# Patient Record
Sex: Female | Born: 1965 | Race: Black or African American | Hispanic: No | Marital: Single | Smoking: Never smoker
Health system: Southern US, Community
[De-identification: ages and names within clinical notes are randomized; demographics above are authoritative.]

## PROBLEM LIST (undated history)

## (undated) DIAGNOSIS — I10 Essential (primary) hypertension: Secondary | ICD-10-CM

## (undated) DIAGNOSIS — E119 Type 2 diabetes mellitus without complications: Secondary | ICD-10-CM

## (undated) DIAGNOSIS — I509 Heart failure, unspecified: Secondary | ICD-10-CM

## (undated) DIAGNOSIS — E079 Disorder of thyroid, unspecified: Secondary | ICD-10-CM

## (undated) DIAGNOSIS — R011 Cardiac murmur, unspecified: Secondary | ICD-10-CM

## (undated) DIAGNOSIS — I4891 Unspecified atrial fibrillation: Secondary | ICD-10-CM

## (undated) DIAGNOSIS — M329 Systemic lupus erythematosus, unspecified: Secondary | ICD-10-CM

## (undated) DIAGNOSIS — E0500 Thyrotoxicosis with diffuse goiter without thyrotoxic crisis or storm: Secondary | ICD-10-CM

## (undated) DIAGNOSIS — E039 Hypothyroidism, unspecified: Secondary | ICD-10-CM

## (undated) DIAGNOSIS — E0590 Thyrotoxicosis, unspecified without thyrotoxic crisis or storm: Secondary | ICD-10-CM

## (undated) DIAGNOSIS — D649 Anemia, unspecified: Secondary | ICD-10-CM

## (undated) DIAGNOSIS — IMO0002 Reserved for concepts with insufficient information to code with codable children: Secondary | ICD-10-CM

## (undated) HISTORY — PX: TONSILLECTOMY: SUR1361

## (undated) HISTORY — DX: Thyrotoxicosis with diffuse goiter without thyrotoxic crisis or storm: E05.00

## (undated) HISTORY — PX: ABLATION: SHX5711

---

## 2013-09-04 ENCOUNTER — Other Ambulatory Visit: Payer: Self-pay | Admitting: Oncology

## 2015-09-18 HISTORY — PX: COLONOSCOPY: SHX174

## 2020-04-03 HISTORY — PX: FOOT SURGERY: SHX648

## 2020-08-31 ENCOUNTER — Other Ambulatory Visit: Payer: Self-pay

## 2020-08-31 ENCOUNTER — Inpatient Hospital Stay (HOSPITAL_COMMUNITY)
Admission: EM | Admit: 2020-08-31 | Discharge: 2020-09-18 | DRG: 644 | Disposition: A | Discharge status: Skilled Nursing Facility | Payer: Medicaid Other | Attending: Internal Medicine | Admitting: Internal Medicine

## 2020-08-31 ENCOUNTER — Inpatient Hospital Stay (HOSPITAL_COMMUNITY): Payer: Medicaid Other

## 2020-08-31 ENCOUNTER — Emergency Department (HOSPITAL_COMMUNITY): Payer: Medicaid Other

## 2020-08-31 ENCOUNTER — Encounter (HOSPITAL_COMMUNITY): Payer: Self-pay | Admitting: *Deleted

## 2020-08-31 DIAGNOSIS — L03115 Cellulitis of right lower limb: Secondary | ICD-10-CM | POA: Diagnosis present

## 2020-08-31 DIAGNOSIS — L97919 Non-pressure chronic ulcer of unspecified part of right lower leg with unspecified severity: Secondary | ICD-10-CM | POA: Diagnosis not present

## 2020-08-31 DIAGNOSIS — E11621 Type 2 diabetes mellitus with foot ulcer: Secondary | ICD-10-CM | POA: Diagnosis present

## 2020-08-31 DIAGNOSIS — I350 Nonrheumatic aortic (valve) stenosis: Secondary | ICD-10-CM | POA: Diagnosis present

## 2020-08-31 DIAGNOSIS — I4891 Unspecified atrial fibrillation: Secondary | ICD-10-CM | POA: Diagnosis present

## 2020-08-31 DIAGNOSIS — A419 Sepsis, unspecified organism: Secondary | ICD-10-CM | POA: Diagnosis present

## 2020-08-31 DIAGNOSIS — E0591 Thyrotoxicosis, unspecified with thyrotoxic crisis or storm: Secondary | ICD-10-CM | POA: Diagnosis present

## 2020-08-31 DIAGNOSIS — G9340 Encephalopathy, unspecified: Secondary | ICD-10-CM | POA: Diagnosis present

## 2020-08-31 DIAGNOSIS — E0590 Thyrotoxicosis, unspecified without thyrotoxic crisis or storm: Secondary | ICD-10-CM | POA: Diagnosis present

## 2020-08-31 DIAGNOSIS — M869 Osteomyelitis, unspecified: Secondary | ICD-10-CM | POA: Diagnosis present

## 2020-08-31 DIAGNOSIS — Z79899 Other long term (current) drug therapy: Secondary | ICD-10-CM | POA: Diagnosis not present

## 2020-08-31 DIAGNOSIS — G9349 Other encephalopathy: Secondary | ICD-10-CM | POA: Diagnosis present

## 2020-08-31 DIAGNOSIS — Z7982 Long term (current) use of aspirin: Secondary | ICD-10-CM

## 2020-08-31 DIAGNOSIS — L089 Local infection of the skin and subcutaneous tissue, unspecified: Secondary | ICD-10-CM

## 2020-08-31 DIAGNOSIS — L03116 Cellulitis of left lower limb: Secondary | ICD-10-CM | POA: Diagnosis present

## 2020-08-31 DIAGNOSIS — E039 Hypothyroidism, unspecified: Secondary | ICD-10-CM | POA: Diagnosis present

## 2020-08-31 DIAGNOSIS — Z20822 Contact with and (suspected) exposure to covid-19: Secondary | ICD-10-CM | POA: Diagnosis present

## 2020-08-31 DIAGNOSIS — L97519 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: Secondary | ICD-10-CM | POA: Diagnosis present

## 2020-08-31 DIAGNOSIS — E1169 Type 2 diabetes mellitus with other specified complication: Secondary | ICD-10-CM | POA: Diagnosis present

## 2020-08-31 DIAGNOSIS — E0581 Other thyrotoxicosis with thyrotoxic crisis or storm: Secondary | ICD-10-CM | POA: Diagnosis not present

## 2020-08-31 DIAGNOSIS — E0501 Thyrotoxicosis with diffuse goiter with thyrotoxic crisis or storm: Secondary | ICD-10-CM | POA: Diagnosis not present

## 2020-08-31 DIAGNOSIS — M329 Systemic lupus erythematosus, unspecified: Secondary | ICD-10-CM | POA: Diagnosis present

## 2020-08-31 DIAGNOSIS — I5030 Unspecified diastolic (congestive) heart failure: Secondary | ICD-10-CM | POA: Diagnosis not present

## 2020-08-31 DIAGNOSIS — D696 Thrombocytopenia, unspecified: Secondary | ICD-10-CM | POA: Diagnosis not present

## 2020-08-31 DIAGNOSIS — L97921 Non-pressure chronic ulcer of unspecified part of left lower leg limited to breakdown of skin: Secondary | ICD-10-CM | POA: Diagnosis not present

## 2020-08-31 DIAGNOSIS — E871 Hypo-osmolality and hyponatremia: Secondary | ICD-10-CM | POA: Diagnosis not present

## 2020-08-31 DIAGNOSIS — L97929 Non-pressure chronic ulcer of unspecified part of left lower leg with unspecified severity: Secondary | ICD-10-CM | POA: Diagnosis not present

## 2020-08-31 DIAGNOSIS — I5032 Chronic diastolic (congestive) heart failure: Secondary | ICD-10-CM | POA: Diagnosis present

## 2020-08-31 DIAGNOSIS — I361 Nonrheumatic tricuspid (valve) insufficiency: Secondary | ICD-10-CM | POA: Diagnosis not present

## 2020-08-31 DIAGNOSIS — D61818 Other pancytopenia: Secondary | ICD-10-CM | POA: Diagnosis present

## 2020-08-31 DIAGNOSIS — M86172 Other acute osteomyelitis, left ankle and foot: Secondary | ICD-10-CM | POA: Diagnosis not present

## 2020-08-31 DIAGNOSIS — I878 Other specified disorders of veins: Secondary | ICD-10-CM | POA: Diagnosis present

## 2020-08-31 DIAGNOSIS — L97911 Non-pressure chronic ulcer of unspecified part of right lower leg limited to breakdown of skin: Secondary | ICD-10-CM | POA: Diagnosis not present

## 2020-08-31 DIAGNOSIS — L97211 Non-pressure chronic ulcer of right calf limited to breakdown of skin: Secondary | ICD-10-CM | POA: Diagnosis not present

## 2020-08-31 DIAGNOSIS — I340 Nonrheumatic mitral (valve) insufficiency: Secondary | ICD-10-CM | POA: Diagnosis not present

## 2020-08-31 DIAGNOSIS — I471 Supraventricular tachycardia: Secondary | ICD-10-CM | POA: Diagnosis present

## 2020-08-31 DIAGNOSIS — I110 Hypertensive heart disease with heart failure: Secondary | ICD-10-CM | POA: Diagnosis present

## 2020-08-31 DIAGNOSIS — D72819 Decreased white blood cell count, unspecified: Secondary | ICD-10-CM | POA: Diagnosis not present

## 2020-08-31 DIAGNOSIS — T148XXA Other injury of unspecified body region, initial encounter: Secondary | ICD-10-CM | POA: Diagnosis present

## 2020-08-31 DIAGNOSIS — I872 Venous insufficiency (chronic) (peripheral): Secondary | ICD-10-CM | POA: Diagnosis present

## 2020-08-31 DIAGNOSIS — L97529 Non-pressure chronic ulcer of other part of left foot with unspecified severity: Secondary | ICD-10-CM | POA: Diagnosis present

## 2020-08-31 DIAGNOSIS — D509 Iron deficiency anemia, unspecified: Secondary | ICD-10-CM | POA: Diagnosis not present

## 2020-08-31 DIAGNOSIS — D649 Anemia, unspecified: Secondary | ICD-10-CM | POA: Diagnosis not present

## 2020-08-31 HISTORY — DX: Heart failure, unspecified: I50.9

## 2020-08-31 HISTORY — DX: Type 2 diabetes mellitus without complications: E11.9

## 2020-08-31 HISTORY — DX: Unspecified atrial fibrillation: I48.91

## 2020-08-31 HISTORY — DX: Systemic lupus erythematosus, unspecified: M32.9

## 2020-08-31 HISTORY — DX: Essential (primary) hypertension: I10

## 2020-08-31 HISTORY — DX: Disorder of thyroid, unspecified: E07.9

## 2020-08-31 HISTORY — DX: Anemia, unspecified: D64.9

## 2020-08-31 HISTORY — DX: Reserved for concepts with insufficient information to code with codable children: IMO0002

## 2020-08-31 HISTORY — DX: Cardiac murmur, unspecified: R01.1

## 2020-08-31 LAB — RESP PANEL BY RT-PCR (FLU A&B, COVID) ARPGX2
Influenza A by PCR: NEGATIVE
Influenza B by PCR: NEGATIVE
SARS Coronavirus 2 by RT PCR: NEGATIVE

## 2020-08-31 LAB — CBC WITH DIFFERENTIAL/PLATELET
Abs Immature Granulocytes: 0.02 10*3/uL (ref 0.00–0.07)
Basophils Absolute: 0 10*3/uL (ref 0.0–0.1)
Basophils Relative: 0 %
Eosinophils Absolute: 0.1 10*3/uL (ref 0.0–0.5)
Eosinophils Relative: 2 %
HCT: 27.6 % — ABNORMAL LOW (ref 36.0–46.0)
Hemoglobin: 8 g/dL — ABNORMAL LOW (ref 12.0–15.0)
Immature Granulocytes: 0 %
Lymphocytes Relative: 22 %
Lymphs Abs: 1.3 10*3/uL (ref 0.7–4.0)
MCH: 24.2 pg — ABNORMAL LOW (ref 26.0–34.0)
MCHC: 29 g/dL — ABNORMAL LOW (ref 30.0–36.0)
MCV: 83.4 fL (ref 80.0–100.0)
Monocytes Absolute: 0.6 10*3/uL (ref 0.1–1.0)
Monocytes Relative: 11 %
Neutro Abs: 3.9 10*3/uL (ref 1.7–7.7)
Neutrophils Relative %: 65 %
Platelets: 191 10*3/uL (ref 150–400)
RBC: 3.31 MIL/uL — ABNORMAL LOW (ref 3.87–5.11)
RDW: 16.4 % — ABNORMAL HIGH (ref 11.5–15.5)
WBC: 5.9 10*3/uL (ref 4.0–10.5)
nRBC: 0 % (ref 0.0–0.2)

## 2020-08-31 LAB — COMPREHENSIVE METABOLIC PANEL
ALT: 25 U/L (ref 0–44)
AST: 51 U/L — ABNORMAL HIGH (ref 15–41)
Albumin: 2.8 g/dL — ABNORMAL LOW (ref 3.5–5.0)
Alkaline Phosphatase: 95 U/L (ref 38–126)
Anion gap: 9 (ref 5–15)
BUN: 11 mg/dL (ref 6–20)
CO2: 24 mmol/L (ref 22–32)
Calcium: 10.5 mg/dL — ABNORMAL HIGH (ref 8.9–10.3)
Chloride: 106 mmol/L (ref 98–111)
Creatinine, Ser: 0.6 mg/dL (ref 0.44–1.00)
GFR, Estimated: >60 mL/min (ref >60)
Glucose, Bld: 116 mg/dL — ABNORMAL HIGH (ref 70–99)
Potassium: 3.9 mmol/L (ref 3.5–5.1)
Sodium: 139 mmol/L (ref 135–145)
Total Bilirubin: 1.1 mg/dL (ref 0.3–1.2)
Total Protein: 7.9 g/dL (ref 6.5–8.1)

## 2020-08-31 LAB — BRAIN NATRIURETIC PEPTIDE: B Natriuretic Peptide: 1098.4 pg/mL — ABNORMAL HIGH (ref 0.0–100.0)

## 2020-08-31 LAB — I-STAT BETA HCG BLOOD, ED (MC, WL, AP ONLY): I-stat hCG, quantitative: <5 m[IU]/mL (ref <5)

## 2020-08-31 LAB — MAGNESIUM: Magnesium: 1.5 mg/dL — ABNORMAL LOW (ref 1.7–2.4)

## 2020-08-31 LAB — LACTIC ACID, PLASMA: Lactic Acid, Venous: 1.8 mmol/L (ref 0.5–1.9)

## 2020-08-31 LAB — TSH: TSH: <0.01 u[IU]/mL — ABNORMAL LOW (ref 0.350–4.500)

## 2020-08-31 LAB — T4, FREE: Free T4: 2.53 ng/dL — ABNORMAL HIGH (ref 0.61–1.12)

## 2020-08-31 MED ORDER — SODIUM CHLORIDE 0.9 % IV BOLUS
1000.0000 mL | Freq: Once | INTRAVENOUS | Status: AC
  Administered 2020-08-31: 1000 mL via INTRAVENOUS

## 2020-08-31 MED ORDER — MAGNESIUM SULFATE 2 GM/50ML IV SOLN
2.0000 g | Freq: Once | INTRAVENOUS | Status: AC
  Administered 2020-08-31: 2 g via INTRAVENOUS
  Filled 2020-08-31: qty 50

## 2020-08-31 MED ORDER — ENOXAPARIN SODIUM 40 MG/0.4ML ~~LOC~~ SOLN
40.0000 mg | Freq: Every day | SUBCUTANEOUS | Status: DC
  Administered 2020-08-31 – 2020-09-17 (×18): 40 mg via SUBCUTANEOUS
  Filled 2020-08-31 (×18): qty 0.4

## 2020-08-31 MED ORDER — DILTIAZEM HCL-DEXTROSE 125-5 MG/125ML-% IV SOLN (PREMIX)
5.0000 mg/h | INTRAVENOUS | Status: DC
  Administered 2020-08-31: 12:00:00 5 mg/h via INTRAVENOUS
  Administered 2020-09-01: 15 mg/h via INTRAVENOUS
  Filled 2020-08-31 (×5): qty 125

## 2020-08-31 MED ORDER — VANCOMYCIN HCL 1250 MG/250ML IV SOLN
1250.0000 mg | INTRAVENOUS | Status: DC
  Administered 2020-09-01 – 2020-09-04 (×4): 1250 mg via INTRAVENOUS
  Filled 2020-08-31 (×4): qty 250

## 2020-08-31 MED ORDER — LACTATED RINGERS IV SOLN: INTRAVENOUS | Status: AC

## 2020-08-31 MED ORDER — VANCOMYCIN HCL 1750 MG/350ML IV SOLN
1750.0000 mg | Freq: Once | INTRAVENOUS | Status: AC
  Administered 2020-08-31: 1750 mg via INTRAVENOUS
  Filled 2020-08-31: qty 350

## 2020-08-31 MED ORDER — DILTIAZEM LOAD VIA INFUSION
20.0000 mg | Freq: Once | INTRAVENOUS | Status: AC
  Administered 2020-08-31: 20 mg via INTRAVENOUS
  Filled 2020-08-31: qty 20

## 2020-08-31 MED ORDER — LORAZEPAM 2 MG/ML IJ SOLN
1.0000 mg | Freq: Once | INTRAMUSCULAR | Status: AC
  Administered 2020-08-31: 1 mg via INTRAVENOUS
  Filled 2020-08-31: qty 1

## 2020-08-31 MED ORDER — FENTANYL CITRATE (PF) 100 MCG/2ML IJ SOLN
50.0000 ug | Freq: Once | INTRAMUSCULAR | Status: AC
  Administered 2020-08-31: 50 ug via INTRAVENOUS
  Filled 2020-08-31: qty 2

## 2020-08-31 MED ORDER — MORPHINE SULFATE (PF) 4 MG/ML IV SOLN
4.0000 mg | Freq: Once | INTRAVENOUS | Status: AC
Start: 2020-08-31 — End: 2020-08-31
  Administered 2020-08-31: 4 mg via INTRAVENOUS
  Filled 2020-08-31: qty 1

## 2020-08-31 NOTE — Consult Note (Signed)
NAME:  Candace Robinson, MRN:  [id number], DOB:  [birth year], LOS: 0 ADMISSION DATE:  08/31/2020, CONSULTATION DATE:  08/31/2020 REFERRING MD:  Graciella Freer, CHIEF COMPLAINT:  ?thyroid storm?  History of Present Illness:  Candace Robinson is a 55 y.o. woman with past medical history of discoid lupus, chronic venous stasis. She is apparently chronically ill at baseline, but history is obtained from the sister over the phone.   Was living with mom in [place] who is elderly prior to this, but now living with sister in [place]. In November she was hospitalized at [facility] for 2-2.5 weeks. Apparently while inaptient she had episodes of hallucinations while at [facility]. No prior history of mental illness. Treated for wound care for her feet as well as infection.  Since then She was diagnosed recently with A. Fib as an outpatient in [place] and was also prescribed tramadol for her pain in her legs. Was following with [facility] - Dr. Marlon Pel PCP.   Sister has no idea if she was taking all her medications, or what those medications were. She does note difficulty walking, decreased appetite, delirium mixing up days and nights, seeing people in her room "man woman and child," in the room who weren't actually there, walking around the kitchen in the middle of the night turning burners on the stove.   Patient was admitted by IMTS but called for evaluation of possible thyroid storm when lab work resulted low TSH and elevated free T4. Sister denies family history of thyroid disorder.   Significant Hospital Events:    Consults:    Procedures:    Significant Diagnostic Tests:    Micro Data:  2/28 Blood cultures ngx12 hours covid negative Influenza negative  Antimicrobials:  Vancomycin  Interim History / Subjective:    Objective   Blood pressure (!) 126/97, pulse (!) 154, temperature 99.8 F (37.7 C), temperature source Oral, resp. rate (!) 32, height 5\' 3"  (1.6 m), weight 84.8 kg, SpO2  92 %.        Intake/Output Summary (Last 24 hours) at 08/31/2020 1907 Last data filed at 08/31/2020 1744 Gross per 24 hour  Intake 1401.14 ml  Output --  Net 1401.14 ml   Filed Weights   08/31/20 1042  Weight: 84.8 kg    Examination: General: chronically ill appearing, older than stated age HENT: alopecia with discoid lupus lesions Lungs: shallow inspiration, clear to auscultation anteriorly Cardiovascular: irregularly irregular Abdomen: scaphoid, soft Extremities: bilateral lower extremity venous stasis in bandages Neuro: awakens eyes to name, does not follow commands, lethargic  EKG personally reviewed - sinus tachycardia On telemetry this evening she is AF with RVR in the 140s  Chest xray 2/28 shows cardiomegaly  Labs: Na 139 K 3.9 Cr 0.6, BNP 1098 Lactic acid 1.8 WBC 5.9 Hgb 8.0 TSH< 0.010, Free T4 2.53  Resolved Hospital Problem list     Assessment & Plan:   Candace Robinson is a 55 y.o. woman with chronic medical issues who presents with:  Acute metabolic encephalopathy - hallucinations and psychosis at home, lethargic here - head ct reviewed and negative for acute hemorrhage - UDS pending - recommend obtaining some collateral information in the morning regarding her medications and medical history   Atrial Fibrillation with RVR I ordered an echocardiogram to evaluate for valvulopathy and heart failure Continue diltiazem drip for now Doubt shock, she is warm and wet on exam, no lactic acidosis, no hypotension, no end organ damage  Hyperthyroidism Her Free T4 elevation with TSH  suppression is most likely in the setting of sick euthyroid syndrome.  Thyrotoxicosis is in the differential but she is not having high grade fevers which are very commonly seen with this. However, encephalopathy can be seen with hyperthyroidism.  Chronic Venous Stasis  Wound care consultation MRI ordered to evaluate for osteomyelitis of left foot, based on xray read Reasonable to  continue antibiotics for now  I think she is appropriate for progressive care. Please call back CCM if we can be of further assistance or if there is a clinical change.   Candace Llamas, MD Pulmonary and [place] Pager: see AMION [phone]    Labs   CBC: Recent Labs  Lab 08/31/20 1102  WBC 5.9  NEUTROABS 3.9  HGB 8.0*  HCT 27.6*  MCV 83.4  PLT 174    Basic Metabolic Panel: Recent Labs  Lab 08/31/20 1102 08/31/20 1126  NA 139  --   K 3.9  --   CL 106  --   CO2 24  --   GLUCOSE 116*  --   BUN 11  --   CREATININE 0.60  --   CALCIUM 10.5*  --   MG  --  1.5*   GFR: Estimated Creatinine Clearance: 81.1 mL/min (by C-G formula based on SCr of 0.6 mg/dL). Recent Labs  Lab 08/31/20 1102  WBC 5.9  LATICACIDVEN 1.8    Liver Function Tests: Recent Labs  Lab 08/31/20 1102  AST 51*  ALT 25  ALKPHOS 95  BILITOT 1.1  PROT 7.9  ALBUMIN 2.8*   No results for input(s): LIPASE, AMYLASE in the last 168 hours. No results for input(s): AMMONIA in the last 168 hours.  ABG No results found for: PHART, PCO2ART, PO2ART, HCO3, TCO2, ACIDBASEDEF, O2SAT   Coagulation Profile: No results for input(s): INR, PROTIME in the last 168 hours.  Cardiac Enzymes: No results for input(s): CKTOTAL, CKMB, CKMBINDEX, TROPONINI in the last 168 hours.  HbA1C: No results found for: HGBA1C  CBG: No results for input(s): GLUCAP in the last 168 hours.  Review of Systems:   Unable to obtain due to mental status.   Past Medical History:  She,  has a past medical history of Anemia, Atrial fibrillation ([place]), CHF (congestive heart failure) ([place]), Diabetes mellitus without complication ([place]), Heart murmur, Hypertension, Lupus ([place]), and Thyroid disease.   Surgical History:  History reviewed. No pertinent surgical history.   Social History:   reports that she has never smoked. She has never used smokeless tobacco. She reports previous alcohol  use. She reports previous drug use.   Family History:  Her family history is not on file.   Allergies Allergies  Allergen Reactions  . Motrin [Ibuprofen]     Unknown     Home Medications  Prior to Admission medications   Medication Sig Start Date End Date Taking? Authorizing Provider  ASPIRIN LOW DOSE 81 MG EC tablet Take 81 mg by mouth daily. 05/13/20  Yes [provider]  diltiazem (TIAZAC) 120 MG 24 hr capsule Take 120 mg by mouth daily. 05/13/20  Yes [provider]  ferrous sulfate 325 (65 FE) MG tablet Take 325 mg by mouth in the morning and at bedtime.   Yes [provider]  pantoprazole (PROTONIX) 40 MG tablet Take 40 mg by mouth daily. 05/13/20  Yes [provider]  traMADol (ULTRAM) 50 MG tablet Take 50 mg by mouth every 6 (six) hours as needed for moderate pain.   Yes [provider]  zinc gluconate 50 MG tablet Take 50 mg by mouth daily.   Yes [provider]

## 2020-08-31 NOTE — Hospital Course (Addendum)
Past Medical History:  Diagnosis Date   Anemia    Atrial fibrillation (HCC)    CHF (congestive heart failure) (HCC)    Diabetes mellitus without complication (HCC)    Heart murmur    Hypertension    Lupus (HCC)    Thyroid disease    hyperthyroid   Current Meds  Medication Sig   ASPIRIN LOW DOSE 81 MG EC tablet Take 81 mg by mouth daily.   diltiazem (TIAZAC) 120 MG 24 hr capsule Take 120 mg by mouth daily.   ferrous sulfate 325 (65 FE) MG tablet Take 325 mg by mouth in the morning and at bedtime.   pantoprazole (PROTONIX) 40 MG tablet Take 40 mg by mouth daily.   traMADol (ULTRAM) 50 MG tablet Take 50 mg by mouth every 6 (six) hours as needed for moderate pain.   zinc gluconate 50 MG tablet Take 50 mg by mouth daily.    Diet Orders (From admission, onward)     Start     Ordered   08/31/20 1544  Diet NPO time specified  Diet effective now        08/31/20 1558            enoxaparin (LOVENOX) injection 40 mg Start: 08/31/20 1800             EKG showing sinus tachy in 137   Past Medical History:  Diagnosis Date   Anemia    Atrial fibrillation (HCC)    CHF (congestive heart failure) ([place])    Diabetes mellitus without complication ([place])    Heart murmur    Hypertension    Thyroid disease    hyperthyroid     Current Facility-Administered Medications:    [COMPLETED] diltiazem (CARDIZEM) 1 mg/mL load via infusion 20 mg, 20 mg, Intravenous, Once, 20 mg at 08/31/20 1144 **AND** diltiazem (CARDIZEM) 125 mg in dextrose 5% 125 mL (1 mg/mL) infusion, 5-15 mg/hr, Intravenous, Continuous, Ford, Kelsey N, PA-C, Last Rate: 5 mL/hr at 08/31/20 1142, 5 mg/hr at 08/31/20 1142   [START ON 09/01/2020] vancomycin (VANCOREADY) IVPB 1250 mg/250 mL, 1,250 mg, Intravenous, Q24H, von Dohlen, Haley B, RPH  Current Outpatient Medications:    ASPIRIN LOW DOSE 81 MG EC tablet, Take 81 mg by mouth daily., Disp: , Rfl:    diltiazem (TIAZAC) 120 MG 24 hr capsule, Take  120 mg by mouth daily., Disp: , Rfl:    ferrous sulfate 325 (65 FE) MG tablet, Take 325 mg by mouth in the morning and at bedtime., Disp: , Rfl:    pantoprazole (PROTONIX) 40 MG tablet, Take 40 mg by mouth daily., Disp: , Rfl:    traMADol (ULTRAM) 50 MG tablet, Take 50 mg by mouth every 6 (six) hours as needed for moderate pain., Disp: , Rfl:    zinc gluconate 50 MG tablet, Take 50 mg by mouth daily., Disp: , Rfl:    Current Meds  Medication Sig   ASPIRIN LOW DOSE 81 MG EC tablet Take 81 mg by mouth daily.   diltiazem (TIAZAC) 120 MG 24 hr capsule Take 120 mg by mouth daily.   ferrous sulfate 325 (65 FE) MG tablet Take 325 mg by mouth in the morning and at bedtime.   pantoprazole (PROTONIX) 40 MG tablet Take 40 mg by mouth daily.   traMADol (ULTRAM) 50 MG tablet Take 50 mg by mouth every 6 (six) hours as needed for moderate pain.   zinc  gluconate 50 MG tablet Take 50 mg by mouth daily.     Admitted for Afib with RVR - cardizem  Lower extremity wounds with fibrinous exudate. - vanco and BCx Altered mental status - Head CT

## 2020-08-31 NOTE — Progress Notes (Signed)
Pharmacy Antibiotic Note  Candace Robinson is a 55 y.o. female admitted on 08/31/2020 with purulent wound infection.  Pharmacy has been consulted for Vancomycin dosing. WBC wnl at 5.9, afebrile. SCr 0.6 on admit, baseline renal function unknown.  Plan: Vancomycin 1750 mg IV x 1; then 1250mg  IV q24h. Goal AUC 400-550. Expected AUC: 506 SCr used: 0.8 Monitor renal function, cultures/sensitivities, clinical progression Vancomycin levels as indicated  Height: 5\' 3"  (160 cm) Weight: 84.8 kg (187 lb) IBW/kg (Calculated) : 52.4  Temp (24hrs), Avg:98.8 F (37.1 C), Min:98.8 F (37.1 C), Max:98.8 F (37.1 C)  Recent Labs  Lab 08/31/20 1102  WBC 5.9  LATICACIDVEN 1.8    CrCl cannot be calculated (No successful lab value found.).    Allergies  Allergen Reactions  . Motrin [Ibuprofen]     Antimicrobials this admission: Vancomycin 2/28 >>   Dose adjustments this admission: N/A  Microbiology results: 2/28 BCx: pend   Arturo Morton, PharmD, BCPS Please check AMION for all [place] contact numbers Clinical Pharmacist 08/31/2020 12:01 PM

## 2020-08-31 NOTE — ED Provider Notes (Signed)
[facility] EMERGENCY DEPARTMENT Provider Note   CSN: [id number] Arrival date & time: 08/31/20  1033     History Chief Complaint  Patient presents with  . Altered Mental Status  . Recurrent Skin Infections    Candace Robinson is a 55 y.o. female.  Candace Robinson is a 55 y.o. female with a history of hypertension, diabetes, A. fib, GERD, venous stasis ulcers, heart failure, who presents via EMS for evaluation of worsening wounds to bilateral lower extremities and altered mental status.  History is extremely limited.  Patient lives with her sister, she is not currently present with patient and I have been unable to reach her.  Patient was hospitalized at [facility] in [place] of last year due to infections in her lower extremities.  Patient reports that she had a debridement and was on antibiotics, but reports since being released from the house at all they have continued to get worse.  She has been noting purulent drainage and foul odor from the feet and is unsure how long this drainage has been occurring.  Patient thinks that she last changed the dressings on her wounds about 2 weeks ago.  She reports some continued swelling in her legs.  She reports that her legs and feet have become increasingly painful.  She reports chills, unsure of fever.  Reports that she saw her primary care provider last week and was placed on an antibiotic but does not know what antibiotic, and cannot tell me what medication she has been treated with previously for these infections.  Patient appears very anxious, is unable to provide much additional history.  Denies chest pain, shortness of breath, abdominal pain, vomiting.  Repeatedly denies alcohol use or any substance abuse.  Patient is unsure what medication she is supposed to be taking.  Per EMS sister reported that the patient has been confused, heating ups wounds, and acting strangely.  Unable to speak with sister for more  information.        Past Medical History:  Diagnosis Date  . Anemia   . Atrial fibrillation ([place])   . CHF (congestive heart failure) ([place])   . Diabetes mellitus without complication ([place])   . Heart murmur   . Hypertension   . Thyroid disease    hyperthyroid    Patient Active Problem List   Diagnosis Date Noted  . Acute encephalopathy 08/31/2020    History reviewed. No pertinent surgical history.   OB History   No obstetric history on file.     No family history on file.  Social History   Tobacco Use  . Smoking status: Never Smoker  . Smokeless tobacco: Never Used  Vaping Use  . Vaping Use: Never used  Substance Use Topics  . Alcohol use: Not Currently  . Drug use: Not Currently    Home Medications Prior to Admission medications   Medication Sig Start Date End Date Taking? Authorizing Provider  ASPIRIN LOW DOSE 81 MG EC tablet Take 81 mg by mouth daily. 05/13/20  Yes [provider]  diltiazem (TIAZAC) 120 MG 24 hr capsule Take 120 mg by mouth daily. 05/13/20  Yes [provider]  ferrous sulfate 325 (65 FE) MG tablet Take 325 mg by mouth in the morning and at bedtime.   Yes [provider]  pantoprazole (PROTONIX) 40 MG tablet Take 40 mg by mouth daily. 05/13/20  Yes [provider]  traMADol (ULTRAM) 50 MG tablet Take 50 mg by mouth every 6 (six)  hours as needed for moderate pain.   Yes [provider]  zinc gluconate 50 MG tablet Take 50 mg by mouth daily.   Yes [provider]    Allergies    Motrin [ibuprofen]  Review of Systems   Review of Systems  Constitutional: Positive for chills. Negative for fever.  HENT: Negative.   Respiratory: Negative for cough and shortness of breath.   Cardiovascular: Positive for leg swelling. Negative for chest pain.  Gastrointestinal: Negative for abdominal pain.  Genitourinary: Negative for dysuria and frequency.  Musculoskeletal: Positive for myalgias.  Negative for arthralgias and joint swelling.  Skin: Positive for wound.  Neurological: Negative for dizziness, syncope and light-headedness.  Psychiatric/Behavioral: Positive for confusion.  All other systems reviewed and are negative.   Physical Exam Updated Vital Signs Pulse (!) 134   Temp 98.8 F (37.1 C) (Oral)   Resp 16   Ht 5\' 3"  (1.6 m)   Wt 84.8 kg   SpO2 99%   BMI 33.13 kg/m   Physical Exam Vitals and nursing note reviewed.  Constitutional:      General: She is not in acute distress.    Appearance: She is well-developed and well-nourished. She is not diaphoretic.     Comments: Alert, somewhat confused, ill-appearing, and appears very anxious, intermittently tearful  HENT:     Head: Normocephalic and atraumatic.     Mouth/Throat:     Mouth: Oropharynx is clear and moist.     Pharynx: Oropharynx is clear.     Comments: Mucous membranes slightly dry Eyes:     General:        Right eye: No discharge.        Left eye: No discharge.     Extraocular Movements: EOM normal.     Pupils: Pupils are equal, round, and reactive to light.  Cardiovascular:     Rate and Rhythm: Tachycardia present. Rhythm irregular.     Pulses: Intact distal pulses.          Dorsalis pedis pulses are detected w/ Doppler on the right side and detected w/ Doppler on the left side.     Heart sounds: Normal heart sounds.     Comments: On arrival patient tachycardic into the 180s-200s Pulmonary:     Effort: Pulmonary effort is normal. No respiratory distress.     Breath sounds: Normal breath sounds. No wheezing or rales.     Comments: Respirations equal and unlabored, patient able to speak in full sentences, lungs clear to auscultation bilaterally  Abdominal:     General: Bowel sounds are normal. There is no distension.     Palpations: Abdomen is soft. There is no mass.     Tenderness: There is no abdominal tenderness. There is no guarding.     Comments: Abdomen soft, nondistended, nontender to  palpation in all quadrants without guarding or peritoneal signs   Musculoskeletal:        General: No deformity or edema.     Cervical back: Neck supple.     Comments: Bilateral lower extremities with some edema.  Wounds present to the tops of both feet as well as the medial aspect of the heel and ankle bilaterally.  Dressings were removed to reveal purulent drainage, feet and legs are hot to the touch.  Extremely tender to palpation.  (See photos below) sensation intact.  Pulses confirmed with Doppler.  Skin:    General: Skin is warm and dry.     Capillary Refill: Capillary refill  takes less than 2 seconds.  Neurological:     Mental Status: She is alert.     Coordination: Coordination normal.     Comments: Patient is alert, oriented to self, intermittently oriented to place, confusion seems to wax and wane, speech is clear CN III-XII intact Normal strength in upper and lower extremities bilaterally including dorsiflexion and plantar flexion, strong and equal grip strength Sensation normal to light and sharp touch Moves extremities without ataxia, coordination intact   Psychiatric:        Mood and Affect: Mood is anxious.             ED Results / Procedures / Treatments   Labs (all labs ordered are listed, but only abnormal results are displayed) Labs Reviewed  COMPREHENSIVE METABOLIC PANEL - Abnormal; Notable for the following components:      Result Value   Glucose, Bld 116 (*)    Calcium 10.5 (*)    Albumin 2.8 (*)    AST 51 (*)    All other components within normal limits  CBC WITH DIFFERENTIAL/PLATELET - Abnormal; Notable for the following components:   RBC 3.31 (*)    Hemoglobin 8.0 (*)    HCT 27.6 (*)    MCH 24.2 (*)    MCHC 29.0 (*)    RDW 16.4 (*)    All other components within normal limits  MAGNESIUM - Abnormal; Notable for the following components:   Magnesium 1.5 (*)    All other components within normal limits  CULTURE, BLOOD (ROUTINE X 2)  CULTURE,  BLOOD (ROUTINE X 2)  RESP PANEL BY RT-PCR (FLU A&B, COVID) ARPGX2  URINE CULTURE  LACTIC ACID, PLASMA  LACTIC ACID, PLASMA  RAPID URINE DRUG SCREEN, HOSP PERFORMED  URINALYSIS, ROUTINE W REFLEX MICROSCOPIC  TSH  I-STAT BETA HCG BLOOD, ED (MC, WL, AP ONLY)    EKG EKG Interpretation  Date/Time:  Monday August 31 2020 10:41:27 EST Ventricular Rate:  137 PR Interval:    QRS Duration: 89 QT Interval:  295 QTC Calculation: 446 R Axis:   5 Text Interpretation: Sinus tachycardia Atrial premature complex RSR' in V1 or V2, right VCD or RVH Repol abnrm suggests ischemia, lateral leads agree, no old comparison Confirmed by Charlesetta Shanks [phone]) on 08/31/2020 12:12:26 PM   Radiology DG Chest Port 1 View  Result Date: 08/31/2020 CLINICAL DATA:  Sepsis, history CHF, diabetes mellitus, hypertension, atrial fibrillation EXAM: PORTABLE CHEST 1 VIEW COMPARISON:  Portable exam 1404 hours compared to 04/26/2020 FINDINGS: Enlargement of cardiac silhouette. Mediastinal contours and pulmonary vascularity normal. Lungs clear. No pulmonary infiltrate, pleural effusion, or pneumothorax. Osseous structures unremarkable. Atherosclerotic calcifications aorta noted. IMPRESSION: Enlargement of cardiac silhouette. No acute infiltrate. Aortic Atherosclerosis (ICD10-I70.0). Electronically Signed   By: Lavonia Dana M.D.   On: 08/31/2020 14:24    Procedures .Critical Care Performed by: Jacqlyn Larsen, PA-C Authorized by: Jacqlyn Larsen, PA-C   Critical care provider statement:    Critical care time (minutes):  45   Critical care was necessary to treat or prevent imminent or life-threatening deterioration of the following conditions:  Cardiac failure (Afib RVR)   Critical care was time spent personally by me on the following activities:  Discussions with consultants, evaluation of patient's response to treatment, examination of patient, ordering and performing treatments and interventions, ordering and review of  laboratory studies, ordering and review of radiographic studies, pulse oximetry, re-evaluation of patient's condition, obtaining history from patient or surrogate and review of old charts  Medications Ordered in ED Medications  diltiazem (CARDIZEM) 1 mg/mL load via infusion 20 mg (20 mg Intravenous Bolus from Bag 08/31/20 1144)    And  diltiazem (CARDIZEM) 125 mg in dextrose 5% 125 mL (1 mg/mL) infusion (5 mg/hr Intravenous New Bag/Given 08/31/20 1142)  vancomycin (VANCOREADY) IVPB 1250 mg/250 mL (has no administration in time range)  enoxaparin (LOVENOX) injection 40 mg (has no administration in time range)  lactated ringers infusion (has no administration in time range)  sodium chloride 0.9 % bolus 1,000 mL (1,000 mLs Intravenous New Bag/Given 08/31/20 1141)  fentaNYL (SUBLIMAZE) injection 50 mcg (50 mcg Intravenous Given 08/31/20 1139)  vancomycin (VANCOREADY) IVPB 1750 mg/350 mL (1,750 mg Intravenous New Bag/Given 08/31/20 1216)  morphine 4 MG/ML injection 4 mg (4 mg Intravenous Given 08/31/20 1307)  magnesium sulfate IVPB 2 g 50 mL (0 g Intravenous Stopped 08/31/20 1411)  LORazepam (ATIVAN) injection 1 mg (1 mg Intravenous Given 08/31/20 1419)    ED Course  I have reviewed the triage vital signs and the nursing notes.  Pertinent labs & imaging results that were available during my care of the patient were reviewed by me and considered in my medical decision making (see chart for details).    MDM Rules/Calculators/A&P                          55 year old female arrives via EMS, confused and ill during, tachycardic into the 180s-200s, initial EKG showing sinus tachycardia, but on monitor patient seems to be in A. fib.  She maintains normal blood pressure, is afebrile.  Has significant wounds to both feet that are foul-smelling with purulent drainage.  Suspect this is likely the source of infection that may have thrown patient into A. fib with RVR, history of A. fib with RVR previously when  she was hospitalized with infection, but patient is unsure what medication she takes for this.  Patient is critically ill, suspect altered mental status may likely be in the setting of infection, patient started on IV fluids, given bolus of diltiazem and continuous infusion for A. fib with RVR.  Also started on IV vancomycin for wound infection.  Labs initiated including lactic acid, blood cultures.  Pulses present in bilateral lower extremities with Doppler.  I have independently ordered, reviewed and interpreted all labs and imaging: CBC: Patient without leukocytosis despite concern for severe wound infection, hemoglobin of 8.0, unsure of patient's baseline, no prior labs available for comparison, patient is not currently having bleeding symptoms CMP: Glucose 116, calcium of 10.5 but no other significant electrolyte derangements, normal renal and liver function lactic acid: Not significantly elevated.  Chest x-ray with some cardiomegaly but no acute infiltrate or other active cardiopulmonary disease.  Patient has had some improvement in heart rate with diltiazem, heart rate now down into the 150s, but she remains tachycardic.  Lab work is overall been reassuring, she remains normotensive.  She has been given pain control.  She does appear quite anxious is tremulous and intermittently tearful, repeatedly denies alcohol use, states she has not drank in more than 20 years, so lower suspicion for alcohol withdrawal.  Will give dose of Ativan for anxiety.  We will also check TSH, UDS.  Patient will certainly require admission given continued A. fib with RVR, wound infection and altered mental status.  Consult placed for medicine admission.  Case discussed with internal medicine teaching service who will see and admit the patient, will check head CT given altered  mental status as well, no signs of head trauma on exam, but very limited pronation.  Final Clinical Impression(s) / ED Diagnoses Final  diagnoses:  Encephalopathy  Atrial fibrillation with RVR ([place])  Wound infection    Rx / DC Orders ED Discharge Orders    None       Janet Berlin 09/02/20 1153    Charlesetta Shanks, MD 09/02/20 1511

## 2020-08-31 NOTE — ED Triage Notes (Signed)
Pt here via [facility] from sister's home where she lives.  The sister said that the pt was trying to warm up a spoon - pt denies.  Pt has been tx for bil LE infection since Nov.  Pt shaking d/t pain and crying that she is "tired of pain".    VS 155/87 rr 24 ETCO2 43 CBG 119  RR 17  Given 500 NS e-route

## 2020-08-31 NOTE — ED Provider Notes (Incomplete)
I provided a substantive portion of the care of this patient.  I personally performed the entirety of the {Shared Choices:25152} for this encounter. {Remember to document shared critical care using "edcritical" dot phrase:1} EKG Interpretation  Date/Time:  Monday August 31 2020 10:41:27 EST Ventricular Rate:  137 PR Interval:    QRS Duration: 89 QT Interval:  295 QTC Calculation: 446 R Axis:   5 Text Interpretation: Sinus tachycardia Atrial premature complex RSR' in V1 or V2, right VCD or RVH Repol abnrm suggests ischemia, lateral leads agree, no old comparison Confirmed by Charlesetta Shanks [phone]) on 08/31/2020 12:12:26 PM

## 2020-08-31 NOTE — Progress Notes (Signed)
Pharmacy Antibiotic Note  Candace Robinson is a 55 y.o. female admitted on 08/31/2020 with purulent wound infection.  Pharmacy has been consulted for Vancomycin dosing. WBC wnl at 5.9, afebrile. CMET in process, baseline renal function unknown.  Plan: Vancomycin 1750 mg IV once Follow-up CMET for maintenance doses Monitor renal function, cultures/sensitivities, clinical progression Vancomycin levels as indicated  Height: 5\' 3"  (160 cm) Weight: 84.8 kg (187 lb) IBW/kg (Calculated) : 52.4  Temp (24hrs), Avg:98.8 F (37.1 C), Min:98.8 F (37.1 C), Max:98.8 F (37.1 C)  Recent Labs  Lab 08/31/20 1102  WBC 5.9    CrCl cannot be calculated (No successful lab value found.).    Allergies  Allergen Reactions  . Motrin [Ibuprofen]     Antimicrobials this admission: Vancomycin 2/28 >>   Dose adjustments this admission: N/A  Microbiology results: 2/28 BCx: pend  Richardine Service, PharmD, [place] PGY2 Cardiology Pharmacy Resident Phone: [phone] 08/31/2020  11:39 AM  Please check [email or web address] for unit-specific pharmacy phone numbers.

## 2020-08-31 NOTE — H&P (Cosign Needed Addendum)
Date: 08/31/2020               Patient Name:  Candace Robinson MRN: [id number]  DOB: 15-Feb-1966 Age / Sex: 55 y.o., female   PCP: Patient, No Pcp Per         Medical Service: Internal Medicine Teaching Service         Attending Physician: Dr. Sid Falcon, MD    First Contact: Dr. Johnney Ou Pager: [phone]  Second Contact: Dr. Marianna Payment Pager: [phone]       After Hours (After 5p/  First Contact Pager: [phone]  weekends / holidays): Second Contact Pager: [phone]   Chief Complaint: bilateral leg wounds  History of Present Illness: Candace Robinson is 55yo person with congestive heart failure (no previous Echo on record), atrial fibrillation off anticoagulation, hypertension, type II diabetes mellitus, venous insufficiency presenting to [facility] 2/28 with bilateral lower extremity ulcers. History is extremely limited given patient's mental status and lack of medical records. Most of the history obtained via patient's sister. Patient was admitted to [facility] for infection from these ulcers in November. After discharge, patient started new PCP in [place]. She was then found to have atrial fibrillation in December and subsequently started on medication, unclear which one. Over the last several weeks, patient's lower extremity wounds have worsened. Patient was prescribed Keflex with minimal improvement. Over this last weekend, patient developed worsening altered mental status, including turning the stove on inappropriately, staying up all night, and having hallucinations.   Meds: Current Meds  Medication Sig  . ASPIRIN LOW DOSE 81 MG EC tablet Take 81 mg by mouth daily.  Marland Kitchen diltiazem (TIAZAC) 120 MG 24 hr capsule Take 120 mg by mouth daily.  . ferrous sulfate 325 (65 FE) MG tablet Take 325 mg by mouth in the morning and at bedtime.  . pantoprazole (PROTONIX) 40 MG tablet Take 40 mg by mouth daily.  . traMADol (ULTRAM) 50 MG tablet Take 50 mg by mouth every 6 (six) hours as needed for  moderate pain.  Marland Kitchen zinc gluconate 50 MG tablet Take 50 mg by mouth daily.   Allergies: Allergies as of 08/31/2020 - Review Complete 08/31/2020  Allergen Reaction Noted  . Motrin [ibuprofen]  08/31/2020   Past Medical History:  Diagnosis Date  . Anemia   . Atrial fibrillation ([place])   . CHF (congestive heart failure) ([place])   . Diabetes mellitus without complication ([place])   . Heart murmur   . Hypertension   . Thyroid disease    hyperthyroid   Family History:  No family history available.  Social History:  Patient lives with a family member in [place]. Occupation unclear, possibly multiple part-time jobs. Denies tobacco, alcohol, or recreational drug use.  Review of Systems: A complete ROS was negative except as per HPI.   Physical Exam: Blood pressure (!) 124/91, pulse (!) 55, temperature 98.8 F (37.1 C), temperature source Oral, resp. rate (!) 30, height '5\' 3"'  (1.6 m), weight 84.8 kg, SpO2 (!) 60 %. General: Visibly shaking, laying in bed HENT: Normocephalic, atraumatic. Dry mucous membranes. Eyes: Normal lids. No scleral icterus. Vision grossly in tact CV: Tachycardic, irregular rhythm. No murmurs, gallops, rubs appreciated Pulm: Normal work of breathing. Clear to auscultation bilaterally. No rales, wheezing, rhonchi GI: Soft, non-distended, non-tender. Normoactive bowel sounds. MSK: Pitting edema present 2+ to hips bilaterally. Normal bulk, tone. BLE warm. Skin: Numerous ulcerations on bilateral feet. Venous stasis dermatitis present bilaterally. Neuro: Awake, alert, oriented to person only. No  focal deficits Psych: Tangential speech, difficult to re-direct  CBC Latest Ref Rng & Units 08/31/2020  WBC 4.0 - 10.5 K/uL 5.9  Hemoglobin 12.0 - 15.0 g/dL 8.0(L)  Hematocrit 36.0 - 46.0 % 27.6(L)  Platelets 150 - 400 K/uL 191   CMP Latest Ref Rng & Units 08/31/2020  Glucose 70 - 99 mg/dL 116(H)  BUN 6 - 20 mg/dL 11  Creatinine 0.44 - 1.00 mg/dL 0.60  Sodium 135 - 145  mmol/L 139  Potassium 3.5 - 5.1 mmol/L 3.9  Chloride 98 - 111 mmol/L 106  CO2 22 - 32 mmol/L 24  Calcium 8.9 - 10.3 mg/dL 10.5(H)  Total Protein 6.5 - 8.1 g/dL 7.9  Total Bilirubin 0.3 - 1.2 mg/dL 1.1  Alkaline Phos 38 - 126 U/L 95  AST 15 - 41 U/L 51(H)  ALT 0 - 44 U/L 25   EKG: personally reviewed my interpretation is sinus tachycardia, left axis deviation  CXR: personally reviewed my interpretation is no lobar consolidation  Assessment & Plan by Problem: Candace Robinson is 55yo person congestive heart failure (no previous Echo on record), atrial fibrillation not on anticoagulation, hypertension, type II diabetes mellitus, venous insufficiency admitted 2/28 with sepsis w/ acute encephalopathy, likely due to bilateral lower extremity ulcers.  Active Problems:   Acute encephalopathy   Sepsis ([place])  #Sepsis, likely source bilateral extremity venous ulcers #Acute encephalopathy #Tachycardia with hx atrial fibrillation #Low TSH On arrival to ED, patient afebrile, tachycardic, normotensive sating well on room air. While in ED, patient given 1L IVF, started on diltiazem drip, and empirically started on vancomycin. Initial ECG revealed sinus tachycardia, repeat pending. On admission exam, patient encephalopathic, only oriented to person with tangential speech, which is different than baseline per family. CT head negative, no hx drug or alcohol use. Patient meets 2/4 SIRS criteria, high risk qSOFA.  XR bilateral feet showed no osteomyelitis on R foot, possible osteo on L. Pending MRI. BCx, UCx drawn. CXR without concern for pneumonia. Plan to aggressively hydrate with IVF, although patient does have remote history of CHF. No previous records on last Echo or previous EF. Most likely tachycardic due to current infection. Initial ECG w/ sinus tachycardia, pending repeat ECG. Patient does have history of atrial fibrillation, not on anticoagulation at home. Currently on diltiazem drip, can consider  inotropic agent if concern for cardiogenic shock. Given patient's low TSH and remote history of thyroid disease, cannot rule-out thyroid storm, pending further studies. Likely patient has some degree of peripheral vascular disease, can consider ABI's once more stable. - C/w diltiazem gtt, watch for signs of cardiogenic shock - LR @ 100cc/hr x12hr - C/w vancomycin, low threshold to add antibiotic if no improvement - F/u BCx, UCx - F/u MRI bilateral feet - F/u lactic, free T4, T3, ESR, CRP - F/u repeat ECG - Holding home medications - SLP eval - PT/OT - Delirium precautions     #Hx CHF Patient reportedly has remote history of CHF, unclear on EF or type of heart failure. Currently unable to obtain Echo given tachycardia. On exam, patient with pitting edema to hips. Unclear on patient's home medications, does not appear to be on GDMT. BNP pending. Currently on diltiazem gtt, can consider digoxin if concern for cardiogenic shock arises. Will continue with aggressive fluid hydration given sepsis for now. - F/u BNP - Echo pending rate control  #Normocytic anemia, chronicity unknown #Thrombocytopenia No baseline labs available. H/H 8.0/27.6 w/ MCV 83. No obvious signs of bleeding on exam. Will  start anemia work-up. - F/u iron studies, B12, folate, reticulocyte count - Daily CBC  #Hx hypertension Remote history of hypertension, not on antihypertensives at home. Will continue to monitor and titrate as needed.  Dispo: Admit patient to Inpatient with expected length of stay greater than 2 midnights.  Signed: Sanjuan Dame, MD 08/31/2020, 2:47 PM  Pager: [phone] After 5pm on weekdays and 1pm on weekends: On Call pager: [phone]

## 2020-09-01 ENCOUNTER — Inpatient Hospital Stay (HOSPITAL_COMMUNITY): Payer: Medicaid Other

## 2020-09-01 DIAGNOSIS — L97929 Non-pressure chronic ulcer of unspecified part of left lower leg with unspecified severity: Secondary | ICD-10-CM | POA: Diagnosis present

## 2020-09-01 DIAGNOSIS — I361 Nonrheumatic tricuspid (valve) insufficiency: Secondary | ICD-10-CM

## 2020-09-01 DIAGNOSIS — M86172 Other acute osteomyelitis, left ankle and foot: Secondary | ICD-10-CM

## 2020-09-01 DIAGNOSIS — I471 Supraventricular tachycardia: Secondary | ICD-10-CM

## 2020-09-01 DIAGNOSIS — G9340 Encephalopathy, unspecified: Secondary | ICD-10-CM

## 2020-09-01 DIAGNOSIS — I4891 Unspecified atrial fibrillation: Secondary | ICD-10-CM

## 2020-09-01 DIAGNOSIS — I340 Nonrheumatic mitral (valve) insufficiency: Secondary | ICD-10-CM

## 2020-09-01 DIAGNOSIS — L97919 Non-pressure chronic ulcer of unspecified part of right lower leg with unspecified severity: Secondary | ICD-10-CM | POA: Diagnosis present

## 2020-09-01 DIAGNOSIS — I350 Nonrheumatic aortic (valve) stenosis: Secondary | ICD-10-CM

## 2020-09-01 DIAGNOSIS — L03115 Cellulitis of right lower limb: Secondary | ICD-10-CM

## 2020-09-01 DIAGNOSIS — E0591 Thyrotoxicosis, unspecified with thyrotoxic crisis or storm: Secondary | ICD-10-CM | POA: Diagnosis present

## 2020-09-01 DIAGNOSIS — L97911 Non-pressure chronic ulcer of unspecified part of right lower leg limited to breakdown of skin: Secondary | ICD-10-CM

## 2020-09-01 DIAGNOSIS — A419 Sepsis, unspecified organism: Secondary | ICD-10-CM

## 2020-09-01 DIAGNOSIS — E0590 Thyrotoxicosis, unspecified without thyrotoxic crisis or storm: Secondary | ICD-10-CM | POA: Diagnosis present

## 2020-09-01 DIAGNOSIS — L97921 Non-pressure chronic ulcer of unspecified part of left lower leg limited to breakdown of skin: Secondary | ICD-10-CM

## 2020-09-01 DIAGNOSIS — E11621 Type 2 diabetes mellitus with foot ulcer: Secondary | ICD-10-CM

## 2020-09-01 LAB — CBC
HCT: 23.9 % — ABNORMAL LOW (ref 36.0–46.0)
Hemoglobin: 7.3 g/dL — ABNORMAL LOW (ref 12.0–15.0)
MCH: 24.7 pg — ABNORMAL LOW (ref 26.0–34.0)
MCHC: 30.5 g/dL (ref 30.0–36.0)
MCV: 81 fL (ref 80.0–100.0)
Platelets: 188 10*3/uL (ref 150–400)
RBC: 2.95 MIL/uL — ABNORMAL LOW (ref 3.87–5.11)
RDW: 16.4 % — ABNORMAL HIGH (ref 11.5–15.5)
WBC: 10.2 10*3/uL (ref 4.0–10.5)
nRBC: 0 % (ref 0.0–0.2)

## 2020-09-01 LAB — ECHOCARDIOGRAM COMPLETE
AR max vel: 1.75 cm2
AV Area VTI: 1.72 cm2
AV Area mean vel: 1.92 cm2
AV Mean grad: 21.5 mmHg
AV Peak grad: 41 mmHg
Ao pk vel: 3.2 m/s
Area-P 1/2: 3.99 cm2
Height: 63 in
MV VTI: 2.43 cm2
S' Lateral: 3.6 cm
Weight: 2992 oz

## 2020-09-01 LAB — COMPREHENSIVE METABOLIC PANEL
ALT: 25 U/L (ref 0–44)
ALT: 28 U/L (ref 0–44)
AST: 54 U/L — ABNORMAL HIGH (ref 15–41)
AST: 58 U/L — ABNORMAL HIGH (ref 15–41)
Albumin: 2.3 g/dL — ABNORMAL LOW (ref 3.5–5.0)
Albumin: 2.3 g/dL — ABNORMAL LOW (ref 3.5–5.0)
Alkaline Phosphatase: 79 U/L (ref 38–126)
Alkaline Phosphatase: 75 U/L (ref 38–126)
Anion gap: 9 (ref 5–15)
Anion gap: 8 (ref 5–15)
BUN: 11 mg/dL (ref 6–20)
BUN: 12 mg/dL (ref 6–20)
CO2: 23 mmol/L (ref 22–32)
CO2: 23 mmol/L (ref 22–32)
Calcium: 10.2 mg/dL (ref 8.9–10.3)
Calcium: 10 mg/dL (ref 8.9–10.3)
Chloride: 109 mmol/L (ref 98–111)
Chloride: 107 mmol/L (ref 98–111)
Creatinine, Ser: 0.64 mg/dL (ref 0.44–1.00)
Creatinine, Ser: 0.61 mg/dL (ref 0.44–1.00)
GFR, Estimated: >60 mL/min (ref >60)
GFR, Estimated: >60 mL/min (ref >60)
Glucose, Bld: 117 mg/dL — ABNORMAL HIGH (ref 70–99)
Glucose, Bld: 138 mg/dL — ABNORMAL HIGH (ref 70–99)
Potassium: 4.2 mmol/L (ref 3.5–5.1)
Potassium: 3.6 mmol/L (ref 3.5–5.1)
Sodium: 141 mmol/L (ref 135–145)
Sodium: 138 mmol/L (ref 135–145)
Total Bilirubin: 1.1 mg/dL (ref 0.3–1.2)
Total Bilirubin: 1.2 mg/dL (ref 0.3–1.2)
Total Protein: 7.1 g/dL (ref 6.5–8.1)
Total Protein: 7.1 g/dL (ref 6.5–8.1)

## 2020-09-01 LAB — RAPID URINE DRUG SCREEN, HOSP PERFORMED
Amphetamines: NOT DETECTED
Barbiturates: NOT DETECTED
Benzodiazepines: NOT DETECTED
Cocaine: NOT DETECTED
Opiates: POSITIVE — AB
Tetrahydrocannabinol: NOT DETECTED

## 2020-09-01 LAB — MRSA PCR SCREENING: MRSA by PCR: NEGATIVE

## 2020-09-01 LAB — IRON AND TIBC
Iron: 12 ug/dL — ABNORMAL LOW (ref 28–170)
Saturation Ratios: 8 % — ABNORMAL LOW (ref 10.4–31.8)
TIBC: 150 ug/dL — ABNORMAL LOW (ref 250–450)
UIBC: 138 ug/dL

## 2020-09-01 LAB — HEMOGLOBIN A1C
Hgb A1c MFr Bld: 5.6 % (ref 4.8–5.6)
Mean Plasma Glucose: 114.02 mg/dL

## 2020-09-01 LAB — URINALYSIS, ROUTINE W REFLEX MICROSCOPIC
Bacteria, UA: NONE SEEN
Bilirubin Urine: NEGATIVE
Glucose, UA: NEGATIVE mg/dL
Ketones, ur: 5 mg/dL — AB
Leukocytes,Ua: NEGATIVE
Nitrite: NEGATIVE
Protein, ur: NEGATIVE mg/dL
Specific Gravity, Urine: 1.017 (ref 1.005–1.030)
pH: 5 (ref 5.0–8.0)

## 2020-09-01 LAB — LIPID PANEL
Cholesterol: 77 mg/dL (ref 0–200)
HDL: 20 mg/dL — ABNORMAL LOW (ref >40)
LDL Cholesterol: 47 mg/dL (ref 0–99)
Total CHOL/HDL Ratio: 3.9 RATIO
Triglycerides: 52 mg/dL (ref <150)
VLDL: 10 mg/dL (ref 0–40)

## 2020-09-01 LAB — MAGNESIUM: Magnesium: 1.7 mg/dL (ref 1.7–2.4)

## 2020-09-01 LAB — RETICULOCYTES
Immature Retic Fract: 22.4 % — ABNORMAL HIGH (ref 2.3–15.9)
RBC.: 2.9 MIL/uL — ABNORMAL LOW (ref 3.87–5.11)
Retic Count, Absolute: 42.9 10*3/uL (ref 19.0–186.0)
Retic Ct Pct: 1.5 % (ref 0.4–3.1)

## 2020-09-01 LAB — FERRITIN: Ferritin: 185 ng/mL (ref 11–307)

## 2020-09-01 LAB — T3: T3, Total: 549 ng/dL — ABNORMAL HIGH (ref 71–180)

## 2020-09-01 LAB — SEDIMENTATION RATE: Sed Rate: 69 mm/hr — ABNORMAL HIGH (ref 0–22)

## 2020-09-01 LAB — C-REACTIVE PROTEIN: CRP: 3.5 mg/dL — ABNORMAL HIGH (ref <1.0)

## 2020-09-01 LAB — HIV ANTIBODY (ROUTINE TESTING W REFLEX): HIV Screen 4th Generation wRfx: NONREACTIVE

## 2020-09-01 LAB — FOLATE: Folate: 4.5 ng/mL — ABNORMAL LOW (ref >5.9)

## 2020-09-01 LAB — VITAMIN B12: Vitamin B-12: 449 pg/mL (ref 180–914)

## 2020-09-01 LAB — LACTIC ACID, PLASMA: Lactic Acid, Venous: 1.2 mmol/L (ref 0.5–1.9)

## 2020-09-01 MED ORDER — CALCITONIN (SALMON) 200 UNIT/ML IJ SOLN: 4.0000 [IU]/kg | Freq: Once | INTRAMUSCULAR | Status: DC

## 2020-09-01 MED ORDER — PROPYLTHIOURACIL 50 MG PO TABS
200.0000 mg | ORAL_TABLET | Freq: Three times a day (TID) | ORAL | Status: DC
  Administered 2020-09-01 – 2020-09-03 (×6): 200 mg via ORAL
  Filled 2020-09-01 (×9): qty 4

## 2020-09-01 MED ORDER — CALCITONIN (SALMON) 200 UNIT/ML IJ SOLN
4.0000 [IU]/kg | Freq: Once | INTRAMUSCULAR | Status: AC
  Administered 2020-09-01: 340 [IU] via INTRAMUSCULAR
  Filled 2020-09-01: qty 1.7

## 2020-09-01 MED ORDER — FOLIC ACID 5 MG/ML IJ SOLN
1.0000 mg | Freq: Every day | INTRAMUSCULAR | Status: DC
  Administered 2020-09-01 – 2020-09-02 (×2): 1 mg via INTRAVENOUS
  Filled 2020-09-01 (×3): qty 0.2

## 2020-09-01 MED ORDER — MAGNESIUM SULFATE 2 GM/50ML IV SOLN
2.0000 g | Freq: Once | INTRAVENOUS | Status: AC
  Administered 2020-09-01: 2 g via INTRAVENOUS
  Filled 2020-09-01: qty 50

## 2020-09-01 MED ORDER — PROPRANOLOL HCL 20 MG/5ML PO SOLN
40.0000 mg | Freq: Four times a day (QID) | ORAL | Status: DC
  Administered 2020-09-01 – 2020-09-04 (×11): 40 mg via ORAL
  Filled 2020-09-01 (×15): qty 10

## 2020-09-01 MED ORDER — POTASSIUM IODIDE (EXPECTORANT) 1 GM/ML PO SOLN
5.0000 [drp] | Freq: Four times a day (QID) | ORAL | Status: DC
  Administered 2020-09-01 – 2020-09-03 (×9): 250 mg via ORAL
  Filled 2020-09-01 (×10): qty 0.25

## 2020-09-01 MED ORDER — LACTATED RINGERS IV SOLN: INTRAVENOUS | Status: DC

## 2020-09-01 NOTE — Progress Notes (Signed)
MRI was attempted. Pt unable to hold still for exam. MD did not want to medicate at this time. Patient sent back to unit.

## 2020-09-01 NOTE — Progress Notes (Signed)
  Echocardiogram 2D Echocardiogram has been performed.  Candace Robinson 09/01/2020, 9:12 AM

## 2020-09-01 NOTE — Progress Notes (Signed)
Attempted to see pt for OT.  Pt with active bedrest order.  Will await activity orders and then proceed with OT eval as schedule allows.  Jinger Neighbors, [phone]

## 2020-09-01 NOTE — Progress Notes (Signed)
PT Cancellation Note  Patient Details Name: Candace Robinson MRN: [id number] DOB: 11/14/1965   Cancelled Treatment:    Reason Eval/Treat Not Completed: Active bedrest order Will await increase in activity orders prior to PT evaluation. Will follow.   Marguarite Arbour A Hartshorne 09/01/2020, 7:34 AM Marisa Severin, PT, DPT Acute Rehabilitation Services Pager [phone] Office [phone]

## 2020-09-01 NOTE — Progress Notes (Signed)
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 rate 20, height 5\' 3"  (1.6 m), weight 84.8 kg, SpO2 94 %.        Intake/Output Summary (Last 24 hours) at 09/01/2020 0109 Last data filed at 09/01/2020 0700 Gross per 24 hour  Intake 1401.14 ml  Output 250 ml  Net 1151.14 ml   Filed Weights   08/31/20 1042  Weight: 84.8 kg    Examination:  General:  Resting comfortably in bed, tachypnea but no accessory muscle use, speaking in full sentences HENT: NCAT OP clear PULM: CTA B, normal effort CV: RRR, no mgr GI: BS+, soft, nontender MSK: normal bulk and tone Neuro: awake, alert, no distress, MAEW   Resolved Hospital Problem list     Assessment & Plan:  Hallucinations with tachycardia, tachypnea> broad differential diagnosis including hyperthyroid related symptoms, primary psychiatric issue, other intoxication or drug use, organic brain issue (lupus cerebritis)> improved this morning.  She tells me she has been on thyroid medication in the past > doesn't need ICU at this time > would try to get a medication history to see if she has been taking thyroid replacement > if no evidence of thyroid replacement then would initiate work up and treatment for  hyperthyroidism  Rest as per IMTS service  PCCM available PRN  Best practice (evaluated daily)   Per IMTS  Goals of Care:   Per IMTS  Labs   CBC: Recent Labs  Lab 08/31/20 1102 09/01/20 0342  WBC 5.9 10.2  NEUTROABS 3.9  --   HGB 8.0* 7.3*  HCT 27.6* 23.9*  MCV 83.4 81.0  PLT 191 323    Basic Metabolic Panel: Recent Labs  Lab 08/31/20 1102 08/31/20 1126 09/01/20 0342  NA 139  --  141  K 3.9  --  4.2  CL 106  --  109  CO2 24  --  23  GLUCOSE 116*  --  117*  BUN 11  --  11  CREATININE 0.60  --  0.64  CALCIUM 10.5*  --  10.2  MG  --  1.5* 1.7   GFR: Estimated Creatinine Clearance: 81.1 mL/min (by C-G formula based on SCr of 0.64 mg/dL). Recent Labs  Lab 08/31/20 1102 09/01/20 0342  WBC 5.9 10.2  LATICACIDVEN 1.8 1.2    Liver Function Tests: Recent Labs  Lab 08/31/20 1102 09/01/20 0342  AST 51* 54*  ALT 25 25  ALKPHOS 95 79  BILITOT 1.1 1.1  PROT 7.9 7.1  ALBUMIN 2.8* 2.3*   No results for input(s): LIPASE, AMYLASE in the last 168 hours. No results for input(s): AMMONIA in the last 168 hours.  ABG No results found for: PHART, PCO2ART, PO2ART, HCO3, TCO2, ACIDBASEDEF, O2SAT   Coagulation Profile: No results for  input(s): INR, PROTIME in the last 168 hours.  Cardiac Enzymes: No results for input(s): CKTOTAL, CKMB, CKMBINDEX, TROPONINI in the last 168 hours.  HbA1C: Hgb A1c MFr Bld  Date/Time Value Ref Range Status  09/01/2020 03:42 AM 5.6 4.8 - 5.6 % Final    Comment:    (NOTE) Pre diabetes:          5.7%-6.4%  Diabetes:              >6.4%  Glycemic control for   <7.0% adults with diabetes     CBG: No results for input(s): GLUCAP in the last 168 hours.  Critical care time: n/a     Roselie Awkward, MD [facility] Pager: [phone] Cell: [phone] If no response, please call [phone] until 7pm After 7:00 pm call Elink  [phone]

## 2020-09-01 NOTE — Progress Notes (Signed)
HD#1 Subjective:  Overnight Events: Mg 1.7, IV mag given. Folate 4.5, IV folic acid started.  On evaluation today patient remains disoriented, although improved since admission.  She continues have fluctuations in her mentation with lucid intervals followed by periods of confusion and disorientation.  Patient remains tremulous with warm extremities.  Otherwise patient not expressed any acute problems today.  Objective:  Vital signs in last 24 hours: Vitals:   09/01/20 0100 09/01/20 0200 09/01/20 0300 09/01/20 0400  BP: 114/71 109/64 114/60 111/62  Pulse: (!) 109 (!) 109 (!) 108 (!) 109  Resp: (!) 33 (!) 31 (!) 28 (!) 35  Temp:    98.3 F (36.8 C)  TempSrc:    Oral  SpO2: 95% 94% 97% 93%  Weight:      Height:       Supplemental O2: Nasal Cannula SpO2: 93 % O2 Flow Rate (L/min): 3 L/min   Physical Exam:  Constitutional: Ill-appearing female, fluctuations in orientation no acute distress  HENT: normocephalic atraumatic, mucous membranes moist Eyes: conjunctiva non-erythematous Neck: supple Cardiovascular: Tachycardic with murmur difficult to appreciate due to rate. Pulmonary/Chest: Tachypnea otherwise no respiratory distress nor does she have inappropriate lung sounds Abdominal: soft, non-tender, non-distended MSK: normal bulk and tone Neurological: Fluctuations in alertness and orientation with no focal neurologic deficits Skin: Patient has warm extremities mildly diaphoretic with significant lower extremity wounds.  She also has significant lower extremity edema up to her waist.  2+ pitting edema bilaterally in her lower extremities. Psych: Disoriented, sleepy  Filed Weights   08/31/20 1042  Weight: 84.8 kg     Intake/Output Summary (Last 24 hours) at 09/01/2020 0555 Last data filed at 08/31/2020 1744 Gross per 24 hour  Intake 1401.14 ml  Output --  Net 1401.14 ml   Net IO Since Admission: 1,401.14 mL [09/01/20 0555]  Pertinent Labs: CBC Latest Ref Rng & Units  09/01/2020 08/31/2020  WBC 4.0 - 10.5 K/uL 10.2 5.9  Hemoglobin 12.0 - 15.0 g/dL 7.3(L) 8.0(L)  Hematocrit 36.0 - 46.0 % 23.9(L) 27.6(L)  Platelets 150 - 400 K/uL 188 191    CMP Latest Ref Rng & Units 09/01/2020 08/31/2020  Glucose 70 - 99 mg/dL 117(H) 116(H)  BUN 6 - 20 mg/dL 11 11  Creatinine 0.44 - 1.00 mg/dL 0.64 0.60  Sodium 135 - 145 mmol/L 141 139  Potassium 3.5 - 5.1 mmol/L 4.2 3.9  Chloride 98 - 111 mmol/L 109 106  CO2 22 - 32 mmol/L 23 24  Calcium 8.9 - 10.3 mg/dL 10.2 10.5(H)  Total Protein 6.5 - 8.1 g/dL 7.1 7.9  Total Bilirubin 0.3 - 1.2 mg/dL 1.1 1.1  Alkaline Phos 38 - 126 U/L 79 95  AST 15 - 41 U/L 54(H) 51(H)  ALT 0 - 44 U/L 25 25    Imaging: No recent imaging today   Assessment/Plan:   Active Problems:   Acute encephalopathy   Sepsis ([place])   Patient Summary: Candace Robinson is a 55 y.o. with a pertinent PMH of CHF, A. fib, hypertension, type 2 diabetes, venous insufficiency with lower extremity ulcers, who presented with altered mental status and lower extremity wounds and admitted for severe thyrotoxicosis.   #Thyrotoxicosis, severe #Acute Encephalopathy, multifactorial #Supraventricular tachycardia, sinus tachycardia Patient continues to have signs and symptoms concerning for severe thyrotoxicosis.  She was started on propranolol, PTU, and iodine today.  Her tachycardia has improved and is starting diltiazem but she remains in the low 100s.  She remains altered from baseline with fluctuations  in cognition. -Continue PTU, propranolol, iodine -Titrate off of diltiazem initiate propranolol for rate control.  Goal rate of less than 110. -Speech evaluation pending - Delirium precautions  Hypercalcemia: - We will continue fluid supplementation - Status post 1 dose of calcitonin - Consider bisphosphonate therapy if calcium continues to trend upward -Repeat electrolytes this afternoon.  Hx of CHF: Patient has a history of congestive heart failure with no  previous echoes in her chart.  Could be secondary to previous history of hypothyroidism/thyrotoxicosis.  Echo pending.  BNP was significantly elevated greater than thousand on admission. -We will hold off on Lasix therapy at this time due to IV fluid supplementation in setting of hypercalcemia -Echo pending -We will start guideline directed heart failure medications once echo has been performed and patient is more stable.  Lower extremity ulcers, cellulitis vs osteomyelitis: Presents with chronic lower extremity ulcers with concern for purulent cellulitis.  On evaluation the patient's lower extremity wounds appear to have a fibrinous exudate.  These wounds appear to be chronic.  X-ray shows signs of osteomyelitis and her left foot.  ESR and sed rate elevated.  MRI is pendin for further evaluation of osteomyelitis once patient is more stable. -Continue vancomycin for soft tissue infection. -MRI of lower extremities once more stable. -Osteomyelitis is present patient will need vascular studies to assess blood flow to the lower extremities. -Blood cultures and urine cultures pending  Normocytic Anemia: Appears secondary to iron deficiency anemia.  Will supplement with iron once patient is more stable and infection has been ruled out. - monitor closely.    Diet: NPO pending SLP evaluation IVF: None,None VTE: Enoxaparin Code: Full PT/OT recs: Pending  TOC recs: None Family Update: yesterday   Dispo: Anticipated discharge in 4 days pending further work-up.   Lawerance Cruel, D.O.  Internal Medicine Resident, PGY-2 [facility]  Pager: [phone] 4:05 PM, 09/01/2020    Please contact the on call pager after 5 pm and on weekends at [phone].

## 2020-09-01 NOTE — Evaluation (Signed)
Clinical/Bedside Swallow Evaluation Patient Details  Name: Candace Robinson MRN: [id number] Date of Birth: [birth year]  Today's Date: 09/01/2020 Time: SLP Start Time (ACUTE ONLY): 26 SLP Stop Time (ACUTE ONLY): 1105 SLP Time Calculation (min) (ACUTE ONLY): 15 min  Past Medical History:  Past Medical History:  Diagnosis Date  . Anemia   . Atrial fibrillation ([place])   . CHF (congestive heart failure) ([place])   . Diabetes mellitus without complication ([place])   . Heart murmur   . Hypertension   . Lupus ([facility])   . Thyroid disease    hyperthyroid   Past Surgical History: History reviewed. No pertinent surgical history. HPI:  55 y/o female with advanced discoid lupus admitted on 2/28 in the setting of hallucinations, concern for hyperthyroidism. Per MD, broad differential diagnosis including hyperthyroid related symptoms, primary psychiatric issue, other intoxication or drug use, organic brain issue (lupus cerebritis)> improved this morning of exam per notes.   Assessment / Plan / Recommendation Clinical Impression  Patient presents with a normal oropharyngeal swallow, No further SLP needs indicated at this time. 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 Per MD, broad differential diagnosis including hyperthyroid related symptoms, primary psychiatric issue, other intoxication or drug use, organic brain issue (lupus cerebritis)> improved this morning of exam per  notes. Type of Study: Bedside Swallow Evaluation Previous Swallow Assessment: none Diet Prior to this Study: NPO Temperature Spikes Noted: No Respiratory Status: Nasal cannula History of Recent Intubation: No Behavior/Cognition: Alert;Cooperative;Pleasant mood;Confused Oral Cavity Assessment: Within Functional Limits Oral Care Completed by SLP: No Oral Cavity - Dentition: Adequate natural dentition Vision: Functional for self-feeding Self-Feeding Abilities: Able to feed self Patient Positioning: Upright in bed Baseline Vocal Quality: Normal Volitional Cough: Strong Volitional Swallow: Able to elicit    Oral/Motor/Sensory Function Overall Oral Motor/Sensory Function: Within functional limits   Ice Chips     Thin Liquid Thin Liquid: Within functional limits Presentation: Cup;Self Fed;Straw    Nectar Thick Nectar Thick Liquid: Not tested   Honey Thick Honey Thick Liquid: Not tested   Puree Puree: Within functional limits Presentation: Spoon   Solid     Solid: Within functional limits Presentation: Self Fed     Parker Hannifin MA, [place] 09/01/2020,11:07 AM

## 2020-09-02 ENCOUNTER — Inpatient Hospital Stay (HOSPITAL_COMMUNITY): Payer: Medicaid Other

## 2020-09-02 DIAGNOSIS — I5030 Unspecified diastolic (congestive) heart failure: Secondary | ICD-10-CM

## 2020-09-02 DIAGNOSIS — M329 Systemic lupus erythematosus, unspecified: Secondary | ICD-10-CM

## 2020-09-02 LAB — COMPREHENSIVE METABOLIC PANEL
ALT: 25 U/L (ref 0–44)
AST: 44 U/L — ABNORMAL HIGH (ref 15–41)
Albumin: 2.3 g/dL — ABNORMAL LOW (ref 3.5–5.0)
Alkaline Phosphatase: 75 U/L (ref 38–126)
Anion gap: 7 (ref 5–15)
BUN: 14 mg/dL (ref 6–20)
CO2: 24 mmol/L (ref 22–32)
Calcium: 9.7 mg/dL (ref 8.9–10.3)
Chloride: 108 mmol/L (ref 98–111)
Creatinine, Ser: 0.52 mg/dL (ref 0.44–1.00)
GFR, Estimated: >60 mL/min (ref >60)
Glucose, Bld: 154 mg/dL — ABNORMAL HIGH (ref 70–99)
Potassium: 3.7 mmol/L (ref 3.5–5.1)
Sodium: 139 mmol/L (ref 135–145)
Total Bilirubin: 0.7 mg/dL (ref 0.3–1.2)
Total Protein: 7.1 g/dL (ref 6.5–8.1)

## 2020-09-02 LAB — CBC
HCT: 24.2 % — ABNORMAL LOW (ref 36.0–46.0)
Hemoglobin: 7.4 g/dL — ABNORMAL LOW (ref 12.0–15.0)
MCH: 24.7 pg — ABNORMAL LOW (ref 26.0–34.0)
MCHC: 30.6 g/dL (ref 30.0–36.0)
MCV: 80.9 fL (ref 80.0–100.0)
Platelets: 162 10*3/uL (ref 150–400)
RBC: 2.99 MIL/uL — ABNORMAL LOW (ref 3.87–5.11)
RDW: 16.4 % — ABNORMAL HIGH (ref 11.5–15.5)
WBC: 6.2 10*3/uL (ref 4.0–10.5)
nRBC: 0 % (ref 0.0–0.2)

## 2020-09-02 LAB — URINE CULTURE: Culture: NO GROWTH

## 2020-09-02 LAB — VITAMIN D 25 HYDROXY (VIT D DEFICIENCY, FRACTURES): Vit D, 25-Hydroxy: 17.02 ng/mL — ABNORMAL LOW (ref 30–100)

## 2020-09-02 LAB — MAGNESIUM: Magnesium: 1.7 mg/dL (ref 1.7–2.4)

## 2020-09-02 MED ORDER — HYDROMORPHONE HCL 1 MG/ML IJ SOLN
0.5000 mg | Freq: Once | INTRAMUSCULAR | Status: AC
  Administered 2020-09-02: 0.5 mg via INTRAVENOUS
  Filled 2020-09-02: qty 1

## 2020-09-02 MED ORDER — ZINC OXIDE 12.8 % EX OINT
TOPICAL_OINTMENT | Freq: Two times a day (BID) | CUTANEOUS | Status: DC
  Filled 2020-09-02 (×3): qty 56.7

## 2020-09-02 MED ORDER — FOLIC ACID 1 MG PO TABS
1.0000 mg | ORAL_TABLET | Freq: Every day | ORAL | Status: DC
  Administered 2020-09-03 – 2020-09-17 (×15): 1 mg via ORAL
  Filled 2020-09-02 (×15): qty 1

## 2020-09-02 MED ORDER — GADOBUTROL 1 MMOL/ML IV SOLN
8.0000 mL | Freq: Once | INTRAVENOUS | Status: AC | PRN
  Administered 2020-09-02: 8 mL via INTRAVENOUS

## 2020-09-02 MED ORDER — ACETAMINOPHEN 325 MG PO TABS
650.0000 mg | ORAL_TABLET | Freq: Four times a day (QID) | ORAL | Status: DC
  Administered 2020-09-02 – 2020-09-18 (×55): 650 mg via ORAL
  Filled 2020-09-02 (×57): qty 2

## 2020-09-02 MED ORDER — FENTANYL CITRATE (PF) 100 MCG/2ML IJ SOLN: 25.0000 ug | Freq: Four times a day (QID) | INTRAMUSCULAR | Status: DC | PRN

## 2020-09-02 MED ORDER — SILVER SULFADIAZINE 1 % EX CREA
TOPICAL_CREAM | Freq: Two times a day (BID) | CUTANEOUS | Status: DC
  Administered 2020-09-11 – 2020-09-13 (×2): 1 via TOPICAL
  Filled 2020-09-02 (×5): qty 85

## 2020-09-02 MED ORDER — LORAZEPAM 2 MG/ML IJ SOLN
0.5000 mg | Freq: Once | INTRAMUSCULAR | Status: AC
  Administered 2020-09-02: 0.5 mg via INTRAVENOUS
  Filled 2020-09-02: qty 1

## 2020-09-02 MED ORDER — FENTANYL CITRATE (PF) 100 MCG/2ML IJ SOLN
25.0000 ug | Freq: Once | INTRAMUSCULAR | Status: AC
  Administered 2020-09-02: 25 ug via INTRAVENOUS
  Filled 2020-09-02: qty 2

## 2020-09-02 MED ORDER — OXYCODONE HCL 5 MG PO TABS
5.0000 mg | ORAL_TABLET | ORAL | Status: DC | PRN
  Administered 2020-09-02 – 2020-09-17 (×32): 5 mg via ORAL
  Filled 2020-09-02 (×33): qty 1

## 2020-09-02 NOTE — Progress Notes (Signed)
0901 Patient given scheduled pain medicine during wound nurse assessment. IV Medicine has not shown signs of effectiveness after 30 minutes, yet. Patient is crying out in pain,constant moaning, asking to see if there is more pain medicine to give her.   Feet are both open to air.  Anything more than a bed sheet over the patients feet is too heavy for the patient to withstand.   Shelbie Proctor, RN

## 2020-09-02 NOTE — Consult Note (Signed)
[facility] Nurse Consult Note: Patient receiving care in [place] Reason for Consult: BLE wounds and buttock wounds Wound type: cutaneous lesions associated with Lupus on the feet/legs. There aren't wounds present on the buttocks. There are multiple scattered lesions to to posterior upper thighs, also likely related to cutaneous lupus. Pressure Injury POA: Yes/No/NA Measurement: Too painful for patient to tolerate measuring. Wound bed: All foot/leg wounds are with heavy yellow slough. There are full thickness wounds to the dorsum of the left foot left lower leg gaiter area.  The right foot toes 1-3 have full thickness wounds as well as the right medial malleolus area.  All are yellow with some pink showing through.  They all have a foul odor.  The patient could not tolerate cleansing of the wounds.  The bilateral posterior upper thighs have scattered full thickness areas consistent with cutaneous presentation of lupus.  All are pink except for one on the left upper posterior thigh and it has a yellow coating. Drainage (amount, consistency, odor) tan. Periwound: intact Dressing procedure/placement/frequency:  MEDICATE PATIENT PATIENT PRIOR TO DRESSING CHANGES. MOISTEN OLD DRESSING PRIOR TO REMOVING.Apply to all wounds on Bilateral feet/lower legs. Cover with ABD pads, secure with kerlex.  Twice daily Triple Paste has been ordered for the thigh wounds.  The patient had an incontinent brief on, it was removed. Thank you for the consult.  Discussed plan of care with the patient and bedside nurse.  [facility] nurse will not follow at this time.  Please re-consult the [facility] team if needed.  Val Riles, RN, MSN, CWOCN, CNS-BC, pager [phone]

## 2020-09-02 NOTE — Progress Notes (Addendum)
HD#2 Subjective:  Overnight Events: No acute events overnight  [name] states she is doing okay this morning, overall feeling better. She endorses worsening of pain from her bilateral foot wounds. She denies feeling as though her heart is racing. Does endorse one episode of diarrhea this am.  She notes feeling less "foggy headed" and feels as though she has a better idea as to why she is in the hospital. She remembers being on keflex for her lower extremity wounds and not feeling well after that. She then notes her sister thought she wasn't thinking clearly and sent her to the hospital. She is aware that she is in the hospital for her thyroid. She denies a history of thyroid disease, but states if she was prescribed thyroid medicine in the past, it may have been when she was discharged from [facility] in November. She became tearful when talking about waking up in a hospital and not knowing where she was.   Objective:  Vital signs in last 24 hours: Vitals:   09/02/20 0300 09/02/20 0303 09/02/20 0621 09/02/20 0700  BP: 111/72  118/72 118/83  Pulse: 77  78 77  Resp: (!) 30 (!) 24  20  Temp: 99 F (37.2 C)   98.5 F (36.9 C)  TempSrc: Oral   Oral  SpO2: 98%   100%  Weight:      Height:       Supplemental O2: Room air, saturating well  Physical Exam:  Constitutional: Ill-appearing female, tearful  HENT: normocephalic atraumatic, Eyes: conjunctiva non-erythematous Neck: supple Cardiovascular: 2/6 Systolic murmur present on exam, best appreciated over right 2nd intercostal space. Regular rate and rhythm.  Pulmonary/Chest: Tachypnea otherwise no respiratory distress nor does she have inappropriate lung sounds MSK: normal bulk and tone Neurological: Alert and oriented x 3 Skin: Patient has warm extremities mildly diaphoretic with significant lower extremity wounds.  She also has significant lower extremity edema up to her waist.  2+ pitting edema bilaterally in her lower  extremities. Psych: Tearful when talking about her sister and he wounds.   Filed Weights   08/31/20 1042  Weight: 84.8 kg     Intake/Output Summary (Last 24 hours) at 09/02/2020 1130 Last data filed at 09/01/2020 1845 Gross per 24 hour  Intake 659.95 ml  Output --  Net 659.95 ml   Net IO Since Admission: 1,811.09 mL [09/02/20 1130]  Pertinent Labs: CBC Latest Ref Rng & Units 09/02/2020 09/01/2020 08/31/2020  WBC 4.0 - 10.5 K/uL 6.2 10.2 5.9  Hemoglobin 12.0 - 15.0 g/dL 7.4(L) 7.3(L) 8.0(L)  Hematocrit 36.0 - 46.0 % 24.2(L) 23.9(L) 27.6(L)  Platelets 150 - 400 K/uL 162 188 191    CMP Latest Ref Rng & Units 09/02/2020 09/01/2020 09/01/2020  Glucose 70 - 99 mg/dL 154(H) 138(H) 117(H)  BUN 6 - 20 mg/dL 14 12 11   Creatinine 0.44 - 1.00 mg/dL 0.52 0.61 0.64  Sodium 135 - 145 mmol/L 139 138 141  Potassium 3.5 - 5.1 mmol/L 3.7 3.6 4.2  Chloride 98 - 111 mmol/L 108 107 109  CO2 22 - 32 mmol/L 24 23 23   Calcium 8.9 - 10.3 mg/dL 9.7 10.0 10.2  Total Protein 6.5 - 8.1 g/dL 7.1 7.1 7.1  Total Bilirubin 0.3 - 1.2 mg/dL 0.7 1.2 1.1  Alkaline Phos 38 - 126 U/L 75 75 79  AST 15 - 41 U/L 44(H) 58(H) 54(H)  ALT 0 - 44 U/L 25 28 25     Imaging: No recent imaging today  Assessment/Plan:   Active Problems:   Acute encephalopathy   Sepsis ([place])   Atrial fibrillation with RVR (H[facility])   Ulcers of both lower extremities ([facility])   Thyrotoxicosis with crisis   Patient Summary: [name] is a 55 y.o. with a pertinent PMH of CHF, A. fib, hypertension, type 2 diabetes, venous insufficiency with lower extremity ulcers, who presented with altered mental status and lower extremity wounds and admitted for severe thyrotoxicosis.   #Thyrotoxicosis, severe #Acute Encephalopathy, multifactorial #Supraventricular tachycardia, sinus tachycardia Patient has improved since starting treatment for her severe thyrotoxicosis, she continues to be hemodynamically stable. She is more alert and oriented today, able to  recall the past few days and weeks. She continues to be warm to touch. Her tachycardia has improved with the propranolol. She does endorse an episode of diarrhea that I believe is secondary to her thyrotoxicosis. We will switch from PTU to methimazole once more stable. Plan to continue propranolol and iodine until patient is more stable, at that time we will discontinue both. Uncertain etiology of thyrotoxicosis at this time, working to obtain prior medical records. Thyroid US without nodules. Suspect patient's thyrotoxicosis worsened by lower extremity infection and possibly medication non-adherence to methimazole. -continue PTU, propranolol, iodine -HR goal rate of less than 110. -thyroid US showed stable thyromegaly without nodules -delirium precautions  Hypercalcemia: Corrected calcium of 11.4 mg/dl  Stable when compared to yesterdays. Will stop IV fluids for now in context of patient's HF and increase in volume status -status post 1 dose of calcitonin. Discontinued 125 cc/hr LR. Will plan to restart fluids if calcium levels increase -PTH and vitamin D levels ordered -consider bisphosphonate therapy if calcium continues to trend upward -daily BMP  HFpEF, Echo 50-55% Patient has a history of congestive heart failure with no previous echoes in her chart. Echo during current admission, EF 50-55%, no regional wall abnormalities. Mild/moderate aortic valve stenosis (mean gradient of 21.5 mmHg). Uncertain if echo reflects patient's baseline or if exacerbated by thyrotoxicosis. Will need repeat echo on outpatient basis after stabilization of thyroid disease.  -appears hypervolemic on exam, will d/c fluids for now and restart if calcium levels trend upward. -will restart home diltiazem once she is more stable from thyrotoxicosis and the propranolol is discontinued  Lower extremity ulcers, cellulitis vs osteomyelitis: Presents with chronic lower extremity ulcers with concern for purulent cellulitis.   MRI to be performed today, .5 mg ativan ordered to help with agitation and pain.  -continue vancomycin for soft tissue infection Day 2 -pain med regimen - scheduled tylenol 650 mg q6h with oxycodone 5 mg q4h for breakthrough pain. -wound care consult in place -MRI lower extremities today -suggested osteomyelitis on x-ray, patient will need vascular studies to assess blood flow to the lower extremities. -blood cultures and urine cultures no growth thus far.  Normocytic Anemia: Appears secondary to iron deficiency anemia.  Will supplement with iron once patient is more stable and infection has been ruled out. - monitor closely.   SLE Patient with Hx of SLE, patient with skin lesions on scalp as well as upper thighs consistent with cutaneous manifestation. Will to continue to monitor.  Diet: Regular diet, thin IVF: None,None VTE: Enoxaparin Code: Full PT/OT recs: Pending  TOC recs: None Family Update: yesterday  Dispo: Anticipated discharge in 4 days pending further work-up.   Sanjuana Letters DO  Internal Medicine Resident PGY-1 [place]  Pager: [phone] Please contact the on call pager after 5 pm and on weekends at [phone].

## 2020-09-02 NOTE — Progress Notes (Signed)
OT Cancellation Note  Patient Details Name: Candace Robinson MRN: [id number] DOB: 11-26-1965   Cancelled Treatment:    Reason Eval/Treat Not Completed: Medical issues which prohibited therapy. Pt is in extreme pain.  RN states pt getting ready to work on wounds and pt has MRI scheduled.  RN asks to hold presently. Of note, pt does also still have an active bed rest order. Will re attempt at a later time/date.  Tyrone Schimke, OT Acute Rehabilitation Services Pager: [phone] Office: [phone]  09/02/2020, 1:22 PM

## 2020-09-02 NOTE — Progress Notes (Signed)
PT Cancellation Note  Patient Details Name: Candace Robinson MRN: [id number] DOB: 1965-07-08   Cancelled Treatment:    Reason Eval/Treat Not Completed: Patient not medically ready.  Pt is in extreme pain.  RN states pt getting ready to work on wounds and pt has MRI scheduled.  RN asks to hold presently.  Will try back later as appropriate. 09/02/2020  Ginger Carne., PT Acute Rehabilitation Services [phone]  (pager) [phone]  (office)   Tessie Fass Toma Erichsen 09/02/2020, 1:18 PM

## 2020-09-03 ENCOUNTER — Inpatient Hospital Stay (HOSPITAL_COMMUNITY): Payer: Medicaid Other

## 2020-09-03 LAB — COMPREHENSIVE METABOLIC PANEL
ALT: 21 U/L (ref 0–44)
AST: 37 U/L (ref 15–41)
Albumin: 2.1 g/dL — ABNORMAL LOW (ref 3.5–5.0)
Alkaline Phosphatase: 75 U/L (ref 38–126)
Anion gap: 7 (ref 5–15)
BUN: 13 mg/dL (ref 6–20)
CO2: 23 mmol/L (ref 22–32)
Calcium: 9.6 mg/dL (ref 8.9–10.3)
Chloride: 107 mmol/L (ref 98–111)
Creatinine, Ser: 0.49 mg/dL (ref 0.44–1.00)
GFR, Estimated: >60 mL/min (ref >60)
Glucose, Bld: 119 mg/dL — ABNORMAL HIGH (ref 70–99)
Potassium: 3.3 mmol/L — ABNORMAL LOW (ref 3.5–5.1)
Sodium: 137 mmol/L (ref 135–145)
Total Bilirubin: 1 mg/dL (ref 0.3–1.2)
Total Protein: 6.8 g/dL (ref 6.5–8.1)

## 2020-09-03 LAB — PARATHYROID HORMONE, INTACT (NO CA): PTH: 20 pg/mL (ref 15–65)

## 2020-09-03 LAB — CBC WITH DIFFERENTIAL/PLATELET
Abs Immature Granulocytes: 0.03 10*3/uL (ref 0.00–0.07)
Basophils Absolute: 0 10*3/uL (ref 0.0–0.1)
Basophils Relative: 0 %
Eosinophils Absolute: 0.1 10*3/uL (ref 0.0–0.5)
Eosinophils Relative: 2 %
HCT: 24 % — ABNORMAL LOW (ref 36.0–46.0)
Hemoglobin: 7.1 g/dL — ABNORMAL LOW (ref 12.0–15.0)
Immature Granulocytes: 1 %
Lymphocytes Relative: 32 %
Lymphs Abs: 2 10*3/uL (ref 0.7–4.0)
MCH: 24 pg — ABNORMAL LOW (ref 26.0–34.0)
MCHC: 29.6 g/dL — ABNORMAL LOW (ref 30.0–36.0)
MCV: 81.1 fL (ref 80.0–100.0)
Monocytes Absolute: 0.8 10*3/uL (ref 0.1–1.0)
Monocytes Relative: 13 %
Neutro Abs: 3.3 10*3/uL (ref 1.7–7.7)
Neutrophils Relative %: 52 %
Platelets: 134 10*3/uL — ABNORMAL LOW (ref 150–400)
RBC: 2.96 MIL/uL — ABNORMAL LOW (ref 3.87–5.11)
RDW: 16.4 % — ABNORMAL HIGH (ref 11.5–15.5)
WBC: 6.2 10*3/uL (ref 4.0–10.5)
nRBC: 0 % (ref 0.0–0.2)

## 2020-09-03 MED ORDER — VITAMIN D (ERGOCALCIFEROL) 1.25 MG (50000 UNIT) PO CAPS
50000.0000 [IU] | ORAL_CAPSULE | ORAL | Status: DC
  Administered 2020-09-03 – 2020-09-10 (×2): 50000 [IU] via ORAL
  Filled 2020-09-03 (×3): qty 1

## 2020-09-03 MED ORDER — METHIMAZOLE 10 MG PO TABS
20.0000 mg | ORAL_TABLET | Freq: Two times a day (BID) | ORAL | Status: DC
  Administered 2020-09-03 – 2020-09-08 (×11): 20 mg via ORAL
  Filled 2020-09-03 (×12): qty 2

## 2020-09-03 MED ORDER — ONDANSETRON HCL 4 MG/2ML IJ SOLN
4.0000 mg | Freq: Three times a day (TID) | INTRAMUSCULAR | Status: DC | PRN
  Administered 2020-09-03 – 2020-09-10 (×3): 4 mg via INTRAVENOUS
  Filled 2020-09-03 (×3): qty 2

## 2020-09-03 MED ORDER — POTASSIUM CHLORIDE CRYS ER 20 MEQ PO TBCR
40.0000 meq | EXTENDED_RELEASE_TABLET | Freq: Two times a day (BID) | ORAL | Status: DC
  Administered 2020-09-03 – 2020-09-08 (×11): 40 meq via ORAL
  Filled 2020-09-03 (×11): qty 2

## 2020-09-03 MED ORDER — SODIUM CHLORIDE 0.9 % IV SOLN
60.0000 mg | Freq: Once | INTRAVENOUS | Status: AC
  Administered 2020-09-03: 60 mg via INTRAVENOUS
  Filled 2020-09-03: qty 20

## 2020-09-03 NOTE — Progress Notes (Signed)
Pharmacy Antibiotic Note  Kentrell Guettler is a 55 y.o. female admitted on 08/31/2020 with purulent wound infection.  Renal fx remains stable  Mri neg for osteo  Plan: Vancomycin 1250mg  IV q24h Monitor renal fx cx lvls prn Wonder if patient could be treated with an oral agent  Height: 5\' 3"  (160 cm) Weight: 84.8 kg (187 lb) IBW/kg (Calculated) : 52.4  Temp (24hrs), Avg:98.6 F (37 C), Min:97.4 F (36.3 C), Max:99.3 F (37.4 C)  Recent Labs  Lab 08/31/20 1102 09/01/20 0342 09/01/20 1735 09/02/20 0716 09/03/20 0409  WBC 5.9 10.2  --  6.2 6.2  CREATININE 0.60 0.64 0.61 0.52 0.49  LATICACIDVEN 1.8 1.2  --   --   --     Estimated Creatinine Clearance: 81.1 mL/min (by C-G formula based on SCr of 0.49 mg/dL).    Allergies  Allergen Reactions  . Motrin [Ibuprofen]     Unknown   Barth Kirks, PharmD, [place] Clinical Pharmacist [phone]  Please check AMION for all [place] numbers  09/03/2020 9:49 AM

## 2020-09-03 NOTE — Progress Notes (Signed)
Pt arrived to unit from 4NP accompanied by staff >pt alert/oriented in no apparent distress. Pt situated/orientated to room/equipments. Welcome menu guide/menu provided with instructions. Pt verbalized understanding of instructions. No complaints. Bed in lowest position with 3 side rails up, call bell/room phone within reach and all wheels locked.

## 2020-09-03 NOTE — Evaluation (Addendum)
Occupational Therapy Evaluation Patient Details Name: Candace Robinson MRN: [id number] DOB: [birth year] Today's Date: 09/03/2020    History of Present Illness The pt is a 55 yo female who presenting 2/28 due to concerns of BLE ulcers and AMS. 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 PMH includes: CHF, afib, HTN, DM II, lupus, heart mumur, and chronic venous insufficiency.   Clinical Impression   PTA, pt was living with her sister and was performing BADLs; sister assist with IADLs. Currently, pt requiring requiring Max-Total A for ADLs and bed mobility. Pt very limited by pain at BLEs and increased anxiety in anticipation of pain. Pt with BM and requiring Total A for peri care at bed level. Pt would benefit from further acute OT to facilitate safe dc. Recommend dc to SNF for further OT to optimize safety, independence with ADLs, and return to PLOF.     Follow Up Recommendations  SNF    Equipment Recommendations  None recommended by OT    Recommendations for Other Services PT consult     Precautions / Restrictions Precautions Precautions: Fall;Other (comment) (Significant pain at BLEs)      Mobility Bed Mobility Overal bed mobility: Needs Assistance Bed Mobility: Rolling Rolling: Max assist;+2 for physical assistance         General bed mobility comments: Max A +2 for rolling in bed to clean after BM    Transfers                 General transfer comment: Defer due to pain    Balance                                           ADL either performed or assessed with clinical judgement   ADL Overall ADL's : Needs assistance/impaired Eating/Feeding: Set up;Bed level   Grooming: Set up;Wash/dry face;Bed level   Upper Body Bathing: Maximal assistance;Bed level   Lower Body Bathing: Total assistance;Bed level   Upper Body Dressing : Maximal assistance;Bed level   Lower Body Dressing: Total assistance;Bed level       Toileting-  Clothing Manipulation and Hygiene: +2 for physical assistance;Total assistance;Bed level Toileting - Clothing Manipulation Details (indicate cue type and reason): Total A +2 for peri care at bed level.       General ADL Comments: Focused on peri care as pt with bowel incontinience. Total A for peri care. Pt very limited by pain at BLEs. Pt crying and very anxious.     Vision         Perception     Praxis      Pertinent Vitals/Pain Pain Assessment: 0-10 Pain Score: 10-Worst pain ever Pain Intervention(s): Monitored during session;Limited activity within patient's tolerance;Repositioned     Hand Dominance Right   Extremity/Trunk Assessment Upper Extremity Assessment Upper Extremity Assessment: Generalized weakness   Lower Extremity Assessment Lower Extremity Assessment: Defer to PT evaluation       Communication Communication Communication: Other (comment) (soft spoken and mumbling)   Cognition Arousal/Alertness: Awake/alert;Lethargic (drowsy) Behavior During Therapy: WFL for tasks assessed/performed;Anxious Overall Cognitive Status: Impaired/Different from baseline Area of Impairment: Attention;Memory;Safety/judgement;Awareness;Problem solving;Following commands                   Current Attention Level: Focused;Sustained Memory: Decreased short-term memory;Decreased recall of precautions Following Commands: Follows one step commands inconsistently;Follows one step commands with increased time Safety/Judgement: Decreased  awareness of safety;Decreased awareness of deficits Awareness: Intellectual Problem Solving: Slow processing;Requires verbal cues;Difficulty sequencing General Comments: Highly distracted by anxiety. Requiring Max cues for following simple commands and decrease anxiety.   General Comments  BM in bed    Exercises     Shoulder Instructions      Home Living Family/patient expects to be discharged to:: Private residence Living  Arrangements: Other relatives (sister) Available Help at Discharge: Family Type of Home: House Home Access: Stairs to enter Technical brewer of Steps: 1   Home Layout: Able to live on main level with bedroom/bathroom     Bathroom Shower/Tub: Teacher, early years/pre: Standard     Home Equipment: Environmental consultant - 2 wheels;Grab bars - tub/shower;Hand held shower head;Bedside commode          Prior Functioning/Environment Level of Independence: Needs assistance  Gait / Transfers Assistance Needed: using RW ADL's / Homemaking Assistance Needed: Performing BADLs but states sister could assist if needed. sister completes IADLs            OT Problem List: Decreased strength;Decreased range of motion;Decreased activity tolerance;Impaired balance (sitting and/or standing);Decreased cognition;Decreased knowledge of precautions;Decreased safety awareness;Decreased knowledge of use of DME or AE;Pain      OT Treatment/Interventions: Self-care/ADL training;Therapeutic exercise;Energy conservation;DME and/or AE instruction;Therapeutic activities;Patient/family education    OT Goals(Current goals can be found in the care plan section) Acute Rehab OT Goals Patient Stated Goal: Reduce pain OT Goal Formulation: With patient Time For Goal Achievement: 09/17/20 Potential to Achieve Goals: Good  OT Frequency: Min 2X/week   Barriers to D/C:            Co-evaluation PT/OT/SLP Co-Evaluation/Treatment: Yes Reason for Co-Treatment: For patient/therapist safety;Other (comment);To address functional/ADL transfers (Pain management)   OT goals addressed during session: ADL's and self-care      AM-PAC OT "6 Clicks" Daily Activity     Outcome Measure Help from another person eating meals?: A Little Help from another person taking care of personal grooming?: A Little Help from another person toileting, which includes using toliet, bedpan, or urinal?: Total Help from another person  bathing (including washing, rinsing, drying)?: A Lot Help from another person to put on and taking off regular upper body clothing?: A Lot Help from another person to put on and taking off regular lower body clothing?: Total 6 Click Score: 12   End of Session Nurse Communication: Mobility status  Activity Tolerance: Patient limited by fatigue;Patient limited by pain Patient left: in bed;with call bell/phone within reach;with bed alarm set  OT Visit Diagnosis: Unsteadiness on feet (R26.81);Other abnormalities of gait and mobility (R26.89);Muscle weakness (generalized) (M62.81);Pain                Time: 2641-5830 OT Time Calculation (min): 21 min Charges:  OT General Charges $OT Visit: 1 Visit OT Evaluation $OT Eval Moderate Complexity: [place], OTR/L Acute Rehab Pager: [phone] Office: Short 09/03/2020, 5:34 PM

## 2020-09-03 NOTE — Progress Notes (Signed)
HD#3 Subjective:  Overnight Events:  Candace Robinson is concerned regarding her hair loss. She denies any diarrhea since Wednesday although nursing state she continues to have diarrhea. She denies feeling foggy-headed although states she struggles to "keep it all together" with all the medications she is taking. Denies heart racing or having chest pain. Denies shortness of breath.   Patient's sister at bedside, updated her on current status of patient and plan moving forward. Patient's sister notes that patient's mentation is much improved since prior to her admission. She also informed the team that the patient was supposed to be started on a thyroid medication, but was unable to have it delivered before her confusion worsened.   Objective:  Vital signs in last 24 hours: Vitals:   09/02/20 1513 09/02/20 1919 09/02/20 2300 09/03/20 0300  BP: 131/88 (!) 105/91 (!) 105/57 118/67  Pulse: 81 82 77 74  Resp: (!) 29 20 (!) 26 18  Temp: 97.7 F (36.5 C) 98.4 F (36.9 C) 99.2 F (37.3 C) 99.3 F (37.4 C)  TempSrc: Oral Oral Oral Oral  SpO2: 98% 90% 97% 100%  Weight:      Height:       Supplemental O2: Room air, saturating well  Physical Exam:  Constitutional: Ill-appearing female, tearful  HENT: normocephalic atraumatic, Eyes: conjunctiva non-erythematous Neck: supple Cardiovascular: 2/6 Systolic murmur present on exam, best heard on left 2nd intercostal space. Regular rate and rhythm.  Pulmonary/Chest: Clear to auscultation bilaterally. No wheezing, rales, or rhonchi.  MSK: normal bulk and tone Neurological: Alert and oriented x 3 Skin: Patient has warm extremities. 1+-2+ pitting edema bilaterally in her lower extremities. Lower extremity wounds wrapped. Psych: Normal mood  Filed Weights   08/31/20 1042  Weight: 84.8 kg     Intake/Output Summary (Last 24 hours) at 09/03/2020 0709 Last data filed at 09/02/2020 1800 Gross per 24 hour  Intake 250 ml  Output 505 ml  Net -255 ml    Net IO Since Admission: 1,556.09 mL [09/03/20 0709]  Pertinent Labs: CBC Latest Ref Rng & Units 09/03/2020 09/02/2020 09/01/2020  WBC 4.0 - 10.5 K/uL 6.2 6.2 10.2  Hemoglobin 12.0 - 15.0 g/dL 7.1(L) 7.4(L) 7.3(L)  Hematocrit 36.0 - 46.0 % 24.0(L) 24.2(L) 23.9(L)  Platelets 150 - 400 K/uL 134(L) 162 188    CMP Latest Ref Rng & Units 09/03/2020 09/02/2020 09/01/2020  Glucose 70 - 99 mg/dL 119(H) 154(H) 138(H)  BUN 6 - 20 mg/dL 13 14 12   Creatinine 0.44 - 1.00 mg/dL 0.49 0.52 0.61  Sodium 135 - 145 mmol/L 137 139 138  Potassium 3.5 - 5.1 mmol/L 3.3(L) 3.7 3.6  Chloride 98 - 111 mmol/L 107 108 107  CO2 22 - 32 mmol/L 23 24 23   Calcium 8.9 - 10.3 mg/dL 9.6 9.7 10.0  Total Protein 6.5 - 8.1 g/dL 6.8 7.1 7.1  Total Bilirubin 0.3 - 1.2 mg/dL 1.0 0.7 1.2  Alkaline Phos 38 - 126 U/L 75 75 75  AST 15 - 41 U/L 37 44(H) 58(H)  ALT 0 - 44 U/L 21 25 28     Imaging: No recent imaging today   Assessment/Plan:   Active Problems:   Acute encephalopathy   Sepsis (HCC)   Atrial fibrillation with RVR (HCC)   Ulcers of both lower extremities (HCC)   Thyrotoxicosis with crisis   Patient Summary: Candace Robinson is a 55 y.o. with a pertinent PMH of CHF, A. fib, hypertension, type 2 diabetes, venous insufficiency with lower extremity ulcers, who presented  with altered mental status and lower extremity wounds and admitted for severe thyrotoxicosis.   #Thyrotoxicosis, severe #Acute Encephalopathy, multifactorial #Supraventricular tachycardia, sinus tachycardia Patient continues to be hemodynamically stable. Her tachycardia has improved with the propranolol. Continues to have episodes of diarrhea. Will switch from PTU to methimazole and stop iodine treatment. Continue propranolol. Will repeat TSH, T4, T3 tomorrow. Will adjust methimazole as needed for patient to achieve euthyroid state -transition from PTU to methimazole 20 mg BID -continue propranolol -stop iodine -TSH Free T4,T3 tomorrow  -HR goal rate  of less than 110. -thyroid US showed stable thyromegaly without nodules -delirium precautions  Hypercalcemia: Corrected calcium of 11.1 mg/dl  Stable when compared to recent labs.  -will give single dose of pamidronate 60 mg -PTH 20 (low-normal) and vitamin D low, appropriate response to hypocalcmiea -daily CMP  HFpEF, Echo 50-55% Patient has a history of congestive heart failure with no previous echoes in her chart. Echo during current admission, EF 50-55%, no regional wall abnormalities. Mild/moderate aortic valve stenosis (mean gradient of 21.5 mmHg). Uncertain if echo reflects patient's baseline or if exacerbated by thyrotoxicosis. Will need repeat echo on outpatient basis after stabilization of thyroid disease.  -will restart home diltiazem once she is more stable from thyrotoxicosis and the propranolol is discontinued  Lower extremity ulcers, cellulitis vs osteomyelitis: Presents with chronic lower extremity ulcers with concern for purulent cellulitis.  MRI of right foot performed without evidence of osteomyelitis. Possible phlegmon/abscess of right foot, will assess tomorrow. Upon our exam patient's feet had just been bandaged. Left foot MRI read pending -continue vancomycin for soft tissue infection Day 4 -pain med regimen - scheduled tylenol 650 mg q6h with oxycodone 5 mg q4h for breakthrough pain. -wound care consult in place -pending left lower extremity MRI -blood cultures and urine cultures no growth thus far.  Normocytic Anemia: Appears secondary to iron deficiency anemia.  Will supplement with iron once patient is more stable and infection has been ruled out. - monitor closely.   SLE Patient with Hx of SLE, patient with skin lesions on scalp as well as upper thighs consistent with cutaneous manifestation. Will to continue to monitor.  Diet: Regular diet, thin IVF: None,None VTE: Enoxaparin Code: Full PT/OT recs: Pending  TOC recs: None Family Update:  yesterday  Dispo: Anticipated discharge in 4 days pending further work-up.   Sanjuana Letters DO  Internal Medicine Resident PGY-1 [place]  Pager: [phone] Please contact the on call pager after 5 pm and on weekends at [phone].

## 2020-09-03 NOTE — Plan of Care (Signed)

## 2020-09-03 NOTE — Progress Notes (Signed)
MD paged to inquire about continuing telemetry, to request something for nausea, and to see if patient is appropriate for Med/Surg.  Orders to be placed and MD to let me know about transfer.

## 2020-09-03 NOTE — Evaluation (Signed)
Physical Therapy Evaluation Patient Details Name: Candace Robinson MRN: [id number] DOB: [birth year] Today's Date: 09/03/2020   History of Present Illness  The pt is a 55 yo female who presenting 2/28 due to concerns of BLE ulcers and AMS. 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 PMH includes: CHF, afib, HTN, DM II, lupus, heart mumur, and chronic venous insufficiency.    Clinical Impression  Pt in bed upon arrival of PT, agreeable to evaluation at this time. Prior to admission the pt was living at home with her sister where she mobilized with use of RW, was independent with ADLs, but relied on her sister for IADLs. The pt now presents with limitations in functional mobility, strength, activity tolerance, and ROM due to above dx and resulting pain, and will continue to benefit from skilled PT to address these deficits. The pt was able to complete multiple rolls in bed, but required maxA to complete at this time despite cues and increased tactile and facilitation of cross-body reaching to assist with movement. The pt remained significantly limited by pain in BLE, that limited progression of further mobility as well as required increased time and assist for bed mobility and repositioning. The pt will continue to benefit from skilled PT to progress functional mobility and progress tolerance for OOB mobility.     Follow Up Recommendations SNF;Supervision/Assistance - 24 hour    Equipment Recommendations  Wheelchair (measurements PT);Wheelchair cushion (measurements PT)    Recommendations for Other Services       Precautions / Restrictions Precautions Precautions: Fall;Other (comment) Precaution Comments: significant pain and wounds to BLE Restrictions Weight Bearing Restrictions: No      Mobility  Bed Mobility Overal bed mobility: Needs Assistance Bed Mobility: Rolling Rolling: Max assist;+2 for physical assistance         General bed mobility comments: Max A +2 for rolling  in bed to clean after BM    Transfers                 General transfer comment: Defer due to pain            Pertinent Vitals/Pain Pain Assessment: 0-10 Pain Score: 10-Worst pain ever Pain Location: bilateral feet Pain Descriptors / Indicators: Grimacing;Discomfort;Crying;Moaning Pain Intervention(s): Limited activity within patient's tolerance;Monitored during session;Repositioned    Home Living Family/patient expects to be discharged to:: Private residence Living Arrangements: Other relatives (sister) Available Help at Discharge: Family Type of Home: House Home Access: Stairs to enter   Technical brewer of Steps: 1 Home Layout: Able to live on main level with bedroom/bathroom Home Equipment: Walker - 2 wheels;Grab bars - tub/shower;Hand held shower head;Bedside commode      Prior Function Level of Independence: Needs assistance   Gait / Transfers Assistance Needed: using RW  ADL's / Homemaking Assistance Needed: Performing BADLs but states sister could assist if needed. sister completes IADLs  Comments: pt stating "it wasn't this bad at home" when asked about how she was managing at home     Hand Dominance   Dominant Hand: Right    Extremity/Trunk Assessment   Upper Extremity Assessment Upper Extremity Assessment: Defer to OT evaluation;Generalized weakness    Lower Extremity Assessment Lower Extremity Assessment: Generalized weakness;RLE deficits/detail;LLE deficits/detail RLE Deficits / Details: significant wounds to foot, wrapped in bandage. Pt unable to move at knee or ankle due to pain, crying out with assisted hip abd/adduction to during rolling and pericare. pt able to perform SLR RLE: Unable to fully assess due to pain  LLE Deficits / Details: significant wounds to foot, wrapped in bandage. Pt unable to move at knee or ankle due to pain, crying out with assisted hip abd/adduction to during rolling and pericare. pt able to perform SLR LLE:  Unable to fully assess due to pain    Cervical / Trunk Assessment Cervical / Trunk Assessment: Normal  Communication   Communication: Other (comment) (soft spoken and mumbling)  Cognition Arousal/Alertness: Awake/alert (drowsy) Behavior During Therapy: WFL for tasks assessed/performed;Anxious Overall Cognitive Status: Impaired/Different from baseline Area of Impairment: Attention;Memory;Safety/judgement;Awareness;Problem solving;Following commands                   Current Attention Level: Focused;Sustained Memory: Decreased short-term memory;Decreased recall of precautions Following Commands: Follows one step commands inconsistently;Follows one step commands with increased time Safety/Judgement: Decreased awareness of safety;Decreased awareness of deficits Awareness: Intellectual Problem Solving: Slow processing;Requires verbal cues;Difficulty sequencing General Comments: Highly distracted by anxiety. Requiring Max cues for following simple commands and decrease anxiety.      General Comments General comments (skin integrity, edema, etc.): BM in bed upon arrival, multiple wounds on L buttocks    Exercises     Assessment/Plan    PT Assessment Patient needs continued PT services  PT Problem List Decreased strength;Decreased range of motion;Decreased activity tolerance;Decreased balance;Decreased mobility;Pain       PT Treatment Interventions DME instruction;Gait training;Stair training;Functional mobility training;Therapeutic activities;Therapeutic exercise;Balance training;Patient/family education    PT Goals (Current goals can be found in the Care Plan section)  Acute Rehab PT Goals Patient Stated Goal: Reduce pain PT Goal Formulation: With patient Time For Goal Achievement: 09/17/20 Potential to Achieve Goals: Good    Frequency Min 3X/week   Barriers to discharge        Co-evaluation PT/OT/SLP Co-Evaluation/Treatment: Yes Reason for Co-Treatment: For  patient/therapist safety;To address functional/ADL transfers PT goals addressed during session: Mobility/safety with mobility;Strengthening/ROM OT goals addressed during session: ADL's and self-care       AM-PAC PT "6 Clicks" Mobility  Outcome Measure Help needed turning from your back to your side while in a flat bed without using bedrails?: Total Help needed moving from lying on your back to sitting on the side of a flat bed without using bedrails?: Total Help needed moving to and from a bed to a chair (including a wheelchair)?: Total Help needed standing up from a chair using your arms (e.g., wheelchair or bedside chair)?: Total Help needed to walk in hospital room?: Total Help needed climbing 3-5 steps with a railing? : Total 6 Click Score: 6    End of Session   Activity Tolerance: Treatment limited secondary to agitation Patient left: in bed;with call bell/phone within reach;with bed alarm set Nurse Communication: Mobility status PT Visit Diagnosis: Other abnormalities of gait and mobility (R26.89);Muscle weakness (generalized) (M62.81)    Time: 4166-0630 PT Time Calculation (min) (ACUTE ONLY): 20 min   Charges:   PT Evaluation $PT Eval Low Complexity: 1 Low          Karma Ganja, PT, DPT   Acute Rehabilitation Department Pager #: [phone]  Otho Bellows 09/03/2020, 6:09 PM

## 2020-09-04 LAB — COMPREHENSIVE METABOLIC PANEL
ALT: 24 U/L (ref 0–44)
AST: 62 U/L — ABNORMAL HIGH (ref 15–41)
Albumin: 2.1 g/dL — ABNORMAL LOW (ref 3.5–5.0)
Alkaline Phosphatase: 89 U/L (ref 38–126)
Anion gap: 9 (ref 5–15)
BUN: 9 mg/dL (ref 6–20)
CO2: 22 mmol/L (ref 22–32)
Calcium: 10 mg/dL (ref 8.9–10.3)
Chloride: 108 mmol/L (ref 98–111)
Creatinine, Ser: 0.37 mg/dL — ABNORMAL LOW (ref 0.44–1.00)
GFR, Estimated: >60 mL/min (ref >60)
Glucose, Bld: 109 mg/dL — ABNORMAL HIGH (ref 70–99)
Potassium: 3.6 mmol/L (ref 3.5–5.1)
Sodium: 139 mmol/L (ref 135–145)
Total Bilirubin: 0.7 mg/dL (ref 0.3–1.2)
Total Protein: 6.7 g/dL (ref 6.5–8.1)

## 2020-09-04 LAB — CBC
HCT: 24.2 % — ABNORMAL LOW (ref 36.0–46.0)
Hemoglobin: 7.3 g/dL — ABNORMAL LOW (ref 12.0–15.0)
MCH: 24.8 pg — ABNORMAL LOW (ref 26.0–34.0)
MCHC: 30.2 g/dL (ref 30.0–36.0)
MCV: 82.3 fL (ref 80.0–100.0)
Platelets: 152 10*3/uL (ref 150–400)
RBC: 2.94 MIL/uL — ABNORMAL LOW (ref 3.87–5.11)
RDW: 16.5 % — ABNORMAL HIGH (ref 11.5–15.5)
WBC: 4.2 10*3/uL (ref 4.0–10.5)
nRBC: 0 % (ref 0.0–0.2)

## 2020-09-04 LAB — TSH: TSH: <0.01 u[IU]/mL — ABNORMAL LOW (ref 0.350–4.500)

## 2020-09-04 LAB — T4, FREE: Free T4: 1.72 ng/dL — ABNORMAL HIGH (ref 0.61–1.12)

## 2020-09-04 MED ORDER — DOXYCYCLINE HYCLATE 100 MG PO TABS
100.0000 mg | ORAL_TABLET | Freq: Two times a day (BID) | ORAL | Status: DC
  Administered 2020-09-05 – 2020-09-13 (×18): 100 mg via ORAL
  Filled 2020-09-04 (×18): qty 1

## 2020-09-04 MED ORDER — PROPRANOLOL HCL 40 MG PO TABS
40.0000 mg | ORAL_TABLET | Freq: Four times a day (QID) | ORAL | Status: DC
  Administered 2020-09-04 – 2020-09-09 (×19): 40 mg via ORAL
  Filled 2020-09-04 (×21): qty 1

## 2020-09-04 NOTE — TOC Initial Note (Addendum)
Transition of Care [facility]) - Initial/Assessment Note    Patient Details  Name: Candace Robinson MRN: [id number] Date of Birth: 06/03/1966  [facility] CM/SW Contact:    Emeterio Reeve, [place] Phone Number: 09/04/2020, 11:45 AM  Clinical Narrative:                  CSW spoke to pt and sister Candace Robinson at bedside. Candace Robinson stated that PTA pt was living at home with her and has for the last couple months. Candace Robinson stated pt uses a walker sometimes for mobility. Pt is able to fed and wash and dress herself. Candace Robinson reports pt does not have insurance but they applied for medicaid in November in [facility] but has not spoken with anyone since. Candace Robinson reports pt is covid vaccinated with the booster.   CSW reviewed pt/ot reccs with pt and Candace Robinson. They agreed that pt needs SNF to help build strength before returning home. They have no preference on facility. CSW explained medicare.gov rating list to family. CSW emailed financial counseling to screen pt for medicaid.    TOC to follow.   Expected Discharge Plan: Skilled Nursing Facility Barriers to Discharge: Continued Medical Work up,SNF Pending Medicaid,Inadequate or no insurance   Patient Goals and CMS Choice Patient states their goals for this hospitalization and ongoing recovery are:: to get better CMS Medicare.gov Compare Post Acute Care list provided to:: Patient Choice offered to / list presented to : Patient  Expected Discharge Plan and Services Expected Discharge Plan: [place] arrangements for the past 2 months: Single Family Home                                      Prior Living Arrangements/Services Living arrangements for the past 2 months: Single Family Home Lives with:: Siblings Patient language and need for interpreter reviewed:: Yes Do you feel safe going back to the place where you live?: Yes      Need for Family Participation in Patient Care: Yes (Comment) Care  giver support system in place?: Yes (comment) Current home services: DME Criminal Activity/Legal Involvement Pertinent to Current Situation/Hospitalization: No - Comment as needed  Activities of Daily Living      Permission Sought/Granted Permission sought to share information with : Case Manager Permission granted to share information with : Yes, Verbal Permission Granted     Permission granted to share info w AGENCY: SNF  Permission granted to share info w Relationship: Sister- Candace Robinson     Emotional Assessment Appearance:: Appears stated age Attitude/Demeanor/Rapport: Engaged Affect (typically observed): Appropriate Orientation: : Oriented to Self Alcohol / Substance Use: Not Applicable Psych Involvement: No (comment)  Admission diagnosis:  Encephalopathy [G93.40] Wound infection [T14.8XXA, L08.9] Acute encephalopathy [G93.40] Atrial fibrillation with RVR (HCC) [I48.91] Ulcers of both lower extremities ([place]) [L97.919, L97.929] Sepsis ([place]) [A41.9] Patient Active Problem List   Diagnosis Date Noted  . Thyrotoxicosis 09/01/2020  . Atrial fibrillation with RVR ([place])   . Ulcers of both lower extremities ([place])   . Acute encephalopathy 08/31/2020  . Sepsis ([place]) 08/31/2020   PCP:  Patient, No Pcp Per Pharmacy:   [facility], [place] [address] [place] [address] Phone: [phone] Fax: [phone]  [id number] [place], [place] - [address] [place] [address]  [place] [address] Phone: [phone] Fax: [phone]     Social Determinants of Health (SDOH) Interventions    Readmission Risk Interventions No flowsheet data found.  Emeterio Reeve, Latanya Presser, [place] Social Worker [phone]

## 2020-09-04 NOTE — Progress Notes (Signed)
  Date: 09/04/2020  Patient name: Candace Robinson  Medical record number: [id number]  Date of birth: [birth year]        I have seen and evaluated this patient and I have discussed the plan of care with the house staff. Please see Dr. Dorothyann Peng note for complete details. I concur with her findings and plan.  Patient is slowly improving.  Will likely need rehab/SNF stay.  She is currently uninsured which will be a barrier.    Sid Falcon, MD 09/04/2020, 7:31 PM

## 2020-09-04 NOTE — Progress Notes (Signed)
HD#4 Subjective:  Overnight Events:  Candace Robinson states that she feels well today. Does not recall having diarrhea although was told she had a loose stool overnight. She denies fevers, chills, confusion, fatigue. Denies pain currently in her feet and says the bandages will be changed later today. Notes she did have a wound on her buttock but denies any wound on her thighs.   Objective:  Vital signs in last 24 hours: Vitals:   09/03/20 1615 09/03/20 2054 09/04/20 0514 09/04/20 0954  BP: 120/63 121/69 114/66 (!) 104/58  Pulse: 65 79 78 78  Resp: 18 16 15 18   Temp: 98.2 F (36.8 C) 98.2 F (36.8 C) 98.5 F (36.9 C) 97.8 F (36.6 C)  TempSrc: Oral Oral Oral Oral  SpO2: 97% 95% 100% 96%  Weight:      Height:       Supplemental O2: Room air, saturating well  Physical Exam:  Constitutional: Ill-appearing female HENT: normocephalic atraumatic, Eyes: conjunctiva non-erythematous Neck: supple Cardiovascular: 2/6 Systolic murmur present on exam, best heard on left 2nd intercostal space. Regular rate and rhythm.  Pulmonary/Chest: Clear to auscultation bilaterally. No wheezing, rales, or rhonchi.  MSK: normal bulk and tone Neurological: Alert and oriented x 3 Skin: Patient has warm extremities. 1+-2+ pitting edema bilaterally in her lower extremities. Lower extremity wounds evaluated while dressing change, see picture           Filed Weights   08/31/20 1042  Weight: 84.8 kg     Intake/Output Summary (Last 24 hours) at 09/04/2020 1732 Last data filed at 09/04/2020 1100 Gross per 24 hour  Intake -  Output 1200 ml  Net -1200 ml   Net IO Since Admission: 476.09 mL [09/04/20 1732]  Pertinent Labs: CBC Latest Ref Rng & Units 09/04/2020 09/03/2020 09/02/2020  WBC 4.0 - 10.5 K/uL 4.2 6.2 6.2  Hemoglobin 12.0 - 15.0 g/dL 7.3(L) 7.1(L) 7.4(L)  Hematocrit 36.0 - 46.0 % 24.2(L) 24.0(L) 24.2(L)  Platelets 150 - 400 K/uL 152 134(L) 162    CMP Latest Ref Rng & Units 09/04/2020  09/03/2020 09/02/2020  Glucose 70 - 99 mg/dL 109(H) 119(H) 154(H)  BUN 6 - 20 mg/dL 9 13 14   Creatinine 0.44 - 1.00 mg/dL 0.37(L) 0.49 0.52  Sodium 135 - 145 mmol/L 139 137 139  Potassium 3.5 - 5.1 mmol/L 3.6 3.3(L) 3.7  Chloride 98 - 111 mmol/L 108 107 108  CO2 22 - 32 mmol/L 22 23 24   Calcium 8.9 - 10.3 mg/dL 10.0 9.6 9.7  Total Protein 6.5 - 8.1 g/dL 6.7 6.8 7.1  Total Bilirubin 0.3 - 1.2 mg/dL 0.7 1.0 0.7  Alkaline Phos 38 - 126 U/L 89 75 75  AST 15 - 41 U/L 62(H) 37 44(H)  ALT 0 - 44 U/L 24 21 25     Imaging: No recent imaging today   Assessment/Plan:   Active Problems:   Acute encephalopathy   Sepsis (HCC)   Atrial fibrillation with RVR (HCC)   Ulcers of both lower extremities (HCC)   Thyrotoxicosis   Patient Summary: Candace Robinson is a 55 y.o. with a pertinent PMH of CHF, A. fib, hypertension, type 2 diabetes, venous insufficiency with lower extremity ulcers, who presented with altered mental status and lower extremity wounds and admitted for severe thyrotoxicosis.   #Thyrotoxicosis, severe #Acute Encephalopathy, multifactorial- improved #Supraventricular tachycardia, sinus tachycardia Patient continues to be hemodynamically stable.  Has tolerated transition from PTU to methimazole well, continues on propanolol.  TSH<0.01, free T4 1.72, T3 pending.  Will adjust methimazole as needed for patient to achieve euthyroid state  -continue methimazole 20 mg BID -continue propranolol -Follow-up T3 -HR goal rate of less than 110. -thyroid US showed stable thyromegaly without nodules -delirium precautions  Hypercalcemia: Corrected calcium of 11.1 mg/dl  Stable when compared to recent labs.   -Status post single dose of pamidronate 60 mg -PTH 20 (low-normal) and vitamin D low, appropriate response to hypocalcmiea -daily CMP  HFpEF, Echo 50-55% Patient has a history of congestive heart failure with no previous echoes in her chart. Echo during current admission, EF 50-55%,  no regional wall abnormalities. Mild/moderate aortic valve stenosis (mean gradient of 21.5 mmHg). Uncertain if echo reflects patient's baseline or if exacerbated by thyrotoxicosis. Will need repeat echo on outpatient basis after stabilization of thyroid disease.   -will restart home diltiazem once she is more stable from thyrotoxicosis and the propranolol is discontinued  Lower extremity ulcers, cellulitis vs osteomyelitis: Presents with chronic lower extremity ulcers with concern for purulent cellulitis.  MRI of right foot performed without evidence of osteomyelitis. Possible phlegmon/abscess of right foot.  Upon our exam patient's feet had just been bandaged. Left foot MRI showed no evidence of osteomyelitis, no loculated fluid collections, suggestive of cellulitis.   -Transition from vancomycin (5 days) to doxycycline for soft tissue infection (day 6 of antibiotics) -pain med regimen - scheduled tylenol 650 mg q6h with oxycodone 5 mg q4h for breakthrough pain. -wound care consult in place -blood cultures and urine cultures no growth thus far.  Normocytic Anemia: Appears secondary to iron deficiency anemia.  Will supplement with iron once patient is more stable and infection has been ruled out. - monitor closely.   SLE Patient with Hx of SLE, patient with skin lesions on scalp as well as upper thighs consistent with cutaneous manifestation. Will to continue to monitor.  Diet: Regular diet, thin IVF: None,None VTE: Enoxaparin Code: Full PT/OT recs: Pending  TOC recs: None Family Update: Updated family today  Dispo: Anticipated discharge in 3 days pending further work-up.   Candace Robinson, M.D. [facility] 09/04/2020 5:38 PM

## 2020-09-05 DIAGNOSIS — D72819 Decreased white blood cell count, unspecified: Secondary | ICD-10-CM

## 2020-09-05 DIAGNOSIS — L97221 Non-pressure chronic ulcer of left calf limited to breakdown of skin: Secondary | ICD-10-CM

## 2020-09-05 DIAGNOSIS — L97211 Non-pressure chronic ulcer of right calf limited to breakdown of skin: Secondary | ICD-10-CM

## 2020-09-05 DIAGNOSIS — D61818 Other pancytopenia: Secondary | ICD-10-CM | POA: Diagnosis not present

## 2020-09-05 DIAGNOSIS — L089 Local infection of the skin and subcutaneous tissue, unspecified: Secondary | ICD-10-CM | POA: Diagnosis present

## 2020-09-05 DIAGNOSIS — D696 Thrombocytopenia, unspecified: Secondary | ICD-10-CM

## 2020-09-05 LAB — CBC WITH DIFFERENTIAL/PLATELET
Abs Immature Granulocytes: 0.02 10*3/uL (ref 0.00–0.07)
Basophils Absolute: 0 10*3/uL (ref 0.0–0.1)
Basophils Relative: 0 %
Eosinophils Absolute: 0.1 10*3/uL (ref 0.0–0.5)
Eosinophils Relative: 2 %
HCT: 24 % — ABNORMAL LOW (ref 36.0–46.0)
Hemoglobin: 7.3 g/dL — ABNORMAL LOW (ref 12.0–15.0)
Immature Granulocytes: 1 %
Lymphocytes Relative: 25 %
Lymphs Abs: 0.6 10*3/uL — ABNORMAL LOW (ref 0.7–4.0)
MCH: 24.6 pg — ABNORMAL LOW (ref 26.0–34.0)
MCHC: 30.4 g/dL (ref 30.0–36.0)
MCV: 80.8 fL (ref 80.0–100.0)
Monocytes Absolute: 0.2 10*3/uL (ref 0.1–1.0)
Monocytes Relative: 10 %
Neutro Abs: 1.4 10*3/uL — ABNORMAL LOW (ref 1.7–7.7)
Neutrophils Relative %: 62 %
Platelets: 111 10*3/uL — ABNORMAL LOW (ref 150–400)
RBC: 2.97 MIL/uL — ABNORMAL LOW (ref 3.87–5.11)
RDW: 16.6 % — ABNORMAL HIGH (ref 11.5–15.5)
WBC: 2.3 10*3/uL — ABNORMAL LOW (ref 4.0–10.5)
nRBC: 0 % (ref 0.0–0.2)

## 2020-09-05 LAB — CBC
HCT: 23.9 % — ABNORMAL LOW (ref 36.0–46.0)
HCT: 25.8 % — ABNORMAL LOW (ref 36.0–46.0)
Hemoglobin: 7.5 g/dL — ABNORMAL LOW (ref 12.0–15.0)
Hemoglobin: 7.6 g/dL — ABNORMAL LOW (ref 12.0–15.0)
MCH: 25.3 pg — ABNORMAL LOW (ref 26.0–34.0)
MCH: 24 pg — ABNORMAL LOW (ref 26.0–34.0)
MCHC: 31.4 g/dL (ref 30.0–36.0)
MCHC: 29.5 g/dL — ABNORMAL LOW (ref 30.0–36.0)
MCV: 80.5 fL (ref 80.0–100.0)
MCV: 81.4 fL (ref 80.0–100.0)
Platelets: 126 10*3/uL — ABNORMAL LOW (ref 150–400)
Platelets: 111 10*3/uL — ABNORMAL LOW (ref 150–400)
RBC: 2.97 MIL/uL — ABNORMAL LOW (ref 3.87–5.11)
RBC: 3.17 MIL/uL — ABNORMAL LOW (ref 3.87–5.11)
RDW: 16.6 % — ABNORMAL HIGH (ref 11.5–15.5)
RDW: 16.5 % — ABNORMAL HIGH (ref 11.5–15.5)
WBC: 2.1 10*3/uL — ABNORMAL LOW (ref 4.0–10.5)
WBC: 2.3 10*3/uL — ABNORMAL LOW (ref 4.0–10.5)
nRBC: 0 % (ref 0.0–0.2)
nRBC: 0 % (ref 0.0–0.2)

## 2020-09-05 LAB — COMPREHENSIVE METABOLIC PANEL
ALT: 26 U/L (ref 0–44)
AST: 62 U/L — ABNORMAL HIGH (ref 15–41)
Albumin: 2.1 g/dL — ABNORMAL LOW (ref 3.5–5.0)
Alkaline Phosphatase: 126 U/L (ref 38–126)
Anion gap: 7 (ref 5–15)
BUN: 8 mg/dL (ref 6–20)
CO2: 22 mmol/L (ref 22–32)
Calcium: 9.6 mg/dL (ref 8.9–10.3)
Chloride: 106 mmol/L (ref 98–111)
Creatinine, Ser: 0.43 mg/dL — ABNORMAL LOW (ref 0.44–1.00)
GFR, Estimated: >60 mL/min (ref >60)
Glucose, Bld: 113 mg/dL — ABNORMAL HIGH (ref 70–99)
Potassium: 4 mmol/L (ref 3.5–5.1)
Sodium: 135 mmol/L (ref 135–145)
Total Bilirubin: 0.9 mg/dL (ref 0.3–1.2)
Total Protein: 6.8 g/dL (ref 6.5–8.1)

## 2020-09-05 LAB — CULTURE, BLOOD (ROUTINE X 2)
Culture: NO GROWTH
Culture: NO GROWTH

## 2020-09-05 LAB — DIC (DISSEMINATED INTRAVASCULAR COAGULATION)PANEL
D-Dimer, Quant: 1.4 ug/mL-FEU — ABNORMAL HIGH (ref 0.00–0.50)
Fibrinogen: 390 mg/dL (ref 210–475)
INR: 1.4 — ABNORMAL HIGH (ref 0.8–1.2)
Platelets: 110 10*3/uL — ABNORMAL LOW (ref 150–400)
Prothrombin Time: 16.2 seconds — ABNORMAL HIGH (ref 11.4–15.2)
Smear Review: NONE SEEN
aPTT: 32 seconds (ref 24–36)

## 2020-09-05 LAB — RETICULOCYTES
Immature Retic Fract: 9.8 % (ref 2.3–15.9)
RBC.: 3.09 MIL/uL — ABNORMAL LOW (ref 3.87–5.11)
Retic Count, Absolute: 42.3 10*3/uL (ref 19.0–186.0)
Retic Ct Pct: 1.4 % (ref 0.4–3.1)

## 2020-09-05 LAB — T3: T3, Total: 247 ng/dL — ABNORMAL HIGH (ref 71–180)

## 2020-09-05 LAB — SAVE SMEAR(SSMR), FOR PROVIDER SLIDE REVIEW

## 2020-09-05 MED ORDER — HYDROMORPHONE HCL 1 MG/ML IJ SOLN
0.5000 mg | Freq: Once | INTRAMUSCULAR | Status: AC
  Administered 2020-09-05: 0.5 mg via INTRAVENOUS
  Filled 2020-09-05: qty 0.5

## 2020-09-05 NOTE — Plan of Care (Signed)
  Problem: Activity: Goal: Risk for activity intolerance will decrease Outcome: Progressing   Problem: Nutrition: Goal: Adequate nutrition will be maintained Outcome: Progressing   Problem: Coping: Goal: Level of anxiety will decrease Outcome: Progressing   Problem: Pain Managment: Goal: General experience of comfort will improve Outcome: Progressing   Problem: Safety: Goal: Ability to remain free from injury will improve Outcome: Progressing   

## 2020-09-05 NOTE — Progress Notes (Signed)
Bilateral foot wounds redressed with silvadene, vaseline gauze, abd pad, kerlix

## 2020-09-05 NOTE — Progress Notes (Addendum)
HD#5 Subjective:  Overnight Events: No acute events overnight  Patient states she feels well this morning.  She continues to endorse pain from her lower extremity wounds.  Denies new episodes of fever, chills, shortness of breath or chest pain.  Discussed with her that there was some concern of her platelets, white blood cells, and red blood cells decreasing.  We plan to perform work-up as methimazole may be causing these to drop.  Objective:  Vital signs in last 24 hours: Vitals:   09/04/20 1939 09/04/20 2350 09/05/20 0507 09/05/20 0742  BP: 102/64 120/74 118/76 103/66  Pulse: 81 77 73 71  Resp: 16  16 18   Temp: 98.1 F (36.7 C)  98.8 F (37.1 C) 99.3 F (37.4 C)  TempSrc:   Oral Oral  SpO2: 93%  96% 97%  Weight:      Height:       Supplemental O2: Room air, saturating well  Physical Exam:  Constitutional: Ill-appearing female HENT: normocephalic atraumatic, Eyes: conjunctiva non-erythematous Neck: supple Cardiovascular: 2/6 Systolic murmur present on exam, best heard on left 2nd intercostal space. Regular rate and rhythm.  Pulmonary/Chest: No acute respiratory distress. MSK: normal bulk and tone Neurological: Alert and oriented x 3 Skin: Patient has warm extremities. 1+-2+ pitting edema bilaterally in her lower extremities. Lower extremity wounds evaluated while dressing change, see picture                Filed Weights   08/31/20 1042  Weight: 84.8 kg     Intake/Output Summary (Last 24 hours) at 09/05/2020 1350 Last data filed at 09/05/2020 1300 Gross per 24 hour  Intake 480 ml  Output 900 ml  Net -420 ml   Net IO Since Admission: 56.09 mL [09/05/20 1350]  Pertinent Labs: CBC Latest Ref Rng & Units 09/05/2020 09/05/2020 09/05/2020  WBC 4.0 - 10.5 K/uL - 2.3(L) 2.3(L)  Hemoglobin 12.0 - 15.0 g/dL - 7.6(L) 7.3(L)  Hematocrit 36.0 - 46.0 % - 25.8(L) 24.0(L)  Platelets 150 - 400 K/uL 110(L) 111(L) 111(L)    CMP Latest Ref Rng & Units 09/05/2020 09/04/2020  09/03/2020  Glucose 70 - 99 mg/dL 113(H) 109(H) 119(H)  BUN 6 - 20 mg/dL 8 9 13   Creatinine 0.44 - 1.00 mg/dL 0.43(L) 0.37(L) 0.49  Sodium 135 - 145 mmol/L 135 139 137  Potassium 3.5 - 5.1 mmol/L 4.0 3.6 3.3(L)  Chloride 98 - 111 mmol/L 106 108 107  CO2 22 - 32 mmol/L 22 22 23   Calcium 8.9 - 10.3 mg/dL 9.6 10.0 9.6  Total Protein 6.5 - 8.1 g/dL 6.8 6.7 6.8  Total Bilirubin 0.3 - 1.2 mg/dL 0.9 0.7 1.0  Alkaline Phos 38 - 126 U/L 126 89 75  AST 15 - 41 U/L 62(H) 62(H) 37  ALT 0 - 44 U/L 26 24 21     Imaging: No recent imaging today   Assessment/Plan:   Active Problems:   Acute encephalopathy   Sepsis (HCC)   Atrial fibrillation with RVR (HCC)   Ulcers of both lower extremities (HCC)   Thyrotoxicosis   Patient Summary: Candace Robinson is a 55 y.o. with a pertinent PMH of CHF, A. fib, hypertension, type 2 diabetes, venous insufficiency with lower extremity ulcers, who presented with altered mental status and lower extremity wounds and admitted for severe thyrotoxicosis.   #Thyrotoxicosis, severe #Acute Encephalopathy, multifactorial- improved #Supraventricular tachycardia, sinus tachycardia Patient continues to be hemodynamically stable.  Transition to methimazole 2 days ago.  Plan to repeat TSH and free  T4/T3 tomorrow.  Patient found to have decrease in platelets, white blood cells, red blood cells.  Consistent with pancytopenia.  My concern is that this is the beginning of an bone marrow suppression/agranulocytosis secondary to her methimazole.  If worsening pancytopenia, will stop methimazole and transition back to PTU.  At this time also consult general surgery for possible thyroidectomy. -Repeat CBC in the morning -If continues to be pancytopenic, switch back to PTU.  Consult GEN surge for potential thyroidectomy -continue methimazole 20 mg BID -continue propranolol in context of possibly having to switch back to PTU -Follow-up T3 -HR goal rate of less than 110. -thyroid US  showed stable thyromegaly without nodules -delirium precautions  Pancytopenia Patient has been leukopenic with a low of 2.1.  She has been anemic since admission, thought to be secondary to anemia of chronic disease and iron deficiency anemia.  We have held off on giving iron supplementation in setting of potential lower extremity infection.  Of note she has also had decrease in platelets over the past day.  These changes occurred close to when methimazole was started, leading to my suspicion that she may have some bone marrow suppression from the medication.  In setting of thrombocytopenia, DIC panel was ordered.  This did reveal a normal fibrinogen of 390.  She does have an mild elevated PTT and INR.  If continues to worsen we will stop VTE prophylaxis.  Blood smear without schistocytes.  Normal reticulocyte count.  Pathology smear review pending.  If continues to trend downward, discontinue methimazole per plan above. -Trend CBC, consider repeat DIC panel if worsening thrombocytopenia -DC methimazole if continues to downtrend. -If thrombocytopenia worsens, consider discontinuing VTE  Hypercalcemia: Corrected calcium of 11.1 mg/dl  Stable when compared to recent labs.  -Status post single dose of pamidronate 60 mg -PTH 20 (low-normal) and vitamin D low, appropriate response to hypocalcmiea -daily CMP  HFpEF, Echo 50-55% Patient has a history of congestive heart failure with no previous echoes in her chart. Echo during current admission, EF 50-55%, no regional wall abnormalities. Mild/moderate aortic valve stenosis (mean gradient of 21.5 mmHg). Uncertain if echo reflects patient's baseline or if exacerbated by thyrotoxicosis. Will need repeat echo on outpatient basis after stabilization of thyroid disease.  -will restart home diltiazem once she is more stable from thyrotoxicosis and the propranolol is discontinued  Lower extremity ulcers, cellulitis: Patient continues to have pain with lower  extremity ulcers.  Images as per above, appears to have worsening of bleeding.  Suspect this is in context of new pancytopenia.  Will consider to monitor bleeding.  Continue dressing changes per wound care, and plan for outpatient wound care follow-up.  Patient plan on being discharged to SNF facility. -Doxycycline 100 mg twice daily day 7 -pain med regimen - scheduled tylenol 650 mg q6h with oxycodone 5 mg q4h for breakthrough pain. -wound care consult in place -blood cultures and urine cultures no growth thus far.  Normocytic Anemia: Appears secondary to iron deficiency anemia.  Will supplement with iron once patient is more stable and infection has been ruled out. - monitor closely.   SLE Patient with Hx of SLE, patient with skin lesions on scalp as well as upper thighs consistent with cutaneous manifestation. Will to continue to monitor.  Diet: Regular diet, thin IVF: None,None VTE: Enoxaparin Code: Full PT/OT recs: Pending  TOC recs: None Family Update: Updated family today  Dispo: Anticipated discharge in 3 days pending further work-up.   Sanjuana Letters, M.D. Elissa Lovett 09/05/2020  1:50 PM

## 2020-09-06 DIAGNOSIS — L03115 Cellulitis of right lower limb: Secondary | ICD-10-CM

## 2020-09-06 DIAGNOSIS — D649 Anemia, unspecified: Secondary | ICD-10-CM

## 2020-09-06 DIAGNOSIS — D72819 Decreased white blood cell count, unspecified: Secondary | ICD-10-CM

## 2020-09-06 DIAGNOSIS — L03116 Cellulitis of left lower limb: Secondary | ICD-10-CM

## 2020-09-06 DIAGNOSIS — E0590 Thyrotoxicosis, unspecified without thyrotoxic crisis or storm: Secondary | ICD-10-CM

## 2020-09-06 DIAGNOSIS — L97919 Non-pressure chronic ulcer of unspecified part of right lower leg with unspecified severity: Secondary | ICD-10-CM

## 2020-09-06 DIAGNOSIS — D61818 Other pancytopenia: Secondary | ICD-10-CM

## 2020-09-06 DIAGNOSIS — L97929 Non-pressure chronic ulcer of unspecified part of left lower leg with unspecified severity: Secondary | ICD-10-CM

## 2020-09-06 LAB — CBC WITH DIFFERENTIAL/PLATELET
Abs Immature Granulocytes: 0.02 10*3/uL (ref 0.00–0.07)
Basophils Absolute: 0 10*3/uL (ref 0.0–0.1)
Basophils Relative: 1 %
Eosinophils Absolute: 0.1 10*3/uL (ref 0.0–0.5)
Eosinophils Relative: 3 %
HCT: 26.3 % — ABNORMAL LOW (ref 36.0–46.0)
Hemoglobin: 8.1 g/dL — ABNORMAL LOW (ref 12.0–15.0)
Immature Granulocytes: 1 %
Lymphocytes Relative: 41 %
Lymphs Abs: 1.3 10*3/uL (ref 0.7–4.0)
MCH: 24.9 pg — ABNORMAL LOW (ref 26.0–34.0)
MCHC: 30.8 g/dL (ref 30.0–36.0)
MCV: 80.9 fL (ref 80.0–100.0)
Monocytes Absolute: 0.5 10*3/uL (ref 0.1–1.0)
Monocytes Relative: 15 %
Neutro Abs: 1.3 10*3/uL — ABNORMAL LOW (ref 1.7–7.7)
Neutrophils Relative %: 39 %
Platelets: 98 10*3/uL — ABNORMAL LOW (ref 150–400)
RBC: 3.25 MIL/uL — ABNORMAL LOW (ref 3.87–5.11)
RDW: 16.8 % — ABNORMAL HIGH (ref 11.5–15.5)
WBC: 3.2 10*3/uL — ABNORMAL LOW (ref 4.0–10.5)
nRBC: 0 % (ref 0.0–0.2)

## 2020-09-06 LAB — COMPREHENSIVE METABOLIC PANEL
ALT: 23 U/L (ref 0–44)
AST: 49 U/L — ABNORMAL HIGH (ref 15–41)
Albumin: 2.2 g/dL — ABNORMAL LOW (ref 3.5–5.0)
Alkaline Phosphatase: 135 U/L — ABNORMAL HIGH (ref 38–126)
Anion gap: 7 (ref 5–15)
BUN: 7 mg/dL (ref 6–20)
CO2: 23 mmol/L (ref 22–32)
Calcium: 9.1 mg/dL (ref 8.9–10.3)
Chloride: 104 mmol/L (ref 98–111)
Creatinine, Ser: 0.42 mg/dL — ABNORMAL LOW (ref 0.44–1.00)
GFR, Estimated: >60 mL/min (ref >60)
Glucose, Bld: 101 mg/dL — ABNORMAL HIGH (ref 70–99)
Potassium: 4.4 mmol/L (ref 3.5–5.1)
Sodium: 134 mmol/L — ABNORMAL LOW (ref 135–145)
Total Bilirubin: 1 mg/dL (ref 0.3–1.2)
Total Protein: 6.7 g/dL (ref 6.5–8.1)

## 2020-09-06 LAB — CBC
HCT: 25.1 % — ABNORMAL LOW (ref 36.0–46.0)
Hemoglobin: 7.5 g/dL — ABNORMAL LOW (ref 12.0–15.0)
MCH: 24.1 pg — ABNORMAL LOW (ref 26.0–34.0)
MCHC: 29.9 g/dL — ABNORMAL LOW (ref 30.0–36.0)
MCV: 80.7 fL (ref 80.0–100.0)
Platelets: 107 10*3/uL — ABNORMAL LOW (ref 150–400)
RBC: 3.11 MIL/uL — ABNORMAL LOW (ref 3.87–5.11)
RDW: 16.7 % — ABNORMAL HIGH (ref 11.5–15.5)
WBC: 2.7 10*3/uL — ABNORMAL LOW (ref 4.0–10.5)
nRBC: 0 % (ref 0.0–0.2)

## 2020-09-06 LAB — TSH: TSH: <0.01 u[IU]/mL — ABNORMAL LOW (ref 0.350–4.500)

## 2020-09-06 LAB — T4, FREE: Free T4: 1.4 ng/dL — ABNORMAL HIGH (ref 0.61–1.12)

## 2020-09-06 MED ORDER — HYDROMORPHONE HCL 1 MG/ML IJ SOLN
INTRAMUSCULAR | Status: AC
  Filled 2020-09-06: qty 0.5

## 2020-09-06 MED ORDER — HYDROMORPHONE HCL 1 MG/ML IJ SOLN
0.5000 mg | Freq: Once | INTRAMUSCULAR | Status: AC
Start: 2020-09-06 — End: 2020-09-06
  Administered 2020-09-06: 0.5 mg via INTRAVENOUS

## 2020-09-06 NOTE — Progress Notes (Signed)
HD#6 Subjective:  Overnight Events: no overnight events.   Patient resting in bed eating.  She states that her pain is well controlled but still has difficulty tolerating wound changes.  Otherwise she denies any acute symptoms.  She denies any chest pain, shortness of breath, abdominal pain.  Her appetite is well and she endorses normal bowel movements.  Objective:  Vital signs in last 24 hours: Vitals:   09/06/20 0800 09/06/20 0815 09/06/20 1554 09/06/20 1842  BP:  (!) 100/51 (!) 97/55 104/67  Pulse:  75 68   Resp: 16 18 18    Temp:  98.5 F (36.9 C) 98.4 F (36.9 C)   TempSrc:  Oral Oral   SpO2: 100% 100% 99%   Weight:      Height:       Supplemental O2: Room Air SpO2: 99 % O2 Flow Rate (L/min): 2 L/min   Physical Exam:  Constitutional: Ill-appearing female HENT: normocephalic atraumatic, Eyes: conjunctiva non-erythematous Neck: supple Cardiovascular: 2/6 Systolic murmur present on exam, best heard on left 2nd intercostal space. Regular rate and rhythm.  Pulmonary/Chest: No acute respiratory distress. MSK: normal bulk and tone Neurological: Alert and oriented x 3 Skin: Patient has warm extremities. 1+-2+ pitting edema bilaterally in her lower extremities. Lower extremity wounds evaluated while dressing change, see picture  Filed Weights   08/31/20 1042  Weight: 84.8 kg     Intake/Output Summary (Last 24 hours) at 09/06/2020 1901 Last data filed at 09/06/2020 0900 Gross per 24 hour  Intake 360 ml  Output 300 ml  Net 60 ml   Net IO Since Admission: 356.09 mL [09/06/20 1901]  No results for input(s): GLUCAP in the last 72 hours.   Pertinent Labs: CBC Latest Ref Rng & Units 09/06/2020 09/06/2020 09/05/2020  WBC 4.0 - 10.5 K/uL 3.2(L) 2.7(L) -  Hemoglobin 12.0 - 15.0 g/dL 8.1(L) 7.5(L) -  Hematocrit 36.0 - 46.0 % 26.3(L) 25.1(L) -  Platelets 150 - 400 K/uL 98(L) 107(L) 110(L)    CMP Latest Ref Rng & Units 09/06/2020 09/05/2020 09/04/2020  Glucose 70 - 99 mg/dL 101(H)  113(H) 109(H)  BUN 6 - 20 mg/dL 7 8 9   Creatinine 0.44 - 1.00 mg/dL 0.42(L) 0.43(L) 0.37(L)  Sodium 135 - 145 mmol/L 134(L) 135 139  Potassium 3.5 - 5.1 mmol/L 4.4 4.0 3.6  Chloride 98 - 111 mmol/L 104 106 108  CO2 22 - 32 mmol/L 23 22 22   Calcium 8.9 - 10.3 mg/dL 9.1 9.6 10.0  Total Protein 6.5 - 8.1 g/dL 6.7 6.8 6.7  Total Bilirubin 0.3 - 1.2 mg/dL 1.0 0.9 0.7  Alkaline Phos 38 - 126 U/L 135(H) 126 89  AST 15 - 41 U/L 49(H) 62(H) 62(H)  ALT 0 - 44 U/L 23 26 24     Imaging: No results found.  Assessment/Plan:   Active Problems:   Acute encephalopathy   Sepsis ([place])   Atrial fibrillation with RVR (HCC)   Ulcers of both lower extremities (HCC)   Thyrotoxicosis   Wound infection   Other pancytopenia ([place])   Patient Summary: Candace Robinson is a 55 y.o. with a pertinent PMH of CHF, A. fib, hypertension, type 2 diabetes, venous insufficiency with lower extremity ulcers, who presented with altered mental status and lower extremity wounds and admitted for severe thyrotoxicosis.   #Thyrotoxicosis, severe #Pancytopenia: Patient is asymptomatic from a thyrotoxicosis standpoint.  She is tolerating methimazole well.  Although there is concerned that the patient may be having medication side effects including agranulocytosis.  We will continue to monitor the patient closely for pancytopenia.  No obvious signs of bleeding or bruising.  Will consider discontinuing DVT prophylaxis if platelets continue to drop. -Continue methimazole daily -Continue propranolol will need to switch back to home diltiazem tomorrow. -We will repeat CBC with differential tomorrow -If agranulocytosis is a concern then we will switch back to PTU and consult general surgery.  Hypercalcemia: Stable today -Status post pamidronate 60 mg and calcitonin -Continue daily CMP's  Lower extremity ulcers, cellulitis: Continues to have lower extremity wounds with significant pain.   -Continue doxycycline day 8 and  tolerating well  -Pain management will likely need further evaluation of her pain medications with breakthrough during wound care.   -Continue to trend blood cultures and urine cultures has no growth so far.  Normocytic Anemia: Likely iron deficiency anemia.  -We will supplement iron once patient no longer has infection. - monitor closely.   Diet: Regular diet, thin IVF: None,None VTE: Enoxaparin Code: Full PT/OT recs: Pending  TOC recs: None Family Update: Updated family today  Dispo: Anticipated discharge in 3 days pending further work-up.   Lawerance Cruel, D.O.  Internal Medicine Resident, PGY-2 [facility]  Pager: [phone] 7:01 PM, 09/06/2020   Please contact the on call pager after 5 pm and on weekends at [phone].

## 2020-09-07 DIAGNOSIS — D61818 Other pancytopenia: Secondary | ICD-10-CM

## 2020-09-07 LAB — CBC WITH DIFFERENTIAL/PLATELET
Abs Immature Granulocytes: 0.01 10*3/uL (ref 0.00–0.07)
Basophils Absolute: 0 10*3/uL (ref 0.0–0.1)
Basophils Relative: 0 %
Eosinophils Absolute: 0.2 10*3/uL (ref 0.0–0.5)
Eosinophils Relative: 5 %
HCT: 25 % — ABNORMAL LOW (ref 36.0–46.0)
Hemoglobin: 7.9 g/dL — ABNORMAL LOW (ref 12.0–15.0)
Immature Granulocytes: 0 %
Lymphocytes Relative: 46 %
Lymphs Abs: 1.7 10*3/uL (ref 0.7–4.0)
MCH: 25.3 pg — ABNORMAL LOW (ref 26.0–34.0)
MCHC: 31.6 g/dL (ref 30.0–36.0)
MCV: 80.1 fL (ref 80.0–100.0)
Monocytes Absolute: 0.7 10*3/uL (ref 0.1–1.0)
Monocytes Relative: 17 %
Neutro Abs: 1.3 10*3/uL — ABNORMAL LOW (ref 1.7–7.7)
Neutrophils Relative %: 32 %
Platelets: 118 10*3/uL — ABNORMAL LOW (ref 150–400)
RBC: 3.12 MIL/uL — ABNORMAL LOW (ref 3.87–5.11)
RDW: 16.8 % — ABNORMAL HIGH (ref 11.5–15.5)
WBC: 3.9 10*3/uL — ABNORMAL LOW (ref 4.0–10.5)
nRBC: 0 % (ref 0.0–0.2)

## 2020-09-07 LAB — COMPREHENSIVE METABOLIC PANEL
ALT: 23 U/L (ref 0–44)
AST: 45 U/L — ABNORMAL HIGH (ref 15–41)
Albumin: 2.1 g/dL — ABNORMAL LOW (ref 3.5–5.0)
Alkaline Phosphatase: 134 U/L — ABNORMAL HIGH (ref 38–126)
Anion gap: 8 (ref 5–15)
BUN: 8 mg/dL (ref 6–20)
CO2: 20 mmol/L — ABNORMAL LOW (ref 22–32)
Calcium: 8.5 mg/dL — ABNORMAL LOW (ref 8.9–10.3)
Chloride: 106 mmol/L (ref 98–111)
Creatinine, Ser: 0.48 mg/dL (ref 0.44–1.00)
GFR, Estimated: >60 mL/min (ref >60)
Glucose, Bld: 95 mg/dL (ref 70–99)
Potassium: 4.1 mmol/L (ref 3.5–5.1)
Sodium: 134 mmol/L — ABNORMAL LOW (ref 135–145)
Total Bilirubin: 0.7 mg/dL (ref 0.3–1.2)
Total Protein: 7 g/dL (ref 6.5–8.1)

## 2020-09-07 LAB — T3, FREE: T3, Free: 5 pg/mL — ABNORMAL HIGH (ref 2.0–4.4)

## 2020-09-07 LAB — CREATININE, SERUM
Creatinine, Ser: 0.49 mg/dL (ref 0.44–1.00)
GFR, Estimated: >60 mL/min (ref >60)

## 2020-09-07 LAB — PATHOLOGIST SMEAR REVIEW

## 2020-09-07 MED ORDER — SENNOSIDES-DOCUSATE SODIUM 8.6-50 MG PO TABS: 1.0000 | ORAL_TABLET | Freq: Every evening | ORAL | Status: DC | PRN

## 2020-09-07 NOTE — Progress Notes (Signed)
Physical Therapy Treatment Patient Details Name: Candace Robinson MRN: [id number] DOB: 09-14-1965 Today's Date: 09/07/2020    History of Present Illness The pt is a 55 yo female who presenting 2/28 due to concerns of BLE ulcers and AMS. 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 PMH includes: CHF, afib, HTN, DM II, lupus, heart mumur, and chronic venous insufficiency.    PT Comments    Pt more alert and conversant, not agitated. Pt able to follow commands with increased time. Pt did transfer to EOB with modAx2 and attempt LE exercises in sitting prior to onset of 10/10 burning/throbbing pain in bilat LEs. Pt will need a lift to transfer OOB to chair at this time as pt unable to use UEs to laterally scoot to chair. Acute PT to cont to follow.    Follow Up Recommendations  SNF;Supervision/Assistance - 24 hour     Equipment Recommendations  Wheelchair (measurements PT);Wheelchair cushion (measurements PT)    Recommendations for Other Services       Precautions / Restrictions Precautions Precautions: Fall;Other (comment) Precaution Comments: significant pain and wounds to BLE Restrictions Weight Bearing Restrictions: No    Mobility  Bed Mobility Overal bed mobility: Needs Assistance Bed Mobility: Supine to Sit     Supine to sit: Mod assist;+2 for physical assistance     General bed mobility comments: modA for LE management off bed and then to pivot to EOB, pt pulled self up on bed rail into long sit, pt only able to tolerate 1 minute of sitting with LEs in dependent position prior to intense pain, burning/throbbing having to return to supine, pt crying in pain, did attempte AA LAQ to help with blood flow and ankle pumps however remained in intense pain    Transfers                 General transfer comment: Defer due to pain, suspect pt unable to tolerate WBing through her feet or in dependent position, will need to either slide/lateral scoot or use a  lift  Ambulation/Gait                 Stairs             Wheelchair Mobility    Modified Rankin (Stroke Patients Only)       Balance Overall balance assessment: Needs assistance Sitting-balance support: Feet supported;Bilateral upper extremity supported Sitting balance-Leahy Scale: Poor Sitting balance - Comments: pt with posterior lean                                    Cognition Arousal/Alertness: Awake/alert (drowsy) Behavior During Therapy: WFL for tasks assessed/performed;Anxious Overall Cognitive Status: Impaired/Different from baseline Area of Impairment: Attention;Memory;Safety/judgement;Awareness;Problem solving;Following commands                   Current Attention Level: Sustained Memory: Decreased short-term memory Following Commands: Follows one step commands with increased time;Follows one step commands consistently   Awareness: Emergent Problem Solving: Slow processing;Requires verbal cues;Difficulty sequencing General Comments: pt slow to respond but cooperative      Exercises General Exercises - Lower Extremity Ankle Circles/Pumps: AROM;Both;10 reps;Seated Quad Sets: AROM;Both;10 reps;Supine Gluteal Sets: AROM;Both;10 reps;Supine Long Arc Quad: AAROM;Both;5 reps;Seated    General Comments General comments (skin integrity, edema, etc.): pt's bilat feet in dressings, known bilat foot ulcers, not observed      Pertinent Vitals/Pain Pain Assessment: 0-10 Pain Score:  10-Worst pain ever (with feet in dependent position) Pain Location: bilateral feet Pain Descriptors / Indicators: Grimacing;Discomfort;Crying;Moaning Pain Intervention(s): Limited activity within patient's tolerance    Home Living                      Prior Function            PT Goals (current goals can now be found in the care plan section) Acute Rehab PT Goals Patient Stated Goal: Reduce pain PT Goal Formulation: With patient Time  For Goal Achievement: 09/17/20 Potential to Achieve Goals: Good Progress towards PT goals: Progressing toward goals    Frequency    Min 2X/week      PT Plan Current plan remains appropriate    Co-evaluation              AM-PAC PT "6 Clicks" Mobility   Outcome Measure  Help needed turning from your back to your side while in a flat bed without using bedrails?: Total Help needed moving from lying on your back to sitting on the side of a flat bed without using bedrails?: Total Help needed moving to and from a bed to a chair (including a wheelchair)?: Total Help needed standing up from a chair using your arms (e.g., wheelchair or bedside chair)?: Total Help needed to walk in hospital room?: Total Help needed climbing 3-5 steps with a railing? : Total 6 Click Score: 6    End of Session   Activity Tolerance: Patient limited by pain Patient left: in bed;with call bell/phone within reach;with bed alarm set Nurse Communication: Mobility status PT Visit Diagnosis: Other abnormalities of gait and mobility (R26.89);Muscle weakness (generalized) (M62.81)     Time: 1610-9604 PT Time Calculation (min) (ACUTE ONLY): 17 min  Charges:  $Therapeutic Activity: 8-22 mins                     Kittie Plater, PT, DPT Acute Rehabilitation Services Pager #: [phone] Office #: [phone]    Berline Lopes 09/07/2020, 1:35 PM

## 2020-09-07 NOTE — Progress Notes (Signed)
HD#7 Subjective:  Overnight Events: no overnight events.   Patient resting in bed eating.  She states that her pain is well controlled but still has difficulty tolerating wound changes. She notes she has had bruising on her arms and legs that is new. She notes pain at her IV site on her right hand. Her feet continue to be painful between wound changes.  Denies all other concerns at this time  Objective:  Vital signs in last 24 hours: Vitals:   09/06/20 1554 09/06/20 1842 09/06/20 2142 09/07/20 0614  BP: (!) 97/55 104/67 114/63 115/63  Pulse: 68  68 (!) 59  Resp: 18  18   Temp: 98.4 F (36.9 C)  98.1 F (36.7 C) 97.7 F (36.5 C)  TempSrc: Oral  Oral Oral  SpO2: 99%  100% 100%  Weight:      Height:       Supplemental O2: Room Air SpO2: 100 % O2 Flow Rate (L/min): 2 L/min   Physical Exam:  Constitutional: Ill-appearing female HENT: normocephalic atraumatic, Eyes: conjunctiva non-erythematous Neck: supple Cardiovascular: Regular rate and rhythm.  Pulmonary/Chest: No acute respiratory distress. MSK: normal bulk and tone Neurological: Alert and oriented x 3 Skin: Patient has warm extremities. 1+-2+ pitting edema bilaterally in her lower extremities.  Patient with circular ecchymoses of bilateral upper extremities.  Did not appreciate any petechiae.  Filed Weights   08/31/20 1042  Weight: 84.8 kg     Intake/Output Summary (Last 24 hours) at 09/07/2020 0726 Last data filed at 09/06/2020 1900 Gross per 24 hour  Intake 480 ml  Output 700 ml  Net -220 ml   Net IO Since Admission: 196.09 mL [09/07/20 0726]  No results for input(s): GLUCAP in the last 72 hours.   Pertinent Labs: CBC Latest Ref Rng & Units 09/06/2020 09/06/2020 09/05/2020  WBC 4.0 - 10.5 K/uL 3.2(L) 2.7(L) -  Hemoglobin 12.0 - 15.0 g/dL 8.1(L) 7.5(L) -  Hematocrit 36.0 - 46.0 % 26.3(L) 25.1(L) -  Platelets 150 - 400 K/uL 98(L) 107(L) 110(L)    CMP Latest Ref Rng & Units 09/07/2020 09/06/2020 09/05/2020   Glucose 70 - 99 mg/dL - 101(H) 113(H)  BUN 6 - 20 mg/dL - 7 8  Creatinine 0.44 - 1.00 mg/dL 0.49 0.42(L) 0.43(L)  Sodium 135 - 145 mmol/L - 134(L) 135  Potassium 3.5 - 5.1 mmol/L - 4.4 4.0  Chloride 98 - 111 mmol/L - 104 106  CO2 22 - 32 mmol/L - 23 22  Calcium 8.9 - 10.3 mg/dL - 9.1 9.6  Total Protein 6.5 - 8.1 g/dL - 6.7 6.8  Total Bilirubin 0.3 - 1.2 mg/dL - 1.0 0.9  Alkaline Phos 38 - 126 U/L - 135(H) 126  AST 15 - 41 U/L - 49(H) 62(H)  ALT 0 - 44 U/L - 23 26    Imaging: No results found.  Assessment/Plan:   Active Problems:   Acute encephalopathy   Sepsis ([place])   Atrial fibrillation with RVR (HCC)   Ulcers of both lower extremities (HCC)   Thyrotoxicosis   Wound infection   Other pancytopenia ([place])   Patient Summary: Candace Robinson is a 55 y.o. with a pertinent PMH of CHF, A. fib, hypertension, type 2 diabetes, venous insufficiency with lower extremity ulcers, who presented with altered mental status and lower extremity wounds and admitted for severe thyrotoxicosis.   #Thyrotoxicosis, severe #Pancytopenia: Patient is asymptomatic from a thyrotoxicosis standpoint.  She is tolerating methimazole well.  She denies shortness of breath and chest  pain.  Continued to be concerned about possible agranulocytosis.  We will continue to monitor the patient closely for pancytopenia.  Did have some circular ecchymosis, suspect secondary to thrombocytopenia.  It appears her white count hemoglobin and platelets are not decreasing.  Will consider discontinuing DVT prophylaxis if platelets continue to drop.  Continue propranolol at this time, consider continuing on outpatient basis and discontinuing dilt.  It appears patient was started on dual secondary to her A. fib which I suspect is from her hyperthyroidism. -Continue methimazole daily -Continue propranolol, consider permanent switch -Daily CBC -If agranulocytosis is a concern then we will switch back to PTU and consult general  surgery for possible thyroidectomy.  Hypercalcemia: Stable today -Status post pamidronate 60 mg and calcitonin -Continue daily CMP's  Lower extremity ulcers, cellulitis: Continues to have lower extremity wounds with significant pain.   -Continue doxycycline day 8 and tolerating well  -Pain management will likely need further evaluation of her pain medications with breakthrough during wound care.   -Continue to trend blood cultures and urine cultures has no growth so far.  Normocytic Anemia: Likely iron deficiency anemia.  - We will supplement iron once patient stabilizes and completes course of antibiotics. - monitor closely.   Diet: Regular diet, thin IVF: None,None VTE: Enoxaparin Code: Full PT/OT recs: Pending  TOC recs: None Family Update: Update family tomorrow  Dispo: Anticipated discharge in 3 days pending further work-up and SNF placement.   Sanjuana Letters DO  Internal Medicine Resident PGY-1 [place]  Pager: [phone]    Please contact the on call pager after 5 pm and on weekends at [phone].

## 2020-09-07 NOTE — Plan of Care (Signed)
  Problem: Activity: Goal: Risk for activity intolerance will decrease Outcome: Progressing   Problem: Coping: Goal: Level of anxiety will decrease Outcome: Progressing   Problem: Pain Managment: Goal: General experience of comfort will improve Outcome: Progressing   Problem: Safety: Goal: Ability to remain free from injury will improve Outcome: Progressing   Problem: Skin Integrity: Goal: Risk for impaired skin integrity will decrease Outcome: Progressing   

## 2020-09-08 LAB — COMPREHENSIVE METABOLIC PANEL
ALT: 22 U/L (ref 0–44)
AST: 48 U/L — ABNORMAL HIGH (ref 15–41)
Albumin: 2 g/dL — ABNORMAL LOW (ref 3.5–5.0)
Alkaline Phosphatase: 133 U/L — ABNORMAL HIGH (ref 38–126)
Anion gap: 6 (ref 5–15)
BUN: 7 mg/dL (ref 6–20)
CO2: 19 mmol/L — ABNORMAL LOW (ref 22–32)
Calcium: 8 mg/dL — ABNORMAL LOW (ref 8.9–10.3)
Chloride: 107 mmol/L (ref 98–111)
Creatinine, Ser: 0.44 mg/dL (ref 0.44–1.00)
GFR, Estimated: >60 mL/min (ref >60)
Glucose, Bld: 94 mg/dL (ref 70–99)
Potassium: 4.5 mmol/L (ref 3.5–5.1)
Sodium: 132 mmol/L — ABNORMAL LOW (ref 135–145)
Total Bilirubin: 0.6 mg/dL (ref 0.3–1.2)
Total Protein: 6.4 g/dL — ABNORMAL LOW (ref 6.5–8.1)

## 2020-09-08 LAB — CBC WITH DIFFERENTIAL/PLATELET
Abs Immature Granulocytes: 0.01 10*3/uL (ref 0.00–0.07)
Basophils Absolute: 0 10*3/uL (ref 0.0–0.1)
Basophils Relative: 0 %
Eosinophils Absolute: 0.2 10*3/uL (ref 0.0–0.5)
Eosinophils Relative: 4 %
HCT: 25.5 % — ABNORMAL LOW (ref 36.0–46.0)
Hemoglobin: 7.6 g/dL — ABNORMAL LOW (ref 12.0–15.0)
Immature Granulocytes: 0 %
Lymphocytes Relative: 39 %
Lymphs Abs: 1.5 10*3/uL (ref 0.7–4.0)
MCH: 24.6 pg — ABNORMAL LOW (ref 26.0–34.0)
MCHC: 29.8 g/dL — ABNORMAL LOW (ref 30.0–36.0)
MCV: 82.5 fL (ref 80.0–100.0)
Monocytes Absolute: 0.5 10*3/uL (ref 0.1–1.0)
Monocytes Relative: 14 %
Neutro Abs: 1.6 10*3/uL — ABNORMAL LOW (ref 1.7–7.7)
Neutrophils Relative %: 43 %
Platelets: 101 10*3/uL — ABNORMAL LOW (ref 150–400)
RBC: 3.09 MIL/uL — ABNORMAL LOW (ref 3.87–5.11)
RDW: 17.1 % — ABNORMAL HIGH (ref 11.5–15.5)
WBC: 3.8 10*3/uL — ABNORMAL LOW (ref 4.0–10.5)
nRBC: 0 % (ref 0.0–0.2)

## 2020-09-08 MED ORDER — CALCIUM POLYCARBOPHIL 625 MG PO TABS
625.0000 mg | ORAL_TABLET | Freq: Every day | ORAL | Status: DC
  Administered 2020-09-08 – 2020-09-17 (×10): 625 mg via ORAL
  Filled 2020-09-08 (×12): qty 1

## 2020-09-08 MED ORDER — METHIMAZOLE 10 MG PO TABS
30.0000 mg | ORAL_TABLET | Freq: Every day | ORAL | Status: DC
  Administered 2020-09-09 – 2020-09-17 (×9): 30 mg via ORAL
  Filled 2020-09-08 (×10): qty 3

## 2020-09-08 MED ORDER — LOPERAMIDE HCL 2 MG PO CAPS
2.0000 mg | ORAL_CAPSULE | ORAL | Status: DC | PRN
  Administered 2020-09-08: 2 mg via ORAL
  Filled 2020-09-08: qty 1

## 2020-09-08 NOTE — Progress Notes (Addendum)
[facility] econsulted, appreciate their assistance.  Recommendations as per below  -Reduce methimazole to 30 mg daily -Weekly CBCs, if numbers continue to follow decrease methimazole to 20 mg daily -Stop methimazole if absolute neutrophil count drops below 500 (consider if drops below 1000) -Check thyroid function tests in a month

## 2020-09-08 NOTE — Plan of Care (Signed)
  Problem: Activity: Goal: Risk for activity intolerance will decrease Outcome: Progressing   Problem: Coping: Goal: Level of anxiety will decrease Outcome: Progressing   Problem: Pain Managment: Goal: General experience of comfort will improve Outcome: Progressing   Problem: Safety: Goal: Ability to remain free from injury will improve Outcome: Progressing   Problem: Skin Integrity: Goal: Risk for impaired skin integrity will decrease Outcome: Progressing   

## 2020-09-08 NOTE — Progress Notes (Addendum)
HD#8 Subjective:  Overnight Events: Diarrhea  Patient resting in bed comfortably, states she feels well this morning.  Does endorse some diarrhea overnight.  Her pain is currently mild with tylenol. She says she feels at her baseline in terms of mental status and awareness. She says her bruising has not changed since yesterday. Denies any new rashes.  Denies any sores in her mouth or having a sore throat.  Objective:  Vital signs in last 24 hours: Vitals:   09/07/20 2138 09/07/20 2142 09/08/20 0500 09/08/20 0902  BP: 124/76 118/64 (!) 102/56 115/71  Pulse: 66 66 64 64  Resp: 17 17 16 17   Temp: 97.9 F (36.6 C) 97.8 F (36.6 C) 98.4 F (36.9 C) 99 F (37.2 C)  TempSrc: Oral Oral Oral Oral  SpO2:   98% 100%  Weight:      Height:       Supplemental O2: Room Air SpO2: 100 % O2 Flow Rate (L/min): 2 L/min   Physical Exam:  Constitutional: Well-appearing female HENT: normocephalic atraumatic, throat/mouth clear of any lesions Eyes: conjunctiva non-erythematous Neck: supple Cardiovascular: Regular rate and rhythm.  Pulmonary/Chest: No acute respiratory distress. MSK: normal bulk and tone Neurological: Alert and oriented x 3 Skin: Patient has warm extremities. 1+-2+ pitting edema bilaterally in her lower extremities.  Foot wounds wrapped.  Patient with circular ecchymoses of bilateral upper extremities.  Did not appreciate any petechiae.  Filed Weights   08/31/20 1042  Weight: 84.8 kg     Intake/Output Summary (Last 24 hours) at 09/08/2020 1058 Last data filed at 09/07/2020 1500 Gross per 24 hour  Intake 240 ml  Output --  Net 240 ml   Net IO Since Admission: 76.09 mL [09/08/20 1058]  No results for input(s): GLUCAP in the last 72 hours.   Pertinent Labs: CBC Latest Ref Rng & Units 09/08/2020 09/07/2020 09/06/2020  WBC 4.0 - 10.5 K/uL 3.8(L) 3.9(L) 3.2(L)  Hemoglobin 12.0 - 15.0 g/dL 7.6(L) 7.9(L) 8.1(L)  Hematocrit 36.0 - 46.0 % 25.5(L) 25.0(L) 26.3(L)  Platelets 150  - 400 K/uL 101(L) 118(L) 98(L)    CMP Latest Ref Rng & Units 09/08/2020 09/07/2020 09/07/2020  Glucose 70 - 99 mg/dL 94 95 -  BUN 6 - 20 mg/dL 7 8 -  Creatinine 0.44 - 1.00 mg/dL 0.44 0.48 0.49  Sodium 135 - 145 mmol/L 132(L) 134(L) -  Potassium 3.5 - 5.1 mmol/L 4.5 4.1 -  Chloride 98 - 111 mmol/L 107 106 -  CO2 22 - 32 mmol/L 19(L) 20(L) -  Calcium 8.9 - 10.3 mg/dL 8.0(L) 8.5(L) -  Total Protein 6.5 - 8.1 g/dL 6.4(L) 7.0 -  Total Bilirubin 0.3 - 1.2 mg/dL 0.6 0.7 -  Alkaline Phos 38 - 126 U/L 133(H) 134(H) -  AST 15 - 41 U/L 48(H) 45(H) -  ALT 0 - 44 U/L 22 23 -    Imaging: No results found.  Assessment/Plan:   Active Problems:   Acute encephalopathy   Atrial fibrillation with RVR (HCC)   Ulcers of both lower extremities (HCC)   Thyrotoxicosis   Wound infection   Other pancytopenia ([place])   Patient Summary: Candace Robinson is a 55 y.o. with a pertinent PMH of CHF, A. fib, hypertension, type 2 diabetes, venous insufficiency with lower extremity ulcers, who presented with altered mental status and lower extremity wounds and admitted for severe thyrotoxicosis.   #Thyrotoxicosis, severe #Pancytopenia: Asymptomatic and hemodynamically stable.  Does endorse episodes of diarrhea, suspect 2/2 thyroid disease. WBC, Hgb, and  platelets low but remaining stable.  We will consult endocrinology today for further assistance, patient at high risk for transition into full agranulocytosis.  -Consult endocrinology -Continue methimazole daily -Continue propranolol -Daily CBC -If agranulocytosis is a concern then we will switch back to PTU and consult general surgery for possible thyroidectomy. -Patient will need repeat thyroid studies in 3 to 4 weeks. -Discontinued Senokot ordered as PRN, added fiber and Imodium as needed.  Hypercalcemia: Stable today, corrected calcium of 9.6. -Status post pamidronate 60 mg and calcitonin -Continue daily CMP's  Lower extremity ulcers,  cellulitis: Continues to have lower extremity wounds with significant pain.  Thought to be source of patient symptoms and sepsis was considered, but was ruled out. -Continue doxycycline day 9 and tolerating well  -End date of ABX 03/14 -Continue pain meds with wound care management -Blood cultures/urine cultures negative  Normocytic Anemia: Likely iron deficiency anemia.  - We will supplement iron once patient stabilizes and completes course of antibiotics. - monitor closely.   Diet: Regular diet, thin IVF: None,None VTE: Enoxaparin Code: Full PT/OT recs: SNF TOC recs: None Family Update: Plan to speak with sister today  Dispo: Anticipated discharge in 3-4 days pending further work-up and SNF placement.  Working with social work to obtain [employer].  Sanjuana Letters DO  Internal Medicine Resident PGY-1 [place]  Pager: [phone]  Please contact the on call pager after 5 pm and on weekends at [phone].

## 2020-09-08 NOTE — Progress Notes (Signed)
Occupational Therapy Treatment Patient Details Name: Candace Robinson MRN: [id number] DOB: [birth year] Today's Date: 09/08/2020    History of present illness The pt is a 55 yo female who presenting 2/28 due to concerns of BLE ulcers and AMS. 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 PMH includes: CHF, afib, HTN, DM II, lupus, heart mumur, and chronic venous insufficiency.   OT comments  Pt making steady progress towards OT goals this session. Session focus on functional mobility as precursor to higher level BADLs. Pt continues to present with increased pain in BLEs unable to WB at this time. Pt able to roll R<>L to place lift pad with min guard assist, total A for transfer to recliner via maximove with pt tolerating transfer well. Pt set- up in chair to eat lunch. Pt would continue to benefit from skilled occupational therapy while admitted and after d/c to address the below listed limitations in order to improve overall functional mobility and facilitate independence with BADL participation. DC plan remains appropriate, will follow acutely per POC.     Follow Up Recommendations  SNF    Equipment Recommendations  None recommended by OT    Recommendations for Other Services      Precautions / Restrictions Precautions Precautions: Fall;Other (comment) Precaution Comments: wounds to BLE Restrictions Weight Bearing Restrictions: No       Mobility Bed Mobility Overal bed mobility: Needs Assistance Bed Mobility: Rolling Rolling: Min guard         General bed mobility comments: min guard to roll R<>L to place lift pad, tactile cues at times for body mechanics for rolling.    Transfers Overall transfer level: Needs assistance Equipment used: Ambulation equipment used             General transfer comment: total A lift to recliner with maximove    Balance                                           ADL either performed or assessed with clinical  judgement   ADL Overall ADL's : Needs assistance/impaired Eating/Feeding: Set up;Sitting   Grooming: Wash/dry face;Set up;Sitting                     Toilet Transfer Details (indicate cue type and reason): total A via maximove for lift to recliner         Functional mobility during ADLs: Min guard (rolling R<>L in bed; total A for maximove transfer OOB) General ADL Comments: pt continuing to present with increased pain in BLEs, total A for maximove lift OOB, pt rolling R<>L with min guard to place lift, MIN A for cues realted to body mechanics for bed mobility     Vision       Perception     Praxis      Cognition Arousal/Alertness: Awake/alert Behavior During Therapy: WFL for tasks assessed/performed Overall Cognitive Status: Impaired/Different from baseline Area of Impairment: Following commands;Problem solving                       Following Commands: Follows one step commands with increased time;Follows multi-step commands inconsistently     Problem Solving: Slow processing;Requires verbal cues;Requires tactile cues;Difficulty sequencing General Comments: pt requried increased time to follow commands and needed tactile cues at times to sequence bed mobility tasks  Exercises     Shoulder Instructions       General Comments bilateral feet with dressing applied, appearing dry    Pertinent Vitals/ Pain       Pain Assessment: Faces Faces Pain Scale: Hurts little more Pain Location: BLE Pain Descriptors / Indicators: Grimacing;Discomfort;Crying Pain Intervention(s): Limited activity within patient's tolerance;Monitored during session;Repositioned  Home Living                                          Prior Functioning/Environment              Frequency  Min 2X/week        Progress Toward Goals  OT Goals(current goals can now be found in the care plan section)  Progress towards OT goals: Progressing toward  goals  Acute Rehab OT Goals Patient Stated Goal: to get to chair OT Goal Formulation: With patient Time For Goal Achievement: 09/17/20 Potential to Achieve Goals: Good  Plan Discharge plan remains appropriate;Frequency remains appropriate    Co-evaluation                 AM-PAC OT "6 Clicks" Daily Activity     Outcome Measure   Help from another person eating meals?: A Little (s/u) Help from another person taking care of personal grooming?: A Little Help from another person toileting, which includes using toliet, bedpan, or urinal?: Total Help from another person bathing (including washing, rinsing, drying)?: A Lot Help from another person to put on and taking off regular upper body clothing?: A Little Help from another person to put on and taking off regular lower body clothing?: Total 6 Click Score: 13    End of Session Equipment Utilized During Treatment: Other (comment) (maximove)  OT Visit Diagnosis: Unsteadiness on feet (R26.81);Other abnormalities of gait and mobility (R26.89);Muscle weakness (generalized) (M62.81);Pain Pain - Right/Left:  (bilateral) Pain - part of body: Leg   Activity Tolerance Patient tolerated treatment well   Patient Left in chair;with call bell/phone within reach;with chair alarm set   Nurse Communication Mobility status;Need for lift equipment;Other (comment) (maximove back to bed)        Time: 5956-3875 OT Time Calculation (min): 30 min  Charges: OT General Charges $OT Visit: 1 Visit OT Treatments $Therapeutic Activity: 23-37 mins  Harley Alto., COTA/L Acute Rehabilitation Services [place] 09/08/2020, 4:25 PM

## 2020-09-08 NOTE — Progress Notes (Signed)
Paged by RN that patient had episode of headache, abdominal pain, as well as vomiting episode.  Once at bedside patient states that her headache has improved.  She describes the pain as a pressure that was on the left side of her head that occurred at the exact same time that the left-sided upper abdominal pain occurred.  She states that she was lying in bed when it all began.  She then vomited, describing the vomit as liquid.  Denies any blood in the vomit.  On my exam she is having no tenderness of the left upper quadrant on palpation.  Patient upper and lower extremity intact.  Able to ambulate without difficulty since the episode.  We will continue to follow and monitor.

## 2020-09-09 LAB — CBC WITH DIFFERENTIAL/PLATELET
Abs Immature Granulocytes: 0.02 10*3/uL (ref 0.00–0.07)
Basophils Absolute: 0 10*3/uL (ref 0.0–0.1)
Basophils Relative: 0 %
Eosinophils Absolute: 0.1 10*3/uL (ref 0.0–0.5)
Eosinophils Relative: 3 %
HCT: 24.5 % — ABNORMAL LOW (ref 36.0–46.0)
Hemoglobin: 7.5 g/dL — ABNORMAL LOW (ref 12.0–15.0)
Immature Granulocytes: 1 %
Lymphocytes Relative: 47 %
Lymphs Abs: 2.1 10*3/uL (ref 0.7–4.0)
MCH: 24.4 pg — ABNORMAL LOW (ref 26.0–34.0)
MCHC: 30.6 g/dL (ref 30.0–36.0)
MCV: 79.8 fL — ABNORMAL LOW (ref 80.0–100.0)
Monocytes Absolute: 0.5 10*3/uL (ref 0.1–1.0)
Monocytes Relative: 11 %
Neutro Abs: 1.6 10*3/uL — ABNORMAL LOW (ref 1.7–7.7)
Neutrophils Relative %: 38 %
Platelets: 159 10*3/uL (ref 150–400)
RBC: 3.07 MIL/uL — ABNORMAL LOW (ref 3.87–5.11)
RDW: 16.9 % — ABNORMAL HIGH (ref 11.5–15.5)
WBC: 4.3 10*3/uL (ref 4.0–10.5)
nRBC: 0 % (ref 0.0–0.2)

## 2020-09-09 LAB — BASIC METABOLIC PANEL
Anion gap: 6 (ref 5–15)
BUN: 7 mg/dL (ref 6–20)
CO2: 21 mmol/L — ABNORMAL LOW (ref 22–32)
Calcium: 7.8 mg/dL — ABNORMAL LOW (ref 8.9–10.3)
Chloride: 105 mmol/L (ref 98–111)
Creatinine, Ser: 0.45 mg/dL (ref 0.44–1.00)
GFR, Estimated: >60 mL/min (ref >60)
Glucose, Bld: 92 mg/dL (ref 70–99)
Potassium: 4 mmol/L (ref 3.5–5.1)
Sodium: 132 mmol/L — ABNORMAL LOW (ref 135–145)

## 2020-09-09 MED ORDER — PROPRANOLOL HCL 40 MG PO TABS
40.0000 mg | ORAL_TABLET | Freq: Three times a day (TID) | ORAL | Status: DC
  Administered 2020-09-09 (×2): 40 mg via ORAL
  Filled 2020-09-09 (×3): qty 1

## 2020-09-09 NOTE — Progress Notes (Signed)
HD#9 Subjective:  Overnight Events: Episode of headache/abdominal pain/vomiting  Candace Robinson states that she is starting to feel like her usual self. She denies any recurrent episodes of headache, abdominal pain or vomiting. She has been using lotion on her skin rashes which has helped.  Bilateral lower extremity pain has improved.  Denies any acute acute concern at this time.  Objective:  Vital signs in last 24 hours: Vitals:   09/08/20 1139 09/08/20 1416 09/08/20 1943 09/09/20 0538  BP: 138/76 106/61 107/64 111/72  Pulse: 72 68 67 66  Resp:  17 (!) 22 14  Temp: 98.5 F (36.9 C) 98.7 F (37.1 C) 98.6 F (37 C) 98.2 F (36.8 C)  TempSrc: Oral Oral Oral Oral  SpO2: 100% 100% 100% 100%  Weight:      Height:       Supplemental O2: Room Air SpO2: 100 % O2 Flow Rate (L/min): 2 L/min   Physical Exam:  Constitutional: Well-appearing female HENT: normocephalic atraumatic Eyes: conjunctiva non-erythematous Neck: supple Cardiovascular: Regular rate and rhythm.  Systolic murmur, 2/6 best appreciated over right second intercostal space. Pulmonary/Chest: No acute respiratory distress. MSK: normal bulk and tone Neurological: Alert and oriented x 3 Skin: Patient has warm extremities. 1+-2+ pitting edema bilaterally in her lower extremities.  Foot wounds wrapped.  Patient with circular ecchymoses of bilateral upper extremities, not worsening.  Did not appreciate any petechiae.  Filed Weights   08/31/20 1042  Weight: 84.8 kg    No intake or output data in the 24 hours ending 09/09/20 0743 Net IO Since Admission: 76.09 mL [09/09/20 0743]  No results for input(s): GLUCAP in the last 72 hours.   Pertinent Labs: CBC Latest Ref Rng & Units 09/09/2020 09/08/2020 09/07/2020  WBC 4.0 - 10.5 K/uL 4.3 3.8(L) 3.9(L)  Hemoglobin 12.0 - 15.0 g/dL 7.5(L) 7.6(L) 7.9(L)  Hematocrit 36.0 - 46.0 % 24.5(L) 25.5(L) 25.0(L)  Platelets 150 - 400 K/uL 159 101(L) 118(L)    CMP Latest Ref Rng & Units  09/09/2020 09/08/2020 09/07/2020  Glucose 70 - 99 mg/dL 92 94 95  BUN 6 - 20 mg/dL 7 7 8   Creatinine 0.44 - 1.00 mg/dL 0.45 0.44 0.48  Sodium 135 - 145 mmol/L 132(L) 132(L) 134(L)  Potassium 3.5 - 5.1 mmol/L 4.0 4.5 4.1  Chloride 98 - 111 mmol/L 105 107 106  CO2 22 - 32 mmol/L 21(L) 19(L) 20(L)  Calcium 8.9 - 10.3 mg/dL 7.8(L) 8.0(L) 8.5(L)  Total Protein 6.5 - 8.1 g/dL - 6.4(L) 7.0  Total Bilirubin 0.3 - 1.2 mg/dL - 0.6 0.7  Alkaline Phos 38 - 126 U/L - 133(H) 134(H)  AST 15 - 41 U/L - 48(H) 45(H)  ALT 0 - 44 U/L - 22 23    Imaging: No results found.  Assessment/Plan:   Active Problems:   Acute encephalopathy   Atrial fibrillation with RVR (HCC)   Ulcers of both lower extremities (HCC)   Thyrotoxicosis   Wound infection   Other pancytopenia ([place])   Patient Summary: Candace Robinson is a 55 y.o. with a pertinent PMH of CHF, A. fib, hypertension, type 2 diabetes, venous insufficiency with lower extremity ulcers, who presented with altered mental status and lower extremity wounds and admitted for severe thyrotoxicosis.   #Thyrotoxicosis, severe #Pancytopenia: Asymptomatic and hemodynamically stable.  E-consulted endocrinology, recommendations as per prior note. -Decrease methimazole to 30 mg daily -Decrease propranolol to 3 times daily -Daily CBC, once discharged recommend repeating on a weekly basis until able to follow-up with  repeat thyroid function tests. -If agranulocytosis is a concern then we will switch back to PTU and consult general surgery for possible thyroidectomy.  Lower extremity ulcers, cellulitis: Continues to have lower extremity wounds with significant pain.  Thought to be source of patient symptoms and sepsis was considered, but was ruled out. -Continue doxycycline day 10 and tolerating well  -End date of ABX 03/14 -Continue pain meds with wound care management -Blood cultures/urine cultures negative  Normocytic Anemia: Likely iron deficiency anemia.  -  We will supplement iron once patient stabilizes and completes course of antibiotics. - monitor closely.   Diet: Regular diet, thin IVF: None,None VTE: Enoxaparin Code: Full PT/OT recs: SNF TOC recs: None Family Update: Patient sister, Candace Robinson, updated yesterday.  Dispo: Anticipated discharge in 3-4 days pending SNF placement and approval of Medicaid.  We will continue to work with patient, sister, Education officer, museum to determine discharge plans as well as post SNF living arrangements.  Sanjuana Letters DO  Internal Medicine Resident PGY-1 [place]  Pager: [phone]  Please contact the on call pager after 5 pm and on weekends at [phone].

## 2020-09-09 NOTE — Progress Notes (Signed)
Pt refused BLE dressing to be changed. She states that she is in a lot of pain and would like to wait for the night shift nurse to do It

## 2020-09-09 NOTE — Plan of Care (Signed)
BLE dressing were changed, still weeping with purulent and serosanguineous drainage. Pt also had a large bowel movement this morning.   Problem: Education: Goal: Knowledge of General Education information will improve Description: Including pain rating scale, medication(s)/side effects and non-pharmacologic comfort measures Outcome: Progressing   Problem: Health Behavior/Discharge Planning: Goal: Ability to manage health-related needs will improve Outcome: Progressing   Problem: Activity: Goal: Risk for activity intolerance will decrease Outcome: Progressing   Problem: Coping: Goal: Level of anxiety will decrease Outcome: Progressing   Problem: Pain Managment: Goal: General experience of comfort will improve Outcome: Progressing   Problem: Safety: Goal: Ability to remain free from injury will improve Outcome: Progressing

## 2020-09-09 NOTE — Plan of Care (Signed)
  Problem: Health Behavior/Discharge Planning: Goal: Ability to manage health-related needs will improve Outcome: Progressing   Problem: Clinical Measurements: Goal: Ability to maintain clinical measurements within normal limits will improve Outcome: Progressing Goal: Will remain free from infection Outcome: Progressing   

## 2020-09-10 DIAGNOSIS — E871 Hypo-osmolality and hyponatremia: Secondary | ICD-10-CM

## 2020-09-10 LAB — CBC
HCT: 23.8 % — ABNORMAL LOW (ref 36.0–46.0)
Hemoglobin: 7.5 g/dL — ABNORMAL LOW (ref 12.0–15.0)
MCH: 24.8 pg — ABNORMAL LOW (ref 26.0–34.0)
MCHC: 31.5 g/dL (ref 30.0–36.0)
MCV: 78.8 fL — ABNORMAL LOW (ref 80.0–100.0)
Platelets: 154 10*3/uL (ref 150–400)
RBC: 3.02 MIL/uL — ABNORMAL LOW (ref 3.87–5.11)
RDW: 17.1 % — ABNORMAL HIGH (ref 11.5–15.5)
WBC: 4.5 10*3/uL (ref 4.0–10.5)
nRBC: 0 % (ref 0.0–0.2)

## 2020-09-10 LAB — COMPREHENSIVE METABOLIC PANEL
ALT: 33 U/L (ref 0–44)
AST: 52 U/L — ABNORMAL HIGH (ref 15–41)
Albumin: 2.1 g/dL — ABNORMAL LOW (ref 3.5–5.0)
Alkaline Phosphatase: 146 U/L — ABNORMAL HIGH (ref 38–126)
Anion gap: 4 — ABNORMAL LOW (ref 5–15)
BUN: <5 mg/dL — ABNORMAL LOW (ref 6–20)
CO2: 24 mmol/L (ref 22–32)
Calcium: 7.5 mg/dL — ABNORMAL LOW (ref 8.9–10.3)
Chloride: 107 mmol/L (ref 98–111)
Creatinine, Ser: 0.54 mg/dL (ref 0.44–1.00)
GFR, Estimated: >60 mL/min (ref >60)
Glucose, Bld: 95 mg/dL (ref 70–99)
Potassium: 3.8 mmol/L (ref 3.5–5.1)
Sodium: 135 mmol/L (ref 135–145)
Total Bilirubin: 0.8 mg/dL (ref 0.3–1.2)
Total Protein: 6.6 g/dL (ref 6.5–8.1)

## 2020-09-10 MED ORDER — PROPRANOLOL HCL 40 MG PO TABS
40.0000 mg | ORAL_TABLET | Freq: Two times a day (BID) | ORAL | Status: DC
  Administered 2020-09-11 – 2020-09-17 (×11): 40 mg via ORAL
  Filled 2020-09-10 (×16): qty 1

## 2020-09-10 NOTE — Plan of Care (Signed)
  Problem: Activity: Goal: Risk for activity intolerance will decrease Outcome: Progressing   Problem: Pain Managment: Goal: General experience of comfort will improve Outcome: Progressing   

## 2020-09-10 NOTE — Progress Notes (Signed)
Physical Therapy Treatment Patient Details Name: Candace Robinson MRN: [id number] DOB: Feb 04, 1966 Today's Date: 09/10/2020    History of Present Illness The pt is a 55 yo female who presenting 2/28 due to concerns of BLE ulcers and AMS. 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 PMH includes: CHF, afib, HTN, DM II, lupus, heart mumur, and chronic venous insufficiency.    PT Comments    Pt did well with AP transfer this session. Pt's LEs were kept in an independent position to reduce pain during transfer. Max cues required, as pt does continue to have some deficits in processing. Once in recliner chair pt performed HEP and reported she could tolerate a dependent position while performing LAQ, but once she allowed her legs to rest they quickly became painful after ~30 seconds and leg were again elevated. Will continue to follow acutely for mobility progression.    Follow Up Recommendations  SNF;Supervision/Assistance - 24 hour     Equipment Recommendations  Wheelchair (measurements PT);Wheelchair cushion (measurements PT)    Recommendations for Other Services       Precautions / Restrictions Precautions Precautions: Fall;Other (comment) Precaution Comments: wounds to BLE    Mobility  Bed Mobility Overal bed mobility: Needs Assistance Bed Mobility: Supine to Sit     Supine to sit: Supervision     General bed mobility comments: Pt able to progress from supine to long sitting w/o assist.    Transfers Overall transfer level: Needs assistance   Transfers: Anterior-Posterior Transfer       Anterior-Posterior transfers: Mod assist;+2 physical assistance;+2 safety/equipment   General transfer comment: Max cues required for transfer. Pt uncoordinated with scooting hips backward and needed step by step instructions to [facility] backwards. Mod A +2 to progress hips. Pt able to maintain upright trunk w/o assist.  Ambulation/Gait                 Stairs              Wheelchair Mobility    Modified Rankin (Stroke Patients Only)       Balance Overall balance assessment: Needs assistance Sitting-balance support: Feet supported;Bilateral upper extremity supported Sitting balance-Leahy Scale: Good Sitting balance - Comments: pt able to maintain upright trunk during AP transfer.                                    Cognition Arousal/Alertness: Awake/alert Behavior During Therapy: WFL for tasks assessed/performed Overall Cognitive Status: Impaired/Different from baseline Area of Impairment: Following commands;Problem solving                     Memory: Decreased short-term memory Following Commands: Follows one step commands with increased time;Follows multi-step commands inconsistently;Follows multi-step commands with increased time     Problem Solving: Slow processing;Requires verbal cues;Requires tactile cues;Difficulty sequencing;Decreased initiation General Comments: Max VC provided for AP transfer and pt struggling to process how she would get back into bed using the same technique, dispite talking it through. Slow processing.      Exercises General Exercises - Lower Extremity Ankle Circles/Pumps: AROM;Both;10 reps;Seated Long Arc Quad: AROM;Both;10 reps;Seated Heel Slides: AROM;Both;10 reps;Seated Hip ABduction/ADduction: AROM;Both;10 reps;Seated    General Comments General comments (skin integrity, edema, etc.): bilateral feet with dressing applied, appearing dry      Pertinent Vitals/Pain Pain Assessment: Faces Faces Pain Scale: Hurts a little bit Pain Location: BLE Pain Descriptors / Indicators: Grimacing;Discomfort  Pain Intervention(s): Monitored during session;Limited activity within patient's tolerance;Repositioned    Home Living                      Prior Function            PT Goals (current goals can now be found in the care plan section) Acute Rehab PT Goals Patient Stated  Goal: to get to chair PT Goal Formulation: With patient Time For Goal Achievement: 09/17/20 Potential to Achieve Goals: Good Progress towards PT goals: Progressing toward goals    Frequency    Min 2X/week      PT Plan Current plan remains appropriate    Co-evaluation              AM-PAC PT "6 Clicks" Mobility   Outcome Measure  Help needed turning from your back to your side while in a flat bed without using bedrails?: Total Help needed moving from lying on your back to sitting on the side of a flat bed without using bedrails?: Total Help needed moving to and from a bed to a chair (including a wheelchair)?: Total Help needed standing up from a chair using your arms (e.g., wheelchair or bedside chair)?: Total Help needed to walk in hospital room?: Total Help needed climbing 3-5 steps with a railing? : Total 6 Click Score: 6    End of Session   Activity Tolerance: Patient tolerated treatment well Patient left: with call bell/phone within reach;in chair Nurse Communication: Mobility status;Need for lift equipment (AP transfer or lift) PT Visit Diagnosis: Other abnormalities of gait and mobility (R26.89);Muscle weakness (generalized) (M62.81)     Time: 1694-5038 PT Time Calculation (min) (ACUTE ONLY): 25 min  Charges:  $Therapeutic Exercise: 8-22 mins $Therapeutic Activity: 8-22 mins                     Benjiman Core, [place] Pager [phone] Acute Rehab   Allena Katz 09/10/2020, 1:16 PM

## 2020-09-10 NOTE — Discharge Summary (Addendum)
Name: Candace Robinson MRN: [id number] DOB: 11-Mar-1966 55 y.o. PCP: Patient, No Pcp Per  Date of Admission: 08/31/2020 10:33 AM Date of Discharge: 09/17/20 Attending Physician: Dr. Dareen Piano  Discharge Diagnosis: Principal Problem:   Thyrotoxicosis Active Problems:   Acute encephalopathy   Atrial fibrillation with RVR (HCC)   Ulcers of both lower extremities (HCC)   Wound infection   Other pancytopenia ([place])    Discharge Medications: Allergies as of 09/17/2020      Reactions   Motrin [ibuprofen]    Unknown      Medication List    STOP taking these medications   diltiazem 120 MG 24 hr capsule Commonly known as: TIAZAC   traMADol 50 MG tablet Commonly known as: ULTRAM     TAKE these medications   acetaminophen 325 MG tablet Commonly known as: TYLENOL Take 2 tablets (650 mg total) by mouth every 6 (six) hours.   Aspirin Low Dose 81 MG EC tablet Generic drug: aspirin Take 81 mg by mouth daily.   ferrous sulfate 325 (65 FE) MG tablet Take 325 mg by mouth in the morning and at bedtime.   methimazole 10 MG tablet Commonly known as: TAPAZOLE Take 3 tablets (30 mg total) by mouth daily. Start taking on: September 18, 2020   oxyCODONE 5 MG immediate release tablet Commonly known as: Oxy IR/ROXICODONE Take 1 tablet (5 mg total) by mouth every 4 (four) hours as needed for moderate pain or severe pain. Use only prior to wound care changes   pantoprazole 40 MG tablet Commonly known as: PROTONIX Take 40 mg by mouth daily.   propranolol 40 MG tablet Commonly known as: INDERAL Take 1 tablet (40 mg total) by mouth 2 (two) times daily.   silver sulfADIAZINE 1 % cream Commonly known as: SILVADENE Apply topically 2 (two) times daily.   zinc gluconate 50 MG tablet Take 50 mg by mouth daily.   Zinc Oxide 12.8 % ointment Commonly known as: TRIPLE PASTE Apply topically 2 (two) times daily.            Discharge Care Instructions  (From admission, onward)          Start     Ordered   09/17/20 0000  Change dressing (specify)       Comments: Dressing procedure/placement/frequency:  MEDICATE PATIENT PATIENT PRIOR TO DRESSING CHANGES. MOISTEN OLD DRESSING PRIOR TO REMOVING.Apply to all wounds on Bilateral feet/lower legs. Cover with ABD pads, secure with kerlex.  Twice daily Triple Paste has been ordered for the thigh wounds.  Use zinc oxide and silver sulfadiazine as prescribed   09/17/20 1155          Disposition and follow-up:   [name] was discharged from [facility] in Stable condition.  At the hospital follow up visit please address:  1.  Follow-up:  A.  Hypothyroidism-follow-up with endocrinology, repeat thyroid hormone levels   B.  Lower extremity wounds-follow-up with wound care   C.  Anemia-repeat CBC in 2 to 3 weeks, continue iron supplements   D.  Pancytopenia-monitor CBCs monthly  2.  Labs / imaging needed at time of follow-up: TSH, free T4, free T3, CBC  3.  Pending labs/ test needing follow-up: None  4.  Medication Changes  Started: Methimazole, propranolol, oxycodone, Silvadene, triple paste  Stopped: Diltiazem  Changed: None  Abx -none  Follow-up Appointments:  Contact information for follow-up providers    [facility]. Call.   Specialty: Internal Medicine Contact information: [address], [place] [address] [phone]           Contact information for after-discharge care    [facility] .   Service: Skilled Nursing Contact information: [address] [place] Hospital Course by problem list:  Severe thyroid storm Severe thyrotoxicosis Patient presented to the [facility] emergency department with confusion for the past few days.  She had been diagnosed with thyrotoxicosis in November 2021, but was unable was waiting to get  the methimazole.  She was found to have A. fib in December 2021 and started on rate controlling medications.  No anticoagulation was started at this time.  It was thought that she was in severe thyroid storm.  She was started on IV PTU, propranolol, iodine.  She continues to do well on this and was eventually transitioned to p.o. methimazole and propranolol.  While on the methimazole initially for the first few days she was found to have a drop in her white blood cell count, platelets.  The methimazole dose was lowered to 30 mg and this improved.  This will need close monitoring and she will need repeat thyroid studies in 2 weeks.  At this time, methimazole levels can be adjusted adequately.  Also recommend if adjusted, patient has weekly CBCs to monitor for agranulocytosis.  Lower extremity cellulitis, ulcers Patient with lower extremity ulcers secondary to venous insufficiency.  These were closely monitored during hospitalization and she completed a 14-day course of doxycycline.  They will need to be changed daily by nursing/wound care staff.  She will also need triple paste as well as Silvadene applied to the areas.  Overall wounds were healing well during her hospitalization.  Normocytic anemia Patient with normocytic anemia, thought to be mixed picture of iron deficiency anemia as well as anemia of chronic disease.  She was started on IV iron supplementation during her hospitalization and continued on ferrous sulfate at discharge.  Discharge Subjective: On day of discharge patient doing well.  She has no acute concerns.  She denies chest pain fluttering in her chest.  She states she feels well overall.  Denies excessive pain with wound care changes, states that the pain medication helps.  Discharge Exam:   BP 137/81 (BP Location: Right Arm)   Pulse 60   Temp 98.4 F (36.9 C) (Oral)   Resp 15   Ht 5\' 3"  (1.6 m)   Wt 84.8 kg   SpO2 100%   BMI 33.13 kg/m  Constitutional: No acute distress,  resting in bed. HENT: normocephalic atraumatic Eyes: conjunctiva non-erythematous Neck: supple Cardiovascular: regular rate and rhythm, 2/6 systolic murmur  Pulmonary/Chest: normal work of breathing on room air Abdominal: non-distended MSK: normal bulk and tone Neurological: alert & oriented x 3 Skin: warm and dry, minimal lower extremity edema.  Wounds wrapped.   Psych: Normal mood  Pertinent Labs, Studies, and Procedures:  CBC Latest Ref Rng & Units 09/17/2020 09/16/2020 09/15/2020  WBC 4.0 - 10.5 K/uL 4.2 4.0 4.2  Hemoglobin 12.0 - 15.0 g/dL 7.8(L) 8.3(L) 7.8(L)  Hematocrit 36.0 - 46.0 % 25.0(L) 28.0(L) 26.1(L)  Platelets 150 - 400 K/uL 285 290 273    CMP Latest Ref Rng & Units 09/17/2020 09/16/2020 09/15/2020  Glucose 70 - 99 mg/dL 92 86 90  BUN 6 - 20 mg/dL 7 7 8  Creatinine 0.44 - 1.00 mg/dL 0.45 0.48 0.60  Sodium 135 - 145 mmol/L 133(L) 134(L) 134(L)  Potassium 3.5 - 5.1 mmol/L 4.2 4.4 4.3  Chloride 98 - 111 mmol/L 108 108 110  CO2 22 - 32 mmol/L 20(L) 20(L) 19(L)  Calcium 8.9 - 10.3 mg/dL 9.2 8.9 8.3(L)  Total Protein 6.5 - 8.1 g/dL - - -  Total Bilirubin 0.3 - 1.2 mg/dL - - -  Alkaline Phos 38 - 126 U/L - - -  AST 15 - 41 U/L - - -  ALT 0 - 44 U/L - - -    CT Head Wo Contrast  Result Date: 08/31/2020 CLINICAL DATA:  Mental status change EXAM: CT HEAD WITHOUT CONTRAST TECHNIQUE: Contiguous axial images were obtained from the base of the skull through the vertex without intravenous contrast. COMPARISON:  CT head 05/06/2020 FINDINGS: Brain: No evidence of acute infarction, hemorrhage, hydrocephalus, extra-axial collection or mass lesion/mass effect. Empty sella likely incidental and unchanged. Vascular: Negative for hyperdense vessel Skull: Negative Sinuses/Orbits: Paranasal sinuses clear.  Negative orbit Other: None IMPRESSION: Negative CT head Electronically Signed   By: Franchot Gallo M.D.   On: 08/31/2020 16:46   DG Chest Port 1 View  Result Date: 08/31/2020 CLINICAL  DATA:  Sepsis, history CHF, diabetes mellitus, hypertension, atrial fibrillation EXAM: PORTABLE CHEST 1 VIEW COMPARISON:  Portable exam 1404 hours compared to 04/26/2020 FINDINGS: Enlargement of cardiac silhouette. Mediastinal contours and pulmonary vascularity normal. Lungs clear. No pulmonary infiltrate, pleural effusion, or pneumothorax. Osseous structures unremarkable. Atherosclerotic calcifications aorta noted. IMPRESSION: Enlargement of cardiac silhouette. No acute infiltrate. Aortic Atherosclerosis (ICD10-I70.0). Electronically Signed   By: Lavonia Dana M.D.   On: 08/31/2020 14:24   DG Foot Complete Left  Result Date: 08/31/2020 CLINICAL DATA:  Bilateral foot ulcer EXAM: LEFT FOOT - COMPLETE 3+ VIEW COMPARISON:  MRI April 23, 2020 FINDINGS: Area of cortical erosive type change seen through the second and fourth distal phalanges. No osseous fracture seen. There is diffuse osteopenia. Dorsal soft tissue swelling is seen. Calcaneal enthesophytes are noted. Scattered soft tissue calcifications are seen. IMPRESSION: Findings which may be suggestive of osteomyelitis involving the second and fourth distal phalanges. No osseous fracture seen. Electronically Signed   By: Prudencio Pair M.D.   On: 08/31/2020 17:08   DG Foot Complete Right  Result Date: 08/31/2020 CLINICAL DATA:  Bilateral foot ulcer EXAM: RIGHT FOOT COMPLETE - 3+ VIEW COMPARISON:  None. FINDINGS: There is no evidence of fracture or dislocation. Healed fracture deformity of the fifth metatarsal is seen. No definite evidence of cortical destruction or periosteal reaction. 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 Dorsal subcutaneous edema is seen. IMPRESSION: No definite evidence of osteomyelitis.  Dorsal soft tissue swelling. Electronically Signed   By: Prudencio Pair M.D.   On: 08/31/2020 17:09   ECHOCARDIOGRAM COMPLETE  Result Date: 09/01/2020    ECHOCARDIOGRAM REPORT   Patient Name:   STACIE TEMPLIN  Date of Exam: 09/01/2020 Medical Rec #:  [id number]      Height:       63.0 in Accession #:    [id number]     Weight:       187.0 lb Date of Birth:  11-03-1965      BSA:          1.879 m Patient Age:    37 years       BP:           111/62 mmHg Patient Gender:  F              HR:           110 bpm. Exam Location:  Inpatient Procedure: 2D Echo, Cardiac Doppler and Color Doppler Indications:    Atrial fibrillation  History:        Patient has no prior history of Echocardiogram examinations.                 CHF, Arrythmias:Atrial Fibrillation, Signs/Symptoms:Altered                 Mental Status; Risk Factors:Diabetes and Hypertension. Lupus.  Sonographer:    Dustin Flock Referring Phys: [id number] [place]  1. Abnormal septal motion . Left ventricular ejection fraction, by estimation, is 50 to 55%. The left ventricle has low normal function. The left ventricle has no regional wall motion abnormalities. Left ventricular diastolic parameters are indeterminate.  2. Right ventricular systolic function is normal. The right ventricular size is normal.  3. Left atrial size was mildly dilated.  4. The mitral valve is normal in structure. Mild mitral valve regurgitation. No evidence of mitral stenosis.  5. The aortic valve is tricuspid. There is moderate calcification of the aortic valve. There is moderate thickening of the aortic valve. Aortic valve regurgitation is not visualized. Mild to moderate aortic valve stenosis.  6. The inferior vena cava is dilated in size with >50% respiratory variability, suggesting right atrial pressure of 8 mmHg. FINDINGS  Left Ventricle: Abnormal septal motion. Left ventricular ejection fraction, by estimation, is 50 to 55%. The left ventricle has low normal function. The left ventricle has no regional wall motion abnormalities. The left ventricular internal cavity size was normal in size. There is no left ventricular hypertrophy. Left ventricular diastolic parameters are  indeterminate. Right Ventricle: The right ventricular size is normal. No increase in right ventricular wall thickness. Right ventricular systolic function is normal. Left Atrium: Left atrial size was mildly dilated. Right Atrium: Right atrial size was normal in size. Pericardium: There is no evidence of pericardial effusion. Mitral Valve: The mitral valve is normal in structure. Mild mitral valve regurgitation. No evidence of mitral valve stenosis. MV peak gradient, 8.9 mmHg. The mean mitral valve gradient is 4.0 mmHg. Tricuspid Valve: The tricuspid valve is normal in structure. Tricuspid valve regurgitation is mild . No evidence of tricuspid stenosis. Aortic Valve: The aortic valve is tricuspid. There is moderate calcification of the aortic valve. There is moderate thickening of the aortic valve. Aortic valve regurgitation is not visualized. Mild to moderate aortic stenosis is present. Aortic valve mean gradient measures 21.5 mmHg. Aortic valve peak gradient measures 41.0 mmHg. Aortic valve area, by VTI measures 1.72 cm. Pulmonic Valve: The pulmonic valve was normal in structure. Pulmonic valve regurgitation is not visualized. No evidence of pulmonic stenosis. Aorta: The aortic root is normal in size and structure. Venous: The inferior vena cava is dilated in size with greater than 50% respiratory variability, suggesting right atrial pressure of 8 mmHg. IAS/Shunts: No atrial level shunt detected by color flow Doppler.  LEFT VENTRICLE PLAX 2D LVIDd:         5.00 cm  Diastology LVIDs:         3.60 cm  LV e' medial:    9.25 cm/s LV PW:         1.10 cm  LV E/e' medial:  16.4 LV IVS:        0.80 cm  LV e' lateral:  13.80 cm/s LVOT diam:     1.90 cm  LV E/e' lateral: 11.0 LV SV:         79 LV SV Index:   42 LVOT Area:     2.84 cm  RIGHT VENTRICLE RV Basal diam:  3.00 cm RV S prime:     10.70 cm/s TAPSE (M-mode): 2.4 cm LEFT ATRIUM             Index       RIGHT ATRIUM           Index LA diam:        4.00 cm 2.13 cm/m   RA Area:     20.50 cm LA Vol (A2C):   56.0 ml 29.80 ml/m RA Volume:   56.50 ml  30.07 ml/m LA Vol (A4C):   86.8 ml 46.19 ml/m LA Biplane Vol: 72.3 ml 38.47 ml/m  AORTIC VALVE AV Area (Vmax):    1.75 cm AV Area (Vmean):   1.92 cm AV Area (VTI):     1.72 cm AV Vmax:           320.00 cm/s AV Vmean:          213.000 cm/s AV VTI:            0.460 m AV Peak Grad:      41.0 mmHg AV Mean Grad:      21.5 mmHg LVOT Vmax:         198.00 cm/s LVOT Vmean:        144.000 cm/s LVOT VTI:          0.279 m LVOT/AV VTI ratio: 0.61  AORTA Ao Root diam: 2.90 cm MITRAL VALVE MV Area (PHT): 3.99 cm     SHUNTS MV Area VTI:   2.43 cm     Systemic VTI:  0.28 m MV Peak grad:  8.9 mmHg     Systemic Diam: 1.90 cm MV Mean grad:  4.0 mmHg MV Vmax:       1.49 m/s MV Vmean:      88.7 cm/s MV Decel Time: 190 msec MV E velocity: 152.00 cm/s MV A velocity: 108.00 cm/s MV E/A ratio:  1.41 Jenkins Rouge MD Electronically signed by Jenkins Rouge MD Signature Date/Time: 09/01/2020/9:22:18 AM    Final    US THYROID  Result Date: 09/02/2020 CLINICAL DATA:  55 year old with thyrotoxicosis. EXAM: THYROID ULTRASOUND TECHNIQUE: Ultrasound examination of the thyroid gland and adjacent soft tissues was performed. COMPARISON:  04/28/2020 FINDINGS: Parenchymal Echotexture: Mildly heterogenous Isthmus: 0.9 cm, previously 1.1 cm Right lobe: 6.0 x 2.3 x 2.6 cm, previously 6.0 x 2.3 x 2.0 cm Left lobe: 4.7 x 2.1 x 1.5 cm, previously 4.6 x 2.1 x 1.9 cm _________________________________________________________ Estimated total number of nodules >/= 1 cm: 0 Number of spongiform nodules >/=  2 cm not described below (TR1): 0 Number of mixed cystic and solid nodules >/= 1.5 cm not described below ([place]): 0 _________________________________________________________ Echogenic focus in the right inferior thyroid lobe is suggestive for calcification with posterior acoustic shadowing. This macrocalcification measures up to 0.4 cm. Thyroid tissue is hypervascular.  Hypervascularity is similar to the prior examination. IMPRESSION: Stable thyromegaly without suspicious nodules. The above is in keeping with the ACR TI-RADS recommendations - J Am Coll Radiol 2017;14:587-595. Electronically Signed   By: Markus Daft M.D.   On: 09/02/2020 07:45     Discharge Instructions: Discharge Instructions    Call MD for:  difficulty breathing, headache or visual disturbances  Complete by: As directed    Call MD for:  extreme fatigue   Complete by: As directed    Call MD for:  persistant dizziness or light-headedness   Complete by: As directed    Call MD for:  persistant nausea and vomiting   Complete by: As directed    Call MD for:  redness, tenderness, or signs of infection (pain, swelling, redness, odor or green/yellow discharge around incision site)   Complete by: As directed    Call MD for:  severe uncontrolled pain   Complete by: As directed    Call MD for:  temperature >100.4   Complete by: As directed    Change dressing (specify)   Complete by: As directed    Dressing procedure/placement/frequency:  MEDICATE PATIENT PATIENT PRIOR TO DRESSING CHANGES. MOISTEN OLD DRESSING PRIOR TO REMOVING.Apply to all wounds on Bilateral feet/lower legs. Cover with ABD pads, secure with kerlex.  Twice daily Triple Paste has been ordered for the thigh wounds.  Use zinc oxide and silver sulfadiazine as prescribed   Diet - low sodium heart healthy   Complete by: As directed    Increase activity slowly   Complete by: As directed       Signed: Riesa Pope, MD 09/17/2020, 12:26 PM   Pager: [phone]

## 2020-09-10 NOTE — Progress Notes (Signed)
HD#10 Subjective:   Ms. Garver states that she is doing well other than increased pain with wound care changes yesterday.  Otherwise states she is feeling well.  Discussed with her discharge planning SNF versus going home with family who can assist her until her Medicaid is approved.  She recommended I speak with her sister Ms. McGill.  Objective:  Vital signs in last 24 hours: Vitals:   09/09/20 0823 09/09/20 1505 09/09/20 2001 09/10/20 0412  BP: 110/61 108/61 (!) 90/50 101/68  Pulse: 65 72 69 62  Resp: 15 15 18 17   Temp: 99 F (37.2 C) 99.4 F (37.4 C) 98 F (36.7 C) 98.4 F (36.9 C)  TempSrc: Oral Oral  Oral  SpO2: 100% 100% 100% 100%  Weight:      Height:       Supplemental O2: Room Air SpO2: 100 % O2 Flow Rate (L/min): 2 L/min   Physical Exam:  Constitutional: Well-appearing female HENT: normocephalic atraumatic Eyes: conjunctiva non-erythematous Neck: supple Cardiovascular: Regular rate and rhythm.  Systolic murmur, 2/6 best appreciated over right second intercostal space. Pulmonary/Chest: No acute respiratory distress. MSK: normal bulk and tone Neurological: Alert and oriented x 3 Skin: Patient has warm extremities. 1+pitting edema bilaterally in her lower extremities, improved since yesterday.  Foot wounds wrapped.    Filed Weights   08/31/20 1042  Weight: 84.8 kg     Intake/Output Summary (Last 24 hours) at 09/10/2020 0635 Last data filed at 09/09/2020 1700 Gross per 24 hour  Intake 360 ml  Output -  Net 360 ml   Net IO Since Admission: 436.09 mL [09/10/20 0635]  No results for input(s): GLUCAP in the last 72 hours.   Pertinent Labs: CBC Latest Ref Rng & Units 09/10/2020 09/09/2020 09/08/2020  WBC 4.0 - 10.5 K/uL 4.5 4.3 3.8(L)  Hemoglobin 12.0 - 15.0 g/dL 7.5(L) 7.5(L) 7.6(L)  Hematocrit 36.0 - 46.0 % 23.8(L) 24.5(L) 25.5(L)  Platelets 150 - 400 K/uL 154 159 101(L)    CMP Latest Ref Rng & Units 09/10/2020 09/09/2020 09/08/2020  Glucose 70 - 99  mg/dL 95 92 94  BUN 6 - 20 mg/dL <5(L) 7 7  Creatinine 0.44 - 1.00 mg/dL 0.54 0.45 0.44  Sodium 135 - 145 mmol/L 135 132(L) 132(L)  Potassium 3.5 - 5.1 mmol/L 3.8 4.0 4.5  Chloride 98 - 111 mmol/L 107 105 107  CO2 22 - 32 mmol/L 24 21(L) 19(L)  Calcium 8.9 - 10.3 mg/dL 7.5(L) 7.8(L) 8.0(L)  Total Protein 6.5 - 8.1 g/dL 6.6 - 6.4(L)  Total Bilirubin 0.3 - 1.2 mg/dL 0.8 - 0.6  Alkaline Phos 38 - 126 U/L 146(H) - 133(H)  AST 15 - 41 U/L 52(H) - 48(H)  ALT 0 - 44 U/L 33 - 22    Imaging: No results found.  Assessment/Plan:   Active Problems:   Acute encephalopathy   Atrial fibrillation with RVR (HCC)   Ulcers of both lower extremities (HCC)   Thyrotoxicosis   Wound infection   Other pancytopenia ([place])   Patient Summary: Candace Robinson is a 55 y.o. with a pertinent PMH of CHF, A. fib, hypertension, type 2 diabetes, venous insufficiency with lower extremity ulcers, who presented with altered mental status and lower extremity wounds and admitted for severe thyrotoxicosis.   #Thyrotoxicosis, severe #Pancytopenia: Asymptomatic and hemodynamically stable.  No new concerns.  WBC and platelets improving. -Continue methimazole 30 mg -Decrease propranolol from TID to BID -Daily CBC, once discharged recommend repeating on a weekly basis until able  to follow-up with repeat thyroid function tests. -If agranulocytosis is a concern then we will switch back to PTU and consult general surgery for possible thyroidectomy.  Lower extremity ulcers, cellulitis: Continues to have lower extremity wounds with significant pain.  Thought to be source of patient symptoms and sepsis was considered, but was ruled out. -Continue doxycycline day 11 and tolerating well  -End date of ABX 03/14 -Continue pain meds with wound care management -Blood cultures/urine cultures negative  Asymptomatic hyponatremia Patient with hyponatremia since admission.  Resolved past 2 days.  Normocytic Anemia: Likely iron  deficiency anemia.  -Once patient finishes antibiotics plan to dispo with iron supplementation and follow-up with primary care provider for resolution.  Diet: Regular diet, thin IVF: None,None VTE: Enoxaparin Code: Full PT/OT recs: SNF TOC recs: None Family Update: Patient sister, Ms. Loraine Maple, updated yesterday.  Dispo: Anticipated discharge in  1 to 2 days.  Updated patient's sister Ms. McGill.  We spoke about plans moving forward, she would like to have a meeting with the patient as well as social work to determine how long the [employer] approval may take.  Patient sister states that she understands risks and benefits of keeping patient in the hospital as well as discharging her with family.  Plan to have family meeting tomorrow to further discuss disposition.  Sanjuana Letters DO  Internal Medicine Resident PGY-1 [place]  Pager: [phone]  Please contact the on call pager after 5 pm and on weekends at [phone].

## 2020-09-11 LAB — CBC
HCT: 24 % — ABNORMAL LOW (ref 36.0–46.0)
Hemoglobin: 7.4 g/dL — ABNORMAL LOW (ref 12.0–15.0)
MCH: 24.7 pg — ABNORMAL LOW (ref 26.0–34.0)
MCHC: 30.8 g/dL (ref 30.0–36.0)
MCV: 80.3 fL (ref 80.0–100.0)
Platelets: 221 10*3/uL (ref 150–400)
RBC: 2.99 MIL/uL — ABNORMAL LOW (ref 3.87–5.11)
RDW: 17.2 % — ABNORMAL HIGH (ref 11.5–15.5)
WBC: 4.4 10*3/uL (ref 4.0–10.5)
nRBC: 0 % (ref 0.0–0.2)

## 2020-09-11 LAB — BASIC METABOLIC PANEL
Anion gap: 5 (ref 5–15)
BUN: <5 mg/dL — ABNORMAL LOW (ref 6–20)
CO2: 22 mmol/L (ref 22–32)
Calcium: 7.4 mg/dL — ABNORMAL LOW (ref 8.9–10.3)
Chloride: 107 mmol/L (ref 98–111)
Creatinine, Ser: 0.43 mg/dL — ABNORMAL LOW (ref 0.44–1.00)
GFR, Estimated: >60 mL/min (ref >60)
Glucose, Bld: 87 mg/dL (ref 70–99)
Potassium: 3.9 mmol/L (ref 3.5–5.1)
Sodium: 134 mmol/L — ABNORMAL LOW (ref 135–145)

## 2020-09-11 MED ORDER — OXYCODONE HCL 5 MG PO TABS: 5.0000 mg | ORAL_TABLET | Freq: Once | ORAL | Status: DC

## 2020-09-11 MED ORDER — OXYCODONE HCL 5 MG PO TABS
5.0000 mg | ORAL_TABLET | Freq: Once | ORAL | Status: AC | PRN
Start: 2020-09-11 — End: 2020-09-12
  Administered 2020-09-12: 5 mg via ORAL
  Filled 2020-09-11 (×2): qty 1

## 2020-09-11 NOTE — Plan of Care (Signed)

## 2020-09-11 NOTE — TOC Progression Note (Incomplete)
Transition of Care [facility]) - Progression Note    Patient Details  Name: Candace Robinson MRN: [id number] Date of Birth: 09/19/1965  Transition of [facility]) CM/SW Contact  Sharin Mons, RN Phone Number: 09/11/2020, 1:31 PM  Clinical Narrative:    Pt faxed out to SNF    Expected Discharge Plan: [place] Barriers to Discharge: No SNF bed  Expected Discharge Plan and Services Expected Discharge Plan: [place] arrangements for the past 2 months: Single Family Home                                       Social Determinants of Health (SDOH) Interventions    Readmission Risk Interventions No flowsheet data found.

## 2020-09-11 NOTE — Progress Notes (Signed)
HD#11 Subjective:  Candace Robinson is sitting in the recliner, resting comfortably. She states she feels overall better today. Does continue to endorse intermittent foot pain with dressing changes of her wound. Denies chest pain or shortness of breath. Has no new concerns. States she talked with Jeannetta Nap today about SNF placement and that her medicaid is still pending.    Objective:  Vital signs in last 24 hours: Vitals:   09/10/20 0734 09/10/20 1535 09/10/20 1945 09/11/20 0326  BP: (!) 91/56 (!) 94/58 (!) 99/54 (!) 100/59  Pulse: 66 65 62 63  Resp: 17 17 18 18   Temp: 98 F (36.7 C) 98.1 F (36.7 C) 98.6 F (37 C) 98.4 F (36.9 C)  TempSrc: Oral Oral Oral Oral  SpO2: 99% 100% 100% 100%  Weight:      Height:       Supplemental O2: Room Air   Physical Exam:  Constitutional: Well-appearing female HENT: normocephalic atraumatic Eyes: conjunctiva non-erythematous Neck: supple Cardiovascular: Regular rate and rhythm.  Systolic murmur, 2/6 best appreciated over right second intercostal space. Pulmonary/Chest: No acute respiratory distress. MSK: normal bulk and tone Neurological: Alert and oriented x 3 Skin: Patient has warm extremities.  Filed Weights   08/31/20 1042  Weight: 84.8 kg     Intake/Output Summary (Last 24 hours) at 09/11/2020 0629 Last data filed at 09/10/2020 1700 Gross per 24 hour  Intake 720 ml  Output 650 ml  Net 70 ml   Net IO Since Admission: 506.09 mL [09/11/20 0629]  No results for input(s): GLUCAP in the last 72 hours.   Pertinent Labs: CBC Latest Ref Rng & Units 09/10/2020 09/09/2020 09/08/2020  WBC 4.0 - 10.5 K/uL 4.5 4.3 3.8(L)  Hemoglobin 12.0 - 15.0 g/dL 7.5(L) 7.5(L) 7.6(L)  Hematocrit 36.0 - 46.0 % 23.8(L) 24.5(L) 25.5(L)  Platelets 150 - 400 K/uL 154 159 101(L)    CMP Latest Ref Rng & Units 09/10/2020 09/09/2020 09/08/2020  Glucose 70 - 99 mg/dL 95 92 94  BUN 6 - 20 mg/dL <5(L) 7 7  Creatinine 0.44 - 1.00 mg/dL 0.54 0.45 0.44  Sodium 135 -  145 mmol/L 135 132(L) 132(L)  Potassium 3.5 - 5.1 mmol/L 3.8 4.0 4.5  Chloride 98 - 111 mmol/L 107 105 107  CO2 22 - 32 mmol/L 24 21(L) 19(L)  Calcium 8.9 - 10.3 mg/dL 7.5(L) 7.8(L) 8.0(L)  Total Protein 6.5 - 8.1 g/dL 6.6 - 6.4(L)  Total Bilirubin 0.3 - 1.2 mg/dL 0.8 - 0.6  Alkaline Phos 38 - 126 U/L 146(H) - 133(H)  AST 15 - 41 U/L 52(H) - 48(H)  ALT 0 - 44 U/L 33 - 22    Imaging: No results found.  Assessment/Plan:   Active Problems:   Acute encephalopathy   Atrial fibrillation with RVR (HCC)   Ulcers of both lower extremities (HCC)   Thyrotoxicosis   Wound infection   Other pancytopenia ([place])   Patient Summary: Candace Robinson is a 55 y.o. with a pertinent PMH of CHF, A. fib, hypertension, type 2 diabetes, venous insufficiency with lower extremity ulcers, who presented with altered mental status and lower extremity wounds and admitted for severe thyrotoxicosis.   #Thyrotoxicosis, severe #Pancytopenia: Asymptomatic and hemodynamically stable.  No new concerns.  WBC improving and platelets with significant improvement.  -Continue methimazole 30 mg -Decrease propranolol from TID to BID -Daily CBC, once discharged recommend repeating on a weekly basis until able to follow-up with repeat thyroid function tests. -If agranulocytosis is a concern then we will  switch back to PTU and consult general surgery for possible thyroidectomy.  Lower extremity ulcers, cellulitis: Continues to have lower extremity wounds with significant pain. Discussed with patient if she needs additional pain medication to request nursing staff page [place] during dressing changes.  -Continue doxycycline day 12 and tolerating well  -End date of ABX 03/14 -Continue pain meds with wound care management -Blood cultures/urine cultures negative  Asymptomatic hyponatremia Patient with hyponatremia since admission that had improved yesterday. We will continue to monitor.   Normocytic Anemia: Likely iron  deficiency anemia.  -Once patient finishes antibiotics plan to dispo with iron supplementation and follow-up with primary care provider for resolution.  Diet: Regular diet, thin IVF: None,None VTE: Enoxaparin Code: Full PT/OT recs: SNF TOC recs: None Family Update: Patient sister, Candace Robinson, updated yesterday. Will call today to see if she spoke with our social work team to discuss disposition.   Dispo: Anticipated discharge in  1 to 2 days, pending medicaid approval as well as SNF placement. Patient may be able to go home with sister while awaiting SNF placement and medicaid approval.   [place]  Internal Medicine Resident PGY-1 [place]  Pager: [phone]  Please contact the on call pager after 5 pm and on weekends at [phone].

## 2020-09-11 NOTE — Progress Notes (Signed)
Occupational Therapy Treatment Patient Details Name: Candace Robinson MRN: [id number] DOB: 02-20-1966 Today's Date: 09/11/2020    History of present illness The pt is a 55 yo female who presenting 2/28 due to concerns of BLE ulcers and AMS. 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 PMH includes: CHF, afib, HTN, DM II, lupus, heart mumur, and chronic venous insufficiency.   OT comments  Pt making steady progress towards OT goals this session. Continued work on Training and development officer via anterior/ posterior transfer to recliner with pt needing MIN A +2 to scoot posteriorly into recliner, pt requires MAX multimodal cues for transfer technique.Pt continues to endorse pain in BLEs unable tolerate BLEs dangling against gravity. Pt completed light therex as indicated below with no reports of increased pain. Pt would continue to benefit from skilled occupational therapy while admitted and after d/c to address the below listed limitations in order to improve overall functional mobility and facilitate independence with BADL participation. DC plan remains appropriate, will follow acutely per POC.     Follow Up Recommendations  SNF    Equipment Recommendations  None recommended by OT    Recommendations for Other Services      Precautions / Restrictions Precautions Precautions: Fall;Other (comment) Precaution Comments: wounds to BLE Restrictions Weight Bearing Restrictions: No       Mobility Bed Mobility Overal bed mobility: Needs Assistance Bed Mobility: Supine to Sit     Supine to sit: Supervision     General bed mobility comments: Pt able to progress from supine to long sitting w/o assist, cues provided for technique and body mechanics    Transfers Overall transfer level: Needs assistance   Transfers: Anterior-Posterior Transfer       Anterior-Posterior transfers: Min assist;+2 physical assistance   General transfer comment: Max cues required for transfer. Pt uncoordinated  fluidly scooting hips backward therefore pt preferring to rock side to side to inch hips backwards despite attempts to cue pt to use BUEs to scoot hips posteriorly and shift trunk anterioly, MIN A +2 with use of bed pad to fully scoot pt into recliner    Balance Overall balance assessment: Needs assistance Sitting-balance support: Feet supported;Bilateral upper extremity supported Sitting balance-Leahy Scale: Good Sitting balance - Comments: pt able to maintain upright trunk during AP transfer.                                   ADL either performed or assessed with clinical judgement   ADL Overall ADL's : Needs assistance/impaired                         Toilet Transfer: Minimal assistance;+2 for physical assistance Toilet Transfer Details (indicate cue type and reason): simulated via AP transfer into recliner, MIN A +2 to scoot hips backwards into recliner with use of bed pads         Functional mobility during ADLs: Minimal assistance;+2 for physical assistance (AP transfer) General ADL Comments: pt able to mobilize OOB to recliner via AP transfer with MIN A +2, pt continues to report increased pain in BLEs     Vision       Perception     Praxis      Cognition Arousal/Alertness: Awake/alert Behavior During Therapy: WFL for tasks assessed/performed Overall Cognitive Status: Impaired/Different from baseline Area of Impairment: Following commands;Problem solving  Following Commands: Follows multi-step commands inconsistently;Follows multi-step commands with increased time;Follows one step commands consistently     Problem Solving: Slow processing;Requires verbal cues;Requires tactile cues;Difficulty sequencing;Decreased initiation General Comments: Max VC provided for AP transfer, Slow processing.        Exercises General Exercises - Upper Extremity Shoulder Flexion: AROM;Both;10 reps;Seated Shoulder Extension:  AROM;Both;10 reps;Seated Elbow Flexion: AROM;Both;10 reps;Seated Elbow Extension: AROM;Both;Seated;10 reps General Exercises - Lower Extremity Ankle Circles/Pumps: AROM;Both;10 reps;Seated Straight Leg Raises: AROM;Both;10 reps;Seated Other Exercises Other Exercises: punches forward with BUEs x10 reps from recliner Other Exercises: BUEs positioned on arm rests of chair shiftigng trunk forward and back to facilitate triceps and core activation   Shoulder Instructions       General Comments bilateral feet with dressing applied, appearing dry    Pertinent Vitals/ Pain       Pain Assessment: Faces Faces Pain Scale: Hurts little more Pain Location: BLE Pain Descriptors / Indicators: Grimacing;Discomfort Pain Intervention(s): Limited activity within patient's tolerance;Monitored during session;Repositioned  Home Living                                          Prior Functioning/Environment              Frequency  Min 2X/week        Progress Toward Goals  OT Goals(current goals can now be found in the care plan section)  Progress towards OT goals: Progressing toward goals  Acute Rehab OT Goals Patient Stated Goal: to get to chair OT Goal Formulation: With patient Time For Goal Achievement: 09/17/20 Potential to Achieve Goals: Good  Plan Discharge plan remains appropriate;Frequency remains appropriate    Co-evaluation                 AM-PAC OT "6 Clicks" Daily Activity     Outcome Measure   Help from another person eating meals?: None Help from another person taking care of personal grooming?: A Little Help from another person toileting, which includes using toliet, bedpan, or urinal?: A Lot Help from another person bathing (including washing, rinsing, drying)?: A Lot Help from another person to put on and taking off regular upper body clothing?: A Little Help from another person to put on and taking off regular lower body clothing?:  Total 6 Click Score: 15    End of Session    OT Visit Diagnosis: Unsteadiness on feet (R26.81);Other abnormalities of gait and mobility (R26.89);Muscle weakness (generalized) (M62.81);Pain Pain - Right/Left:  (bilateral) Pain - part of body: Leg   Activity Tolerance Patient tolerated treatment well   Patient Left in chair;with call bell/phone within reach;with chair alarm set   Nurse Communication Mobility status;Other (comment) (AP back to bed, visually demo'ed technique for RN)        Time: 7628-3151 OT Time Calculation (min): 19 min  Charges: OT General Charges $OT Visit: 1 Visit OT Treatments $Therapeutic Activity: 8-22 mins  Harley Alto., COTA/L Acute Rehabilitation Services Ollie Kanoy 09/11/2020, 4:46 PM

## 2020-09-12 LAB — BASIC METABOLIC PANEL
Anion gap: 4 — ABNORMAL LOW (ref 5–15)
BUN: <5 mg/dL — ABNORMAL LOW (ref 6–20)
CO2: 22 mmol/L (ref 22–32)
Calcium: 7.3 mg/dL — ABNORMAL LOW (ref 8.9–10.3)
Chloride: 107 mmol/L (ref 98–111)
Creatinine, Ser: 0.5 mg/dL (ref 0.44–1.00)
GFR, Estimated: >60 mL/min (ref >60)
Glucose, Bld: 92 mg/dL (ref 70–99)
Potassium: 3.9 mmol/L (ref 3.5–5.1)
Sodium: 133 mmol/L — ABNORMAL LOW (ref 135–145)

## 2020-09-12 LAB — CBC WITH DIFFERENTIAL/PLATELET
Abs Immature Granulocytes: 0.01 10*3/uL (ref 0.00–0.07)
Basophils Absolute: 0 10*3/uL (ref 0.0–0.1)
Basophils Relative: 0 %
Eosinophils Absolute: 0.2 10*3/uL (ref 0.0–0.5)
Eosinophils Relative: 4 %
HCT: 24.6 % — ABNORMAL LOW (ref 36.0–46.0)
Hemoglobin: 7.4 g/dL — ABNORMAL LOW (ref 12.0–15.0)
Immature Granulocytes: 0 %
Lymphocytes Relative: 33 %
Lymphs Abs: 1.7 10*3/uL (ref 0.7–4.0)
MCH: 24.3 pg — ABNORMAL LOW (ref 26.0–34.0)
MCHC: 30.1 g/dL (ref 30.0–36.0)
MCV: 80.7 fL (ref 80.0–100.0)
Monocytes Absolute: 0.4 10*3/uL (ref 0.1–1.0)
Monocytes Relative: 8 %
Neutro Abs: 2.8 10*3/uL (ref 1.7–7.7)
Neutrophils Relative %: 55 %
Platelets: 238 10*3/uL (ref 150–400)
RBC: 3.05 MIL/uL — ABNORMAL LOW (ref 3.87–5.11)
RDW: 17.3 % — ABNORMAL HIGH (ref 11.5–15.5)
WBC: 5.1 10*3/uL (ref 4.0–10.5)
nRBC: 0 % (ref 0.0–0.2)

## 2020-09-12 NOTE — Progress Notes (Signed)
HD#12 Subjective:  Overnight: No acute events overnight  Patient resting in bed comfortably this morning. She denies any new concerns this morning. She has had some itching of her feet with dressing changes, but states lower extremity wounds are improving. She has had minimal pain with dressing changes over the past day. Discussed with her plan to have family meeting Monday 09/14/20 with her sister, Candace Robinson, and social work to determine disposition. Patient agrees with plan.    Objective:  Vital signs in last 24 hours: Vitals:   09/11/20 0326 09/11/20 0753 09/11/20 2000 09/12/20 0400  BP: (!) 100/59 99/60 103/66 110/77  Pulse: 63 62 66 70  Resp: 18 16 17 17   Temp: 98.4 F (36.9 C) 98.3 F (36.8 C) 98.2 F (36.8 C) 98.1 F (36.7 C)  TempSrc: Oral Oral Oral Oral  SpO2: 100% 100% 99% 98%  Weight:      Height:       Supplemental O2: Room Air   Physical Exam:  Constitutional: Well-appearing female HENT: normocephalic atraumatic Eyes: conjunctiva non-erythematous Neck: supple Cardiovascular: Regular rate and rhythm.  Systolic murmur, 2/6 best appreciated over right second intercostal space. Pulmonary/Chest: No acute respiratory distress. MSK: normal bulk and tone Neurological: Alert and oriented x 3 Skin: Patient has warm extremities.  Filed Weights   08/31/20 1042  Weight: 84.8 kg     Intake/Output Summary (Last 24 hours) at 09/12/2020 0748 Last data filed at 09/11/2020 1400 Gross per 24 hour  Intake 600 ml  Output 1000 ml  Net -400 ml   Net IO Since Admission: 106.09 mL [09/12/20 0748]  No results for input(s): GLUCAP in the last 72 hours.   Pertinent Labs: CBC Latest Ref Rng & Units 09/12/2020 09/11/2020 09/10/2020  WBC 4.0 - 10.5 K/uL 5.1 4.4 4.5  Hemoglobin 12.0 - 15.0 g/dL 7.4(L) 7.4(L) 7.5(L)  Hematocrit 36.0 - 46.0 % 24.6(L) 24.0(L) 23.8(L)  Platelets 150 - 400 K/uL 238 221 154    CMP Latest Ref Rng & Units 09/12/2020 09/11/2020 09/10/2020  Glucose 70 -  99 mg/dL 92 87 95  BUN 6 - 20 mg/dL <5(L) <5(L) <5(L)  Creatinine 0.44 - 1.00 mg/dL 0.50 0.43(L) 0.54  Sodium 135 - 145 mmol/L 133(L) 134(L) 135  Potassium 3.5 - 5.1 mmol/L 3.9 3.9 3.8  Chloride 98 - 111 mmol/L 107 107 107  CO2 22 - 32 mmol/L 22 22 24   Calcium 8.9 - 10.3 mg/dL 7.3(L) 7.4(L) 7.5(L)  Total Protein 6.5 - 8.1 g/dL - - 6.6  Total Bilirubin 0.3 - 1.2 mg/dL - - 0.8  Alkaline Phos 38 - 126 U/L - - 146(H)  AST 15 - 41 U/L - - 52(H)  ALT 0 - 44 U/L - - 33    Imaging: No results found.  Assessment/Plan:   Active Problems:   Acute encephalopathy   Atrial fibrillation with RVR (HCC)   Ulcers of both lower extremities (HCC)   Thyrotoxicosis   Wound infection   Other pancytopenia ([place])   Patient Summary: Candace Robinson is a 55 y.o. with a pertinent PMH of CHF, A. fib, hypertension, type 2 diabetes, venous insufficiency with lower extremity ulcers, who presented with altered mental status and lower extremity wounds and admitted for severe thyrotoxicosis.   #Thyrotoxicosis, severe #Pancytopenia - resolved Asymptomatic and hemodynamically stable.  No new concerns this morning.  WBC and platelets within normal limits since decreasing methimazole to 30 mg. -Continue methimazole 30 mg -Continue propranolol twice daily -Daily CBC, once discharged recommend repeating  on a weekly basis until able to follow-up with repeat thyroid function tests. -If agranulocytosis is a concern then we will switch back to PTU and consult general surgery for possible thyroidectomy.  Lower extremity ulcers, cellulitis: Continues to have lower extremity wounds with well controlled pain with medication regimen. Requested nurse page me when dressing change occurs so that her wounds may be reevaluated.  -Assess wounds today -Continue doxycycline day 13 and tolerating well  -End date of ABX 03/14 -Continue pain meds with wound care management -Blood cultures/urine cultures negative  Asymptomatic  hyponatremia Patient with intermittent hyponatremia during her admission. Will continue to monitor.  -daily BMP  Normocytic Anemia: Likely iron deficiency anemia.  -Once patient finishes antibiotics plan to dispo with iron supplementation and follow-up with primary care provider for resolution.  Diet: Regular diet, thin IVF: None,None VTE: Enoxaparin Code: Full PT/OT recs: SNF TOC recs: None Family Update: Patient sister, [name], updated yesterday. Will call today to see if she spoke with our social work team to discuss disposition.   Dispo: Anticipated discharge pending placement to SNF, pending medicaid application. Patient working with sister, Candace Robinson, and social work to determine whether or not she can be discharged with sister pending SNF placement. Family meeting 09/14/20 to discuss disposition.  Sanjuana Letters DO  Internal Medicine Resident PGY-1 [place]  Pager: [phone]  Please contact the on call pager after 5 pm and on weekends at [phone].

## 2020-09-13 LAB — BASIC METABOLIC PANEL
Anion gap: 3 — ABNORMAL LOW (ref 5–15)
BUN: <5 mg/dL — ABNORMAL LOW (ref 6–20)
CO2: 21 mmol/L — ABNORMAL LOW (ref 22–32)
Calcium: 7.4 mg/dL — ABNORMAL LOW (ref 8.9–10.3)
Chloride: 109 mmol/L (ref 98–111)
Creatinine, Ser: 0.44 mg/dL (ref 0.44–1.00)
GFR, Estimated: >60 mL/min (ref >60)
Glucose, Bld: 90 mg/dL (ref 70–99)
Potassium: 4.1 mmol/L (ref 3.5–5.1)
Sodium: 133 mmol/L — ABNORMAL LOW (ref 135–145)

## 2020-09-13 LAB — CBC
HCT: 27 % — ABNORMAL LOW (ref 36.0–46.0)
Hemoglobin: 8.1 g/dL — ABNORMAL LOW (ref 12.0–15.0)
MCH: 24.3 pg — ABNORMAL LOW (ref 26.0–34.0)
MCHC: 30 g/dL (ref 30.0–36.0)
MCV: 80.8 fL (ref 80.0–100.0)
Platelets: 265 10*3/uL (ref 150–400)
RBC: 3.34 MIL/uL — ABNORMAL LOW (ref 3.87–5.11)
RDW: 17.3 % — ABNORMAL HIGH (ref 11.5–15.5)
WBC: 4.4 10*3/uL (ref 4.0–10.5)
nRBC: 0 % (ref 0.0–0.2)

## 2020-09-13 NOTE — Progress Notes (Signed)
HD#13 Subjective:  Overnight Events: no overnight events.   Patient resting in bed.  She appeared comfortable.  She denies any acute complaints.  States that she continues to have some lower extremity pain but it is improving.  Objective:  Vital signs in last 24 hours: Vitals:   09/12/20 1513 09/12/20 2056 09/13/20 0359 09/13/20 0700  BP: 112/76 (!) 102/53 111/61 96/60  Pulse: 66 63 62 (!) 59  Resp: 18 17 17 17   Temp: 98.6 F (37 C) 98 F (36.7 C) 98 F (36.7 C) 98.6 F (37 C)  TempSrc: Oral   Oral  SpO2: 100% 99% 100% 100%  Weight:      Height:       Supplemental O2: Room Air SpO2: 100 % O2 Flow Rate (L/min): 2 L/min   Physical Exam:  Physical Exam Constitutional:      Appearance: Normal appearance.  HENT:     Head: Normocephalic and atraumatic.  Eyes:     Extraocular Movements: Extraocular movements intact.  Cardiovascular:     Rate and Rhythm: Normal rate.     Pulses: Normal pulses.     Heart sounds: Normal heart sounds.  Pulmonary:     Effort: Pulmonary effort is normal.     Breath sounds: Normal breath sounds.  Abdominal:     General: Bowel sounds are normal.     Palpations: Abdomen is soft.     Tenderness: There is no abdominal tenderness.  Musculoskeletal:        General: Swelling (bilateral LE swelling) present. Normal range of motion.     Cervical back: Normal range of motion.  Skin:    General: Skin is warm and dry.     Findings: Wound (bilateral lower extremities) present.  Neurological:     Mental Status: She is alert and oriented to person, place, and time. Mental status is at baseline.  Psychiatric:        Mood and Affect: Mood normal.     Filed Weights   08/31/20 1042  Weight: 84.8 kg     Intake/Output Summary (Last 24 hours) at 09/13/2020 1234 Last data filed at 09/13/2020 1100 Gross per 24 hour  Intake 240 ml  Output 400 ml  Net -160 ml   Net IO Since Admission: -53.91 mL [09/13/20 1234]  No results for input(s): GLUCAP  in the last 72 hours.   Pertinent Labs: CBC Latest Ref Rng & Units 09/13/2020 09/12/2020 09/11/2020  WBC 4.0 - 10.5 K/uL 4.4 5.1 4.4  Hemoglobin 12.0 - 15.0 g/dL 8.1(L) 7.4(L) 7.4(L)  Hematocrit 36.0 - 46.0 % 27.0(L) 24.6(L) 24.0(L)  Platelets 150 - 400 K/uL 265 238 221    CMP Latest Ref Rng & Units 09/13/2020 09/12/2020 09/11/2020  Glucose 70 - 99 mg/dL 90 92 87  BUN 6 - 20 mg/dL <5(L) <5(L) <5(L)  Creatinine 0.44 - 1.00 mg/dL 0.44 0.50 0.43(L)  Sodium 135 - 145 mmol/L 133(L) 133(L) 134(L)  Potassium 3.5 - 5.1 mmol/L 4.1 3.9 3.9  Chloride 98 - 111 mmol/L 109 107 107  CO2 22 - 32 mmol/L 21(L) 22 22  Calcium 8.9 - 10.3 mg/dL 7.4(L) 7.3(L) 7.4(L)  Total Protein 6.5 - 8.1 g/dL - - -  Total Bilirubin 0.3 - 1.2 mg/dL - - -  Alkaline Phos 38 - 126 U/L - - -  AST 15 - 41 U/L - - -  ALT 0 - 44 U/L - - -    Imaging: No results found.  Assessment/Plan:  Active Problems:   Acute encephalopathy   Atrial fibrillation with RVR (HCC)   Ulcers of both lower extremities (HCC)   Thyrotoxicosis   Wound infection   Other pancytopenia ([place])   Patient Summary: Candace Robinson a 55 y.o.with a pertinent PMH of CHF, A. fib, hypertension, type 2 diabetes, venous insufficiency with lower extremity ulcers, who presented with altered mental status and lower extremity wounds and admitted for severe thyrotoxicosis.   #Thyrotoxicosis, severe #Pancytopenia - resolved Patient stable and denies any acute complaints tolerating methimazole 30 mg daily. -Continue methimazole 30 mg and propanolol twice daily -We will need outpatient follow-up with endocrinology.  Lower extremity ulcers, cellulitis: Her lower extremity wounds appear to be improving with twice daily dressing changes.  There is no acute sign of infection.  Tolerated a 14-day course of doxycycline.  She will receive her last dose today.   -Continue twice daily dressing changes -Finish 14-day course of doxycycline today -Continue current  pain management  Normocytic Anemia: -Start iron repletion. -Patient would likely benefit from oral iron -We will need follow-up labs in the outpatient setting  Diet: Regular diet, thin IVF: None,None VTE: Enoxaparin Code: Full PT/OT recs: SNF TOC recs: None Family Update: Patient sister, Ms. Loraine Maple, updated yesterday. Will call today to see if she spoke with our social work team to discuss disposition.   Dispo: Discharge pending placement  Lawerance Cruel, D.O.  Internal Medicine Resident, PGY-2 [facility]  Pager: [phone] 12:34 PM, 09/13/2020   Please contact the on call pager after 5 pm and on weekends at [phone].\

## 2020-09-13 NOTE — NC FL2 (Signed)
[place] MEDICAID FL2 LEVEL OF CARE SCREENING TOOL     IDENTIFICATION  Patient Name: Candace Robinson Birthdate: Feb 02, 1966 Sex: female Admission Date (Current Location): 08/31/2020  [facility] and [place] Number:  Herbalist and Address:  [facility] [place] [address] [place], [place] [address]      Provider Number: [id number]  Attending Physician Name and Address:  Sid Falcon, MD  Relative Name and Phone Number:  Teena Dunk [phone]    Current Level of Care: Hospital Recommended Level of Care: [place] Prior Approval Number:    Date Approved/Denied:   PASRR Number: [id number]  Discharge Plan: SNF    Current Diagnoses: Patient Active Problem List   Diagnosis Date Noted  . Wound infection   . Other pancytopenia ([place])   . Thyrotoxicosis 09/01/2020  . Atrial fibrillation with RVR ([place])   . Ulcers of both lower extremities ([place])   . Acute encephalopathy 08/31/2020    Orientation RESPIRATION BLADDER Height & Weight     Self,Time,Situation,Place (WDL)  Normal Incontinent,External catheter (External Urinary Catheter) Weight: 187 lb (84.8 kg) Height:  5\' 3"  (160 cm)  BEHAVIORAL SYMPTOMS/MOOD NEUROLOGICAL BOWEL NUTRITION STATUS      Incontinent Diet (See Discharge Summary)  AMBULATORY STATUS COMMUNICATION OF NEEDS Skin   Extensive Assist Verbally Other (Comment) (cellulitis ankle,foot bilateral,wound incision open or dehiced venous stasis ulcer buttocks left,right,heel left,right,foot anterior right,left)                       Personal Care Assistance Level of Assistance  Bathing,Feeding,Dressing Bathing Assistance: Maximum assistance Feeding assistance: Limited assistance (able to feed self,needs help sitting up) Dressing Assistance: Maximum assistance     Functional Limitations Info  Sight,Hearing,Speech Sight Info: Adequate Hearing Info: Adequate Speech Info: Adequate    SPECIAL CARE FACTORS FREQUENCY   PT (By licensed PT),OT (By licensed OT)     PT Frequency: 5x min weekly OT Frequency: 5x min weekly            Contractures Contractures Info: Not present    Additional Factors Info  Code Status,Allergies Code Status Info: FULL Allergies Info: Motrin (ibuprofen)           Current Medications (09/13/2020):  This is the current hospital active medication list Current Facility-Administered Medications  Medication Dose Route Frequency Provider Last Rate Last Admin  . acetaminophen (TYLENOL) tablet 650 mg  650 mg Oral Q6H Katsadouros, Vasilios, MD   650 mg at 09/13/20 1208  . doxycycline (VIBRA-TABS) tablet 100 mg  100 mg Oral Q12H Jeralyn Bennett, MD   100 mg at 09/13/20 0803  . enoxaparin (LOVENOX) injection 40 mg  40 mg Subcutaneous QHS Sanjuan Dame, MD   40 mg at 09/12/20 2113  . folic acid (FOLVITE) tablet 1 mg  1 mg Oral Daily Gilles Chiquito B, MD   1 mg at 09/13/20 9470  . loperamide (IMODIUM) capsule 2 mg  2 mg Oral PRN Jeralyn Bennett, MD   2 mg at 09/08/20 2341  . methimazole (TAPAZOLE) tablet 30 mg  30 mg Oral Daily Katsadouros, Vasilios, MD   30 mg at 09/13/20 0804  . ondansetron (ZOFRAN) injection 4 mg  4 mg Intravenous Q8H PRN Katsadouros, Vasilios, MD   4 mg at 09/10/20 2020  . oxyCODONE (Oxy IR/ROXICODONE) immediate release tablet 5 mg  5 mg Oral Q4H PRN Marianna Payment, MD   5 mg at 09/13/20 0530  . polycarbophil (FIBERCON) tablet 625 mg  625 mg Oral Daily Jeralyn Bennett, MD   625 mg at 09/13/20 0803  . propranolol (INDERAL) tablet 40 mg  40 mg Oral BID Katsadouros, Vasilios, MD   40 mg at 09/12/20 2110  . silver sulfADIAZINE (SILVADENE) 1 % cream   Topical BID Sid Falcon, MD   Given at 09/13/20 0805  . Vitamin D (Ergocalciferol) (DRISDOL) capsule 50,000 Units  50,000 Units Oral Q7 days Jeralyn Bennett, MD   50,000 Units at 09/10/20 1047  . Zinc Oxide (TRIPLE PASTE) 12.8 % ointment   Topical BID Sid Falcon, MD   Given at 09/13/20 0805      Discharge Medications: Please see discharge summary for a list of discharge medications.  Relevant Imaging Results:  Relevant Lab Results:   Additional Information [id number]  Trula Ore, LCSWA

## 2020-09-14 LAB — CBC
HCT: 26.2 % — ABNORMAL LOW (ref 36.0–46.0)
Hemoglobin: 8 g/dL — ABNORMAL LOW (ref 12.0–15.0)
MCH: 24.6 pg — ABNORMAL LOW (ref 26.0–34.0)
MCHC: 30.5 g/dL (ref 30.0–36.0)
MCV: 80.6 fL (ref 80.0–100.0)
Platelets: 304 10*3/uL (ref 150–400)
RBC: 3.25 MIL/uL — ABNORMAL LOW (ref 3.87–5.11)
RDW: 17.4 % — ABNORMAL HIGH (ref 11.5–15.5)
WBC: 4.7 10*3/uL (ref 4.0–10.5)
nRBC: 0 % (ref 0.0–0.2)

## 2020-09-14 LAB — BASIC METABOLIC PANEL
Anion gap: 5 (ref 5–15)
BUN: 6 mg/dL (ref 6–20)
CO2: 20 mmol/L — ABNORMAL LOW (ref 22–32)
Calcium: 7.8 mg/dL — ABNORMAL LOW (ref 8.9–10.3)
Chloride: 110 mmol/L (ref 98–111)
Creatinine, Ser: 0.44 mg/dL (ref 0.44–1.00)
GFR, Estimated: >60 mL/min (ref >60)
Glucose, Bld: 92 mg/dL (ref 70–99)
Potassium: 4.2 mmol/L (ref 3.5–5.1)
Sodium: 135 mmol/L (ref 135–145)

## 2020-09-14 LAB — RETICULOCYTES
Immature Retic Fract: 23.1 % — ABNORMAL HIGH (ref 2.3–15.9)
RBC.: 3.27 MIL/uL — ABNORMAL LOW (ref 3.87–5.11)
Retic Count, Absolute: 71 10*3/uL (ref 19.0–186.0)
Retic Ct Pct: 2.2 % (ref 0.4–3.1)

## 2020-09-14 LAB — FERRITIN: Ferritin: 62 ng/mL (ref 11–307)

## 2020-09-14 NOTE — Plan of Care (Signed)

## 2020-09-14 NOTE — Progress Notes (Signed)
HD#14 Subjective:  Overnight Events: None  Candace Robinson is resting in bed comfortably.  She has no acute concerns at this time.  States her left leg pain is managed well.  Denies any fever, chills, chest pain, palpitations.  Discussed with her that we are waiting on Medicaid approval as well as SNF placement.  She will have a meeting today with social worker as well as her sister, Candace Robinson.  Objective:  Vital signs in last 24 hours: Vitals:   09/13/20 0359 09/13/20 0700 09/13/20 1542 09/13/20 2007  BP: 111/61 96/60 121/68 (!) 100/54  Pulse: 62 (!) 59 65 70  Resp: 17 17 18 17   Temp: 98 F (36.7 C) 98.6 F (37 C) 98 F (36.7 C) 98 F (36.7 C)  TempSrc:  Oral Oral   SpO2: 100% 100% 100% 99%  Weight:      Height:       Supplemental O2: Room Air  Physical Exam:  Constitutional: No acute distress, resting in bed. HENT: normocephalic atraumatic Eyes: conjunctiva non-erythematous Neck: supple Cardiovascular: regular rate and rhythm, 2/6 systolic murmur Pulmonary/Chest: normal work of breathing on room air Abdominal: non-distended MSK: normal bulk and tone Neurological: alert & oriented x 3 Skin: warm and dry, minimal lower extremity edema.  Wounds wrapped.  See picture below from 09/12/2020 Psych: Normal mood              Filed Weights   08/31/20 1042  Weight: 84.8 kg     Intake/Output Summary (Last 24 hours) at 09/14/2020 0610 Last data filed at 09/13/2020 1300 Gross per 24 hour  Intake 480 ml  Output 500 ml  Net -20 ml   Net IO Since Admission: 86.09 mL [09/14/20 0610]  Pertinent Labs: CBC Latest Ref Rng & Units 09/13/2020 09/12/2020 09/11/2020  WBC 4.0 - 10.5 K/uL 4.4 5.1 4.4  Hemoglobin 12.0 - 15.0 g/dL 8.1(L) 7.4(L) 7.4(L)  Hematocrit 36.0 - 46.0 % 27.0(L) 24.6(L) 24.0(L)  Platelets 150 - 400 K/uL 265 238 221    CMP Latest Ref Rng & Units 09/13/2020 09/12/2020 09/11/2020  Glucose 70 - 99 mg/dL 90 92 87  BUN 6 - 20 mg/dL <5(L) <5(L) <5(L)   Creatinine 0.44 - 1.00 mg/dL 0.44 0.50 0.43(L)  Sodium 135 - 145 mmol/L 133(L) 133(L) 134(L)  Potassium 3.5 - 5.1 mmol/L 4.1 3.9 3.9  Chloride 98 - 111 mmol/L 109 107 107  CO2 22 - 32 mmol/L 21(L) 22 22  Calcium 8.9 - 10.3 mg/dL 7.4(L) 7.3(L) 7.4(L)  Total Protein 6.5 - 8.1 g/dL - - -  Total Bilirubin 0.3 - 1.2 mg/dL - - -  Alkaline Phos 38 - 126 U/L - - -  AST 15 - 41 U/L - - -  ALT 0 - 44 U/L - - -    Imaging: No results found.  Assessment/Plan:   Active Problems:   Acute encephalopathy   Atrial fibrillation with RVR (HCC)   Ulcers of both lower extremities (HCC)   Thyrotoxicosis   Wound infection   Other pancytopenia ([place])   Patient Summary: Candace Robinson a 55 y.o.with a pertinent PMH of CHF, A. fib, hypertension, type 2 diabetes, venous insufficiency with lower extremity ulcers, who presented with altered mental status and lower extremity wounds and admitted for severe thyrotoxicosis.   #Thyrotoxicosis, severe #Pancytopenia - resolved Asymptomatic and hemodynamically stable.  No new concerns this morning.  WBC and platelets within normal limits since decreasing methimazole to 30 mg. -Continue methimazole 30 mg -Continue propranolol twice  daily -Daily CBC -If agranulocytosis is a concern then we will switch back to PTU and consult general surgery for possible thyroidectomy.  Lower extremity ulcers, cellulitis: Wounds healing well.  Please see images above.  Requested nurse page me when dressing change occurs so that her wounds may be reevaluated.  -Assess wounds today -Completed 14 course of doxycycline on 09/14/2018 -Continue pain meds with wound care management -Blood cultures/urine cultures negative  Asymptomatic hyponatremia Patient with intermittent hyponatremia during her admission. Will continue to monitor.  -daily BMP  Normocytic Anemia: Likely iron deficiency anemia.  During hospitalization patient's ferritin dropped from 185 to 62, consistent  with iron deficiency.  Iron of 12.  TIBC 150, saturation ratio of 8.  Suspect SLE possibly masking her anemia.  Total iron deficit of 1294 mg.  We will give 510 Feraheme, continue with oral supplementation after. -Repeat test on outpatient basis to assure resolution of iron deficiency   Diet: Regular diet, thin IVF: None,None VTE: Enoxaparin Code: Full PT/OT recs: SNF TOC recs: None Family Update: Family to discuss patient's case with social work today in terms of placement.   Dispo: Anticipated discharge to with family vs SNF in 2-3 days pending potential placement, Medicaid approval  [place] Internal Medicine Resident PGY-1 Pager [phone] Please contact the on call pager after 5 pm and on weekends at [phone].

## 2020-09-15 DIAGNOSIS — E0501 Thyrotoxicosis with diffuse goiter with thyrotoxic crisis or storm: Secondary | ICD-10-CM

## 2020-09-15 LAB — BASIC METABOLIC PANEL
Anion gap: 5 (ref 5–15)
BUN: 8 mg/dL (ref 6–20)
CO2: 19 mmol/L — ABNORMAL LOW (ref 22–32)
Calcium: 8.3 mg/dL — ABNORMAL LOW (ref 8.9–10.3)
Chloride: 110 mmol/L (ref 98–111)
Creatinine, Ser: 0.6 mg/dL (ref 0.44–1.00)
GFR, Estimated: >60 mL/min (ref >60)
Glucose, Bld: 90 mg/dL (ref 70–99)
Potassium: 4.3 mmol/L (ref 3.5–5.1)
Sodium: 134 mmol/L — ABNORMAL LOW (ref 135–145)

## 2020-09-15 LAB — CBC
HCT: 26.1 % — ABNORMAL LOW (ref 36.0–46.0)
Hemoglobin: 7.8 g/dL — ABNORMAL LOW (ref 12.0–15.0)
MCH: 24.4 pg — ABNORMAL LOW (ref 26.0–34.0)
MCHC: 29.9 g/dL — ABNORMAL LOW (ref 30.0–36.0)
MCV: 81.6 fL (ref 80.0–100.0)
Platelets: 273 10*3/uL (ref 150–400)
RBC: 3.2 MIL/uL — ABNORMAL LOW (ref 3.87–5.11)
RDW: 17.7 % — ABNORMAL HIGH (ref 11.5–15.5)
WBC: 4.2 10*3/uL (ref 4.0–10.5)
nRBC: 0 % (ref 0.0–0.2)

## 2020-09-15 MED ORDER — SODIUM CHLORIDE 0.9 % IV SOLN
125.0000 mg | Freq: Every day | INTRAVENOUS | Status: DC
  Administered 2020-09-15 – 2020-09-17 (×3): 125 mg via INTRAVENOUS
  Filled 2020-09-15 (×4): qty 10

## 2020-09-15 MED ORDER — SODIUM CHLORIDE 0.9 % IV SOLN: 510.0000 mg | Freq: Once | INTRAVENOUS | Status: DC

## 2020-09-15 NOTE — Plan of Care (Signed)

## 2020-09-15 NOTE — Progress Notes (Signed)
Pt refused dressing change. Pt stated she wanted to wait for later in the day since she is not in pain. RN educated pt.

## 2020-09-15 NOTE — Progress Notes (Signed)
HD#15 Subjective:  Overnight Events: None  Candace Robinson states that she is feeling well this morning, without CP, SOB, fevers, chills, or any other symptoms other than bilateral foot pain that she states is a 2/10. Dressings not changed since yesterday. She states social work is currently in the process of getting her set up with Medicaid, although they are waiting on disability going through first.   Objective:  Vital signs in last 24 hours: Vitals:   09/14/20 0943 09/14/20 1521 09/14/20 2005 09/15/20 0430  BP: 103/61 (!) 105/57 (!) 105/51 (!) 118/59  Pulse: 66 65 74 62  Resp: 16 17 16 16   Temp: 98.3 F (36.8 C) 98.4 F (36.9 C) 98.3 F (36.8 C) 98.4 F (36.9 C)  TempSrc: Oral Oral Oral Oral  SpO2: 100% 100% 98% 100%  Weight:      Height:       Supplemental O2: Room Air  Physical Exam:  Constitutional: No acute distress, resting in bed. HENT: normocephalic atraumatic Eyes: conjunctiva non-erythematous Neck: supple Cardiovascular: regular rate and rhythm, 2/6 systolic murmur Pulmonary/Chest: normal work of breathing on room air Abdominal: non-distended MSK: normal bulk and tone Neurological: alert & oriented x 3 Skin: warm and dry, minimal lower extremity edema.  Wounds wrapped.   Psych: Normal mood  Filed Weights   08/31/20 1042  Weight: 84.8 kg     Intake/Output Summary (Last 24 hours) at 09/15/2020 0635 Last data filed at 09/14/2020 1500 Gross per 24 hour  Intake 600 ml  Output 400 ml  Net 200 ml   Net IO Since Admission: -213.91 mL [09/15/20 0635]  Pertinent Labs: CBC Latest Ref Rng & Units 09/15/2020 09/14/2020 09/13/2020  WBC 4.0 - 10.5 K/uL 4.2 4.7 4.4  Hemoglobin 12.0 - 15.0 g/dL 7.8(L) 8.0(L) 8.1(L)  Hematocrit 36.0 - 46.0 % 26.1(L) 26.2(L) 27.0(L)  Platelets 150 - 400 K/uL 273 304 265    CMP Latest Ref Rng & Units 09/15/2020 09/14/2020 09/13/2020  Glucose 70 - 99 mg/dL 90 92 90  BUN 6 - 20 mg/dL 8 6 <5(L)  Creatinine 0.44 - 1.00 mg/dL 0.60 0.44  0.44  Sodium 135 - 145 mmol/L 134(L) 135 133(L)  Potassium 3.5 - 5.1 mmol/L 4.3 4.2 4.1  Chloride 98 - 111 mmol/L 110 110 109  CO2 22 - 32 mmol/L 19(L) 20(L) 21(L)  Calcium 8.9 - 10.3 mg/dL 8.3(L) 7.8(L) 7.4(L)  Total Protein 6.5 - 8.1 g/dL - - -  Total Bilirubin 0.3 - 1.2 mg/dL - - -  Alkaline Phos 38 - 126 U/L - - -  AST 15 - 41 U/L - - -  ALT 0 - 44 U/L - - -    Imaging: No results found.  Assessment/Plan:   Active Problems:   Acute encephalopathy   Atrial fibrillation with RVR (HCC)   Ulcers of both lower extremities (HCC)   Thyrotoxicosis   Wound infection   Other pancytopenia ([place])   Patient Summary: Candace Robinson a 55 y.o.with a pertinent PMH of CHF, A. fib, hypertension, type 2 diabetes, venous insufficiency with lower extremity ulcers, who presented with altered mental status and lower extremity wounds and admitted for severe thyrotoxicosis.  Currently pending disposition of SNF placement.  #Thyrotoxicosis, severe #Pancytopenia - resolved Asymptomatic and hemodynamically stable.  No new concerns this morning.   To be seen platelets in the normal limits. -Continue methimazole 30 mg -Continue propranolol twice daily -Daily CBC -If agranulocytosis is a concern then we will switch back to PTU and  consult general surgery for possible thyroidectomy.  Lower extremity ulcers, cellulitis: Wounds healing well.  Last images from 09/13/2020. -Continue dressing change recommendations of wound care -Completed 14 course of doxycycline on 09/14/2018 -Continue pain meds with wound care management -Blood cultures/urine cultures negative  Asymptomatic hyponatremia Patient with intermittent hyponatremia during her admission. Will continue to monitor.  -daily BMP  Normocytic anemia: Iron deficiency anemia Will order Novacet 125 mg daily x5. -Repeat test on outpatient basis to assure resolution of iron deficiency  Diet: Regular diet, thin IVF: None,None VTE:  Enoxaparin Code: Full PT/OT recs: SNF TOC recs: None   Dispo: Anticipated discharge pending approval of Medicaid and/or acceptance to SNF facility  [place] Internal Medicine Resident PGY-1 Pager [phone] Please contact the on call pager after 5 pm and on weekends at [phone].

## 2020-09-15 NOTE — Progress Notes (Signed)
Physical Therapy Treatment Patient Details Name: Candace Robinson MRN: [id number] DOB: 10-16-1965 Today's Date: 09/15/2020    History of Present Illness The pt is a 55 yo female who presenting 2/28 due to concerns of BLE ulcers and AMS. 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 PMH includes: CHF, afib, HTN, DM II, lupus, heart mumur, and chronic venous insufficiency.    PT Comments    Pt making good progress with transfers.  She was able to perform posterior to anterior transfer with min A of 2 and cues.  Attempted to progress to lateral scoot but unable to tolerate feet in dependent position for longer than 1 minute and wounds began bleeding so feet elevated.  Continue to progress as able.     Follow Up Recommendations  SNF;Supervision/Assistance - 24 hour     Equipment Recommendations  Wheelchair (measurements PT);Wheelchair cushion (measurements PT) (with elevating leg rest)    Recommendations for Other Services       Precautions / Restrictions Precautions Precautions: Fall;Other (comment) Precaution Comments: wounds to BLE    Mobility  Bed Mobility Overal bed mobility: Needs Assistance Bed Mobility: Sit to Supine     Supine to sit: Supervision     General bed mobility comments: long sit to supine: supervision    Transfers Overall transfer level: Needs assistance   Transfers: Anterior-Posterior Transfer       Anterior-Posterior transfers: Min assist;+2 physical assistance   General transfer comment: Attempted lateral scoot but too painful and feet began bleeding from wounds.  Pt was in chair.  Performed posterior to anterrior  transfer back to bed.  Required min A of 2 to assist with scooting hips.  Cued for weight shift and scoot, then switch.  Also, cued for weight shifting when scooting in bed to turn and to move toward [facility].  Ambulation/Gait                 Stairs             Wheelchair Mobility    Modified Rankin (Stroke Patients  Only)       Balance Overall balance assessment: Needs assistance Sitting-balance support: Feet supported;Bilateral upper extremity supported (feet elevated - long sitting position) Sitting balance-Leahy Scale: Good Sitting balance - Comments: pt able to maintain upright trunk during AP transfer.       Standing balance comment: unable                            Cognition Arousal/Alertness: Awake/alert Behavior During Therapy: WFL for tasks assessed/performed Overall Cognitive Status: Within Functional Limits for tasks assessed                                        Exercises General Exercises - Lower Extremity Ankle Circles/Pumps: AROM;Both;10 reps;Supine Quad Sets: AROM;Both;10 reps;Supine Short Arc Quad: AROM;Both;10 reps;Supine Heel Slides: AROM;Both;10 reps;Supine Hip ABduction/ADduction: AROM;Both;10 reps;Supine    General Comments General comments (skin integrity, edema, etc.): Only tolerated bil LE in dependent position for ~ 1 min      Pertinent Vitals/Pain Faces Pain Scale: Hurts whole lot Pain Location: Bil LE when in dependent position; no pain when elevated Pain Descriptors / Indicators: Grimacing;Discomfort Pain Intervention(s): Limited activity within patient's tolerance;Monitored during session;Repositioned    Home Living  Prior Function            PT Goals (current goals can now be found in the care plan section) Acute Rehab PT Goals Patient Stated Goal: to get to chair PT Goal Formulation: With patient Time For Goal Achievement: 09/17/20 Potential to Achieve Goals: Good Progress towards PT goals: Progressing toward goals    Frequency    Min 2X/week      PT Plan Current plan remains appropriate    Co-evaluation              AM-PAC PT "6 Clicks" Mobility   Outcome Measure  Help needed turning from your back to your side while in a flat bed without using bedrails?: A  Little Help needed moving from lying on your back to sitting on the side of a flat bed without using bedrails?: A Little Help needed moving to and from a bed to a chair (including a wheelchair)?: A Lot (anterior posterior transfers) Help needed standing up from a chair using your arms (e.g., wheelchair or bedside chair)?: Total Help needed to walk in hospital room?: Total Help needed climbing 3-5 steps with a railing? : Total 6 Click Score: 11    End of Session Equipment Utilized During Treatment: Gait belt Activity Tolerance: Patient tolerated treatment well Patient left: with call bell/phone within reach;in chair Nurse Communication: Mobility status PT Visit Diagnosis: Other abnormalities of gait and mobility (R26.89);Muscle weakness (generalized) (M62.81)     Time: 9798-9211 PT Time Calculation (min) (ACUTE ONLY): 28 min  Charges:  $Therapeutic Exercise: 8-22 mins $Therapeutic Activity: 8-22 mins                     Abran Richard, PT Acute Rehab Services Pager [phone] [facility] 09/15/2020, 4:10 PM

## 2020-09-15 NOTE — Plan of Care (Signed)
  Problem: Clinical Measurements: Goal: Ability to maintain clinical measurements within normal limits will improve Outcome: Progressing Goal: Will remain free from infection Outcome: Progressing   

## 2020-09-16 DIAGNOSIS — E0581 Other thyrotoxicosis with thyrotoxic crisis or storm: Secondary | ICD-10-CM

## 2020-09-16 DIAGNOSIS — D509 Iron deficiency anemia, unspecified: Secondary | ICD-10-CM

## 2020-09-16 DIAGNOSIS — L03116 Cellulitis of left lower limb: Secondary | ICD-10-CM

## 2020-09-16 LAB — CBC
HCT: 28 % — ABNORMAL LOW (ref 36.0–46.0)
Hemoglobin: 8.3 g/dL — ABNORMAL LOW (ref 12.0–15.0)
MCH: 24.3 pg — ABNORMAL LOW (ref 26.0–34.0)
MCHC: 29.6 g/dL — ABNORMAL LOW (ref 30.0–36.0)
MCV: 81.9 fL (ref 80.0–100.0)
Platelets: 290 10*3/uL (ref 150–400)
RBC: 3.42 MIL/uL — ABNORMAL LOW (ref 3.87–5.11)
RDW: 18.1 % — ABNORMAL HIGH (ref 11.5–15.5)
WBC: 4 10*3/uL (ref 4.0–10.5)
nRBC: 0 % (ref 0.0–0.2)

## 2020-09-16 LAB — BASIC METABOLIC PANEL
Anion gap: 6 (ref 5–15)
BUN: 7 mg/dL (ref 6–20)
CO2: 20 mmol/L — ABNORMAL LOW (ref 22–32)
Calcium: 8.9 mg/dL (ref 8.9–10.3)
Chloride: 108 mmol/L (ref 98–111)
Creatinine, Ser: 0.48 mg/dL (ref 0.44–1.00)
GFR, Estimated: >60 mL/min (ref >60)
Glucose, Bld: 86 mg/dL (ref 70–99)
Potassium: 4.4 mmol/L (ref 3.5–5.1)
Sodium: 134 mmol/L — ABNORMAL LOW (ref 135–145)

## 2020-09-16 NOTE — TOC Initial Note (Signed)
Transition of Care [facility]) - Initial/Assessment Note    Patient Details  Name: Candace Robinson MRN: [id number] Date of Birth: [birth year]  Transition [facility]) CM/SW Contact:    Sharin Mons, RN Phone Number: 09/16/2020, 12:39 PM  Clinical Narrative:    Admitted with of BLE ulcers and AMS. Hx of   CHF, afib, HTN, DM II, lupus, heart mumur, and chronic venous insufficiency. PTA lived with sister Candace Robinson, short term. Supportive sister, works FT, M-F 8am-5pm. NCM received consult for possible SNF placement at time of discharge. NCM spoke with patient regarding PT recommendation of SNF placement at time of discharge. Patient reported that she    is currently unable to care for self independently  given her current physical needs and fall risk. Patient expressed understanding of PT recommendation and is agreeable to SNF placement at time of discharge. Patient without preference for  SNF. Pt without health insurance,  Income limited, jobless. Pt granted  LOG for SNF placement from [facility] department. Patient expressed being hopeful for rehab and to feel better soon. No further questions reported at this time. RNCM to continue to follow and assist with discharge planning needs.  Pt fully Covid vaccinated and boosted.  Expected Discharge Plan: Skilled Nursing Facility Barriers to Discharge: No SNF bed   Patient Goals and CMS Choice Patient states their goals for this hospitalization and ongoing recovery are:: to get better CMS Medicare.gov Compare Post Acute Care list provided to:: Patient Choice offered to / list presented to : Patient  Expected Discharge Plan and Services Expected Discharge Plan: [place] arrangements for the past 2 months: Single Family Home                                      Prior Living Arrangements/Services Living arrangements for the past 2 months: Single Family Home Lives with:: Siblings Patient language and need for  interpreter reviewed:: Yes Do you feel safe going back to the place where you live?: Yes      Need for Family Participation in Patient Care: Yes (Comment) Care giver support system in place?: Yes (comment) Current home services: DME Criminal Activity/Legal Involvement Pertinent to Current Situation/Hospitalization: No - Comment as needed  Activities of Daily Living   ADL Screening (condition at time of admission) Is the patient deaf or have difficulty hearing?: No Does the patient have difficulty seeing, even when wearing glasses/contacts?: No Does the patient have difficulty concentrating, remembering, or making decisions?: No Does the patient have difficulty dressing or bathing?: Yes Does the patient have difficulty walking or climbing stairs?: Yes  Permission Sought/Granted Permission sought to share information with : Case Manager Permission granted to share information with : Yes, Verbal Permission Granted     Permission granted to share info w AGENCY: SNF  Permission granted to share info w Relationship: Sister- Candace Robinson     Emotional Assessment Appearance:: Appears stated age Attitude/Demeanor/Rapport: Engaged Affect (typically observed): Appropriate Orientation: : Oriented to Self Alcohol / Substance Use: Not Applicable Psych Involvement: No (comment)  Admission diagnosis:  Encephalopathy [G93.40] Wound infection [T14.8XXA, L08.9] Acute encephalopathy [G93.40] Atrial fibrillation with RVR (HCC) [I48.91] Ulcers of both lower extremities ([place]) [L97.919, L97.929] Sepsis ([place]) [A41.9] Patient Active Problem List   Diagnosis Date Noted  . Wound infection   . Other pancytopenia ([place])   . Thyrotoxicosis 09/01/2020  . Atrial fibrillation  with RVR ([place])   . Ulcers of both lower extremities ([place])   . Acute encephalopathy 08/31/2020   PCP:  Patient, No Pcp Per Pharmacy:   [place], [place] [address] [place] [address] Phone:  [phone] Fax: [phone]  [id number] - [place], [place] - [address] [place] [address] Phone: [phone] Fax: [phone]     Social Determinants of Health (SDOH) Interventions    Readmission Risk Interventions No flowsheet data found.

## 2020-09-16 NOTE — Progress Notes (Addendum)
HD#16 Subjective:  Overnight Events: No acute events  Candace Robinson states her wounds on her feet are feeling better  - they only bother her when open to room air. She denies any CP, SOB, palpitations, diarrhea, or constipation. Continues to eat and drink well.  Has no acute concerns at this time.  Objective:  Vital signs in last 24 hours: Vitals:   09/15/20 0812 09/15/20 1420 09/15/20 2031 09/16/20 0528  BP: (!) 110/56 (!) 107/59 (!) 117/55 113/64  Pulse: 62 65 74 62  Resp: 18 20 20 16   Temp: 98.2 F (36.8 C) 98.1 F (36.7 C) 97.7 F (36.5 C) 98.3 F (36.8 C)  TempSrc: Oral Oral Oral Oral  SpO2: 100% 100% 100% 100%  Weight:      Height:       Supplemental O2: Room Air  Physical Exam:  Constitutional: No acute distress, resting in bed. HENT: normocephalic atraumatic Eyes: conjunctiva non-erythematous Neck: supple Cardiovascular: regular rate and rhythm, 2/6 systolic murmur  Pulmonary/Chest: normal work of breathing on room air Abdominal: non-distended MSK: normal bulk and tone Neurological: alert & oriented x 3 Skin: warm and dry, minimal lower extremity edema.  Wounds wrapped.   Psych: Normal mood  Filed Weights   08/31/20 1042  Weight: 84.8 kg     Intake/Output Summary (Last 24 hours) at 09/16/2020 0557 Last data filed at 09/15/2020 1512 Gross per 24 hour  Intake 350 ml  Output 1800 ml  Net -1450 ml   Net IO Since Admission: -1,663.91 mL [09/16/20 0557]  Pertinent Labs: CBC Latest Ref Rng & Units 09/16/2020 09/15/2020 09/14/2020  WBC 4.0 - 10.5 K/uL 4.0 4.2 4.7  Hemoglobin 12.0 - 15.0 g/dL 8.3(L) 7.8(L) 8.0(L)  Hematocrit 36.0 - 46.0 % 28.0(L) 26.1(L) 26.2(L)  Platelets 150 - 400 K/uL 290 273 304    CMP Latest Ref Rng & Units 09/16/2020 09/15/2020 09/14/2020  Glucose 70 - 99 mg/dL 86 90 92  BUN 6 - 20 mg/dL 7 8 6   Creatinine 0.44 - 1.00 mg/dL 0.48 0.60 0.44  Sodium 135 - 145 mmol/L 134(L) 134(L) 135  Potassium 3.5 - 5.1 mmol/L 4.4 4.3 4.2  Chloride 98  - 111 mmol/L 108 110 110  CO2 22 - 32 mmol/L 20(L) 19(L) 20(L)  Calcium 8.9 - 10.3 mg/dL 8.9 8.3(L) 7.8(L)  Total Protein 6.5 - 8.1 g/dL - - -  Total Bilirubin 0.3 - 1.2 mg/dL - - -  Alkaline Phos 38 - 126 U/L - - -  AST 15 - 41 U/L - - -  ALT 0 - 44 U/L - - -    Imaging: No results found.  Assessment/Plan:   Principal Problem:   Thyrotoxicosis Active Problems:   Acute encephalopathy   Atrial fibrillation with RVR (HCC)   Ulcers of both lower extremities (HCC)   Wound infection   Other pancytopenia ([place])   Patient Summary: Candace Robinson is a 55 y.o. with a pertinent PMH of CHF, A. fib, hypertension, type 2 diabetes, venous insufficiency with lower extremity ulcers, who presented with altered mental status and lower extremity wounds and admitted for severe thyrotoxicosis.  Currently pending disposition of SNF placement.  #Thyrotoxicosis, severe Asymptomatic and hemodynamically stable.  No new concerns this morning. Tolerating medications well -Continue methimazole 30 mg -Continue propranolol twice daily -Daily CBC -Repeat thyroid tests end of the month   Lower extremity ulcers, cellulitis: Wounds healing well.  Pain is well controlled. -Continue dressing change recommendations of wound care -Completed 14 course of  doxycycline on 09/14/2018 -Continue pain meds with wound care management -Blood cultures/urine cultures negative   Asymptomatic hyponatremia Patient with intermittent hyponatremia during her admission.  Possibly secondary to the opioid she has been receiving during her admission.  Hyperthyroidism could also be contributing.  Will continue to monitor.  -daily BMP   Normocytic anemia: Iron deficiency anemia Will order Novacet 125 mg daily x5. -Repeat test on outpatient basis to assure resolution of iron deficiency   Diet: Regular diet, thin IVF: None,None VTE: Enoxaparin Code: Full PT/OT recs: SNF TOC recs: None  Dispo: Anticipated discharge pending  approval of Medicaid and/or acceptance to SNF facility  [place] Internal Medicine Resident PGY-1 Pager [phone] Please contact the on call pager after 5 pm and on weekends at [phone].

## 2020-09-16 NOTE — Plan of Care (Signed)

## 2020-09-16 NOTE — Plan of Care (Signed)

## 2020-09-16 NOTE — Progress Notes (Signed)
Occupational Therapy Treatment Patient Details Name: Candace Robinson MRN: [id number] DOB: 12/13/1965 Today's Date: 09/16/2020    History of present illness The pt is a 55 yo female who presenting 2/28 due to concerns of BLE ulcers and AMS. 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 PMH includes: CHF, afib, HTN, DM II, lupus, heart mumur, and chronic venous insufficiency.   OT comments  Pt making good progress with functional goals. OT will continue to follow acutely to maximize level of function and safety  Follow Up Recommendations  SNF    Equipment Recommendations  Other (comment) (TBD at SNF)    Recommendations for Other Services      Precautions / Restrictions Precautions Precautions: Fall;Other (comment) Precaution Comments: wounds to BLE Restrictions Weight Bearing Restrictions: No       Mobility Bed Mobility Overal bed mobility: Needs Assistance Bed Mobility: Sit to Supine     Supine to sit: Supervision     General bed mobility comments: long sit to supine in prep for AP transfer, increased time and effort due to fatigue    Transfers Overall transfer level: Needs assistance Equipment used: None Transfers: Comptroller transfers: Mod assist   General transfer comment: pt required increased time and effort due to fatigue, mod A with pads to scoot back onto recliner    Balance Overall balance assessment: Needs assistance Sitting-balance support: Feet supported;Bilateral upper extremity supported Sitting balance-Leahy Scale: Good Sitting balance - Comments: pt able to maintain upright trunk during AP transfer.       Standing balance comment: unable                           ADL either performed or assessed with clinical judgement   ADL Overall ADL's : Needs assistance/impaired     Grooming: Wash/dry hands;Wash/dry face;Oral care;Sitting           Upper Body Dressing : Min  guard;Sitting Upper Body Dressing Details (indicate cue type and reason): long sitting in bed     Toilet Transfer: Moderate assistance Toilet Transfer Details (indicate cue type and reason): simulated AP transfer to recliner         Functional mobility during ADLs: Moderate assistance General ADL Comments: pt able to mobilize OOB to recliner via AP transfer mod A,  pt continues to report increased pain in BLEs     Vision Patient Visual Report: No change from baseline     Perception     Praxis      Cognition Arousal/Alertness: Awake/alert Behavior During Therapy: WFL for tasks assessed/performed Overall Cognitive Status: Within Functional Limits for tasks assessed                                          Exercises     Shoulder Instructions       General Comments      Pertinent Vitals/ Pain       Pain Assessment: Faces Pain Score: 7  Pain Location: Bil LE when in dependent position; no pain when elevated Pain Intervention(s): Monitored during session;Repositioned  Home Living                                          Prior  Functioning/Environment              Frequency  Min 2X/week        Progress Toward Goals  OT Goals(current goals can now be found in the care plan section)  Progress towards OT goals: Progressing toward goals     Plan Discharge plan remains appropriate;Frequency remains appropriate    Co-evaluation                 AM-PAC OT "6 Clicks" Daily Activity     Outcome Measure   Help from another person eating meals?: None Help from another person taking care of personal grooming?: A Little Help from another person toileting, which includes using toliet, bedpan, or urinal?: A Lot Help from another person bathing (including washing, rinsing, drying)?: A Lot Help from another person to put on and taking off regular upper body clothing?: A Little Help from another person to put on and taking  off regular lower body clothing?: Total 6 Click Score: 15    End of Session    OT Visit Diagnosis: Unsteadiness on feet (R26.81);Other abnormalities of gait and mobility (R26.89);Muscle weakness (generalized) (M62.81);Pain Pain - part of body: Leg (B LEs)   Activity Tolerance Patient tolerated treatment well;Patient limited by fatigue   Patient Left in chair;with call bell/phone within reach;with chair alarm set   Nurse Communication Mobility status        Time: 1010-1042 OT Time Calculation (min): 32 min  Charges: OT General Charges $OT Visit: 1 Visit OT Treatments $Self Care/Home Management : 8-22 mins $Therapeutic Activity: 8-22 mins     Britt Bottom 09/16/2020, 2:25 PM

## 2020-09-16 NOTE — Plan of Care (Signed)
  Problem: Education: Goal: Knowledge of General Education information will improve Description: Including pain rating scale, medication(s)/side effects and non-pharmacologic comfort measures Outcome: Progressing   Problem: Clinical Measurements: Goal: Will remain free from infection Outcome: Progressing   Problem: Activity: Goal: Risk for activity intolerance will decrease Outcome: Progressing   

## 2020-09-17 LAB — CBC
HCT: 25 % — ABNORMAL LOW (ref 36.0–46.0)
Hemoglobin: 7.8 g/dL — ABNORMAL LOW (ref 12.0–15.0)
MCH: 24.8 pg — ABNORMAL LOW (ref 26.0–34.0)
MCHC: 31.2 g/dL (ref 30.0–36.0)
MCV: 79.4 fL — ABNORMAL LOW (ref 80.0–100.0)
Platelets: 285 10*3/uL (ref 150–400)
RBC: 3.15 MIL/uL — ABNORMAL LOW (ref 3.87–5.11)
RDW: 18.2 % — ABNORMAL HIGH (ref 11.5–15.5)
WBC: 4.2 10*3/uL (ref 4.0–10.5)
nRBC: 0 % (ref 0.0–0.2)

## 2020-09-17 LAB — BASIC METABOLIC PANEL
Anion gap: 5 (ref 5–15)
BUN: 7 mg/dL (ref 6–20)
CO2: 20 mmol/L — ABNORMAL LOW (ref 22–32)
Calcium: 9.2 mg/dL (ref 8.9–10.3)
Chloride: 108 mmol/L (ref 98–111)
Creatinine, Ser: 0.45 mg/dL (ref 0.44–1.00)
GFR, Estimated: >60 mL/min (ref >60)
Glucose, Bld: 92 mg/dL (ref 70–99)
Potassium: 4.2 mmol/L (ref 3.5–5.1)
Sodium: 133 mmol/L — ABNORMAL LOW (ref 135–145)

## 2020-09-17 LAB — RESP PANEL BY RT-PCR (FLU A&B, COVID) ARPGX2
Influenza A by PCR: NEGATIVE
Influenza B by PCR: NEGATIVE
SARS Coronavirus 2 by RT PCR: NEGATIVE

## 2020-09-17 MED ORDER — METHIMAZOLE 10 MG PO TABS: 30.0000 mg | ORAL_TABLET | Freq: Every day | ORAL | 0 refills | Status: DC

## 2020-09-17 MED ORDER — ZINC OXIDE 12.8 % EX OINT: TOPICAL_OINTMENT | Freq: Two times a day (BID) | CUTANEOUS | 0 refills | Status: DC

## 2020-09-17 MED ORDER — SILVER SULFADIAZINE 1 % EX CREA: TOPICAL_CREAM | Freq: Two times a day (BID) | CUTANEOUS | 0 refills | Status: DC

## 2020-09-17 MED ORDER — PROPRANOLOL HCL 40 MG PO TABS: 40.0000 mg | ORAL_TABLET | Freq: Two times a day (BID) | ORAL | 0 refills | Status: DC

## 2020-09-17 MED ORDER — OXYCODONE HCL 5 MG PO TABS: 5.0000 mg | ORAL_TABLET | ORAL | 0 refills | Status: DC | PRN

## 2020-09-17 MED ORDER — ACETAMINOPHEN 325 MG PO TABS: 650.0000 mg | ORAL_TABLET | Freq: Four times a day (QID) | ORAL | 0 refills | Status: DC

## 2020-09-17 NOTE — Discharge Instructions (Signed)
Ms. Hank, thank you for allowing [place] to care for you during her hospitalization.  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  A severe and dangerous disease caused by high thyroid hormone levels.  This was corrected using IV medications and you will be continued on these at discharge.  This medication is called methimazole, if you ever are close to running out of this medication please call your primary care doctor.  You completed a 14-day course of antibiotics for your lower extremity wounds.  We will be continuing your dressing changes while you are in the skilled nursing facility.  The dressing instructions are in your discharge packet as well as the names of the medicines that they should be applying.  If they have any questions about any of this please have them call the hospital.  We did discontinue your home diltiazem, and put you on propranolol.  We have also ordered you oxycodone to take with dressing changes.  We are only allowed to write you for 5 days of this, so if you need additional pain medication during wound changes please asked the facility to write a new prescription.  Please follow-up with the endocrinologist that is listed in the chart.  Please call and make an appointment as soon as you can.  Also please follow-up with your primary care provider in 1 week.  If you have any other questions please call the hospital and asked to speak with the internal medicine residency service and ask for me.  Thank you again for letting me care for you, Dr. Sanjuana Letters DO

## 2020-09-17 NOTE — Plan of Care (Signed)
  Problem: Education: Goal: Knowledge of General Education information will improve Description: Including pain rating scale, medication(s)/side effects and non-pharmacologic comfort measures Outcome: Adequate for Discharge   Problem: Clinical Measurements: Goal: Ability to maintain clinical measurements within normal limits will improve Outcome: Adequate for Discharge Goal: Diagnostic test results will improve Outcome: Adequate for Discharge   Problem: Activity: Goal: Risk for activity intolerance will decrease Outcome: Adequate for Discharge

## 2020-09-17 NOTE — Progress Notes (Signed)
Pt report given to RN [facility]. Pt to go to Rm [id number] as per case manager notes

## 2020-09-17 NOTE — TOC Transition Note (Signed)
Transition of Care [facility]) - CM/SW Discharge Note   Patient Details  Name: Candace Robinson MRN: [id number] Date of Birth: 1965-12-25  Transition of Care [facility]) CM/SW Contact:  Sharin Mons, RN Phone Number: 09/17/2020, 10:46 AM   Clinical Narrative:    Patient will DC to: [facility] Anticipated DC date: 09/17/2020 Family notified: yes , [name] Transport by: Corey Harold  Admitted with severe thyrotoxicosis. Hx of CHF, A. fib, hypertension, type 2 diabetes, venous insufficiency with lower extremity ulcers. Per MD patient ready for DC today . RN, patient, patient's family, and facility notified of DC. Discharge Summary and FL2 sent to facility. RN to call report prior to discharge [phone]). Rm # [id number] - W .DC packet on chart. Ambulance transport requested for patient.   RNCM will sign off for now as intervention is no longer needed. Please consult [place] again if new needs arise.    Final next level of care: [place] SNF) Barriers to Discharge: No Barriers Identified   Patient Goals and CMS Choice Patient states their goals for this hospitalization and ongoing recovery are:: to get better CMS Medicare.gov Compare Post Acute Care list provided to:: Patient Choice offered to / list presented to : Patient  Discharge Placement                       Discharge Plan and Services                                     Social Determinants of Health (SDOH) Interventions     Readmission Risk Interventions No flowsheet data found.

## 2020-09-18 ENCOUNTER — Inpatient Hospital Stay
Admission: RE | Admit: 2020-09-18 | Discharge: 2020-10-17 | Disposition: A | Discharge status: Home / Self Care | Payer: Self-pay | Source: Ambulatory Visit | Attending: Internal Medicine | Admitting: Internal Medicine

## 2020-09-18 ENCOUNTER — Encounter: Payer: Self-pay | Admitting: Adult Health

## 2020-09-18 ENCOUNTER — Non-Acute Institutional Stay (SKILLED_NURSING_FACILITY): Payer: Self-pay | Admitting: Adult Health

## 2020-09-18 DIAGNOSIS — E538 Deficiency of other specified B group vitamins: Secondary | ICD-10-CM

## 2020-09-18 DIAGNOSIS — E0581 Other thyrotoxicosis with thyrotoxic crisis or storm: Secondary | ICD-10-CM

## 2020-09-18 DIAGNOSIS — I152 Hypertension secondary to endocrine disorders: Secondary | ICD-10-CM

## 2020-09-18 DIAGNOSIS — M329 Systemic lupus erythematosus, unspecified: Secondary | ICD-10-CM | POA: Insufficient documentation

## 2020-09-18 DIAGNOSIS — D508 Other iron deficiency anemias: Secondary | ICD-10-CM | POA: Insufficient documentation

## 2020-09-18 DIAGNOSIS — K219 Gastro-esophageal reflux disease without esophagitis: Secondary | ICD-10-CM | POA: Insufficient documentation

## 2020-09-18 DIAGNOSIS — L97921 Non-pressure chronic ulcer of unspecified part of left lower leg limited to breakdown of skin: Secondary | ICD-10-CM

## 2020-09-18 DIAGNOSIS — E559 Vitamin D deficiency, unspecified: Secondary | ICD-10-CM

## 2020-09-18 DIAGNOSIS — E1159 Type 2 diabetes mellitus with other circulatory complications: Secondary | ICD-10-CM

## 2020-09-18 DIAGNOSIS — I4891 Unspecified atrial fibrillation: Secondary | ICD-10-CM

## 2020-09-18 DIAGNOSIS — I700 Atherosclerosis of aorta: Secondary | ICD-10-CM | POA: Insufficient documentation

## 2020-09-18 DIAGNOSIS — I5032 Chronic diastolic (congestive) heart failure: Secondary | ICD-10-CM

## 2020-09-18 DIAGNOSIS — D649 Anemia, unspecified: Secondary | ICD-10-CM

## 2020-09-18 DIAGNOSIS — L97911 Non-pressure chronic ulcer of unspecified part of right lower leg limited to breakdown of skin: Secondary | ICD-10-CM

## 2020-09-18 DIAGNOSIS — D61818 Other pancytopenia: Secondary | ICD-10-CM

## 2020-09-18 NOTE — Progress Notes (Signed)
Pt picked up by EMS. All belongings returned to pt and sent with EMS. AVS/discharge education was done by day shift RN. Pt v/s stable. Pt transported via stretcher with no issues

## 2020-09-18 NOTE — Progress Notes (Signed)
Location:  [facility] Room Number: 116/W Place of Service:  SNF (31)   CODE STATUS: Full Code  Allergies  Allergen Reactions  . Motrin [Ibuprofen]     Unknown    Chief Complaint  Patient presents with  . Hospitalization Follow-up    Hospitalization Follow Up     HPI:  She is a 55 year old woman who has been hospitalized from 08-31-20 through 09-17-20. She has a medical history which includes: diabetes; heart failure; lupus; hyperthyroidism. She presented to the ED due to her lower extremity ulcerations and confused. In November she was diagnosed with thyrotoxicosis. She was not taking her tapazole.she will need this to be monitored.  She was found to be in afib in December; was not placed on anticoagulation therapy was placed on inderal. She was treated with a 14 day coarse of doxycyline for her bilateral leg ulcerations. The wounds will be followed by the wound nurse at this facility. She was started on IV iron supplement and will be continued on po supplementation. She is here for short term rehab and wound management; with her goal to return back home. She has significant pain in both feet especially with wound care. She denies any constipation; no reports of changes in appetite; no insomnia. She will continue to be followed for her chronic illnesses including: Other pancytopenia:  Other thyrotoxicosis with thyrotoxic crisis or storm:  GERD without esophagitis:   Past Medical History:  Diagnosis Date  . Anemia   . Atrial fibrillation ([place])   . CHF (congestive heart failure) ([place])   . Diabetes mellitus without complication ([place])   . Heart murmur   . Hypertension   . Lupus ([place])   . Thyroid disease    hyperthyroid    History reviewed. No pertinent surgical history.  Social History   Socioeconomic History  . Marital status: Single    Spouse name: Not on file  . Number of children: Not on file  . Years of education: Not on file  . Highest education  level: Not on file  Occupational History  . Not on file  Tobacco Use  . Smoking status: Never Smoker  . Smokeless tobacco: Never Used  Vaping Use  . Vaping Use: Never used  Substance and Sexual Activity  . Alcohol use: Not Currently  . Drug use: Not Currently  . Sexual activity: Not on file  Other Topics Concern  . Not on file  Social History Narrative  . Not on file   Social Determinants of Health   Financial Resource Strain: Not on file  Food Insecurity: Not on file  Transportation Needs: Not on file  Physical Activity: Not on file  Stress: Not on file  Social Connections: Not on file  Intimate Partner Violence: Not on file   Family History  Problem Relation Age of Onset  . Hypertension Mother       VITAL SIGNS BP 116/76   Pulse (!) 59   Temp 98.4 F (36.9 C)   Resp 18   Ht '5\' 3"'  (1.6 m)   Wt 187 lb (84.8 kg)   SpO2 98%   BMI 33.13 kg/m   Outpatient Encounter Medications as of 09/18/2020  Medication Sig  . acetaminophen (TYLENOL) 325 MG tablet Take 2 tablets (650 mg total) by mouth every 6 (six) hours.  . ASPIRIN LOW DOSE 81 MG EC tablet Take 81 mg by mouth daily.  . ferrous sulfate 325 (65 FE) MG tablet Take 325 mg  by mouth in the morning and at bedtime.  . methimazole (TAPAZOLE) 10 MG tablet Take 3 tablets (30 mg total) by mouth daily.  . NON FORMULARY Diet NAS  . omeprazole (PRILOSEC) 40 MG capsule Take 40 mg by mouth daily.  Marland Kitchen oxyCODONE (OXY IR/ROXICODONE) 5 MG immediate release tablet Take 1 tablet (5 mg total) by mouth every 4 (four) hours as needed for moderate pain or severe pain. Use only prior to wound care changes  . propranolol (INDERAL) 40 MG tablet Take 1 tablet (40 mg total) by mouth 2 (two) times daily.  . silver sulfADIAZINE (SILVADENE) 1 % cream Apply topically 2 (two) times daily.  Marland Kitchen zinc gluconate 50 MG tablet Take 50 mg by mouth daily.      SIGNIFICANT DIAGNOSTIC EXAMS  TODAY  08-31-20: chest x-ray: Enlargement of cardiac  silhouette.  No acute infiltrate. Aortic Atherosclerosis   08-31-20: ct of head: Brain:  No evidence of acute infarction, hemorrhage, hydrocephalus,extra-axial collection or mass lesion/mass effect. Empty sella likely incidental and unchanged.  09-01-20: thyroid ultrasound: Stable thyromegaly without suspicious nodules   09-01-20: 2-d echo:   Abnormal septal motion . Left ventricular ejection fraction, by  estimation, is 50 to 55%. The left ventricle has low normal function. The  left ventricle has no regional wall motion abnormalities. Left ventricular  diastolic parameters are  indeterminate.   09-02-20: right foot MRI Healed fracture deformity with periosteal reaction of the fifth metatarsal base. No definite evidence of osteomyeliti  Extensor digitorum tenosynovitis at the fifth metatarsal Superficial area of ulceration along the dorsum of the midfoot with a small 1 cm superficial fluid collection which could represent phlegmon/early abscess.  09-03-20: left foot MRI Findings suggestive of cellulitis with areas of skin induration seen overlying the digits and midfoot. No loculated fluid collections. Probable reactive marrow of the second and third proximal phalanges. No definite osteomyelitis   TODAY  08-31-20: wbc 5.9; hgb 8.0; hct 27.6; mcv 83.4 plt 191; glucose 116; bun 11; creat 0.60; k+ 3.9; na++ 139; ca 7.9; GFR>60; ast 51; albumin 2.8; BNP 1098.4; free T4 2.53; tsh <0.010; mag 1.5 hgb a1c 5.6  09-01-20: iron 12 tibc 150; ferritin 185; folate 4.5; vit B 12: 449; chol 77; ldl 47; trig 52; hdl 20; urine culture: no growth 09-02-20: vit D: 17.02; mag 1.7 09-06-20: wbc 2.7; hgb 7.5; hct 25.1; mcv 80.7 plt 107;glcuose 101; bun 7.8; creat 0.42; k+ 4.4; na++ 134; ca 9.1 GFR>60; ast 49; alk phos 135; albumin 2.2 09-17-20: wbc 4.2; hgb 7.8; hct 25.0; mcv 79.4 plt 285; glucose 92; bun 7; creat 0.45; k+ 4.2; na++ 133; ca 9.2 GFR>60   Review of Systems  Constitutional: Negative for malaise/fatigue.   Respiratory: Negative for cough and shortness of breath.   Cardiovascular: Negative for chest pain, palpitations and leg swelling.  Gastrointestinal: Negative for abdominal pain, constipation and heartburn.  Musculoskeletal: Positive for myalgias. Negative for back pain and joint pain.       Bilateral leg pain   Skin:       Ulcerations on both feet   Neurological: Negative for dizziness.  Psychiatric/Behavioral: The patient is not nervous/anxious.     Physical Exam Constitutional:      General: She is not in acute distress.    Appearance: She is well-developed and overweight. She is not diaphoretic.  Neck:     Thyroid: Thyromegaly present.  Cardiovascular:     Rate and Rhythm: Normal rate and regular rhythm.     Pulses:  Normal pulses.     Heart sounds: Murmur heard.    Pulmonary:     Effort: Pulmonary effort is normal. No respiratory distress.     Breath sounds: Normal breath sounds.  Abdominal:     General: Bowel sounds are normal. There is no distension.     Palpations: Abdomen is soft.     Tenderness: There is no abdominal tenderness.  Musculoskeletal:     Right lower leg: No edema.     Left lower leg: No edema.     Comments: Is able to move all extremities.  Bilateral lower extremities: are hard to touch; with bilateral lower extremity venous insufficiency changes.   Lymphadenopathy:     Cervical: No cervical adenopathy.  Skin:    General: Skin is warm and dry.     Comments: Venous insufficiency ulcerations  Left extremity:  Lateral ankle: 4 areas total 8 x 6 cm Dorsa foot 3 areas total 10 x 8 cm  dorsal great toe 4 x 2 cm Second dorsal toe: 3 x 2 cm Right extremity:  Medial ankle: 3.6 x 3 x 0.2 cm with slough present Dorsal great toe: 4.2 x 1.6 cm  Second toe: 3 x 1.6 cm Third toe: 2 x 1.4 cm Callus plantar foot 1 x 1 cm necrotic   Neurological:     Mental Status: She is alert and oriented to person, place, and time.  Psychiatric:        Mood and Affect:  Mood normal.       ASSESSMENT/ PLAN:  TODAY  1. Ulcers of both lower extremities limited to breakdown of skin: she is presently stable has completed a coarse of doxycycline. Will continue oxycodone 5 mg every 4 hours as needed and daily prior to wound care. Will continue tylenol 650 mg every 6 hours. Will continue wound care as directed.   2. Iron deficiency anemia due to dietary causes/chronic anemia: is stable hgb 7.8 will continue iron twice daily and will monitor hgb.   3. Other pancytopenia: is stable is on tapazole wbc 4.2 low as 2.7; plt 285 low as 107 will monitor  4. Other thyrotoxicosis with thyrotoxic crisis or storm: is presently without change will continue tapazole 30 mg daily inderal 40 mg twice daily   5. GERD without esophagitis: is stable will continue prilosec 40 mg daily   6. Lupus: is without change will monitor   7. Controlled type 2 diabetes mellitus with other circulatory complication without long term current use of insulin: hgb a1c 5.6. she is presently being managed by diet. She has lost 100 pounds over the past year and has gotten off medications.   8. Chronic diastolic heart failure: is stable EF 50-55% (09-01-20) is presently compensated will continue asa 81 mg daily  9. Atrial fibrillation with RVR: heart rate is stable will continue asa 81 mg daily inderal 40 mg twice daily for rate control; afib is due to thyrotoxicosis will monitor   10. Aortic atherosclerosis: (cxr 08-31-20) will monitor   11. Vitamin D deficiency: is without change level is 17.02 will begin vit D 50,000 units weekly   12. Folate deficiency is without change level is 4.5 will be folic acid 1 mg daily  13. Hypertension associated with type 2 diabetes mellitus: is stable b/p 116/67 is currently off medications will continue to monitor her status.   14. Severe protein calorie malnutrition: is without change albumin 2.1 will begin prostat 30 cc three times daily   Will check  cbc    Time spent with patient 45 minutes: >50% spent with counseling and coordination of care: medical history; goals of care; expectations of therapy; goals with pt/ot.        Ok Edwards NP [facility]  Contact [phone] Monday through Friday 8am- 5pm  After hours call [phone]

## 2020-09-21 ENCOUNTER — Other Ambulatory Visit (HOSPITAL_COMMUNITY)
Admission: RE | Admit: 2020-09-21 | Discharge: 2020-09-21 | Disposition: A | Discharge status: Home / Self Care | Payer: MEDICAID | Source: Skilled Nursing Facility | Attending: Adult Health | Admitting: Adult Health

## 2020-09-21 DIAGNOSIS — I110 Hypertensive heart disease with heart failure: Secondary | ICD-10-CM | POA: Insufficient documentation

## 2020-09-21 LAB — CBC
HCT: 25.9 % — ABNORMAL LOW (ref 36.0–46.0)
Hemoglobin: 7.7 g/dL — ABNORMAL LOW (ref 12.0–15.0)
MCH: 24.8 pg — ABNORMAL LOW (ref 26.0–34.0)
MCHC: 29.7 g/dL — ABNORMAL LOW (ref 30.0–36.0)
MCV: 83.3 fL (ref 80.0–100.0)
Platelets: 208 10*3/uL (ref 150–400)
RBC: 3.11 MIL/uL — ABNORMAL LOW (ref 3.87–5.11)
RDW: 19.9 % — ABNORMAL HIGH (ref 11.5–15.5)
WBC: 3.2 10*3/uL — ABNORMAL LOW (ref 4.0–10.5)
nRBC: 0 % (ref 0.0–0.2)

## 2020-09-21 LAB — MAGNESIUM: Magnesium: 1.7 mg/dL (ref 1.7–2.4)

## 2020-09-22 ENCOUNTER — Other Ambulatory Visit: Payer: Self-pay | Admitting: Adult Health

## 2020-09-22 DIAGNOSIS — E0581 Other thyrotoxicosis with thyrotoxic crisis or storm: Secondary | ICD-10-CM

## 2020-09-23 ENCOUNTER — Encounter: Payer: Self-pay | Admitting: Adult Health

## 2020-09-23 NOTE — Progress Notes (Signed)
Location:  [facility] Room Number: 116/W Place of Service:  SNF (31)   CODE STATUS: Full Code  Allergies  Allergen Reactions  . Motrin [Ibuprofen]     Unknown    Chief Complaint  Patient presents with  . Acute Visit    Pain Management    HPI:    Past Medical History:  Diagnosis Date  . Anemia   . Atrial fibrillation ([place])   . CHF (congestive heart failure) ([place])   . Diabetes mellitus without complication ([place])   . Heart murmur   . Hypertension   . Lupus ([place])   . Thyroid disease    hyperthyroid    History reviewed. No pertinent surgical history.  Social History   Socioeconomic History  . Marital status: Single    Spouse name: Not on file  . Number of children: Not on file  . Years of education: Not on file  . Highest education level: Not on file  Occupational History  . Not on file  Tobacco Use  . Smoking status: Never Smoker  . Smokeless tobacco: Never Used  Vaping Use  . Vaping Use: Never used  Substance and Sexual Activity  . Alcohol use: Not Currently  . Drug use: Not Currently  . Sexual activity: Not on file  Other Topics Concern  . Not on file  Social History Narrative  . Not on file   Social Determinants of Health   Financial Resource Strain: Not on file  Food Insecurity: Not on file  Transportation Needs: Not on file  Physical Activity: Not on file  Stress: Not on file  Social Connections: Not on file  Intimate Partner Violence: Not on file   Family History  Problem Relation Age of Onset  . Hypertension Mother       VITAL SIGNS BP 107/63   Pulse 61   Temp 98.6 F (37 C)   Resp 20   Ht 5\' 3"  (1.6 m)   Wt 176 lb 9.6 oz (80.1 kg)   SpO2 96%   BMI 31.28 kg/m   Outpatient Encounter Medications as of 09/23/2020  Medication Sig  . acetaminophen (TYLENOL) 325 MG tablet Take 2 tablets (650 mg total) by mouth every 6 (six) hours.  . ASPIRIN LOW DOSE 81 MG EC tablet Take 81 mg by mouth daily.  . feeding  supplement (ENSURE ENLIVE / ENSURE PLUS) LIQD Take 237 mLs by mouth at bedtime.  . ferrous sulfate 325 (65 FE) MG tablet Take 325 mg by mouth in the morning and at bedtime.  . folic acid (FOLVITE) 1 MG tablet Take 1 mg by mouth daily.  . methimazole (TAPAZOLE) 10 MG tablet Take 3 tablets (30 mg total) by mouth daily.  . Multiple Vitamins-Minerals (SENTRY SENIOR/LUTEIN PO) Take 1 tablet by mouth daily in the afternoon.  . NON FORMULARY Diet Regular  . Nutritional Supplements (JUVEN) POWD Take 1 packet by mouth 2 (two) times daily between meals.  Marland Kitchen omeprazole (PRILOSEC) 40 MG capsule Take 40 mg by mouth daily.  Marland Kitchen oxyCODONE (OXY IR/ROXICODONE) 5 MG immediate release tablet Take 5 mg by mouth daily in the afternoon. Prior to dressing changes  . propranolol (INDERAL) 40 MG tablet Take 1 tablet (40 mg total) by mouth 2 (two) times daily.  . silver sulfADIAZINE (SILVADENE) 1 % cream Apply 1 application topically daily. Special Instructions: Apply to wounds on BLE per tx orders.  . Vitamin D, Ergocalciferol, (DRISDOL) 1.25 MG (50000 UNIT) CAPS capsule Take 50,000  Units by mouth every 7 (seven) days. On Sunday  . zinc gluconate 50 MG tablet Take 50 mg by mouth daily.  . [DISCONTINUED] oxyCODONE (OXY IR/ROXICODONE) 5 MG immediate release tablet Take 1 tablet (5 mg total) by mouth every 4 (four) hours as needed for moderate pain or severe pain. Use only prior to wound care changes  . [DISCONTINUED] silver sulfADIAZINE (SILVADENE) 1 % cream Apply topically 2 (two) times daily.   No facility-administered encounter medications on file as of 09/23/2020.     SIGNIFICANT DIAGNOSTIC EXAMS       ASSESSMENT/ PLAN:     Ok Edwards NP [facility]  Contact [phone] Monday through Friday 8am- 5pm  After hours call [phone]

## 2020-09-24 ENCOUNTER — Encounter: Payer: Self-pay | Admitting: Internal Medicine

## 2020-09-24 ENCOUNTER — Non-Acute Institutional Stay (SKILLED_NURSING_FACILITY): Payer: Medicaid Other | Admitting: Internal Medicine

## 2020-09-24 DIAGNOSIS — L97921 Non-pressure chronic ulcer of unspecified part of left lower leg limited to breakdown of skin: Secondary | ICD-10-CM

## 2020-09-24 DIAGNOSIS — I4891 Unspecified atrial fibrillation: Secondary | ICD-10-CM | POA: Diagnosis not present

## 2020-09-24 DIAGNOSIS — L97911 Non-pressure chronic ulcer of unspecified part of right lower leg limited to breakdown of skin: Secondary | ICD-10-CM | POA: Diagnosis not present

## 2020-09-24 DIAGNOSIS — G9340 Encephalopathy, unspecified: Secondary | ICD-10-CM | POA: Diagnosis not present

## 2020-09-24 DIAGNOSIS — E0581 Other thyrotoxicosis with thyrotoxic crisis or storm: Secondary | ICD-10-CM | POA: Diagnosis not present

## 2020-09-24 DIAGNOSIS — I700 Atherosclerosis of aorta: Secondary | ICD-10-CM

## 2020-09-24 DIAGNOSIS — D649 Anemia, unspecified: Secondary | ICD-10-CM

## 2020-09-24 NOTE — Assessment & Plan Note (Signed)
09/24/2020 patient is alert, oriented, and an excellent historian with no evidence of mental status change.

## 2020-09-24 NOTE — Assessment & Plan Note (Addendum)
Wound Care Nurse at SNF will continue to monitor and treat.

## 2020-09-24 NOTE — Assessment & Plan Note (Signed)
Clinically there is no evidence of CHF although she does have chronic peripheral edema in the context of venous insufficiency.

## 2020-09-24 NOTE — Progress Notes (Signed)
NURSING HOME LOCATION: [facility] ROOM NUMBER: 116/W   CODE STATUS:  Full Code  PCP:Dr.Browning  This is a comprehensive admission note to this SNFperformed on this date less than 30 days from date of admission. Included are preadmission medical/surgical history; reconciled medication list; family history; social history and comprehensive review of systems.  Corrections and additions to the records were documented. Comprehensive physical exam was also performed. Additionally a clinical summary was entered for each active diagnosis pertinent to this admission in the Problem List to enhance continuity of care.  HPI: Patient was hospitalized 2/28-3/17/2022 with severe thyrotoxicosis complicated by acute encephalopathy and A. fib with rapid ventricular response. Confusion was present over several days.   Course was also complicated by pre-existing wounds of the lower extremities with subsequent infection as well as pancytopenia. Chart states that she had been diagnosed with thyrotoxicosis in November 2021; and methimazole was prescribed at that time but she denies this.  She does validate that she was confused at the time of admission but is an excellent historian @ present. Initial TSH was less than 0.010 with a free T4 of 2.53 (0.61-1.12), and T3 of 549 (71-180).   Admission BNP was 1098.  Imaging 2/28 revealed enlarged cardiac silhouette and aortic atherosclerosis without evidence of CHF. She was started on IV propylthiouracil, propranolol, and iodine.  She was eventually transitioned to p.o. methimazole and propanolol as she was clinically improved.  With the initial methimazole administration there was a drop in the white count and platelets.  Methimazole dose was decreased to 30 mg with improvement in the blood counts. She has chronic lower extremity ulcers secondary to venous insufficiency.  She received a 14-day course of doxycycline with daily dressing changes.  Triple  paste as well as Silvadene was applied topically with improvement in the wounds.  Pain medication was administered prior to dressing changes. She was felt to have anemia related to iron deficiency as well as chronic disease.  H/H was 8/27.6 with hypochromic indices.  She received IV iron supplementation with transition to oral ferrous sulfate at discharge. PT/OT recommended D/C to SNF for rehab.  Past medical and surgical history: Includes history of venous insufficiency, vitamin D deficiency, lupus, diabetes with peripheral vascular complications, history of congestive heart failure, and essential hypertension.  Social history: She does not drink and has never smoked.  She apparently had 2 years of business college.  Family history: Limited history reviewed.   Review of systems: She is an excellent historian. She validates the acute confusion at admission.She states that she was hospitalized because "my thyroid condition was out of whack".  She denies any thyroid related symptoms or signs at this time such as tachycardia, diarrhea, tremor, or heat intolerance. She states that the ulcerations of the feet and ankles was the reason for her admission in October 2021.  She attributes these to "venous stasis".  Constitutional: No fever, significant weight change  Eyes: No redness, discharge, pain, vision change ENT/mouth: No nasal congestion, purulent discharge, earache, change in hearing, sore throat  Cardiovascular: No chest pain, palpitations, paroxysmal nocturnal dyspnea, claudication, edema  Respiratory: No cough, sputum production, hemoptysis, DOE, significant snoring, apnea Gastrointestinal: No heartburn, dysphagia, abdominal pain, nausea /vomiting, rectal bleeding, melena, change in bowels Genitourinary: No dysuria, hematuria, pyuria, incontinence, nocturia Musculoskeletal: No joint stiffness, joint swelling, weakness, pain Dermatologic: No new rash, pruritus, change in appearance of  skin Neurologic: No dizziness, headache, syncope, seizures, numbness, tingling Psychiatric: No significant anxiety, depression, insomnia,  anorexia Endocrine: No change in hair/skin/nails, excessive thirst, excessive hunger, excessive urination  Hematologic/lymphatic: No significant bruising, lymphadenopathy, abnormal bleeding Allergy/immunology: No itchy/watery eyes, significant sneezing, urticaria, angioedema  Physical exam:  Pertinent or positive findings: Slight proptosis of the left eye is suggested.  There is no lid lag.  Thyroid is slightly asymmetric with the left lobe greater than right.  No nodules are palpable.  First and second heart sounds are accentuated.  There is a systolic murmur heard best at the left base.  She has right greater than left to and fro bruits over the carotids.  Pedal pulses are decreased.  Clinically there appears to be edema although evaluation is difficult due to dressing/wrappings of the lower extremities.  General appearance: Adequately nourished; no acute distress, increased work of breathing is present.   Lymphatic: No lymphadenopathy about the head, neck, axilla. Eyes: No conjunctival inflammation or lid edema is present. There is no scleral icterus. Ears:  External ear exam shows no significant lesions or deformities.   Nose:  External nasal examination shows no deformity or inflammation. Nasal mucosa are pink and moist without lesions, exudates Oral exam: Lips and gums are healthy appearing.There is no oropharyngeal erythema or exudate. Neck:  No masses, tenderness noted.    Heart:  Normal rate and regular rhythm without gallop, click, rub.  Lungs: Chest clear to auscultation without wheezes, rhonchi, rales, rubs. Abdomen: Bowel sounds are normal.  Abdomen is soft and nontender with no organomegaly, hernias, masses. GU: Deferred  Extremities:  No cyanosis, clubbing. Neurologic exam:  Strength equal  in upper & lower extremities. Balance, Rh[place]berg,  finger to nose testing could not be completed due to clinical state Skin: Warm & dry w/o tenting. No significant rash. Feet dressed as noted.  See clinical summary under each active problem in the Problem List with associated updated therapeutic plan/[facility]

## 2020-09-24 NOTE — Patient Instructions (Signed)
See assessment and plan under each diagnosis in the problem list and acutely for this visit 

## 2020-09-24 NOTE — Assessment & Plan Note (Addendum)
Clinically normal sinus rhythm with well-controlled rate @ present.

## 2020-09-24 NOTE — Assessment & Plan Note (Signed)
TFTs and CBC will be monitored.

## 2020-09-24 NOTE — Assessment & Plan Note (Signed)
While hospitalized with severe thyrotoxicosis H/H was 8/27.6 with hypochromic indices.  She received IV iron supplementation with transition to oral ferrous sulfate at discharge.

## 2020-09-25 ENCOUNTER — Other Ambulatory Visit: Payer: Self-pay | Admitting: Adult Health

## 2020-09-25 MED ORDER — OXYCODONE HCL 5 MG PO TABS: 5.0000 mg | ORAL_TABLET | Freq: Every day | ORAL | 0 refills | Status: DC | PRN

## 2020-09-25 NOTE — Progress Notes (Signed)
This encounter was created in error - please disregard.

## 2020-09-30 ENCOUNTER — Ambulatory Visit (INDEPENDENT_AMBULATORY_CARE_PROVIDER_SITE_OTHER): Payer: Medicaid Other | Admitting: Nurse Practitioner

## 2020-09-30 ENCOUNTER — Encounter: Payer: Self-pay | Admitting: Nurse Practitioner

## 2020-09-30 VITALS — BP 114/65 | HR 64

## 2020-09-30 DIAGNOSIS — E0590 Thyrotoxicosis, unspecified without thyrotoxic crisis or storm: Secondary | ICD-10-CM

## 2020-09-30 MED ORDER — METHIMAZOLE 10 MG PO TABS: 20.0000 mg | ORAL_TABLET | Freq: Every day | ORAL | 0 refills | Status: DC

## 2020-09-30 NOTE — Progress Notes (Signed)
09/30/2020     Endocrinology Consult Note    Subjective:    Patient ID: Candace Robinson, female    DOB: Aug 15, 1965, PCP Patient, No Pcp Per (Inactive).   Past Medical History:  Diagnosis Date  . Anemia   . Atrial fibrillation ([place])   . CHF (congestive heart failure) ([place])   . Diabetes mellitus without complication ([place])   . Heart murmur   . Hypertension   . Lupus ([place])   . Thyroid disease    hyperthyroid    History reviewed. No pertinent surgical history.  Social History   Socioeconomic History  . Marital status: Single    Spouse name: Not on file  . Number of children: Not on file  . Years of education: Not on file  . Highest education level: Not on file  Occupational History  . Not on file  Tobacco Use  . Smoking status: Never Smoker  . Smokeless tobacco: Never Used  Vaping Use  . Vaping Use: Never used  Substance and Sexual Activity  . Alcohol use: Not Currently  . Drug use: Not Currently  . Sexual activity: Not on file  Other Topics Concern  . Not on file  Social History Narrative  . Not on file   Social Determinants of Health   Financial Resource Strain: Not on file  Food Insecurity: Not on file  Transportation Needs: Not on file  Physical Activity: Not on file  Stress: Not on file  Social Connections: Not on file    Family History  Problem Relation Age of Onset  . Hypertension Mother     Outpatient Encounter Medications as of 09/30/2020  Medication Sig  . acetaminophen (TYLENOL) 325 MG tablet Take 2 tablets (650 mg total) by mouth every 6 (six) hours.  . ASPIRIN LOW DOSE 81 MG EC tablet Take 81 mg by mouth daily.  . ferrous sulfate 325 (65 FE) MG tablet Take 325 mg by mouth in the morning and at bedtime.  . folic acid (FOLVITE) 1 MG tablet Take 1 mg by mouth daily.  . Multiple Vitamins-Minerals (SENTRY SENIOR/LUTEIN PO) Take 1 tablet by mouth daily in the afternoon.  . Nutritional Supplements (JUVEN) POWD Take 1 packet by mouth 2  (two) times daily between meals.  Marland Kitchen omeprazole (PRILOSEC) 40 MG capsule Take 40 mg by mouth daily.  Marland Kitchen oxyCODONE (OXY IR/ROXICODONE) 5 MG immediate release tablet Take 1 tablet (5 mg total) by mouth daily as needed. Prior to dressing changes  . propranolol (INDERAL) 40 MG tablet Take 1 tablet (40 mg total) by mouth 2 (two) times daily.  . silver sulfADIAZINE (SILVADENE) 1 % cream Apply 1 application topically daily. Special Instructions: Apply to wounds on BLE per tx orders.  . Vitamin D, Ergocalciferol, (DRISDOL) 1.25 MG (50000 UNIT) CAPS capsule Take 50,000 Units by mouth every 7 (seven) days. On Sunday  . zinc gluconate 50 MG tablet Take 50 mg by mouth daily.  . [DISCONTINUED] methimazole (TAPAZOLE) 10 MG tablet Take 3 tablets (30 mg total) by mouth daily.  . feeding supplement (ENSURE ENLIVE / ENSURE PLUS) LIQD Take 237 mLs by mouth at bedtime.  . methimazole (TAPAZOLE) 10 MG tablet Take 2 tablets (20 mg total) by mouth daily.  . NON FORMULARY Diet Regular   No facility-administered encounter medications on file as of 09/30/2020.    ALLERGIES: Allergies  Allergen Reactions  . Motrin [Ibuprofen]     Unknown    VACCINATION STATUS: Immunization History  Administered Date(s)  Administered  . PFIZER(Purple Top)SARS-COV-2 Vaccination 09/12/2019, 06/30/2020     HPI  Candace Robinson is 55 y.o. female who presents today with a medical history as above. she is being seen in consultation for hyperthyroidism requested by Patient, No Pcp Per (Inactive).  she was recently hospitalized for complications with nonhealing wounds on her bilateral feet.  She developed altered mental status and thus thyroid labs were performed showing severely suppressed TSH and high Free T3 levels, in impending thyroid storm.  She was treated with high dose of Methimazole 30 mg po daily and sent [facility] for [facility].  Her repeat labs show improvement.   She also had baseline thyroid ultrasound which did  not show any suspicious nodules.  she currently denies dysphagia, choking, shortness of breath, no recent voice change.    she denies family history of thyroid dysfunction and denies family hx of thyroid cancer. she denies personal history of goiter. she is on any anti-thyroid medications and propanolol for symptom management. Denies use of Biotin containing supplements.  she is willing to proceed with appropriate work up and therapy for thyrotoxicosis.   Review of systems  Constitutional: + Minimally fluctuating body weight, current There is no height or weight on file to calculate BMI., no fatigue, no subjective hyperthermia, no subjective hypothermia, reports memory impairment surrounding the details of her hospitalization Eyes: no blurry vision, no xerophthalmia ENT: no sore throat, no nodules palpated in throat, no dysphagia/odynophagia, no hoarseness Cardiovascular: no chest pain, no shortness of breath, no palpitations, + leg swelling Respiratory: no cough, no shortness of breath Gastrointestinal: no nausea/vomiting/diarrhea Musculoskeletal: no muscle/joint aches Skin: no rashes, no hyperemia Neurological: no tremors, no numbness, no tingling, no dizziness Psychiatric: no depression, no anxiety   Objective:    BP 114/65   Pulse 64   Wt Readings from Last 3 Encounters:  09/24/20 176 lb 9.6 oz (80.1 kg)  09/23/20 176 lb 9.6 oz (80.1 kg)  09/18/20 187 lb (84.8 kg)     BP Readings from Last 3 Encounters:  09/30/20 114/65  09/24/20 (!) 106/58  09/23/20 107/63                        Physical Exam- Limited  Constitutional:  There is no height or weight on file to calculate BMI. , not in acute distress, normal state of mind Eyes:  EOMI, no exophthalmos Neck: Supple Thyroid: No gross goiter Cardiovascular: RRR, + murmur, rubs, or gallops, + edema to BLE Respiratory: Adequate breathing efforts, no crackles, rales, rhonchi, or wheezing Musculoskeletal: no gross deformities,  strength intact in all four extremities, no gross restriction of joint movements Skin:  no rashes, no hyperemia, healing wounds to bilateral feet Neurological: no tremor with outstretched hands   CMP     Component Value Date/Time   NA 133 (L) 09/17/2020 0247   K 4.2 09/17/2020 0247   CL 108 09/17/2020 0247   CO2 20 (L) 09/17/2020 0247   GLUCOSE 92 09/17/2020 0247   BUN 7 09/17/2020 0247   CREATININE 0.45 09/17/2020 0247   CALCIUM 9.2 09/17/2020 0247   PROT 6.6 09/10/2020 0243   ALBUMIN 2.1 (L) 09/10/2020 0243   AST 52 (H) 09/10/2020 0243   ALT 33 09/10/2020 0243   ALKPHOS 146 (H) 09/10/2020 0243   BILITOT 0.8 09/10/2020 0243   GFRNONAA >60 09/17/2020 0247     CBC    Component Value Date/Time   WBC 3.2 (L) 09/21/2020 0425  RBC 3.11 (L) 09/21/2020 0425   HGB 7.7 (L) 09/21/2020 0425   HCT 25.9 (L) 09/21/2020 0425   PLT 208 09/21/2020 0425   MCV 83.3 09/21/2020 0425   MCH 24.8 (L) 09/21/2020 0425   MCHC 29.7 (L) 09/21/2020 0425   RDW 19.9 (H) 09/21/2020 0425   LYMPHSABS 1.7 09/12/2020 0329   MONOABS 0.4 09/12/2020 0329   EOSABS 0.2 09/12/2020 0329   BASOSABS 0.0 09/12/2020 0329     Diabetic Labs (most recent): Lab Results  Component Value Date   HGBA1C 5.6 09/01/2020    Lipid Panel     Component Value Date/Time   CHOL 77 09/01/2020 0342   TRIG 52 09/01/2020 0342   HDL 20 (L) 09/01/2020 0342   CHOLHDL 3.9 09/01/2020 0342   VLDL 10 09/01/2020 0342   LDLCALC 47 09/01/2020 0342     Lab Results  Component Value Date   TSH <0.010 (L) 09/06/2020   TSH <0.010 (L) 09/04/2020   TSH <0.010 (L) 08/31/2020   FREET4 1.40 (H) 09/06/2020   FREET4 1.72 (H) 09/04/2020   FREET4 2.53 (H) 08/31/2020     Results for Candace Robinson, Candace Robinson (MRN [id number]) as of 09/30/2020 15:25  Ref. Range 08/31/2020 11:02 08/31/2020 14:50 09/04/2020 07:20 09/06/2020 01:30  TSH Latest Ref Range: 0.350 - 4.500 uIU/mL  <0.010 (L) <0.010 (L) <0.010 (L)  Triiodothyronine,Free,Serum Latest Ref Range:  2.0 - 4.4 pg/mL    5.0 (H)  Triiodothyronine (T3) Latest Ref Range: 71 - 180 ng/dL 549 (H)  247 (H)   T4,Free(Direct) Latest Ref Range: 0.61 - 1.12 ng/dL 2.53 (H)  1.72 (H) 1.40 (H)     Assessment & Plan:   1. Hyperthyroidism  she is being seen at a kind request of Patient, No Pcp Per (Inactive).  her history and most recent labs are reviewed, and she was examined clinically. Subjective and objective findings are consistent with thyrotoxicosis likely from primary hyperthyroidism. The potential risks of untreated thyrotoxicosis and the need for definitive therapy have been discussed in detail with her, and she agrees to proceed with diagnostic workup and treatment plan.   My plan is to taper her off her Methimazole slowly with the intent of performing uptake and scan which will help definitively confirm hyperthyroidism.  I advised her to lower her dose of Methimazole to 20 mg po daily.  I will have her return in 1 week to discuss results and then proceed with potentially stopping the medication so that the uptake and scan can be done. I will add TPO and thyroglobulin antibodies to be done as well as TSH, FT3, FT4.  Options of therapy are discussed with her.  We discussed the option of treating it with medications including methimazole or PTU which may have side effects including rash, transaminitis, and bone marrow suppression.  We also discussed the option of definitive therapy with RAI ablation of the thyroid. If she is found to have primary hyperthyroidism from Graves' disease , toxic multinodular goiter or toxic nodular goiter the preferred modality of treatment would be I-131 thyroid ablation. Surgery is another choice of treatment in some cases, in her case surgery is not a good fit for presentation with only mild goiter.  -Patient is made aware of the high likelihood of post ablative hypothyroidism with subsequent need for lifelong thyroid hormone replacement. sheunderstands this outcome and  she is  willing to proceed.       She can continue current Rx for propanolol for symptom management.    -Patient  is advised to maintain close follow up with Patient, No Pcp Per (Inactive) for primary care needs.   - Time spent with the patient: 60 minutes, of which >50% was spent in obtaining information about her symptoms, reviewing her previous labs, evaluations, and treatments, counseling her about her hyperthyroidism , and developing a plan to confirm the diagnosis and long term treatment as necessary. Please refer to "Patient Self Inventory" in the Media tab for reviewed elements of pertinent patient history.  Candace Robinson participated in the discussions, expressed understanding, and voiced agreement with the above plans.  All questions were answered to her satisfaction. she is encouraged to contact clinic should she have any questions or concerns prior to her return visit.   Follow up plan: Return in about 10 days (around 10/10/2020) for Thyroid follow up, Previsit labs.   Thank you for involving me in the care of this pleasant patient, and I will continue to update you with her progress.  Rayetta Pigg, [facility] [address] [place], [place] [address] Phone: [phone] Fax: [phone]  09/30/2020, 3:25 PM

## 2020-09-30 NOTE — Patient Instructions (Signed)

## 2020-10-06 ENCOUNTER — Non-Acute Institutional Stay (SKILLED_NURSING_FACILITY): Payer: Medicaid Other | Admitting: Adult Health

## 2020-10-06 ENCOUNTER — Encounter: Payer: Self-pay | Admitting: Adult Health

## 2020-10-06 DIAGNOSIS — I4891 Unspecified atrial fibrillation: Secondary | ICD-10-CM

## 2020-10-06 DIAGNOSIS — L97911 Non-pressure chronic ulcer of unspecified part of right lower leg limited to breakdown of skin: Secondary | ICD-10-CM

## 2020-10-06 DIAGNOSIS — I152 Hypertension secondary to endocrine disorders: Secondary | ICD-10-CM | POA: Diagnosis not present

## 2020-10-06 DIAGNOSIS — E1159 Type 2 diabetes mellitus with other circulatory complications: Secondary | ICD-10-CM

## 2020-10-06 DIAGNOSIS — L97921 Non-pressure chronic ulcer of unspecified part of left lower leg limited to breakdown of skin: Secondary | ICD-10-CM

## 2020-10-06 NOTE — Progress Notes (Signed)
Location:  [place] Room Number: 116 Place of Service:  SNF (31)   CODE STATUS: full code   Allergies  Allergen Reactions  . Motrin [Ibuprofen]     Unknown    Chief Complaint  Patient presents with  . Acute Visit    Wound management     HPI:  She has bilateral foot ulcerations. They are improving greatly and are down to 4 wounds present. She does have some pain with wound care; otherwise denies pain. There is no drainage present.  Her blood pressure readings are soft with bradycardia present. She is taking propranolol for her tachycardia from hyperthyroidism. She denies any palpitations; no reports of dizziness present.   Past Medical History:  Diagnosis Date  . Anemia   . Atrial fibrillation ([place])   . CHF (congestive heart failure) ([place])   . Diabetes mellitus without complication ([place])   . Heart murmur   . Hypertension   . Lupus ([place])   . Thyroid disease    hyperthyroid    History reviewed. No pertinent surgical history.  Social History   Socioeconomic History  . Marital status: Single    Spouse name: Not on file  . Number of children: Not on file  . Years of education: Not on file  . Highest education level: Not on file  Occupational History  . Not on file  Tobacco Use  . Smoking status: Never Smoker  . Smokeless tobacco: Never Used  Vaping Use  . Vaping Use: Never used  Substance and Sexual Activity  . Alcohol use: Not Currently  . Drug use: Not Currently  . Sexual activity: Not on file  Other Topics Concern  . Not on file  Social History Narrative  . Not on file   Social Determinants of Health   Financial Resource Strain: Not on file  Food Insecurity: Not on file  Transportation Needs: Not on file  Physical Activity: Not on file  Stress: Not on file  Social Connections: Not on file  Intimate Partner Violence: Not on file   Family History  Problem Relation Age of Onset  . Hypertension Mother       VITAL  SIGNS BP (!) 98/55   Pulse (!) 58   Temp 98.3 F (36.8 C)   Resp 20   Ht _0  (1.6 m)   Wt 171 lb 6.4 oz (77.7 kg)   BMI 30.36 kg/m   Outpatient Encounter Medications as of 10/06/2020  Medication Sig  . acetaminophen (TYLENOL) 325 MG tablet Take 2 tablets (650 mg total) by mouth every 6 (six) hours.  . ASPIRIN LOW DOSE 81 MG EC tablet Take 81 mg by mouth daily.  . feeding supplement (ENSURE ENLIVE / ENSURE PLUS) LIQD Take 237 mLs by mouth at bedtime.  . ferrous sulfate 325 (65 FE) MG tablet Take 325 mg by mouth in the morning and at bedtime.  . folic acid (FOLVITE) 1 MG tablet Take 1 mg by mouth daily.  . methimazole (TAPAZOLE) 10 MG tablet Take 2 tablets (20 mg total) by mouth daily.  . Multiple Vitamins-Minerals (SENTRY SENIOR/LUTEIN PO) Take 1 tablet by mouth daily in the afternoon.  . NON FORMULARY Diet Regular  . Nutritional Supplements (JUVEN) POWD Take 1 packet by mouth 2 (two) times daily between meals.  Marland Kitchen omeprazole (PRILOSEC) 40 MG capsule Take 40 mg by mouth daily.  Marland Kitchen oxyCODONE (OXY IR/ROXICODONE) 5 MG immediate release tablet Take 1 tablet (5 mg total) by mouth  daily as needed. Prior to dressing changes  . propranolol (INDERAL) 40 MG tablet Take 1 tablet (40 mg total) by mouth 2 (two) times daily.  . silver sulfADIAZINE (SILVADENE) 1 % cream Apply 1 application topically daily. Special Instructions: Apply to wounds on BLE per tx orders.  . Vitamin D, Ergocalciferol, (DRISDOL) 1.25 MG (50000 UNIT) CAPS capsule Take 50,000 Units by mouth every 7 (seven) days. On Sunday  . zinc gluconate 50 MG tablet Take 50 mg by mouth daily.   No facility-administered encounter medications on file as of 10/06/2020.     SIGNIFICANT DIAGNOSTIC EXAMS   PREVIOUS   08-31-20: chest x-ray: Enlargement of cardiac silhouette.  No acute infiltrate. Aortic Atherosclerosis   08-31-20: ct of head: Brain:  No evidence of acute infarction, hemorrhage, hydrocephalus,extra-axial collection or mass  lesion/mass effect. Empty sella likely incidental and unchanged.  09-01-20: thyroid ultrasound: Stable thyromegaly without suspicious nodules   09-01-20: 2-d echo:   Abnormal septal motion . Left ventricular ejection fraction, by  estimation, is 50 to 55%. The left ventricle has low normal function. The  left ventricle has no regional wall motion abnormalities. Left ventricular  diastolic parameters are  indeterminate.   09-02-20: right foot MRI Healed fracture deformity with periosteal reaction of the fifth metatarsal base. No definite evidence of osteomyeliti  Extensor digitorum tenosynovitis at the fifth metatarsal Superficial area of ulceration along the dorsum of the midfoot with a small 1 cm superficial fluid collection which could represent phlegmon/early abscess.  09-03-20: left foot MRI Findings suggestive of cellulitis with areas of skin induration seen overlying the digits and midfoot. No loculated fluid collections. Probable reactive marrow of the second and third proximal phalanges. No definite osteomyelitis  NO NEW EXAMS.    LABS REVIEWED PREVIOUS   08-31-20: wbc 5.9; hgb 8.0; hct 27.6; mcv 83.4 plt 191; glucose 116; bun 11; creat 0.60; k+ 3.9; na++ 139; ca 7.9; GFR>60; ast 51; albumin 2.8; BNP 1098.4; free T4 2.53; tsh <0.010; mag 1.5 hgb a1c 5.6  09-01-20: iron 12 tibc 150; ferritin 185; folate 4.5; vit B 12: 449; chol 77; ldl 47; trig 52; hdl 20; urine culture: no growth 09-02-20: vit D: 17.02; mag 1.7 09-06-20: wbc 2.7; hgb 7.5; hct 25.1; mcv 80.7 plt 107;glcuose 101; bun 7.8; creat 0.42; k+ 4.4; na++ 134; ca 9.1 GFR>60; ast 49; alk phos 135; albumin 2.2 09-17-20: wbc 4.2; hgb 7.8; hct 25.0; mcv 79.4 plt 285; glucose 92; bun 7; creat 0.45; k+ 4.2; na++ 133; ca 9.2 GFR>60  NO NEW LABS.   Review of Systems  Constitutional: Negative for malaise/fatigue.  Respiratory: Negative for cough and shortness of breath.   Cardiovascular: Negative for chest pain, palpitations and leg swelling.   Gastrointestinal: Negative for abdominal pain, constipation and heartburn.  Musculoskeletal: Negative for back pain, joint pain and myalgias.  Skin:       Wounds to bilateral feet.   Neurological: Negative for dizziness.  Psychiatric/Behavioral: The patient is not nervous/anxious.     Physical Exam Constitutional:      General: She is not in acute distress.    Appearance: She is well-developed. She is obese. She is not diaphoretic.  Neck:     Thyroid: No thyromegaly.  Cardiovascular:     Rate and Rhythm: Normal rate and regular rhythm.     Pulses: Normal pulses.     Heart sounds: Normal heart sounds.  Pulmonary:     Effort: Pulmonary effort is normal. No respiratory distress.  Breath sounds: Normal breath sounds.  Abdominal:     General: Bowel sounds are normal. There is no distension.     Palpations: Abdomen is soft.     Tenderness: There is no abdominal tenderness.  Musculoskeletal:        General: Normal range of motion.     Cervical back: Neck supple.     Right lower leg: No edema.     Left lower leg: No edema.  Lymphadenopathy:     Cervical: No cervical adenopathy.  Skin:    General: Skin is warm and dry.     Comments: Wounds:  Right dorsal great toe: 0.2 x 0.2 cm Left second toe: 1.0 x 0.6 cm Left great toe: 0.8 x 0.4 cm Left lateral malleolar 3.6 x 2.0 cm All without signs of infection present    Neurological:     Mental Status: She is alert and oriented to person, place, and time.       ASSESSMENT/ PLAN:  TODAY  1. Atrial fibrillation with RVR (thyrotoxicosis) 2. Hypertension associated with type 2 diabetes mellitus 3. Ulcers of both lower extremities limited to skin breakdown  Will continue current wound care Will lower propranolol to 20 mg twice daily due to low blood pressure readings and bradycardia     Ok Edwards NP [facility]  Contact [phone] Monday through Friday 8am- 5pm  After hours call [phone]

## 2020-10-08 ENCOUNTER — Other Ambulatory Visit (HOSPITAL_COMMUNITY)
Admission: RE | Admit: 2020-10-08 | Discharge: 2020-10-08 | Disposition: A | Discharge status: Home / Self Care | Payer: MEDICAID | Source: Skilled Nursing Facility | Attending: Adult Health | Admitting: Adult Health

## 2020-10-08 ENCOUNTER — Non-Acute Institutional Stay (SKILLED_NURSING_FACILITY): Payer: Medicaid Other | Admitting: Adult Health

## 2020-10-08 ENCOUNTER — Encounter: Payer: Self-pay | Admitting: Adult Health

## 2020-10-08 DIAGNOSIS — I700 Atherosclerosis of aorta: Secondary | ICD-10-CM

## 2020-10-08 DIAGNOSIS — L97911 Non-pressure chronic ulcer of unspecified part of right lower leg limited to breakdown of skin: Secondary | ICD-10-CM | POA: Diagnosis not present

## 2020-10-08 DIAGNOSIS — M329 Systemic lupus erythematosus, unspecified: Secondary | ICD-10-CM | POA: Diagnosis not present

## 2020-10-08 DIAGNOSIS — E0590 Thyrotoxicosis, unspecified without thyrotoxic crisis or storm: Secondary | ICD-10-CM | POA: Insufficient documentation

## 2020-10-08 DIAGNOSIS — L97921 Non-pressure chronic ulcer of unspecified part of left lower leg limited to breakdown of skin: Secondary | ICD-10-CM | POA: Diagnosis not present

## 2020-10-08 LAB — T4, FREE: Free T4: 0.78 ng/dL (ref 0.61–1.12)

## 2020-10-08 LAB — TSH: TSH: 0.018 u[IU]/mL — ABNORMAL LOW (ref 0.350–4.500)

## 2020-10-08 NOTE — Progress Notes (Signed)
Location:  [place] Room Number: 116 Place of Service:  SNF (31)   CODE STATUS: full code   Allergies  Allergen Reactions  . Motrin [Ibuprofen]     Unknown    Chief Complaint  Patient presents with  . Acute Visit    Care plan meeting.     HPI:  We have come together for her care plan meeting. Family present  BIMS 15/15 mood 0/30. She requires independent to supervision with her adls. She is occasionally incontinent of bladder and continent of bowel. She has not had a fall. She does feed herself. She began with 13 open areas on her feet is now down to 4 areas. cbg's are stable; continues with juven twice daily for albumin 2.1. her tapazole has been decreased to 20 mg daily. Her propranolol was lowered to 20 mg twice daily due to low blood pressure readings and bradycardia. Therapy is doing very well: ambulates 50-60 feet; own adls; BRP. She will be ready to be d/c within 1-2 weeks. She has a walker at home.     Weight is stable has a good appetite. . She continue to be followed for her chronic illnesses including: Aortic atherosclerosis Lupus Ulcers of both lower extremities limited to breakdown of skin  Past Medical History:  Diagnosis Date  . Anemia   . Atrial fibrillation ([place])   . CHF (congestive heart failure) ([place])   . Diabetes mellitus without complication ([place])   . Heart murmur   . Hypertension   . Lupus ([place])   . Thyroid disease    hyperthyroid    No past surgical history on file.  Social History   Socioeconomic History  . Marital status: Single    Spouse name: Not on file  . Number of children: Not on file  . Years of education: Not on file  . Highest education level: Not on file  Occupational History  . Not on file  Tobacco Use  . Smoking status: Never Smoker  . Smokeless tobacco: Never Used  Vaping Use  . Vaping Use: Never used  Substance and Sexual Activity  . Alcohol use: Not Currently  . Drug use: Not Currently  .  Sexual activity: Not on file  Other Topics Concern  . Not on file  Social History Narrative  . Not on file   Social Determinants of Health   Financial Resource Strain: Not on file  Food Insecurity: Not on file  Transportation Needs: Not on file  Physical Activity: Not on file  Stress: Not on file  Social Connections: Not on file  Intimate Partner Violence: Not on file   Family History  Problem Relation Age of Onset  . Hypertension Mother       VITAL SIGNS BP (!) 100/51   Pulse 60   Temp 97.6 F (36.4 C)   Resp 20   Ht _0  (1.6 m)   Wt 171 lb 6.4 oz (77.7 kg)   BMI 30.36 kg/m   Outpatient Encounter Medications as of 10/08/2020  Medication Sig  . acetaminophen (TYLENOL) 325 MG tablet Take 2 tablets (650 mg total) by mouth every 6 (six) hours.  . ASPIRIN LOW DOSE 81 MG EC tablet Take 81 mg by mouth daily.  . feeding supplement (ENSURE ENLIVE / ENSURE PLUS) LIQD Take 237 mLs by mouth at bedtime.  . ferrous sulfate 325 (65 FE) MG tablet Take 325 mg by mouth in the morning and at bedtime.  . folic  acid (FOLVITE) 1 MG tablet Take 1 mg by mouth daily.  . methimazole (TAPAZOLE) 10 MG tablet Take 2 tablets (20 mg total) by mouth daily.  . Multiple Vitamins-Minerals (SENTRY SENIOR/LUTEIN PO) Take 1 tablet by mouth daily in the afternoon.  . NON FORMULARY Diet Regular  . Nutritional Supplements (JUVEN) POWD Take 1 packet by mouth 2 (two) times daily between meals.  Marland Kitchen omeprazole (PRILOSEC) 40 MG capsule Take 40 mg by mouth daily.  Marland Kitchen oxyCODONE (OXY IR/ROXICODONE) 5 MG immediate release tablet Take 1 tablet (5 mg total) by mouth daily as needed. Prior to dressing changes  . propranolol (INDERAL) 40 MG tablet Take 1 tablet (40 mg total) by mouth 2 (two) times daily.  . silver sulfADIAZINE (SILVADENE) 1 % cream Apply 1 application topically daily. Special Instructions: Apply to wounds on BLE per tx orders.  . Vitamin D, Ergocalciferol, (DRISDOL) 1.25 MG (50000 UNIT) CAPS capsule Take  50,000 Units by mouth every 7 (seven) days. On Sunday  . zinc gluconate 50 MG tablet Take 50 mg by mouth daily.   No facility-administered encounter medications on file as of 10/08/2020.     SIGNIFICANT DIAGNOSTIC EXAMS   PREVIOUS   08-31-20: chest x-ray: Enlargement of cardiac silhouette.  No acute infiltrate. Aortic Atherosclerosis   08-31-20: ct of head: Brain:  No evidence of acute infarction, hemorrhage, hydrocephalus,extra-axial collection or mass lesion/mass effect. Empty sella likely incidental and unchanged.  09-01-20: thyroid ultrasound: Stable thyromegaly without suspicious nodules   09-01-20: 2-d echo:   Abnormal septal motion . Left ventricular ejection fraction, by  estimation, is 50 to 55%. The left ventricle has low normal function. The  left ventricle has no regional wall motion abnormalities. Left ventricular  diastolic parameters are  indeterminate.   09-02-20: right foot MRI Healed fracture deformity with periosteal reaction of the fifth metatarsal base. No definite evidence of osteomyeliti  Extensor digitorum tenosynovitis at the fifth metatarsal Superficial area of ulceration along the dorsum of the midfoot with a small 1 cm superficial fluid collection which could represent phlegmon/early abscess.  09-03-20: left foot MRI Findings suggestive of cellulitis with areas of skin induration seen overlying the digits and midfoot. No loculated fluid collections. Probable reactive marrow of the second and third proximal phalanges. No definite osteomyelitis  NO NEW EXAMS.    LABS REVIEWED PREVIOUS   08-31-20: wbc 5.9; hgb 8.0; hct 27.6; mcv 83.4 plt 191; glucose 116; bun 11; creat 0.60; k+ 3.9; na++ 139; ca 7.9; GFR>60; ast 51; albumin 2.8; BNP 1098.4; free T4 2.53; tsh <0.010; mag 1.5 hgb a1c 5.6  09-01-20: iron 12 tibc 150; ferritin 185; folate 4.5; vit B 12: 449; chol 77; ldl 47; trig 52; hdl 20; urine culture: no growth 09-02-20: vit D: 17.02; mag 1.7 09-06-20: wbc 2.7; hgb 7.5;  hct 25.1; mcv 80.7 plt 107;glcuose 101; bun 7.8; creat 0.42; k+ 4.4; na++ 134; ca 9.1 GFR>60; ast 49; alk phos 135; albumin 2.2 09-17-20: wbc 4.2; hgb 7.8; hct 25.0; mcv 79.4 plt 285; glucose 92; bun 7; creat 0.45; k+ 4.2; na++ 133; ca 9.2 GFR>60  NO NEW LAB.   Review of Systems  Constitutional: Negative for malaise/fatigue.  Respiratory: Negative for cough and shortness of breath.   Cardiovascular: Negative for chest pain, palpitations and leg swelling.  Gastrointestinal: Negative for abdominal pain, constipation and heartburn.  Musculoskeletal: Negative for back pain, joint pain and myalgias.  Skin: Negative.   Neurological: Negative for dizziness.  Psychiatric/Behavioral: The patient is not nervous/anxious.  Physical Exam Constitutional:      General: She is not in acute distress.    Appearance: She is well-developed. She is not diaphoretic.  Neck:     Thyroid: Thyromegaly present.  Cardiovascular:     Rate and Rhythm: Normal rate and regular rhythm.     Pulses: Normal pulses.     Heart sounds: Murmur heard.    Pulmonary:     Effort: Pulmonary effort is normal. No respiratory distress.     Breath sounds: Normal breath sounds.  Abdominal:     General: Bowel sounds are normal. There is no distension.     Palpations: Abdomen is soft.     Tenderness: There is no abdominal tenderness.  Musculoskeletal:        General: Normal range of motion.     Comments: Bilateral lower extremities: are hard to touch; with bilateral lower extremity venous insufficiency changes.  Lymphadenopathy:     Cervical: No cervical adenopathy.  Skin:    General: Skin is warm and dry.     Comments: Dressing intact to both feet.   Neurological:     Mental Status: She is alert and oriented to person, place, and time.  Psychiatric:        Mood and Affect: Mood normal.      ASSESSMENT/ PLAN:  TODAY  1. Aortic atherosclerosis 2. Lupus 3. Ulcers of both lower extremities limited to breakdown of  skin  Will continue current plan of care Will continue current medications Will continue therapy as directed The goal of her care is to go home; has own walker.   Time spent with patient and family: 35 minutes >50% spent with coordination of care; to include therapy medications and wound management.   Ok Edwards NP [facility]  Contact [phone] Monday through Friday 8am- 5pm  After hours call [phone]

## 2020-10-09 ENCOUNTER — Other Ambulatory Visit (HOSPITAL_COMMUNITY)
Admission: RE | Admit: 2020-10-09 | Discharge: 2020-10-09 | Disposition: A | Discharge status: Home / Self Care | Payer: MEDICAID | Source: Skilled Nursing Facility | Attending: Adult Health | Admitting: Adult Health

## 2020-10-09 DIAGNOSIS — E0590 Thyrotoxicosis, unspecified without thyrotoxic crisis or storm: Secondary | ICD-10-CM | POA: Insufficient documentation

## 2020-10-09 LAB — TSH: TSH: 0.029 u[IU]/mL — ABNORMAL LOW (ref 0.350–4.500)

## 2020-10-09 LAB — T4, FREE: Free T4: 0.73 ng/dL (ref 0.61–1.12)

## 2020-10-10 LAB — THYROID ANTIBODIES
Thyroglobulin Antibody: <1 IU/mL (ref 0.0–0.9)
Thyroperoxidase Ab SerPl-aCnc: 136 IU/mL — ABNORMAL HIGH (ref 0–34)

## 2020-10-10 LAB — THYROID PEROXIDASE ANTIBODY: Thyroperoxidase Ab SerPl-aCnc: 161 IU/mL — ABNORMAL HIGH (ref 0–34)

## 2020-10-10 LAB — THYROGLOBULIN ANTIBODY: Thyroglobulin Antibody: <1 IU/mL (ref 0.0–0.9)

## 2020-10-10 LAB — T3, FREE: T3, Free: 1.5 pg/mL — ABNORMAL LOW (ref 2.0–4.4)

## 2020-10-14 ENCOUNTER — Non-Acute Institutional Stay (SKILLED_NURSING_FACILITY): Payer: Medicaid Other | Admitting: Adult Health

## 2020-10-14 ENCOUNTER — Other Ambulatory Visit: Payer: Self-pay | Admitting: Adult Health

## 2020-10-14 ENCOUNTER — Encounter: Payer: Self-pay | Admitting: Adult Health

## 2020-10-14 DIAGNOSIS — L932 Other local lupus erythematosus: Secondary | ICD-10-CM

## 2020-10-14 DIAGNOSIS — D508 Other iron deficiency anemias: Secondary | ICD-10-CM

## 2020-10-14 DIAGNOSIS — E1159 Type 2 diabetes mellitus with other circulatory complications: Secondary | ICD-10-CM

## 2020-10-14 DIAGNOSIS — E0581 Other thyrotoxicosis with thyrotoxic crisis or storm: Secondary | ICD-10-CM | POA: Diagnosis not present

## 2020-10-14 DIAGNOSIS — E0590 Thyrotoxicosis, unspecified without thyrotoxic crisis or storm: Secondary | ICD-10-CM

## 2020-10-14 MED ORDER — METHIMAZOLE 10 MG PO TABS: 20.0000 mg | ORAL_TABLET | Freq: Every day | ORAL | 0 refills | Status: DC

## 2020-10-14 MED ORDER — PROPRANOLOL HCL 20 MG PO TABS: 20.0000 mg | ORAL_TABLET | Freq: Two times a day (BID) | ORAL | 0 refills | Status: DC

## 2020-10-14 MED ORDER — VITAMIN D (ERGOCALCIFEROL) 1.25 MG (50000 UNIT) PO CAPS: 50000.0000 [IU] | ORAL_CAPSULE | ORAL | 0 refills | Status: DC

## 2020-10-14 MED ORDER — OMEPRAZOLE 40 MG PO CPDR: 40.0000 mg | DELAYED_RELEASE_CAPSULE | Freq: Every day | ORAL | 0 refills | Status: DC

## 2020-10-14 MED ORDER — FERROUS SULFATE 325 (65 FE) MG PO TABS
325.0000 mg | ORAL_TABLET | Freq: Two times a day (BID) | ORAL | 0 refills | Status: AC
Start: 2020-10-14 — End: ?

## 2020-10-14 MED ORDER — FOLIC ACID 1 MG PO TABS: 1.0000 mg | ORAL_TABLET | Freq: Every day | ORAL | 0 refills | Status: DC

## 2020-10-14 NOTE — Patient Instructions (Signed)

## 2020-10-14 NOTE — Progress Notes (Signed)
Location:   [facility] Room Number: 932 Place of Service:  SNF (31)    CODE STATUS: full code   Allergies  Allergen Reactions  . Motrin [Ibuprofen]     Unknown    Chief Complaint  Patient presents with  . Discharge Note    HPI:  She is being discharged to home. She will not need home health. She will not require any dme. She will need her medications ordered and will need to follow up with her medical provider. She had been hospitalized for her wounds on her feet. She was admitted to this facility for wound management and therapy to improve upon her level of independence with her adls. Her wounds have nearly resolved only requiring a bandaid. She has participated in ot/ot and was successful with her treatment. She is ready to return back home.    Past Medical History:  Diagnosis Date  . Anemia   . Atrial fibrillation ([place])   . CHF (congestive heart failure) ([place])   . Diabetes mellitus without complication ([place])   . Heart murmur   . Hypertension   . Lupus ([place])   . Thyroid disease    hyperthyroid    History reviewed. No pertinent surgical history.  Social History   Socioeconomic History  . Marital status: Single    Spouse name: Not on file  . Number of children: Not on file  . Years of education: Not on file  . Highest education level: Not on file  Occupational History  . Not on file  Tobacco Use  . Smoking status: Never Smoker  . Smokeless tobacco: Never Used  Vaping Use  . Vaping Use: Never used  Substance and Sexual Activity  . Alcohol use: Not Currently  . Drug use: Not Currently  . Sexual activity: Not on file  Other Topics Concern  . Not on file  Social History Narrative  . Not on file   Social Determinants of Health   Financial Resource Strain: Not on file  Food Insecurity: Not on file  Transportation Needs: Not on file  Physical Activity: Not on file  Stress: Not on file  Social Connections: Not on file  Intimate  Partner Violence: Not on file   Family History  Problem Relation Age of Onset  . Hypertension Mother     VITAL SIGNS BP (!) 106/59   Pulse 66   Temp (!) 97.4 F (36.3 C)   Ht 5' 3" (1.6 m)   Wt 171 lb 6.4 oz (77.7 kg)   BMI 30.36 kg/m   Patient's Medications  New Prescriptions   No medications on file  Previous Medications   ACETAMINOPHEN (TYLENOL) 325 MG TABLET    Take 2 tablets (650 mg total) by mouth every 6 (six) hours.   ASPIRIN LOW DOSE 81 MG EC TABLET    Take 81 mg by mouth daily.   FEEDING SUPPLEMENT (ENSURE ENLIVE / ENSURE PLUS) LIQD    Take 237 mLs by mouth at bedtime.   FERROUS SULFATE 325 (65 FE) MG TABLET    Take 325 mg by mouth in the morning and at bedtime.   FOLIC ACID (FOLVITE) 1 MG TABLET    Take 1 mg by mouth daily.   METHIMAZOLE (TAPAZOLE) 10 MG TABLET    Take 2 tablets (20 mg total) by mouth daily.   MULTIPLE VITAMINS-MINERALS (SENTRY SENIOR/LUTEIN PO)    Take 1 tablet by mouth daily in the afternoon.   NON FORMULARY  Diet Regular   NUTRITIONAL SUPPLEMENTS (JUVEN) POWD    Take 1 packet by mouth 2 (two) times daily between meals.   OMEPRAZOLE (PRILOSEC) 40 MG CAPSULE    Take 40 mg by mouth daily.   PROPRANOLOL (INDERAL) 20 MG TABLET    Take 20 mg by mouth 2 (two) times daily.   VITAMIN D, ERGOCALCIFEROL, (DRISDOL) 1.25 MG (50000 UNIT) CAPS CAPSULE    Take 50,000 Units by mouth every 7 (seven) days. On Sunday   ZINC GLUCONATE 50 MG TABLET    Take 50 mg by mouth daily.  Modified Medications   No medications on file  Discontinued Medications   OXYCODONE (OXY IR/ROXICODONE) 5 MG IMMEDIATE RELEASE TABLET    Take 1 tablet (5 mg total) by mouth daily as needed. Prior to dressing changes   PROPRANOLOL (INDERAL) 40 MG TABLET    Take 1 tablet (40 mg total) by mouth 2 (two) times daily.   SILVER SULFADIAZINE (SILVADENE) 1 % CREAM    Apply 1 application topically daily. Special Instructions: Apply to wounds on BLE per tx orders.     SIGNIFICANT DIAGNOSTIC  EXAMS   PREVIOUS   08-31-20: chest x-ray: Enlargement of cardiac silhouette.  No acute infiltrate. Aortic Atherosclerosis   08-31-20: ct of head: Brain:  No evidence of acute infarction, hemorrhage, hydrocephalus,extra-axial collection or mass lesion/mass effect. Empty sella likely incidental and unchanged.  09-01-20: thyroid ultrasound: Stable thyromegaly without suspicious nodules   09-01-20: 2-d echo:   Abnormal septal motion . Left ventricular ejection fraction, by  estimation, is 50 to 55%. The left ventricle has low normal function. The  left ventricle has no regional wall motion abnormalities. Left ventricular  diastolic parameters are  indeterminate.   09-02-20: right foot MRI Healed fracture deformity with periosteal reaction of the fifth metatarsal base. No definite evidence of osteomyeliti  Extensor digitorum tenosynovitis at the fifth metatarsal Superficial area of ulceration along the dorsum of the midfoot with a small 1 cm superficial fluid collection which could represent phlegmon/early abscess.  09-03-20: left foot MRI Findings suggestive of cellulitis with areas of skin induration seen overlying the digits and midfoot. No loculated fluid collections. Probable reactive marrow of the second and third proximal phalanges. No definite osteomyelitis  NO NEW EXAMS.    LABS REVIEWED PREVIOUS   08-31-20: wbc 5.9; hgb 8.0; hct 27.6; mcv 83.4 plt 191; glucose 116; bun 11; creat 0.60; k+ 3.9; na++ 139; ca 7.9; GFR>60; ast 51; albumin 2.8; BNP 1098.4; free T4 2.53; tsh <0.010; mag 1.5 hgb a1c 5.6  09-01-20: iron 12 tibc 150; ferritin 185; folate 4.5; vit B 12: 449; chol 77; ldl 47; trig 52; hdl 20; urine culture: no growth 09-02-20: vit D: 17.02; mag 1.7 09-06-20: wbc 2.7; hgb 7.5; hct 25.1; mcv 80.7 plt 107;glcuose 101; bun 7.8; creat 0.42; k+ 4.4; na++ 134; ca 9.1 GFR>60; ast 49; alk phos 135; albumin 2.2 09-17-20: wbc 4.2; hgb 7.8; hct 25.0; mcv 79.4 plt 285; glucose 92; bun 7; creat 0.45; k+  4.2; na++ 133; ca 9.2 GFR>60  NO NEW LAB.   Review of Systems  Constitutional: Negative for malaise/fatigue.  Respiratory: Negative for cough and shortness of breath.   Cardiovascular: Negative for chest pain, palpitations and leg swelling.  Gastrointestinal: Negative for abdominal pain, constipation and heartburn.  Musculoskeletal: Negative for back pain, joint pain and myalgias.  Skin: Negative.   Neurological: Negative for dizziness.  Psychiatric/Behavioral: The patient is not nervous/anxious.     Physical Exam Constitutional:  General: She is not in acute distress.    Appearance: She is well-developed. She is not diaphoretic.  Neck:     Thyroid: Thyromegaly present.  Cardiovascular:     Rate and Rhythm: Normal rate and regular rhythm.     Pulses: Normal pulses.     Heart sounds: Murmur heard.    Pulmonary:     Effort: Pulmonary effort is normal. No respiratory distress.     Breath sounds: Normal breath sounds.  Abdominal:     General: Bowel sounds are normal. There is no distension.     Palpations: Abdomen is soft.     Tenderness: There is no abdominal tenderness.  Musculoskeletal:        General: Normal range of motion.     Right lower leg: No edema.     Left lower leg: No edema.     Comments: Bilateral lower extremities: are hard to touch; with bilateral lower extremity venous insufficiency changes  Lymphadenopathy:     Cervical: No cervical adenopathy.  Skin:    General: Skin is warm and dry.  Neurological:     Mental Status: She is alert and oriented to person, place, and time.  Psychiatric:        Mood and Affect: Mood normal.   '  ASSESSMENT/ PLAN:   Patient is being discharged with the following home health services:  None needed   Patient is being discharged with the following durable medical equipment:  None needed   Patient has been advised to f/u with their PCP in 1-2 weeks to bring them up to date on their rehab stay.  Social services at  facility was responsible for arranging this appointment.  Pt was provided with a 30 day supply of prescriptions for medications and refills must be obtained from their PCP.  For controlled substances, a more limited supply may be provided adequate until PCP appointment only.  A 30 day supply of her prescription medications have been sent to [facility] on [address]   Time spent with patient 30 minutes: medications; no home health; no dme.   Ok Edwards NP [facility]  Contact [phone] Monday through Friday 8am- 5pm  After hours call [phone]

## 2020-10-15 ENCOUNTER — Ambulatory Visit (INDEPENDENT_AMBULATORY_CARE_PROVIDER_SITE_OTHER): Payer: Medicaid Other | Admitting: Nurse Practitioner

## 2020-10-15 ENCOUNTER — Encounter: Payer: Self-pay | Admitting: Nurse Practitioner

## 2020-10-15 VITALS — BP 121/74 | HR 69 | Wt 165.0 lb

## 2020-10-15 DIAGNOSIS — E0590 Thyrotoxicosis, unspecified without thyrotoxic crisis or storm: Secondary | ICD-10-CM | POA: Diagnosis not present

## 2020-10-15 DIAGNOSIS — E0500 Thyrotoxicosis with diffuse goiter without thyrotoxic crisis or storm: Secondary | ICD-10-CM

## 2020-10-15 NOTE — Progress Notes (Signed)
10/15/2020     Endocrinology Follow Up Note    Subjective:    Patient ID: Candace Robinson, female    DOB: 10-Sep-1965, PCP Patient, No Pcp Per (Inactive).   Past Medical History:  Diagnosis Date  . Anemia   . Atrial fibrillation ([place])   . CHF (congestive heart failure) ([place])   . Diabetes mellitus without complication ([place])   . Heart murmur   . Hypertension   . Lupus ([place])   . Thyroid disease    hyperthyroid    History reviewed. No pertinent surgical history.  Social History   Socioeconomic History  . Marital status: Single    Spouse name: Not on file  . Number of children: Not on file  . Years of education: Not on file  . Highest education level: Not on file  Occupational History  . Not on file  Tobacco Use  . Smoking status: Never Smoker  . Smokeless tobacco: Never Used  Vaping Use  . Vaping Use: Never used  Substance and Sexual Activity  . Alcohol use: Not Currently  . Drug use: Not Currently  . Sexual activity: Not on file  Other Topics Concern  . Not on file  Social History Narrative  . Not on file   Social Determinants of Health   Financial Resource Strain: Not on file  Food Insecurity: Not on file  Transportation Needs: Not on file  Physical Activity: Not on file  Stress: Not on file  Social Connections: Not on file    Family History  Problem Relation Age of Onset  . Hypertension Mother     Outpatient Encounter Medications as of 10/15/2020  Medication Sig  . acetaminophen (TYLENOL) 325 MG tablet Take 2 tablets (650 mg total) by mouth every 6 (six) hours.  . ASPIRIN LOW DOSE 81 MG EC tablet Take 81 mg by mouth daily.  . feeding supplement (ENSURE ENLIVE / ENSURE PLUS) LIQD Take 237 mLs by mouth at bedtime.  . ferrous sulfate 325 (65 FE) MG tablet Take 1 tablet (325 mg total) by mouth in the morning and at bedtime.  . folic acid (FOLVITE) 1 MG tablet Take 1 tablet (1 mg total) by mouth daily.  . Multiple Vitamins-Minerals (SENTRY  SENIOR/LUTEIN PO) Take 1 tablet by mouth daily in the afternoon.  . NON FORMULARY Diet Regular  . omeprazole (PRILOSEC) 40 MG capsule Take 1 capsule (40 mg total) by mouth daily.  . propranolol (INDERAL) 20 MG tablet Take 1 tablet (20 mg total) by mouth 2 (two) times daily.  . Vitamin D, Ergocalciferol, (DRISDOL) 1.25 MG (50000 UNIT) CAPS capsule Take 1 capsule (50,000 Units total) by mouth every 7 (seven) days. On Sunday  . zinc gluconate 50 MG tablet Take 50 mg by mouth daily.  . [DISCONTINUED] methimazole (TAPAZOLE) 10 MG tablet Take 2 tablets (20 mg total) by mouth daily.   No facility-administered encounter medications on file as of 10/15/2020.    ALLERGIES: Allergies  Allergen Reactions  . Motrin [Ibuprofen]     Unknown    VACCINATION STATUS: Immunization History  Administered Date(s) Administered  . PFIZER(Purple Top)SARS-COV-2 Vaccination 09/12/2019, 06/30/2020     HPI  Candace Robinson is 55 y.o. female who presents today with a medical history as above. she is being seen in follow up after being seen in consultation for hyperthyroidism requested by Patient, No Pcp Per (Inactive).  she was recently hospitalized for complications with nonhealing wounds on her bilateral feet.  She developed altered  mental status and thus thyroid labs were performed showing severely suppressed TSH and high Free T3 levels, in impending thyroid storm.  She was treated with high dose of Methimazole 30 mg po daily and sent to the [facility] for rehabilitation (she gets discharged from rehab on Saturday).  Her repeat labs show improvement.   She also had baseline thyroid ultrasound which did not show any suspicious nodules.  she currently denies dysphagia, choking, shortness of breath, no recent voice change.    she denies family history of thyroid dysfunction and denies family hx of thyroid cancer. she denies personal history of goiter. she is on any anti-thyroid medications and propanolol for symptom  management. Denies use of Biotin containing supplements.  she is willing to proceed with appropriate work up and therapy for thyrotoxicosis.   Review of systems  Constitutional: + Minimally fluctuating body weight, current Body mass index is 29.23 kg/m., no fatigue, no subjective hyperthermia, no subjective hypothermia, reports memory impairment surrounding the details of her hospitalization Eyes: no blurry vision, no xerophthalmia ENT: no sore throat, no nodules palpated in throat, no dysphagia/odynophagia, no hoarseness Cardiovascular: no chest pain, no shortness of breath, no palpitations, + leg swelling Respiratory: no cough, no shortness of breath Gastrointestinal: no nausea/vomiting/diarrhea Musculoskeletal: no muscle/joint aches Skin: no rashes, no hyperemia Neurological: no tremors, no numbness, no tingling, no dizziness Psychiatric: no depression, no anxiety   Objective:    BP 121/74 (BP Location: Right Arm, Patient Position: Sitting)   Pulse 69   Wt 165 lb (74.8 kg)   BMI 29.23 kg/m   Wt Readings from Last 3 Encounters:  10/15/20 165 lb (74.8 kg)  10/14/20 171 lb 6.4 oz (77.7 kg)  10/08/20 171 lb 6.4 oz (77.7 kg)     BP Readings from Last 3 Encounters:  10/15/20 121/74  10/14/20 (!) 106/59  10/08/20 (!) 100/51                        Physical Exam- Limited  Constitutional:  There is no height or weight on file to calculate BMI. , not in acute distress, normal state of mind Eyes:  EOMI, no exophthalmos Neck: Supple Cardiovascular: RRR, + murmur, rubs, or gallops, + edema to BLE Respiratory: Adequate breathing efforts, no crackles, rales, rhonchi, or wheezing Musculoskeletal: no gross deformities, strength intact in all four extremities, no gross restriction of joint movements Skin:  no rashes, no hyperemia, healing wounds to bilateral feet Neurological: no tremor with outstretched hands   CMP     Component Value Date/Time   NA 133 (L) 09/17/2020 0247   K  4.2 09/17/2020 0247   CL 108 09/17/2020 0247   CO2 20 (L) 09/17/2020 0247   GLUCOSE 92 09/17/2020 0247   BUN 7 09/17/2020 0247   CREATININE 0.45 09/17/2020 0247   CALCIUM 9.2 09/17/2020 0247   PROT 6.6 09/10/2020 0243   ALBUMIN 2.1 (L) 09/10/2020 0243   AST 52 (H) 09/10/2020 0243   ALT 33 09/10/2020 0243   ALKPHOS 146 (H) 09/10/2020 0243   BILITOT 0.8 09/10/2020 0243   GFRNONAA >60 09/17/2020 0247     CBC    Component Value Date/Time   WBC 3.2 (L) 09/21/2020 0425   RBC 3.11 (L) 09/21/2020 0425   HGB 7.7 (L) 09/21/2020 0425   HCT 25.9 (L) 09/21/2020 0425   PLT 208 09/21/2020 0425   MCV 83.3 09/21/2020 0425   MCH 24.8 (L) 09/21/2020 0425   MCHC 29.7 (L)  09/21/2020 0425   RDW 19.9 (H) 09/21/2020 0425   LYMPHSABS 1.7 09/12/2020 0329   MONOABS 0.4 09/12/2020 0329   EOSABS 0.2 09/12/2020 0329   BASOSABS 0.0 09/12/2020 0329     Diabetic Labs (most recent): Lab Results  Component Value Date   HGBA1C 5.6 09/01/2020    Lipid Panel     Component Value Date/Time   CHOL 77 09/01/2020 0342   TRIG 52 09/01/2020 0342   HDL 20 (L) 09/01/2020 0342   CHOLHDL 3.9 09/01/2020 0342   VLDL 10 09/01/2020 0342   LDLCALC 47 09/01/2020 0342     Lab Results  Component Value Date   TSH 0.029 (L) 10/09/2020   TSH 0.018 (L) 10/08/2020   TSH <0.010 (L) 09/06/2020   TSH <0.010 (L) 09/04/2020   TSH <0.010 (L) 08/31/2020   FREET4 0.73 10/09/2020   FREET4 0.78 10/08/2020   FREET4 1.40 (H) 09/06/2020   FREET4 1.72 (H) 09/04/2020   FREET4 2.53 (H) 08/31/2020     Results for SHALUNDA, LINDH (MRN [id number]) as of 09/30/2020 15:25  Ref. Range 08/31/2020 11:02 08/31/2020 14:50 09/04/2020 07:20 09/06/2020 01:30  TSH Latest Ref Range: 0.350 - 4.500 uIU/mL  <0.010 (L) <0.010 (L) <0.010 (L)  Triiodothyronine,Free,Serum Latest Ref Range: 2.0 - 4.4 pg/mL    5.0 (H)  Triiodothyronine (T3) Latest Ref Range: 71 - 180 ng/dL 549 (H)  247 (H)   T4,Free(Direct) Latest Ref Range: 0.61 - 1.12 ng/dL 2.53 (H)   1.72 (H) 1.40 (H)    Results for ORPAH, HAUSNER (MRN [id number]) as of 10/15/2020 15:17  Ref. Range 10/08/2020 06:22 10/09/2020 08:13 10/09/2020 08:14  TSH Latest Ref Range: 0.350 - 4.500 uIU/mL 0.018 (L) 0.029 (L)   Triiodothyronine,Free,Serum Latest Ref Range: 2.0 - 4.4 pg/mL   1.5 (L)  T4,Free(Direct) Latest Ref Range: 0.61 - 1.12 ng/dL 0.78 0.73   Thyroperoxidase Ab SerPl-aCnc Latest Ref Range: 0 - 34 IU/mL 136 (H) 161 (H)   Thyroglobulin Antibody Latest Ref Range: 0.0 - 0.9 IU/mL <1.0 <1.0      Assessment & Plan:   1. Hyperthyroidism- r/t Graves disease  she is being seen at a kind request of Patient, No Pcp Per (Inactive).  her history and most recent labs are reviewed, and she was examined clinically. Subjective and objective findings are consistent with thyrotoxicosis likely from primary hyperthyroidism. The potential risks of untreated thyrotoxicosis and the need for definitive therapy have been discussed in detail with her, and she agrees to proceed with diagnostic workup and treatment plan.   At last visit, her Methimazole was decreased to 20 mg po daily. Her repeat thyroid function tests after 1 week at that dose are stable, therefore we can stop her Methimazole today with plans to perform uptake and scan after 5 days off Methimazole.  Her TPO antibodies were elevated indicating autoimmune etiology for her thyroid dysfunction, further suggestive of Graves Disease.  Options of therapy are discussed with her.  We discussed the option of treating it with medications including methimazole or PTU which may have side effects including rash, transaminitis, and bone marrow suppression.  We also discussed the option of definitive therapy with RAI ablation of the thyroid. If she is found to have primary hyperthyroidism from Graves' disease , toxic multinodular goiter or toxic nodular goiter the preferred modality of treatment would be I-131 thyroid ablation. Surgery is another choice of treatment  in some cases, in her case surgery is not a good fit for presentation with only mild goiter.  -  Patient is made aware of the high likelihood of post ablative hypothyroidism with subsequent need for lifelong thyroid hormone replacement. sheunderstands this outcome and she is  willing to proceed.       She can continue current Rx for propanolol for symptom management.    -Patient is advised to maintain close follow up with Patient, No Pcp Per (Inactive) for primary care needs.    I spent 25 minutes in the care of the patient today including review of labs from Thyroid Function, CMP, and other relevant labs ; imaging/biopsy records (current and previous including abstractions from other facilities); face-to-face time discussing  her lab results and symptoms, medications doses, her options of short and long term treatment based on the latest standards of care / guidelines;   and documenting the encounter.  Candace Robinson  participated in the discussions, expressed understanding, and voiced agreement with the above plans.  All questions were answered to her satisfaction. she is encouraged to contact clinic should she have any questions or concerns prior to her return visit.  Follow up plan: Return in about 10 days (around 10/25/2020) for Thyroid follow up, uptake and scan.   Thank you for involving me in the care of this pleasant patient, and I will continue to update you with her progress.  Rayetta Pigg, [facility] [address] [place], [place] [address] Phone: [phone] Fax: [phone]  10/15/2020, 3:18 PM

## 2020-10-19 ENCOUNTER — Telehealth: Payer: Self-pay

## 2020-10-19 NOTE — Telephone Encounter (Signed)
Left a message requesting a return call to the office. 

## 2020-10-19 NOTE — Telephone Encounter (Signed)
Discussed with pt to stay off of her methimazole until she has her uptake and scan, if she begins to experience any symptoms before she can have scan to call our office. Understanding voiced.

## 2020-10-19 NOTE — Telephone Encounter (Signed)
Received call from patient stating that there are some concerns about incorrect diagnoses on list. She would like to have these corrected, if at all possible. If all can't be corrected she really needs the lupus corrected. Patient states that she is in disability appeals and needs for her medical records to be correct when sent out.She was discharged from [facility] The diagnoses in question are  M32.9 Lupus- it should be cutaneous lupus instead of systemic lupus I11.0 Hypertensvis heart disease with heart failure. She doesn't have heart failure H57.47 Chronic diastolic congestive heart failure, she does not have   She has a heart murmur, which is not on her list at all. Please advise.  Routed to Ok Edwards, NP

## 2020-10-19 NOTE — Telephone Encounter (Signed)
Pt said she would like her nuc med scan to be done at [facility] instead of [facility] since pt is living with her sister in [place]. Please call pt and advise

## 2020-10-19 NOTE — Telephone Encounter (Signed)
Advised pt we will switch locations to [facility] and she'll be contacted with a date time for uptake and scan.

## 2020-10-20 ENCOUNTER — Encounter: Payer: Self-pay | Admitting: Adult Health

## 2020-10-20 DIAGNOSIS — L932 Other local lupus erythematosus: Secondary | ICD-10-CM | POA: Insufficient documentation

## 2020-10-20 NOTE — Telephone Encounter (Signed)
Called and discussed the change of the lupus diagnosis with patient. She was very pleased that it was changed and understood that since the congestive heart failure was part of her history that it could not be changed.

## 2020-10-27 ENCOUNTER — Encounter (HOSPITAL_COMMUNITY)
Admission: RE | Admit: 2020-10-27 | Discharge: 2020-10-27 | Disposition: A | Discharge status: Home / Self Care | Payer: Medicaid Other | Source: Ambulatory Visit | Attending: Nurse Practitioner | Admitting: Nurse Practitioner

## 2020-10-27 ENCOUNTER — Other Ambulatory Visit (HOSPITAL_COMMUNITY): Payer: Self-pay

## 2020-10-27 ENCOUNTER — Ambulatory Visit (HOSPITAL_COMMUNITY)
Admission: RE | Admit: 2020-10-27 | Discharge: 2020-10-27 | Disposition: A | Discharge status: Home / Self Care | Payer: Medicaid Other | Source: Ambulatory Visit | Attending: Nurse Practitioner | Admitting: Nurse Practitioner

## 2020-10-27 ENCOUNTER — Ambulatory Visit (HOSPITAL_COMMUNITY): Payer: MEDICAID

## 2020-10-27 ENCOUNTER — Other Ambulatory Visit: Payer: Self-pay

## 2020-10-27 DIAGNOSIS — E0590 Thyrotoxicosis, unspecified without thyrotoxic crisis or storm: Secondary | ICD-10-CM | POA: Insufficient documentation

## 2020-10-27 MED ORDER — SODIUM IODIDE I-123 7.4 MBQ CAPS
480.0000 | ORAL_CAPSULE | Freq: Once | ORAL | Status: AC
  Administered 2020-10-27: 480 via ORAL

## 2020-10-28 ENCOUNTER — Encounter (HOSPITAL_COMMUNITY)
Admission: RE | Admit: 2020-10-28 | Discharge: 2020-10-28 | Disposition: A | Discharge status: Home / Self Care | Payer: Medicaid Other | Source: Ambulatory Visit | Attending: Nurse Practitioner | Admitting: Nurse Practitioner

## 2020-10-28 ENCOUNTER — Other Ambulatory Visit: Payer: Self-pay | Admitting: Nurse Practitioner

## 2020-10-28 ENCOUNTER — Telehealth: Payer: Self-pay | Admitting: Nurse Practitioner

## 2020-10-28 ENCOUNTER — Other Ambulatory Visit (HOSPITAL_COMMUNITY): Payer: Self-pay

## 2020-10-28 ENCOUNTER — Ambulatory Visit: Payer: MEDICAID | Admitting: Nurse Practitioner

## 2020-10-28 DIAGNOSIS — E0500 Thyrotoxicosis with diffuse goiter without thyrotoxic crisis or storm: Secondary | ICD-10-CM

## 2020-10-28 DIAGNOSIS — E0590 Thyrotoxicosis, unspecified without thyrotoxic crisis or storm: Secondary | ICD-10-CM

## 2020-10-28 NOTE — Telephone Encounter (Signed)
I discussed her uptake and scan results with her.  Will hold off on restarting Methimazole for now and proceed with RAI ablation soon!

## 2020-10-28 NOTE — Telephone Encounter (Signed)
Can you please call her, she has questions and concerns

## 2020-10-28 NOTE — Telephone Encounter (Signed)
Please advise 

## 2020-10-28 NOTE — Telephone Encounter (Signed)
Is she seeing the return of her symptoms?  I have not yet seen any results yet on her uptake and scan.

## 2020-10-28 NOTE — Telephone Encounter (Signed)
Pt called and states that she has finished all of her scans and wants to know if it is okay for her to resume taking her thyroid medicine that was prescribed to her when she was in the nursing center.  Methimazole 10mg  3 tablets by mouth daily is her rx.

## 2020-11-04 ENCOUNTER — Ambulatory Visit: Payer: MEDICAID | Admitting: Nurse Practitioner

## 2020-11-19 ENCOUNTER — Ambulatory Visit (HOSPITAL_COMMUNITY): Payer: Self-pay

## 2020-11-19 LAB — TSH: TSH: 0.01 — AB (ref 0.41–5.90)

## 2020-11-20 ENCOUNTER — Ambulatory Visit (HOSPITAL_COMMUNITY): Payer: Self-pay

## 2020-12-11 ENCOUNTER — Other Ambulatory Visit: Payer: Self-pay

## 2020-12-11 ENCOUNTER — Encounter (HOSPITAL_COMMUNITY)
Admission: RE | Admit: 2020-12-11 | Discharge: 2020-12-11 | Disposition: A | Discharge status: Home / Self Care | Payer: Medicaid Other | Source: Ambulatory Visit | Attending: Nurse Practitioner | Admitting: Nurse Practitioner

## 2020-12-11 DIAGNOSIS — E0500 Thyrotoxicosis with diffuse goiter without thyrotoxic crisis or storm: Secondary | ICD-10-CM | POA: Insufficient documentation

## 2020-12-11 DIAGNOSIS — E0590 Thyrotoxicosis, unspecified without thyrotoxic crisis or storm: Secondary | ICD-10-CM | POA: Diagnosis present

## 2020-12-11 MED ORDER — SODIUM IODIDE I 131 CAPSULE
11.9000 | Freq: Once | INTRAVENOUS | Status: AC | PRN
  Administered 2020-12-11: 11.9 via ORAL

## 2020-12-14 ENCOUNTER — Other Ambulatory Visit: Payer: Self-pay | Admitting: Nurse Practitioner

## 2021-01-08 ENCOUNTER — Other Ambulatory Visit: Payer: Self-pay | Admitting: Adult Health

## 2021-01-15 ENCOUNTER — Other Ambulatory Visit: Payer: Self-pay | Admitting: Adult Health

## 2021-01-22 ENCOUNTER — Other Ambulatory Visit: Payer: Self-pay | Admitting: Adult Health

## 2021-02-06 LAB — TSH: TSH: 0.012 u[IU]/mL — ABNORMAL LOW (ref 0.450–4.500)

## 2021-02-06 LAB — T4, FREE: Free T4: 1.01 ng/dL (ref 0.82–1.77)

## 2021-02-16 ENCOUNTER — Other Ambulatory Visit: Payer: Self-pay

## 2021-02-16 ENCOUNTER — Ambulatory Visit (INDEPENDENT_AMBULATORY_CARE_PROVIDER_SITE_OTHER): Payer: Medicaid Other | Admitting: Nurse Practitioner

## 2021-02-16 ENCOUNTER — Encounter: Payer: Self-pay | Admitting: Nurse Practitioner

## 2021-02-16 VITALS — BP 99/66 | HR 80 | Ht 63.0 in | Wt 157.0 lb

## 2021-02-16 DIAGNOSIS — E0590 Thyrotoxicosis, unspecified without thyrotoxic crisis or storm: Secondary | ICD-10-CM | POA: Diagnosis not present

## 2021-02-16 DIAGNOSIS — E0500 Thyrotoxicosis with diffuse goiter without thyrotoxic crisis or storm: Secondary | ICD-10-CM

## 2021-02-16 NOTE — Progress Notes (Signed)
02/16/2021     Endocrinology Follow Up Note    Subjective:    Patient ID: Candace Robinson, female    DOB: 22-May-1966, PCP Patient, No Pcp Per (Inactive).   Past Medical History:  Diagnosis Date   Anemia    Atrial fibrillation ([facility])    CHF (congestive heart failure) ([place])    Diabetes mellitus without complication ([place])    Heart murmur    Hypertension    Lupus ([place])    Thyroid disease    hyperthyroid    History reviewed. No pertinent surgical history.  Social History   Socioeconomic History   Marital status: Single    Spouse name: Not on file   Number of children: Not on file   Years of education: Not on file   Highest education level: Not on file  Occupational History   Not on file  Tobacco Use   Smoking status: Never   Smokeless tobacco: Never  Vaping Use   Vaping Use: Never used  Substance and Sexual Activity   Alcohol use: Not Currently   Drug use: Not Currently   Sexual activity: Not on file  Other Topics Concern   Not on file  Social History Narrative   Not on file   Social Determinants of Health   Financial Resource Strain: Not on file  Food Insecurity: Not on file  Transportation Needs: Not on file  Physical Activity: Not on file  Stress: Not on file  Social Connections: Not on file    Family History  Problem Relation Age of Onset   Hypertension Mother     Outpatient Encounter Medications as of 02/16/2021  Medication Sig   acetaminophen (TYLENOL) 325 MG tablet Take 2 tablets (650 mg total) by mouth every 6 (six) hours.   ASPIRIN LOW DOSE 81 MG EC tablet Take 81 mg by mouth daily.   ferrous sulfate 325 (65 FE) MG tablet Take 1 tablet (325 mg total) by mouth in the morning and at bedtime.   Multiple Vitamins-Minerals (SENTRY SENIOR/LUTEIN PO) Take 1 tablet by mouth daily in the afternoon.   NON FORMULARY Diet Regular   omeprazole (PRILOSEC) 40 MG capsule Take 1 capsule (40 mg total) by mouth daily.   propranolol (INDERAL) 20 MG  tablet Take 1 tablet (20 mg total) by mouth 2 (two) times daily. (Patient not taking: Reported on 02/16/2021)   Vitamin D, Ergocalciferol, (DRISDOL) 1.25 MG (50000 UNIT) CAPS capsule Take 1 capsule (50,000 Units total) by mouth every 7 (seven) days. On Sunday (Patient not taking: Reported on 02/16/2021)   zinc gluconate 50 MG tablet Take 50 mg by mouth daily.   [DISCONTINUED] feeding supplement (ENSURE ENLIVE / ENSURE PLUS) LIQD Take 237 mLs by mouth at bedtime.   [DISCONTINUED] folic acid (FOLVITE) 1 MG tablet Take 1 tablet (1 mg total) by mouth daily.   No facility-administered encounter medications on file as of 02/16/2021.    ALLERGIES: Allergies  Allergen Reactions   Motrin [Ibuprofen]     Unknown    VACCINATION STATUS: Immunization History  Administered Date(s) Administered   PFIZER(Purple Top)SARS-COV-2 Vaccination 09/12/2019, 06/30/2020     HPI  Candace Robinson is 55 y.o. female who presents today with a medical history as above. she is being seen in follow up after being seen in consultation for hyperthyroidism requested by Patient, No Pcp Per (Inactive).  she was recently hospitalized for complications with nonhealing wounds on her bilateral feet.  She developed altered mental status and  thus thyroid labs were performed showing severely suppressed TSH and high Free T3 levels, in impending thyroid storm.  She was treated with high dose of Methimazole 30 mg po daily and sent to the [facility] for rehabilitation where she has since been sent home.  She also had baseline thyroid ultrasound which did not show any suspicious nodules.  she currently denies dysphagia, choking, shortness of breath, no recent voice change.    she denies family history of thyroid dysfunction and denies family hx of thyroid cancer. she denies personal history of goiter. she is on any anti-thyroid medications and propanolol for symptom management. Denies use of Biotin containing supplements.    Once her  thyroid labs stabilized, we were able to taper and stop her Methimazole and proceed with NM uptake and scan which confirmed Graves disease.  She is s/p RAI therapy on 12/11/20.   Review of systems  Constitutional: + Minimally fluctuating body weight,  current Body mass index is 27.81 kg/m. , no fatigue, no subjective hyperthermia, no subjective hypothermia Eyes: no blurry vision, no xerophthalmia ENT: no sore throat, no nodules palpated in throat, no dysphagia/odynophagia, no hoarseness Cardiovascular: no chest pain, no shortness of breath, no palpitations, + leg swelling Respiratory: no cough, no shortness of breath Gastrointestinal: no nausea/vomiting/diarrhea Musculoskeletal: no muscle/joint aches Skin: no rashes, no hyperemia, still has wounds to bilateral feet- sees wound specialist next week Neurological: no tremors, no numbness, no tingling, no dizziness Psychiatric: no depression, no anxiety   Objective:    BP 99/66   Pulse 80   Ht '5\' 3"'$  (1.6 m)   Wt 157 lb (71.2 kg)   BMI 27.81 kg/m   Wt Readings from Last 3 Encounters:  02/16/21 157 lb (71.2 kg)  10/15/20 165 lb (74.8 kg)  10/14/20 171 lb 6.4 oz (77.7 kg)     BP Readings from Last 3 Encounters:  02/16/21 99/66  10/15/20 121/74  10/14/20 (!) 106/59                       Physical Exam- Limited  Constitutional:  Body mass index is 27.81 kg/m. , not in acute distress, normal state of mind, tearful when reflecting on her health concerns over the last year Eyes:  EOMI, no exophthalmos Neck: Supple Cardiovascular: RRR, + murmur, rubs, or gallops, + edema to BLE Respiratory: Adequate breathing efforts, no crackles, rales, rhonchi, or wheezing Musculoskeletal: no gross deformities, strength intact in all four extremities, no gross restriction of joint movements Skin:  no rashes, no hyperemia, legs wrapped in compression wrap Neurological: no tremor with outstretched hands   CMP     Component Value Date/Time   NA  133 (L) 09/17/2020 0247   K 4.2 09/17/2020 0247   CL 108 09/17/2020 0247   CO2 20 (L) 09/17/2020 0247   GLUCOSE 92 09/17/2020 0247   BUN 7 09/17/2020 0247   CREATININE 0.45 09/17/2020 0247   CALCIUM 9.2 09/17/2020 0247   PROT 6.6 09/10/2020 0243   ALBUMIN 2.1 (L) 09/10/2020 0243   AST 52 (H) 09/10/2020 0243   ALT 33 09/10/2020 0243   ALKPHOS 146 (H) 09/10/2020 0243   BILITOT 0.8 09/10/2020 0243   GFRNONAA >60 09/17/2020 0247     CBC    Component Value Date/Time   WBC 3.2 (L) 09/21/2020 0425   RBC 3.11 (L) 09/21/2020 0425   HGB 7.7 (L) 09/21/2020 0425   HCT 25.9 (L) 09/21/2020 0425   PLT 208 09/21/2020 0425  MCV 83.3 09/21/2020 0425   MCH 24.8 (L) 09/21/2020 0425   MCHC 29.7 (L) 09/21/2020 0425   RDW 19.9 (H) 09/21/2020 0425   LYMPHSABS 1.7 09/12/2020 0329   MONOABS 0.4 09/12/2020 0329   EOSABS 0.2 09/12/2020 0329   BASOSABS 0.0 09/12/2020 0329     Diabetic Labs (most recent): Lab Results  Component Value Date   HGBA1C 5.6 09/01/2020    Lipid Panel     Component Value Date/Time   CHOL 77 09/01/2020 0342   TRIG 52 09/01/2020 0342   HDL 20 (L) 09/01/2020 0342   CHOLHDL 3.9 09/01/2020 0342   VLDL 10 09/01/2020 0342   LDLCALC 47 09/01/2020 0342     Lab Results  Component Value Date   TSH 0.012 (L) 02/05/2021   TSH 0.01 (A) 11/18/2020   TSH 0.029 (L) 10/09/2020   TSH 0.018 (L) 10/08/2020   TSH <0.010 (L) 09/06/2020   TSH <0.010 (L) 09/04/2020   TSH <0.010 (L) 08/31/2020   FREET4 1.01 02/05/2021   FREET4 0.73 10/09/2020   FREET4 0.78 10/08/2020   FREET4 1.40 (H) 09/06/2020   FREET4 1.72 (H) 09/04/2020   FREET4 2.53 (H) 08/31/2020     Results for BERNADINE, GATHINGS (MRN [id number]) as of 09/30/2020 15:25  Ref. Range 08/31/2020 11:02 08/31/2020 14:50 09/04/2020 07:20 09/06/2020 01:30  TSH Latest Ref Range: 0.350 - 4.500 uIU/mL  <0.010 (L) <0.010 (L) <0.010 (L)  Triiodothyronine,Free,Serum Latest Ref Range: 2.0 - 4.4 pg/mL    5.0 (H)  Triiodothyronine (T3)  Latest Ref Range: 71 - 180 ng/dL 549 (H)  247 (H)   T4,Free(Direct) Latest Ref Range: 0.61 - 1.12 ng/dL 2.53 (H)  1.72 (H) 1.40 (H)    Results for ASENAT, EZEKIEL (MRN [id number]) as of 10/15/2020 15:17  Ref. Range 10/08/2020 06:22 10/09/2020 08:13 10/09/2020 08:14  TSH Latest Ref Range: 0.350 - 4.500 uIU/mL 0.018 (L) 0.029 (L)   Triiodothyronine,Free,Serum Latest Ref Range: 2.0 - 4.4 pg/mL   1.5 (L)  T4,Free(Direct) Latest Ref Range: 0.61 - 1.12 ng/dL 0.78 0.73   Thyroperoxidase Ab SerPl-aCnc Latest Ref Range: 0 - 34 IU/mL 136 (H) 161 (H)   Thyroglobulin Antibody Latest Ref Range: 0.0 - 0.9 IU/mL <1.0 <1.0      Results for JIWOO, OTT (MRN [id number]) as of 02/16/2021 10:11  Ref. Range 02/05/2021 09:04  TSH Latest Ref Range: 0.450 - 4.500 uIU/mL 0.012 (L)  T4,Free(Direct) Latest Ref Range: 0.82 - 1.77 ng/dL 1.01    Assessment & Plan:   1. Hyperthyroidism- r/t Graves disease  She is s/p RAI treatment on 12/11/20.  Her repeat thyroid function tests show improvement, but not yet to a level where thyroid hormone replacement is recommended.  Will recheck thyroid function tests in 2 months to assess when she is ready to start thyroid hormone.    -Patient is advised to maintain close follow up with Patient, No Pcp Per (Inactive) for primary care needs.   I spent 31 minutes in the care of the patient today including review of labs from Thyroid Function, CMP, and other relevant labs ; imaging/biopsy records (current and previous including abstractions from other facilities); face-to-face time discussing  her lab results and symptoms, medications doses, her options of short and long term treatment based on the latest standards of care / guidelines;   and documenting the encounter.  Candace Robinson  participated in the discussions, expressed understanding, and voiced agreement with the above plans.  All questions were answered to her satisfaction. she is  encouraged to contact clinic should  she have any questions or concerns prior to her return visit.    Follow up plan: Return in about 2 months (around 04/18/2021) for Thyroid follow up, Previsit labs.   Rayetta Pigg, [facility] [address] [place], [place] [address] Phone: [phone] Fax: [phone]  02/16/2021, 10:21 AM

## 2021-02-22 ENCOUNTER — Encounter (HOSPITAL_BASED_OUTPATIENT_CLINIC_OR_DEPARTMENT_OTHER): Payer: Medicaid Other | Attending: Internal Medicine | Admitting: Internal Medicine

## 2021-02-22 ENCOUNTER — Other Ambulatory Visit: Payer: Self-pay

## 2021-02-22 DIAGNOSIS — E1151 Type 2 diabetes mellitus with diabetic peripheral angiopathy without gangrene: Secondary | ICD-10-CM | POA: Insufficient documentation

## 2021-02-22 DIAGNOSIS — L930 Discoid lupus erythematosus: Secondary | ICD-10-CM | POA: Diagnosis not present

## 2021-02-22 DIAGNOSIS — E11621 Type 2 diabetes mellitus with foot ulcer: Secondary | ICD-10-CM | POA: Diagnosis not present

## 2021-02-22 DIAGNOSIS — I110 Hypertensive heart disease with heart failure: Secondary | ICD-10-CM | POA: Diagnosis not present

## 2021-02-22 DIAGNOSIS — I5032 Chronic diastolic (congestive) heart failure: Secondary | ICD-10-CM | POA: Diagnosis not present

## 2021-02-22 DIAGNOSIS — L97519 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: Secondary | ICD-10-CM | POA: Diagnosis not present

## 2021-02-22 DIAGNOSIS — I739 Peripheral vascular disease, unspecified: Secondary | ICD-10-CM | POA: Diagnosis not present

## 2021-02-22 DIAGNOSIS — L97529 Non-pressure chronic ulcer of other part of left foot with unspecified severity: Secondary | ICD-10-CM | POA: Diagnosis not present

## 2021-02-22 NOTE — Progress Notes (Signed)
FINOLA, STATZER ([id number]) Visit Report for 02/22/2021 Chief Complaint Document Details Patient Name: Date of Service: [name], [name] 02/22/2021 9:00 A M Medical Record Number: [id number] Patient Account Number: [id number] Date of Birth/Sex: Treating RN: [birth year] (55 y.o. [name] Primary Care Provider: PA Haig Prophet, [place] Other Clinician: Referring Provider: Treating Provider/Extender: Darcey Nora in Treatment: 0 Information Obtained from: Patient Chief Complaint Bilateral feet wounds Electronic Signature(s) Signed: 02/22/2021 11:50:52 AM By: Kalman Shan DO Entered By: Kalman Shan on 02/22/2021 11:38:58 -------------------------------------------------------------------------------- Debridement Details Patient Name: Date of Service: [name], [name] 02/22/2021 9:00 A M Medical Record Number: [id number] Patient Account Number: [id number] Date of Birth/Sex: Treating RN: March 18, 1966 (55 y.o. [name] Primary Care Provider: PA Haig Prophet, [place] Other Clinician: Referring Provider: Treating Provider/Extender: Darcey Nora in Treatment: 0 Debridement Performed for Assessment: Wound #1 Right,Medial Ankle Performed By: Physician Kalman Shan, DO Debridement Type: Chemical/Enzymatic/Mechanical Agent Used: Gauze, Anasept Severity of Tissue Pre Debridement: Fat layer exposed Level of Consciousness (Pre-procedure): Awake and Alert Pre-procedure Verification/Time Out Yes - 10:50 Taken: Start Time: 10:51 Pain Control: Other : Benzocaine Instrument: Other : Gauze Bleeding: Minimum Hemostasis Achieved: Pressure End Time: 10:53 Response to Treatment: Procedure was tolerated well Level of Consciousness (Post- Awake and Alert procedure): Post Debridement Measurements of Total Wound Length: (cm) 5 Width: (cm) 4.2 Depth: (cm) 0.2 Volume: (cm) 3.299 Character of Wound/Ulcer Post Debridement:  Stable Severity of Tissue Post Debridement: Fat layer exposed Post Procedure Diagnosis Same as Pre-procedure Electronic Signature(s) Signed: 02/22/2021 11:32:23 AM By: Lorrin Jackson Signed: 02/22/2021 11:50:52 AM By: Kalman Shan DO Entered By: Lorrin Jackson on 02/22/2021 11:32:23 -------------------------------------------------------------------------------- Debridement Details Patient Name: Date of Service: [name], [name] 02/22/2021 9:00 A M Medical Record Number: [id number] Patient Account Number: [id number] Date of Birth/Sex: Treating RN: 1966-02-13 (55 y.o. [name] Primary Care Provider: PA Haig Prophet, [place] Other Clinician: Referring Provider: Treating Provider/Extender: Darcey Nora in Treatment: 0 Debridement Performed for Assessment: Wound #2 Right,Dorsal Foot Performed By: Physician Kalman Shan, DO Debridement Type: Chemical/Enzymatic/Mechanical Agent Used: Gauze, Anasept Severity of Tissue Pre Debridement: Fat layer exposed Level of Consciousness (Pre-procedure): Awake and Alert Pre-procedure Verification/Time Out Yes - 10:50 Taken: Start Time: 10:53 Pain Control: Other : Benzocaine Instrument: Other : Gauze Bleeding: Minimum Hemostasis Achieved: Pressure End Time: 10:55 Response to Treatment: Procedure was tolerated well Level of Consciousness (Post- Awake and Alert procedure): Post Debridement Measurements of Total Wound Length: (cm) 3 Width: (cm) 5 Depth: (cm) 0.1 Volume: (cm) 1.178 Character of Wound/Ulcer Post Debridement: Stable Severity of Tissue Post Debridement: Fat layer exposed Post Procedure Diagnosis Same as Pre-procedure Electronic Signature(s) Signed: 02/22/2021 11:32:51 AM By: Lorrin Jackson Signed: 02/22/2021 11:50:52 AM By: Kalman Shan DO Entered By: Lorrin Jackson on 02/22/2021 11:32:50 -------------------------------------------------------------------------------- Debridement  Details Patient Name: Date of Service: [name], [name] 02/22/2021 9:00 A M Medical Record Number: [id number] Patient Account Number: [id number] Date of Birth/Sex: Treating RN: [birth year] (55 y.o. [name] Primary Care Provider: PA Haig Prophet, [place] Other Clinician: Referring Provider: Treating Provider/Extender: Darcey Nora in Treatment: 0 Debridement Performed for Assessment: Wound #3 Left,Medial Ankle Performed By: Physician Kalman Shan, DO Debridement Type: Chemical/Enzymatic/Mechanical Agent Used: Gauze, Anasept Severity of Tissue Pre Debridement: Fat layer exposed Level of Consciousness (Pre-procedure): Awake and Alert Pre-procedure Verification/Time Out Yes - 10:50 Taken: Start Time: 10:55 Pain  Control: Other : Benzocaine Instrument: Other : Gauze Bleeding: Minimum Hemostasis Achieved: Pressure End Time: 10:57 Response to Treatment: Procedure was tolerated well Level of Consciousness (Post- Awake and Alert procedure): Post Debridement Measurements of Total Wound Length: (cm) 1 Width: (cm) 2.5 Depth: (cm) 0.2 Volume: (cm) 0.393 Character of Wound/Ulcer Post Debridement: Stable Severity of Tissue Post Debridement: Fat layer exposed Post Procedure Diagnosis Same as Pre-procedure Electronic Signature(s) Signed: 02/22/2021 11:33:21 AM By: Lorrin Jackson Signed: 02/22/2021 11:50:52 AM By: Kalman Shan DO Entered By: Lorrin Jackson on 02/22/2021 11:33:21 -------------------------------------------------------------------------------- Debridement Details Patient Name: Date of Service: [name], [name] 02/22/2021 9:00 A M Medical Record Number: [id number] Patient Account Number: [id number] Date of Birth/Sex: Treating RN: May 17, 1966 (55 y.o. [name] Primary Care Provider: PA Haig Prophet, [place] Other Clinician: Referring Provider: Treating Provider/Extender: Darcey Nora in Treatment:  0 Debridement Performed for Assessment: Wound #4 Left,Lateral Ankle Performed By: Physician Kalman Shan, DO Debridement Type: Chemical/Enzymatic/Mechanical Agent Used: Gauze, Anasept Severity of Tissue Pre Debridement: Fat layer exposed Level of Consciousness (Pre-procedure): Awake and Alert Pre-procedure Verification/Time Out Yes - 10:50 Taken: Start Time: 10:57 Pain Control: Other : Benzocaine Instrument: Other : Gauze Bleeding: Minimum Hemostasis Achieved: Pressure End Time: 10:59 Response to Treatment: Procedure was tolerated well Level of Consciousness (Post- Awake and Alert procedure): Post Debridement Measurements of Total Wound Length: (cm) 8 Width: (cm) 6 Depth: (cm) 0.2 Volume: (cm) 7.54 Character of Wound/Ulcer Post Debridement: Stable Severity of Tissue Post Debridement: Fat layer exposed Post Procedure Diagnosis Same as Pre-procedure Electronic Signature(s) Signed: 02/22/2021 11:33:53 AM By: Lorrin Jackson Signed: 02/22/2021 11:50:52 AM By: Kalman Shan DO Entered By: Lorrin Jackson on 02/22/2021 11:33:53 -------------------------------------------------------------------------------- Debridement Details Patient Name: Date of Service: [name], [name] 02/22/2021 9:00 A M Medical Record Number: [id number] Patient Account Number: [id number] Date of Birth/Sex: Treating RN: 23-Jan-1966 (55 y.o. [name] Primary Care Provider: PA TIENT, [place] Other Clinician: Referring Provider: Treating Provider/Extender: Darcey Nora in Treatment: 0 Debridement Performed for Assessment: Wound #5 Left,Dorsal Foot Performed By: Physician Kalman Shan, DO Debridement Type: Chemical/Enzymatic/Mechanical Agent Used: Gauze, Anasept Severity of Tissue Pre Debridement: Fat layer exposed Level of Consciousness (Pre-procedure): Awake and Alert Pre-procedure Verification/Time Out Yes - 10:50 Taken: Start Time: 10:59 Pain  Control: Other : Benzocaine Instrument: Other : Gauze Bleeding: Minimum Hemostasis Achieved: Pressure End Time: 11:01 Response to Treatment: Procedure was tolerated well Level of Consciousness (Post- Awake and Alert procedure): Post Debridement Measurements of Total Wound Length: (cm) 9 Width: (cm) 8.5 Depth: (cm) 0.1 Volume: (cm) 6.008 Character of Wound/Ulcer Post Debridement: Stable Severity of Tissue Post Debridement: Fat layer exposed Post Procedure Diagnosis Same as Pre-procedure Electronic Signature(s) Signed: 02/22/2021 11:34:31 AM By: Lorrin Jackson Signed: 02/22/2021 11:50:52 AM By: Kalman Shan DO Entered By: Lorrin Jackson on 02/22/2021 11:34:31 -------------------------------------------------------------------------------- Debridement Details Patient Name: Date of Service: [name], [name] 02/22/2021 9:00 A M Medical Record Number: [id number] Patient Account Number: [id number] Date of Birth/Sex: Treating RN: [birth year] (55 y.o. [name] Primary Care Provider: PA Haig Prophet, [place] Other Clinician: Referring Provider: Treating Provider/Extender: Darcey Nora in Treatment: 0 Debridement Performed for Assessment: Wound #6 Left,Medial Foot Performed By: Physician Kalman Shan, DO Debridement Type: Chemical/Enzymatic/Mechanical Agent Used: Gauze, Anasept Severity of Tissue Pre Debridement: Fat layer exposed Level of Consciousness (Pre-procedure): Awake and Alert Pre-procedure Verification/Time Out Yes - 10:50 Taken: Start Time: 11:01 Pain Control:  Other : Benzocaine Instrument: Other : Gauze Bleeding: Minimum Hemostasis Achieved: Pressure End Time: 11:03 Response to Treatment: Procedure was tolerated well Level of Consciousness (Post- Awake and Alert procedure): Post Debridement Measurements of Total Wound Length: (cm) 0.5 Width: (cm) 0.8 Depth: (cm) 0.1 Volume: (cm) 0.031 Character of Wound/Ulcer Post  Debridement: Stable Severity of Tissue Post Debridement: Fat layer exposed Post Procedure Diagnosis Same as Pre-procedure Electronic Signature(s) Signed: 02/22/2021 11:35:06 AM By: Lorrin Jackson Signed: 02/22/2021 11:50:52 AM By: Kalman Shan DO Entered By: Lorrin Jackson on 02/22/2021 11:35:05 -------------------------------------------------------------------------------- HPI Details Patient Name: Date of Service: [name], [name] 02/22/2021 9:00 A M Medical Record Number: [id number] Patient Account Number: [id number] Date of Birth/Sex: Treating RN: Dec 30, 1965 (55 y.o. [name] Primary Care Provider: PA Haig Prophet, [place] Other Clinician: Referring Provider: Treating Provider/Extender: Darcey Nora in Treatment: 0 History of Present Illness HPI Description: Admission 8/22 Ms. Khady Duwe is a 55 year old female with a past medical history of diet-controlled type 2 diabetes, hypothyroidism, chronic diastolic heart failure, cutaneous lupus erythematosus and essential hypertension that presents to the clinic for a 55-monthhistory of worsening bilateral feet ulcers. She states that wounds to her feet have been an ongoing issue for several years. She states that with wound care they improve however they tend to wax and wane. 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 She was discharged to a nursing facility and she was provided wound care for her bilateral feet ulcers at that time. She states they used Silvadene and silver alginate. She states that when she was discharged in April from the nursing facility her wounds had improved but were not fully closed. She currently uses Silvadene but has not been using silver alginate for several months due to running out. She reports significant decline in her wound healing over the last 4 months. She reports pain to the wound sites. She currently denies increased warmth erythema  or purulent drainage. Electronic Signature(s) Signed: 02/22/2021 11:50:52 AM By: HKalman ShanDO Entered By: HKalman Shanon 02/22/2021 11:43:54 -------------------------------------------------------------------------------- Physical Exam Details Patient Name: Date of Service: [name], [name] 02/22/2021 9:00 A M Medical Record Number: [id number] Account Number: [id number] of Birth/Sex: Treating RN: 1Jun 15, 1967(55y.o. FSue LushPrimary Care Provider: PA THaig Prophet [place] Clinician: Referring Provider: Treating Provider/Extender: HDarcey Norain Treatment: 0 Constitutional respirations regular, non-labored and within target range for patient..Marland KitchenPsychiatric pleasant and cooperative. Notes Right lower extremity: T the medial ankle and dorsum of the right foot she has open wounds with necrotic debris and some granulation tissue present. o Left lower extremity: T the left medial ankle she has a small open wound with nonviable tissue and granulation tissue present. T the left lateral ankle she has o o a large open wound with granulation tissue and nonviable tissue present. T the left dorsum of the foot she has significant necrotic debris. T the medial left o o foot she has small open wounds with granulation tissue and nonviable tissue present. No obvious signs of infection to all wound sites. Difficult to palpate pedal pulses Electronic Signature(s) Signed: 02/22/2021 11:50:52 AM By: HKalman ShanDO Entered By: HKalman Shanon 02/22/2021 11:46:17 -------------------------------------------------------------------------------- Physician Orders Details Patient Name: Date of Service: BRO A DGwinda Passe [name] 02/22/2021 9:00 A M Medical Record Number: [id number] Account Number: [id number] of Birth/Sex: Treating RN: 112-22-1967(55y.o.  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 - with Dr. Heber Teaticket Return A Other: - [facility]=Supplies Bathing/ Shower/ Hygiene May shower and wash wound with soap and water. - when dressings changed Edema Control - Lymphedema / SCD / Other Elevate legs to the level of the heart or above for 30 minutes daily and/or when sitting, a frequency of: Avoid standing for long periods of time. Additional Orders / Instructions Follow Nutritious Diet - 100-120g of protein daily Home Health dmit to [facility] for wound care. May utilize formulary equivalent dressing for wound treatment orders unless otherwise specified. - A 02/22/21: Will submit order to [facility] agencies Wound Treatment Wound #1 - Ankle Wound Laterality: Right, Medial Cleanser: Soap and Water 1 x Per Day/30 Days Discharge Instructions: May shower and wash wound with dial antibacterial soap and water prior to dressing change. Cleanser: Wound Cleanser 1 x Per Day/30 Days Discharge Instructions: Cleanse the wound with wound cleanser prior to applying a clean dressing using gauze sponges, not tissue or cotton balls. Prim Dressing: KerraCel Ag Gelling Fiber Dressing, 4x5 in (silver alginate) 1 x Per Day/30 Days ary Discharge Instructions: Apply silver alginate to wound bed as instructed Secondary  Dressing: Woven Gauze Sponge, Non-Sterile 4x4 in 1 x Per Day/30 Days Discharge Instructions: Apply over primary dressing as directed. Secondary Dressing: ABD Pad, 8x10 1 x Per Day/30 Days Discharge Instructions: Apply over primary dressing as directed. Secured With: Elastic Bandage 4 inch (ACE bandage) 1 x Per Day/30 Days Discharge Instructions: Secure with ACE bandage as directed. Secured With: [employer], 4.5x3.1 (in/yd) 1 x Per Day/30 Days Discharge Instructions: Secure with Kerlix as directed. Secured With: Transpore Surgical Tape, 2x10 (in/yd) 1 x Per Day/30 Days Discharge Instructions: Secure dressing with tape as directed. Wound #2 - Foot Wound Laterality: Dorsal, Right Cleanser: Soap and Water 1 x Per Day/30 Days Discharge Instructions: May shower and wash wound with dial antibacterial soap and water prior to dressing change. Cleanser: Wound Cleanser 1 x Per Day/30 Days Discharge Instructions: Cleanse the wound with wound cleanser prior to applying a clean dressing using gauze sponges, not tissue or cotton balls. Prim Dressing: KerraCel Ag Gelling Fiber Dressing, 4x5 in (silver alginate) 1 x Per Day/30 Days ary Discharge Instructions: Apply silver alginate to wound bed as instructed Secondary Dressing: Woven Gauze Sponge, Non-Sterile 4x4 in 1 x Per Day/30 Days Discharge Instructions: Apply over primary dressing as directed. Secondary Dressing: ABD Pad, 8x10 1 x Per Day/30 Days Discharge Instructions: Apply over primary dressing as directed. Secured With: Elastic Bandage 4 inch (ACE bandage) 1 x Per Day/30 Days Discharge Instructions: Secure with ACE bandage as directed. Secured With: [employer], 4.5x3.1 (in/yd) 1 x Per Day/30 Days Discharge Instructions: Secure with Kerlix as directed. Secured With: Transpore Surgical Tape, 2x10 (in/yd) 1 x Per Day/30 Days Discharge Instructions: Secure dressing with tape as directed. Wound #3 - Ankle Wound Laterality: Left,  Medial Cleanser: Soap and  Water 1 x Per Day/30 Days Discharge Instructions: May shower and wash wound with dial antibacterial soap and water prior to dressing change. Cleanser: Wound Cleanser 1 x Per Day/30 Days Discharge Instructions: Cleanse the wound with wound cleanser prior to applying a clean dressing using gauze sponges, not tissue or cotton balls. Prim Dressing: KerraCel Ag Gelling Fiber Dressing, 4x5 in (silver alginate) 1 x Per Day/30 Days ary Discharge Instructions: Apply silver alginate to wound bed as instructed Secondary Dressing: Woven Gauze Sponge, Non-Sterile 4x4 in 1 x Per Day/30 Days Discharge Instructions: Apply over primary dressing as directed. Secondary Dressing: ABD Pad, 8x10 1 x Per Day/30 Days Discharge Instructions: Apply over primary dressing as directed. Secured With: Elastic Bandage 4 inch (ACE bandage) 1 x Per Day/30 Days Discharge Instructions: Secure with ACE bandage as directed. Secured With: [employer], 4.5x3.1 (in/yd) 1 x Per Day/30 Days Discharge Instructions: Secure with Kerlix as directed. Secured With: Transpore Surgical Tape, 2x10 (in/yd) 1 x Per Day/30 Days Discharge Instructions: Secure dressing with tape as directed. Wound #4 - Ankle Wound Laterality: Left, Lateral Cleanser: Soap and Water 1 x Per Day/30 Days Discharge Instructions: May shower and wash wound with dial antibacterial soap and water prior to dressing change. Cleanser: Wound Cleanser 1 x Per Day/30 Days Discharge Instructions: Cleanse the wound with wound cleanser prior to applying a clean dressing using gauze sponges, not tissue or cotton balls. Prim Dressing: KerraCel Ag Gelling Fiber Dressing, 4x5 in (silver alginate) 1 x Per Day/30 Days ary Discharge Instructions: Apply silver alginate to wound bed as instructed Secondary Dressing: Woven Gauze Sponge, Non-Sterile 4x4 in 1 x Per Day/30 Days Discharge Instructions: Apply over primary dressing as directed. Secondary  Dressing: ABD Pad, 8x10 1 x Per Day/30 Days Discharge Instructions: Apply over primary dressing as directed. Secured With: Elastic Bandage 4 inch (ACE bandage) 1 x Per Day/30 Days Discharge Instructions: Secure with ACE bandage as directed. Secured With: [employer], 4.5x3.1 (in/yd) 1 x Per Day/30 Days Discharge Instructions: Secure with Kerlix as directed. Secured With: Transpore Surgical Tape, 2x10 (in/yd) 1 x Per Day/30 Days Discharge Instructions: Secure dressing with tape as directed. Wound #5 - Foot Wound Laterality: Dorsal, Left Cleanser: Soap and Water 1 x Per Day/30 Days Discharge Instructions: May shower and wash wound with dial antibacterial soap and water prior to dressing change. Cleanser: Wound Cleanser 1 x Per Day/30 Days Discharge Instructions: Cleanse the wound with wound cleanser prior to applying a clean dressing using gauze sponges, not tissue or cotton balls. Prim Dressing: KerraCel Ag Gelling Fiber Dressing, 4x5 in (silver alginate) 1 x Per Day/30 Days ary Discharge Instructions: Apply silver alginate to wound bed as instructed Secondary Dressing: Woven Gauze Sponge, Non-Sterile 4x4 in 1 x Per Day/30 Days Discharge Instructions: Apply over primary dressing as directed. Secondary Dressing: ABD Pad, 8x10 1 x Per Day/30 Days Discharge Instructions: Apply over primary dressing as directed. Secured With: Elastic Bandage 4 inch (ACE bandage) 1 x Per Day/30 Days Discharge Instructions: Secure with ACE bandage as directed. Secured With: [employer], 4.5x3.1 (in/yd) 1 x Per Day/30 Days Discharge Instructions: Secure with Kerlix as directed. Secured With: Transpore Surgical Tape, 2x10 (in/yd) 1 x Per Day/30 Days Discharge Instructions: Secure dressing with tape as directed. Wound #6 - Foot Wound Laterality: Left, Medial Cleanser: Soap and Water 1 x Per Day/30 Days Discharge Instructions: May shower and wash wound with dial antibacterial soap and water prior to  dressing change. Cleanser: Wound Cleanser 1 x  Per Day/30 Days Discharge Instructions: Cleanse the wound with wound cleanser prior to applying a clean dressing using gauze sponges, not tissue or cotton balls. Prim Dressing: KerraCel Ag Gelling Fiber Dressing, 4x5 in (silver alginate) 1 x Per Day/30 Days ary Discharge Instructions: Apply silver alginate to wound bed as instructed Secondary Dressing: Woven Gauze Sponge, Non-Sterile 4x4 in 1 x Per Day/30 Days Discharge Instructions: Apply over primary dressing as directed. Secondary Dressing: ABD Pad, 8x10 1 x Per Day/30 Days Discharge Instructions: Apply over primary dressing as directed. Secured With: Elastic Bandage 4 inch (ACE bandage) 1 x Per Day/30 Days Discharge Instructions: Secure with ACE bandage as directed. Secured With: [employer], 4.5x3.1 (in/yd) 1 x Per Day/30 Days Discharge Instructions: Secure with Kerlix as directed. Secured With: Transpore Surgical Tape, 2x10 (in/yd) 1 x Per Day/30 Days Discharge Instructions: Secure dressing with tape as directed. Consults Vascular - Urgent Vascular Referral: Non healing bilateral foot wounds, abnormal arterial study results Electronic Signature(s) Signed: 02/22/2021 11:50:52 AM By: Kalman Shan DO Entered By: Kalman Shan on 02/22/2021 11:46:46 Prescription 02/22/2021 -------------------------------------------------------------------------------- Mikle Bosworth, Daneli L. Kalman Shan DO Patient Name: Provider: 03/01/1966 [id number] Date of Birth: NPI#: [id number] Sex: DEA #: [phone] Phone #: License #: [place] Patient Address: [address], [place] [address] [place], [place] [address] [phone] Allergies Motrin Provider's Orders Vascular - Urgent Vascular Referral: Non healing bilateral foot wounds, abnormal arterial study results Hand Signature: Date(s): Electronic  Signature(s) Signed: 02/22/2021 11:50:52 AM By: Kalman Shan DO Entered By: Kalman Shan on 02/22/2021 11:46:46 -------------------------------------------------------------------------------- Problem List Details Patient Name: Date of Service: [name], [name] 02/22/2021 9:00 A M Medical Record Number: [id number] Patient Account Number: [id number] Date of Birth/Sex: Treating RN: Jul 25, 1965 (55 y.o. 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 02/22/2021 No Yes L97.529 Non-pressure chronic ulcer of other part of left foot with unspecified severity 02/22/2021 No Yes I73.9 Peripheral vascular disease, unspecified 02/22/2021 No Yes E11.9 Type 2 diabetes mellitus without complications [id number] No Yes L93.2 Other local lupus erythematosus 02/22/2021 No Yes [id number] Chronic diastolic (congestive) heart failure 02/22/2021 No Yes I10 Essential (primary) hypertension 02/22/2021 No Yes E11.621 Type 2 diabetes mellitus with foot ulcer 02/22/2021 No Yes Inactive Problems Resolved Problems Electronic Signature(s) Signed: 02/22/2021 11:50:52 AM By: Kalman Shan DO Entered By: Kalman Shan on 02/22/2021 11:29:50 -------------------------------------------------------------------------------- Progress Note Details Patient Name: Date of Service: [name], [name] 02/22/2021 9:00 A M Medical Record Number: [id number] Patient Account Number: [id number] Date of Birth/Sex: Treating RN: Sep 25, 1965 (55 y.o. [name] Primary Care Provider: PA Haig Prophet, [place] Other Clinician: Referring Provider: Treating Provider/Extender: Darcey Nora in Treatment:  0 Subjective Chief Complaint Information obtained from Patient Bilateral feet wounds History of Present Illness (HPI) Admission 8/22 Ms. Tinamarie Bosco is a 55 year old female with a past medical history of diet-controlled type 2 diabetes, hypothyroidism, chronic diastolic heart failure, cutaneous lupus erythematosus and essential hypertension that presents to the clinic for a 39-monthhistory of worsening bilateral feet ulcers. She states that wounds to her feet have been an ongoing issue for several years. She states that with wound care they improve however they tend  to wax and wane. 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 She was discharged to a nursing facility and she was provided wound care for her bilateral feet ulcers at that time. She states they used Silvadene and silver alginate. She states that when she was discharged in April from the nursing facility her wounds had improved but were not fully closed. She currently uses Silvadene but has not been using silver alginate for several months due to running out. She reports significant decline in her wound healing over the last 4 months. She reports pain to the wound sites. She currently denies increased warmth erythema or purulent drainage. Patient History Information obtained from Patient. Allergies Motrin (Reaction: encephalopathy) Family History Unknown History. Social History Never smoker, Marital Status - Single, Alcohol Use - Never, Drug Use - No History, Caffeine Use - Rarely. Medical History Hematologic/Lymphatic Patient has history of Anemia Cardiovascular Patient has history of Arrhythmia - A-Fib, Congestive Heart Failure, Hypertension Denies history of Angina Endocrine Patient has history of Type II Diabetes Immunological Patient has history of Lupus Erythematosus Patient is treated with Controlled Diet. Blood sugar is not tested. Medical A Surgical History  Notes nd Endocrine Grave's disease Review of Systems (ROS) Constitutional Symptoms (General Health) Denies complaints or symptoms of Fatigue, Fever, Chills, Marked Weight Change. Eyes Denies complaints or symptoms of Dry Eyes, Vision Changes, Glasses / Contacts. Ear/Nose/Mouth/Throat Denies complaints or symptoms of Chronic sinus problems or rhinitis. Respiratory Denies complaints or symptoms of Chronic or frequent coughs, Shortness of Breath. Gastrointestinal Denies complaints or symptoms of Frequent diarrhea, Nausea, Vomiting. Genitourinary Denies complaints or symptoms of Frequent urination. Integumentary (Skin) Complains or has symptoms of Wounds - wounds on. Musculoskeletal Denies complaints or symptoms of Muscle Pain, Muscle Weakness. Neurologic Denies complaints or symptoms of Numbness/parasthesias. Psychiatric Denies complaints or symptoms of Claustrophobia, Suicidal. Objective Constitutional respirations regular, non-labored and within target range for patient.. Vitals Time Taken: 9:03 AM, Height: 63 in, Source: Stated, Weight: 157 lbs, Source: Stated, BMI: 27.8, Temperature: 98.7 F, Pulse: 81 bpm, Respiratory Rate: 18 breaths/min, Blood Pressure: 130/77 mmHg. Psychiatric pleasant and cooperative. General Notes: Right lower extremity: T the medial ankle and dorsum of the right foot she has open wounds with necrotic debris and some granulation tissue o present. Left lower extremity: T the left medial ankle she has a small open wound with nonviable tissue and granulation tissue present. T the left lateral ankle o o she has a large open wound with granulation tissue and nonviable tissue present. T the left dorsum of the foot she has significant necrotic debris. T the o o medial left foot she has small open wounds with granulation tissue and nonviable tissue present. No obvious signs of infection to all wound sites. Difficult to palpate pedal pulses Integumentary (Hair,  Skin) Wound #1 status is Open. Original cause of wound was Gradually Appeared. The date acquired was: 04/03/2020. The wound is located on the Right,Medial Ankle. The wound measures 5cm length x 4.2cm width x 0.2cm depth; 16.493cm^2 area and 3.299cm^3 volume. There is Fat Layer (Subcutaneous Tissue) exposed. There is no tunneling or undermining noted. There is a medium amount of serosanguineous drainage noted. The wound margin is flat and intact. There is small (1-33%) red granulation within the wound bed. There is a large (67-100%) amount of necrotic tissue within the wound bed including Eschar and Adherent Slough. Wound #2 status is Open. Original cause of wound was Gradually Appeared. The date acquired was: 04/03/2020. The wound  is located on the Right,Dorsal Foot. The wound measures 3cm length x 5cm width x 0.1cm depth; 11.781cm^2 area and 1.178cm^3 volume. There is Fat Layer (Subcutaneous Tissue) exposed. There is no tunneling or undermining noted. There is a medium amount of serosanguineous drainage noted. The wound margin is flat and intact. There is medium (34-66%) red granulation within the wound bed. There is a medium (34-66%) amount of necrotic tissue within the wound bed including Adherent Slough. Wound #3 status is Open. Original cause of wound was Gradually Appeared. The date acquired was: 02/22/2021. The wound is located on the Left,Medial Ankle. The wound measures 1cm length x 2.5cm width x 0.2cm depth; 1.963cm^2 area and 0.393cm^3 volume. There is Fat Layer (Subcutaneous Tissue) exposed. There is no tunneling or undermining noted. There is a medium amount of serosanguineous drainage noted. The wound margin is flat and intact. There is large (67-100%) red granulation within the wound bed. There is a small (1-33%) amount of necrotic tissue within the wound bed including Adherent Slough. Wound #4 status is Open. Original cause of wound was Gradually Appeared. The date acquired was: 04/03/2020.  The wound is located on the Left,Lateral Ankle. The wound measures 8cm length x 6cm width x 0.2cm depth; 37.699cm^2 area and 7.54cm^3 volume. There is Fat Layer (Subcutaneous Tissue) exposed. There is no tunneling or undermining noted. There is a medium amount of serosanguineous drainage noted. The wound margin is flat and intact. There is small (1-33%) red granulation within the wound bed. There is a large (67-100%) amount of necrotic tissue within the wound bed including Adherent Slough. Wound #5 status is Open. Original cause of wound was Gradually Appeared. The date acquired was: 04/03/2020. The wound is located on the Left,Dorsal Foot. The wound measures 9cm length x 8.5cm width x 0.1cm depth; 60.083cm^2 area and 6.008cm^3 volume. There is no tunneling or undermining noted. There is a medium amount of purulent drainage noted. The wound margin is distinct with the outline attached to the wound base. There is no granulation within the wound bed. There is a large (67-100%) amount of necrotic tissue within the wound bed including Eschar and Adherent Slough. Wound #6 status is Open. Original cause of wound was Gradually Appeared. The date acquired was: 04/03/2020. The wound is located on the Left,Medial Foot. The wound measures 0.5cm length x 0.8cm width x 0.1cm depth; 0.314cm^2 area and 0.031cm^3 volume. There is Fat Layer (Subcutaneous Tissue) exposed. There is no tunneling or undermining noted. There is a medium amount of serosanguineous drainage noted. The wound margin is flat and intact. There is large (67-100%) red, pink granulation within the wound bed. There is no necrotic tissue within the wound bed. 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 There is significant nonviable tissue noted on exam however patient has increased pain and will not tolerate debridement at this time. The distribution of the wounds do concern me for an arterial cause. She had ABIs done on 04/29/2020 that showed ABI on the right of 1.0 and left ABI of 1.1 however she does have abnormal monophasic posterior tibial artery waveforms on both left and right lower extremities. I would like  for blood status to be assessed by vein and vascular. We will put in a referral for this today. In the meantime I recommended using silver alginate to the wound beds. At some point the necrotic debris will have to be removed. She may benefit from [place] down the road. 46 minutes was spent on the encounter including face-to-face, EMR review and coordination of care Procedures Wound #1 Pre-procedure diagnosis of Wound #1 is a Diabetic Wound/Ulcer of the Lower Extremity located on the Right,Medial Ankle .Severity of Tissue Pre Debridement is: Fat layer exposed. There was a Chemical/Enzymatic/Mechanical debridement performed by Kalman Shan, DO. With the following instrument(s): Gauze after achieving pain control using Other (Benzocaine). Other agent used was Gauze, Anasept. A time out was conducted at 10:50, prior to the start of the procedure. A Minimum amount of bleeding was controlled with Pressure. The procedure was tolerated well. Post Debridement Measurements: 5cm length x 4.2cm width x 0.2cm depth; 3.299cm^3 volume. Character of Wound/Ulcer Post Debridement is stable. Severity of Tissue Post Debridement is: Fat layer exposed. Post procedure Diagnosis Wound #1: Same as Pre-Procedure Wound #2 Pre-procedure diagnosis of Wound #2 is a Diabetic Wound/Ulcer of the Lower Extremity  located on the Right,Dorsal Foot .Severity of Tissue Pre Debridement is: Fat layer exposed. There was a Chemical/Enzymatic/Mechanical debridement performed by Kalman Shan, DO. With the following instrument(s): Gauze after achieving pain control using Other (Benzocaine). Other agent used was Gauze, Anasept. A time out was conducted at 10:50, prior to the start of the procedure. A Minimum amount of bleeding was controlled with Pressure. The procedure was tolerated well. Post Debridement Measurements: 3cm length x 5cm width x 0.1cm depth; 1.178cm^3 volume. Character of Wound/Ulcer Post Debridement is stable. Severity of Tissue Post Debridement is: Fat layer exposed. Post procedure Diagnosis Wound #2: Same as Pre-Procedure Wound #3 Pre-procedure diagnosis of Wound #3 is a Diabetic Wound/Ulcer of the Lower Extremity located on the Left,Medial Ankle .Severity of Tissue Pre Debridement is: Fat layer exposed. There was a Chemical/Enzymatic/Mechanical debridement performed by Kalman Shan, DO. With the following instrument(s): Gauze after achieving pain control using Other (Benzocaine). Other agent used was Gauze, Anasept. A time out was conducted at 10:50, prior to the start of the procedure. A Minimum amount of bleeding was controlled with Pressure. The procedure was tolerated well. Post Debridement Measurements: 1cm length x 2.5cm width x 0.2cm depth; 0.393cm^3 volume. Character of Wound/Ulcer Post Debridement is stable. Severity of Tissue Post Debridement is: Fat layer exposed. Post procedure Diagnosis Wound #3: Same as Pre-Procedure Wound #4 Pre-procedure diagnosis of Wound #4 is a Diabetic Wound/Ulcer of the Lower Extremity located on the Left,Lateral Ankle .Severity of Tissue Pre Debridement is: Fat layer exposed. There was a Chemical/Enzymatic/Mechanical debridement performed by Kalman Shan, DO. With the following instrument(s): Gauze after achieving pain control using Other  (Benzocaine). Other agent used was Gauze, Anasept. A time out was conducted at 10:50, prior to the start of the procedure. A Minimum amount of bleeding was controlled with Pressure. The procedure was tolerated well. Post Debridement Measurements: 8cm length x 6cm width x 0.2cm depth; 7.54cm^3 volume. Character of Wound/Ulcer Post Debridement is stable. Severity of Tissue Post Debridement is: Fat layer exposed. Post procedure Diagnosis Wound #4: Same as Pre-Procedure Wound #5 Pre-procedure diagnosis of Wound #5 is a Diabetic Wound/Ulcer of the Lower Extremity located on the Left,Dorsal Foot .Severity of Tissue Pre Debridement is: Fat layer exposed. There was a Chemical/Enzymatic/Mechanical debridement performed by Kalman Shan, DO. With the following instrument(s): Gauze  after achieving pain control using Other (Benzocaine). Other agent used was Gauze, Anasept. A time out was conducted at 10:50, prior to the start of the procedure. A Minimum amount of bleeding was controlled with Pressure. The procedure was tolerated well. Post Debridement Measurements: 9cm length x 8.5cm width x 0.1cm depth; 6.008cm^3 volume. Character of Wound/Ulcer Post Debridement is stable. Severity of Tissue Post Debridement is: Fat layer exposed. Post procedure Diagnosis Wound #5: Same as Pre-Procedure Wound #6 Pre-procedure diagnosis of Wound #6 is a Diabetic Wound/Ulcer of the Lower Extremity located on the Left,Medial Foot .Severity of Tissue Pre Debridement is: Fat layer exposed. There was a Chemical/Enzymatic/Mechanical debridement performed by Kalman Shan, DO. With the following instrument(s): Gauze after achieving pain control using Other (Benzocaine). Other agent used was Gauze, Anasept. A time out was conducted at 10:50, prior to the start of the procedure. A Minimum amount of bleeding was controlled with Pressure. The procedure was tolerated well. Post Debridement Measurements: 0.5cm length x 0.8cm width x  0.1cm depth; 0.031cm^3 volume. Character of Wound/Ulcer Post Debridement is stable. Severity of Tissue Post Debridement is: Fat layer exposed. Post procedure Diagnosis Wound #6: Same as Pre-Procedure Plan Follow-up Appointments: Return Appointment in 1 week. - with Dr. Heber Rush Other: - [facility]=Supplies Bathing/ Shower/ Hygiene: May shower and wash wound with soap and water. - when dressings changed Edema Control - Lymphedema / SCD / Other: Elevate legs to the level of the heart or above for 30 minutes daily and/or when sitting, a frequency of: Avoid standing for long periods of time. Additional Orders / Instructions: Follow Nutritious Diet - 100-120g of protein daily Home Health: Admit to [facility] for wound care. May utilize formulary equivalent dressing for wound treatment orders unless otherwise specified. - 02/22/21: Will submit order to [facility] ordered were: Vascular - Urgent Vascular Referral: Non healing bilateral foot wounds, abnormal arterial study results WOUND #1: - Ankle Wound Laterality: Right, Medial Cleanser: Soap and Water 1 x Per Day/30 Days Discharge Instructions: May shower and wash wound with dial antibacterial soap and water prior to dressing change. Cleanser: Wound Cleanser 1 x Per Day/30 Days Discharge Instructions: Cleanse the wound with wound cleanser prior to applying a clean dressing using gauze sponges, not tissue or cotton balls. Prim Dressing: KerraCel Ag Gelling Fiber Dressing, 4x5 in (silver alginate) 1 x Per Day/30 Days ary Discharge Instructions: Apply silver alginate to wound bed as instructed Secondary Dressing: Woven Gauze Sponge, Non-Sterile 4x4 in 1 x Per Day/30 Days Discharge Instructions: Apply over primary dressing as directed. Secondary Dressing: ABD Pad, 8x10 1 x Per Day/30 Days Discharge Instructions: Apply over primary dressing as directed. Secured With: Elastic Bandage 4 inch (ACE bandage) 1 x Per Day/30 Days Discharge  Instructions: Secure with ACE bandage as directed. Secured With: [employer], 4.5x3.1 (in/yd) 1 x Per Day/30 Days Discharge Instructions: Secure with Kerlix as directed. Secured With: Transpore Surgical T ape, 2x10 (in/yd) 1 x Per Day/30 Days Discharge Instructions: Secure dressing with tape as directed. WOUND #2: - Foot Wound Laterality: Dorsal, Right Cleanser: Soap and Water 1 x Per Day/30 Days Discharge Instructions: May shower and wash wound with dial antibacterial soap and water prior to dressing change. Cleanser: Wound Cleanser 1 x Per Day/30 Days Discharge Instructions: Cleanse the wound with wound cleanser prior to applying a clean dressing using gauze sponges, not tissue or cotton balls. Prim Dressing: KerraCel Ag Gelling Fiber Dressing, 4x5 in (silver alginate) 1 x Per Day/30 Days ary Discharge  Instructions: Apply silver alginate to wound bed as instructed Secondary Dressing: Woven Gauze Sponge, Non-Sterile 4x4 in 1 x Per Day/30 Days Discharge Instructions: Apply over primary dressing as directed. Secondary Dressing: ABD Pad, 8x10 1 x Per Day/30 Days Discharge Instructions: Apply over primary dressing as directed. Secured With: Elastic Bandage 4 inch (ACE bandage) 1 x Per Day/30 Days Discharge Instructions: Secure with ACE bandage as directed. Secured With: [employer], 4.5x3.1 (in/yd) 1 x Per Day/30 Days Discharge Instructions: Secure with Kerlix as directed. Secured With: Transpore Surgical T ape, 2x10 (in/yd) 1 x Per Day/30 Days Discharge Instructions: Secure dressing with tape as directed. WOUND #3: - Ankle Wound Laterality: Left, Medial Cleanser: Soap and Water 1 x Per Day/30 Days Discharge Instructions: May shower and wash wound with dial antibacterial soap and water prior to dressing change. Cleanser: Wound Cleanser 1 x Per Day/30 Days Discharge Instructions: Cleanse the wound with wound cleanser prior to applying a clean dressing using gauze sponges, not  tissue or cotton balls. Prim Dressing: KerraCel Ag Gelling Fiber Dressing, 4x5 in (silver alginate) 1 x Per Day/30 Days ary Discharge Instructions: Apply silver alginate to wound bed as instructed Secondary Dressing: Woven Gauze Sponge, Non-Sterile 4x4 in 1 x Per Day/30 Days Discharge Instructions: Apply over primary dressing as directed. Secondary Dressing: ABD Pad, 8x10 1 x Per Day/30 Days Discharge Instructions: Apply over primary dressing as directed. Secured With: Elastic Bandage 4 inch (ACE bandage) 1 x Per Day/30 Days Discharge Instructions: Secure with ACE bandage as directed. Secured With: [employer], 4.5x3.1 (in/yd) 1 x Per Day/30 Days Discharge Instructions: Secure with Kerlix as directed. Secured With: Transpore Surgical T ape, 2x10 (in/yd) 1 x Per Day/30 Days Discharge Instructions: Secure dressing with tape as directed. WOUND #4: - Ankle Wound Laterality: Left, Lateral Cleanser: Soap and Water 1 x Per Day/30 Days Discharge Instructions: May shower and wash wound with dial antibacterial soap and water prior to dressing change. Cleanser: Wound Cleanser 1 x Per Day/30 Days Discharge Instructions: Cleanse the wound with wound cleanser prior to applying a clean dressing using gauze sponges, not tissue or cotton balls. Prim Dressing: KerraCel Ag Gelling Fiber Dressing, 4x5 in (silver alginate) 1 x Per Day/30 Days ary Discharge Instructions: Apply silver alginate to wound bed as instructed Secondary Dressing: Woven Gauze Sponge, Non-Sterile 4x4 in 1 x Per Day/30 Days Discharge Instructions: Apply over primary dressing as directed. Secondary Dressing: ABD Pad, 8x10 1 x Per Day/30 Days Discharge Instructions: Apply over primary dressing as directed. Secured With: Elastic Bandage 4 inch (ACE bandage) 1 x Per Day/30 Days Discharge Instructions: Secure with ACE bandage as directed. Secured With: [employer], 4.5x3.1 (in/yd) 1 x Per Day/30 Days Discharge Instructions:  Secure with Kerlix as directed. Secured With: Transpore Surgical T ape, 2x10 (in/yd) 1 x Per Day/30 Days Discharge Instructions: Secure dressing with tape as directed. WOUND #5: - Foot Wound Laterality: Dorsal, Left Cleanser: Soap and Water 1 x Per Day/30 Days Discharge Instructions: May shower and wash wound with dial antibacterial soap and water prior to dressing change. Cleanser: Wound Cleanser 1 x Per Day/30 Days Discharge Instructions: Cleanse the wound with wound cleanser prior to applying a clean dressing using gauze sponges, not tissue or cotton balls. Prim Dressing: KerraCel Ag Gelling Fiber Dressing, 4x5 in (silver alginate) 1 x Per Day/30 Days ary Discharge Instructions: Apply silver alginate to wound bed as instructed Secondary Dressing: Woven Gauze Sponge, Non-Sterile 4x4 in 1 x Per Day/30 Days Discharge Instructions: Apply  over primary dressing as directed. Secondary Dressing: ABD Pad, 8x10 1 x Per Day/30 Days Discharge Instructions: Apply over primary dressing as directed. Secured With: Elastic Bandage 4 inch (ACE bandage) 1 x Per Day/30 Days Discharge Instructions: Secure with ACE bandage as directed. Secured With: [employer], 4.5x3.1 (in/yd) 1 x Per Day/30 Days Discharge Instructions: Secure with Kerlix as directed. Secured With: [employer] Surgical T ape, 2x10 (in/yd) 1 x Per Day/30 Days Discharge Instructions: Secure dressing with tape as directed. WOUND #6: - Foot Wound Laterality: Left, Medial Cleanser: Soap and Water 1 x Per Day/30 Days Discharge Instructions: May shower and wash wound with dial antibacterial soap and water prior to dressing change. Cleanser: Wound Cleanser 1 x Per Day/30 Days Discharge Instructions: Cleanse the wound with wound cleanser prior to applying a clean dressing using gauze sponges, not tissue or cotton balls. Prim Dressing: KerraCel Ag Gelling Fiber Dressing, 4x5 in (silver alginate) 1 x Per Day/30 Days ary Discharge Instructions:  Apply silver alginate to wound bed as instructed Secondary Dressing: Woven Gauze Sponge, Non-Sterile 4x4 in 1 x Per Day/30 Days Discharge Instructions: Apply over primary dressing as directed. Secondary Dressing: ABD Pad, 8x10 1 x Per Day/30 Days Discharge Instructions: Apply over primary dressing as directed. Secured With: Elastic Bandage 4 inch (ACE bandage) 1 x Per Day/30 Days Discharge Instructions: Secure with ACE bandage as directed. Secured With: [employer], 4.5x3.1 (in/yd) 1 x Per Day/30 Days Discharge Instructions: Secure with Kerlix as directed. Secured With: [employer] T ape, 2x10 (in/yd) 1 x Per Day/30 Days Discharge Instructions: Secure dressing with tape as directed. 1. Continue silver alginate 2. Referral to vein and vascular 3. Follow-up in 1 week Electronic Signature(s) Signed: 02/22/2021 11:50:52 AM By: Kalman Shan DO Entered By: Kalman Shan on 02/22/2021 11:49:47 -------------------------------------------------------------------------------- HxROS Details Patient Name: Date of Service: [name], [name] 02/22/2021 9:00 A M Medical Record Number: [id number] Patient Account Number: [id number] Date of Birth/Sex: Treating RN: 12/23/1965 (55 y.o. [name] Primary Care Provider: PA Haig Prophet, [place] Other Clinician: Referring Provider: Treating Provider/Extender: Darcey Nora in Treatment: 0 Information Obtained From Patient Constitutional Symptoms (General Health) Complaints and Symptoms: Negative for: Fatigue; Fever; Chills; Marked Weight Change Eyes Complaints and Symptoms: Negative for: Dry Eyes; Vision Changes; Glasses / Contacts Ear/Nose/Mouth/Throat Complaints and Symptoms: Negative for: Chronic sinus problems or rhinitis Respiratory Complaints and Symptoms: Negative for: Chronic or frequent coughs; Shortness of Breath Gastrointestinal Complaints and Symptoms: Negative for: Frequent  diarrhea; Nausea; Vomiting Genitourinary Complaints and Symptoms: Negative for: Frequent urination Integumentary (Skin) Complaints and Symptoms: Positive for: Wounds - wounds on Musculoskeletal Complaints and Symptoms: Negative for: Muscle Pain; Muscle Weakness Neurologic Complaints and Symptoms: Negative for: Numbness/parasthesias Psychiatric Complaints and Symptoms: Negative for: Claustrophobia; Suicidal Hematologic/Lymphatic Medical History: Positive for: Anemia Cardiovascular Medical History: Positive for: Arrhythmia - A-Fib; Congestive Heart Failure; Hypertension Negative for: Angina Endocrine Medical History: Positive for: Type II Diabetes Past Medical History Notes: Grave's disease Time with diabetes: over 10 years Treated with: Diet Blood sugar tested every day: No Immunological Medical History: Positive for: Lupus Erythematosus Oncologic Immunizations Pneumococcal Vaccine: Received Pneumococcal Vaccination: No Implantable Devices None Family and Social History Unknown History: Yes; Never smoker; Marital Status - Single; Alcohol Use: Never; Drug Use: No History; Caffeine Use: Rarely; Financial Concerns: No; Food, Clothing or Shelter Needs: No; Support System Lacking: No; Transportation Concerns: No Electronic Signature(s) Signed: 02/22/2021 11:50:52 AM By: Kalman Shan DO Signed: 02/22/2021 5:12:26 PM By:  Levan Hurst RN, BSN Entered By: Levan Hurst on 02/22/2021 09:33:02 -------------------------------------------------------------------------------- SuperBill Details Patient Name: Date of Service: [name], [name] 02/22/2021 Medical Record Number: [id number] Patient Account Number: [id number] Date of Birth/Sex: Treating RN: 1965-08-10 (55 y.o. 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 I73.9 Peripheral vascular disease, unspecified E11.9 Type 2 diabetes mellitus without complications [id number] Other local lupus erythematosus [id number] Chronic diastolic (congestive) heart failure I10 Essential (primary) hypertension E11.621 Type 2 diabetes mellitus with foot ulcer Facility Procedures CPT4 Code: [id number] Description: [facility] VISIT-LEV 3 EST PT Modifier: 25 Quantity: 1 CPT4 Code: [id number] Description: [id number] - DEBRIDE W/O ANES NON SELECT Modifier: Quantity: 1 Physician Procedures : CPT4 Code Description Modifier [id number] - WC PHYS LEVEL 4 - NEW PT ICD-10 Diagnosis Description 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

## 2021-02-22 NOTE — Progress Notes (Signed)
Candace Robinson, Candace Robinson ([id number]) Visit Report for 02/22/2021 Abuse/Suicide Risk Screen Details Patient Name: Date of Service: [name], [name] 02/22/2021 9:00 A M Medical Record Number: [id number] Patient Account Number: [id number] Date of Birth/Sex: Treating RN: [birth year] (55 y.o. [name] Primary Care Linsie Lupo: PA Haig Prophet, NO Other Clinician: Referring Diar Berkel: Treating Markus Casten/Extender: Darcey Nora in Treatment: 0 Abuse/Suicide Risk Screen Items Answer ABUSE RISK SCREEN: Has anyone close to you tried to hurt or harm you recentlyo No Do you feel uncomfortable with anyone in your familyo No Has anyone forced you do things that you didnt want to doo No Electronic Signature(s) Signed: 02/22/2021 5:12:26 PM By: Levan Hurst RN, BSN Entered By: Levan Hurst on 02/22/2021 09:33:17 -------------------------------------------------------------------------------- Activities of Daily Living Details Patient Name: Date of Service: [name], [name] 02/22/2021 9:00 A M Medical Record Number: [id number] Patient Account Number: [id number] Date of Birth/Sex: Treating RN: 01/31/1966 (55 y.o. [name] Primary Care Aiana Nordquist: PA Haig Prophet, [place] Other Clinician: Referring Dionte Blaustein: Treating Ava Deguire/Extender: Darcey Nora in Treatment: 0 Activities of Daily Living Items Answer Activities of Daily Living (Please select one for each item) Drive Automobile Not Able T Medications ake Completely Able Use T elephone Completely Able Care for Appearance Completely Able Use T oilet Completely Able Bath / Shower Completely Able Dress Self Completely Able Feed Self Completely Able Walk Completely Able Get In / Out Bed Completely Able Housework Completely Able Prepare Meals Completely [place] for Self Need Assistance Electronic Signature(s) Signed: 02/22/2021 5:12:26 PM By: Levan Hurst  RN, BSN Entered By: Levan Hurst on 02/22/2021 09:49:56 -------------------------------------------------------------------------------- Education Screening Details Patient Name: Date of Service: [name], [name] 02/22/2021 9:00 A M Medical Record Number: [id number] Patient Account Number: [id number] Date of Birth/Sex: Treating RN: [birth year] (55 y.o. [name] Primary Care Denesha Brouse: PA Haig Prophet, [place] Other Clinician: Referring Rishita Petron: Treating Melaney Tellefsen/Extender: Darcey Nora in Treatment: 0 Primary Learner Assessed: Patient Learning Preferences/Education Level/Primary Language Learning Preference: Explanation, Printed Material Highest Education Level: College or Above Preferred Language: English Cognitive Barrier Language Barrier: No Translator Needed: No Memory Deficit: No Emotional Barrier: No Cultural/Religious Beliefs Affecting Medical Care: No Physical Barrier Impaired Vision: No Impaired Hearing: No Decreased Hand dexterity: No Knowledge/Comprehension Knowledge Level: High Comprehension Level: High Ability to understand written instructions: High Ability to understand verbal instructions: High Motivation Anxiety Level: Calm Cooperation: Cooperative Education Importance: Acknowledges Need Interest in Health Problems: Asks Questions Perception: Coherent Willingness to Engage in Self-Management High Activities: Readiness to Engage in Self-Management High Activities: Electronic Signature(s) Signed: 02/22/2021 5:12:26 PM By: Levan Hurst RN, BSN Entered By: Levan Hurst on 02/22/2021 09:50:14 -------------------------------------------------------------------------------- Fall Risk Assessment Details Patient Name: Date of Service: [name], [name] 02/22/2021 9:00 A M Medical Record Number: [id number] Patient Account Number: [id number] Date of Birth/Sex: Treating RN: January 10, 1966 (54 y.o. [name] Primary  Care Ameia Morency: PA TIENT, NO Other Clinician: Referring Cyniah Gossard: Treating Caroleann Casler/Extender: Darcey Nora in Treatment: 0 Fall Risk Assessment Items Have you had 2 or more falls in the last 12 monthso 0 No Have you had any fall that resulted in injury in the last 12 monthso 0 No FALLS RISK SCREEN History of falling - immediate or within 3 months 0 No Secondary diagnosis (Do you have 2 or more medical diagnoseso) 0 No Ambulatory aid None/bed rest/wheelchair/nurse 0  Yes Crutches/cane/walker 0 No Furniture 0 No Intravenous therapy Access/Saline/Heparin Lock 0 No Gait/Transferring Normal/ bed rest/ wheelchair 0 Yes Weak (short steps with or without shuffle, stooped but able to lift head while walking, may seek 0 No support from furniture) Impaired (short steps with shuffle, may have difficulty arising from chair, head down, impaired 0 No balance) Mental Status Oriented to own ability 0 Yes Electronic Signature(s) Signed: 02/22/2021 5:12:26 PM By: Levan Hurst RN, BSN Entered By: Levan Hurst on 02/22/2021 09:50:23 -------------------------------------------------------------------------------- Foot Assessment Details Patient Name: Date of Service: [name], [name] 02/22/2021 9:00 A M Medical Record Number: [id number] Patient Account Number: [id number] Date of Birth/Sex: Treating RN: 07-Jun-1966 (55 y.o. [name] Primary Care Roxy Mastandrea: PA TIENT, NO Other Clinician: Referring Louetta Hollingshead: Treating Florinda Taflinger/Extender: Darcey Nora in Treatment: 0 Foot Assessment Items Site Locations + = Sensation present, - = Sensation absent, C = Callus, U = Ulcer R = Redness, W = Warmth, M = Maceration, PU = Pre-ulcerative lesion F = Fissure, S = Swelling, D = Dryness Assessment Right: Left: Other Deformity: No No Prior Foot Ulcer: No No Prior Amputation: No No Charcot Joint: No No Ambulatory Status: Ambulatory With  Help Assistance Device: Walker GaitEnergy manager) Signed: 02/22/2021 5:12:26 PM By: Levan Hurst RN, BSN Entered By: Levan Hurst on 02/22/2021 09:51:29 -------------------------------------------------------------------------------- Nutrition Risk Screening Details Patient Name: Date of Service: [name], [name] 02/22/2021 9:00 A M Medical Record Number: [id number] Patient Account Number: [id number] Date of Birth/Sex: Treating RN: 13-Aug-1965 (55 y.o. [name] Primary Care Halimah Bewick: PA Haig Prophet, NO Other Clinician: Referring Sonda Coppens: Treating Asir Bingley/Extender: Darcey Nora in Treatment: 0 Height (in): 63 Weight (lbs): 157 Body Mass Index (BMI): 27.8 Nutrition Risk Screening Items Score Screening NUTRITION RISK SCREEN: I have an illness or condition that made me change the kind and/or amount of food I eat 2 Yes I eat fewer than two meals per day 0 No I eat few fruits and vegetables, or milk products 0 No I have three or more drinks of beer, liquor or wine almost every day 0 No I have tooth or mouth problems that make it hard for me to eat 0 No I don't always have enough money to buy the food I need 0 No I eat alone most of the time 0 No I take three or more different prescribed or over-the-counter drugs a day 1 Yes Without wanting to, I have lost or gained 10 pounds in the last six months 0 No I am not always physically able to shop, cook and/or feed myself 0 No Nutrition Protocols Good Risk Protocol Moderate Risk Protocol 0 Provide education on nutrition High Risk Proctocol Risk Level: Moderate Risk Score: 3 Electronic Signature(s) Signed: 02/22/2021 5:12:26 PM By: Levan Hurst RN, BSN Entered By: Levan Hurst on 02/22/2021 09:50:31

## 2021-02-24 NOTE — Progress Notes (Signed)
Candace Robinson, Candace Robinson ([id number]) Visit Report for 02/22/2021 Allergy List Details Patient Name: Date of Service: Candace A DNA X, [name] 02/22/2021 9:00 A M Medical Record Number: [id number] Patient Account Number: [id number] Date of Birth/Sex: Treating RN: 03-06-1966 (55 y.o. Candace Robinson Primary Care Candace Robinson: PA Candace Robinson, [place] Other Clinician: Referring Candace Robinson: Treating Candace Robinson/Extender: Candace Robinson in Treatment: 0 Allergies Active Allergies Motrin Reaction: encephalopathy Allergy Notes Electronic Signature(s) Signed: 02/22/2021 5:12:26 PM By: Candace Hurst RN, BSN Entered By: Candace Robinson on 02/22/2021 09:26:20 -------------------------------------------------------------------------------- Arrival Information Details Patient Name: Date of Service: Candace A Candace Passe, [name] 02/22/2021 9:00 A M Medical Record Number: [id number] Patient Account Number: [id number] Date of Birth/Sex: Treating RN: [birth year] (55 y.o. Candace Robinson Primary Care Sheriece Jefcoat: PA Candace Robinson, [place] Other Clinician: Referring [name]: Treating Candace Robinson/Extender: Candace Robinson in Treatment: 0 Visit Information Patient Arrived: Walker Arrival Time: 09:03 Accompanied By: self Transfer Assistance: None Patient Identification Verified: Yes Secondary Verification Process Completed: Yes Patient Requires Transmission-Based Precautions: No Patient Has Alerts: Yes Patient Alerts: R ABI: 1.0 L ABI: 1.1 Electronic Signature(s) Signed: 02/22/2021 5:12:26 PM By: Candace Hurst RN, BSN Entered By: Candace Robinson on 02/22/2021 09:25:25 -------------------------------------------------------------------------------- Clinic Level of Care Assessment Details Patient Name: Date of Service: Candace A DNA X, [name] 02/22/2021 9:00 A M Medical Record Number: [id number] Patient Account Number: [id number] Date of Birth/Sex: Treating RN: 02-Jun-1966 (55 y.o. Candace Robinson Primary Care [name]: PA Candace Robinson, [place] Other Clinician: Referring Tanekia Ryans: Treating [name]/Extender: Candace Robinson in Treatment: 0 Clinic Level of Care Assessment Items TOOL 1 Quantity Score X- 1 0 Use when EandM and Procedure is performed on INITIAL visit ASSESSMENTS - Nursing Assessment / Reassessment X- 1 20 General Physical Exam (combine w/ comprehensive assessment (listed just below) when performed on new pt. evals) X- 1 25 Comprehensive Assessment (HX, ROS, Risk Assessments, Wounds Hx, etc.) ASSESSMENTS - Wound and Skin Assessment / Reassessment '[]'$  - 0 Dermatologic / Skin Assessment (not related to wound area) ASSESSMENTS - Ostomy and/or Continence Assessment and Care '[]'$  - 0 Incontinence Assessment and Management '[]'$  - 0 Ostomy Care Assessment and Management (repouching, etc.) PROCESS - Coordination of Care '[]'$  - 0 Simple Patient / Family Education for ongoing care X- 1 20 Complex (extensive) Patient / Family Education for ongoing care X- 1 10 Staff obtains Programmer, systems, Records, T Results / Process Orders est X- 1 10 Staff telephones HHA, Nursing Homes / Clarify orders / etc '[]'$  - 0 Routine Transfer to another Facility (non-emergent condition) '[]'$  - 0 Routine Hospital Admission (non-emergent condition) '[]'$  - 0 New Admissions / Biomedical engineer / Ordering NPWT Apligraf, etc. , '[]'$  - 0 Emergency Hospital Admission (emergent condition) PROCESS - Special Needs '[]'$  - 0 Pediatric / Minor Patient Management '[]'$  - 0 Isolation Patient Management '[]'$  - 0 Hearing / Language / Visual special needs '[]'$  - 0 Assessment of Community assistance (transportation, D/C planning, etc.) '[]'$  - 0 Additional assistance / Altered mentation '[]'$  - 0 Support Surface(s) Assessment (bed, cushion, seat, etc.) INTERVENTIONS - Miscellaneous '[]'$  - 0 External ear exam '[]'$  - 0 Patient Transfer (multiple staff / Civil Service fast streamer / Similar devices) '[]'$  - 0 Simple Staple /  Suture removal (25 or less) '[]'$  - 0 Complex Staple / Suture removal (26 or more) '[]'$  - 0 Hypo/Hyperglycemic Management (do not check if billed separately) '[]'$  - 0 Ankle / Brachial Index (ABI) - do not check if  billed separately Has the patient been seen at the hospital within the last three years: Yes Total Score: 85 Level Of Care: New/Established - Level 3 Electronic Signature(s) Signed: 02/22/2021 5:26:48 PM By: Candace Robinson Entered By: Candace Robinson on 02/22/2021 11:35:32 -------------------------------------------------------------------------------- Encounter Discharge Information Details Patient Name: Date of Service: Candace A Candace Passe, [name] 02/22/2021 9:00 A M Medical Record Number: [id number] Patient Account Number: [id number] Date of Birth/Sex: Treating RN: [birth year] (56 y.o. Candace Robinson Primary Care Candace Robinson: PA Candace Robinson, [place] Other Clinician: Referring Candace Robinson: Treating Candace Robinson/Extender: Candace Robinson in Treatment: 0 Encounter Discharge Information Items Discharge Condition: Stable Ambulatory Status: Walker Discharge Destination: Home Transportation: Private Auto Accompanied By: self Schedule Follow-up Appointment: Yes Clinical Summary of Care: Patient Declined Electronic Signature(s) Signed: 02/22/2021 5:14:01 PM By: Candace Gouty RN, BSN Entered By: Candace Robinson on 02/22/2021 11:16:24 -------------------------------------------------------------------------------- Lower Extremity Assessment Details Patient Name: Date of Service: Candace A DNA X, [name] 02/22/2021 9:00 A M Medical Record Number: [id number] Patient Account Number: [id number] Date of Birth/Sex: Treating RN: 04-Apr-1966 (55 y.o. Candace Robinson Primary Care [name]: PA Candace Robinson, [place] Other Clinician: Referring [name]: Treating Candace Robinson/Extender: Candace Robinson in Treatment: 0 Edema Assessment Assessed: [Left: No] [Right: No] [name] [Right: :] Calf Left: Right: Point of Measurement: 29 cm From Medial Instep 36 cm 34.5 cm Ankle Left: Right: Point of Measurement: 10 cm From Medial Instep 23 cm 23 cm Vascular Assessment Pulses: Dorsalis Pedis Palpable: [Left:Yes] [Right:Yes] Electronic Signature(s) Signed: 02/22/2021 5:12:26 PM By: Candace Hurst RN, BSN Entered By: Candace Robinson on 02/22/2021 09:51:46 -------------------------------------------------------------------------------- Multi Wound Chart Details Patient Name: Date of Service: [name], [name] 02/22/2021 9:00 A M Medical Record Number: [id number] Patient Account Number: [id number] Date of Birth/Sex: Treating RN: 1966-04-20 (55 y.o. Candace Robinson Primary Care Rane Dumm: PA Candace Robinson, [place] Other Clinician: Referring Sevana Grandinetti: Treating Shauntea Lok/Extender: Candace Robinson in Treatment: 0 Vital Signs Height(in): 20 Pulse(bpm): 37 Weight(lbs): 157 Blood Pressure(mmHg): 130/77 Body Mass Index(BMI): 28 Temperature(F): 98.7 Respiratory Rate(breaths/min): 18 Photos: Right, Medial Ankle Right, Dorsal Foot Left, Medial Ankle Wound Location: Gradually Appeared Gradually Appeared Gradually Appeared Wounding Event: Diabetic Wound/Ulcer of the Lower Diabetic Wound/Ulcer of the Lower Diabetic Wound/Ulcer of the Lower Primary Etiology: Extremity Extremity Extremity Anemia, Arrhythmia, Congestive Heart Anemia, Arrhythmia, Congestive Heart Anemia, Arrhythmia, Congestive Heart Comorbid History: Failure, Hypertension, Type II Failure, Hypertension, Type II Failure, Hypertension, Type II Diabetes, Lupus Erythematosus Diabetes, Lupus Erythematosus Diabetes, Lupus Erythematosus 04/03/2020 04/03/2020 02/22/2021 Date Acquired: 0 0 0 Weeks of Treatment: Open Open Open Wound Status: 5x4.2x0.2 3x5x0.1 1x2.5x0.2 Measurements L x W x D (cm) 16.493 11.781 1.963 A (cm) : rea 3.299 1.178 0.393 Volume (cm) : 0.00% 0.00% 0.00% %  Reduction in A rea: 0.00% 0.00% 0.00% % Reduction in Volume: Grade 2 Grade 2 Grade 2 Classification: Medium Medium Medium Exudate A mount: Serosanguineous Serosanguineous Serosanguineous Exudate Type: red, brown red, brown red, brown Exudate Color: Flat and Intact Flat and Intact Flat and Intact Wound Margin: Small (1-33%) Medium (34-66%) Large (67-100%) Granulation A mount: Red Red Red Granulation Quality: Large (67-100%) Medium (34-66%) Small (1-33%) Necrotic A mount: Eschar, Adherent Hawley Adherent Slough Necrotic Tissue: Fat Layer (Subcutaneous Tissue): Yes Fat Layer (Subcutaneous Tissue): Yes Fat Layer (Subcutaneous Tissue): Yes Exposed Structures: Fascia: No Fascia: No Fascia: No Tendon: No Tendon: No Tendon: No Muscle: No Muscle: No Muscle: No Joint: No Joint: No Joint: No  Bone: No Bone: No Bone: No None None None Epithelialization: Chemical/Enzymatic/Mechanical Chemical/Enzymatic/Mechanical Chemical/Enzymatic/Mechanical Debridement: Pre-procedure Verification/Time Out 10:50 10:50 10:50 Taken: Other Other Other Pain Control: Other(Gauze) Other(Gauze) Other(Gauze) Instrument: Minimum Minimum Minimum Bleeding: Pressure Pressure Pressure Hemostasis A chieved: Procedure was tolerated well Procedure was tolerated well Procedure was tolerated well Debridement Treatment Response: 5x4.2x0.2 3x5x0.1 1x2.5x0.2 Post Debridement Measurements L x W x D (cm) 3.299 1.178 0.393 Post Debridement Volume: (cm) Debridement Debridement Debridement Procedures Performed: Wound Number: '4 5 6 '$ Photos: Left, Lateral Ankle Left, Dorsal Foot Left, Medial Foot Wound Location: Gradually Appeared Gradually Appeared Gradually Appeared Wounding Event: Diabetic Wound/Ulcer of the Lower Diabetic Wound/Ulcer of the Lower Diabetic Wound/Ulcer of the Lower Primary Etiology: Extremity Extremity Extremity Anemia, Arrhythmia, Congestive Heart Anemia, Arrhythmia,  Congestive Heart Anemia, Arrhythmia, Congestive Heart Comorbid History: Failure, Hypertension, Type II Failure, Hypertension, Type II Failure, Hypertension, Type II Diabetes, Lupus Erythematosus Diabetes, Lupus Erythematosus Diabetes, Lupus Erythematosus 04/03/2020 04/03/2020 04/03/2020 Date Acquired: 0 0 0 Weeks of Treatment: Open Open Open Wound Status: 8x6x0.2 9x8.5x0.1 0.5x0.8x0.1 Measurements L x W x D (cm) 37.699 60.083 0.314 A (cm) : rea 7.54 6.008 0.031 Volume (cm) : 0.00% 0.00% 0.00% % Reduction in A rea: 0.00% 0.00% 0.00% % Reduction in Volume: Grade 2 Unable to visualize wound bed Grade 2 Classification: Medium Medium Medium Exudate A mount: Serosanguineous Purulent Serosanguineous Exudate Type: red, brown yellow, brown, green red, brown Exudate Color: Flat and Intact Distinct, outline attached Flat and Intact Wound Margin: Small (1-33%) None Present (0%) Large (67-100%) Granulation A mount: Red N/A Red, Pink Granulation Quality: Large (67-100%) Large (67-100%) None Present (0%) Necrotic A mount: Adherent Slough Eschar, Adherent Slough N/A Necrotic Tissue: Fat Layer (Subcutaneous Tissue): Yes Fascia: No Fat Layer (Subcutaneous Tissue): Yes Exposed Structures: Fascia: No Fat Layer (Subcutaneous Tissue): No Fascia: No Tendon: No Tendon: No Tendon: No Muscle: No Muscle: No Muscle: No Joint: No Joint: No Joint: No Bone: No Bone: No Bone: No None None None Epithelialization: Chemical/Enzymatic/Mechanical Chemical/Enzymatic/Mechanical Chemical/Enzymatic/Mechanical Debridement: Pre-procedure Verification/Time Out 10:50 10:50 10:50 Taken: Other Other Other Pain Control: Other(Gauze) Other(Gauze) Other(Gauze) Instrument: Minimum Minimum Minimum Bleeding: Pressure Pressure Pressure Hemostasis A chieved: Procedure was tolerated well Procedure was tolerated well Procedure was tolerated well Debridement Treatment Response: 8x6x0.2 9x8.5x0.1  0.5x0.8x0.1 Post Debridement Measurements L x W x D (cm) 7.54 6.008 0.031 Post Debridement Volume: (cm) Debridement Debridement Debridement Procedures Performed: Treatment Notes Wound #1 (Ankle) Wound Laterality: Right, Medial Cleanser Soap and Water Discharge Instruction: May shower and wash wound with dial antibacterial soap and water prior to dressing change. Wound Cleanser Discharge Instruction: Cleanse the wound with wound cleanser prior to applying a clean dressing using gauze sponges, not tissue or cotton balls. Peri-Wound Care Topical Primary Dressing KerraCel Ag Gelling Fiber Dressing, 4x5 in (silver alginate) Discharge Instruction: Apply silver alginate to wound bed as instructed Secondary Dressing Woven Gauze Sponge, Non-Sterile 4x4 in Discharge Instruction: Apply over primary dressing as directed. ABD Pad, 8x10 Discharge Instruction: Apply over primary dressing as directed. Secured With Elastic Bandage 4 inch (ACE bandage) Discharge Instruction: Secure with ACE bandage as directed. Kerlix Roll Sterile, 4.5x3.1 (in/yd) Discharge Instruction: Secure with Kerlix as directed. Transpore Surgical Tape, 2x10 (in/yd) Discharge Instruction: Secure dressing with tape as directed. Compression Wrap Compression Stockings Add-Ons Wound #2 (Foot) Wound Laterality: Dorsal, Right Cleanser Soap and Water Discharge Instruction: May shower and wash wound with dial antibacterial soap and water prior to dressing change. Wound Cleanser Discharge Instruction: Cleanse the wound with wound cleanser prior  to applying a clean dressing using gauze sponges, not tissue or cotton balls. Peri-Wound Care Topical Primary Dressing KerraCel Ag Gelling Fiber Dressing, 4x5 in (silver alginate) Discharge Instruction: Apply silver alginate to wound bed as instructed Secondary Dressing Woven Gauze Sponge, Non-Sterile 4x4 in Discharge Instruction: Apply over primary dressing as directed. ABD Pad,  8x10 Discharge Instruction: Apply over primary dressing as directed. Secured With Elastic Bandage 4 inch (ACE bandage) Discharge Instruction: Secure with ACE bandage as directed. Kerlix Roll Sterile, 4.5x3.1 (in/yd) Discharge Instruction: Secure with Kerlix as directed. Transpore Surgical Tape, 2x10 (in/yd) Discharge Instruction: Secure dressing with tape as directed. Compression Wrap Compression Stockings Add-Ons Wound #3 (Ankle) Wound Laterality: Left, Medial Cleanser Soap and Water Discharge Instruction: May shower and wash wound with dial antibacterial soap and water prior to dressing change. Wound Cleanser Discharge Instruction: Cleanse the wound with wound cleanser prior to applying a clean dressing using gauze sponges, not tissue or cotton balls. Peri-Wound Care Topical Primary Dressing KerraCel Ag Gelling Fiber Dressing, 4x5 in (silver alginate) Discharge Instruction: Apply silver alginate to wound bed as instructed Secondary Dressing Woven Gauze Sponge, Non-Sterile 4x4 in Discharge Instruction: Apply over primary dressing as directed. ABD Pad, 8x10 Discharge Instruction: Apply over primary dressing as directed. Secured With Elastic Bandage 4 inch (ACE bandage) Discharge Instruction: Secure with ACE bandage as directed. Kerlix Roll Sterile, 4.5x3.1 (in/yd) Discharge Instruction: Secure with Kerlix as directed. Transpore Surgical Tape, 2x10 (in/yd) Discharge Instruction: Secure dressing with tape as directed. Compression Wrap Compression Stockings Add-Ons Wound #4 (Ankle) Wound Laterality: Left, Lateral Cleanser Soap and Water Discharge Instruction: May shower and wash wound with dial antibacterial soap and water prior to dressing change. Wound Cleanser Discharge Instruction: Cleanse the wound with wound cleanser prior to applying a clean dressing using gauze sponges, not tissue or cotton balls. Peri-Wound Care Topical Primary Dressing KerraCel Ag Gelling Fiber  Dressing, 4x5 in (silver alginate) Discharge Instruction: Apply silver alginate to wound bed as instructed Secondary Dressing Woven Gauze Sponge, Non-Sterile 4x4 in Discharge Instruction: Apply over primary dressing as directed. ABD Pad, 8x10 Discharge Instruction: Apply over primary dressing as directed. Secured With Elastic Bandage 4 inch (ACE bandage) Discharge Instruction: Secure with ACE bandage as directed. Kerlix Roll Sterile, 4.5x3.1 (in/yd) Discharge Instruction: Secure with Kerlix as directed. Transpore Surgical Tape, 2x10 (in/yd) Discharge Instruction: Secure dressing with tape as directed. Compression Wrap Compression Stockings Add-Ons Wound #5 (Foot) Wound Laterality: Dorsal, Left Cleanser Soap and Water Discharge Instruction: May shower and wash wound with dial antibacterial soap and water prior to dressing change. Wound Cleanser Discharge Instruction: Cleanse the wound with wound cleanser prior to applying a clean dressing using gauze sponges, not tissue or cotton balls. Peri-Wound Care Topical Primary Dressing KerraCel Ag Gelling Fiber Dressing, 4x5 in (silver alginate) Discharge Instruction: Apply silver alginate to wound bed as instructed Secondary Dressing Woven Gauze Sponge, Non-Sterile 4x4 in Discharge Instruction: Apply over primary dressing as directed. ABD Pad, 8x10 Discharge Instruction: Apply over primary dressing as directed. Secured With Elastic Bandage 4 inch (ACE bandage) Discharge Instruction: Secure with ACE bandage as directed. Kerlix Roll Sterile, 4.5x3.1 (in/yd) Discharge Instruction: Secure with Kerlix as directed. Transpore Surgical Tape, 2x10 (in/yd) Discharge Instruction: Secure dressing with tape as directed. Compression Wrap Compression Stockings Add-Ons Wound #6 (Foot) Wound Laterality: Left, Medial Cleanser Soap and Water Discharge Instruction: May shower and wash wound with dial antibacterial soap and water prior to dressing  change. Wound Cleanser Discharge Instruction: Cleanse the wound with wound cleanser prior to  applying a clean dressing using gauze sponges, not tissue or cotton balls. Peri-Wound Care Topical Primary Dressing KerraCel Ag Gelling Fiber Dressing, 4x5 in (silver alginate) Discharge Instruction: Apply silver alginate to wound bed as instructed Secondary Dressing Woven Gauze Sponge, Non-Sterile 4x4 in Discharge Instruction: Apply over primary dressing as directed. ABD Pad, 8x10 Discharge Instruction: Apply over primary dressing as directed. Secured With Elastic Bandage 4 inch (ACE bandage) Discharge Instruction: Secure with ACE bandage as directed. Kerlix Roll Sterile, 4.5x3.1 (in/yd) Discharge Instruction: Secure with Kerlix as directed. Transpore Surgical Tape, 2x10 (in/yd) Discharge Instruction: Secure dressing with tape as directed. Compression Wrap Compression Stockings Add-Ons Electronic Signature(s) Signed: 02/22/2021 11:50:52 AM By: Kalman Shan DO Signed: 02/22/2021 5:26:48 PM By: Candace Robinson Previous Signature: 02/22/2021 11:37:10 AM Version By: Candace Robinson Previous Signature: 02/22/2021 11:30:34 AM Version By: Fara Chute By: Kalman Shan on 02/22/2021 11:38:35 -------------------------------------------------------------------------------- Multi-Disciplinary Care Plan Details Patient Name: Date of Service: Candace A DNA X, [name] 02/22/2021 9:00 A M Medical Record Number: [id number] Patient Account Number: [id number] Date of Birth/Sex: Treating RN: 1966/04/21 (55 y.o. Candace Robinson Primary Care Arisbel Maione: PA Candace Robinson, [place] Other Clinician: Referring Ammon Muscatello: Treating Reyonna Haack/Extender: Candace Robinson in Treatment: 0 Active Inactive Tissue Oxygenation Nursing Diagnoses: Actual ineffective tissue perfusion; peripheral (select once diagnosis is confirmed) Goals: Patient/caregiver will verbalize understanding of disease  process and disease management Date Initiated: 02/22/2021 Target Resolution Date: 03/29/2021 Goal Status: Active Interventions: Assess patient understanding of disease process and management upon diagnosis and as needed Assess peripheral arterial status upon admission and as needed Provide education on tissue oxygenation and ischemia Treatment Activities: T ordered outside of clinic : 02/22/2021 est Notes: Wound/Skin Impairment Nursing Diagnoses: Impaired tissue integrity Goals: Patient/caregiver will verbalize understanding of skin care regimen Date Initiated: 02/22/2021 Target Resolution Date: 03/22/2021 Goal Status: Active Ulcer/skin breakdown will have a volume reduction of 30% by week 4 Date Initiated: 02/22/2021 Target Resolution Date: 03/22/2021 Goal Status: Active Interventions: Assess patient/caregiver ability to obtain necessary supplies Assess patient/caregiver ability to perform ulcer/skin care regimen upon admission and as needed Assess ulceration(s) every visit Provide education on ulcer and skin care Treatment Activities: Topical wound management initiated : 02/22/2021 Notes: Electronic Signature(s) Signed: 02/22/2021 5:26:48 PM By: Candace Robinson Entered By: Candace Robinson on 02/22/2021 10:27:40 -------------------------------------------------------------------------------- Pain Assessment Details Patient Name: Date of Service: Candace A DNA X, [name] 02/22/2021 9:00 A M Medical Record Number: [id number] Patient Account Number: [id number] Date of Birth/Sex: Treating RN: March 30, 1966 (55 y.o. Candace Robinson Primary Care Mccauley Diehl: PA Candace Robinson, [place] Other Clinician: Referring Lanah Steines: Treating Mai Longnecker/Extender: Candace Robinson in Treatment: 0 Active Problems Location of Pain Severity and Description of Pain Patient Has Paino Yes Site Locations Pain Location: Pain Location: Pain in Ulcers With Dressing Change: Yes Duration of the  Pain. Constant / Intermittento Constant Rate the pain. Current Pain Level: 8 Worst Pain Level: 10 Character of Pain Describe the Pain: Burning Pain Management and Medication Current Pain Management: Medication: Yes Cold Application: No Rest: No Massage: No Activity: No T.E.N.S.: No Heat Application: No Leg drop or elevation: No Is the Current Pain Management Adequate: Adequate How does your wound impact your activities of daily livingo Sleep: No Bathing: No Appetite: No Relationship With Others: No Bladder Continence: No Emotions: No Bowel Continence: No Work: No Toileting: No Drive: No Dressing: No Hobbies: No Engineer, maintenance) Signed: 02/22/2021 5:12:26 PM By: Candace Hurst RN, BSN Entered By: Candace Robinson on 02/22/2021  10:03:19 -------------------------------------------------------------------------------- Patient/Caregiver Education Details Patient Name: Date of Service: Candace A DNA X, [name] 8/22/2022andnbsp9:00 A M Medical Record Number: [id number] Patient Account Number: [id number] Date of Birth/Gender: Treating RN: December 17, 1965 (55 y.o. Candace Robinson Primary Care Physician: PA Candace Robinson, [place] Other Clinician: Referring Physician: Treating Physician/Extender: Candace Robinson in Treatment: 0 Education Assessment Education Provided To: Patient Education Topics Provided Tissue Oxygenation: Methods: Explain/Verbal, Printed Responses: State content correctly Wound/Skin Impairment: Methods: Explain/Verbal, Printed Responses: State content correctly Electronic Signature(s) Signed: 02/22/2021 5:26:48 PM By: Candace Robinson Entered By: Candace Robinson on 02/22/2021 10:28:01 -------------------------------------------------------------------------------- Wound Assessment Details Patient Name: Date of Service: Candace A DNA X, [name] 02/22/2021 9:00 A M Medical Record Number: [id number] Patient Account Number: [id number] Date of  Birth/Sex: Treating RN: 09/14/1965 (55 y.o. Candace Robinson Primary Care Wake Conlee: PA TIENT, NO Other Clinician: Referring Dominick Zertuche: Treating Avri Paiva/Extender: Candace Robinson in Treatment: 0 Wound Status Wound Number: 1 Primary Diabetic Wound/Ulcer of the Lower Extremity Etiology: Wound Location: Right, Medial Ankle Wound Open Wounding Event: Gradually Appeared Status: Date Acquired: 04/03/2020 Comorbid Anemia, Arrhythmia, Congestive Heart Failure, Hypertension, Weeks Of Treatment: 0 History: Type II Diabetes, Lupus Erythematosus Clustered Wound: No Photos Wound Measurements Length: (cm) 5 Width: (cm) 4.2 Depth: (cm) 0.2 Area: (cm) 16.493 Volume: (cm) 3.299 % Reduction in Area: 0% % Reduction in Volume: 0% Epithelialization: None Tunneling: No Undermining: No Wound Description Classification: Grade 2 Wound Margin: Flat and Intact Exudate Amount: Medium Exudate Type: Serosanguineous Exudate Color: red, brown Foul Odor After Cleansing: No Slough/Fibrino Yes Wound Bed Granulation Amount: Small (1-33%) Exposed Structure Granulation Quality: Red Fascia Exposed: No Necrotic Amount: Large (67-100%) Fat Layer (Subcutaneous Tissue) Exposed: Yes Necrotic Quality: Eschar, Adherent Slough Tendon Exposed: No Muscle Exposed: No Joint Exposed: No Bone Exposed: No Treatment Notes Wound #1 (Ankle) Wound Laterality: Right, Medial Cleanser Soap and Water Discharge Instruction: May shower and wash wound with dial antibacterial soap and water prior to dressing change. Wound Cleanser Discharge Instruction: Cleanse the wound with wound cleanser prior to applying a clean dressing using gauze sponges, not tissue or cotton balls. Peri-Wound Care Topical Primary Dressing KerraCel Ag Gelling Fiber Dressing, 4x5 in (silver alginate) Discharge Instruction: Apply silver alginate to wound bed as instructed Secondary Dressing Woven Gauze Sponge,  Non-Sterile 4x4 in Discharge Instruction: Apply over primary dressing as directed. ABD Pad, 8x10 Discharge Instruction: Apply over primary dressing as directed. Secured With Elastic Bandage 4 inch (ACE bandage) Discharge Instruction: Secure with ACE bandage as directed. Kerlix Roll Sterile, 4.5x3.1 (in/yd) Discharge Instruction: Secure with Kerlix as directed. Transpore Surgical Tape, 2x10 (in/yd) Discharge Instruction: Secure dressing with tape as directed. Compression Wrap Compression Stockings Add-Ons Electronic Signature(s) Signed: 02/22/2021 5:12:26 PM By: Candace Hurst RN, BSN Signed: 02/24/2021 9:12:52 AM By: Sandre Kitty Entered By: Sandre Kitty on 02/22/2021 09:59:08 -------------------------------------------------------------------------------- Wound Assessment Details Patient Name: Date of Service: Candace A DNA X, [name] 02/22/2021 9:00 A M Medical Record Number: [id number] Patient Account Number: [id number] Date of Birth/Sex: Treating RN: Nov 29, 1965 (55 y.o. Candace Robinson Primary Care Barbara Ahart: PA Candace Robinson, [place] Other Clinician: Referring Ellon Marasco: Treating Sheritta Deeg/Extender: Candace Robinson in Treatment: 0 Wound Status Wound Number: 2 Primary Diabetic Wound/Ulcer of the Lower Extremity Etiology: Wound Location: Right, Dorsal Foot Wound Open Wounding Event: Gradually Appeared Status: Date Acquired: 04/03/2020 Comorbid Anemia, Arrhythmia, Congestive Heart Failure, Hypertension, Weeks Of Treatment: 0 History: Type II Diabetes, Lupus Erythematosus Clustered Wound: No Photos  Wound Measurements Length: (cm) 3 Width: (cm) 5 Depth: (cm) 0.1 Area: (cm) 11.781 Volume: (cm) 1.178 % Reduction in Area: 0% % Reduction in Volume: 0% Epithelialization: None Tunneling: No Undermining: No Wound Description Classification: Grade 2 Wound Margin: Flat and Intact Exudate Amount: Medium Exudate Type: Serosanguineous Exudate Color:  red, brown Foul Odor After Cleansing: No Slough/Fibrino Yes Wound Bed Granulation Amount: Medium (34-66%) Exposed Structure Granulation Quality: Red Fascia Exposed: No Necrotic Amount: Medium (34-66%) Fat Layer (Subcutaneous Tissue) Exposed: Yes Necrotic Quality: Adherent Slough Tendon Exposed: No Muscle Exposed: No Joint Exposed: No Bone Exposed: No Treatment Notes Wound #2 (Foot) Wound Laterality: Dorsal, Right Cleanser Soap and Water Discharge Instruction: May shower and wash wound with dial antibacterial soap and water prior to dressing change. Wound Cleanser Discharge Instruction: Cleanse the wound with wound cleanser prior to applying a clean dressing using gauze sponges, not tissue or cotton balls. Peri-Wound Care Topical Primary Dressing KerraCel Ag Gelling Fiber Dressing, 4x5 in (silver alginate) Discharge Instruction: Apply silver alginate to wound bed as instructed Secondary Dressing Woven Gauze Sponge, Non-Sterile 4x4 in Discharge Instruction: Apply over primary dressing as directed. ABD Pad, 8x10 Discharge Instruction: Apply over primary dressing as directed. Secured With Elastic Bandage 4 inch (ACE bandage) Discharge Instruction: Secure with ACE bandage as directed. Kerlix Roll Sterile, 4.5x3.1 (in/yd) Discharge Instruction: Secure with Kerlix as directed. Transpore Surgical Tape, 2x10 (in/yd) Discharge Instruction: Secure dressing with tape as directed. Compression Wrap Compression Stockings Add-Ons Electronic Signature(s) Signed: 02/22/2021 5:12:26 PM By: Candace Hurst RN, BSN Signed: 02/24/2021 9:12:52 AM By: Sandre Kitty Entered By: Sandre Kitty on 02/22/2021 09:59:36 -------------------------------------------------------------------------------- Wound Assessment Details Patient Name: Date of Service: Candace A DNA X, [name] 02/22/2021 9:00 A M Medical Record Number: [id number] Patient Account Number: [id number] Date of Birth/Sex: Treating  RN: 22-Jan-1966 (55 y.o. Candace Robinson Primary Care Vanya Carberry: PA TIENT, NO Other Clinician: Referring Zola Runion: Treating Kahmari Koller/Extender: Candace Robinson in Treatment: 0 Wound Status Wound Number: 3 Primary Diabetic Wound/Ulcer of the Lower Extremity Etiology: Wound Location: Left, Medial Ankle Wound Open Wounding Event: Gradually Appeared Status: Date Acquired: 02/22/2021 Comorbid Anemia, Arrhythmia, Congestive Heart Failure, Hypertension, Weeks Of Treatment: 0 History: Type II Diabetes, Lupus Erythematosus Clustered Wound: No Photos Wound Measurements Length: (cm) 1 Width: (cm) 2.5 Depth: (cm) 0.2 Area: (cm) 1.963 Volume: (cm) 0.393 % Reduction in Area: 0% % Reduction in Volume: 0% Epithelialization: None Tunneling: No Undermining: No Wound Description Classification: Grade 2 Wound Margin: Flat and Intact Exudate Amount: Medium Exudate Type: Serosanguineous Exudate Color: red, brown Foul Odor After Cleansing: No Slough/Fibrino Yes Wound Bed Granulation Amount: Large (67-100%) Exposed Structure Granulation Quality: Red Fascia Exposed: No Necrotic Amount: Small (1-33%) Fat Layer (Subcutaneous Tissue) Exposed: Yes Necrotic Quality: Adherent Slough Tendon Exposed: No Muscle Exposed: No Joint Exposed: No Bone Exposed: No Treatment Notes Wound #3 (Ankle) Wound Laterality: Left, Medial Cleanser Soap and Water Discharge Instruction: May shower and wash wound with dial antibacterial soap and water prior to dressing change. Wound Cleanser Discharge Instruction: Cleanse the wound with wound cleanser prior to applying a clean dressing using gauze sponges, not tissue or cotton balls. Peri-Wound Care Topical Primary Dressing KerraCel Ag Gelling Fiber Dressing, 4x5 in (silver alginate) Discharge Instruction: Apply silver alginate to wound bed as instructed Secondary Dressing Woven Gauze Sponge, Non-Sterile 4x4 in Discharge Instruction:  Apply over primary dressing as directed. ABD Pad, 8x10 Discharge Instruction: Apply over primary dressing as directed. Secured With Elastic Bandage 4 inch (ACE  bandage) Discharge Instruction: Secure with ACE bandage as directed. Kerlix Roll Sterile, 4.5x3.1 (in/yd) Discharge Instruction: Secure with Kerlix as directed. Transpore Surgical Tape, 2x10 (in/yd) Discharge Instruction: Secure dressing with tape as directed. Compression Wrap Compression Stockings Add-Ons Electronic Signature(s) Signed: 02/22/2021 5:12:26 PM By: Candace Hurst RN, BSN Signed: 02/24/2021 9:12:52 AM By: Sandre Kitty Entered By: Sandre Kitty on 02/22/2021 10:00:15 -------------------------------------------------------------------------------- Wound Assessment Details Patient Name: Date of Service: Candace A DNA X, [name] 02/22/2021 9:00 A M Medical Record Number: [id number] Patient Account Number: [id number] Date of Birth/Sex: Treating RN: 1966-02-03 (55 y.o. Candace Robinson Primary Care Eythan Jayne: PA TIENT, NO Other Clinician: Referring Jenyfer Trawick: Treating Kaushal Vannice/Extender: Candace Robinson in Treatment: 0 Wound Status Wound Number: 4 Primary Diabetic Wound/Ulcer of the Lower Extremity Etiology: Wound Location: Left, Lateral Ankle Wound Open Wounding Event: Gradually Appeared Status: Date Acquired: 04/03/2020 Comorbid Anemia, Arrhythmia, Congestive Heart Failure, Hypertension, Weeks Of Treatment: 0 History: Type II Diabetes, Lupus Erythematosus Clustered Wound: No Photos Wound Measurements Length: (cm) 8 Width: (cm) 6 Depth: (cm) 0.2 Area: (cm) 37.699 Volume: (cm) 7.54 Wound Description Classification: Grade 2 Wound Margin: Flat and Intact Exudate Amount: Medium Exudate Type: Serosanguineous Exudate Color: red, brown Foul Odor After Cleansing: Slough/Fibrino % Reduction in Area: 0% % Reduction in Volume: 0% Epithelialization: None Tunneling:  No Undermining: No No Yes Wound Bed Granulation Amount: Small (1-33%) Exposed Structure Granulation Quality: Red Fascia Exposed: No Necrotic Amount: Large (67-100%) Fat Layer (Subcutaneous Tissue) Exposed: Yes Necrotic Quality: Adherent Slough Tendon Exposed: No Muscle Exposed: No Joint Exposed: No Bone Exposed: No Treatment Notes Wound #4 (Ankle) Wound Laterality: Left, Lateral Cleanser Soap and Water Discharge Instruction: May shower and wash wound with dial antibacterial soap and water prior to dressing change. Wound Cleanser Discharge Instruction: Cleanse the wound with wound cleanser prior to applying a clean dressing using gauze sponges, not tissue or cotton balls. Peri-Wound Care Topical Primary Dressing KerraCel Ag Gelling Fiber Dressing, 4x5 in (silver alginate) Discharge Instruction: Apply silver alginate to wound bed as instructed Secondary Dressing Woven Gauze Sponge, Non-Sterile 4x4 in Discharge Instruction: Apply over primary dressing as directed. ABD Pad, 8x10 Discharge Instruction: Apply over primary dressing as directed. Secured With Elastic Bandage 4 inch (ACE bandage) Discharge Instruction: Secure with ACE bandage as directed. Kerlix Roll Sterile, 4.5x3.1 (in/yd) Discharge Instruction: Secure with Kerlix as directed. Transpore Surgical Tape, 2x10 (in/yd) Discharge Instruction: Secure dressing with tape as directed. Compression Wrap Compression Stockings Add-Ons Electronic Signature(s) Signed: 02/22/2021 5:12:26 PM By: Candace Hurst RN, BSN Signed: 02/24/2021 9:12:52 AM By: Sandre Kitty Entered By: Sandre Kitty on 02/22/2021 10:00:55 -------------------------------------------------------------------------------- Wound Assessment Details Patient Name: Date of Service: Candace A DNA X, [name] 02/22/2021 9:00 A M Medical Record Number: [id number] Patient Account Number: [id number] Date of Birth/Sex: Treating RN: 1966/01/27 (55 y.o. Candace Robinson Primary Care Owynn Mosqueda: PA TIENT, NO Other Clinician: Referring Nohely Whitehorn: Treating Janvi Ammar/Extender: Candace Robinson in Treatment: 0 Wound Status Wound Number: 5 Primary Diabetic Wound/Ulcer of the Lower Extremity Etiology: Wound Location: Left, Dorsal Foot Wound Open Wounding Event: Gradually Appeared Status: Date Acquired: 04/03/2020 Comorbid Anemia, Arrhythmia, Congestive Heart Failure, Hypertension, Weeks Of Treatment: 0 History: Type II Diabetes, Lupus Erythematosus Clustered Wound: No Photos Wound Measurements Length: (cm) 9 Width: (cm) 8.5 Depth: (cm) 0.1 Area: (cm) 60.083 Volume: (cm) 6.008 % Reduction in Area: 0% % Reduction in Volume: 0% Epithelialization: None Tunneling: No Undermining: No Wound Description Classification: Unable to visualize wound  bed Wound Margin: Distinct, outline attached Exudate Amount: Medium Exudate Type: Purulent Exudate Color: yellow, brown, green Foul Odor After Cleansing: No Slough/Fibrino Yes Wound Bed Granulation Amount: None Present (0%) Exposed Structure Necrotic Amount: Large (67-100%) Fascia Exposed: No Necrotic Quality: Eschar, Adherent Slough Fat Layer (Subcutaneous Tissue) Exposed: No Tendon Exposed: No Muscle Exposed: No Joint Exposed: No Bone Exposed: No Treatment Notes Wound #5 (Foot) Wound Laterality: Dorsal, Left Cleanser Soap and Water Discharge Instruction: May shower and wash wound with dial antibacterial soap and water prior to dressing change. Wound Cleanser Discharge Instruction: Cleanse the wound with wound cleanser prior to applying a clean dressing using gauze sponges, not tissue or cotton balls. Peri-Wound Care Topical Primary Dressing KerraCel Ag Gelling Fiber Dressing, 4x5 in (silver alginate) Discharge Instruction: Apply silver alginate to wound bed as instructed Secondary Dressing Woven Gauze Sponge, Non-Sterile 4x4 in Discharge Instruction: Apply over  primary dressing as directed. ABD Pad, 8x10 Discharge Instruction: Apply over primary dressing as directed. Secured With Elastic Bandage 4 inch (ACE bandage) Discharge Instruction: Secure with ACE bandage as directed. Kerlix Roll Sterile, 4.5x3.1 (in/yd) Discharge Instruction: Secure with Kerlix as directed. Transpore Surgical Tape, 2x10 (in/yd) Discharge Instruction: Secure dressing with tape as directed. Compression Wrap Compression Stockings Add-Ons Electronic Signature(s) Signed: 02/22/2021 5:12:26 PM By: Candace Hurst RN, BSN Signed: 02/24/2021 9:12:52 AM By: Sandre Kitty Entered By: Sandre Kitty on 02/22/2021 10:01:31 -------------------------------------------------------------------------------- Wound Assessment Details Patient Name: Date of Service: Candace A DNA X, [name] 02/22/2021 9:00 A M Medical Record Number: [id number] Patient Account Number: [id number] Date of Birth/Sex: Treating RN: [birth year] (55 y.o. Candace Robinson Primary Care Karilyn Wind: PA TIENT, NO Other Clinician: Referring Lafaye Mcelmurry: Treating Xavier Munger/Extender: Candace Robinson in Treatment: 0 Wound Status Wound Number: 6 Primary Diabetic Wound/Ulcer of the Lower Extremity Etiology: Wound Location: Left, Medial Foot Wound Open Wounding Event: Gradually Appeared Status: Date Acquired: 04/03/2020 Comorbid Anemia, Arrhythmia, Congestive Heart Failure, Hypertension, Weeks Of Treatment: 0 History: Type II Diabetes, Lupus Erythematosus Clustered Wound: No Photos Wound Measurements Length: (cm) 0.5 Width: (cm) 0.8 Depth: (cm) 0.1 Area: (cm) 0.314 Volume: (cm) 0.031 % Reduction in Area: 0% % Reduction in Volume: 0% Epithelialization: None Tunneling: No Undermining: No Wound Description Classification: Grade 2 Wound Margin: Flat and Intact Exudate Amount: Medium Exudate Type: Serosanguineous Exudate Color: red, brown Foul Odor After Cleansing:  No Slough/Fibrino No Wound Bed Granulation Amount: Large (67-100%) Exposed Structure Granulation Quality: Red, Pink Fascia Exposed: No Necrotic Amount: None Present (0%) Fat Layer (Subcutaneous Tissue) Exposed: Yes Tendon Exposed: No Muscle Exposed: No Joint Exposed: No Bone Exposed: No Treatment Notes Wound #6 (Foot) Wound Laterality: Left, Medial Cleanser Soap and Water Discharge Instruction: May shower and wash wound with dial antibacterial soap and water prior to dressing change. Wound Cleanser Discharge Instruction: Cleanse the wound with wound cleanser prior to applying a clean dressing using gauze sponges, not tissue or cotton balls. Peri-Wound Care Topical Primary Dressing KerraCel Ag Gelling Fiber Dressing, 4x5 in (silver alginate) Discharge Instruction: Apply silver alginate to wound bed as instructed Secondary Dressing Woven Gauze Sponge, Non-Sterile 4x4 in Discharge Instruction: Apply over primary dressing as directed. ABD Pad, 8x10 Discharge Instruction: Apply over primary dressing as directed. Secured With Elastic Bandage 4 inch (ACE bandage) Discharge Instruction: Secure with ACE bandage as directed. Kerlix Roll Sterile, 4.5x3.1 (in/yd) Discharge Instruction: Secure with Kerlix as directed. Transpore Surgical Tape, 2x10 (in/yd) Discharge Instruction: Secure dressing with tape as directed. Compression Wrap Compression Stockings Add-Ons Electronic  Signature(s) Signed: 02/22/2021 5:12:26 PM By: Candace Hurst RN, BSN Signed: 02/24/2021 9:12:52 AM By: Sandre Kitty Entered By: Sandre Kitty on 02/22/2021 10:02:42 -------------------------------------------------------------------------------- Vitals Details Patient Name: Date of Service: Candace A Candace Passe, [name] 02/22/2021 9:00 A M Medical Record Number: [id number] Patient Account Number: [id number] Date of Birth/Sex: Treating RN: 05/17/1966 (55 y.o. Candace Robinson Primary Care Dallin Mccorkel: PA Candace Robinson,  [place] Other Clinician: Referring Dhalia Zingaro: Treating Jasson Siegmann/Extender: Candace Robinson in Treatment: 0 Vital Signs Time Taken: 09:03 Temperature (F): 98.7 Height (in): 63 Pulse (bpm): 81 Source: Stated Respiratory Rate (breaths/min): 18 Weight (lbs): 157 Blood Pressure (mmHg): 130/77 Source: Stated Reference Range: 80 - 120 mg / dl Body Mass Index (BMI): 27.8 Electronic Signature(s) Signed: 02/24/2021 9:12:52 AM By: Sandre Kitty Entered By: Sandre Kitty on 02/22/2021 09:04:19

## 2021-02-25 ENCOUNTER — Ambulatory Visit (HOSPITAL_COMMUNITY)
Admission: RE | Admit: 2021-02-25 | Discharge: 2021-02-25 | Disposition: A | Discharge status: Home / Self Care | Payer: Medicaid Other | Source: Ambulatory Visit | Attending: Internal Medicine | Admitting: Internal Medicine

## 2021-02-25 ENCOUNTER — Other Ambulatory Visit: Payer: Self-pay

## 2021-02-25 ENCOUNTER — Other Ambulatory Visit (HOSPITAL_COMMUNITY): Payer: Self-pay | Admitting: Internal Medicine

## 2021-02-25 DIAGNOSIS — I739 Peripheral vascular disease, unspecified: Secondary | ICD-10-CM | POA: Diagnosis present

## 2021-03-01 ENCOUNTER — Encounter (HOSPITAL_BASED_OUTPATIENT_CLINIC_OR_DEPARTMENT_OTHER): Payer: Medicaid Other | Admitting: Internal Medicine

## 2021-03-01 ENCOUNTER — Other Ambulatory Visit: Payer: Self-pay

## 2021-03-01 DIAGNOSIS — I739 Peripheral vascular disease, unspecified: Secondary | ICD-10-CM | POA: Diagnosis not present

## 2021-03-01 DIAGNOSIS — E119 Type 2 diabetes mellitus without complications: Secondary | ICD-10-CM

## 2021-03-01 DIAGNOSIS — L97529 Non-pressure chronic ulcer of other part of left foot with unspecified severity: Secondary | ICD-10-CM

## 2021-03-01 DIAGNOSIS — L97519 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: Secondary | ICD-10-CM

## 2021-03-01 DIAGNOSIS — E11621 Type 2 diabetes mellitus with foot ulcer: Secondary | ICD-10-CM | POA: Diagnosis not present

## 2021-03-01 NOTE — Progress Notes (Signed)
JOHNATHON, MOREJON ([id number]) Visit Report for 03/01/2021 Chief Complaint Document Details Patient Name: Date of Service: [name], [name] 03/01/2021 9:00 A M Medical Record Number: [id number] Patient Account Number: [id number] Date of Birth/Sex: Treating RN: [birth year] (55 y.o. [name] Primary Care Provider: Saul Fordyce NCE, RO Oneita Hurt Other Clinician: Referring Provider: Treating Provider/Extender: Darcey Nora in Treatment: 1 Information Obtained from: Patient Chief Complaint Bilateral feet wounds Electronic Signature(s) Signed: 03/01/2021 11:24:15 AM By: Kalman Shan DO Entered By: Kalman Shan on 03/01/2021 11:17:06 -------------------------------------------------------------------------------- Debridement Details Patient Name: Date of Service: [name], [name] 03/01/2021 9:00 A M Medical Record Number: [id number] Patient Account Number: [id number] Date of Birth/Sex: Treating RN: 1965/11/18 (55 y.o. [name] Primary Care Provider: Saul Fordyce NCE, Georgette Shell Other Clinician: Referring Provider: Treating Provider/Extender: Darcey Nora in Treatment: 1 Debridement Performed for Assessment: Wound #5 Left,Dorsal Foot Performed By: Physician Kalman Shan, DO Debridement Type: Chemical/Enzymatic/Mechanical Agent Used: Santyl Severity of Tissue Pre Debridement: Fat layer exposed Level of Consciousness (Pre-procedure): Awake and Alert Pre-procedure Verification/Time Out Yes - 10:24 Taken: Start Time: 10:25 Bleeding: None End Time: 10:27 Response to Treatment: Procedure was tolerated well Level of Consciousness (Post- Awake and Alert procedure): Post Debridement Measurements of Total Wound Length: (cm) 8.5 Width: (cm) 6.5 Depth: (cm) 0.1 Volume: (cm) 4.339 Character of Wound/Ulcer Post Debridement: Stable Severity of Tissue Post Debridement: Fat layer exposed Post Procedure  Diagnosis Same as Pre-procedure Electronic Signature(s) Signed: 03/01/2021 11:24:15 AM By: Kalman Shan DO Signed: 03/01/2021 5:25:35 PM By: Lorrin Jackson Entered By: Lorrin Jackson on 03/01/2021 10:27:30 -------------------------------------------------------------------------------- HPI Details Patient Name: Date of Service: [name], [name] 03/01/2021 9:00 A M Medical Record Number: [id number] Patient Account Number: [id number] Date of Birth/Sex: Treating RN: June 28, 1966 (55 y.o. [name] Primary Care Provider: Saul Fordyce NCE, Georgette Shell Other Clinician: Referring Provider: Treating Provider/Extender: Darcey Nora in Treatment: 1 History of Present Illness HPI Description: Admission 8/22 Ms. Zipporah Odem is a 55 year old female with a past medical history of diet-controlled type 2 diabetes, hypothyroidism, chronic diastolic heart failure, cutaneous lupus erythematosus and essential hypertension that presents to the clinic for a 41-monthhistory of worsening bilateral feet ulcers. She states that wounds to her feet have been an ongoing issue for several years. She states that with wound care they improve however they tend to wax and wane. 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 She was discharged to a nursing facility and she was provided wound care for her bilateral feet ulcers at that time. She states they used Silvadene and silver alginate. She states that when she was discharged in April from the nursing facility her wounds had improved but were not fully closed. She currently uses Silvadene but has not been using silver alginate for several months due to running out. She reports significant decline in her wound healing over the last 4 months. She reports pain to the wound sites. She currently denies increased warmth erythema or purulent drainage. 8/29; patient presents for 1 week follow-up. She states she  had ABIs completed without TBI's. She continues to use silver alginate with dressing changes. She reports continued pain. She denies signs of infection. Electronic Signature(s) Signed: 03/01/2021 11:24:15 AM By: HKalman ShanDO Entered By: HKalman Shanon 03/01/2021 11:17:46 -------------------------------------------------------------------------------- Physical Exam Details Patient Name: Date of Service: BRO A  DNA X, [name] 03/01/2021 9:00 A M Medical Record Number: [id number] Patient Account Number: [id number] Date of Birth/Sex: Treating RN: 1966-03-14 (55 y.o. [name] Primary Care Provider: Saul Fordyce NCE, Georgette Shell Other Clinician: Referring Provider: Treating Provider/Extender: Darcey Nora in Treatment: 1 Constitutional respirations regular, non-labored and within target range for patient.Marland Kitchen Psychiatric pleasant and cooperative. Notes Right lower extremity: T the medial ankle and dorsum of the right foot she has open wounds with necrotic debris and some granulation tissue present. Left o lower extremity: T the left medial ankle she has a small open wound with nonviable tissue and granulation tissue present. T the left lateral ankle she has a o o large open wound with granulation tissue and nonviable tissue present. T the left dorsum of the foot she has significant necrotic debris. T the medial left foot o o she has small open wounds with granulation tissue and nonviable tissue present. No obvious signs of infection to all wound sites. Difficult to palpate pedal pulses Electronic Signature(s) Signed: 03/01/2021 11:24:15 AM By: Kalman Shan DO Entered By: Kalman Shan on 03/01/2021 11:18:47 -------------------------------------------------------------------------------- Physician Orders Details Patient Name: Date of Service: [name], [name] 03/01/2021 9:00 A M Medical Record Number: [id number] Patient Account Number:  [id number] Date of Birth/Sex: Treating RN: 1965-12-12 (55 y.o. 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 - with Dr. Heber [place] Return A Other: - [facility]=Supplies Bathing/ Shower/ Hygiene May shower and wash wound with soap and water. - when dressings changed Edema Control - Lymphedema / SCD / Other Elevate legs to the level of the heart or above for 30 minutes daily and/or when sitting, a frequency of: Avoid standing for long periods of time. Additional Orders / Instructions Other: - [facility] to call and reschedule MD consult since they made mistake and did vascular testing. Wound Treatment Patient Medications llergies: Motrin A Notifications Medication Indication Start End 03/01/2021 Santyl DOSE topical 250 unit/gram ointment - 1 application daily x Q000111Q, ointment topical Electronic Signature(s) Signed: 03/01/2021 11:24:15 AM By: Kalman Shan DO Entered By: Kalman Shan on 03/01/2021 11:19:02 Prescription 03/01/2021 -------------------------------------------------------------------------------- Mikle Bosworth, Caitlyne L. Kalman Shan DO Patient Name: Provider: 1965/08/31 [id number] Date of Birth: NPI#: [id number] Sex: DEA #: [phone] Phone  #: License #: [place] Patient Address: [address], [place] [address] [place],  [address] [phone] Allergies Motrin Medication Medication: Route: Strength: Form: Santyl 250 unit/gram topical ointment topical 250 unit/gram ointment Class: TOPICAL/MUCOUS MEMBR./SUBCUT ENZYMES . Dose: Frequency / Time: Indication: 1 application daily x Q000111Q, ointment topical Number of Refills: Number of Units: 1 Thirty (30) Generic Substitution: Start Date: End Date: One Time Use: Substitution Permitted [id number] No Note to Pharmacy: Hand Signature: Date(s): Electronic Signature(s) Signed: 03/01/2021 11:24:15 AM By: Kalman Shan DO Entered By: Kalman Shan on 03/01/2021 11:19:03 --------------------------------------------------------------------------------  Problem List Details Patient Name: Date of Service: [name], [name] 03/01/2021 9:00 A M Medical Record Number: [id number] Patient Account Number: [id number] Date of Birth/Sex: Treating RN: [birth year] (55 y.o. 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 02/22/2021 No Yes L97.529 Non-pressure chronic ulcer of other part of left foot with unspecified severity 02/22/2021 No Yes I73.9 Peripheral vascular disease, unspecified 02/22/2021 No Yes E11.9 Type 2 diabetes mellitus without complications [id number] No Yes L93.2 Other local lupus erythematosus 02/22/2021 No Yes [id number] Chronic diastolic (congestive) heart failure 02/22/2021 No Yes I10 Essential (primary) hypertension 02/22/2021 No Yes E11.621 Type 2 diabetes mellitus with foot ulcer  02/22/2021 No Yes Inactive Problems Resolved Problems Electronic Signature(s) Signed: 03/01/2021 11:24:15 AM By: Kalman Shan DO Previous Signature: 03/01/2021 9:27:19 AM Version By: Lorrin Jackson Entered By: Kalman Shan on 03/01/2021 11:16:26 -------------------------------------------------------------------------------- Progress Note Details Patient Name: Date of Service: [name], [name] 03/01/2021 9:00 A M Medical Record Number: [id number] Patient Account Number: [id number] Date of Birth/Sex: Treating RN: 12/18/1965 (55 y.o. [name] Primary Care Provider: Saul Fordyce NCE, Georgette Shell Other Clinician: Referring Provider: Treating Provider/Extender: Darcey Nora in Treatment: 1 Subjective Chief Complaint Information obtained from Patient Bilateral feet wounds History of Present Illness (HPI) Admission 8/22 Ms. Vergil Gosch is a 55 year old female with a past medical history of diet-controlled type 2 diabetes, hypothyroidism, chronic diastolic heart failure, cutaneous lupus erythematosus and essential hypertension that presents to the clinic for a 10-monthhistory of worsening bilateral feet ulcers. She states that wounds to her feet have been an ongoing issue for several years. She states that with wound care they improve however they tend to wax and wane. 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 She was discharged to a nursing facility and she was provided wound care for her bilateral feet ulcers at that time. She states they used Silvadene and silver alginate. She states that when she was discharged in April from the nursing facility her wounds had improved but were not fully closed. She currently uses Silvadene but has not been using silver alginate for several months due to running out. She reports significant decline in her wound healing over the last 4 months. She reports pain to the wound sites.  She currently denies increased warmth erythema or purulent drainage. 8/29; patient presents for 1 week follow-up. She states she had ABIs completed without TBI's. She continues to use silver alginate with dressing changes. She reports continued pain. She denies signs of infection. Patient History Information obtained from Patient. Family History Unknown History. Social History Never smoker, Marital Status - Single, Alcohol Use - Never, Drug Use - No History, Caffeine Use - Rarely. Medical History Hematologic/Lymphatic Patient has history of Anemia Cardiovascular Patient has history of Arrhythmia - A-Fib, Congestive Heart Failure, Hypertension Denies history of Angina Endocrine Patient has history of Type II Diabetes Immunological Patient has history of Lupus Erythematosus Medical A Surgical History Notes nd Endocrine Grave's disease Objective Constitutional respirations regular, non-labored and within target range for patient.. Vitals Time Taken: 9:16 AM, Height: 63 in, Weight: 157 lbs, BMI: 27.8, Temperature: 98.7 F, Pulse: 67 bpm, Respiratory Rate: 18 breaths/min, Blood Pressure: 121/76 mmHg.  Psychiatric pleasant and cooperative. General Notes: Right lower extremity: T the medial ankle and dorsum of the right foot she has open wounds with necrotic debris and some granulation tissue o present. Left lower extremity: T the left medial ankle she has a small open wound with nonviable tissue and granulation tissue present. T the left lateral ankle o o she has a large open wound with granulation tissue and nonviable tissue present. T the left dorsum of the foot she has significant necrotic debris. T the o o medial left foot she has small open wounds with granulation tissue and nonviable tissue present. No obvious signs of infection to all wound sites. Difficult to palpate pedal pulses Integumentary (Hair, Skin) Wound #1 status is Open. Original cause of wound was Gradually  Appeared. The date acquired was: 04/03/2020. The wound has been in treatment 1 weeks. The wound is located on the Right,Medial Ankle. The wound measures 4.6cm length x 3.6cm width x 0.1cm depth; 13.006cm^2 area and 1.301cm^3 volume. There is Fat Layer (Subcutaneous Tissue) exposed. There is no tunneling or undermining noted. There is a medium amount of serosanguineous drainage noted. The wound margin is flat and intact. There is small (1-33%) red granulation within the wound bed. There is a large (67-100%) amount of necrotic tissue within the wound bed including Eschar and Adherent Slough. Wound #2 status is Open. Original cause of wound was Gradually Appeared. The date acquired was: 04/03/2020. The wound has been in treatment 1 weeks. The wound is located on the Right,Dorsal Foot. The wound measures 3cm length x 4.5cm width x 0.1cm depth; 10.603cm^2 area and 1.06cm^3 volume. There is Fat Layer (Subcutaneous Tissue) exposed. There is no tunneling or undermining noted. There is a medium amount of serosanguineous drainage noted. The wound margin is flat and intact. There is medium (34-66%) red granulation within the wound bed. There is a medium (34-66%) amount of necrotic tissue within the wound bed including Adherent Slough. Wound #3 status is Open. Original cause of wound was Gradually Appeared. The date acquired was: 02/22/2021. The wound has been in treatment 1 weeks. The wound is located on the Left,Medial Ankle. The wound measures 0.1cm length x 0.1cm width x 0.1cm depth; 0.008cm^2 area and 0.001cm^3 volume. There is Fat Layer (Subcutaneous Tissue) exposed. There is no tunneling or undermining noted. There is a small amount of serosanguineous drainage noted. The wound margin is flat and intact. There is no granulation within the wound bed. There is no necrotic tissue within the wound bed. Wound #4 status is Open. Original cause of wound was Gradually Appeared. The date acquired was: 04/03/2020. The  wound has been in treatment 1 weeks. The wound is located on the Left,Lateral Ankle. The wound measures 7cm length x 5.6cm width x 0.2cm depth; 30.788cm^2 area and 6.158cm^3 volume. There is Fat Layer (Subcutaneous Tissue) exposed. There is no tunneling or undermining noted. There is a medium amount of serosanguineous drainage noted. The wound margin is flat and intact. There is medium (34-66%) red granulation within the wound bed. There is a medium (34-66%) amount of necrotic tissue within the wound bed including Adherent Slough. Wound #5 status is Open. Original cause of wound was Gradually Appeared. The date acquired was: 04/03/2020. The wound has been in treatment 1 weeks. The wound is located on the Left,Dorsal Foot. The wound measures 8.5cm length x 6.5cm width x 0.1cm depth; 43.393cm^2 area and 4.339cm^3 volume. There is Fat Layer (Subcutaneous Tissue) exposed. There is no tunneling or undermining noted. There  is a medium amount of serosanguineous drainage noted. The wound margin is distinct with the outline attached to the wound base. There is small (1-33%) red granulation within the wound bed. There is a large (67-100%) amount of necrotic tissue within the wound bed including Eschar and Adherent Slough. Wound #6 status is Open. Original cause of wound was Gradually Appeared. The date acquired was: 04/03/2020. The wound has been in treatment 1 weeks. The wound is located on the Left,Medial Foot. The wound measures 0.3cm length x 0.7cm width x 0.1cm depth; 0.165cm^2 area and 0.016cm^3 volume. There is Fat Layer (Subcutaneous Tissue) exposed. There is no tunneling or undermining noted. There is a none present amount of drainage noted. The wound margin is flat and intact. There is no granulation within the wound bed. There is a large (67-100%) amount of necrotic tissue within the wound bed including Eschar. Assessment Active Problems ICD-10 Non-pressure chronic ulcer of other part of right foot  with unspecified severity Non-pressure chronic ulcer of other part of left foot with unspecified severity Peripheral vascular disease, unspecified Type 2 diabetes mellitus without complications Other local lupus erythematosus Chronic diastolic (congestive) heart failure Essential (primary) hypertension Type 2 diabetes mellitus with foot ulcer Procedures Wound #5 Pre-procedure diagnosis of Wound #5 is a Diabetic Wound/Ulcer of the Lower Extremity located on the Left,Dorsal Foot .Severity of Tissue Pre Debridement is: Fat layer exposed. There was a Chemical/Enzymatic/Mechanical debridement performed by Kalman Shan, DO.Marland Kitchen Agent used was [employer]. A time out was conducted at 10:24, prior to the start of the procedure. There was no bleeding. The procedure was tolerated well. Post Debridement Measurements: 8.5cm length x 6.5cm width x 0.1cm depth; 4.339cm^3 volume. Character of Wound/Ulcer Post Debridement is stable. Severity of Tissue Post Debridement is: Fat layer exposed. Post procedure Diagnosis Wound #5: Same as Pre-Procedure Patient's wounds are stable with some improvement in size to the smaller wounds. Unfortunately patient had ABIs without TBI's. I had ordered a consult to vein and vascular and I did not order these studies. We have reached out to vein and vascular to assure a consult. ABIs on this exam were similar to previous. With history and location of wounds I am still concerned about blood flow. At this time I recommended continuing silver alginate but adding Santyl to the necrotic debris to help remove this. She cannot tolerate in office sharp debridements. No signs of infection on exam. Plan Follow-up Appointments: Return Appointment in 1 week. - with Dr. Heber  Other: - [facility]=Supplies Bathing/ Shower/ Hygiene: May shower and wash wound with soap and water. - when dressings changed Edema Control - Lymphedema / SCD / Other: Elevate legs to the level of the heart or  above for 30 minutes daily and/or when sitting, a frequency of: Avoid standing for long periods of time. Additional Orders / Instructions: Other: - [facility] to call and reschedule MD consult since they made mistake and did vascular testing. The following medication(s) was prescribed: Santyl topical 250 unit/gram ointment 1 application daily x Q000111Q, ointment topical starting 03/01/2021 1. Continue silver alginate and add Santyl 2. Santyl prescription and coupon given to patient 3. Follow-up in 1 week 4. Vascular referral Electronic Signature(s) Signed: 03/01/2021 11:24:15 AM By: Kalman Shan DO Entered By: Kalman Shan on 03/01/2021 11:23:33 -------------------------------------------------------------------------------- HxROS Details Patient Name: Date of Service: [name], [name] 03/01/2021 9:00 A M Medical Record Number: [id number] Patient Account Number: [id number] Date of Birth/Sex: Treating RN: 1966-03-21 (56 y.o. [name]  Primary Care Provider: Saul Fordyce NCE, RO Oneita Hurt Other Clinician: Referring Provider: Treating Provider/Extender: Darcey Nora in Treatment: 1 Information Obtained From Patient Hematologic/Lymphatic Medical History: Positive for: Anemia Cardiovascular Medical History: Positive for: Arrhythmia - A-Fib; Congestive Heart Failure; Hypertension Negative for: Angina Endocrine Medical History: Positive for: Type II Diabetes Past Medical History Notes: Grave's disease Time with diabetes: over 10 years Treated with: Diet Blood sugar tested every day: No Immunological Medical History: Positive for: Lupus Erythematosus Immunizations Pneumococcal Vaccine: Received Pneumococcal Vaccination: No Implantable Devices None Family and Social History Unknown History: Yes; Never smoker; Marital Status - Single; Alcohol Use: Never; Drug Use: No History; Caffeine Use: Rarely; Financial Concerns: No; Food, Clothing or  Shelter Needs: No; Support System Lacking: No; Transportation Concerns: No Electronic Signature(s) Signed: 03/01/2021 11:24:15 AM By: Kalman Shan DO Signed: 03/01/2021 5:25:35 PM By: Lorrin Jackson Entered By: Kalman Shan on 03/01/2021 11:17:53 -------------------------------------------------------------------------------- SuperBill Details Patient Name: Date of Service: [name], [name] 03/01/2021 Medical Record Number: [id number] Patient Account Number: [id number] Date of Birth/Sex: Treating RN: 1965/11/19 (55 y.o. 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 L97.529 Non-pressure chronic ulcer of other part of left foot with unspecified severity I73.9 Peripheral vascular disease, unspecified E11.9 Type 2 diabetes mellitus without complications [id number] Other local lupus erythematosus [id number] Chronic diastolic (congestive) heart failure I10 Essential (primary) hypertension E11.621 Type 2 diabetes mellitus with foot ulcer Facility Procedures CPT4 Code: [id number] Description: [phone] - DEBRIDE W/O ANES NON SELECT ICD-10 Diagnosis Description L97.529 Non-pressure chronic ulcer of other part of left foot with unspecified severity Modifier: Quantity: 1 Physician Procedures : CPT4 Code Description Modifier [id number] - WC PHYS LEVEL 3 - EST PT ICD-10 Diagnosis Description I73.9 Peripheral vascular disease, unspecified L97.519 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

## 2021-03-01 NOTE — Progress Notes (Signed)
Candace, Robinson ([id number]) Visit Report for 03/01/2021 Arrival Information Details Patient Name: Date of Service: Candace Robinson, [place] RO Candace Candace. 03/01/2021 9:00 A M Medical Record Number: [id number] Patient Account Number: [id number] Date of Birth/Sex: Treating RN: 1966/05/07 (55 y.o. Candace Robinson Primary Care Ashari Llewellyn: Saul Fordyce NCE, Georgette Shell Other Clinician: Referring Mischele Detter: Treating Aryahi Denzler/Extender: Darcey Nora in Treatment: 1 Visit Information History Since Last Visit Added or deleted any medications: No Patient Arrived: Walker Any new allergies or adverse reactions: No Arrival Time: 09:14 Had a fall or experienced change in No Accompanied By: Self activities of daily living that may affect Transfer Assistance: None risk of falls: Patient Identification Verified: Yes Signs or symptoms of abuse/neglect since last visito No Secondary Verification Process Completed: Yes Hospitalized since last visit: No Patient Requires Transmission-Based Precautions: No Implantable device outside of the clinic excluding No Patient Has Alerts: Yes cellular tissue based products placed in the center Patient Alerts: R ABI: 1.0 Candace ABI: 1.1 since last visit: Has Dressing in Place as Prescribed: Yes Pain Present Now: Yes Electronic Signature(s) Signed: 03/01/2021 5:13:20 PM By: Deon Pilling Entered By: Deon Pilling on 03/01/2021 09:16:28 -------------------------------------------------------------------------------- Encounter Discharge Information Details Patient Name: Date of Service: Candace A Candace Robinson, [place] RO Candace Candace. 03/01/2021 9:00 A M Medical Record Number: [id number] Patient Account Number: [id number] Date of Birth/Sex: Treating RN: Jun 24, 1966 (55 y.o. Candace Robinson Primary Care Merridith Dershem: Saul Fordyce NCE, Georgette Shell Other Clinician: Referring Mavery Milling: Treating Jaeveon Ashland/Extender: Darcey Nora in Treatment: 1 Encounter Discharge  Information Items Post Procedure Vitals Discharge Condition: Stable Temperature (F): 98.7 Ambulatory Status: Walker Pulse (bpm): 67 Discharge Destination: Home Respiratory Rate (breaths/min): 18 Transportation: Other Blood Pressure (mmHg): 121/76 Accompanied By: self Schedule Follow-up Appointment: Yes Clinical Summary of Care: Patient Declined Notes transportation service Electronic Signature(s) Signed: 03/01/2021 5:42:12 PM By: Baruch Gouty RN, BSN Entered By: Baruch Gouty on 03/01/2021 11:06:38 -------------------------------------------------------------------------------- Lower Extremity Assessment Details Patient Name: Date of Service: Candace Robinson, CA RO Candace Candace. 03/01/2021 9:00 A M Medical Record Number: [id number] Patient Account Number: [id number] Date of Birth/Sex: Treating RN: 04/17/1966 (55 y.o. Candace Robinson Primary Care Jeramie Scogin: Saul Fordyce NCE, Georgette Shell Other Clinician: Referring Lakeita Panther: Treating Christepher Melchior/Extender: Darcey Nora in Treatment: 1 Edema Assessment Assessed: [Left: No] [Right: No] Edema: [Left: Yes] [Right: Yes] Calf Left: Right: Point of Measurement: 29 cm From Medial Instep 37 cm 37.5 cm Ankle Left: Right: Point of Measurement: 10 cm From Medial Instep 23.7 cm 24 cm Vascular Assessment Pulses: Dorsalis Pedis Palpable: [Left:Yes] [Right:Yes] Electronic Signature(s) Signed: 03/01/2021 5:42:12 PM By: Baruch Gouty RN, BSN Entered By: Baruch Gouty on 03/01/2021 09:53:23 -------------------------------------------------------------------------------- Multi Wound Chart Details Patient Name: Date of Service: Candace Robinson, CA RO Candace Candace. 03/01/2021 9:00 A M Medical Record Number: [id number] Patient Account Number: [id number] Date of Birth/Sex: Treating RN: 03-Mar-1966 (55 y.o. Candace Robinson Primary Care Prajwal Fellner: Saul Fordyce NCE, Georgette Shell Other Clinician: Referring Coran Dipaola: Treating Annmargaret Decaprio/Extender:  Darcey Nora in Treatment: 1 Vital Signs Height(in): 65 Pulse(bpm): 40 Weight(lbs): 157 Blood Pressure(mmHg): 121/76 Body Mass Index(BMI): 28 Temperature(F): 98.7 Respiratory Rate(breaths/min): 18 Photos: [1:Right, Medial Ankle] [2:Right, Dorsal Foot] [3:Left, Medial Ankle] Wound Location: [1:Gradually Appeared] [2:Gradually Appeared] [3:Gradually Appeared] Wounding Event: [1:Diabetic Wound/Ulcer of the Lower] [2:Diabetic Wound/Ulcer of the Lower] [3:Diabetic Wound/Ulcer of the Lower] Primary Etiology: [1:Extremity Anemia, Arrhythmia, Congestive Heart Anemia, Arrhythmia, Congestive Heart Anemia,  Arrhythmia, Congestive Heart] [2:Extremity] [3:Extremity] Comorbid History: [1:Failure, Hypertension, Type II Diabetes, Lupus Erythematosus 04/03/2020] [2:Failure, Hypertension, Type II Diabetes, Lupus Erythematosus 04/03/2020] [3:Failure, Hypertension, Type II Diabetes, Lupus Erythematosus 02/22/2021] Date Acquired: [1:1] [2:1] [3:1] Weeks of Treatment: [1:Open] [2:Open] [3:Open] Wound Status: [1:4.6x3.6x0.1] [2:3x4.5x0.1] [3:0.1x0.1x0.1] Measurements Candace Robinson Candace Robinson Candace (cm) [1:13.006] [2:10.603] [3:0.008] A (cm) : rea [1:1.301] [2:1.06] [3:0.001] Volume (cm) : [1:21.10%] [2:10.00%] [3:99.60%] % Reduction in A [1:rea: 60.60%] [2:10.00%] [3:99.70%] % Reduction in Volume: [1:Grade 2] [2:Grade 2] [3:Grade 2] Classification: [1:Medium] [2:Medium] [3:Small] Exudate A mount: [1:Serosanguineous] [2:Serosanguineous] [3:Serosanguineous] Exudate Type: [1:red, brown] [2:red, brown] [3:red, brown] Exudate Color: [1:Flat and Intact] [2:Flat and Intact] [3:Flat and Intact] Wound Margin: [1:Small (1-33%)] [2:Medium (34-66%)] [3:None Present (0%)] Granulation A mount: [1:Red] [2:Red] [3:N/A] Granulation Quality: [1:Large (67-100%)] [2:Medium (34-66%)] [3:None Present (0%)] Necrotic A mount: [1:Eschar, Adherent Slough] [2:Adherent Slough] [3:N/A] Necrotic Tissue: [1:Fat Layer  (Subcutaneous Tissue): Yes Fat Layer (Subcutaneous Tissue): Yes Fat Layer (Subcutaneous Tissue): Yes] Exposed Structures: [1:Fascia: No Tendon: No Muscle: No Joint: No Bone: No None] [2:Fascia: No Tendon: No Muscle: No Joint: No Bone: No None] [3:Fascia: No Tendon: No Muscle: No Joint: No Bone: No Large (67-100%)] Epithelialization: [1:N/A] [2:N/A] [3:N/A] Debridement: [1:N/A] [2:N/A] [3:N/A] Instrument: [1:N/A] [2:N/A] [3:N/A] Bleeding: Debridement Treatment Response: N/A [2:N/A] [3:N/A] Post Debridement Measurements Candace Robinson N/A [2:N/A] [3:N/A] Candace Robinson Candace (cm) [1:N/A] [2:N/A] [3:N/A] Post Debridement Volume: (cm) [1:N/A] [2:N/A] [3:N/A] Wound Number: '4 5 6 '$ Photos: Left, Lateral Ankle Left, Dorsal Foot Left, Medial Foot Wound Location: Gradually Appeared Gradually Appeared Gradually Appeared Wounding Event: Diabetic Wound/Ulcer of the Lower Diabetic Wound/Ulcer of the Lower Diabetic Wound/Ulcer of the Lower Primary Etiology: Extremity Extremity Extremity Anemia, Arrhythmia, Congestive Heart Anemia, Arrhythmia, Congestive Heart Anemia, Arrhythmia, Congestive Heart Comorbid History: Failure, Hypertension, Type II Failure, Hypertension, Type II Failure, Hypertension, Type II Diabetes, Lupus Erythematosus Diabetes, Lupus Erythematosus Diabetes, Lupus Erythematosus 04/03/2020 04/03/2020 04/03/2020 Date Acquired: '1 1 1 '$ Weeks of Treatment: Open Open Open Wound Status: 7x5.6x0.2 8.5x6.5x0.1 0.3x0.7x0.1 Measurements Candace Robinson Candace Robinson Candace (cm) 30.788 43.393 0.165 A (cm) : rea 6.158 4.339 0.016 Volume (cm) : 18.30% 27.80% 47.50% % Reduction in A rea: 18.30% 27.80% 48.40% % Reduction in Volume: Grade 2 Unable to visualize wound bed Grade 2 Classification: Medium Medium None Present Exudate A mount: Serosanguineous Serosanguineous N/A Exudate Type: red, brown red, brown N/A Exudate Color: Flat and Intact Distinct, outline attached Flat and Intact Wound Margin: Medium (34-66%) Small (1-33%) None  Present (0%) Granulation A mount: Red Red N/A Granulation Quality: Medium (34-66%) Large (67-100%) Large (67-100%) Necrotic A mount: Adherent Slough Eschar, Adherent Slough Eschar Necrotic Tissue: Fat Layer (Subcutaneous Tissue): Yes Fat Layer (Subcutaneous Tissue): Yes Fat Layer (Subcutaneous Tissue): Yes Exposed Structures: Fascia: No Fascia: No Fascia: No Tendon: No Tendon: No Tendon: No Muscle: No Muscle: No Muscle: No Joint: No Joint: No Joint: No Bone: No Bone: No Bone: No None None Medium (34-66%) Epithelialization: N/A Chemical/Enzymatic/Mechanical N/A Debridement: Pre-procedure Verification/Time Out N/A 10:24 N/A Taken: N/A N/A N/A Instrument: N/A None N/A Bleeding: N/A Procedure was tolerated well N/A Debridement Treatment Response: N/A 8.5x6.5x0.1 N/A Post Debridement Measurements Candace Robinson Candace Robinson Candace (cm) N/A 4.339 N/A Post Debridement Volume: (cm) N/A Debridement N/A Procedures Performed: Treatment Notes Wound #1 (Ankle) Wound Laterality: Right, Medial Cleanser Soap and Water Discharge Instruction: May shower and wash wound with dial antibacterial soap and water prior to dressing change. Wound Cleanser Discharge Instruction: Cleanse the wound with wound cleanser prior to applying a  clean dressing using gauze sponges, not tissue or cotton balls. Peri-Wound Care Topical Primary Dressing KerraCel Ag Gelling Fiber Dressing, 4x5 in (silver alginate) Discharge Instruction: Apply silver alginate to wound bed as instructed Santyl Ointment Discharge Instruction: Apply nickel thick amount to wound bed as instructed Secondary Dressing Woven Gauze Sponge, Non-Sterile 4x4 in Discharge Instruction: Apply over primary dressing as directed. ABD Pad, 8x10 Discharge Instruction: Apply over primary dressing as directed. Secured With Elastic Bandage 4 inch (ACE bandage) Discharge Instruction: Secure with ACE bandage as directed. Kerlix Roll Sterile, 4.5x3.1  (in/yd) Discharge Instruction: Secure with Kerlix as directed. Transpore Surgical Tape, 2x10 (in/yd) Discharge Instruction: Secure dressing with tape as directed. Compression Wrap Compression Stockings Add-Ons Wound #2 (Foot) Wound Laterality: Dorsal, Right Cleanser Soap and Water Discharge Instruction: May shower and wash wound with dial antibacterial soap and water prior to dressing change. Wound Cleanser Discharge Instruction: Cleanse the wound with wound cleanser prior to applying a clean dressing using gauze sponges, not tissue or cotton balls. Peri-Wound Care Topical Primary Dressing KerraCel Ag Gelling Fiber Dressing, 4x5 in (silver alginate) Discharge Instruction: Apply silver alginate to wound bed as instructed Santyl Ointment Discharge Instruction: Apply nickel thick amount to wound bed as instructed Secondary Dressing Woven Gauze Sponge, Non-Sterile 4x4 in Discharge Instruction: Apply over primary dressing as directed. ABD Pad, 8x10 Discharge Instruction: Apply over primary dressing as directed. Secured With Elastic Bandage 4 inch (ACE bandage) Discharge Instruction: Secure with ACE bandage as directed. Kerlix Roll Sterile, 4.5x3.1 (in/yd) Discharge Instruction: Secure with Kerlix as directed. Transpore Surgical Tape, 2x10 (in/yd) Discharge Instruction: Secure dressing with tape as directed. Compression Wrap Compression Stockings Add-Ons Wound #3 (Ankle) Wound Laterality: Left, Medial Cleanser Soap and Water Discharge Instruction: May shower and wash wound with dial antibacterial soap and water prior to dressing change. Wound Cleanser Discharge Instruction: Cleanse the wound with wound cleanser prior to applying a clean dressing using gauze sponges, not tissue or cotton balls. Peri-Wound Care Topical Primary Dressing KerraCel Ag Gelling Fiber Dressing, 4x5 in (silver alginate) Discharge Instruction: Apply silver alginate to wound bed as instructed Santyl  Ointment Discharge Instruction: Apply nickel thick amount to wound bed as instructed Secondary Dressing Woven Gauze Sponge, Non-Sterile 4x4 in Discharge Instruction: Apply over primary dressing as directed. ABD Pad, 8x10 Discharge Instruction: Apply over primary dressing as directed. Secured With Elastic Bandage 4 inch (ACE bandage) Discharge Instruction: Secure with ACE bandage as directed. Kerlix Roll Sterile, 4.5x3.1 (in/yd) Discharge Instruction: Secure with Kerlix as directed. Transpore Surgical Tape, 2x10 (in/yd) Discharge Instruction: Secure dressing with tape as directed. Compression Wrap Compression Stockings Add-Ons Wound #4 (Ankle) Wound Laterality: Left, Lateral Cleanser Soap and Water Discharge Instruction: May shower and wash wound with dial antibacterial soap and water prior to dressing change. Wound Cleanser Discharge Instruction: Cleanse the wound with wound cleanser prior to applying a clean dressing using gauze sponges, not tissue or cotton balls. Peri-Wound Care Topical Primary Dressing KerraCel Ag Gelling Fiber Dressing, 4x5 in (silver alginate) Discharge Instruction: Apply silver alginate to wound bed as instructed Santyl Ointment Discharge Instruction: Apply nickel thick amount to wound bed as instructed Secondary Dressing Woven Gauze Sponge, Non-Sterile 4x4 in Discharge Instruction: Apply over primary dressing as directed. ABD Pad, 8x10 Discharge Instruction: Apply over primary dressing as directed. Secured With Elastic Bandage 4 inch (ACE bandage) Discharge Instruction: Secure with ACE bandage as directed. Kerlix Roll Sterile, 4.5x3.1 (in/yd) Discharge Instruction: Secure with Kerlix as directed. Transpore Surgical Tape, 2x10 (in/yd) Discharge Instruction: Secure dressing with  tape as directed. Compression Wrap Compression Stockings Add-Ons Wound #5 (Foot) Wound Laterality: Dorsal, Left Cleanser Soap and Water Discharge Instruction: May shower  and wash wound with dial antibacterial soap and water prior to dressing change. Wound Cleanser Discharge Instruction: Cleanse the wound with wound cleanser prior to applying a clean dressing using gauze sponges, not tissue or cotton balls. Peri-Wound Care Topical Primary Dressing KerraCel Ag Gelling Fiber Dressing, 4x5 in (silver alginate) Discharge Instruction: Apply silver alginate to wound bed as instructed Santyl Ointment Discharge Instruction: Apply nickel thick amount to wound bed as instructed Secondary Dressing Woven Gauze Sponge, Non-Sterile 4x4 in Discharge Instruction: Apply over primary dressing as directed. ABD Pad, 8x10 Discharge Instruction: Apply over primary dressing as directed. Secured With Elastic Bandage 4 inch (ACE bandage) Discharge Instruction: Secure with ACE bandage as directed. Kerlix Roll Sterile, 4.5x3.1 (in/yd) Discharge Instruction: Secure with Kerlix as directed. Transpore Surgical Tape, 2x10 (in/yd) Discharge Instruction: Secure dressing with tape as directed. Compression Wrap Compression Stockings Add-Ons Wound #6 (Foot) Wound Laterality: Left, Medial Cleanser Soap and Water Discharge Instruction: May shower and wash wound with dial antibacterial soap and water prior to dressing change. Wound Cleanser Discharge Instruction: Cleanse the wound with wound cleanser prior to applying a clean dressing using gauze sponges, not tissue or cotton balls. Peri-Wound Care Topical Primary Dressing KerraCel Ag Gelling Fiber Dressing, 4x5 in (silver alginate) Discharge Instruction: Apply silver alginate to wound bed as instructed Santyl Ointment Discharge Instruction: Apply nickel thick amount to wound bed as instructed Secondary Dressing Woven Gauze Sponge, Non-Sterile 4x4 in Discharge Instruction: Apply over primary dressing as directed. ABD Pad, 8x10 Discharge Instruction: Apply over primary dressing as directed. Secured With Elastic Bandage 4 inch  (ACE bandage) Discharge Instruction: Secure with ACE bandage as directed. Kerlix Roll Sterile, 4.5x3.1 (in/yd) Discharge Instruction: Secure with Kerlix as directed. Transpore Surgical Tape, 2x10 (in/yd) Discharge Instruction: Secure dressing with tape as directed. Compression Wrap Compression Stockings Add-Ons Electronic Signature(s) Signed: 03/01/2021 11:24:15 AM By: Kalman Shan DO Signed: 03/01/2021 5:25:35 PM By: Lorrin Jackson Entered By: Kalman Shan on 03/01/2021 11:16:35 -------------------------------------------------------------------------------- Multi-Disciplinary Care Plan Details Patient Name: Date of Service: Candace Robinson, [place] RO Candace Candace. 03/01/2021 9:00 A M Medical Record Number: [id number] Patient Account Number: [id number] Date of Birth/Sex: Treating RN: Oct 01, 1965 (55 y.o. Candace Robinson Primary Care Fiorela Pelzer: Saul Fordyce NCE, RO Oneita Hurt Other Clinician: Referring Shadee Montoya: Treating Roza Creamer/Extender: Darcey Nora in Treatment: 1 Active Inactive Tissue Oxygenation Nursing Diagnoses: Actual ineffective tissue perfusion; peripheral (select once diagnosis is confirmed) Goals: Patient/caregiver will verbalize understanding of disease process and disease management Date Initiated: 02/22/2021 Target Resolution Date: 03/29/2021 Goal Status: Active Interventions: Assess patient understanding of disease process and management upon diagnosis and as needed Assess peripheral arterial status upon admission and as needed Provide education on tissue oxygenation and ischemia Treatment Activities: T ordered outside of clinic : 02/22/2021 est Notes: Wound/Skin Impairment Nursing Diagnoses: Impaired tissue integrity Goals: Patient/caregiver will verbalize understanding of skin care regimen Date Initiated: 02/22/2021 Target Resolution Date: 03/22/2021 Goal Status: Active Ulcer/skin breakdown will have a volume reduction of 30% by week 4 Date  Initiated: 02/22/2021 Target Resolution Date: 03/22/2021 Goal Status: Active Interventions: Assess patient/caregiver ability to obtain necessary supplies Assess patient/caregiver ability to perform ulcer/skin care regimen upon admission and as needed Assess ulceration(s) every visit Provide education on ulcer and skin care Treatment Activities: Topical wound management initiated : 02/22/2021 Notes: Electronic Signature(s) Signed: 03/01/2021 9:32:15 AM By: Fara Chute  By: Lorrin Jackson on 03/01/2021 09:32:14 -------------------------------------------------------------------------------- Pain Assessment Details Patient Name: Date of Service: Candace Robinson, CA RO Candace Candace. 03/01/2021 9:00 A M Medical Record Number: [id number] Patient Account Number: [id number] Date of Birth/Sex: Treating RN: 1965/11/27 (55 y.o. Candace Robinson Primary Care Adeja Sarratt: Saul Fordyce NCE, Georgette Shell Other Clinician: Referring Lenda Baratta: Treating Jasline Buskirk/Extender: Darcey Nora in Treatment: 1 Active Problems Location of Pain Severity and Description of Pain Patient Has Paino Yes Site Locations Rate the pain. Current Pain Level: 4 Pain Management and Medication Current Pain Management: Electronic Signature(s) Signed: 03/01/2021 5:13:20 PM By: Deon Pilling Entered By: Deon Pilling on 03/01/2021 09:17:02 -------------------------------------------------------------------------------- Patient/Caregiver Education Details Patient Name: Date of Service: Candace A DNA Rhunette Croft, [place] RO Candace Candace. 8/29/2022andnbsp9:00 A M Medical Record Number: [id number] Patient Account Number: [id number] Date of Birth/Gender: Treating RN: May 12, 1966 (55 y.o. Candace Robinson Primary Care Physician: Saul Fordyce NCE, RO Oneita Hurt Other Clinician: Referring Physician: Treating Physician/Extender: Darcey Nora in Treatment: 1 Education Assessment Education Provided  To: Patient Education Topics Provided Tissue Oxygenation: Handouts: Skin Perfusion Tests Methods: Explain/Verbal Responses: State content correctly Wound/Skin Impairment: Methods: Explain/Verbal, Printed Responses: State content correctly Electronic Signature(s) Signed: 03/01/2021 5:25:35 PM By: Lorrin Jackson Entered By: Lorrin Jackson on 03/01/2021 09:32:43 -------------------------------------------------------------------------------- Wound Assessment Details Patient Name: Date of Service: Candace Robinson, CA RO Candace Candace. 03/01/2021 9:00 A M Medical Record Number: [id number] Patient Account Number: [id number] Date of Birth/Sex: Treating RN: Sep 27, 1965 (55 y.o. Candace Robinson, Meta.Reding Primary Care [name]: Saul Fordyce NCE, RO Oneita Hurt Other Clinician: Referring Jonice Cerra: Treating Riggin Cuttino/Extender: Darcey Nora in Treatment: 1 Wound Status Wound Number: 1 Primary Diabetic Wound/Ulcer of the Lower Extremity Etiology: Wound Location: Right, Medial Ankle Wound Open Wounding Event: Gradually Appeared Status: Date Acquired: 04/03/2020 Comorbid Anemia, Arrhythmia, Congestive Heart Failure, Hypertension, Weeks Of Treatment: 1 History: Type II Diabetes, Lupus Erythematosus Clustered Wound: No Photos Wound Measurements Length: (cm) 4.6 Width: (cm) 3.6 Depth: (cm) 0.1 Area: (cm) 13.006 Volume: (cm) 1.301 % Reduction in Area: 21.1% % Reduction in Volume: 60.6% Epithelialization: None Tunneling: No Undermining: No Wound Description Classification: Grade 2 Wound Margin: Flat and Intact Exudate Amount: Medium Exudate Type: Serosanguineous Exudate Color: red, brown Foul Odor After Cleansing: No Slough/Fibrino Yes Wound Bed Granulation Amount: Small (1-33%) Exposed Structure Granulation Quality: Red Fascia Exposed: No Necrotic Amount: Large (67-100%) Fat Layer (Subcutaneous Tissue) Exposed: Yes Necrotic Quality: Eschar, Adherent Slough Tendon Exposed:  No Muscle Exposed: No Joint Exposed: No Bone Exposed: No Treatment Notes Wound #1 (Ankle) Wound Laterality: Right, Medial Cleanser Soap and Water Discharge Instruction: May shower and wash wound with dial antibacterial soap and water prior to dressing change. Wound Cleanser Discharge Instruction: Cleanse the wound with wound cleanser prior to applying a clean dressing using gauze sponges, not tissue or cotton balls. Peri-Wound Care Topical Primary Dressing KerraCel Ag Gelling Fiber Dressing, 4x5 in (silver alginate) Discharge Instruction: Apply silver alginate to wound bed as instructed Santyl Ointment Discharge Instruction: Apply nickel thick amount to wound bed as instructed Secondary Dressing Woven Gauze Sponge, Non-Sterile 4x4 in Discharge Instruction: Apply over primary dressing as directed. ABD Pad, 8x10 Discharge Instruction: Apply over primary dressing as directed. Secured With Elastic Bandage 4 inch (ACE bandage) Discharge Instruction: Secure with ACE bandage as directed. Kerlix Roll Sterile, 4.5x3.1 (in/yd) Discharge Instruction: Secure with Kerlix as directed. Transpore Surgical Tape, 2x10 (in/yd) Discharge Instruction: Secure dressing with tape as directed. Compression Wrap Compression  Stockings Environmental education officer) Signed: 03/01/2021 5:13:20 PM By: Deon Pilling Signed: 03/01/2021 5:42:12 PM By: Baruch Gouty RN, BSN Entered By: Baruch Gouty on 03/01/2021 09:54:01 -------------------------------------------------------------------------------- Wound Assessment Details Patient Name: Date of Service: Candace Robinson, CA RO Candace Candace. 03/01/2021 9:00 A M Medical Record Number: [id number] Patient Account Number: [id number] Date of Birth/Sex: Treating RN: [birth year] (55 y.o. Candace Robinson, Meta.Reding Primary Care Lucian Baswell: Saul Fordyce NCE, RO Oneita Hurt Other Clinician: Referring Aidel Davisson: Treating Keagen Heinlen/Extender: Darcey Nora in Treatment:  1 Wound Status Wound Number: 2 Primary Diabetic Wound/Ulcer of the Lower Extremity Etiology: Wound Location: Right, Dorsal Foot Wound Open Wounding Event: Gradually Appeared Status: Date Acquired: 04/03/2020 Comorbid Anemia, Arrhythmia, Congestive Heart Failure, Hypertension, Weeks Of Treatment: 1 History: Type II Diabetes, Lupus Erythematosus Clustered Wound: No Photos Wound Measurements Length: (cm) 3 Width: (cm) 4.5 Depth: (cm) 0.1 Area: (cm) 10.603 Volume: (cm) 1.06 % Reduction in Area: 10% % Reduction in Volume: 10% Epithelialization: None Tunneling: No Undermining: No Wound Description Classification: Grade 2 Wound Margin: Flat and Intact Exudate Amount: Medium Exudate Type: Serosanguineous Exudate Color: red, brown Foul Odor After Cleansing: No Slough/Fibrino Yes Wound Bed Granulation Amount: Medium (34-66%) Exposed Structure Granulation Quality: Red Fascia Exposed: No Necrotic Amount: Medium (34-66%) Fat Layer (Subcutaneous Tissue) Exposed: Yes Necrotic Quality: Adherent Slough Tendon Exposed: No Muscle Exposed: No Joint Exposed: No Bone Exposed: No Treatment Notes Wound #2 (Foot) Wound Laterality: Dorsal, Right Cleanser Soap and Water Discharge Instruction: May shower and wash wound with dial antibacterial soap and water prior to dressing change. Wound Cleanser Discharge Instruction: Cleanse the wound with wound cleanser prior to applying a clean dressing using gauze sponges, not tissue or cotton balls. Peri-Wound Care Topical Primary Dressing KerraCel Ag Gelling Fiber Dressing, 4x5 in (silver alginate) Discharge Instruction: Apply silver alginate to wound bed as instructed Santyl Ointment Discharge Instruction: Apply nickel thick amount to wound bed as instructed Secondary Dressing Woven Gauze Sponge, Non-Sterile 4x4 in Discharge Instruction: Apply over primary dressing as directed. ABD Pad, 8x10 Discharge Instruction: Apply over primary  dressing as directed. Secured With Elastic Bandage 4 inch (ACE bandage) Discharge Instruction: Secure with ACE bandage as directed. Kerlix Roll Sterile, 4.5x3.1 (in/yd) Discharge Instruction: Secure with Kerlix as directed. Transpore Surgical Tape, 2x10 (in/yd) Discharge Instruction: Secure dressing with tape as directed. Compression Wrap Compression Stockings Add-Ons Electronic Signature(s) Signed: 03/01/2021 5:13:20 PM By: Deon Pilling Signed: 03/01/2021 5:42:12 PM By: Baruch Gouty RN, BSN Entered By: Baruch Gouty on 03/01/2021 09:54:28 -------------------------------------------------------------------------------- Wound Assessment Details Patient Name: Date of Service: Candace Robinson, CA RO Candace Candace. 03/01/2021 9:00 A M Medical Record Number: [id number] Patient Account Number: [id number] Date of Birth/Sex: Treating RN: 09-May-1966 (55 y.o. Candace Robinson, Meta.Reding Primary Care Torris House: Saul Fordyce NCE, Georgette Shell Other Clinician: Referring Markies Mowatt: Treating Javayah Magaw/Extender: Darcey Nora in Treatment: 1 Wound Status Wound Number: 3 Primary Diabetic Wound/Ulcer of the Lower Extremity Etiology: Wound Location: Left, Medial Ankle Wound Open Wounding Event: Gradually Appeared Status: Date Acquired: 02/22/2021 Comorbid Anemia, Arrhythmia, Congestive Heart Failure, Hypertension, Weeks Of Treatment: 1 History: Type II Diabetes, Lupus Erythematosus Clustered Wound: No Photos Wound Measurements Length: (cm) 0.1 Width: (cm) 0.1 Depth: (cm) 0.1 Area: (cm) 0.008 Volume: (cm) 0.001 % Reduction in Area: 99.6% % Reduction in Volume: 99.7% Epithelialization: Large (67-100%) Tunneling: No Undermining: No Wound Description Classification: Grade 2 Wound Margin: Flat and Intact Exudate Amount: Small Exudate Type: Serosanguineous Exudate Color: red, brown Foul Odor After Cleansing:  No Slough/Fibrino Yes Wound Bed Granulation Amount: None Present (0%)  Exposed Structure Necrotic Amount: None Present (0%) Fascia Exposed: No Fat Layer (Subcutaneous Tissue) Exposed: Yes Tendon Exposed: No Muscle Exposed: No Joint Exposed: No Bone Exposed: No Treatment Notes Wound #3 (Ankle) Wound Laterality: Left, Medial Cleanser Soap and Water Discharge Instruction: May shower and wash wound with dial antibacterial soap and water prior to dressing change. Wound Cleanser Discharge Instruction: Cleanse the wound with wound cleanser prior to applying a clean dressing using gauze sponges, not tissue or cotton balls. Peri-Wound Care Topical Primary Dressing KerraCel Ag Gelling Fiber Dressing, 4x5 in (silver alginate) Discharge Instruction: Apply silver alginate to wound bed as instructed Santyl Ointment Discharge Instruction: Apply nickel thick amount to wound bed as instructed Secondary Dressing Woven Gauze Sponge, Non-Sterile 4x4 in Discharge Instruction: Apply over primary dressing as directed. ABD Pad, 8x10 Discharge Instruction: Apply over primary dressing as directed. Secured With Elastic Bandage 4 inch (ACE bandage) Discharge Instruction: Secure with ACE bandage as directed. Kerlix Roll Sterile, 4.5x3.1 (in/yd) Discharge Instruction: Secure with Kerlix as directed. Transpore Surgical Tape, 2x10 (in/yd) Discharge Instruction: Secure dressing with tape as directed. Compression Wrap Compression Stockings Add-Ons Electronic Signature(s) Signed: 03/01/2021 5:13:20 PM By: Deon Pilling Signed: 03/01/2021 5:42:12 PM By: Baruch Gouty RN, BSN Entered By: Baruch Gouty on 03/01/2021 09:55:13 -------------------------------------------------------------------------------- Wound Assessment Details Patient Name: Date of Service: Candace Robinson, CA RO Candace Candace. 03/01/2021 9:00 A M Medical Record Number: [id number] Patient Account Number: [id number] Date of Birth/Sex: Treating RN: 09-23-1965 (55 y.o. Candace Robinson, Meta.Reding Primary Care Delphine Sizemore: Saul Fordyce NCE, Georgette Shell Other Clinician: Referring Dane Bloch: Treating Nicollette Wilhelmi/Extender: Darcey Nora in Treatment: 1 Wound Status Wound Number: 4 Primary Diabetic Wound/Ulcer of the Lower Extremity Etiology: Wound Location: Left, Lateral Ankle Wound Open Wounding Event: Gradually Appeared Status: Date Acquired: 04/03/2020 Comorbid Anemia, Arrhythmia, Congestive Heart Failure, Hypertension, Weeks Of Treatment: 1 History: Type II Diabetes, Lupus Erythematosus Clustered Wound: No Photos Wound Measurements Length: (cm) 7 Width: (cm) 5.6 Depth: (cm) 0.2 Area: (cm) 30.788 Volume: (cm) 6.158 % Reduction in Area: 18.3% % Reduction in Volume: 18.3% Epithelialization: None Tunneling: No Undermining: No Wound Description Classification: Grade 2 Wound Margin: Flat and Intact Exudate Amount: Medium Exudate Type: Serosanguineous Exudate Color: red, brown Foul Odor After Cleansing: No Slough/Fibrino Yes Wound Bed Granulation Amount: Medium (34-66%) Exposed Structure Granulation Quality: Red Fascia Exposed: No Necrotic Amount: Medium (34-66%) Fat Layer (Subcutaneous Tissue) Exposed: Yes Necrotic Quality: Adherent Slough Tendon Exposed: No Muscle Exposed: No Joint Exposed: No Bone Exposed: No Treatment Notes Wound #4 (Ankle) Wound Laterality: Left, Lateral Cleanser Soap and Water Discharge Instruction: May shower and wash wound with dial antibacterial soap and water prior to dressing change. Wound Cleanser Discharge Instruction: Cleanse the wound with wound cleanser prior to applying a clean dressing using gauze sponges, not tissue or cotton balls. Peri-Wound Care Topical Primary Dressing KerraCel Ag Gelling Fiber Dressing, 4x5 in (silver alginate) Discharge Instruction: Apply silver alginate to wound bed as instructed Santyl Ointment Discharge Instruction: Apply nickel thick amount to wound bed as instructed Secondary Dressing Woven Gauze Sponge,  Non-Sterile 4x4 in Discharge Instruction: Apply over primary dressing as directed. ABD Pad, 8x10 Discharge Instruction: Apply over primary dressing as directed. Secured With Elastic Bandage 4 inch (ACE bandage) Discharge Instruction: Secure with ACE bandage as directed. Kerlix Roll Sterile, 4.5x3.1 (in/yd) Discharge Instruction: Secure with Kerlix as directed. Transpore Surgical Tape, 2x10 (in/yd) Discharge Instruction: Secure dressing with tape as  directed. Compression Wrap Compression Stockings Add-Ons Electronic Signature(s) Signed: 03/01/2021 5:13:20 PM By: Deon Pilling Signed: 03/01/2021 5:42:12 PM By: Baruch Gouty RN, BSN Entered By: Baruch Gouty on 03/01/2021 09:55:52 -------------------------------------------------------------------------------- Wound Assessment Details Patient Name: Date of Service: Candace Robinson, CA RO Candace Candace. 03/01/2021 9:00 A M Medical Record Number: [id number] Patient Account Number: [id number] Date of Birth/Sex: Treating RN: 1966-02-10 (55 y.o. Candace Robinson, Meta.Reding Primary Care Nyjai Graff: Saul Fordyce NCE, RO Oneita Hurt Other Clinician: Referring Ryliee Figge: Treating Annesha Delgreco/Extender: Darcey Nora in Treatment: 1 Wound Status Wound Number: 5 Primary Diabetic Wound/Ulcer of the Lower Extremity Etiology: Wound Location: Left, Dorsal Foot Wound Open Wounding Event: Gradually Appeared Status: Date Acquired: 04/03/2020 Comorbid Anemia, Arrhythmia, Congestive Heart Failure, Hypertension, Weeks Of Treatment: 1 History: Type II Diabetes, Lupus Erythematosus Clustered Wound: No Photos Wound Measurements Length: (cm) 8.5 Width: (cm) 6.5 Depth: (cm) 0.1 Area: (cm) 43.393 Volume: (cm) 4.339 % Reduction in Area: 27.8% % Reduction in Volume: 27.8% Epithelialization: None Tunneling: No Undermining: No Wound Description Classification: Unable to visualize wound bed Wound Margin: Distinct, outline attached Exudate Amount:  Medium Exudate Type: Serosanguineous Exudate Color: red, brown Foul Odor After Cleansing: No Slough/Fibrino Yes Wound Bed Granulation Amount: Small (1-33%) Exposed Structure Granulation Quality: Red Fascia Exposed: No Necrotic Amount: Large (67-100%) Fat Layer (Subcutaneous Tissue) Exposed: Yes Necrotic Quality: Eschar, Adherent Slough Tendon Exposed: No Muscle Exposed: No Joint Exposed: No Bone Exposed: No Treatment Notes Wound #5 (Foot) Wound Laterality: Dorsal, Left Cleanser Soap and Water Discharge Instruction: May shower and wash wound with dial antibacterial soap and water prior to dressing change. Wound Cleanser Discharge Instruction: Cleanse the wound with wound cleanser prior to applying a clean dressing using gauze sponges, not tissue or cotton balls. Peri-Wound Care Topical Primary Dressing KerraCel Ag Gelling Fiber Dressing, 4x5 in (silver alginate) Discharge Instruction: Apply silver alginate to wound bed as instructed Santyl Ointment Discharge Instruction: Apply nickel thick amount to wound bed as instructed Secondary Dressing Woven Gauze Sponge, Non-Sterile 4x4 in Discharge Instruction: Apply over primary dressing as directed. ABD Pad, 8x10 Discharge Instruction: Apply over primary dressing as directed. Secured With Elastic Bandage 4 inch (ACE bandage) Discharge Instruction: Secure with ACE bandage as directed. Kerlix Roll Sterile, 4.5x3.1 (in/yd) Discharge Instruction: Secure with Kerlix as directed. Transpore Surgical Tape, 2x10 (in/yd) Discharge Instruction: Secure dressing with tape as directed. Compression Wrap Compression Stockings Add-Ons Electronic Signature(s) Signed: 03/01/2021 5:13:20 PM By: Deon Pilling Signed: 03/01/2021 5:42:12 PM By: Baruch Gouty RN, BSN Entered By: Baruch Gouty on 03/01/2021 09:56:34 -------------------------------------------------------------------------------- Wound Assessment Details Patient Name: Date of  Service: Candace Robinson, CA RO Candace Candace. 03/01/2021 9:00 A M Medical Record Number: [id number] Patient Account Number: [id number] Date of Birth/Sex: Treating RN: 07-29-1965 (55 y.o. Candace Robinson, Meta.Reding Primary Care Sawyer Mentzer: Saul Fordyce NCE, Georgette Shell Other Clinician: Referring Denine Brotz: Treating Wasil Wolke/Extender: Darcey Nora in Treatment: 1 Wound Status Wound Number: 6 Primary Diabetic Wound/Ulcer of the Lower Extremity Etiology: Wound Location: Left, Medial Foot Wound Open Wounding Event: Gradually Appeared Status: Date Acquired: 04/03/2020 Comorbid Anemia, Arrhythmia, Congestive Heart Failure, Hypertension, Weeks Of Treatment: 1 Weeks Of Treatment: 1 History: Type II Diabetes, Lupus Erythematosus Clustered Wound: No Photos Wound Measurements Length: (cm) 0.3 Width: (cm) 0.7 Depth: (cm) 0.1 Area: (cm) 0.165 Volume: (cm) 0.016 % Reduction in Area: 47.5% % Reduction in Volume: 48.4% Epithelialization: Medium (34-66%) Tunneling: No Undermining: No Wound Description Classification: Grade 2 Wound Margin: Flat and Intact Exudate Amount:  None Present Foul Odor After Cleansing: No Slough/Fibrino No Wound Bed Granulation Amount: None Present (0%) Exposed Structure Necrotic Amount: Large (67-100%) Fascia Exposed: No Necrotic Quality: Eschar Fat Layer (Subcutaneous Tissue) Exposed: Yes Tendon Exposed: No Muscle Exposed: No Joint Exposed: No Bone Exposed: No Treatment Notes Wound #6 (Foot) Wound Laterality: Left, Medial Cleanser Soap and Water Discharge Instruction: May shower and wash wound with dial antibacterial soap and water prior to dressing change. Wound Cleanser Discharge Instruction: Cleanse the wound with wound cleanser prior to applying a clean dressing using gauze sponges, not tissue or cotton balls. Peri-Wound Care Topical Primary Dressing KerraCel Ag Gelling Fiber Dressing, 4x5 in (silver alginate) Discharge Instruction: Apply silver  alginate to wound bed as instructed Santyl Ointment Discharge Instruction: Apply nickel thick amount to wound bed as instructed Secondary Dressing Woven Gauze Sponge, Non-Sterile 4x4 in Discharge Instruction: Apply over primary dressing as directed. ABD Pad, 8x10 Discharge Instruction: Apply over primary dressing as directed. Secured With Elastic Bandage 4 inch (ACE bandage) Discharge Instruction: Secure with ACE bandage as directed. Kerlix Roll Sterile, 4.5x3.1 (in/yd) Discharge Instruction: Secure with Kerlix as directed. Transpore Surgical Tape, 2x10 (in/yd) Discharge Instruction: Secure dressing with tape as directed. Compression Wrap Compression Stockings Add-Ons Electronic Signature(s) Signed: 03/01/2021 5:13:20 PM By: Deon Pilling Signed: 03/01/2021 5:42:12 PM By: Baruch Gouty RN, BSN Entered By: Baruch Gouty on 03/01/2021 09:59:51 -------------------------------------------------------------------------------- Vitals Details Patient Name: Date of Service: Candace A Candace Robinson, CA RO Candace Candace. 03/01/2021 9:00 A M Medical Record Number: [id number] Patient Account Number: [id number] Date of Birth/Sex: Treating RN: [birth year] (55 y.o. Candace Robinson, Meta.Reding Primary Care Westlyn Glaza: Saul Fordyce NCE, Georgette Shell Other Clinician: Referring Aili Casillas: Treating Yaqub Arney/Extender: Darcey Nora in Treatment: 1 Vital Signs Time Taken: 09:16 Temperature (F): 98.7 Height (in): 63 Pulse (bpm): 67 Weight (lbs): 157 Respiratory Rate (breaths/min): 18 Body Mass Index (BMI): 27.8 Blood Pressure (mmHg): 121/76 Reference Range: 80 - 120 mg / dl Electronic Signature(s) Signed: 03/01/2021 5:13:20 PM By: Deon Pilling Entered By: Deon Pilling on 03/01/2021 09:16:50

## 2021-03-09 ENCOUNTER — Encounter (HOSPITAL_BASED_OUTPATIENT_CLINIC_OR_DEPARTMENT_OTHER): Payer: Medicaid Other | Admitting: Internal Medicine

## 2021-03-10 ENCOUNTER — Ambulatory Visit (INDEPENDENT_AMBULATORY_CARE_PROVIDER_SITE_OTHER): Payer: Medicaid Other | Admitting: Vascular Surgery

## 2021-03-10 ENCOUNTER — Encounter: Payer: Self-pay | Admitting: Vascular Surgery

## 2021-03-10 ENCOUNTER — Other Ambulatory Visit: Payer: Self-pay

## 2021-03-10 VITALS — BP 130/77 | HR 82 | Temp 98.4°F | Resp 20 | Ht 63.0 in | Wt 165.0 lb

## 2021-03-10 DIAGNOSIS — S81802A Unspecified open wound, left lower leg, initial encounter: Secondary | ICD-10-CM | POA: Diagnosis not present

## 2021-03-10 DIAGNOSIS — S81801A Unspecified open wound, right lower leg, initial encounter: Secondary | ICD-10-CM

## 2021-03-10 NOTE — Progress Notes (Signed)
Patient ID: Candace Robinson, female   DOB: 25-May-1966, 55 y.o.   MRN: [id number]  Reason for Consult: New Patient (Initial Visit)   Referred by [facility] [place]  Subjective:     HPI:  Candace Robinson is a 55 y.o. female with a long history of bilateral lower extremity wounds.  She has had arterial noninvasive studies on several occasions which were boys been normal.  She has had right lower extremity saphenous vein ablation performed at [facility] in 2016.  She has never had any venous intervention on the left.  She is a diabetic.  She has no previous history of vascular disease.  She walks with the help of a walker.  She is currently being treated by the wound care center.  She denies any fevers or chills currently.  Past Medical History:  Diagnosis Date   Anemia    Atrial fibrillation (HCC)    CHF (congestive heart failure) ([place])    Diabetes mellitus without complication (HCC)    Heart murmur    Hypertension    Lupus (HCC)    Thyroid disease    hyperthyroid   Family History  Problem Relation Age of Onset   Hypertension Mother    History reviewed. No pertinent surgical history.  Short Social History:  Social History   Tobacco Use   Smoking status: Never   Smokeless tobacco: Never  Substance Use Topics   Alcohol use: Not Currently    Allergies  Allergen Reactions   Motrin [Ibuprofen] Other (See Comments)    hallucinations    Current Outpatient Medications  Medication Sig Dispense Refill   acetaminophen (TYLENOL) 325 MG tablet Take 2 tablets (650 mg total) by mouth every 6 (six) hours. 30 tablet 0   amLODipine (NORVASC) 10 MG tablet Take 10 mg by mouth daily.     ASPIRIN LOW DOSE 81 MG EC tablet Take 81 mg by mouth daily.     atorvastatin (LIPITOR) 20 MG tablet Take 20 mg by mouth daily.     ferrous sulfate 325 (65 FE) MG tablet Take 1 tablet (325 mg total) by mouth in the morning and at bedtime. 60 tablet 0   hydrOXYzine (ATARAX/VISTARIL) 25 MG  tablet Take 25 mg by mouth every 8 (eight) hours as needed.     lisinopril (ZESTRIL) 10 MG tablet Take 10 mg by mouth daily.     Multiple Vitamins-Minerals (SENTRY SENIOR/LUTEIN PO) Take 1 tablet by mouth daily in the afternoon.     NON FORMULARY Diet Regular     omeprazole (PRILOSEC) 40 MG capsule Take 1 capsule (40 mg total) by mouth daily. 30 capsule 0   traMADol HCl 100 MG TABS Take 1 tablet by mouth as needed.     zinc gluconate 50 MG tablet Take 50 mg by mouth daily.     propranolol (INDERAL) 20 MG tablet Take 1 tablet (20 mg total) by mouth 2 (two) times daily. (Patient not taking: No sig reported) 60 tablet 0   Vitamin D, Ergocalciferol, (DRISDOL) 1.25 MG (50000 UNIT) CAPS capsule Take 1 capsule (50,000 Units total) by mouth every 7 (seven) days. On Sunday (Patient not taking: No sig reported) 5 capsule 0   No current facility-administered medications for this visit.    Review of Systems  Constitutional:  Constitutional negative. HENT: HENT negative.  Eyes: Eyes negative.  Respiratory: Respiratory negative.  Cardiovascular: Positive for leg swelling.  GI: Gastrointestinal negative.  Musculoskeletal: Musculoskeletal negative.  Skin: Positive for wound.  Neurological:  Neurological negative. Hematologic: Hematologic/lymphatic negative.  Psychiatric: Psychiatric negative.       Objective:  Objective   Vitals:   03/10/21 1427  BP: 130/77  Pulse: 82  Resp: 20  Temp: 98.4 F (36.9 C)  SpO2: 96%  Weight: 165 lb (74.8 kg)  Height: '5\' 3"'$  (1.6 m)   Body mass index is 29.23 kg/m.  Physical Exam HENT:     Head: Normocephalic.     Nose:     Comments: Wearing a mask Eyes:     Pupils: Pupils are equal, round, and reactive to light.  Cardiovascular:     Pulses:          Popliteal pulses are 2+ on the right side and 2+ on the left side.       Dorsalis pedis pulses are 2+ on the right side and 2+ on the left side.  Pulmonary:     Effort: Pulmonary effort is normal.   Abdominal:     General: Abdomen is flat.     Palpations: Abdomen is soft.  Skin:    Capillary Refill: Capillary refill takes 2 to 3 seconds.     Comments: As pictured below  Neurological:     General: No focal deficit present.     Mental Status: She is alert.  Psychiatric:        Mood and Affect: Mood normal.        Behavior: Behavior normal.        Thought Content: Thought content normal.       Data: ABI Findings:  +---------+------------------+-----+---------+--------+  Right    Rt Pressure (mmHg)IndexWaveform Comment   +---------+------------------+-----+---------+--------+  Brachial 136                                       +---------+------------------+-----+---------+--------+  PTA                                      ulcer     +---------+------------------+-----+---------+--------+  DP       137               1.01 triphasic          +---------+------------------+-----+---------+--------+  Great Toe                                bandaged  +---------+------------------+-----+---------+--------+   +---------+------------------+-----+---------+--------+  Left     Lt Pressure (mmHg)IndexWaveform Comment   +---------+------------------+-----+---------+--------+  Brachial 129                                       +---------+------------------+-----+---------+--------+  PTA      156               1.15 triphasic          +---------+------------------+-----+---------+--------+  DP       139               1.02 triphasic          +---------+------------------+-----+---------+--------+  Great Toe  bandaged  +---------+------------------+-----+---------+--------+   +-------+-----------+-----------+------------+------------+  ABI/TBIToday's ABIToday's TBIPrevious ABIPrevious TBI  +-------+-----------+-----------+------------+------------+  Right  1.01                  1                          +-------+-----------+-----------+------------+------------+  Left   1.15                  1.1                       +-------+-----------+-----------+------------+------------+      Previous ABI at [facility] 04/29/20.     Summary:  Right: Resting right ankle-brachial index is within normal range. No  evidence of significant right lower extremity arterial disease.   Left: Resting left ankle-brachial index is within normal range. No  evidence of significant left lower extremity arterial disease.       Assessment/Plan:     55 year old female with bilateral foot wounds.  She has previous venous ablation on the right no previous venous ablation on the left.  We will get lower extremity reflux studies for evaluation.  She will continue with the wound care center.  I discussed with her the nature of venous disease and that any intervention is only to prevent recurrence of wounds not to treat wounds that are currently active.  She demonstrates good understanding of this we will get her back in 4 to 6 weeks and she will continue with the wound care center in the interim.     Waynetta Sandy MD [facility]

## 2021-03-11 ENCOUNTER — Encounter (HOSPITAL_BASED_OUTPATIENT_CLINIC_OR_DEPARTMENT_OTHER): Payer: Medicaid Other | Attending: Internal Medicine | Admitting: Internal Medicine

## 2021-03-11 ENCOUNTER — Other Ambulatory Visit: Payer: Self-pay | Admitting: *Deleted

## 2021-03-11 DIAGNOSIS — I110 Hypertensive heart disease with heart failure: Secondary | ICD-10-CM | POA: Insufficient documentation

## 2021-03-11 DIAGNOSIS — E11621 Type 2 diabetes mellitus with foot ulcer: Secondary | ICD-10-CM | POA: Insufficient documentation

## 2021-03-11 DIAGNOSIS — I5032 Chronic diastolic (congestive) heart failure: Secondary | ICD-10-CM | POA: Diagnosis not present

## 2021-03-11 DIAGNOSIS — S81801A Unspecified open wound, right lower leg, initial encounter: Secondary | ICD-10-CM

## 2021-03-11 DIAGNOSIS — L932 Other local lupus erythematosus: Secondary | ICD-10-CM | POA: Diagnosis not present

## 2021-03-11 DIAGNOSIS — L97529 Non-pressure chronic ulcer of other part of left foot with unspecified severity: Secondary | ICD-10-CM | POA: Insufficient documentation

## 2021-03-11 DIAGNOSIS — I739 Peripheral vascular disease, unspecified: Secondary | ICD-10-CM | POA: Insufficient documentation

## 2021-03-11 DIAGNOSIS — E039 Hypothyroidism, unspecified: Secondary | ICD-10-CM | POA: Insufficient documentation

## 2021-03-11 DIAGNOSIS — L97519 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: Secondary | ICD-10-CM

## 2021-03-11 DIAGNOSIS — S81802A Unspecified open wound, left lower leg, initial encounter: Secondary | ICD-10-CM

## 2021-03-11 NOTE — Progress Notes (Signed)
ABISH, GARRISON ([id number]) Visit Report for 03/11/2021 Arrival Information Details Patient Name: Date of Service: [name], [name] 03/11/2021 3:00 PM Medical Record Number: [id number] Patient Account Number: [id number] Date of Birth/Sex: Treating RN: May 30, 1966 (55 y.o. [name], [name] Primary Care Zehra Rucci: [name] Other Clinician: Referring Aerilyn Slee: Treating Nimai Burbach/Extender: Marisa Cyphers NCE, RO Oneita Hurt Weeks in Treatment: 2 Visit Information History Since Last Visit Added or deleted any medications: No Patient Arrived: [name] Any new allergies or adverse reactions: No Arrival Time: 15:38 Had a fall or experienced change in No Accompanied By: self activities of daily living that may affect Transfer Assistance: None risk of falls: Patient Identification Verified: Yes Signs or symptoms of abuse/neglect since last visito No Secondary Verification Process Completed: Yes Hospitalized since last visit: No Patient Requires Transmission-Based No Implantable device outside of the clinic excluding No Precautions: cellular tissue based products placed in the center Patient Has Alerts: Yes since last visit: Patient Alerts: R ABI: 1.0 L ABI: 1.1 Has Dressing in Place as Prescribed: Yes NO BENZOCAINE Pain Present Now: No USE LIDOCAINE GEL ONLY Electronic Signature(s) Signed: 03/11/2021 6:08:32 PM By: [name] RN Entered By: [name] on 03/11/2021 15:53:06 -------------------------------------------------------------------------------- Encounter Discharge Information Details Patient Name: Date of Service: [name], [name] 03/11/2021 3:00 PM Medical Record Number: [id number] Patient Account Number: [id number] Date of Birth/Sex: Treating RN: Mar 11, 1966 (55 y.o. [name], [name] Primary Care Haydan Wedig: [name] Other Clinician: Referring Zerline Melchior: Treating Infinity Jeffords/Extender: Marisa Cyphers NCE, RO Oneita Hurt Weeks in Treatment: 2 Encounter Discharge Information Items Post Procedure Vitals Discharge Condition: Stable Temperature (F): 98.7 Ambulatory Status: Walker Pulse (bpm): 74 Discharge Destination: Home Respiratory Rate (breaths/min): 17 Transportation: Private Auto Blood Pressure (mmHg): 147/74 Accompanied By: self Schedule Follow-up Appointment: Yes Clinical Summary of Care: Patient Declined Electronic Signature(s) Signed: 03/11/2021 6:08:32 PM By: [name] RN Entered By: [name] on 03/11/2021 16:35:06 -------------------------------------------------------------------------------- Lower Extremity Assessment Details Patient Name: Date of Service: [name], [name] 03/11/2021 3:00 PM Medical Record Number: [id number] Patient Account Number: [id number] Date of Birth/Sex: Treating RN: 09-17-1965 (55 y.o. [name], [name] Primary Care Eilah Common: [name] Other Clinician: Referring Ellise Kovack: Treating Sidi Dzikowski/Extender: Marisa Cyphers NCE, RO Oneita Hurt Weeks in Treatment: 2 Edema Assessment Assessed: [Left: No] [Right: No] Edema: [Left: Yes] [Right: Yes] Calf Left: Right: Point of Measurement: 29 cm From Medial Instep 37 cm 37.5 cm Ankle Left: Right: Point of Measurement: 10 cm From Medial Instep 23.7 cm 24 cm Vascular Assessment Pulses: Dorsalis Pedis Palpable: [Left:Yes] [Right:Yes] Posterior Tibial Palpable: [Left:Yes] [Right:Yes] Electronic Signature(s) Signed: 03/11/2021 6:08:32 PM By: [name] RN Entered By: [name] on 03/11/2021 15:53:31 -------------------------------------------------------------------------------- Multi Wound Chart Details Patient Name: Date of Service: [name], [name] 03/11/2021 3:00 PM Medical Record Number: [id number] Patient Account Number: [id number] Date of Birth/Sex: Treating RN: March 28, 1966 (55 y.o. [name], [name] Primary Care Toivo Bordon: [name] Other  Clinician: Referring Lashawnda Hancox: Treating Mikkel Charrette/Extender: Marisa Cyphers NCE, RO Oneita Hurt Weeks in Treatment: 2 Vital Signs Height(in): 63 Pulse(bpm): 4 Weight(lbs): 157 Blood Pressure(mmHg): 113/74 Body Mass Index(BMI): 28 Temperature(F): 98.7 Respiratory Rate(breaths/min): 17 Photos: [1:No Photos Right, Medial Ankle] [2:No Photos Right, Dorsal Foot] [3:No Photos Left, Medial Ankle] Wound Location: [1:Gradually Appeared] [2:Gradually Appeared] [3:Gradually Appeared] Wounding Event: [1:Diabetic Wound/Ulcer of the Lower] [2:Diabetic Wound/Ulcer of the Lower] [3:Diabetic Wound/Ulcer of the Lower] Primary Etiology: [  1:Extremity Anemia, Arrhythmia, Congestive Heart Anemia, Arrhythmia, Congestive Heart Anemia, Arrhythmia, Congestive Heart] [2:Extremity] [3:Extremity] Comorbid History: [1:Failure, Hypertension, Type II Diabetes, Lupus Erythematosus 04/03/2020] [2:Failure, Hypertension, Type II Diabetes, Lupus Erythematosus 04/03/2020] [3:Failure, Hypertension, Type II Diabetes, Lupus Erythematosus 02/22/2021] Date Acquired: [1:2] [2:2] [3:2] Weeks of Treatment: [1:Open] [2:Open] [3:Open] Wound Status: [1:4x5x0.1] [2:4x5x0.1] [3:0.5x0.5x0.1] Measurements L x W x D (cm) [1:15.708] [2:15.708] [3:0.196] A (cm) : rea [1:1.571] [2:1.571] [3:0.02] Volume (cm) : [1:4.80%] [2:-33.30%] [3:90.00%] % Reduction in A rea: [1:52.40%] [2:-33.40%] [3:94.90%] % Reduction in Volume: [1:Grade 2] [2:Grade 2] [3:Grade 2] Classification: [1:Medium] [2:Medium] [3:Small] Exudate A mount: [1:Serosanguineous] [2:Serosanguineous] [3:Serosanguineous] Exudate Type: [1:red, brown] [2:red, brown] [3:red, brown] Exudate Color: [1:Flat and Intact] [2:Flat and Intact] [3:Flat and Intact] Wound Margin: [1:Small (1-33%)] [2:Medium (34-66%)] [3:None Present (0%)] Granulation A mount: [1:Red] [2:Red] [3:N/A] Granulation Quality: [1:Large (67-100%)] [2:Medium (34-66%)] [3:None Present (0%)] Necrotic A mount:  [1:Eschar, Adherent Slough] [2:Adherent Slough] [3:N/A] Necrotic Tissue: [1:Fat Layer (Subcutaneous Tissue): Yes Fat Layer (Subcutaneous Tissue): Yes Fat Layer (Subcutaneous Tissue): Yes] Exposed Structures: [1:Fascia: No Tendon: No Muscle: No Joint: No Bone: No None] [2:Fascia: No Tendon: No Muscle: No Joint: No Bone: No None] [3:Fascia: No Tendon: No Muscle: No Joint: No Bone: No Large (67-100%)] Epithelialization: [1:Debridement - Excisional] [2:N/A] [3:N/A] Debridement: Pre-procedure Verification/Time Out 16:30 [2:N/A] [3:N/A] Taken: [1:Lidocaine] [2:N/A] [3:N/A] Pain Control: [1:Subcutaneous, Slough] [2:N/A] [3:N/A] Tissue Debrided: [1:Skin/Subcutaneous Tissue] [2:N/A] [3:N/A] Level: [1:20] [2:N/A] [3:N/A] Debridement A (sq cm): [1:rea Curette] [2:N/A] [3:N/A] Instrument: [1:Minimum] [2:N/A] [3:N/A] Bleeding: [1:Pressure] [2:N/A] [3:N/A] Hemostasis A chieved: [1:0] [2:N/A] [3:N/A] Procedural Pain: [1:0] [2:N/A] [3:N/A] Post Procedural Pain: [1:Procedure was tolerated well] [2:N/A] [3:N/A] Debridement Treatment Response: [1:4x5x0.1] [2:N/A] [3:N/A] Post Debridement Measurements L x W x D (cm) [1:1.571] [2:N/A] [3:N/A] Post Debridement Volume: (cm) [1:Debridement] [2:N/A] [3:N/A] Wound Number: '4 5 6 '$ Photos: No Photos No Photos No Photos Left, Lateral Ankle Left, Dorsal Foot Left, Medial Foot Wound Location: Gradually Appeared Gradually Appeared Gradually Appeared Wounding Event: Diabetic Wound/Ulcer of the Lower Diabetic Wound/Ulcer of the Lower Diabetic Wound/Ulcer of the Lower Primary Etiology: Extremity Extremity Extremity Anemia, Arrhythmia, Congestive Heart Anemia, Arrhythmia, Congestive Heart Anemia, Arrhythmia, Congestive Heart Comorbid History: Failure, Hypertension, Type II Failure, Hypertension, Type II Failure, Hypertension, Type II Diabetes, Lupus Erythematosus Diabetes, Lupus Erythematosus Diabetes, Lupus Erythematosus 04/03/2020 04/03/2020 04/03/2020 Date  Acquired: '2 2 2 '$ Weeks of Treatment: Open Open Open Wound Status: 8x5x0.2 8x5x0.2 2x4x0.2 Measurements L x W x D (cm) 31.416 31.416 6.283 A (cm) : rea 6.283 6.283 1.257 Volume (cm) : 16.70% 47.70% -1901.00% % Reduction in A rea: 16.70% -4.60% -3954.80% % Reduction in Volume: Grade 2 Unable to visualize wound bed Grade 2 Classification: Medium Medium None Present Exudate A mount: Serosanguineous Serosanguineous N/A Exudate Type: red, brown red, brown N/A Exudate Color: Flat and Intact Distinct, outline attached Flat and Intact Wound Margin: Medium (34-66%) Small (1-33%) None Present (0%) Granulation A mount: Red Red N/A Granulation Quality: Medium (34-66%) Large (67-100%) Large (67-100%) Necrotic A mount: Adherent Slough Eschar Eschar Necrotic Tissue: Fat Layer (Subcutaneous Tissue): Yes Fat Layer (Subcutaneous Tissue): Yes Fat Layer (Subcutaneous Tissue): Yes Exposed Structures: Fascia: No Fascia: No Fascia: No Tendon: No Tendon: No Tendon: No Muscle: No Muscle: No Muscle: No Joint: No Joint: No Joint: No Bone: No Bone: No Bone: No None None Medium (34-66%) Epithelialization: N/A Debridement - Excisional N/A Debridement: Pre-procedure Verification/Time Out N/A 16:30 N/A Taken: N/A Lidocaine N/A Pain Control: N/A Subcutaneous, Slough N/A Tissue Debrided: N/A Skin/Subcutaneous Tissue N/A Level:  N/A 40 N/A Debridement A (sq cm): rea N/A Curette N/A Instrument: N/A Minimum N/A Bleeding: N/A Pressure N/A Hemostasis A chieved: N/A 0 N/A Procedural Pain: N/A 0 N/A Post Procedural Pain: N/A Procedure was tolerated well N/A Debridement Treatment Response: N/A 8x5x0.2 N/A Post Debridement Measurements L x W x D (cm) N/A 6.283 N/A Post Debridement Volume: (cm) N/A Debridement N/A Procedures Performed: Treatment Notes Wound #1 (Ankle) Wound Laterality: Right, Medial Cleanser Soap and Water Discharge Instruction: May shower and wash wound  with dial antibacterial soap and water prior to dressing change. Wound Cleanser Discharge Instruction: Cleanse the wound with wound cleanser prior to applying a clean dressing using gauze sponges, not tissue or cotton balls. Peri-Wound Care Topical Primary Dressing KerraCel Ag Gelling Fiber Dressing, 4x5 in (silver alginate) Discharge Instruction: Apply silver alginate to wound bed as instructed Santyl Ointment Discharge Instruction: Apply nickel thick amount to wound bed as instructed Secondary Dressing Woven Gauze Sponge, Non-Sterile 4x4 in Discharge Instruction: Apply over primary dressing as directed. ABD Pad, 8x10 Discharge Instruction: Apply over primary dressing as directed. Secured With Elastic Bandage 4 inch (ACE bandage) Discharge Instruction: Secure with ACE bandage as directed. Kerlix Roll Sterile, 4.5x3.1 (in/yd) Discharge Instruction: Secure with Kerlix as directed. Transpore Surgical Tape, 2x10 (in/yd) Discharge Instruction: Secure dressing with tape as directed. Compression Wrap Compression Stockings Add-Ons Wound #2 (Foot) Wound Laterality: Dorsal, Right Cleanser Soap and Water Discharge Instruction: May shower and wash wound with dial antibacterial soap and water prior to dressing change. Wound Cleanser Discharge Instruction: Cleanse the wound with wound cleanser prior to applying a clean dressing using gauze sponges, not tissue or cotton balls. Peri-Wound Care Topical Primary Dressing KerraCel Ag Gelling Fiber Dressing, 4x5 in (silver alginate) Discharge Instruction: Apply silver alginate to wound bed as instructed Santyl Ointment Discharge Instruction: Apply nickel thick amount to wound bed as instructed Secondary Dressing Woven Gauze Sponge, Non-Sterile 4x4 in Discharge Instruction: Apply over primary dressing as directed. ABD Pad, 8x10 Discharge Instruction: Apply over primary dressing as directed. Secured With Elastic Bandage 4 inch (ACE  bandage) Discharge Instruction: Secure with ACE bandage as directed. Kerlix Roll Sterile, 4.5x3.1 (in/yd) Discharge Instruction: Secure with Kerlix as directed. Transpore Surgical Tape, 2x10 (in/yd) Discharge Instruction: Secure dressing with tape as directed. Compression Wrap Compression Stockings Add-Ons Wound #3 (Ankle) Wound Laterality: Left, Medial Cleanser Soap and Water Discharge Instruction: May shower and wash wound with dial antibacterial soap and water prior to dressing change. Wound Cleanser Discharge Instruction: Cleanse the wound with wound cleanser prior to applying a clean dressing using gauze sponges, not tissue or cotton balls. Peri-Wound Care Topical Primary Dressing KerraCel Ag Gelling Fiber Dressing, 4x5 in (silver alginate) Discharge Instruction: Apply silver alginate to wound bed as instructed Santyl Ointment Discharge Instruction: Apply nickel thick amount to wound bed as instructed Secondary Dressing Woven Gauze Sponge, Non-Sterile 4x4 in Discharge Instruction: Apply over primary dressing as directed. ABD Pad, 8x10 Discharge Instruction: Apply over primary dressing as directed. Secured With Elastic Bandage 4 inch (ACE bandage) Discharge Instruction: Secure with ACE bandage as directed. Kerlix Roll Sterile, 4.5x3.1 (in/yd) Discharge Instruction: Secure with Kerlix as directed. Transpore Surgical Tape, 2x10 (in/yd) Discharge Instruction: Secure dressing with tape as directed. Compression Wrap Compression Stockings Add-Ons Wound #4 (Ankle) Wound Laterality: Left, Lateral Cleanser Soap and Water Discharge Instruction: May shower and wash wound with dial antibacterial soap and water prior to dressing change. Wound Cleanser Discharge Instruction: Cleanse the wound with wound cleanser prior to applying a clean dressing  using gauze sponges, not tissue or cotton balls. Peri-Wound Care Topical Primary Dressing KerraCel Ag Gelling Fiber Dressing, 4x5 in  (silver alginate) Discharge Instruction: Apply silver alginate to wound bed as instructed Santyl Ointment Discharge Instruction: Apply nickel thick amount to wound bed as instructed Secondary Dressing Woven Gauze Sponge, Non-Sterile 4x4 in Discharge Instruction: Apply over primary dressing as directed. ABD Pad, 8x10 Discharge Instruction: Apply over primary dressing as directed. Secured With Elastic Bandage 4 inch (ACE bandage) Discharge Instruction: Secure with ACE bandage as directed. Kerlix Roll Sterile, 4.5x3.1 (in/yd) Discharge Instruction: Secure with Kerlix as directed. Transpore Surgical Tape, 2x10 (in/yd) Discharge Instruction: Secure dressing with tape as directed. Compression Wrap Compression Stockings Add-Ons Wound #5 (Foot) Wound Laterality: Dorsal, Left Cleanser Soap and Water Discharge Instruction: May shower and wash wound with dial antibacterial soap and water prior to dressing change. Wound Cleanser Discharge Instruction: Cleanse the wound with wound cleanser prior to applying a clean dressing using gauze sponges, not tissue or cotton balls. Peri-Wound Care Topical Primary Dressing KerraCel Ag Gelling Fiber Dressing, 4x5 in (silver alginate) Discharge Instruction: Apply silver alginate to wound bed as instructed Santyl Ointment Discharge Instruction: Apply nickel thick amount to wound bed as instructed Secondary Dressing Woven Gauze Sponge, Non-Sterile 4x4 in Discharge Instruction: Apply over primary dressing as directed. ABD Pad, 8x10 Discharge Instruction: Apply over primary dressing as directed. Secured With Elastic Bandage 4 inch (ACE bandage) Discharge Instruction: Secure with ACE bandage as directed. Kerlix Roll Sterile, 4.5x3.1 (in/yd) Discharge Instruction: Secure with Kerlix as directed. Transpore Surgical Tape, 2x10 (in/yd) Discharge Instruction: Secure dressing with tape as directed. Compression Wrap Compression Stockings Add-Ons Wound #6  (Foot) Wound Laterality: Left, Medial Cleanser Soap and Water Discharge Instruction: May shower and wash wound with dial antibacterial soap and water prior to dressing change. Wound Cleanser Discharge Instruction: Cleanse the wound with wound cleanser prior to applying a clean dressing using gauze sponges, not tissue or cotton balls. Peri-Wound Care Topical Primary Dressing KerraCel Ag Gelling Fiber Dressing, 4x5 in (silver alginate) Discharge Instruction: Apply silver alginate to wound bed as instructed Santyl Ointment Discharge Instruction: Apply nickel thick amount to wound bed as instructed Secondary Dressing Woven Gauze Sponge, Non-Sterile 4x4 in Discharge Instruction: Apply over primary dressing as directed. ABD Pad, 8x10 Discharge Instruction: Apply over primary dressing as directed. Secured With Elastic Bandage 4 inch (ACE bandage) Discharge Instruction: Secure with ACE bandage as directed. Kerlix Roll Sterile, 4.5x3.1 (in/yd) Discharge Instruction: Secure with Kerlix as directed. Transpore Surgical Tape, 2x10 (in/yd) Discharge Instruction: Secure dressing with tape as directed. Compression Wrap Compression Stockings Add-Ons Electronic Signature(s) Signed: 03/11/2021 5:27:23 PM By: Kalman Shan DO Signed: 03/11/2021 6:08:32 PM By: [name] RN Entered By: Kalman Shan on 03/11/2021 17:20:28 -------------------------------------------------------------------------------- Multi-Disciplinary Care Plan Details Patient Name: Date of Service: [name], [name] 03/11/2021 3:00 PM Medical Record Number: [id number] Patient Account Number: [id number] Date of Birth/Sex: Treating RN: August 24, 1965 (55 y.o. [name], [name] Primary Care Jaliza Seifried: [name] Other Clinician: Referring Tanara Turvey: Treating Evett Kassa/Extender: Marisa Cyphers NCE, RO Oneita Hurt Weeks in Treatment: 2 Active Inactive Tissue Oxygenation Nursing Diagnoses: Actual  ineffective tissue perfusion; peripheral (select once diagnosis is confirmed) Goals: Patient/caregiver will verbalize understanding of disease process and disease management Date Initiated: 02/22/2021 Target Resolution Date: 03/29/2021 Goal Status: Active Interventions: Assess patient understanding of disease process and management upon diagnosis and as needed Assess peripheral arterial status upon admission and as needed Provide education on tissue oxygenation and ischemia  Treatment Activities: T ordered outside of clinic : 02/22/2021 est Notes: Wound/Skin Impairment Nursing Diagnoses: Impaired tissue integrity Goals: Patient/caregiver will verbalize understanding of skin care regimen Date Initiated: 02/22/2021 Target Resolution Date: 03/22/2021 Goal Status: Active Ulcer/skin breakdown will have a volume reduction of 30% by week 4 Date Initiated: 02/22/2021 Target Resolution Date: 03/22/2021 Goal Status: Active Interventions: Assess patient/caregiver ability to obtain necessary supplies Assess patient/caregiver ability to perform ulcer/skin care regimen upon admission and as needed Assess ulceration(s) every visit Provide education on ulcer and skin care Treatment Activities: Topical wound management initiated : 02/22/2021 Notes: Electronic Signature(s) Signed: 03/11/2021 6:08:32 PM By: [name] RN Entered By: [name] on 03/11/2021 16:28:22 -------------------------------------------------------------------------------- Pain Assessment Details Patient Name: Date of Service: [name], [name] 03/11/2021 3:00 PM Medical Record Number: [id number] Patient Account Number: [id number] Date of Birth/Sex: Treating RN: 1965-07-24 (55 y.o. [name], [name] Primary Care Kaijah Abts: [name] Other Clinician: Referring Felica Chargois: Treating Bram Hottel/Extender: Marisa Cyphers NCE, RO Oneita Hurt Weeks in Treatment: 2 Active Problems Location of Pain  Severity and Description of Pain Patient Has Paino Yes Site Locations Pain Location: Generalized Pain, Pain in Ulcers With Dressing Change: Yes Duration of the Pain. Constant / Intermittento Intermittent Rate the pain. Current Pain Level: 6 Worst Pain Level: 10 Least Pain Level: 0 Tolerable Pain Level: 6 Character of Pain Describe the Pain: Aching Pain Management and Medication Current Pain Management: Medication: No Cold Application: No Rest: No Massage: No Activity: No T.E.N.S.: No Heat Application: No Leg drop or elevation: No Is the Current Pain Management Adequate: Adequate How does your wound impact your activities of daily livingo Sleep: No Bathing: No Appetite: No Relationship With Others: No Bladder Continence: No Emotions: No Bowel Continence: No Work: No Toileting: No Drive: No Dressing: No Hobbies: No Electronic Signature(s) Signed: 03/11/2021 6:08:32 PM By: [name] RN Entered By: [name] on 03/11/2021 15:55:12 -------------------------------------------------------------------------------- Patient/Caregiver Education Details Patient Name: Date of Service: BRO A DNA Rhunette Croft, [name] 9/8/2022andnbsp3:00 PM Medical Record Number: [id number] Patient Account Number: [id number] Date of Birth/Gender: Treating RN: March 04, 1966 (55 y.o. [name], [name] Primary Care Physician: [name] Other Clinician: Referring Physician: Treating Physician/Extender: Marisa Cyphers NCE, RO Bryson Ha in Treatment: 2 Education Assessment Education Provided To: Patient Education Topics Provided Tissue Oxygenation: Methods: Explain/Verbal Wound/Skin Impairment: Methods: Explain/Verbal Responses: State content correctly Electronic Signature(s) Signed: 03/11/2021 6:08:32 PM By: [name] RN Entered By: [name] on 03/11/2021  16:28:41 -------------------------------------------------------------------------------- Wound Assessment Details Patient Name: Date of Service: [name], [name] 03/11/2021 3:00 PM Medical Record Number: [id number] Patient Account Number: [id number] Date of Birth/Sex: Treating RN: 1965-10-05 (55 y.o. [name], [name] Primary Care Cyndi Montejano: [name] Other Clinician: Referring Sherline Eberwein: Treating Witten Certain/Extender: Marisa Cyphers NCE, RO Oneita Hurt Weeks in Treatment: 2 Wound Status Wound Number: 1 Primary Diabetic Wound/Ulcer of the Lower Extremity Etiology: Wound Location: Right, Medial Ankle Wound Open Wounding Event: Gradually Appeared Status: Date Acquired: 04/03/2020 Comorbid Anemia, Arrhythmia, Congestive Heart Failure, Hypertension, Weeks Of Treatment: 2 History: Type II Diabetes, Lupus Erythematosus Clustered Wound: No Wound Measurements Length: (cm) 4 Width: (cm) 5 Depth: (cm) 0.1 Area: (cm) 15.708 Volume: (cm) 1.571 Wound Description Classification: Grade 2 Wound Margin: Flat and Intact Exudate Amount: Medium Exudate Type: Serosanguineous Exudate Color: red, brown Foul Odor After Cleansing: Slough/Fibrino % Reduction in Area: 4.8% % Reduction in Volume: 52.4% Epithelialization: None Tunneling: No Undermining: No No  Yes Wound Bed Granulation Amount: Small (1-33%) Exposed Structure Granulation Quality: Red Fascia Exposed: No Necrotic Amount: Large (67-100%) Fat Layer (Subcutaneous Tissue) Exposed: Yes Necrotic Quality: Eschar, Adherent Slough Tendon Exposed: No Muscle Exposed: No Joint Exposed: No Bone Exposed: No Treatment Notes Wound #1 (Ankle) Wound Laterality: Right, Medial Cleanser Soap and Water Discharge Instruction: May shower and wash wound with dial antibacterial soap and water prior to dressing change. Wound Cleanser Discharge Instruction: Cleanse the wound with wound cleanser prior to applying a clean  dressing using gauze sponges, not tissue or cotton balls. Peri-Wound Care Topical Primary Dressing KerraCel Ag Gelling Fiber Dressing, 4x5 in (silver alginate) Discharge Instruction: Apply silver alginate to wound bed as instructed Santyl Ointment Discharge Instruction: Apply nickel thick amount to wound bed as instructed Secondary Dressing Woven Gauze Sponge, Non-Sterile 4x4 in Discharge Instruction: Apply over primary dressing as directed. ABD Pad, 8x10 Discharge Instruction: Apply over primary dressing as directed. Secured With Elastic Bandage 4 inch (ACE bandage) Discharge Instruction: Secure with ACE bandage as directed. Kerlix Roll Sterile, 4.5x3.1 (in/yd) Discharge Instruction: Secure with Kerlix as directed. Transpore Surgical Tape, 2x10 (in/yd) Discharge Instruction: Secure dressing with tape as directed. Compression Wrap Compression Stockings Add-Ons Electronic Signature(s) Signed: 03/11/2021 6:08:32 PM By: [name] RN Entered By: [name] on 03/11/2021 16:19:46 -------------------------------------------------------------------------------- Wound Assessment Details Patient Name: Date of Service: [name], [name] 03/11/2021 3:00 PM Medical Record Number: [id number] Patient Account Number: [id number] Date of Birth/Sex: Treating RN: 02-Dec-1965 (55 y.o. [name], [name] Primary Care Brynn Mulgrew: Other Clinician: [name] Referring Vontrell Pullman: Treating Kamrynn Melott/Extender: Marisa Cyphers NCE, RO Oneita Hurt Weeks in Treatment: 2 Wound Status Wound Number: 2 Primary Diabetic Wound/Ulcer of the Lower Extremity Etiology: Wound Location: Right, Dorsal Foot Wound Open Wounding Event: Gradually Appeared Status: Date Acquired: 04/03/2020 Comorbid Anemia, Arrhythmia, Congestive Heart Failure, Hypertension, Weeks Of Treatment: 2 History: Type II Diabetes, Lupus Erythematosus Clustered Wound: No Wound Measurements Length: (cm)  4 Width: (cm) 5 Depth: (cm) 0.1 Area: (cm) 15.708 Volume: (cm) 1.571 % Reduction in Area: -33.3% % Reduction in Volume: -33.4% Epithelialization: None Tunneling: No Undermining: No Wound Description Classification: Grade 2 Wound Margin: Flat and Intact Exudate Amount: Medium Exudate Type: Serosanguineous Exudate Color: red, brown Foul Odor After Cleansing: No Slough/Fibrino Yes Wound Bed Granulation Amount: Medium (34-66%) Exposed Structure Granulation Quality: Red Fascia Exposed: No Necrotic Amount: Medium (34-66%) Fat Layer (Subcutaneous Tissue) Exposed: Yes Necrotic Quality: Adherent Slough Tendon Exposed: No Muscle Exposed: No Joint Exposed: No Bone Exposed: No Treatment Notes Wound #2 (Foot) Wound Laterality: Dorsal, Right Cleanser Soap and Water Discharge Instruction: May shower and wash wound with dial antibacterial soap and water prior to dressing change. Wound Cleanser Discharge Instruction: Cleanse the wound with wound cleanser prior to applying a clean dressing using gauze sponges, not tissue or cotton balls. Peri-Wound Care Topical Primary Dressing KerraCel Ag Gelling Fiber Dressing, 4x5 in (silver alginate) Discharge Instruction: Apply silver alginate to wound bed as instructed Santyl Ointment Discharge Instruction: Apply nickel thick amount to wound bed as instructed Secondary Dressing Woven Gauze Sponge, Non-Sterile 4x4 in Discharge Instruction: Apply over primary dressing as directed. ABD Pad, 8x10 Discharge Instruction: Apply over primary dressing as directed. Secured With Elastic Bandage 4 inch (ACE bandage) Discharge Instruction: Secure with ACE bandage as directed. Kerlix Roll Sterile, 4.5x3.1 (in/yd) Discharge Instruction: Secure with Kerlix as directed. Transpore Surgical Tape, 2x10 (in/yd) Discharge Instruction: Secure dressing with tape as directed. Compression Wrap Compression Stockings Add-Ons Electronic  Signature(s) Signed:  03/11/2021 6:08:32 PM By: [name] RN Entered By: [name] on 03/11/2021 16:19:15 -------------------------------------------------------------------------------- Wound Assessment Details Patient Name: Date of Service: [name], [name] 03/11/2021 3:00 PM Medical Record Number: [id number] Patient Account Number: [id number] Date of Birth/Sex: Treating RN: [birth year] (55 y.o. [name], [name] Primary Care Deyani Hegarty: [name] Other Clinician: Referring Alvine Mostafa: Treating Mandela Bello/Extender: Marisa Cyphers NCE, RO Oneita Hurt Weeks in Treatment: 2 Wound Status Wound Number: 3 Primary Diabetic Wound/Ulcer of the Lower Extremity Etiology: Wound Location: Left, Medial Ankle Wound Open Wounding Event: Gradually Appeared Status: Date Acquired: 02/22/2021 Comorbid Anemia, Arrhythmia, Congestive Heart Failure, Hypertension, Weeks Of Treatment: 2 History: Type II Diabetes, Lupus Erythematosus Clustered Wound: No Wound Measurements Length: (cm) 0.5 Width: (cm) 0.5 Depth: (cm) 0.1 Area: (cm) 0.196 Volume: (cm) 0.02 % Reduction in Area: 90% % Reduction in Volume: 94.9% Epithelialization: Large (67-100%) Tunneling: No Undermining: No Wound Description Classification: Grade 2 Wound Margin: Flat and Intact Exudate Amount: Small Exudate Type: Serosanguineous Exudate Color: red, brown Foul Odor After Cleansing: No Slough/Fibrino Yes Wound Bed Granulation Amount: None Present (0%) Exposed Structure Necrotic Amount: None Present (0%) Fascia Exposed: No Fat Layer (Subcutaneous Tissue) Exposed: Yes Tendon Exposed: No Muscle Exposed: No Joint Exposed: No Bone Exposed: No Treatment Notes Wound #3 (Ankle) Wound Laterality: Left, Medial Cleanser Soap and Water Discharge Instruction: May shower and wash wound with dial antibacterial soap and water prior to dressing change. Wound Cleanser Discharge Instruction: Cleanse the wound with wound  cleanser prior to applying a clean dressing using gauze sponges, not tissue or cotton balls. Peri-Wound Care Topical Primary Dressing KerraCel Ag Gelling Fiber Dressing, 4x5 in (silver alginate) Discharge Instruction: Apply silver alginate to wound bed as instructed Santyl Ointment Discharge Instruction: Apply nickel thick amount to wound bed as instructed Secondary Dressing Woven Gauze Sponge, Non-Sterile 4x4 in Discharge Instruction: Apply over primary dressing as directed. ABD Pad, 8x10 Discharge Instruction: Apply over primary dressing as directed. Secured With Elastic Bandage 4 inch (ACE bandage) Discharge Instruction: Secure with ACE bandage as directed. Kerlix Roll Sterile, 4.5x3.1 (in/yd) Discharge Instruction: Secure with Kerlix as directed. Transpore Surgical Tape, 2x10 (in/yd) Discharge Instruction: Secure dressing with tape as directed. Compression Wrap Compression Stockings Add-Ons Electronic Signature(s) Signed: 03/11/2021 6:08:32 PM By: [name] RN Entered By: [name] on 03/11/2021 16:18:55 -------------------------------------------------------------------------------- Wound Assessment Details Patient Name: Date of Service: [name], [name] 03/11/2021 3:00 PM Medical Record Number: [id number] Patient Account Number: [id number] Date of Birth/Sex: Treating RN: January 20, 1966 (55 y.o. [name], [name] Primary Care Jacquel Redditt: [name] Other Clinician: Referring Pawan Knechtel: Treating Brynli Ollis/Extender: Marisa Cyphers NCE, RO Oneita Hurt Weeks in Treatment: 2 Wound Status Wound Number: 4 Primary Diabetic Wound/Ulcer of the Lower Extremity Etiology: Wound Location: Left, Lateral Ankle Wound Open Wounding Event: Gradually Appeared Status: Date Acquired: 04/03/2020 Comorbid Anemia, Arrhythmia, Congestive Heart Failure, Hypertension, Weeks Of Treatment: 2 History: Type II Diabetes, Lupus Erythematosus Clustered Wound: No Wound  Measurements Length: (cm) 8 Width: (cm) 5 Depth: (cm) 0.2 Area: (cm) 31.416 Volume: (cm) 6.283 % Reduction in Area: 16.7% % Reduction in Volume: 16.7% Epithelialization: None Tunneling: No Undermining: No Wound Description Classification: Grade 2 Wound Margin: Flat and Intact Exudate Amount: Medium Exudate Type: Serosanguineous Exudate Color: red, brown Foul Odor After Cleansing: No Slough/Fibrino Yes Wound Bed Granulation Amount: Medium (34-66%) Exposed Structure Granulation Quality: Red Fascia Exposed: No Necrotic Amount: Medium (34-66%) Fat Layer (Subcutaneous Tissue) Exposed: Yes  Necrotic Quality: Adherent Slough Tendon Exposed: No Muscle Exposed: No Joint Exposed: No Bone Exposed: No Treatment Notes Wound #4 (Ankle) Wound Laterality: Left, Lateral Cleanser Soap and Water Discharge Instruction: May shower and wash wound with dial antibacterial soap and water prior to dressing change. Wound Cleanser Discharge Instruction: Cleanse the wound with wound cleanser prior to applying a clean dressing using gauze sponges, not tissue or cotton balls. Peri-Wound Care Topical Primary Dressing KerraCel Ag Gelling Fiber Dressing, 4x5 in (silver alginate) Discharge Instruction: Apply silver alginate to wound bed as instructed Santyl Ointment Discharge Instruction: Apply nickel thick amount to wound bed as instructed Secondary Dressing Woven Gauze Sponge, Non-Sterile 4x4 in Discharge Instruction: Apply over primary dressing as directed. ABD Pad, 8x10 Discharge Instruction: Apply over primary dressing as directed. Secured With Elastic Bandage 4 inch (ACE bandage) Discharge Instruction: Secure with ACE bandage as directed. Kerlix Roll Sterile, 4.5x3.1 (in/yd) Discharge Instruction: Secure with Kerlix as directed. Transpore Surgical Tape, 2x10 (in/yd) Discharge Instruction: Secure dressing with tape as directed. Compression Wrap Compression Stockings Add-Ons Electronic  Signature(s) Signed: 03/11/2021 6:08:32 PM By: [name] RN Entered By: [name] on 03/11/2021 16:17:56 -------------------------------------------------------------------------------- Wound Assessment Details Patient Name: Date of Service: [name], [name] 03/11/2021 3:00 PM Medical Record Number: [id number] Patient Account Number: [id number] Date of Birth/Sex: Treating RN: 1966-01-08 (55 y.o. [name], [name] Primary Care Givanni Staron: [name] Other Clinician: Referring Lylith Bebeau: Treating Masaji Billups/Extender: Marisa Cyphers NCE, RO Oneita Hurt Weeks in Treatment: 2 Wound Status Wound Number: 5 Primary Diabetic Wound/Ulcer of the Lower Extremity Etiology: Wound Location: Left, Dorsal Foot Wound Open Wounding Event: Gradually Appeared Status: Date Acquired: 04/03/2020 Comorbid Anemia, Arrhythmia, Congestive Heart Failure, Hypertension, Weeks Of Treatment: 2 History: Type II Diabetes, Lupus Erythematosus Clustered Wound: No Wound Measurements Length: (cm) 8 Width: (cm) 5 Depth: (cm) 0.2 Area: (cm) 31.416 Volume: (cm) 6.283 % Reduction in Area: 47.7% % Reduction in Volume: -4.6% Epithelialization: None Tunneling: No Undermining: No Wound Description Classification: Unable to visualize wound bed Wound Margin: Distinct, outline attached Exudate Amount: Medium Exudate Type: Serosanguineous Exudate Color: red, brown Foul Odor After Cleansing: No Slough/Fibrino Yes Wound Bed Granulation Amount: Small (1-33%) Exposed Structure Granulation Quality: Red Fascia Exposed: No Necrotic Amount: Large (67-100%) Fat Layer (Subcutaneous Tissue) Exposed: Yes Necrotic Quality: Eschar Tendon Exposed: No Muscle Exposed: No Joint Exposed: No Bone Exposed: No Treatment Notes Wound #5 (Foot) Wound Laterality: Dorsal, Left Cleanser Soap and Water Discharge Instruction: May shower and wash wound with dial antibacterial soap and water prior to  dressing change. Wound Cleanser Discharge Instruction: Cleanse the wound with wound cleanser prior to applying a clean dressing using gauze sponges, not tissue or cotton balls. Peri-Wound Care Topical Primary Dressing KerraCel Ag Gelling Fiber Dressing, 4x5 in (silver alginate) Discharge Instruction: Apply silver alginate to wound bed as instructed Santyl Ointment Discharge Instruction: Apply nickel thick amount to wound bed as instructed Secondary Dressing Woven Gauze Sponge, Non-Sterile 4x4 in Discharge Instruction: Apply over primary dressing as directed. ABD Pad, 8x10 Discharge Instruction: Apply over primary dressing as directed. Secured With Elastic Bandage 4 inch (ACE bandage) Discharge Instruction: Secure with ACE bandage as directed. Kerlix Roll Sterile, 4.5x3.1 (in/yd) Discharge Instruction: Secure with Kerlix as directed. Transpore Surgical Tape, 2x10 (in/yd) Discharge Instruction: Secure dressing with tape as directed. Compression Wrap Compression Stockings Add-Ons Electronic Signature(s) Signed: 03/11/2021 6:08:32 PM By: [name] RN Entered By: [name] on 03/11/2021 16:32:15 -------------------------------------------------------------------------------- Wound Assessment Details Patient Name: Date of  Service: [name], [name] 03/11/2021 3:00 PM Medical Record Number: [id number] Patient Account Number: [id number] Date of Birth/Sex: Treating RN: 01/09/1966 (55 y.o. [name], [name] Primary Care Lilja Soland: [name] Other Clinician: Referring Wrenna Saks: Treating Deveion Denz/Extender: Marisa Cyphers NCE, RO Oneita Hurt Weeks in Treatment: 2 Wound Status Wound Number: 6 Primary Diabetic Wound/Ulcer of the Lower Extremity Etiology: Wound Location: Left, Medial Foot Wound Open Wounding Event: Gradually Appeared Status: Date Acquired: 04/03/2020 Comorbid Anemia, Arrhythmia, Congestive Heart Failure, Hypertension, Weeks Of  Treatment: 2 History: Type II Diabetes, Lupus Erythematosus Clustered Wound: No Wound Measurements Length: (cm) 2 Width: (cm) 4 Depth: (cm) 0.2 Area: (cm) 6.283 Volume: (cm) 1.257 % Reduction in Area: -1901% % Reduction in Volume: -3954.8% Epithelialization: Medium (34-66%) Tunneling: No Undermining: No Wound Description Classification: Grade 2 Wound Margin: Flat and Intact Exudate Amount: None Present Foul Odor After Cleansing: No Slough/Fibrino No Wound Bed Granulation Amount: None Present (0%) Exposed Structure Necrotic Amount: Large (67-100%) Fascia Exposed: No Necrotic Quality: Eschar Fat Layer (Subcutaneous Tissue) Exposed: Yes Tendon Exposed: No Muscle Exposed: No Joint Exposed: No Bone Exposed: No Treatment Notes Wound #6 (Foot) Wound Laterality: Left, Medial Cleanser Soap and Water Discharge Instruction: May shower and wash wound with dial antibacterial soap and water prior to dressing change. Wound Cleanser Discharge Instruction: Cleanse the wound with wound cleanser prior to applying a clean dressing using gauze sponges, not tissue or cotton balls. Peri-Wound Care Topical Primary Dressing KerraCel Ag Gelling Fiber Dressing, 4x5 in (silver alginate) Discharge Instruction: Apply silver alginate to wound bed as instructed Santyl Ointment Discharge Instruction: Apply nickel thick amount to wound bed as instructed Secondary Dressing Woven Gauze Sponge, Non-Sterile 4x4 in Discharge Instruction: Apply over primary dressing as directed. ABD Pad, 8x10 Discharge Instruction: Apply over primary dressing as directed. Secured With Elastic Bandage 4 inch (ACE bandage) Discharge Instruction: Secure with ACE bandage as directed. Kerlix Roll Sterile, 4.5x3.1 (in/yd) Discharge Instruction: Secure with Kerlix as directed. Transpore Surgical Tape, 2x10 (in/yd) Discharge Instruction: Secure dressing with tape as directed. Compression Wrap Compression  Stockings Add-Ons Electronic Signature(s) Signed: 03/11/2021 6:08:32 PM By: [name] RN Entered By: [name] on 03/11/2021 16:17:19 -------------------------------------------------------------------------------- Vitals Details Patient Name: Date of Service: [name], [name] 03/11/2021 3:00 PM Medical Record Number: [id number] Patient Account Number: [id number] Date of Birth/Sex: Treating RN: 26-May-1966 (55 y.o. [name], [name] Primary Care Kirah Stice: [name] Other Clinician: Referring Deshanti Adcox: Treating Shacara Cozine/Extender: Marisa Cyphers NCE, RO Oneita Hurt Weeks in Treatment: 2 Vital Signs Time Taken: 15:40 Temperature (F): 98.7 Height (in): 63 Pulse (bpm): 74 Weight (lbs): 157 Respiratory Rate (breaths/min): 17 Body Mass Index (BMI): 27.8 Blood Pressure (mmHg): 113/74 Reference Range: 80 - 120 mg / dl Electronic Signature(s) Signed: 03/11/2021 6:08:32 PM By: [name] RN Entered By: [name] on 03/11/2021 15:52:29

## 2021-03-11 NOTE — Progress Notes (Signed)
[name], [name] ([id number]) Visit Report for 03/11/2021 Chief Complaint Document Details Patient Name: Date of Service: [name], [name] 03/11/2021 3:00 PM Medical Record Number: [id number] Patient Account Number: [id number] Date of Birth/Sex: Treating RN: March 29, 1966 (55 y.o. [name], [name] Primary Care Provider: [name] Other Clinician: Referring Provider: Treating Provider/Extender: Marisa Cyphers NCE, RO Oneita Hurt Weeks in Treatment: 2 Information Obtained from: Patient Chief Complaint Bilateral feet wounds Electronic Signature(s) Signed: 03/11/2021 5:27:23 PM By: Kalman Shan DO Entered By: Kalman Shan on 03/11/2021 17:20:36 -------------------------------------------------------------------------------- Debridement Details Patient Name: Date of Service: [name], [name] 03/11/2021 3:00 PM Medical Record Number: [id number] Patient Account Number: [id number] Date of Birth/Sex: Treating RN: August 08, 1965 (55 y.o. [name], [name] Primary Care Provider: [name] Other Clinician: Referring Provider: Treating Provider/Extender: Marisa Cyphers NCE, RO Oneita Hurt Weeks in Treatment: 2 Debridement Performed for Assessment: Wound #1 Right,Medial Ankle Performed By: Physician Kalman Shan, DO Debridement Type: Debridement Severity of Tissue Pre Debridement: Fat layer exposed Level of Consciousness (Pre-procedure): Awake and Alert Pre-procedure Verification/Time Out Yes - 16:30 Taken: Start Time: 16:30 Pain Control: Lidocaine T Area Debrided (L x W): otal 4 (cm) x 5 (cm) = 20 (cm) Tissue and other material debrided: Viable, Non-Viable, Slough, Subcutaneous, Skin: Dermis , Skin: Epidermis, Slough Level: Skin/Subcutaneous Tissue Debridement Description: Excisional Instrument: Curette Bleeding: Minimum Hemostasis Achieved: Pressure End Time: 16:30 Procedural Pain: 0 Post Procedural Pain: 0 Response to Treatment:  Procedure was tolerated well Level of Consciousness (Post- Awake and Alert procedure): Post Debridement Measurements of Total Wound Length: (cm) 4 Width: (cm) 5 Depth: (cm) 0.1 Volume: (cm) 1.571 Character of Wound/Ulcer Post Debridement: Improved Severity of Tissue Post Debridement: Fat layer exposed Post Procedure Diagnosis Same as Pre-procedure Electronic Signature(s) Signed: 03/11/2021 5:27:23 PM By: Kalman Shan DO Signed: 03/11/2021 6:08:32 PM By: [name] RN Entered By: [name] on 03/11/2021 16:30:24 -------------------------------------------------------------------------------- Debridement Details Patient Name: Date of Service: [name], [name] 03/11/2021 3:00 PM Medical Record Number: [id number] Patient Account Number: [id number] Date of Birth/Sex: Treating RN: 17-Mar-1966 (55 y.o. [name], [name] Primary Care Provider: [name] Other Clinician: Referring Provider: Treating Provider/Extender: Marisa Cyphers NCE, RO Oneita Hurt Weeks in Treatment: 2 Debridement Performed for Assessment: Wound #5 Left,Dorsal Foot Performed By: Physician Kalman Shan, DO Debridement Type: Debridement Severity of Tissue Pre Debridement: Fat layer exposed Level of Consciousness (Pre-procedure): Awake and Alert Pre-procedure Verification/Time Out Yes - 16:30 Taken: Start Time: 16:30 Pain Control: Lidocaine T Area Debrided (L x W): otal 8 (cm) x 5 (cm) = 40 (cm) Tissue and other material debrided: Viable, Non-Viable, Slough, Subcutaneous, Skin: Dermis , Skin: Epidermis, Slough Level: Skin/Subcutaneous Tissue Debridement Description: Excisional Instrument: Curette Bleeding: Minimum Hemostasis Achieved: Pressure End Time: 16:30 Procedural Pain: 0 Post Procedural Pain: 0 Response to Treatment: Procedure was tolerated well Level of Consciousness (Post- Awake and Alert procedure): Post Debridement Measurements of Total Wound Length:  (cm) 8 Width: (cm) 5 Depth: (cm) 0.2 Volume: (cm) 6.283 Character of Wound/Ulcer Post Debridement: Improved Severity of Tissue Post Debridement: Fat layer exposed Post Procedure Diagnosis Same as Pre-procedure Electronic Signature(s) Signed: 03/11/2021 5:27:23 PM By: Kalman Shan DO Signed: 03/11/2021 6:08:32 PM By: [name] RN Entered By: [name] on 03/11/2021 16:33:17 -------------------------------------------------------------------------------- HPI Details Patient Name: Date of Service: [name], [name] 03/11/2021 3:00 PM Medical Record Number: [id number] Patient Account Number: [id number] Date  of Birth/Sex: Treating RN: Apr 28, 1966 (55 y.o. [name], [name] Primary Care Provider: [name] Other Clinician: Referring Provider: Treating Provider/Extender: Marisa Cyphers NCE, RO Oneita Hurt Weeks in Treatment: 2 History of Present Illness HPI Description: Admission 8/22 [name] is a 55 year old female with a past medical history of diet-controlled type 2 diabetes, hypothyroidism, chronic diastolic heart failure, cutaneous lupus erythematosus and essential hypertension that presents to the clinic for a 64-monthhistory of worsening bilateral feet ulcers. She states that wounds to her feet have been an ongoing issue for several years. She states that with wound care they improve however they tend to wax and wane. 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 She was discharged to a nursing facility and she was provided wound care for her bilateral feet ulcers at that time. She states they used Silvadene and silver alginate. She states that when she was discharged in April from the nursing facility her wounds had improved but were not fully closed. She currently uses Silvadene but has not been using silver alginate for several months due to running out. She reports significant decline in her wound healing  over the last 4 months. She reports pain to the wound sites. She currently denies increased warmth erythema or purulent drainage. 8/29; patient presents for 1 week follow-up. She states she had ABIs completed without TBI's. She continues to use silver alginate with dressing changes. She reports continued pain. She denies signs of infection. 9/8; patient presents for 1 week follow-up. She was evaluated by Dr. CDonzetta Mattersand she reports follow-up with him in 1 month. She has been using Santyl to the wound beds. She reports some improvement in wound healing. She denies signs of infection. Electronic Signature(s) Signed: 03/11/2021 5:27:23 PM By: HKalman ShanDO Entered By: HKalman Shanon 03/11/2021 17:21:25 -------------------------------------------------------------------------------- Physical Exam Details Patient Name: Date of Service: [name], [name] 03/11/2021 3:00 PM Medical Record Number: [id number] Account Number: [id number] of Birth/Sex: Treating RN: [birth year](55y.o. FTonita Phoenix [name] Primary Care Provider: BLeonie DouglasOther Clinician: Referring Provider: Treating Provider/Extender: HMarisa CyphersNCE, RO COneita HurtWeeks in Treatment: 2 Constitutional respirations regular, non-labored and within target range for patient..Marland KitchenPsychiatric pleasant and cooperative. Notes Right lower extremity: T the medial ankle and dorsum of the right foot she has open wounds with necrotic debris and some granulation tissue present. Left o lower extremity: T the left medial ankle she has a small open wound with nonviable tissue and granulation tissue present. T the left lateral ankle she has a o o large open wound with granulation tissue and nonviable tissue present. T the left dorsum of the foot she has significant necrotic debris. T the medial left foot o o she has small open wounds with granulation tissue and nonviable tissue present. No obvious signs of  infection to all wound sites. Difficult to palpate pedal pulses. Improvement to the appearance since last clinic visit. Electronic Signature(s) Signed: 03/11/2021 5:27:23 PM By: HKalman ShanDO Entered By: HKalman Shanon 03/11/2021 17:22:23 -------------------------------------------------------------------------------- Physician Orders Details Patient Name: Date of Service: [name], [name] 03/11/2021 3:00 PM Medical Record Number: [id number] Account Number: [id number] of Birth/Sex: Treating RN: [birth year](55y.o. 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 - with Dr. Heber Reed Return A Other: - [facility]=Supplies Bathing/ Shower/ Hygiene May shower and wash wound with soap and water. - when dressings changed Edema Control - Lymphedema / SCD / Other Elevate legs to the level of the heart or above for 30 minutes daily and/or when sitting, a frequency of: Avoid standing for long periods of time. Additional Orders / Instructions Follow Nutritious Diet - 100-120g of protein daily Home Health dmit to [facility] for wound care. May utilize formulary equivalent dressing for wound treatment orders unless otherwise specified. - A 02/22/21: Will  submit order to [facility] agencies Wound Treatment Wound #1 - Ankle Wound Laterality: Right, Medial Cleanser: Soap and Water 1 x Per Day/30 Days Discharge Instructions: May shower and wash wound with dial antibacterial soap and water prior to dressing change. Cleanser: Wound Cleanser 1 x Per Day/30 Days Discharge Instructions: Cleanse the wound with wound cleanser prior to applying a clean dressing using gauze sponges, not tissue or cotton balls. Prim Dressing: KerraCel Ag Gelling Fiber Dressing, 4x5 in (silver alginate) 1 x Per Day/30 Days ary Discharge Instructions: Apply silver alginate to wound bed as instructed Prim Dressing: Santyl Ointment 1 x Per Day/30 Days ary Discharge Instructions: Apply nickel thick amount to wound bed as instructed Secondary Dressing: Woven Gauze Sponge, Non-Sterile 4x4 in 1 x Per Day/30 Days Discharge Instructions: Apply over primary dressing as directed. Secondary Dressing: ABD Pad, 8x10 1 x Per Day/30 Days Discharge Instructions: Apply over primary dressing as directed. Secured With: Elastic Bandage 4 inch (ACE bandage) 1 x Per Day/30 Days Discharge Instructions: Secure with ACE bandage as directed. Secured With: [employer], 4.5x3.1 (in/yd) 1 x Per Day/30 Days Discharge Instructions: Secure with Kerlix as directed. Secured With: Transpore Surgical Tape, 2x10 (in/yd) 1 x Per Day/30 Days Discharge Instructions: Secure dressing with tape as directed. Wound #2 - Foot Wound Laterality: Dorsal, Right Cleanser: Soap and Water 1 x Per Day/30 Days Discharge Instructions: May shower and wash wound with dial antibacterial soap and water prior to dressing change. Cleanser: Wound Cleanser 1 x Per Day/30 Days Discharge Instructions: Cleanse the wound with wound cleanser prior to applying a clean dressing using gauze sponges, not tissue or cotton balls. Prim Dressing: KerraCel Ag Gelling Fiber Dressing, 4x5 in (silver alginate) 1 x Per Day/30 Days ary Discharge  Instructions: Apply silver alginate to wound bed as instructed Prim Dressing: Santyl Ointment 1 x Per Day/30 Days ary Discharge Instructions: Apply nickel thick amount to wound bed as instructed Secondary Dressing: Woven Gauze Sponge, Non-Sterile 4x4 in 1 x Per Day/30 Days Discharge Instructions: Apply over primary dressing as directed. Secondary Dressing: ABD Pad, 8x10 1 x Per Day/30 Days Discharge Instructions: Apply over primary dressing as directed. Secured With: Elastic Bandage 4 inch (ACE bandage) 1 x Per Day/30 Days Discharge Instructions: Secure with ACE bandage as directed. Secured With: [employer], 4.5x3.1 (in/yd) 1 x Per Day/30 Days Discharge Instructions: Secure with Kerlix as directed. Secured With: Transpore Surgical Tape, 2x10 (in/yd) 1 x Per Day/30 Days Discharge Instructions: Secure dressing with tape as directed.  Wound #3 - Ankle Wound Laterality: Left, Medial Cleanser: Soap and Water 1 x Per Day/30 Days Discharge Instructions: May shower and wash wound with dial antibacterial soap and water prior to dressing change. Cleanser: Wound Cleanser 1 x Per Day/30 Days Discharge Instructions: Cleanse the wound with wound cleanser prior to applying a clean dressing using gauze sponges, not tissue or cotton balls. Prim Dressing: KerraCel Ag Gelling Fiber Dressing, 4x5 in (silver alginate) 1 x Per Day/30 Days ary Discharge Instructions: Apply silver alginate to wound bed as instructed Prim Dressing: Santyl Ointment 1 x Per Day/30 Days ary Discharge Instructions: Apply nickel thick amount to wound bed as instructed Secondary Dressing: Woven Gauze Sponge, Non-Sterile 4x4 in 1 x Per Day/30 Days Discharge Instructions: Apply over primary dressing as directed. Secondary Dressing: ABD Pad, 8x10 1 x Per Day/30 Days Discharge Instructions: Apply over primary dressing as directed. Secured With: Elastic Bandage 4 inch (ACE bandage) 1 x Per Day/30 Days Discharge Instructions:  Secure with ACE bandage as directed. Secured With: [employer], 4.5x3.1 (in/yd) 1 x Per Day/30 Days Discharge Instructions: Secure with Kerlix as directed. Secured With: Transpore Surgical Tape, 2x10 (in/yd) 1 x Per Day/30 Days Discharge Instructions: Secure dressing with tape as directed. Wound #4 - Ankle Wound Laterality: Left, Lateral Cleanser: Soap and Water 1 x Per Day/30 Days Discharge Instructions: May shower and wash wound with dial antibacterial soap and water prior to dressing change. Cleanser: Wound Cleanser 1 x Per Day/30 Days Discharge Instructions: Cleanse the wound with wound cleanser prior to applying a clean dressing using gauze sponges, not tissue or cotton balls. Prim Dressing: KerraCel Ag Gelling Fiber Dressing, 4x5 in (silver alginate) 1 x Per Day/30 Days ary Discharge Instructions: Apply silver alginate to wound bed as instructed Prim Dressing: Santyl Ointment 1 x Per Day/30 Days ary Discharge Instructions: Apply nickel thick amount to wound bed as instructed Secondary Dressing: Woven Gauze Sponge, Non-Sterile 4x4 in 1 x Per Day/30 Days Discharge Instructions: Apply over primary dressing as directed. Secondary Dressing: ABD Pad, 8x10 1 x Per Day/30 Days Discharge Instructions: Apply over primary dressing as directed. Secured With: Elastic Bandage 4 inch (ACE bandage) 1 x Per Day/30 Days Discharge Instructions: Secure with ACE bandage as directed. Secured With: [employer], 4.5x3.1 (in/yd) 1 x Per Day/30 Days Discharge Instructions: Secure with Kerlix as directed. Secured With: Transpore Surgical Tape, 2x10 (in/yd) 1 x Per Day/30 Days Discharge Instructions: Secure dressing with tape as directed. Wound #5 - Foot Wound Laterality: Dorsal, Left Cleanser: Soap and Water 1 x Per Day/30 Days Discharge Instructions: May shower and wash wound with dial antibacterial soap and water prior to dressing change. Cleanser: Wound Cleanser 1 x Per Day/30  Days Discharge Instructions: Cleanse the wound with wound cleanser prior to applying a clean dressing using gauze sponges, not tissue or cotton balls. Prim Dressing: KerraCel Ag Gelling Fiber Dressing, 4x5 in (silver alginate) 1 x Per Day/30 Days ary Discharge Instructions: Apply silver alginate to wound bed as instructed Prim Dressing: Santyl Ointment 1 x Per Day/30 Days ary Discharge Instructions: Apply nickel thick amount to wound bed as instructed Secondary Dressing: Woven Gauze Sponge, Non-Sterile 4x4 in 1 x Per Day/30 Days Discharge Instructions: Apply over primary dressing as directed. Secondary Dressing: ABD Pad, 8x10 1 x Per Day/30 Days Discharge Instructions: Apply over primary dressing as directed. Secured With: Elastic Bandage 4 inch (ACE bandage) 1 x Per Day/30 Days Discharge Instructions: Secure with ACE bandage as directed. Secured With: [employer]  Sterile, 4.5x3.1 (in/yd) 1 x Per Day/30 Days Discharge Instructions: Secure with Kerlix as directed. Secured With: Transpore Surgical Tape, 2x10 (in/yd) 1 x Per Day/30 Days Discharge Instructions: Secure dressing with tape as directed. Wound #6 - Foot Wound Laterality: Left, Medial Cleanser: Soap and Water 1 x Per Day/30 Days Discharge Instructions: May shower and wash wound with dial antibacterial soap and water prior to dressing change. Cleanser: Wound Cleanser 1 x Per Day/30 Days Discharge Instructions: Cleanse the wound with wound cleanser prior to applying a clean dressing using gauze sponges, not tissue or cotton balls. Prim Dressing: KerraCel Ag Gelling Fiber Dressing, 4x5 in (silver alginate) 1 x Per Day/30 Days ary Discharge Instructions: Apply silver alginate to wound bed as instructed Prim Dressing: Santyl Ointment 1 x Per Day/30 Days ary Discharge Instructions: Apply nickel thick amount to wound bed as instructed Secondary Dressing: Woven Gauze Sponge, Non-Sterile 4x4 in 1 x Per Day/30 Days Discharge Instructions:  Apply over primary dressing as directed. Secondary Dressing: ABD Pad, 8x10 1 x Per Day/30 Days Discharge Instructions: Apply over primary dressing as directed. Secured With: Elastic Bandage 4 inch (ACE bandage) 1 x Per Day/30 Days Discharge Instructions: Secure with ACE bandage as directed. Secured With: [employer], 4.5x3.1 (in/yd) 1 x Per Day/30 Days Discharge Instructions: Secure with Kerlix as directed. Secured With: Transpore Surgical Tape, 2x10 (in/yd) 1 x Per Day/30 Days Discharge Instructions: Secure dressing with tape as directed. Electronic Signature(s) Signed: 03/11/2021 5:27:23 PM By: Kalman Shan DO Entered By: Kalman Shan on 03/11/2021 17:22:56 -------------------------------------------------------------------------------- Problem List Details Patient Name: Date of Service: [name], [name] 03/11/2021 3:00 PM Medical Record Number: [id number] Patient Account Number: [id number] Date of Birth/Sex: Treating RN: [birth year] (55 y.o. 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 02/22/2021 No Yes L97.529 Non-pressure chronic ulcer of other part of left foot with unspecified severity 02/22/2021 No Yes I73.9 Peripheral vascular disease, unspecified 02/22/2021 No Yes E11.9 Type 2 diabetes mellitus without complications [id number] No Yes L93.2 Other local lupus erythematosus 02/22/2021 No Yes [id number] Chronic diastolic (congestive) heart failure 02/22/2021 No Yes I10 Essential (primary) hypertension 02/22/2021 No Yes E11.621 Type 2 diabetes mellitus with foot ulcer 02/22/2021 No Yes Inactive Problems Resolved Problems Electronic Signature(s) Signed:  03/11/2021 5:27:23 PM By: Kalman Shan DO Entered By: Kalman Shan on 03/11/2021 17:20:20 -------------------------------------------------------------------------------- Progress Note Details Patient Name: Date of Service: [name], [name] 03/11/2021 3:00 PM Medical Record Number: [id number] Patient Account Number: [id number] Date of Birth/Sex: Treating RN: 1965-08-16 (55 y.o. [name], [name] Primary Care Provider: [name] Other Clinician: Referring Provider: Treating Provider/Extender: Marisa Cyphers NCE, RO Oneita Hurt Weeks in Treatment: 2 Subjective Chief Complaint Information obtained from Patient Bilateral feet wounds History of Present Illness (HPI) Admission 8/22 Ms. Khristal Baydoun is a 55 year old female with a past medical history of diet-controlled type 2 diabetes, hypothyroidism, chronic diastolic heart failure, cutaneous lupus erythematosus and essential hypertension that presents to the clinic for a 64-monthhistory of worsening bilateral feet ulcers. She states that wounds to her feet have been an ongoing issue for several years. She states that with wound care they improve however they tend to wax and wane. She was hospitalized earlier in the  year in February for thyrotoxicosis. She was discharged to a nursing facility and she was provided wound care for her bilateral feet ulcers at that time. She states they used Silvadene and silver alginate. She states that when she was discharged in April from the nursing facility her wounds had improved but were not fully closed. She currently uses Silvadene but has not been using silver alginate for several months due to running out. She reports significant decline in her wound healing over the last 4 months. She reports pain to the wound sites. She currently denies increased warmth erythema or purulent drainage. 8/29; patient presents for 1 week follow-up. She states she had ABIs completed without  TBI's. She continues to use silver alginate with dressing changes. She reports continued pain. She denies signs of infection. 9/8; patient presents for 1 week follow-up. She was evaluated by Dr. Donzetta Matters and she reports follow-up with him in 1 month. She has been using Santyl to the wound beds. She reports some improvement in wound healing. She denies signs of infection. Patient History Information obtained from Patient. Family History Unknown History. Social History Never smoker, Marital Status - Single, Alcohol Use - Never, Drug Use - No History, Caffeine Use - Rarely. Medical History Hematologic/Lymphatic Patient has history of Anemia Cardiovascular Patient has history of Arrhythmia - A-Fib, Congestive Heart Failure, Hypertension Denies history of Angina Endocrine Patient has history of Type II Diabetes Immunological Patient has history of Lupus Erythematosus Medical A Surgical History Notes nd Endocrine Grave's disease Objective Constitutional respirations regular, non-labored and within target range for patient.. Vitals Time Taken: 3:40 PM, Height: 63 in, Weight: 157 lbs, BMI: 27.8, Temperature: 98.7 F, Pulse: 74 bpm, Respiratory Rate: 17 breaths/min, Blood Pressure: 113/74 mmHg. Psychiatric pleasant and cooperative. General Notes: Right lower extremity: T the medial ankle and dorsum of the right foot she has open wounds with necrotic debris and some granulation tissue o present. Left lower extremity: T the left medial ankle she has a small open wound with nonviable tissue and granulation tissue present. T the left lateral ankle o o she has a large open wound with granulation tissue and nonviable tissue present. T the left dorsum of the foot she has significant necrotic debris. T the o o medial left foot she has small open wounds with granulation tissue and nonviable tissue present. No obvious signs of infection to all wound sites. Difficult to palpate pedal pulses.  Improvement to the appearance since last clinic visit. Integumentary (Hair, Skin) Wound #1 status is Open. Original cause of wound was Gradually Appeared. The date acquired was: 04/03/2020. The wound has been in treatment 2 weeks. The wound is located on the Right,Medial Ankle. The wound measures 4cm length x 5cm width x 0.1cm depth; 15.708cm^2 area and 1.571cm^3 volume. There is Fat Layer (Subcutaneous Tissue) exposed. There is no tunneling or undermining noted. There is a medium amount of serosanguineous drainage noted. The wound margin is flat and intact. There is small (1-33%) red granulation within the wound bed. There is a large (67-100%) amount of necrotic tissue within the wound bed including Eschar and Adherent Slough. Wound #2 status is Open. Original cause of wound was Gradually Appeared. The date acquired was: 04/03/2020. The wound has been in treatment 2 weeks. The wound is located on the Right,Dorsal Foot. The wound measures 4cm length x 5cm width x 0.1cm depth; 15.708cm^2 area and 1.571cm^3 volume. There is Fat Layer (Subcutaneous Tissue) exposed. There is no tunneling or undermining noted. There is a  medium amount of serosanguineous drainage noted. The wound margin is flat and intact. There is medium (34-66%) red granulation within the wound bed. There is a medium (34-66%) amount of necrotic tissue within the wound bed including Adherent Slough. Wound #3 status is Open. Original cause of wound was Gradually Appeared. The date acquired was: 02/22/2021. The wound has been in treatment 2 weeks. The wound is located on the Left,Medial Ankle. The wound measures 0.5cm length x 0.5cm width x 0.1cm depth; 0.196cm^2 area and 0.02cm^3 volume. There is Fat Layer (Subcutaneous Tissue) exposed. There is no tunneling or undermining noted. There is a small amount of serosanguineous drainage noted. The wound margin is flat and intact. There is no granulation within the wound bed. There is no necrotic  tissue within the wound bed. Wound #4 status is Open. Original cause of wound was Gradually Appeared. The date acquired was: 04/03/2020. The wound has been in treatment 2 weeks. The wound is located on the Left,Lateral Ankle. The wound measures 8cm length x 5cm width x 0.2cm depth; 31.416cm^2 area and 6.283cm^3 volume. There is Fat Layer (Subcutaneous Tissue) exposed. There is no tunneling or undermining noted. There is a medium amount of serosanguineous drainage noted. The wound margin is flat and intact. There is medium (34-66%) red granulation within the wound bed. There is a medium (34-66%) amount of necrotic tissue within the wound bed including Adherent Slough. Wound #5 status is Open. Original cause of wound was Gradually Appeared. The date acquired was: 04/03/2020. The wound has been in treatment 2 weeks. The wound is located on the Left,Dorsal Foot. The wound measures 8cm length x 5cm width x 0.2cm depth; 31.416cm^2 area and 6.283cm^3 volume. There is Fat Layer (Subcutaneous Tissue) exposed. There is no tunneling or undermining noted. There is a medium amount of serosanguineous drainage noted. The wound margin is distinct with the outline attached to the wound base. There is small (1-33%) red granulation within the wound bed. There is a large (67-100%) amount of necrotic tissue within the wound bed including Eschar. Wound #6 status is Open. Original cause of wound was Gradually Appeared. The date acquired was: 04/03/2020. The wound has been in treatment 2 weeks. The wound is located on the Left,Medial Foot. The wound measures 2cm length x 4cm width x 0.2cm depth; 6.283cm^2 area and 1.257cm^3 volume. There is Fat Layer (Subcutaneous Tissue) exposed. There is no tunneling or undermining noted. There is a none present amount of drainage noted. The wound margin is flat and intact. There is no granulation within the wound bed. There is a large (67-100%) amount of necrotic tissue within the wound bed  including Eschar. 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 She has more granulation tissue present. I debrided nonviable tissue. No signs of infection. I recommended continuing Santyl with silver alginate. Patient was evaluated by Dr. Donzetta Matters yesterday. There is no concern for arterial insufficiency. She will follow-up with him for reflux studies. Patient still has pain to the wound beds. Once we have a better surface I think she would benefit from light compression if she can tolerate this. Procedures Wound #1 Pre-procedure diagnosis of Wound #1 is a Diabetic Wound/Ulcer of  the Lower Extremity located on the Right,Medial Ankle .Severity of Tissue Pre Debridement is: Fat layer exposed. There was a Excisional Skin/Subcutaneous Tissue Debridement with a total area of 20 sq cm performed by Kalman Shan, DO. With the following instrument(s): Curette to remove Viable and Non-Viable tissue/material. Material removed includes Subcutaneous Tissue, Slough, Skin: Dermis, and Skin: Epidermis after achieving pain control using Lidocaine. No specimens were taken. A time out was conducted at 16:30, prior to the start of the procedure. A Minimum amount of bleeding was controlled with Pressure. The procedure was tolerated well with a pain level of 0 throughout and a pain level of 0 following the procedure. Post Debridement Measurements: 4cm length x 5cm width x 0.1cm depth; 1.571cm^3 volume. Character of Wound/Ulcer Post Debridement is improved. Severity of Tissue Post  Debridement is: Fat layer exposed. Post procedure Diagnosis Wound #1: Same as Pre-Procedure Wound #5 Pre-procedure diagnosis of Wound #5 is a Diabetic Wound/Ulcer of the Lower Extremity located on the Left,Dorsal Foot .Severity of Tissue Pre Debridement is: Fat layer exposed. There was a Excisional Skin/Subcutaneous Tissue Debridement with a total area of 40 sq cm performed by Kalman Shan, DO. With the following instrument(s): Curette to remove Viable and Non-Viable tissue/material. Material removed includes Subcutaneous Tissue, Slough, Skin: Dermis, and Skin: Epidermis after achieving pain control using Lidocaine. No specimens were taken. A time out was conducted at 16:30, prior to the start of the procedure. A Minimum amount of bleeding was controlled with Pressure. The procedure was tolerated well with a pain level of 0 throughout and a pain level of 0 following the procedure. Post Debridement Measurements: 8cm length x 5cm width x 0.2cm depth; 6.283cm^3 volume. Character of Wound/Ulcer Post Debridement is improved. Severity of Tissue Post Debridement is: Fat layer exposed. Post procedure Diagnosis Wound #5: Same as Pre-Procedure Plan Follow-up Appointments: Return Appointment in 1 week. - with Dr. Heber Groveton Other: - [facility]=Supplies Bathing/ Shower/ Hygiene: May shower and wash wound with soap and water. - when dressings changed Edema Control - Lymphedema / SCD / Other: Elevate legs to the level of the heart or above for 30 minutes daily and/or when sitting, a frequency of: Avoid standing for long periods of time. Additional Orders / Instructions: Follow Nutritious Diet - 100-120g of protein daily Home Health: Admit to [facility] for wound care. May utilize formulary equivalent dressing for wound treatment orders unless otherwise specified. - 02/22/21: Will submit order to [facility] #1: - Ankle Wound Laterality: Right, Medial Cleanser: Soap and Water 1 x Per Day/30  Days Discharge Instructions: May shower and wash wound with dial antibacterial soap and water prior to dressing change. Cleanser: Wound Cleanser 1 x Per Day/30 Days Discharge Instructions: Cleanse the wound with wound cleanser prior to applying a clean dressing using gauze sponges, not tissue or cotton balls. Prim Dressing: KerraCel Ag Gelling Fiber Dressing, 4x5 in (silver alginate) 1 x Per Day/30 Days ary Discharge Instructions: Apply silver alginate to wound bed as instructed Prim Dressing: Santyl Ointment 1 x Per Day/30 Days ary Discharge Instructions: Apply nickel thick amount to wound bed as instructed Secondary Dressing: Woven Gauze Sponge, Non-Sterile 4x4 in 1 x Per Day/30 Days Discharge Instructions: Apply over primary dressing as directed. Secondary Dressing: ABD Pad, 8x10 1 x Per Day/30 Days Discharge Instructions: Apply over primary dressing as directed. Secured With: Elastic Bandage 4 inch (ACE bandage) 1 x Per Day/30 Days Discharge Instructions: Secure with ACE bandage as directed. Secured With: [employer], 4.5x3.1 (  in/yd) 1 x Per Day/30 Days Discharge Instructions: Secure with Kerlix as directed. Secured With: Transpore Surgical T ape, 2x10 (in/yd) 1 x Per Day/30 Days Discharge Instructions: Secure dressing with tape as directed. WOUND #2: - Foot Wound Laterality: Dorsal, Right Cleanser: Soap and Water 1 x Per Day/30 Days Discharge Instructions: May shower and wash wound with dial antibacterial soap and water prior to dressing change. Cleanser: Wound Cleanser 1 x Per Day/30 Days Discharge Instructions: Cleanse the wound with wound cleanser prior to applying a clean dressing using gauze sponges, not tissue or cotton balls. Prim Dressing: KerraCel Ag Gelling Fiber Dressing, 4x5 in (silver alginate) 1 x Per Day/30 Days ary Discharge Instructions: Apply silver alginate to wound bed as instructed Prim Dressing: Santyl Ointment 1 x Per Day/30 Days ary Discharge  Instructions: Apply nickel thick amount to wound bed as instructed Secondary Dressing: Woven Gauze Sponge, Non-Sterile 4x4 in 1 x Per Day/30 Days Discharge Instructions: Apply over primary dressing as directed. Secondary Dressing: ABD Pad, 8x10 1 x Per Day/30 Days Discharge Instructions: Apply over primary dressing as directed. Secured With: Elastic Bandage 4 inch (ACE bandage) 1 x Per Day/30 Days Discharge Instructions: Secure with ACE bandage as directed. Secured With: [employer], 4.5x3.1 (in/yd) 1 x Per Day/30 Days Discharge Instructions: Secure with Kerlix as directed. Secured With: Transpore Surgical T ape, 2x10 (in/yd) 1 x Per Day/30 Days Discharge Instructions: Secure dressing with tape as directed. WOUND #3: - Ankle Wound Laterality: Left, Medial Cleanser: Soap and Water 1 x Per Day/30 Days Discharge Instructions: May shower and wash wound with dial antibacterial soap and water prior to dressing change. Cleanser: Wound Cleanser 1 x Per Day/30 Days Discharge Instructions: Cleanse the wound with wound cleanser prior to applying a clean dressing using gauze sponges, not tissue or cotton balls. Prim Dressing: KerraCel Ag Gelling Fiber Dressing, 4x5 in (silver alginate) 1 x Per Day/30 Days ary Discharge Instructions: Apply silver alginate to wound bed as instructed Prim Dressing: Santyl Ointment 1 x Per Day/30 Days ary Discharge Instructions: Apply nickel thick amount to wound bed as instructed Secondary Dressing: Woven Gauze Sponge, Non-Sterile 4x4 in 1 x Per Day/30 Days Discharge Instructions: Apply over primary dressing as directed. Secondary Dressing: ABD Pad, 8x10 1 x Per Day/30 Days Discharge Instructions: Apply over primary dressing as directed. Secured With: Elastic Bandage 4 inch (ACE bandage) 1 x Per Day/30 Days Discharge Instructions: Secure with ACE bandage as directed. Secured With: [employer], 4.5x3.1 (in/yd) 1 x Per Day/30 Days Discharge Instructions:  Secure with Kerlix as directed. Secured With: Transpore Surgical T ape, 2x10 (in/yd) 1 x Per Day/30 Days Discharge Instructions: Secure dressing with tape as directed. WOUND #4: - Ankle Wound Laterality: Left, Lateral Cleanser: Soap and Water 1 x Per Day/30 Days Discharge Instructions: May shower and wash wound with dial antibacterial soap and water prior to dressing change. Cleanser: Wound Cleanser 1 x Per Day/30 Days Discharge Instructions: Cleanse the wound with wound cleanser prior to applying a clean dressing using gauze sponges, not tissue or cotton balls. Prim Dressing: KerraCel Ag Gelling Fiber Dressing, 4x5 in (silver alginate) 1 x Per Day/30 Days ary Discharge Instructions: Apply silver alginate to wound bed as instructed Prim Dressing: Santyl Ointment 1 x Per Day/30 Days ary Discharge Instructions: Apply nickel thick amount to wound bed as instructed Secondary Dressing: Woven Gauze Sponge, Non-Sterile 4x4 in 1 x Per Day/30 Days Discharge Instructions: Apply over primary dressing as directed. Secondary Dressing: ABD Pad, 8x10 1 x  Per Day/30 Days Discharge Instructions: Apply over primary dressing as directed. Secured With: Elastic Bandage 4 inch (ACE bandage) 1 x Per Day/30 Days Discharge Instructions: Secure with ACE bandage as directed. Secured With: [employer], 4.5x3.1 (in/yd) 1 x Per Day/30 Days Discharge Instructions: Secure with Kerlix as directed. Secured With: Transpore Surgical T ape, 2x10 (in/yd) 1 x Per Day/30 Days Discharge Instructions: Secure dressing with tape as directed. WOUND #5: - Foot Wound Laterality: Dorsal, Left Cleanser: Soap and Water 1 x Per Day/30 Days Discharge Instructions: May shower and wash wound with dial antibacterial soap and water prior to dressing change. Cleanser: Wound Cleanser 1 x Per Day/30 Days Discharge Instructions: Cleanse the wound with wound cleanser prior to applying a clean dressing using gauze sponges, not tissue or  cotton balls. Prim Dressing: KerraCel Ag Gelling Fiber Dressing, 4x5 in (silver alginate) 1 x Per Day/30 Days ary Discharge Instructions: Apply silver alginate to wound bed as instructed Prim Dressing: Santyl Ointment 1 x Per Day/30 Days ary Discharge Instructions: Apply nickel thick amount to wound bed as instructed Secondary Dressing: Woven Gauze Sponge, Non-Sterile 4x4 in 1 x Per Day/30 Days Discharge Instructions: Apply over primary dressing as directed. Secondary Dressing: ABD Pad, 8x10 1 x Per Day/30 Days Discharge Instructions: Apply over primary dressing as directed. Secured With: Elastic Bandage 4 inch (ACE bandage) 1 x Per Day/30 Days Discharge Instructions: Secure with ACE bandage as directed. Secured With: [employer], 4.5x3.1 (in/yd) 1 x Per Day/30 Days Discharge Instructions: Secure with Kerlix as directed. Secured With: Trans[employer] Surgical T ape, 2x10 (in/yd) 1 x Per Day/30 Days Discharge Instructions: Secure dressing with tape as directed. WOUND #6: - Foot Wound Laterality: Left, Medial Cleanser: Soap and Water 1 x Per Day/30 Days Discharge Instructions: May shower and wash wound with dial antibacterial soap and water prior to dressing change. Cleanser: Wound Cleanser 1 x Per Day/30 Days Discharge Instructions: Cleanse the wound with wound cleanser prior to applying a clean dressing using gauze sponges, not tissue or cotton balls. Prim Dressing: KerraCel Ag Gelling Fiber Dressing, 4x5 in (silver alginate) 1 x Per Day/30 Days ary Discharge Instructions: Apply silver alginate to wound bed as instructed Prim Dressing: Santyl Ointment 1 x Per Day/30 Days ary Discharge Instructions: Apply nickel thick amount to wound bed as instructed Secondary Dressing: Woven Gauze Sponge, Non-Sterile 4x4 in 1 x Per Day/30 Days Discharge Instructions: Apply over primary dressing as directed. Secondary Dressing: ABD Pad, 8x10 1 x Per Day/30 Days Discharge Instructions: Apply over  primary dressing as directed. Secured With: Elastic Bandage 4 inch (ACE bandage) 1 x Per Day/30 Days Discharge Instructions: Secure with ACE bandage as directed. Secured With: [employer], 4.5x3.1 (in/yd) 1 x Per Day/30 Days Discharge Instructions: Secure with Kerlix as directed. Secured With: Trans[employer] Surgical T ape, 2x10 (in/yd) 1 x Per Day/30 Days Discharge Instructions: Secure dressing with tape as directed. 1. Continue silver alginate and Santyl 2. Follow-up in 1 week Electronic Signature(s) Signed: 03/11/2021 5:27:23 PM By: Kalman Shan DO Entered By: Kalman Shan on 03/11/2021 17:26:40 -------------------------------------------------------------------------------- HxROS Details Patient Name: Date of Service: [name], [name] 03/11/2021 3:00 PM Medical Record Number: [id number] Patient Account Number: [id number] Date of Birth/Sex: Treating RN: 1965-10-16 (55 y.o. [name], [name] Primary Care Provider: [name] Other Clinician: Referring Provider: Treating Provider/Extender: Marisa Cyphers NCE, RO Oneita Hurt Weeks in Treatment: 2 Information Obtained From Patient Hematologic/Lymphatic Medical History: Positive for: Anemia Cardiovascular Medical History:  Positive for: Arrhythmia - A-Fib; Congestive Heart Failure; Hypertension Negative for: Angina Endocrine Medical History: Positive for: Type II Diabetes Past Medical History Notes: Grave's disease Time with diabetes: over 10 years Treated with: Diet Blood sugar tested every day: No Immunological Medical History: Positive for: Lupus Erythematosus Immunizations Pneumococcal Vaccine: Received Pneumococcal Vaccination: No Implantable Devices None Family and Social History Unknown History: Yes; Never smoker; Marital Status - Single; Alcohol Use: Never; Drug Use: No History; Caffeine Use: Rarely; Financial Concerns: No; Food, Clothing or Shelter Needs: No; Support System  Lacking: No; Transportation Concerns: No Electronic Signature(s) Signed: 03/11/2021 5:27:23 PM By: Kalman Shan DO Signed: 03/11/2021 6:08:32 PM By: [name] RN Entered By: Kalman Shan on 03/11/2021 17:21:32 -------------------------------------------------------------------------------- SuperBill Details Patient Name: Date of Service: [name], [name] 03/11/2021 Medical Record Number: [id number] Patient Account Number: [id number] Date of Birth/Sex: Treating RN: 03-Jun-1966 (55 y.o. 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 I73.9 Peripheral vascular disease, unspecified E11.9 Type 2 diabetes mellitus without complications [id number] Other local lupus erythematosus [id number] Chronic diastolic (congestive) heart failure I10 Essential (primary) hypertension E11.621 Type 2 diabetes mellitus with foot ulcer Facility Procedures CPT4 Code: [id number] Description: [id number] - DEB SUBQ TISSUE 20 SQ CM/< ICD-10 Diagnosis Description L97.529 Non-pressure chronic ulcer of other part of left foot with unspecified severit Modifier: y Quantity: 1 CPT4 Code: [id number] Description: [id number] - DEB SUBQ TISS EA ADDL 20CM ICD-10 Diagnosis Description L97.519 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

## 2021-03-18 ENCOUNTER — Other Ambulatory Visit: Payer: Self-pay

## 2021-03-18 ENCOUNTER — Encounter (HOSPITAL_BASED_OUTPATIENT_CLINIC_OR_DEPARTMENT_OTHER): Payer: Medicaid Other | Admitting: Internal Medicine

## 2021-03-18 DIAGNOSIS — L97519 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: Secondary | ICD-10-CM | POA: Diagnosis not present

## 2021-03-18 DIAGNOSIS — E11621 Type 2 diabetes mellitus with foot ulcer: Secondary | ICD-10-CM | POA: Diagnosis not present

## 2021-03-18 DIAGNOSIS — L932 Other local lupus erythematosus: Secondary | ICD-10-CM | POA: Diagnosis not present

## 2021-03-18 DIAGNOSIS — L97529 Non-pressure chronic ulcer of other part of left foot with unspecified severity: Secondary | ICD-10-CM

## 2021-03-18 NOTE — Progress Notes (Signed)
Candace Robinson, Candace Robinson ([id number]) Visit Report for 03/18/2021 Chief Complaint Document Details Patient Name: Date of Service: [name], [name] 03/18/2021 8:45 A M Medical Record Number: [id number] Patient Account Number: [id number] Date of Birth/Sex: Treating RN: 07/03/1966 (55 y.o. Candace Robinson, Candace Robinson Primary Care Provider: [name] Other Clinician: Referring Provider: Treating Provider/Extender: Darcey Nora in Treatment: 3 Information Obtained from: Patient Chief Complaint Bilateral feet wounds Electronic Signature(s) Signed: 03/18/2021 9:50:05 AM By: Kalman Shan DO Entered By: Kalman Shan on 03/18/2021 09:36:32 -------------------------------------------------------------------------------- Debridement Details Patient Name: Date of Service: [name], [name] 03/18/2021 8:45 A M Medical Record Number: [id number] Patient Account Number: [id number] Date of Birth/Sex: Treating RN: 1966/01/25 (55 y.o. Candace Robinson Primary Care Provider: [name] Other Clinician: Referring Provider: Treating Provider/Extender: [name] Weeks in Treatment: 3 Debridement Performed for Assessment: Wound #4 Left,Lateral Ankle Performed By: Physician Kalman Shan, DO Debridement Type: Debridement Severity of Tissue Pre Debridement: Fat layer exposed Level of Consciousness (Pre-procedure): Awake and Alert Pre-procedure Verification/Time Out Yes - 09:19 Taken: Start Time: 09:20 Pain Control: Lidocaine 5% topical ointment T Area Debrided (L x W): otal 7 (cm) x 5 (cm) = 35 (cm) Tissue and other material debrided: Non-Viable, Slough, Slough Level: Non-Viable Tissue Debridement Description: Selective/Open Wound Instrument: Curette Bleeding: Minimum Hemostasis Achieved: Pressure End Time: 09:22 Response to Treatment: Procedure was tolerated well Level of Consciousness (Post- Awake and  Alert procedure): Post Debridement Measurements of Total Wound Length: (cm) 8 Width: (cm) 5.5 Depth: (cm) 0.1 Volume: (cm) 3.456 Character of Wound/Ulcer Post Debridement: Stable Severity of Tissue Post Debridement: Fat layer exposed Post Procedure Diagnosis Same as Pre-procedure Electronic Signature(s) Signed: 03/18/2021 9:50:05 AM By: Kalman Shan DO Signed: 03/18/2021 5:57:57 PM By: Lorrin Jackson Entered By: Lorrin Jackson on 03/18/2021 09:25:44 -------------------------------------------------------------------------------- Debridement Details Patient Name: Date of Service: [name], [name] 03/18/2021 8:45 A M Medical Record Number: [id number] Patient Account Number: [id number] Date of Birth/Sex: Treating RN: 03/31/1966 (55 y.o. Candace Robinson Primary Care Provider: [name] Other Clinician: Referring Provider: Treating Provider/Extender: Darcey Nora in Treatment: 3 Debridement Performed for Assessment: Wound #2 Right,Dorsal Foot Performed By: Physician Kalman Shan, DO Debridement Type: Debridement Severity of Tissue Pre Debridement: Fat layer exposed Level of Consciousness (Pre-procedure): Awake and Alert Pre-procedure Verification/Time Out Yes - 09:19 Taken: Start Time: 09:22 Pain Control: Lidocaine 5% topical ointment T Area Debrided (L x W): otal 4 (cm) x 4 (cm) = 16 (cm) Tissue and other material debrided: Non-Viable, Slough, Slough Level: Non-Viable Tissue Debridement Description: Selective/Open Wound Instrument: Curette Bleeding: Minimum Hemostasis Achieved: Pressure End Time: 09:24 Response to Treatment: Procedure was tolerated well Level of Consciousness (Post- Awake and Alert procedure): Post Debridement Measurements of Total Wound Length: (cm) 4.5 Width: (cm) 4.7 Depth: (cm) 0.1 Volume: (cm) 1.661 Character of Wound/Ulcer Post Debridement: Stable Severity of Tissue Post Debridement: Fat  layer exposed Post Procedure Diagnosis Same as Pre-procedure Electronic Signature(s) Signed: 03/18/2021 9:50:05 AM By: Kalman Shan DO Signed: 03/18/2021 5:57:57 PM By: Lorrin Jackson Entered By: Lorrin Jackson on 03/18/2021 09:26:49 -------------------------------------------------------------------------------- Debridement Details Patient Name: Date of Service: [name], [name] 03/18/2021 8:45 A M Medical Record Number: [id number] Patient Account Number: [id number] Date of Birth/Sex: Treating RN: [birth year] (55 y.o. Candace Robinson Primary Care Provider: [name] Other Clinician: Referring Provider: Treating Provider/Extender: [name] Weeks in Treatment: 3 Debridement  Performed for Assessment: Wound #5 Left,Dorsal Foot Performed By: Physician Kalman Shan, DO Debridement Type: Debridement Severity of Tissue Pre Debridement: Fat layer exposed Level of Consciousness (Pre-procedure): Awake and Alert Pre-procedure Verification/Time Out Yes - 09:19 Taken: Start Time: 09:24 Pain Control: Lidocaine 5% topical ointment T Area Debrided (L x W): otal 5 (cm) x 5 (cm) = 25 (cm) Tissue and other material debrided: Non-Viable, Slough, Slough Level: Non-Viable Tissue Debridement Description: Selective/Open Wound Instrument: Blade Bleeding: Minimum Hemostasis Achieved: Pressure End Time: 09:26 Response to Treatment: Procedure was tolerated well Level of Consciousness (Post- Awake and Alert procedure): Post Debridement Measurements of Total Wound Length: (cm) 9.5 Width: (cm) 9.2 Depth: (cm) 0.2 Volume: (cm) 13.729 Character of Wound/Ulcer Post Debridement: Stable Severity of Tissue Post Debridement: Fat layer exposed Post Procedure Diagnosis Same as Pre-procedure Electronic Signature(s) Signed: 03/18/2021 9:50:05 AM By: Kalman Shan DO Signed: 03/18/2021 5:57:57 PM By: Lorrin Jackson Entered By: Lorrin Jackson on 03/18/2021  09:28:12 -------------------------------------------------------------------------------- Debridement Details Patient Name: Date of Service: [name], [name] 03/18/2021 8:45 A M Medical Record Number: [id number] Patient Account Number: [id number] Date of Birth/Sex: Treating RN: 05-26-1966 (55 y.o. Candace Robinson Primary Care Provider: [name] Other Clinician: Referring Provider: Treating Provider/Extender: Darcey Nora in Treatment: 3 Debridement Performed for Assessment: Wound #1 Right,Medial Ankle Performed By: Physician Kalman Shan, DO Debridement Type: Debridement Severity of Tissue Pre Debridement: Fat layer exposed Level of Consciousness (Pre-procedure): Awake and Alert Pre-procedure Verification/Time Out Yes - 09:19 Taken: Start Time: 09:26 Pain Control: Lidocaine 5% topical ointment T Area Debrided (L x W): otal 3 (cm) x 3 (cm) = 9 (cm) Tissue and other material debrided: Non-Viable, Slough, Slough Level: Non-Viable Tissue Debridement Description: Selective/Open Wound Instrument: Blade Bleeding: Minimum Hemostasis Achieved: Pressure End Time: 09:28 Response to Treatment: Procedure was tolerated well Level of Consciousness (Post- Awake and Alert procedure): Post Debridement Measurements of Total Wound Length: (cm) 5 Width: (cm) 4 Depth: (cm) 0.2 Volume: (cm) 3.142 Character of Wound/Ulcer Post Debridement: Stable Severity of Tissue Post Debridement: Fat layer exposed Post Procedure Diagnosis Same as Pre-procedure Electronic Signature(s) Signed: 03/18/2021 9:50:05 AM By: Kalman Shan DO Signed: 03/18/2021 5:57:57 PM By: Lorrin Jackson Entered By: Lorrin Jackson on 03/18/2021 09:29:12 -------------------------------------------------------------------------------- HPI Details Patient Name: Date of Service: [name], [name] 03/18/2021 8:45 A M Medical Record Number: [id number] Patient Account  Number: [id number] Date of Birth/Sex: Treating RN: [birth year] (55 y.o. Candace Robinson, Candace Robinson Primary Care Provider: [name] Other Clinician: Referring Provider: Treating Provider/Extender: [name] Weeks in Treatment: 3 History of Present Illness HPI Description: Admission 8/22 Ms. Zolie Louque is a 55 year old female with a past medical history of diet-controlled type 2 diabetes, hypothyroidism, chronic diastolic heart failure, cutaneous lupus erythematosus and essential hypertension that presents to the clinic for a 39-monthhistory of worsening bilateral feet ulcers. She states that wounds to her feet have been an ongoing issue for several years. She states that with wound care they improve however they tend to wax and wane. 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 She was discharged to a nursing facility and she was provided wound care for her bilateral feet ulcers at that time. She states they used Silvadene and silver alginate. She states that when she was discharged in April from the nursing facility her wounds had improved but were not fully closed. She currently uses Silvadene but has not been using silver alginate  for several months due to running out. She reports significant decline in her wound healing over the last 4 months. She reports pain to the wound sites. She currently denies increased warmth erythema or purulent drainage. 8/29; patient presents for 1 week follow-up. She states she had ABIs completed without TBI's. She continues to use silver alginate with dressing changes. She reports continued pain. She denies signs of infection. 9/8; patient presents for 1 week follow-up. She was evaluated by Dr. Donzetta Matters and she reports follow-up with him in 1 month. She has been using Santyl to the wound beds. She reports some improvement in wound healing. She denies signs of infection. 9/15; patient presents for follow-up.  She continues to use Santyl and silver alginate to the wound beds. She states the alginate is sticking and hard to remove with dressing changes. She currently denies signs of infection. Electronic Signature(s) Signed: 03/18/2021 9:50:05 AM By: Kalman Shan DO Entered By: Kalman Shan on 03/18/2021 09:37:28 -------------------------------------------------------------------------------- Physical Exam Details Patient Name: Date of Service: [name], [name] 03/18/2021 8:45 A M Medical Record Number: [id number] Patient Account Number: [id number] Date of Birth/Sex: Treating RN: 1965-08-18 (55 y.o. Candace Robinson, Candace Robinson Primary Care Provider: [name] Other Clinician: Referring Provider: Treating Provider/Extender: [name] Weeks in Treatment: 3 Constitutional respirations regular, non-labored and within target range for patient.Marland Kitchen Psychiatric pleasant and cooperative. Notes Right lower extremity: T the medial ankle and dorsum of the right foot she has open wounds with necrotic debris and some granulation tissue present. Left o lower extremity: T the left medial ankle she has a small open wound with nonviable tissue and granulation tissue present. T the left lateral ankle she has a o o large open wound with granulation tissue and nonviable tissue present. T the left dorsum of the foot she has significant necrotic debris. No obvious signs of o infection to all wound sites. Difficult to palpate pedal pulses. Improvement to the appearance since last clinic visit. Electronic Signature(s) Signed: 03/18/2021 9:50:05 AM By: Kalman Shan DO Entered By: Kalman Shan on 03/18/2021 09:41:01 -------------------------------------------------------------------------------- Physician Orders Details Patient Name: Date of Service: [name], [name] 03/18/2021 8:45 A M Medical Record Number: [id number] Patient Account Number: [id number] Date of  Birth/Sex: Treating RN: Aug 17, 1965 (55 y.o. 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 - with Dr. Heber [place] Return A Other: - [facility]=Supplies Bathing/ Shower/ Hygiene May shower and wash wound with soap and water. - when dressings changed Edema Control - Lymphedema / SCD / Other Elevate legs to the level of the heart or above for 30 minutes daily and/or when sitting, a frequency of: Avoid standing for long periods of time. Additional Orders / Instructions Follow Nutritious Diet - 100-120g of protein daily Home Health dmit to [place] for wound care. May utilize formulary equivalent dressing for wound treatment orders unless otherwise specified. - A 02/22/21: Will submit order to [facility] agencies  03/18/21: Declined by [facility] erobe culture (MICRO) - Left Foot Bacteria identified in Unspecified specimen by A LOINC Code: [id number] Convenience Name: Areobic culture-specimen not specified Patient Medications llergies: Motrin A Notifications Medication Indication Start End 03/18/2021 Santyl DOSE topical 250 unit/gram ointment - Apply 1 application to wounds daily, ointment topical x 30days Electronic Signature(s) Signed: 03/18/2021 1:23:01 PM By: Kalman Shan DO Signed: 03/18/2021 5:57:57 PM By: Lorrin Jackson Previous Signature: 03/18/2021 9:50:05 AM Version By: Kalman Shan DO Entered By: Lorrin Jackson on 03/18/2021 10:17:27 Prescription 03/18/2021 -------------------------------------------------------------------------------- Mcelhinny, Ebbie L. Kalman Shan DO Patient Name: Provider: [birth year] [id number] Date of Birth: NPI#: [id number] Sex: DEA #: [phone] Phone #: License #: [place] Patient Address: [address], [place] [address] [place], [place] [address] [phone] Allergies Motrin Medication Medication: Route: Strength: Form: Santyl 250 unit/gram topical ointment topical 250 unit/gram ointment Class: TOPICAL/MUCOUS MEMBR./SUBCUT ENZYMES . Dose: Frequency / Time: Indication: Apply 1 application to wounds daily, ointment topical x 30days Number of Refills: Number of Units: 2 Thirty (30) Generic Substitution: Start Date: End Date: One Time Use: Substitution Permitted [id number] No Note to Pharmacy: Hand Signature: Date(s): Electronic Signature(s) Signed: 03/18/2021 1:23:01 PM By: Kalman Shan DO Signed: 03/18/2021 5:57:57 PM By: Lorrin Jackson Previous Signature: 03/18/2021 9:50:05 AM Version By: Kalman Shan DO Entered By: Lorrin Jackson on 03/18/2021 10:17:28 -------------------------------------------------------------------------------- Problem List Details Patient Name: Date of Service: [name], [name] 03/18/2021 8:45 A M Medical Record Number: [id number] Patient Account Number: [id number] Date of Birth/Sex: Treating RN: February 16, 1966 (55 y.o. 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 02/22/2021 No Yes L97.529 Non-pressure chronic ulcer of other part of left foot with unspecified severity 02/22/2021 No Yes I73.9 Peripheral vascular disease, unspecified 02/22/2021 No Yes E11.9 Type 2 diabetes mellitus without complications [id number] No Yes L93.2 Other local lupus erythematosus 02/22/2021 No Yes [id number] Chronic diastolic (congestive) heart failure 02/22/2021 No Yes I10 Essential (primary) hypertension 02/22/2021 No Yes E11.621 Type 2 diabetes mellitus with foot ulcer 02/22/2021 No Yes Inactive Problems Resolved Problems Electronic Signature(s) Signed: 03/18/2021 9:50:05 AM By: Kalman Shan DO Entered By: Kalman Shan on 03/18/2021 09:36:14 -------------------------------------------------------------------------------- Progress Note Details Patient Name: Date of Service: [name], [name] 03/18/2021 8:45 A M Medical Record Number: [id number] Patient Account Number: [id number] Date of Birth/Sex: Treating RN: 1965/12/30 (55 y.o. Candace Robinson, Candace Robinson Primary Care Provider: [name] Other Clinician: Referring Provider: Treating Provider/Extender: Darcey Nora in Treatment: 3 Subjective Chief Complaint Information obtained from Patient Bilateral feet wounds History of Present Illness (HPI) Admission 8/22 Ms. Breyah Sandford is a 55 year old female with a past medical history of diet-controlled type 2 diabetes, hypothyroidism, chronic diastolic heart failure, cutaneous lupus erythematosus and essential hypertension that presents to the clinic for a 89-monthhistory of worsening bilateral feet ulcers. She states that wounds to her feet have been an ongoing issue for several years. She states that with wound care they improve however they tend to wax and wane. She was hospitalized earlier in the year in February for  thyrotoxicosis. She was discharged to a nursing facility and she was provided wound care for her bilateral feet ulcers  at that time. She states they used Silvadene and silver alginate. She states that when she was discharged in April from the nursing facility her wounds had improved but were not fully closed. She currently uses Silvadene but has not been using silver alginate for several months due to running out. She reports significant decline in her wound healing over the last 4 months. She reports pain to the wound sites. She currently denies increased warmth erythema or purulent drainage. 8/29; patient presents for 1 week follow-up. She states she had ABIs completed without TBI's. She continues to use silver alginate with dressing changes. She reports continued pain. She denies signs of infection. 9/8; patient presents for 1 week follow-up. She was evaluated by Dr. Donzetta Matters and she reports follow-up with him in 1 month. She has been using Santyl to the wound beds. She reports some improvement in wound healing. She denies signs of infection. 9/15; patient presents for follow-up. She continues to use Santyl and silver alginate to the wound beds. She states the alginate is sticking and hard to remove with dressing changes. She currently denies signs of infection. Patient History Information obtained from Patient. Family History Unknown History. Social History Never smoker, Marital Status - Single, Alcohol Use - Never, Drug Use - No History, Caffeine Use - Rarely. Medical History Hematologic/Lymphatic Patient has history of Anemia Cardiovascular Patient has history of Arrhythmia - A-Fib, Congestive Heart Failure, Hypertension Denies history of Angina Endocrine Patient has history of Type II Diabetes Immunological Patient has history of Lupus Erythematosus Medical A Surgical History Notes nd Endocrine Grave's disease Objective Constitutional respirations regular, non-labored and within  target range for patient.. Vitals Time Taken: 8:48 AM, Height: 63 in, Weight: 157 lbs, BMI: 27.8, Temperature: 99.1 F, Pulse: 80 bpm, Respiratory Rate: 20 breaths/min, Blood Pressure: 129/77 mmHg. Psychiatric pleasant and cooperative. General Notes: Right lower extremity: T the medial ankle and dorsum of the right foot she has open wounds with necrotic debris and some granulation tissue o present. Left lower extremity: T the left medial ankle she has a small open wound with nonviable tissue and granulation tissue present. T the left lateral ankle o o she has a large open wound with granulation tissue and nonviable tissue present. T the left dorsum of the foot she has significant necrotic debris. No obvious o signs of infection to all wound sites. Difficult to palpate pedal pulses. Improvement to the appearance since last clinic visit. Integumentary (Hair, Skin) Wound #1 status is Open. Original cause of wound was Gradually Appeared. The date acquired was: 04/03/2020. The wound has been in treatment 3 weeks. The wound is located on the Right,Medial Ankle. The wound measures 5cm length x 4cm width x 0.2cm depth; 15.708cm^2 area and 3.142cm^3 volume. There is Fat Layer (Subcutaneous Tissue) exposed. There is no tunneling or undermining noted. There is a medium amount of serosanguineous drainage noted. The wound margin is distinct with the outline attached to the wound base. There is medium (34-66%) red granulation within the wound bed. There is a medium (34-66%) amount of necrotic tissue within the wound bed including Adherent Slough. Wound #2 status is Open. Original cause of wound was Gradually Appeared. The date acquired was: 04/03/2020. The wound has been in treatment 3 weeks. The wound is located on the Right,Dorsal Foot. The wound measures 4.5cm length x 4.7cm width x 0.1cm depth; 16.611cm^2 area and 1.661cm^3 volume. There is Fat Layer (Subcutaneous Tissue) exposed. There is no tunneling or  undermining noted. There is a medium  amount of serosanguineous drainage noted. The wound margin is distinct with the outline attached to the wound base. There is medium (34-66%) red granulation within the wound bed. There is a medium (34- 66%) amount of necrotic tissue within the wound bed including Adherent Slough. Wound #3 status is Open. Original cause of wound was Gradually Appeared. The date acquired was: 02/22/2021. The wound has been in treatment 3 weeks. The wound is located on the Left,Medial Ankle. The wound measures 0.4cm length x 1.1cm width x 0.1cm depth; 0.346cm^2 area and 0.035cm^3 volume. There is Fat Layer (Subcutaneous Tissue) exposed. There is no tunneling or undermining noted. There is a medium amount of serosanguineous drainage noted. The wound margin is distinct with the outline attached to the wound base. There is small (1-33%) red granulation within the wound bed. There is a large (67-100%) amount of necrotic tissue within the wound bed including Eschar and Adherent Slough. Wound #4 status is Open. Original cause of wound was Gradually Appeared. The date acquired was: 04/03/2020. The wound has been in treatment 3 weeks. The wound is located on the Left,Lateral Ankle. The wound measures 8cm length x 5.5cm width x 0.1cm depth; 34.558cm^2 area and 3.456cm^3 volume. There is Fat Layer (Subcutaneous Tissue) exposed. There is no tunneling or undermining noted. There is a medium amount of serosanguineous drainage noted. The wound margin is distinct with the outline attached to the wound base. There is medium (34-66%) red granulation within the wound bed. There is a medium (34- 66%) amount of necrotic tissue within the wound bed including Adherent Slough. Wound #5 status is Open. Original cause of wound was Gradually Appeared. The date acquired was: 04/03/2020. The wound has been in treatment 3 weeks. The wound is located on the Left,Dorsal Foot. The wound measures 9.5cm length x 9.2cm  width x 0.2cm depth; 68.644cm^2 area and 13.729cm^3 volume. There is Fat Layer (Subcutaneous Tissue) exposed. There is no tunneling or undermining noted. There is a medium amount of serosanguineous drainage noted. The wound margin is distinct with the outline attached to the wound base. There is small (1-33%) red granulation within the wound bed. There is a large (67-100%) amount of necrotic tissue within the wound bed including Adherent Slough. Wound #6 status is Open. Original cause of wound was Gradually Appeared. The date acquired was: 04/03/2020. The wound has been in treatment 3 weeks. The wound is located on the Left,Medial Foot. The wound measures 0.3cm length x 0.4cm width x 0.1cm depth; 0.094cm^2 area and 0.009cm^3 volume. There is Fat Layer (Subcutaneous Tissue) exposed. There is no tunneling or undermining noted. There is a medium amount of serosanguineous drainage noted. The wound margin is distinct with the outline attached to the wound base. There is large (67-100%) red, pink granulation within the wound bed. There is no necrotic tissue within the wound bed. 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 I was able to debride a significant amount of nonviable tissue. 2 areas that are stuck on tightly that I had to crosshatch are the left dorsal foot and the right medial ankle. I recommended continuing Santyl to all wound beds. Instead of  silver alginate she can use ABD pads that can help with dressing changes.  No obvious signs of infection on exam however there is a mild odor. She obviously has an increased bioburden. We obtained a wound culture as she may benefit from topical antibiotics. Of note she has chronic pain to her feet and debridement is often not tolerable. She may benefit from debridement in the OR if she stalls with healing. For now we will continue with outpatient wound care. Procedures Wound #1 Pre-procedure diagnosis of Wound #1 is a Diabetic Wound/Ulcer of the Lower Extremity located on the Right,Medial Ankle .Severity of Tissue Pre Debridement is: Fat layer exposed. There was a Selective/Open Wound Non-Viable Tissue Debridement with a total area of 9 sq cm performed by Kalman Shan, DO. With the following instrument(s): Blade to remove Non-Viable tissue/material. Material removed includes [facility] after achieving pain control using Lidocaine 5% topical ointment. No specimens were taken. A time out was conducted at 09:19, prior to the start of the procedure. A Minimum amount of bleeding was controlled with Pressure. The procedure was tolerated well. Post Debridement Measurements: 5cm length x 4cm width x 0.2cm depth; 3.142cm^3 volume. Character of Wound/Ulcer Post Debridement is stable. Severity of Tissue Post Debridement is: Fat layer exposed. Post procedure Diagnosis Wound #1: Same as Pre-Procedure Wound #2 Pre-procedure diagnosis of Wound #2 is a Diabetic Wound/Ulcer of the Lower Extremity located on the Right,Dorsal Foot .Severity of Tissue Pre Debridement is: Fat layer exposed. There was a Selective/Open Wound Non-Viable Tissue Debridement with a total area of 16 sq cm performed by Kalman Shan, DO. With the following instrument(s): Curette to remove Non-Viable tissue/material. Material removed includes [facility] after achieving pain control using Lidocaine 5% topical ointment. No specimens were taken. A time out was conducted at 09:19, prior to the start of the procedure. A  Minimum amount of bleeding was controlled with Pressure. The procedure was tolerated well. Post Debridement Measurements: 4.5cm length x 4.7cm width x 0.1cm depth; 1.661cm^3 volume. Character of Wound/Ulcer Post Debridement is stable. Severity of Tissue Post Debridement is: Fat layer exposed. Post procedure Diagnosis Wound #2: Same as Pre-Procedure Wound #4 Pre-procedure diagnosis of Wound #4 is a Diabetic Wound/Ulcer of the Lower Extremity located on the Left,Lateral Ankle .Severity of Tissue Pre Debridement is: Fat layer exposed. There was a Selective/Open Wound Non-Viable Tissue Debridement with a total area of 35 sq cm performed by Kalman Shan, DO. With the following instrument(s): Curette to remove Non-Viable tissue/material. Material removed includes [facility] after achieving pain control using Lidocaine 5% topical ointment. No specimens were taken. A time out was conducted at 09:19, prior to the start of the procedure. A Minimum amount of bleeding was controlled with Pressure. The procedure was tolerated well. Post Debridement Measurements: 8cm length x 5.5cm width x 0.1cm depth; 3.456cm^3 volume. Character of Wound/Ulcer Post Debridement is stable. Severity of Tissue Post Debridement is: Fat layer exposed. Post procedure Diagnosis Wound #4: Same as Pre-Procedure Wound #5 Pre-procedure diagnosis of Wound #5 is a Diabetic Wound/Ulcer of the Lower Extremity located on the Left,Dorsal Foot .Severity of Tissue Pre Debridement is: Fat layer exposed. There was a Selective/Open Wound Non-Viable Tissue Debridement with a total area of 25 sq cm performed by Kalman Shan, DO. With the following instrument(s): Blade to remove Non-Viable tissue/material. Material removed includes [facility] after achieving pain control using Lidocaine 5% topical ointment. No specimens were taken. A time out was conducted at 09:19, prior to the start of the procedure. A Minimum amount  of bleeding was controlled with  Pressure. The procedure was tolerated well. Post Debridement Measurements: 9.5cm length x 9.2cm width x 0.2cm depth; 13.729cm^3 volume. Character of Wound/Ulcer Post Debridement is stable. Severity of Tissue Post Debridement is: Fat layer exposed. Post procedure Diagnosis Wound #5: Same as Pre-Procedure Plan Follow-up Appointments: Return Appointment in 1 week. - with Dr. Heber Radcliffe Other: - [facility]=Supplies Bathing/ Shower/ Hygiene: May shower and wash wound with soap and water. - when dressings changed Edema Control - Lymphedema / SCD / Other: Elevate legs to the level of the heart or above for 30 minutes daily and/or when sitting, a frequency of: Avoid standing for long periods of time. Additional Orders / Instructions: Follow Nutritious Diet - 100-120g of protein daily Home Health: Admit to [facility] for wound care. May utilize formulary equivalent dressing for wound treatment orders unless otherwise specified. - 02/22/21: Will submit order to [facility] agencies 03/18/21: Declined by [facility] The following medication(s) was prescribed: Santyl topical 250 unit/gram ointment Apply 1 application to wounds daily, ointment topical x 30days starting 03/18/2021 1. In office sharp debridement 2. Santyl 3. Follow-up in 1 week Electronic Signature(s) Signed: 03/18/2021 9:50:05 AM By: Kalman Shan DO Entered By: Kalman Shan on 03/18/2021 09:48:10 -------------------------------------------------------------------------------- HxROS Details Patient Name: Date of Service: [name], [name] 03/18/2021 8:45 A M Medical Record Number: [id number] Patient Account Number: [id number] Date of Birth/Sex: Treating RN: 11-16-1965 (55 y.o. Candace Robinson, Candace Robinson Primary Care Provider: [name] Other Clinician: Referring Provider: Treating Provider/Extender: Darcey Nora in Treatment: 3 Information Obtained From Patient Hematologic/Lymphatic Medical  History: Positive for: Anemia Cardiovascular Medical History: Positive for: Arrhythmia - A-Fib; Congestive Heart Failure; Hypertension Negative for: Angina Endocrine Medical History: Positive for: Type II Diabetes Past Medical History Notes: Grave's disease Time with diabetes: over 10 years Treated with: Diet Blood sugar tested every day: No Immunological Medical History: Positive for: Lupus Erythematosus Immunizations Pneumococcal Vaccine: Received Pneumococcal Vaccination: No Implantable Devices None Family and Social History Unknown History: Yes; Never smoker; Marital Status - Single; Alcohol Use: Never; Drug Use: No History; Caffeine Use: Rarely; Financial Concerns: No; Food, Clothing or Shelter Needs: No; Support System Lacking: No; Transportation Concerns: No Electronic Signature(s) Signed: 03/18/2021 9:50:05 AM By: Kalman Shan DO Signed: 03/18/2021 4:34:42 PM By: [name] RN Entered By: Kalman Shan on 03/18/2021 09:40:02 -------------------------------------------------------------------------------- SuperBill Details Patient Name: Date of Service: [name], [name] 03/18/2021 Medical Record Number: [id number] Patient Account Number: [id number] Date of Birth/Sex: Treating RN: 1966/01/30 (55 y.o. 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 I73.9 Peripheral vascular disease, unspecified E11.9 Type 2 diabetes mellitus without complications [id number] Other local lupus erythematosus [id number] Chronic diastolic (congestive) heart failure I10 Essential (primary) hypertension E11.621 Type 2  diabetes mellitus with foot ulcer Facility Procedures CPT4 Code: [id number] Description: [phone] - DEBRIDE WOUND 1ST 20 SQ CM OR < ICD-10 Diagnosis Description L97.519 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 L97.529 Non-pressure chronic ulcer of other part of left foot with unspecified  severity Modifier: Quantity: 1 CPT4 Code: [id number] Description: [id number] - DEBRIDE WOUND EA ADDL 20 SQ CM ICD-10 Diagnosis Description L97.519 Non-pressure chronic ulcer of other  part of right foot with unspecified severity L97.529 Non-pressure chronic ulcer of other part of left foot with unspecified  severity Modifier: Quantity: 4 Physician Procedures : CPT4 Code Description Modifier [id number] - WC PHYS LEVEL 3 - EST PT ICD-10 Diagnosis Description L97.519 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 L97.529 Non-pressure chronic ulcer of other part of left foot  with unspecified severity E11.621 Type 2 diabetes mellitus with foot ulcer L93.2 Other local lupus erythematosus Quantity: 1 : [id number] - WC PHYS DEBR WO ANESTH 20 SQ CM 1 ICD-10 Diagnosis Description L97.519 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

## 2021-03-18 NOTE — Progress Notes (Signed)
JEIMY, ZEISET ([id number]) Visit Report for 03/18/2021 Arrival Information Details Patient Name: Date of Service: Candace Robinson, Candace RO L L. 03/18/2021 8:45 A M Medical Record Number: [id number] Patient Account Number: [id number] Date of Birth/Sex: Treating RN: 1965-08-03 (55 y.o. Candace Robinson Primary Care Ronak Duquette: [name] Other Clinician: Referring Diamante Truszkowski: Treating Kirt Chew/Extender: Darcey Nora in Treatment: 3 Visit Information History Since Last Visit Added or deleted any medications: No Patient Arrived: [name] Any new allergies or adverse reactions: No Arrival Time: 08:42 Had a fall or experienced change in No Transfer Assistance: None activities of daily living that may affect Patient Identification Verified: Yes risk of falls: Secondary Verification Process Completed: Yes Signs or symptoms of abuse/neglect since last visito No Patient Requires Transmission-Based No Hospitalized since last visit: No Precautions: Implantable device outside of the clinic excluding No Patient Has Alerts: Yes cellular tissue based products placed in the center Patient Alerts: R ABI: 1.0 L ABI: 1.1 since last visit: NO BENZOCAINE Has Dressing in Place as Prescribed: Yes USE LIDOCAINE GEL Has Compression in Place as Prescribed: Yes ONLY Pain Present Now: Yes Electronic Signature(s) Signed: 03/18/2021 5:57:57 PM By: Lorrin Jackson Entered By: Lorrin Jackson on 03/18/2021 08:48:06 -------------------------------------------------------------------------------- Encounter Discharge Information Details Patient Name: Date of Service: Candace Robinson, Candace RO L L. 03/18/2021 8:45 A M Medical Record Number: [id number] Patient Account Number: [id number] Date of Birth/Sex: Treating RN: 09/05/1965 (55 y.o. Candace Robinson Primary Care Alayjah Boehringer: [name] Other Clinician: Referring Tiron Suski: Treating Asani Mcburney/Extender: Darcey Nora in Treatment: 3 Encounter Discharge Information Items Post Procedure Vitals Discharge Condition: Stable Temperature (F): 99.1 Ambulatory Status: Walker Pulse (bpm): 80 Discharge Destination: Home Respiratory Rate (breaths/min): 20 Transportation: Private Auto Blood Pressure (mmHg): 129/77 Schedule Follow-up Appointment: Yes Clinical Summary of Care: Provided on 03/18/2021 Form Type Recipient Paper Patient Patient Electronic Signature(s) Signed: 03/18/2021 5:57:57 PM By: Lorrin Jackson Entered By: Lorrin Jackson on 03/18/2021 09:35:45 -------------------------------------------------------------------------------- Lower Extremity Assessment Details Patient Name: Date of Service: Candace Robinson, Candace RO L L. 03/18/2021 8:45 A M Medical Record Number: [id number] Patient Account Number: [id number] Date of Birth/Sex: Treating RN: Feb 21, 1966 (55 y.o. Candace Robinson Primary Care Valla Pacey: [name] Other Clinician: Referring Misha Antonini: Treating Luciel Brickman/Extender: [name] Weeks in Treatment: 3 Edema Assessment Assessed: [name]: Yes] [Right: Yes] Edema: [Left: Yes] [Right: Yes] Calf Left: Right: Point of Measurement: 29 cm From Medial Instep 34 cm 33.5 cm Ankle Left: Right: Point of Measurement: 10 cm From Medial Instep 24 cm 23 cm Vascular Assessment Pulses: Dorsalis Pedis Palpable: [Left:Yes] [Right:Yes] Electronic Signature(s) Signed: 03/18/2021 5:57:57 PM By: Lorrin Jackson Entered By: Lorrin Jackson on 03/18/2021 08:57:24 -------------------------------------------------------------------------------- Multi Wound Chart Details Patient Name: Date of Service: Candace Robinson, Candace RO L L. 03/18/2021 8:45 A M Medical Record Number: [id number] Patient Account Number: [id number] Date of Birth/Sex: Treating RN: Oct 30, 1965 (55 y.o. Candace Robinson Primary Care Brentney Goldbach: [name] Other Clinician: Referring Gerrod Maule: Treating  Sebrena Engh/Extender: [name] Weeks in Treatment: 3 Vital Signs Height(in): 56 Pulse(bpm): 80 Weight(lbs): 157 Blood Pressure(mmHg): 129/77 Body Mass Index(BMI): 28 Temperature(F): 99.1 Respiratory Rate(breaths/min): 20 Photos: [1:Right, Medial Ankle] [2:Right, Dorsal Foot] [3:Left, Medial Ankle] Wound Location: [1:Gradually Appeared] [2:Gradually Appeared] [3:Gradually Appeared] Wounding Event: [1:Diabetic Wound/Ulcer of the Lower] [2:Diabetic Wound/Ulcer of the Lower] [3:Diabetic Wound/Ulcer of the Lower] Primary Etiology: [1:Extremity Anemia, Arrhythmia, Congestive Heart Anemia, Arrhythmia, Congestive Heart Anemia, Arrhythmia, Congestive Heart] [2:Extremity] [3:Extremity] Comorbid History: [1:Failure,  Hypertension, Type II Diabetes, Lupus Erythematosus 04/03/2020] [2:Failure, Hypertension, Type II Diabetes, Lupus Erythematosus 04/03/2020] [3:Failure, Hypertension, Type II Diabetes, Lupus Erythematosus 02/22/2021] Date Acquired: [1:3] [2:3] [3:3] Weeks of Treatment: [1:Open] [2:Open] [3:Open] Wound Status: [1:5x4x0.2] [2:4.5x4.7x0.1] [3:0.4x1.1x0.1] Measurements [name] [name] D (cm) [1:15.708] [2:16.611] [3:0.346] A (cm) : rea [1:3.142] [2:1.661] [3:0.035] Volume (cm) : [1:4.80%] [2:-41.00%] [3:82.40%] % Reduction in A [1:rea: 4.80%] [2:-41.00%] [3:91.10%] % Reduction in Volume: [1:Grade 2] [2:Grade 2] [3:Grade 2] Classification: [1:Medium] [2:Medium] [3:Medium] Exudate A mount: [1:Serosanguineous] [2:Serosanguineous] [3:Serosanguineous] Exudate Type: [1:red, brown] [2:red, brown] [3:red, brown] Exudate Color: [1:Distinct, outline attached] [2:Distinct, outline attached] [3:Distinct, outline attached] Wound Margin: [1:Medium (34-66%)] [2:Medium (34-66%)] [3:Small (1-33%)] Granulation A mount: [1:Red] [2:Red] [3:Red] Granulation Quality: [1:Medium (34-66%)] [2:Medium (34-66%)] [3:Large (67-100%)] Necrotic A mount: [1:Adherent Slough] [2:Adherent Slough] [3:Eschar,  Adherent Slough] Necrotic Tissue: [1:Fat Layer (Subcutaneous Tissue): Yes Fat Layer (Subcutaneous Tissue): Yes Fat Layer (Subcutaneous Tissue): Yes] Exposed Structures: [1:Fascia: No Tendon: No Muscle: No Joint: No Bone: No None] [2:Fascia: No Tendon: No Muscle: No Joint: No Bone: No None] [3:Fascia: No Tendon: No Muscle: No Joint: No Bone: No Large (67-100%)] Epithelialization: [1:Debridement - Selective/Open Wound Debridement - Selective/Open Wound N/A] Debridement: Pre-procedure Verification/Time Out 09:19 [2:09:19] [3:N/A] Taken: [1:Lidocaine 5% topical ointment] [2:Lidocaine 5% topical ointment] [3:N/A] Pain Control: [1:Slough] [2:Slough] [3:N/A] Tissue Debrided: [1:Non-Viable Tissue] [2:Non-Viable Tissue] [3:N/A] Level: [1:9] [2:16] [3:N/A] Debridement A (sq cm): [1:rea Blade] [2:Curette] [3:N/A] Instrument: [1:Minimum] [2:Minimum] [3:N/A] Bleeding: [1:Pressure] [2:Pressure] [3:N/A] Hemostasis A chieved: [1:Procedure was tolerated well] [2:Procedure was tolerated well] [3:N/A] Debridement Treatment Response: [1:5x4x0.2] [2:4.5x4.7x0.1] [3:N/A] Post Debridement Measurements [name] [name] D (cm) [1:3.142] [2:1.661] [3:N/A] Post Debridement Volume: (cm) [1:Debridement] [2:Debridement] [3:N/A] Wound Number: '4 5 6 '$ Photos: Left, Lateral Ankle Left, Dorsal Foot Left, Medial Foot Wound Location: Gradually Appeared Gradually Appeared Gradually Appeared Wounding Event: Diabetic Wound/Ulcer of the Lower Diabetic Wound/Ulcer of the Lower Diabetic Wound/Ulcer of the Lower Primary Etiology: Extremity Extremity Extremity Anemia, Arrhythmia, Congestive Heart Anemia, Arrhythmia, Congestive Heart Anemia, Arrhythmia, Congestive Heart Comorbid History: Failure, Hypertension, Type II Failure, Hypertension, Type II Failure, Hypertension, Type II Diabetes, Lupus Erythematosus Diabetes, Lupus Erythematosus Diabetes, Lupus Erythematosus 04/03/2020 04/03/2020 04/03/2020 Date Acquired: '3 3 3 '$ Weeks of  Treatment: Open Open Open Wound Status: 8x5.5x0.1 9.5x9.2x0.2 0.3x0.4x0.1 Measurements [name] [name] D (cm) 34.558 68.644 0.094 A (cm) : rea 3.456 13.729 0.009 Volume (cm) : 8.30% -14.20% 70.10% % Reduction in A rea: 54.20% -128.50% 71.00% % Reduction in Volume: Grade 2 Unable to visualize wound bed Grade 2 Classification: Medium Medium Medium Exudate A mount: Serosanguineous Serosanguineous Serosanguineous Exudate Type: red, brown red, brown red, brown Exudate Color: Distinct, outline attached Distinct, outline attached Distinct, outline attached Wound Margin: Medium (34-66%) Small (1-33%) Large (67-100%) Granulation A mount: Red Red Red, Pink Granulation Quality: Medium (34-66%) Large (67-100%) None Present (0%) Necrotic A mount: Adherent Slough Adherent Slough N/A Necrotic Tissue: Fat Layer (Subcutaneous Tissue): Yes Fat Layer (Subcutaneous Tissue): Yes Fat Layer (Subcutaneous Tissue): Yes Exposed Structures: Fascia: No Fascia: No Fascia: No Tendon: No Tendon: No Tendon: No Muscle: No Muscle: No Muscle: No Joint: No Joint: No Joint: No Bone: No Bone: No Bone: No None None Large (67-100%) Epithelialization: Debridement - Selective/Open Wound Debridement - Selective/Open Wound N/A Debridement: Pre-procedure Verification/Time Out 09:19 09:19 N/A Taken: Lidocaine 5% topical ointment Lidocaine 5% topical ointment N/A Pain Control: [facility] N/A Tissue Debrided: Non-Viable Tissue Non-Viable Tissue N/A Level: 35 25 N/A Debridement A (sq cm): rea Curette Blade N/A  Instrument: Minimum Minimum N/A Bleeding: Pressure Pressure N/A Hemostasis A chieved: Procedure was tolerated well Procedure was tolerated well N/A Debridement Treatment Response: 8x5.5x0.1 9.5x9.2x0.2 N/A Post Debridement Measurements [name] [name] D (cm) 3.456 13.729 N/A Post Debridement Volume: (cm) Debridement Debridement N/A Procedures Performed: Treatment Notes Wound #1 (Ankle)  Wound Laterality: Right, Medial Cleanser Soap and Water Discharge Instruction: May shower and wash wound with dial antibacterial soap and water prior to dressing change. Wound Cleanser Discharge Instruction: Cleanse the wound with wound cleanser prior to applying a clean dressing using gauze sponges, not tissue or cotton balls. Peri-Wound Care Topical Primary Dressing Santyl Ointment Discharge Instruction: Apply nickel thick amount to wound bed as instructed Secondary Dressing ABD Pad, 8x10 Discharge Instruction: Apply over primary dressing as directed. Secured With Elastic Bandage 4 inch (ACE bandage) Discharge Instruction: Secure with ACE bandage as directed. Kerlix Roll Sterile, 4.5x3.1 (in/yd) Discharge Instruction: Secure with Kerlix as directed. Transpore Surgical Tape, 2x10 (in/yd) Discharge Instruction: Secure dressing with tape as directed. Compression Wrap Compression Stockings Add-Ons Wound #2 (Foot) Wound Laterality: Dorsal, Right Cleanser Soap and Water Discharge Instruction: May shower and wash wound with dial antibacterial soap and water prior to dressing change. Wound Cleanser Discharge Instruction: Cleanse the wound with wound cleanser prior to applying a clean dressing using gauze sponges, not tissue or cotton balls. Peri-Wound Care Topical Primary Dressing Santyl Ointment Discharge Instruction: Apply nickel thick amount to wound bed as instructed Secondary Dressing ABD Pad, 8x10 Discharge Instruction: Apply over primary dressing as directed. Secured With Elastic Bandage 4 inch (ACE bandage) Discharge Instruction: Secure with ACE bandage as directed. Kerlix Roll Sterile, 4.5x3.1 (in/yd) Discharge Instruction: Secure with Kerlix as directed. Transpore Surgical Tape, 2x10 (in/yd) Discharge Instruction: Secure dressing with tape as directed. Compression Wrap Compression Stockings Add-Ons Wound #3 (Ankle) Wound Laterality: Left, Medial Cleanser Soap and  Water Discharge Instruction: May shower and wash wound with dial antibacterial soap and water prior to dressing change. Wound Cleanser Discharge Instruction: Cleanse the wound with wound cleanser prior to applying a clean dressing using gauze sponges, not tissue or cotton balls. Peri-Wound Care Topical Primary Dressing Santyl Ointment Discharge Instruction: Apply nickel thick amount to wound bed as instructed Secondary Dressing ABD Pad, 8x10 Discharge Instruction: Apply over primary dressing as directed. Secured With Elastic Bandage 4 inch (ACE bandage) Discharge Instruction: Secure with ACE bandage as directed. Kerlix Roll Sterile, 4.5x3.1 (in/yd) Discharge Instruction: Secure with Kerlix as directed. Transpore Surgical Tape, 2x10 (in/yd) Discharge Instruction: Secure dressing with tape as directed. Compression Wrap Compression Stockings Add-Ons Wound #4 (Ankle) Wound Laterality: Left, Lateral Cleanser Soap and Water Discharge Instruction: May shower and wash wound with dial antibacterial soap and water prior to dressing change. Wound Cleanser Discharge Instruction: Cleanse the wound with wound cleanser prior to applying a clean dressing using gauze sponges, not tissue or cotton balls. Peri-Wound Care Topical Primary Dressing Santyl Ointment Discharge Instruction: Apply nickel thick amount to wound bed as instructed Secondary Dressing ABD Pad, 8x10 Discharge Instruction: Apply over primary dressing as directed. Secured With Elastic Bandage 4 inch (ACE bandage) Discharge Instruction: Secure with ACE bandage as directed. Kerlix Roll Sterile, 4.5x3.1 (in/yd) Discharge Instruction: Secure with Kerlix as directed. Transpore Surgical Tape, 2x10 (in/yd) Discharge Instruction: Secure dressing with tape as directed. Compression Wrap Compression Stockings Add-Ons Wound #5 (Foot) Wound Laterality: Dorsal, Left Cleanser Soap and Water Discharge Instruction: May shower and wash  wound with dial antibacterial soap and water prior to dressing change. Wound Cleanser Discharge Instruction:  Cleanse the wound with wound cleanser prior to applying a clean dressing using gauze sponges, not tissue or cotton balls. Peri-Wound Care Topical Primary Dressing Santyl Ointment Discharge Instruction: Apply nickel thick amount to wound bed as instructed Secondary Dressing ABD Pad, 8x10 Discharge Instruction: Apply over primary dressing as directed. Secured With Elastic Bandage 4 inch (ACE bandage) Discharge Instruction: Secure with ACE bandage as directed. Kerlix Roll Sterile, 4.5x3.1 (in/yd) Discharge Instruction: Secure with Kerlix as directed. Transpore Surgical Tape, 2x10 (in/yd) Discharge Instruction: Secure dressing with tape as directed. Compression Wrap Compression Stockings Add-Ons Wound #6 (Foot) Wound Laterality: Left, Medial Cleanser Soap and Water Discharge Instruction: May shower and wash wound with dial antibacterial soap and water prior to dressing change. Wound Cleanser Discharge Instruction: Cleanse the wound with wound cleanser prior to applying a clean dressing using gauze sponges, not tissue or cotton balls. Peri-Wound Care Topical Primary Dressing Santyl Ointment Discharge Instruction: Apply nickel thick amount to wound bed as instructed Secondary Dressing ABD Pad, 8x10 Discharge Instruction: Apply over primary dressing as directed. Secured With Elastic Bandage 4 inch (ACE bandage) Discharge Instruction: Secure with ACE bandage as directed. Kerlix Roll Sterile, 4.5x3.1 (in/yd) Discharge Instruction: Secure with Kerlix as directed. Transpore Surgical Tape, 2x10 (in/yd) Discharge Instruction: Secure dressing with tape as directed. Compression Wrap Compression Stockings Add-Ons Electronic Signature(s) Signed: 03/18/2021 9:50:05 AM By: Kalman Shan DO Signed: 03/18/2021 5:57:57 PM By: Lorrin Jackson Entered By: Kalman Shan on 03/18/2021  09:36:23 -------------------------------------------------------------------------------- Multi-Disciplinary Care Plan Details Patient Name: Date of Service: Candace Robinson, Candace RO L L. 03/18/2021 8:45 A M Medical Record Number: [id number] Patient Account Number: [id number] Date of Birth/Sex: Treating RN: 1965/07/24 (55 y.o. Candace Robinson Primary Care Kipp Shank: [name] Other Clinician: Referring Suha Schoenbeck: Treating Chizara Mena/Extender: Darcey Nora in Treatment: 3 Active Inactive Tissue Oxygenation Nursing Diagnoses: Actual ineffective tissue perfusion; peripheral (select once diagnosis is confirmed) Goals: Patient/caregiver will verbalize understanding of disease process and disease management Date Initiated: 02/22/2021 Target Resolution Date: 03/29/2021 Goal Status: Active Interventions: Assess patient understanding of disease process and management upon diagnosis and as needed Assess peripheral arterial status upon admission and as needed Provide education on tissue oxygenation and ischemia Treatment Activities: T ordered outside of clinic : 02/22/2021 est Notes: Wound/Skin Impairment Nursing Diagnoses: Impaired tissue integrity Goals: Patient/caregiver will verbalize understanding of skin care regimen Date Initiated: 02/22/2021 Target Resolution Date: 03/22/2021 Goal Status: Active Ulcer/skin breakdown will have a volume reduction of 30% by week 4 Date Initiated: 02/22/2021 Target Resolution Date: 03/22/2021 Goal Status: Active Interventions: Assess patient/caregiver ability to obtain necessary supplies Assess patient/caregiver ability to perform ulcer/skin care regimen upon admission and as needed Assess ulceration(s) every visit Provide education on ulcer and skin care Treatment Activities: Topical wound management initiated : 02/22/2021 Notes: Electronic Signature(s) Signed: 03/18/2021 5:57:57 PM By: Lorrin Jackson Entered By:  Lorrin Jackson on 03/18/2021 09:01:16 -------------------------------------------------------------------------------- Pain Assessment Details Patient Name: Date of Service: Candace Robinson, Candace RO L L. 03/18/2021 8:45 A M Medical Record Number: [id number] Patient Account Number: [id number] Date of Birth/Sex: Treating RN: 1966/03/13 (55 y.o. Candace Robinson Primary Care Koen Antilla: [name] Other Clinician: Referring Edison Nicholson: Treating Trayvon Trumbull/Extender: [name] Weeks in Treatment: 3 Active Problems Location of Pain Severity and Description of Pain Patient Has Paino Yes Site Locations Pain Location: Pain in Ulcers With Dressing Change: Yes Duration of the Pain. Constant / Intermittento Intermittent Rate the pain. Current Pain Level: 6 Character of Pain Describe the Pain:  Aching, Throbbing Pain Management and Medication Current Pain Management: Medication: Yes Cold Application: No Rest: Yes Massage: No Activity: No T.E.N.S.: No Heat Application: No Leg drop or elevation: No Is the Current Pain Management Adequate: Adequate How does your wound impact your activities of daily livingo Sleep: No Bathing: No Appetite: No Relationship With Others: No Bladder Continence: No Emotions: No Bowel Continence: No Work: No Toileting: No Drive: No Dressing: No Hobbies: No Electronic Signature(s) Signed: 03/18/2021 5:57:57 PM By: Lorrin Jackson Entered By: Lorrin Jackson on 03/18/2021 08:49:09 -------------------------------------------------------------------------------- Patient/Caregiver Education Details Patient Name: Date of Service: Candace Robinson, Candace RO L L. 9/15/2022andnbsp8:45 A M Medical Record Number: [id number] Patient Account Number: [id number] Date of Birth/Gender: Treating RN: [birth year] (55 y.o. Candace Robinson Primary Care Physician: [name] Other Clinician: Referring Physician: Treating Physician/Extender: Darcey Nora in Treatment: 3 Education Assessment Education Provided To: Patient Education Topics Provided Tissue Oxygenation: Methods: Explain/Verbal Responses: State content correctly Wound/Skin Impairment: Methods: Explain/Verbal, Printed Responses: State content correctly Electronic Signature(s) Signed: 03/18/2021 5:57:57 PM By: Lorrin Jackson Entered By: Lorrin Jackson on 03/18/2021 09:01:38 -------------------------------------------------------------------------------- Wound Assessment Details Patient Name: Date of Service: Candace Robinson, Candace RO L L. 03/18/2021 8:45 A M Medical Record Number: [id number] Patient Account Number: [id number] Date of Birth/Sex: Treating RN: [birth year] (55 y.o. Candace Robinson Primary Care Add Dinapoli: [name] Other Clinician: Referring Leasia Swann: Treating Davarius Ridener/Extender: [name] Weeks in Treatment: 3 Wound Status Wound Number: 1 Primary Diabetic Wound/Ulcer of the Lower Extremity Etiology: Wound Location: Right, Medial Ankle Wound Open Wounding Event: Gradually Appeared Status: Date Acquired: 04/03/2020 Comorbid Anemia, Arrhythmia, Congestive Heart Failure, Hypertension, Weeks Of Treatment: 3 History: Type II Diabetes, Lupus Erythematosus Clustered Wound: No Photos Wound Measurements Length: (cm) 5 Width: (cm) 4 Depth: (cm) 0.2 Area: (cm) 15.708 Volume: (cm) 3.142 % Reduction in Area: 4.8% % Reduction in Volume: 4.8% Epithelialization: None Tunneling: No Undermining: No Wound Description Classification: Grade 2 Wound Margin: Distinct, outline attached Exudate Amount: Medium Exudate Type: Serosanguineous Exudate Color: red, brown Foul Odor After Cleansing: No Slough/Fibrino Yes Wound Bed Granulation Amount: Medium (34-66%) Exposed Structure Granulation Quality: Red Fascia Exposed: No Necrotic Amount: Medium (34-66%) Fat Layer (Subcutaneous Tissue) Exposed:  Yes Necrotic Quality: Adherent Slough Tendon Exposed: No Muscle Exposed: No Joint Exposed: No Bone Exposed: No Treatment Notes Wound #1 (Ankle) Wound Laterality: Right, Medial Cleanser Soap and Water Discharge Instruction: May shower and wash wound with dial antibacterial soap and water prior to dressing change. Wound Cleanser Discharge Instruction: Cleanse the wound with wound cleanser prior to applying a clean dressing using gauze sponges, not tissue or cotton balls. Peri-Wound Care Topical Primary Dressing Santyl Ointment Discharge Instruction: Apply nickel thick amount to wound bed as instructed Secondary Dressing ABD Pad, 8x10 Discharge Instruction: Apply over primary dressing as directed. Secured With Elastic Bandage 4 inch (ACE bandage) Discharge Instruction: Secure with ACE bandage as directed. Kerlix Roll Sterile, 4.5x3.1 (in/yd) Discharge Instruction: Secure with Kerlix as directed. Transpore Surgical Tape, 2x10 (in/yd) Discharge Instruction: Secure dressing with tape as directed. Compression Wrap Compression Stockings Add-Ons Electronic Signature(s) Signed: 03/18/2021 5:57:57 PM By: Lorrin Jackson Entered By: Lorrin Jackson on 03/18/2021 09:06:46 -------------------------------------------------------------------------------- Wound Assessment Details Patient Name: Date of Service: Candace Robinson, Candace RO L L. 03/18/2021 8:45 A M Medical Record Number: [id number] Patient Account Number: [id number] Date of Birth/Sex: Treating RN: September 12, 1965 (55 y.o. Candace Robinson Primary Care Mcclain Shall: [name] Other Clinician: Referring Mayte Diers: Treating Quavion Boule/Extender: Heber   Noreene Larsson, Nathaneil Canary Weeks in Treatment: 3 Wound Status Wound Number: 2 Primary Diabetic Wound/Ulcer of the Lower Extremity Etiology: Etiology: Wound Location: Right, Dorsal Foot Wound Open Wounding Event: Gradually Appeared Status: Date Acquired: 04/03/2020 Comorbid Anemia,  Arrhythmia, Congestive Heart Failure, Hypertension, Weeks Of Treatment: 3 History: Type II Diabetes, Lupus Erythematosus Clustered Wound: No Photos Wound Measurements Length: (cm) 4.5 Width: (cm) 4.7 Depth: (cm) 0.1 Area: (cm) 16.611 Volume: (cm) 1.661 % Reduction in Area: -41% % Reduction in Volume: -41% Epithelialization: None Tunneling: No Undermining: No Wound Description Classification: Grade 2 Wound Margin: Distinct, outline attached Exudate Amount: Medium Exudate Type: Serosanguineous Exudate Color: red, brown Foul Odor After Cleansing: No Slough/Fibrino Yes Wound Bed Granulation Amount: Medium (34-66%) Exposed Structure Granulation Quality: Red Fascia Exposed: No Necrotic Amount: Medium (34-66%) Fat Layer (Subcutaneous Tissue) Exposed: Yes Necrotic Quality: Adherent Slough Tendon Exposed: No Muscle Exposed: No Joint Exposed: No Bone Exposed: No Treatment Notes Wound #2 (Foot) Wound Laterality: Dorsal, Right Cleanser Soap and Water Discharge Instruction: May shower and wash wound with dial antibacterial soap and water prior to dressing change. Wound Cleanser Discharge Instruction: Cleanse the wound with wound cleanser prior to applying a clean dressing using gauze sponges, not tissue or cotton balls. Peri-Wound Care Topical Primary Dressing Santyl Ointment Discharge Instruction: Apply nickel thick amount to wound bed as instructed Secondary Dressing ABD Pad, 8x10 Discharge Instruction: Apply over primary dressing as directed. Secured With Elastic Bandage 4 inch (ACE bandage) Discharge Instruction: Secure with ACE bandage as directed. Kerlix Roll Sterile, 4.5x3.1 (in/yd) Discharge Instruction: Secure with Kerlix as directed. Transpore Surgical Tape, 2x10 (in/yd) Discharge Instruction: Secure dressing with tape as directed. Compression Wrap Compression Stockings Add-Ons Electronic Signature(s) Signed: 03/18/2021 5:57:57 PM By: Lorrin Jackson Entered  By: Lorrin Jackson on 03/18/2021 09:07:20 -------------------------------------------------------------------------------- Wound Assessment Details Patient Name: Date of Service: Candace Robinson, Candace RO L L. 03/18/2021 8:45 A M Medical Record Number: [id number] Patient Account Number: [id number] Date of Birth/Sex: Treating RN: Jun 27, 1966 (55 y.o. Candace Robinson Primary Care Jaely Silman: [name] Other Clinician: Referring Amaria Mundorf: Treating Kynan Peasley/Extender: [name] Weeks in Treatment: 3 Wound Status Wound Number: 3 Primary Diabetic Wound/Ulcer of the Lower Extremity Etiology: Wound Location: Left, Medial Ankle Wound Open Wounding Event: Gradually Appeared Status: Date Acquired: 02/22/2021 Comorbid Anemia, Arrhythmia, Congestive Heart Failure, Hypertension, Weeks Of Treatment: 3 History: Type II Diabetes, Lupus Erythematosus Clustered Wound: No Photos Wound Measurements Length: (cm) 0.4 Width: (cm) 1.1 Depth: (cm) 0.1 Area: (cm) 0.346 Volume: (cm) 0.035 % Reduction in Area: 82.4% % Reduction in Volume: 91.1% Epithelialization: Large (67-100%) Tunneling: No Undermining: No Wound Description Classification: Grade 2 Wound Margin: Distinct, outline attached Exudate Amount: Medium Exudate Type: Serosanguineous Exudate Color: red, brown Foul Odor After Cleansing: No Slough/Fibrino Yes Wound Bed Granulation Amount: Small (1-33%) Exposed Structure Granulation Quality: Red Fascia Exposed: No Necrotic Amount: Large (67-100%) Fat Layer (Subcutaneous Tissue) Exposed: Yes Necrotic Quality: Eschar, Adherent Slough Tendon Exposed: No Muscle Exposed: No Joint Exposed: No Bone Exposed: No Treatment Notes Wound #3 (Ankle) Wound Laterality: Left, Medial Cleanser Soap and Water Discharge Instruction: May shower and wash wound with dial antibacterial soap and water prior to dressing change. Wound Cleanser Discharge Instruction: Cleanse the  wound with wound cleanser prior to applying a clean dressing using gauze sponges, not tissue or cotton balls. Peri-Wound Care Topical Primary Dressing Santyl Ointment Discharge Instruction: Apply nickel thick amount to wound bed as instructed Secondary Dressing ABD Pad, 8x10 Discharge Instruction: Apply over primary dressing  as directed. Secured With Elastic Bandage 4 inch (ACE bandage) Discharge Instruction: Secure with ACE bandage as directed. Kerlix Roll Sterile, 4.5x3.1 (in/yd) Discharge Instruction: Secure with Kerlix as directed. Transpore Surgical Tape, 2x10 (in/yd) Discharge Instruction: Secure dressing with tape as directed. Compression Wrap Compression Stockings Add-Ons Electronic Signature(s) Signed: 03/18/2021 5:57:57 PM By: Lorrin Jackson Entered By: Lorrin Jackson on 03/18/2021 09:08:10 -------------------------------------------------------------------------------- Wound Assessment Details Patient Name: Date of Service: Candace Robinson, Candace RO L L. 03/18/2021 8:45 A M Medical Record Number: [id number] Patient Account Number: [id number] Date of Birth/Sex: Treating RN: 1966/01/15 (55 y.o. Candace Robinson Primary Care Roshni Burbano: [name] Other Clinician: Referring Katharine Rochefort: Treating Tyee Vandevoorde/Extender: [name] Weeks in Treatment: 3 Wound Status Wound Number: 4 Primary Diabetic Wound/Ulcer of the Lower Extremity Etiology: Wound Location: Left, Lateral Ankle Wound Open Wounding Event: Gradually Appeared Status: Date Acquired: 04/03/2020 Comorbid Anemia, Arrhythmia, Congestive Heart Failure, Hypertension, Weeks Of Treatment: 3 History: Type II Diabetes, Lupus Erythematosus Clustered Wound: No Photos Wound Measurements Length: (cm) 8 Width: (cm) 5.5 Depth: (cm) 0.1 Area: (cm) 34.558 Volume: (cm) 3.456 % Reduction in Area: 8.3% % Reduction in Volume: 54.2% Epithelialization: None Tunneling: No Undermining: No Wound  Description Classification: Grade 2 Wound Margin: Distinct, outline attached Exudate Amount: Medium Exudate Type: Serosanguineous Exudate Color: red, brown Foul Odor After Cleansing: No Slough/Fibrino Yes Wound Bed Granulation Amount: Medium (34-66%) Exposed Structure Granulation Quality: Red Fascia Exposed: No Necrotic Amount: Medium (34-66%) Fat Layer (Subcutaneous Tissue) Exposed: Yes Necrotic Quality: Adherent Slough Tendon Exposed: No Muscle Exposed: No Joint Exposed: No Bone Exposed: No Treatment Notes Wound #4 (Ankle) Wound Laterality: Left, Lateral Cleanser Soap and Water Discharge Instruction: May shower and wash wound with dial antibacterial soap and water prior to dressing change. Wound Cleanser Discharge Instruction: Cleanse the wound with wound cleanser prior to applying a clean dressing using gauze sponges, not tissue or cotton balls. Peri-Wound Care Topical Primary Dressing Santyl Ointment Discharge Instruction: Apply nickel thick amount to wound bed as instructed Secondary Dressing ABD Pad, 8x10 Discharge Instruction: Apply over primary dressing as directed. Secured With Elastic Bandage 4 inch (ACE bandage) Discharge Instruction: Secure with ACE bandage as directed. Kerlix Roll Sterile, 4.5x3.1 (in/yd) Discharge Instruction: Secure with Kerlix as directed. Transpore Surgical Tape, 2x10 (in/yd) Discharge Instruction: Secure dressing with tape as directed. Compression Wrap Compression Stockings Add-Ons Electronic Signature(s) Signed: 03/18/2021 5:57:57 PM By: Lorrin Jackson Signed: 03/18/2021 5:57:57 PM By: Lorrin Jackson Entered By: Lorrin Jackson on 03/18/2021 09:08:52 -------------------------------------------------------------------------------- Wound Assessment Details Patient Name: Date of Service: Candace Robinson, Candace RO L L. 03/18/2021 8:45 A M Medical Record Number: [id number] Patient Account Number: [id number] Date of Birth/Sex: Treating  RN: 05-Jun-1966 (55 y.o. Candace Robinson Primary Care Chandler Swiderski: [name] Other Clinician: Referring Courtlyn Aki: Treating Rosann Gorum/Extender: [name] Weeks in Treatment: 3 Wound Status Wound Number: 5 Primary Diabetic Wound/Ulcer of the Lower Extremity Etiology: Wound Location: Left, Dorsal Foot Wound Open Wounding Event: Gradually Appeared Status: Date Acquired: 04/03/2020 Comorbid Anemia, Arrhythmia, Congestive Heart Failure, Hypertension, Weeks Of Treatment: 3 History: Type II Diabetes, Lupus Erythematosus Clustered Wound: No Photos Wound Measurements Length: (cm) 9.5 Width: (cm) 9.2 Depth: (cm) 0.2 Area: (cm) 68.644 Volume: (cm) 13.729 % Reduction in Area: -14.2% % Reduction in Volume: -128.5% Epithelialization: None Tunneling: No Undermining: No Wound Description Classification: Unable to visualize wound bed Wound Margin: Distinct, outline attached Exudate Amount: Medium Exudate Type: Serosanguineous Exudate Color: red, brown Foul Odor After Cleansing: No Slough/Fibrino Yes Wound  Bed Granulation Amount: Small (1-33%) Exposed Structure Granulation Quality: Red Fascia Exposed: No Necrotic Amount: Large (67-100%) Fat Layer (Subcutaneous Tissue) Exposed: Yes Necrotic Quality: Adherent Slough Tendon Exposed: No Muscle Exposed: No Joint Exposed: No Bone Exposed: No Treatment Notes Wound #5 (Foot) Wound Laterality: Dorsal, Left Cleanser Soap and Water Discharge Instruction: May shower and wash wound with dial antibacterial soap and water prior to dressing change. Wound Cleanser Discharge Instruction: Cleanse the wound with wound cleanser prior to applying a clean dressing using gauze sponges, not tissue or cotton balls. Peri-Wound Care Topical Primary Dressing Santyl Ointment Discharge Instruction: Apply nickel thick amount to wound bed as instructed Secondary Dressing ABD Pad, 8x10 Discharge Instruction: Apply over  primary dressing as directed. Secured With Elastic Bandage 4 inch (ACE bandage) Discharge Instruction: Secure with ACE bandage as directed. Kerlix Roll Sterile, 4.5x3.1 (in/yd) Discharge Instruction: Secure with Kerlix as directed. Transpore Surgical Tape, 2x10 (in/yd) Discharge Instruction: Secure dressing with tape as directed. Compression Wrap Compression Stockings Add-Ons Electronic Signature(s) Signed: 03/18/2021 5:57:57 PM By: Lorrin Jackson Entered By: Lorrin Jackson on 03/18/2021 09:10:26 -------------------------------------------------------------------------------- Wound Assessment Details Patient Name: Date of Service: Candace Robinson, Candace RO L L. 03/18/2021 8:45 A M Medical Record Number: [id number] Patient Account Number: [id number] Date of Birth/Sex: Treating RN: 04/09/1966 (55 y.o. Candace Robinson Primary Care Lylianna Fraiser: [name] Other Clinician: Referring Katherine Tout: Treating Rett Stehlik/Extender: [name] Weeks in Treatment: 3 Wound Status Wound Number: 6 Primary Diabetic Wound/Ulcer of the Lower Extremity Etiology: Wound Location: Left, Medial Foot Wound Open Wounding Event: Gradually Appeared Status: Date Acquired: 04/03/2020 Comorbid Anemia, Arrhythmia, Congestive Heart Failure, Hypertension, Weeks Of Treatment: 3 History: Type II Diabetes, Lupus Erythematosus Clustered Wound: No Photos Wound Measurements Length: (cm) 0.3 Width: (cm) 0.4 Depth: (cm) 0.1 Area: (cm) 0.094 Volume: (cm) 0.009 % Reduction in Area: 70.1% % Reduction in Volume: 71% Epithelialization: Large (67-100%) Tunneling: No Undermining: No Wound Description Classification: Grade 2 Wound Margin: Distinct, outline attached Exudate Amount: Medium Exudate Type: Serosanguineous Exudate Color: red, brown Foul Odor After Cleansing: No Slough/Fibrino No Wound Bed Granulation Amount: Large (67-100%) Exposed Structure Granulation Quality: Red,  Pink Fascia Exposed: No Necrotic Amount: None Present (0%) Fat Layer (Subcutaneous Tissue) Exposed: Yes Tendon Exposed: No Muscle Exposed: No Joint Exposed: No Bone Exposed: No Treatment Notes Wound #6 (Foot) Wound Laterality: Left, Medial Cleanser Soap and Water Discharge Instruction: May shower and wash wound with dial antibacterial soap and water prior to dressing change. Wound Cleanser Discharge Instruction: Cleanse the wound with wound cleanser prior to applying a clean dressing using gauze sponges, not tissue or cotton balls. Peri-Wound Care Topical Primary Dressing Santyl Ointment Discharge Instruction: Apply nickel thick amount to wound bed as instructed Secondary Dressing ABD Pad, 8x10 Discharge Instruction: Apply over primary dressing as directed. Secured With Elastic Bandage 4 inch (ACE bandage) Discharge Instruction: Secure with ACE bandage as directed. Kerlix Roll Sterile, 4.5x3.1 (in/yd) Discharge Instruction: Secure with Kerlix as directed. Transpore Surgical Tape, 2x10 (in/yd) Discharge Instruction: Secure dressing with tape as directed. Compression Wrap Compression Stockings Add-Ons Electronic Signature(s) Signed: 03/18/2021 5:57:57 PM By: Lorrin Jackson Entered By: Lorrin Jackson on 03/18/2021 09:11:32 -------------------------------------------------------------------------------- Vitals Details Patient Name: Date of Service: Candace Robinson, Candace RO L L. 03/18/2021 8:45 A M Medical Record Number: [id number] Patient Account Number: [id number] Date of Birth/Sex: Treating RN: May 27, 1966 (55 y.o. Candace Robinson Primary Care Tobie Hellen: [name] Other Clinician: Referring Kayci [place]: Treating Curtina Grills/Extender: [name] Weeks in Treatment: 3  Vital Signs Time Taken: 08:48 Temperature (F): 99.1 Height (in): 63 Pulse (bpm): 80 Weight (lbs): 157 Respiratory Rate (breaths/min): 20 Body Mass Index (BMI): 27.8 Blood Pressure  (mmHg): 129/77 Reference Range: 80 - 120 mg / dl Electronic Signature(s) Signed: 03/18/2021 5:57:57 PM By: Lorrin Jackson Entered By: Lorrin Jackson on 03/18/2021 08:48:31

## 2021-03-24 LAB — AEROBIC/ANAEROBIC CULTURE W GRAM STAIN (SURGICAL/DEEP WOUND)

## 2021-03-25 ENCOUNTER — Other Ambulatory Visit: Payer: Self-pay

## 2021-03-25 ENCOUNTER — Encounter (HOSPITAL_BASED_OUTPATIENT_CLINIC_OR_DEPARTMENT_OTHER): Payer: Medicaid Other | Admitting: Internal Medicine

## 2021-03-25 DIAGNOSIS — L97529 Non-pressure chronic ulcer of other part of left foot with unspecified severity: Secondary | ICD-10-CM

## 2021-03-25 DIAGNOSIS — L97519 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: Secondary | ICD-10-CM | POA: Diagnosis not present

## 2021-03-25 DIAGNOSIS — E11621 Type 2 diabetes mellitus with foot ulcer: Secondary | ICD-10-CM | POA: Diagnosis not present

## 2021-03-31 NOTE — Progress Notes (Signed)
ACIRE, TANG ([id number]) Visit Report for 03/25/2021 Arrival Information Details Patient Name: Date of Service: Candace Robinson, [name] 03/25/2021 9:15 A M Medical Record Number: [id number] Patient Account Number: [id number] Date of Birth/Sex: Treating RN: 03/03/1966 (55 y.o. Female) Lorrin Jackson Primary Care Edmar Blankenburg: [name] Other Clinician: Referring Muzamil Harker: Treating Keen Ewalt/Extender: Darcey Nora in Treatment: 4 Visit Information History Since Last Visit All ordered tests and consults were completed: Yes Patient Arrived: [name] Added or deleted any medications: No Arrival Time: 09:30 Any new allergies or adverse reactions: No Transfer Assistance: Candace Robinson Had a fall or experienced change in No Patient Identification Verified: Yes activities of daily living that may affect Secondary Verification Process Completed: Yes risk of falls: Patient Requires Transmission-Based No Signs or symptoms of abuse/neglect since last visito No Precautions: Hospitalized since last visit: No Patient Has Alerts: Yes Implantable device outside of the clinic excluding No Patient Alerts: R ABI: 1.0 L ABI: 1.1 cellular tissue based products placed in the center NO BENZOCAINE since last visit: USE LIDOCAINE GEL Has Dressing in Place as Prescribed: Yes ONLY Pain Present Now: Yes Electronic Signature(s) Signed: 03/25/2021 4:57:50 PM By: Lorrin Jackson Entered By: Lorrin Jackson on 03/25/2021 09:36:18 -------------------------------------------------------------------------------- Encounter Discharge Information Details Patient Name: Date of Service: Candace Robinson, [name] 03/25/2021 9:15 A M Medical Record Number: [id number] Patient Account Number: [id number] Date of Birth/Sex: Treating RN: 07/13/1965 (55 y.o. Female) Lorrin Jackson Primary Care Natiya Seelinger: [name] Other Clinician: Referring Aariyana Manz: Treating Kydan Shanholtzer/Extender: Darcey Nora in Treatment: 4 Encounter Discharge Information Items Post Procedure Vitals Discharge Condition: Stable Temperature (F): 98.2 Ambulatory Status: Stretcher Pulse (bpm): 58 Discharge Destination: Home Respiratory Rate (breaths/min): 18 Transportation: Private Auto Blood Pressure (mmHg): 143/76 Schedule Follow-up Appointment: Yes Clinical Summary of Care: Provided on 03/25/2021 Form Type Recipient Paper Patient Patient Electronic Signature(s) Signed: 03/25/2021 4:57:50 PM By: Lorrin Jackson Entered By: Lorrin Jackson on 03/25/2021 10:26:59 -------------------------------------------------------------------------------- Lower Extremity Assessment Details Patient Name: Date of Service: Candace Robinson, [name] 03/25/2021 9:15 A M Medical Record Number: [id number] Patient Account Number: [id number] Date of Birth/Sex: Treating RN: 1965-10-19 (55 y.o. Female) Lorrin Jackson Primary Care Anntonette Madewell: [name] Other Clinician: Referring Binnie Vonderhaar: Treating Zarif Rathje/Extender: [name] Weeks in Treatment: 4 Edema Assessment Assessed: [name]: Yes] [Right: Yes] Edema: [Left: Yes] [Right: Yes] Calf Left: Right: Point of Measurement: 29 cm From Medial Instep 34 cm 33.5 cm Ankle Left: Right: Point of Measurement: 10 cm From Medial Instep 24 cm 23 cm Vascular Assessment Pulses: Dorsalis Pedis Palpable: [Left:Yes] [Right:Yes] Electronic Signature(s) Signed: 03/25/2021 4:57:50 PM By: Lorrin Jackson Entered By: Lorrin Jackson on 03/25/2021 09:54:50 -------------------------------------------------------------------------------- Multi Wound Chart Details Patient Name: Date of Service: Candace Robinson, [name] 03/25/2021 9:15 A M Medical Record Number: [id number] Patient Account Number: [id number] Date of Birth/Sex: Treating RN: 1965/11/04 (55 y.o. Female) [name] Primary Care Kaylise Blakeley: [name] Other  Clinician: Referring Joline Encalada: Treating Kloe Oates/Extender: [name] Weeks in Treatment: 4 Vital Signs Height(in): 52 Pulse(bpm): 65 Weight(lbs): 157 Blood Pressure(mmHg): 143/76 Body Mass Index(BMI): 28 Temperature(F): 98.2 Respiratory Rate(breaths/min): 18 Photos: [1:Right, Medial Ankle] [2:Right, Dorsal Foot] [3:Left, Medial Ankle] Wound Location: [1:Gradually Appeared] [2:Gradually Appeared] [3:Gradually Appeared] Wounding Event: [1:Diabetic Wound/Ulcer of the Lower] [2:Diabetic Wound/Ulcer of the Lower] [3:Diabetic Wound/Ulcer of the Lower] Primary Etiology: [1:Extremity Anemia, Arrhythmia, Congestive Heart Anemia, Arrhythmia, Congestive Heart Anemia, Arrhythmia, Congestive Heart] [2:Extremity] [3:Extremity] Comorbid History: [  1:Failure, Hypertension, Type II Diabetes, Lupus Erythematosus 04/03/2020] [2:Failure, Hypertension, Type II Diabetes, Lupus Erythematosus 04/03/2020] [3:Failure, Hypertension, Type II Diabetes, Lupus Erythematosus 02/22/2021] Date Acquired: [1:4] [2:4] [3:4] Weeks of Treatment: [1:Open] [2:Open] [3:Open] Wound Status: [1:5.4x4.3x0.2] [2:6.5x5x0.1] [3:1.5x1.8x0.1] Measurements [name] [name] D (cm) [1:18.237] [2:25.525] [3:2.121] A (cm) : rea [1:3.647] [2:2.553] [3:0.212] Volume (cm) : [1:-10.60%] [2:-116.70%] [3:-8.00%] % Reduction in A [1:rea: -10.50%] [2:-116.70%] [3:46.10%] % Reduction in Volume: [1:Grade 2] [2:Grade 2] [3:Grade 2] Classification: [1:Medium] [2:Medium] [3:Medium] Exudate A mount: [1:Serosanguineous] [2:Serosanguineous] [3:Serosanguineous] Exudate Type: [1:red, brown] [2:red, brown] [3:red, brown] Exudate Color: [1:Distinct, outline attached] [2:Distinct, outline attached] [3:Distinct, outline attached] Wound Margin: [1:Small (1-33%)] [2:Medium (34-66%)] [3:Medium (34-66%)] Granulation A mount: [1:Red] [2:Red] [3:Red] Granulation Quality: [1:Large (67-100%)] [2:Medium (34-66%)] [3:Medium (34-66%)] Necrotic A  mount: [1:Fat Layer (Subcutaneous Tissue): Yes Fat Layer (Subcutaneous Tissue): Yes Fat Layer (Subcutaneous Tissue): Yes] Exposed Structures: [1:Fascia: No Tendon: No Muscle: No Joint: No Bone: No Candace Robinson] [2:Fascia: No Tendon: No Muscle: No Joint: No Bone: No Candace Robinson] [3:Fascia: No Tendon: No Muscle: No Joint: No Bone: No Large (67-100%)] Epithelialization: [1:Debridement - Selective/Open Wound N/A] [3:N/A] Debridement: Pre-procedure Verification/Time Out 10:11 [2:N/A] [3:N/A] Taken: [1:Lidocaine 4% Topical Solution] [2:N/A] [3:N/A] Pain Control: [1:Slough] [2:N/A] [3:N/A] Tissue Debrided: [1:Non-Viable Tissue] [2:N/A] [3:N/A] Level: [1:12] [2:N/A] [3:N/A] Debridement A (sq cm): [1:rea Curette] [2:N/A] [3:N/A] Instrument: [1:Minimum] [2:N/A] [3:N/A] Bleeding: [1:Pressure] [2:N/A] [3:N/A] Hemostasis A chieved: [1:Procedure was tolerated well] [2:N/A] [3:N/A] Debridement Treatment Response: [1:5.4x4.3x0.2] [2:N/A] [3:N/A] Post Debridement Measurements [name] [name] D (cm) [1:3.647] [2:N/A] [3:N/A] Post Debridement Volume: (cm) [1:Debridement] [2:N/A] [3:N/A] Wound Number: 4 5 6  Photos: Left, Lateral Ankle Left, Dorsal Foot Left, Distal, Medial Foot Wound Location: Gradually Appeared Gradually Appeared Gradually Appeared Wounding Event: Diabetic Wound/Ulcer of the Lower Diabetic Wound/Ulcer of the Lower Diabetic Wound/Ulcer of the Lower Primary Etiology: Extremity Extremity Extremity Anemia, Arrhythmia, Congestive Heart Anemia, Arrhythmia, Congestive Heart Anemia, Arrhythmia, Congestive Heart Comorbid History: Failure, Hypertension, Type II Failure, Hypertension, Type II Failure, Hypertension, Type II Diabetes, Lupus Erythematosus Diabetes, Lupus Erythematosus Diabetes, Lupus Erythematosus 04/03/2020 04/03/2020 04/03/2020 Date Acquired: 4 4 4  Weeks of Treatment: Open Open Open Wound Status: 8x5x0.1 9x9.5x0.2 0.3x0.3x0.1 Measurements [name] [name] D (cm) 31.416 67.152 0.071 A (cm) : rea 3.142  13.43 0.007 Volume (cm) : 16.70% -11.80% 77.40% % Reduction in A rea: 58.30% -123.50% 77.40% % Reduction in Volume: Grade 2 Grade 2 Grade 2 Classification: Medium Medium Medium Exudate A mount: Serosanguineous Serosanguineous Serosanguineous Exudate Type: red, brown red, brown red, brown Exudate Color: Distinct, outline attached Distinct, outline attached Distinct, outline attached Wound Margin: Medium (34-66%) Small (1-33%) Medium (34-66%) Granulation A mount: Red Red Red, Pink Granulation Quality: Medium (34-66%) Large (67-100%) Medium (34-66%) Necrotic A mount: Fat Layer (Subcutaneous Tissue): Yes Fat Layer (Subcutaneous Tissue): Yes Fat Layer (Subcutaneous Tissue): Yes Exposed Structures: Fascia: No Fascia: No Fascia: No Tendon: No Tendon: No Tendon: No Muscle: No Muscle: No Muscle: No Joint: No Joint: No Joint: No Bone: No Bone: No Bone: No Small (1-33%) Candace Robinson Large (67-100%) Epithelialization: N/A Debridement - Selective/Open Wound N/A Debridement: N/A 10:11 N/A Pre-procedure Verification/Time Out Taken: N/A Lidocaine 4% Topical Solution N/A Pain Control: N/A Slough N/A Tissue Debrided: N/A Non-Viable Tissue N/A Level: N/A 16 N/A Debridement A (sq cm): rea N/A Curette N/A Instrument: N/A Minimum N/A Bleeding: N/A Pressure N/A Hemostasis A chieved: N/A Procedure was tolerated well N/A Debridement Treatment Response: N/A 9x9.5x0.2 N/A Post Debridement Measurements [name] [name] D (cm) N/A 13.43  N/A Post Debridement Volume: (cm) N/A Debridement N/A Procedures Performed: Treatment Notes Wound #1 (Ankle) Wound Laterality: Right, Medial Cleanser Soap and Water Discharge Instruction: May shower and wash wound with dial antibacterial soap and water prior to dressing change. Wound Cleanser Discharge Instruction: Cleanse the wound with wound cleanser prior to applying a clean dressing using gauze sponges, not tissue or cotton balls. Peri-Wound  Care Topical Gentamicin Discharge Instruction: As directed by physician Primary Dressing KerraCel Ag Gelling Fiber Dressing, 4x5 in (silver alginate) Discharge Instruction: Apply silver alginate to wound bed as instructed Santyl Ointment Discharge Instruction: Apply nickel thick amount to wound bed as instructed Secondary Dressing Woven Gauze Sponge, Non-Sterile 4x4 in Discharge Instruction: Apply over primary dressing as directed. ABD Pad, 8x10 Discharge Instruction: Apply over primary dressing as directed. Secured With Elastic Bandage 4 inch (ACE bandage) Discharge Instruction: Secure with ACE bandage as directed. Kerlix Roll Sterile, 4.5x3.1 (in/yd) Discharge Instruction: Secure with Kerlix as directed. Transpore Surgical Tape, 2x10 (in/yd) Discharge Instruction: Secure dressing with tape as directed. Compression Wrap Compression Stockings Add-Ons Wound #2 (Foot) Wound Laterality: Dorsal, Right Cleanser Soap and Water Discharge Instruction: May shower and wash wound with dial antibacterial soap and water prior to dressing change. Wound Cleanser Discharge Instruction: Cleanse the wound with wound cleanser prior to applying a clean dressing using gauze sponges, not tissue or cotton balls. Peri-Wound Care Topical Gentamicin Discharge Instruction: As directed by physician Primary Dressing KerraCel Ag Gelling Fiber Dressing, 4x5 in (silver alginate) Discharge Instruction: Apply silver alginate to wound bed as instructed Secondary Dressing Woven Gauze Sponge, Non-Sterile 4x4 in Discharge Instruction: Apply over primary dressing as directed. ABD Pad, 8x10 Discharge Instruction: Apply over primary dressing as directed. Secured With Elastic Bandage 4 inch (ACE bandage) Discharge Instruction: Secure with ACE bandage as directed. Kerlix Roll Sterile, 4.5x3.1 (in/yd) Discharge Instruction: Secure with Kerlix as directed. Transpore Surgical Tape, 2x10 (in/yd) Discharge  Instruction: Secure dressing with tape as directed. Compression Wrap Compression Stockings Add-Ons Wound #3 (Ankle) Wound Laterality: Left, Medial Cleanser Soap and Water Discharge Instruction: May shower and wash wound with dial antibacterial soap and water prior to dressing change. Wound Cleanser Discharge Instruction: Cleanse the wound with wound cleanser prior to applying a clean dressing using gauze sponges, not tissue or cotton balls. Peri-Wound Care Topical Gentamicin Discharge Instruction: As directed by physician Primary Dressing KerraCel Ag Gelling Fiber Dressing, 4x5 in (silver alginate) Discharge Instruction: Apply silver alginate to wound bed as instructed Secondary Dressing Woven Gauze Sponge, Non-Sterile 4x4 in Discharge Instruction: Apply over primary dressing as directed. ABD Pad, 8x10 Discharge Instruction: Apply over primary dressing as directed. Secured With Elastic Bandage 4 inch (ACE bandage) Discharge Instruction: Secure with ACE bandage as directed. Kerlix Roll Sterile, 4.5x3.1 (in/yd) Discharge Instruction: Secure with Kerlix as directed. Transpore Surgical Tape, 2x10 (in/yd) Discharge Instruction: Secure dressing with tape as directed. Compression Wrap Compression Stockings Add-Ons Wound #4 (Ankle) Wound Laterality: Left, Lateral Cleanser Soap and Water Discharge Instruction: May shower and wash wound with dial antibacterial soap and water prior to dressing change. Wound Cleanser Discharge Instruction: Cleanse the wound with wound cleanser prior to applying a clean dressing using gauze sponges, not tissue or cotton balls. Peri-Wound Care Topical Gentamicin Discharge Instruction: As directed by physician Primary Dressing KerraCel Ag Gelling Fiber Dressing, 4x5 in (silver alginate) Discharge Instruction: Apply silver alginate to wound bed as instructed Secondary Dressing Woven Gauze Sponge, Non-Sterile 4x4 in Discharge Instruction: Apply over  primary dressing as directed. ABD Pad, 8x10 Discharge Instruction:  Apply over primary dressing as directed. Secured With Elastic Bandage 4 inch (ACE bandage) Discharge Instruction: Secure with ACE bandage as directed. Kerlix Roll Sterile, 4.5x3.1 (in/yd) Discharge Instruction: Secure with Kerlix as directed. Transpore Surgical Tape, 2x10 (in/yd) Discharge Instruction: Secure dressing with tape as directed. Compression Wrap Compression Stockings Add-Ons Wound #5 (Foot) Wound Laterality: Dorsal, Left Cleanser Soap and Water Discharge Instruction: May shower and wash wound with dial antibacterial soap and water prior to dressing change. Wound Cleanser Discharge Instruction: Cleanse the wound with wound cleanser prior to applying a clean dressing using gauze sponges, not tissue or cotton balls. Peri-Wound Care Topical Gentamicin Discharge Instruction: As directed by physician Primary Dressing KerraCel Ag Gelling Fiber Dressing, 4x5 in (silver alginate) Discharge Instruction: Apply silver alginate to wound bed as instructed Santyl Ointment Discharge Instruction: Apply nickel thick amount to wound bed as instructed Secondary Dressing Woven Gauze Sponge, Non-Sterile 4x4 in Discharge Instruction: Apply over primary dressing as directed. ABD Pad, 8x10 Discharge Instruction: Apply over primary dressing as directed. Secured With Elastic Bandage 4 inch (ACE bandage) Discharge Instruction: Secure with ACE bandage as directed. Kerlix Roll Sterile, 4.5x3.1 (in/yd) Discharge Instruction: Secure with Kerlix as directed. Transpore Surgical Tape, 2x10 (in/yd) Discharge Instruction: Secure dressing with tape as directed. Compression Wrap Compression Stockings Add-Ons Wound #6 (Foot) Wound Laterality: Left, Medial, Distal Cleanser Soap and Water Discharge Instruction: May shower and wash wound with dial antibacterial soap and water prior to dressing change. Wound Cleanser Discharge  Instruction: Cleanse the wound with wound cleanser prior to applying a clean dressing using gauze sponges, not tissue or cotton balls. Peri-Wound Care Topical Gentamicin Discharge Instruction: As directed by physician Primary Dressing KerraCel Ag Gelling Fiber Dressing, 4x5 in (silver alginate) Discharge Instruction: Apply silver alginate to wound bed as instructed Secondary Dressing Woven Gauze Sponge, Non-Sterile 4x4 in Discharge Instruction: Apply over primary dressing as directed. ABD Pad, 8x10 Discharge Instruction: Apply over primary dressing as directed. Secured With Elastic Bandage 4 inch (ACE bandage) Discharge Instruction: Secure with ACE bandage as directed. Kerlix Roll Sterile, 4.5x3.1 (in/yd) Discharge Instruction: Secure with Kerlix as directed. Transpore Surgical Tape, 2x10 (in/yd) Discharge Instruction: Secure dressing with tape as directed. Compression Wrap Compression Stockings Add-Ons Electronic Signature(s) Signed: 03/25/2021 10:38:25 AM By: Kalman Shan DO Signed: 03/31/2021 5:21:18 PM By: [name] RN Entered By: Kalman Shan on 03/25/2021 10:28:27 -------------------------------------------------------------------------------- Multi-Disciplinary Care Plan Details Patient Name: Date of Service: Candace Robinson, [name] 03/25/2021 9:15 A M Medical Record Number: [id number] Patient Account Number: [id number] Date of Birth/Sex: Treating RN: 1966-06-21 (55 y.o. Female) Lorrin Jackson Primary Care Adhira Jamil: [name] Other Clinician: Referring Stevee Valenta: Treating Shmiel Morton/Extender: Darcey Nora in Treatment: 4 Active Inactive Tissue Oxygenation Nursing Diagnoses: Actual ineffective tissue perfusion; peripheral (select once diagnosis is confirmed) Goals: Patient/caregiver will verbalize understanding of disease process and disease management Date Initiated: 02/22/2021 Target Resolution Date: 03/29/2021 Goal  Status: Active Interventions: Assess patient understanding of disease process and management upon diagnosis and as needed Assess peripheral arterial status upon admission and as needed Provide education on tissue oxygenation and ischemia Treatment Activities: T ordered outside of clinic : 02/22/2021 est Notes: Wound/Skin Impairment Nursing Diagnoses: Impaired tissue integrity Goals: Patient/caregiver will verbalize understanding of skin care regimen Date Initiated: 02/22/2021 Target Resolution Date: 04/29/2021 Goal Status: Active Ulcer/skin breakdown will have a volume reduction of 30% by week 4 Date Initiated: 02/22/2021 Target Resolution Date: 04/29/2021 Goal Status: Active Interventions: Assess patient/caregiver ability to obtain necessary supplies Assess  patient/caregiver ability to perform ulcer/skin care regimen upon admission and as needed Assess ulceration(s) every visit Provide education on ulcer and skin care Treatment Activities: Topical wound management initiated : 02/22/2021 Notes: 03/25/21: All wounds not yet at 30%, culture positive. Using gent cream. Electronic Signature(s) Signed: 03/25/2021 4:57:50 PM By: Lorrin Jackson Entered By: Lorrin Jackson on 03/25/2021 10:04:40 -------------------------------------------------------------------------------- Pain Assessment Details Patient Name: Date of Service: Candace Robinson, [name] 03/25/2021 9:15 A M Medical Record Number: [id number] Patient Account Number: [id number] Date of Birth/Sex: Treating RN: [birth year] (55 y.o. Female) Lorrin Jackson Primary Care Lenise Jr: [name] Other Clinician: Referring Conrad Zajkowski: Treating Laporcha Marchesi/Extender: [name] Weeks in Treatment: 4 Active Problems Location of Pain Severity and Description of Pain Patient Has Paino Yes Site Locations Pain Location: Pain in Ulcers With Dressing Change: Yes Rate the pain. Current Pain Level: 7 Character of  Pain Describe the Pain: Stabbing, Throbbing Pain Management and Medication Current Pain Management: Medication: Yes Cold Application: No Rest: Yes Massage: No Activity: No T.E.N.S.: No Heat Application: No Leg drop or elevation: No Is the Current Pain Management Adequate: Adequate How does your wound impact your activities of daily livingo Sleep: No Bathing: No Appetite: No Relationship With Others: No Bladder Continence: No Emotions: No Bowel Continence: No Work: No Toileting: No Drive: No Dressing: No Hobbies: No Electronic Signature(s) Signed: 03/25/2021 4:57:50 PM By: Lorrin Jackson Entered By: Lorrin Jackson on 03/25/2021 09:37:21 -------------------------------------------------------------------------------- Patient/Caregiver Education Details Patient Name: Date of Service: Candace A [name], [name] 9/22/2022andnbsp9:15 A M Medical Record Number: [id number] Patient Account Number: [id number] Date of Birth/Gender: Treating RN: 09-09-1965 (55 y.o. Female) Lorrin Jackson Primary Care Physician: [name] Other Clinician: Referring Physician: Treating Physician/Extender: Darcey Nora in Treatment: 4 Education Assessment Education Provided To: Patient Education Topics Provided Infection: Methods: Explain/Verbal Responses: State content correctly Wound/Skin Impairment: Methods: Explain/Verbal, Printed Responses: State content correctly Electronic Signature(s) Signed: 03/25/2021 4:57:50 PM By: Lorrin Jackson Entered By: Lorrin Jackson on 03/25/2021 09:29:06 -------------------------------------------------------------------------------- Wound Assessment Details Patient Name: Date of Service: Candace Robinson, [name] 03/25/2021 9:15 A M Medical Record Number: [id number] Patient Account Number: [id number] Date of Birth/Sex: Treating RN: 04/06/1966 (55 y.o. Female) Lorrin Jackson Primary Care Soffia Doshier: [name] Other  Clinician: Referring Gatha Mcnulty: Treating Domenica Weightman/Extender: [name] Weeks in Treatment: 4 Wound Status Wound Number: 1 Primary Diabetic Wound/Ulcer of the Lower Extremity Etiology: Wound Location: Right, Medial Ankle Wound Open Wounding Event: Gradually Appeared Status: Date Acquired: 04/03/2020 Comorbid Anemia, Arrhythmia, Congestive Heart Failure, Hypertension, Weeks Of Treatment: 4 History: Type II Diabetes, Lupus Erythematosus Clustered Wound: No Photos Wound Measurements Length: (cm) 5.4 Width: (cm) 4.3 Depth: (cm) 0.2 Area: (cm) 18.237 Volume: (cm) 3.647 % Reduction in Area: -10.6% % Reduction in Volume: -10.5% Epithelialization: Candace Robinson Tunneling: No Undermining: No Wound Description Classification: Grade 2 Wound Margin: Distinct, outline attached Exudate Amount: Medium Exudate Type: Serosanguineous Exudate Color: red, brown Foul Odor After Cleansing: No Slough/Fibrino Yes Wound Bed Granulation Amount: Small (1-33%) Exposed Structure Granulation Quality: Red Fascia Exposed: No Necrotic Amount: Large (67-100%) Fat Layer (Subcutaneous Tissue) Exposed: Yes Necrotic Quality: Adherent Slough Tendon Exposed: No Muscle Exposed: No Joint Exposed: No Bone Exposed: No Treatment Notes Wound #1 (Ankle) Wound Laterality: Right, Medial Cleanser Soap and Water Discharge Instruction: May shower and wash wound with dial antibacterial soap and water prior to dressing change. Wound Cleanser Discharge Instruction: Cleanse the wound with wound cleanser prior to applying  a clean dressing using gauze sponges, not tissue or cotton balls. Peri-Wound Care Topical Gentamicin Discharge Instruction: As directed by physician Primary Dressing KerraCel Ag Gelling Fiber Dressing, 4x5 in (silver alginate) Discharge Instruction: Apply silver alginate to wound bed as instructed Santyl Ointment Discharge Instruction: Apply nickel thick amount to wound bed as  instructed Secondary Dressing Woven Gauze Sponge, Non-Sterile 4x4 in Discharge Instruction: Apply over primary dressing as directed. ABD Pad, 8x10 Discharge Instruction: Apply over primary dressing as directed. Secured With Elastic Bandage 4 inch (ACE bandage) Discharge Instruction: Secure with ACE bandage as directed. Kerlix Roll Sterile, 4.5x3.1 (in/yd) Discharge Instruction: Secure with Kerlix as directed. Transpore Surgical Tape, 2x10 (in/yd) Discharge Instruction: Secure dressing with tape as directed. Compression Wrap Compression Stockings Add-Ons Electronic Signature(s) Signed: 03/25/2021 4:57:50 PM By: Lorrin Jackson Entered By: Lorrin Jackson on 03/25/2021 09:57:47 -------------------------------------------------------------------------------- Wound Assessment Details Patient Name: Date of Service: Candace Robinson, [name] 03/25/2021 9:15 A M Medical Record Number: [id number] Patient Account Number: [id number] Date of Birth/Sex: Treating RN: 14-May-1966 (55 y.o. Female) Lorrin Jackson Primary Care Winfred Iiams: [name] Other Clinician: Referring Joleah Kosak: Treating Landrie Beale/Extender: [name] Weeks in Treatment: 4 Wound Status Wound Number: 2 Primary Diabetic Wound/Ulcer of the Lower Extremity Etiology: Wound Location: Right, Dorsal Foot Wound Open Wounding Event: Gradually Appeared Status: Date Acquired: 04/03/2020 Comorbid Anemia, Arrhythmia, Congestive Heart Failure, Hypertension, Weeks Of Treatment: 4 History: Type II Diabetes, Lupus Erythematosus Clustered Wound: No Photos Wound Measurements Length: (cm) 6.5 Width: (cm) 5 Depth: (cm) 0.1 Area: (cm) 25.525 Volume: (cm) 2.553 % Reduction in Area: -116.7% % Reduction in Volume: -116.7% Epithelialization: Candace Robinson Tunneling: No Undermining: No Wound Description Classification: Grade 2 Wound Margin: Distinct, outline attached Exudate Amount: Medium Exudate Type:  Serosanguineous Exudate Color: red, brown Wound Bed Granulation Amount: Medium (34-66%) Granulation Quality: Red Necrotic Amount: Medium (34-66%) Necrotic Quality: Adherent Slough Foul Odor After Cleansing: No Slough/Fibrino Yes Exposed Structure Fascia Exposed: No Fat Layer (Subcutaneous Tissue) Exposed: Yes Tendon Exposed: No Muscle Exposed: No Joint Exposed: No Bone Exposed: No Treatment Notes Wound #2 (Foot) Wound Laterality: Dorsal, Right Cleanser Soap and Water Discharge Instruction: May shower and wash wound with dial antibacterial soap and water prior to dressing change. Wound Cleanser Discharge Instruction: Cleanse the wound with wound cleanser prior to applying a clean dressing using gauze sponges, not tissue or cotton balls. Peri-Wound Care Topical Gentamicin Discharge Instruction: As directed by physician Primary Dressing KerraCel Ag Gelling Fiber Dressing, 4x5 in (silver alginate) Discharge Instruction: Apply silver alginate to wound bed as instructed Secondary Dressing Woven Gauze Sponge, Non-Sterile 4x4 in Discharge Instruction: Apply over primary dressing as directed. ABD Pad, 8x10 Discharge Instruction: Apply over primary dressing as directed. Secured With Elastic Bandage 4 inch (ACE bandage) Discharge Instruction: Secure with ACE bandage as directed. Kerlix Roll Sterile, 4.5x3.1 (in/yd) Discharge Instruction: Secure with Kerlix as directed. Transpore Surgical Tape, 2x10 (in/yd) Discharge Instruction: Secure dressing with tape as directed. Compression Wrap Compression Stockings Add-Ons Electronic Signature(s) Signed: 03/25/2021 4:57:50 PM By: Lorrin Jackson Entered By: Lorrin Jackson on 03/25/2021 09:58:19 -------------------------------------------------------------------------------- Wound Assessment Details Patient Name: Date of Service: Candace Robinson, [name] 03/25/2021 9:15 A M Medical Record Number: [id number] Patient Account Number:  [id number] Date of Birth/Sex: Treating RN: Mar 01, 1966 (55 y.o. Female) Lorrin Jackson Primary Care Ebin Palazzi: [name] Other Clinician: Referring Nicki Furlan: Treating Jaya Lapka/Extender: [name] Weeks in Treatment: 4 Wound Status Wound Number: 3 Primary Diabetic Wound/Ulcer of the Lower Extremity  Etiology: Wound Location: Left, Medial Ankle Wound Open Wounding Event: Gradually Appeared Status: Date Acquired: 02/22/2021 Comorbid Anemia, Arrhythmia, Congestive Heart Failure, Hypertension, Weeks Of Treatment: 4 History: Type II Diabetes, Lupus Erythematosus Clustered Wound: No Photos Wound Measurements Length: (cm) 1.5 Width: (cm) 1.8 Depth: (cm) 0.1 Area: (cm) 2.121 Volume: (cm) 0.212 % Reduction in Area: -8% % Reduction in Volume: 46.1% Epithelialization: Large (67-100%) Tunneling: No Undermining: No Wound Description Classification: Grade 2 Wound Margin: Distinct, outline attached Exudate Amount: Medium Exudate Type: Serosanguineous Exudate Color: red, brown Foul Odor After Cleansing: No Slough/Fibrino Yes Wound Bed Granulation Amount: Medium (34-66%) Exposed Structure Granulation Quality: Red Fascia Exposed: No Necrotic Amount: Medium (34-66%) Fat Layer (Subcutaneous Tissue) Exposed: Yes Necrotic Quality: Adherent Slough Tendon Exposed: No Muscle Exposed: No Joint Exposed: No Bone Exposed: No Treatment Notes Wound #3 (Ankle) Wound Laterality: Left, Medial Cleanser Soap and Water Discharge Instruction: May shower and wash wound with dial antibacterial soap and water prior to dressing change. Wound Cleanser Discharge Instruction: Cleanse the wound with wound cleanser prior to applying a clean dressing using gauze sponges, not tissue or cotton balls. Peri-Wound Care Topical Gentamicin Discharge Instruction: As directed by physician Primary Dressing KerraCel Ag Gelling Fiber Dressing, 4x5 in (silver alginate) Discharge  Instruction: Apply silver alginate to wound bed as instructed Secondary Dressing Woven Gauze Sponge, Non-Sterile 4x4 in Discharge Instruction: Apply over primary dressing as directed. ABD Pad, 8x10 Discharge Instruction: Apply over primary dressing as directed. Secured With Elastic Bandage 4 inch (ACE bandage) Discharge Instruction: Secure with ACE bandage as directed. Kerlix Roll Sterile, 4.5x3.1 (in/yd) Discharge Instruction: Secure with Kerlix as directed. Transpore Surgical Tape, 2x10 (in/yd) Discharge Instruction: Secure dressing with tape as directed. Compression Wrap Compression Stockings Add-Ons Electronic Signature(s) Signed: 03/25/2021 4:57:50 PM By: Lorrin Jackson Entered By: Lorrin Jackson on 03/25/2021 09:59:08 -------------------------------------------------------------------------------- Wound Assessment Details Patient Name: Date of Service: Candace Robinson, [name] 03/25/2021 9:15 A M Medical Record Number: [id number] Patient Account Number: [id number] Date of Birth/Sex: Treating RN: [birth year] (55 y.o. Female) Lorrin Jackson Primary Care Karime Robinson: [name] Other Clinician: Referring Latasia Silberstein: Treating Debbie Bellucci/Extender: [name] Weeks in Treatment: 4 Wound Status Wound Number: 4 Primary Diabetic Wound/Ulcer of the Lower Extremity Etiology: Wound Location: Left, Lateral Ankle Wound Open Wounding Event: Gradually Appeared Status: Date Acquired: 04/03/2020 Comorbid Anemia, Arrhythmia, Congestive Heart Failure, Hypertension, Weeks Of Treatment: 4 History: Type II Diabetes, Lupus Erythematosus Clustered Wound: No Photos Wound Measurements Length: (cm) 8 Width: (cm) 5 Depth: (cm) 0.1 Area: (cm) 31.416 Volume: (cm) 3.142 % Reduction in Area: 16.7% % Reduction in Volume: 58.3% Epithelialization: Small (1-33%) Tunneling: No Undermining: No Wound Description Classification: Grade 2 Wound Margin: Distinct, outline  attached Exudate Amount: Medium Exudate Type: Serosanguineous Exudate Color: red, brown Foul Odor After Cleansing: No Slough/Fibrino Yes Wound Bed Granulation Amount: Medium (34-66%) Exposed Structure Granulation Quality: Red Fascia Exposed: No Necrotic Amount: Medium (34-66%) Fat Layer (Subcutaneous Tissue) Exposed: Yes Necrotic Quality: Adherent Slough Tendon Exposed: No Muscle Exposed: No Joint Exposed: No Bone Exposed: No Treatment Notes Wound #4 (Ankle) Wound Laterality: Left, Lateral Cleanser Soap and Water Discharge Instruction: May shower and wash wound with dial antibacterial soap and water prior to dressing change. Wound Cleanser Discharge Instruction: Cleanse the wound with wound cleanser prior to applying a clean dressing using gauze sponges, not tissue or cotton balls. Peri-Wound Care Topical Gentamicin Discharge Instruction: As directed by physician Primary Dressing KerraCel Ag Gelling Fiber Dressing, 4x5 in (silver alginate) Discharge Instruction:  Apply silver alginate to wound bed as instructed Secondary Dressing Woven Gauze Sponge, Non-Sterile 4x4 in Discharge Instruction: Apply over primary dressing as directed. ABD Pad, 8x10 Discharge Instruction: Apply over primary dressing as directed. Secured With Elastic Bandage 4 inch (ACE bandage) Discharge Instruction: Secure with ACE bandage as directed. Kerlix Roll Sterile, 4.5x3.1 (in/yd) Discharge Instruction: Secure with Kerlix as directed. Transpore Surgical Tape, 2x10 (in/yd) Discharge Instruction: Secure dressing with tape as directed. Compression Wrap Compression Stockings Add-Ons Electronic Signature(s) Signed: 03/25/2021 4:57:50 PM By: Lorrin Jackson Entered By: Lorrin Jackson on 03/25/2021 09:59:59 -------------------------------------------------------------------------------- Wound Assessment Details Patient Name: Date of Service: Candace Robinson, [name] 03/25/2021 9:15 A M Medical Record  Number: [id number] Patient Account Number: [id number] Date of Birth/Sex: Treating RN: 04-27-1966 (55 y.o. Female) Lorrin Jackson Primary Care Akshita Italiano: [name] Other Clinician: Referring Myrlene Riera: Treating Ovide Dusek/Extender: [name] Weeks in Treatment: 4 Wound Status Wound Number: 5 Primary Diabetic Wound/Ulcer of the Lower Extremity Etiology: Wound Location: Left, Dorsal Foot Wound Open Wounding Event: Gradually Appeared Status: Date Acquired: 04/03/2020 Comorbid Anemia, Arrhythmia, Congestive Heart Failure, Hypertension, Weeks Of Treatment: 4 History: Type II Diabetes, Lupus Erythematosus Clustered Wound: No Photos Wound Measurements Length: (cm) 9 Width: (cm) 9.5 Depth: (cm) 0.2 Area: (cm) 67.152 Volume: (cm) 13.43 % Reduction in Area: -11.8% % Reduction in Volume: -123.5% Epithelialization: Candace Robinson Wound Description Classification: Grade 2 Wound Margin: Distinct, outline attached Exudate Amount: Medium Exudate Type: Serosanguineous Exudate Color: red, brown Foul Odor After Cleansing: No Slough/Fibrino Yes Wound Bed Granulation Amount: Small (1-33%) Exposed Structure Granulation Quality: Red Fascia Exposed: No Necrotic Amount: Large (67-100%) Fat Layer (Subcutaneous Tissue) Exposed: Yes Necrotic Quality: Adherent Slough Tendon Exposed: No Muscle Exposed: No Joint Exposed: No Bone Exposed: No Treatment Notes Wound #5 (Foot) Wound Laterality: Dorsal, Left Cleanser Soap and Water Discharge Instruction: May shower and wash wound with dial antibacterial soap and water prior to dressing change. Wound Cleanser Discharge Instruction: Cleanse the wound with wound cleanser prior to applying a clean dressing using gauze sponges, not tissue or cotton balls. Peri-Wound Care Topical Gentamicin Discharge Instruction: As directed by physician Primary Dressing KerraCel Ag Gelling Fiber Dressing, 4x5 in (silver alginate) Discharge  Instruction: Apply silver alginate to wound bed as instructed Santyl Ointment Discharge Instruction: Apply nickel thick amount to wound bed as instructed Secondary Dressing Woven Gauze Sponge, Non-Sterile 4x4 in Discharge Instruction: Apply over primary dressing as directed. ABD Pad, 8x10 Discharge Instruction: Apply over primary dressing as directed. Secured With Elastic Bandage 4 inch (ACE bandage) Discharge Instruction: Secure with ACE bandage as directed. Kerlix Roll Sterile, 4.5x3.1 (in/yd) Discharge Instruction: Secure with Kerlix as directed. Transpore Surgical Tape, 2x10 (in/yd) Discharge Instruction: Secure dressing with tape as directed. Compression Wrap Compression Stockings Add-Ons Electronic Signature(s) Signed: 03/25/2021 4:57:50 PM By: Lorrin Jackson Entered By: Lorrin Jackson on 03/25/2021 10:00:58 -------------------------------------------------------------------------------- Wound Assessment Details Patient Name: Date of Service: Candace Robinson, [name] 03/25/2021 9:15 A M Medical Record Number: [id number] Patient Account Number: [id number] Date of Birth/Sex: Treating RN: 03/13/1966 (55 y.o. Female) Lorrin Jackson Primary Care Dejai Schubach: [name] Other Clinician: Referring Wilna Pennie: Treating Kaetlin Bullen/Extender: [name] Weeks in Treatment: 4 Wound Status Wound Number: 6 Primary Diabetic Wound/Ulcer of the Lower Extremity Etiology: Wound Location: Left, Distal, Medial Foot Wound Open Wounding Event: Gradually Appeared Status: Date Acquired: 04/03/2020 Comorbid Anemia, Arrhythmia, Congestive Heart Failure, Hypertension, Weeks Of Treatment: 4 History: Type II Diabetes, Lupus Erythematosus Clustered Wound: No Photos Wound  Measurements Length: (cm) 0.3 Width: (cm) 0.3 Depth: (cm) 0.1 Area: (cm) 0.071 Volume: (cm) 0.007 % Reduction in Area: 77.4% % Reduction in Volume: 77.4% Epithelialization: Large  (67-100%) Tunneling: No Undermining: No Wound Description Classification: Grade 2 Wound Margin: Distinct, outline attached Exudate Amount: Medium Exudate Type: Serosanguineous Exudate Color: red, brown Foul Odor After Cleansing: No Slough/Fibrino No Wound Bed Granulation Amount: Medium (34-66%) Exposed Structure Granulation Quality: Red, Pink Fascia Exposed: No Necrotic Amount: Medium (34-66%) Fat Layer (Subcutaneous Tissue) Exposed: Yes Necrotic Quality: Adherent Slough Tendon Exposed: No Muscle Exposed: No Joint Exposed: No Bone Exposed: No Treatment Notes Wound #6 (Foot) Wound Laterality: Left, Medial, Distal Cleanser Soap and Water Discharge Instruction: May shower and wash wound with dial antibacterial soap and water prior to dressing change. Wound Cleanser Discharge Instruction: Cleanse the wound with wound cleanser prior to applying a clean dressing using gauze sponges, not tissue or cotton balls. Peri-Wound Care Topical Gentamicin Discharge Instruction: As directed by physician Primary Dressing KerraCel Ag Gelling Fiber Dressing, 4x5 in (silver alginate) Discharge Instruction: Apply silver alginate to wound bed as instructed Secondary Dressing Woven Gauze Sponge, Non-Sterile 4x4 in Discharge Instruction: Apply over primary dressing as directed. ABD Pad, 8x10 Discharge Instruction: Apply over primary dressing as directed. Secured With Elastic Bandage 4 inch (ACE bandage) Discharge Instruction: Secure with ACE bandage as directed. Kerlix Roll Sterile, 4.5x3.1 (in/yd) Discharge Instruction: Secure with Kerlix as directed. Transpore Surgical Tape, 2x10 (in/yd) Discharge Instruction: Secure dressing with tape as directed. Compression Wrap Compression Stockings Add-Ons Electronic Signature(s) Signed: 03/25/2021 4:57:50 PM By: Lorrin Jackson Entered By: Lorrin Jackson on 03/25/2021  10:01:40 -------------------------------------------------------------------------------- Vitals Details Patient Name: Date of Service: Candace A [name], [name] 03/25/2021 9:15 A M Medical Record Number: [id number] Patient Account Number: [id number] Date of Birth/Sex: Treating RN: 1966-01-09 (55 y.o. Female) Lorrin Jackson Primary Care [facility]: [name] Other Clinician: Referring Akiva Josey: Treating Linet Brash/Extender: [name] Weeks in Treatment: 4 Vital Signs Time Taken: 09:36 Temperature (F): 98.2 Height (in): 63 Pulse (bpm): 58 Weight (lbs): 157 Respiratory Rate (breaths/min): 18 Body Mass Index (BMI): 27.8 Blood Pressure (mmHg): 143/76 Reference Range: 80 - 120 mg / dl Electronic Signature(s) Signed: 03/25/2021 4:57:50 PM By: Lorrin Jackson Entered By: Lorrin Jackson on 03/25/2021 09:36:43

## 2021-03-31 NOTE — Progress Notes (Signed)
Candace Robinson, Candace Robinson ([id number]) Visit Report for 03/25/2021 Chief Complaint Document Details Patient Name: Date of Service: [name], [name] 03/25/2021 9:15 A M Medical Record Number: [id number] Patient Account Number: [id number] Date of Birth/Sex: Treating RN: 08-Oct-1965 (56 y.o. Female) Candace Robinson Primary Care Provider: [name] Other Clinician: Referring Provider: Treating Provider/Extender: Darcey Nora in Treatment: 4 Information Obtained from: Patient Chief Complaint Bilateral feet wounds Electronic Signature(s) Signed: 03/25/2021 10:38:25 AM By: Kalman Shan DO Entered By: Kalman Shan on 03/25/2021 10:28:43 -------------------------------------------------------------------------------- Debridement Details Patient Name: Date of Service: BRO A Candace Robinson, [name] 03/25/2021 9:15 A M Medical Record Number: [id number] Patient Account Number: [id number] Date of Birth/Sex: Treating RN: 05-25-1966 (55 y.o. Female) Lorrin Jackson Primary Care Provider: [name] Other Clinician: Referring Provider: Treating Provider/Extender: [name] Weeks in Treatment: 4 Debridement Performed for Assessment: Wound #1 Right,Medial Ankle Performed By: Physician Kalman Shan, DO Debridement Type: Debridement Severity of Tissue Pre Debridement: Fat layer exposed Level of Consciousness (Pre-procedure): Awake and Alert Pre-procedure Verification/Time Out Yes - 10:11 Taken: Start Time: 10:12 Pain Control: Lidocaine 4% T opical Solution T Area Debrided (L x W): otal 4 (cm) x 3 (cm) = 12 (cm) Tissue and other material debrided: Non-Viable, Slough, Slough Level: Non-Viable Tissue Debridement Description: Selective/Open Wound Instrument: Curette Bleeding: Minimum Hemostasis Achieved: Pressure End Time: 10:15 Response to Treatment: Procedure was tolerated well Level of Consciousness (Post- Awake and  Alert procedure): Post Debridement Measurements of Total Wound Length: (cm) 5.4 Width: (cm) 4.3 Depth: (cm) 0.2 Volume: (cm) 3.647 Character of Wound/Ulcer Post Debridement: Stable Severity of Tissue Post Debridement: Fat layer exposed Post Procedure Diagnosis Same as Pre-procedure Electronic Signature(s) Signed: 03/25/2021 10:38:25 AM By: Kalman Shan DO Signed: 03/25/2021 4:57:50 PM By: Lorrin Jackson Entered By: Lorrin Jackson on 03/25/2021 10:18:12 -------------------------------------------------------------------------------- Debridement Details Patient Name: Date of Service: BRO A Candace Robinson, [name] 03/25/2021 9:15 A M Medical Record Number: [id number] Patient Account Number: [id number] Date of Birth/Sex: Treating RN: [birth year] (55 y.o. Female) Lorrin Jackson Primary Care Provider: [name] Other Clinician: Referring Provider: Treating Provider/Extender: [name] Weeks in Treatment: 4 Debridement Performed for Assessment: Wound #5 Left,Dorsal Foot Performed By: Physician Kalman Shan, DO Debridement Type: Debridement Severity of Tissue Pre Debridement: Fat layer exposed Level of Consciousness (Pre-procedure): Awake and Alert Pre-procedure Verification/Time Out Yes - 10:11 Taken: Start Time: 10:15 Pain Control: Lidocaine 4% T opical Solution T Area Debrided (L x W): otal 4 (cm) x 4 (cm) = 16 (cm) Tissue and other material debrided: Non-Viable, Slough, Slough Level: Non-Viable Tissue Debridement Description: Selective/Open Wound Instrument: Curette Bleeding: Minimum Hemostasis Achieved: Pressure End Time: 10:18 Response to Treatment: Procedure was tolerated well Level of Consciousness (Post- Awake and Alert procedure): Post Debridement Measurements of Total Wound Length: (cm) 9 Width: (cm) 9.5 Depth: (cm) 0.2 Volume: (cm) 13.43 Character of Wound/Ulcer Post Debridement: Stable Severity of Tissue Post Debridement:  Fat layer exposed Post Procedure Diagnosis Same as Pre-procedure Electronic Signature(s) Signed: 03/25/2021 10:38:25 AM By: Kalman Shan DO Signed: 03/25/2021 4:57:50 PM By: Lorrin Jackson Entered By: Lorrin Jackson on 03/25/2021 10:19:03 -------------------------------------------------------------------------------- HPI Details Patient Name: Date of Service: BRO A Candace Robinson, [name] 03/25/2021 9:15 A M Medical Record Number: [id number] Patient Account Number: [id number] Date of Birth/Sex: Treating RN: 1966-04-28 (55 y.o. Female) Candace Robinson Primary Care Provider: [name] Other Clinician: Referring Provider: Treating Provider/Extender: [name] Weeks in Treatment:  4 History of Present Illness HPI Description: Admission 8/22 [name] is a 55 year old female with a past medical history of diet-controlled type 2 diabetes, hypothyroidism, chronic diastolic heart failure, cutaneous lupus erythematosus and essential hypertension that presents to the clinic for a 79-month history of worsening bilateral feet ulcers. She states that wounds to her feet have been an ongoing issue for several years. She states that with wound care they improve however they tend to wax and wane. 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 She was discharged to a nursing facility and she was provided wound care for her bilateral feet ulcers at that time. She states they used Silvadene and silver alginate. She states that when she was discharged in April from the nursing facility her wounds had improved but were not fully closed. She currently uses Silvadene but has not been using silver alginate for several months due to running out. She reports significant decline in her wound healing over the last 4 months. She reports pain to the wound sites. She currently denies increased warmth erythema or purulent drainage. 8/29; patient presents  for 1 week follow-up. She states she had ABIs completed without TBI's. She continues to use silver alginate with dressing changes. She reports continued pain. She denies signs of infection. 9/8; patient presents for 1 week follow-up. She was evaluated by Dr. Donzetta Matters and she reports follow-up with him in 1 month. She has been using Santyl to the wound beds. She reports some improvement in wound healing. She denies signs of infection. 9/15; patient presents for follow-up. She continues to use Santyl and silver alginate to the wound beds. She states the alginate is sticking and hard to remove with dressing changes. She currently denies signs of infection. 9/22; patient presents for 1 week follow-up. She is using Santyl and silver alginate to the wound beds. She ran out of [place] and has been using Silvadene. She states that gentamicin ointment is ready at her pharmacy and will be picking this up today. She has no issues or complaints today. She denies signs of infection. Electronic Signature(s) Signed: 03/25/2021 10:38:25 AM By: Kalman Shan DO Entered By: Kalman Shan on 03/25/2021 10:29:33 -------------------------------------------------------------------------------- Physical Exam Details Patient Name: Date of Service: [name], [name] 03/25/2021 9:15 A M Medical Record Number: [id number] Patient Account Number: [id number] Date of Birth/Sex: Treating RN: [birth year] (55 y.o. Female) Candace Robinson Primary Care Provider: [name] Other Clinician: Referring Provider: Treating Provider/Extender: [name] Weeks in Treatment: 4 Constitutional respirations regular, non-labored and within target range for patient.Marland Kitchen Psychiatric pleasant and cooperative. Notes Wounds throughout feet bilaterally with nonviable tissue and granulation tissue present. Mild odor. All wounds appear improved since last clinic visit. Electronic Signature(s) Signed:  03/25/2021 10:38:25 AM By: Kalman Shan DO Entered By: Kalman Shan on 03/25/2021 10:32:16 -------------------------------------------------------------------------------- Physician Orders Details Patient Name: Date of Service: BRO A Candace Robinson, [name] 03/25/2021 9:15 A M Medical Record Number: [id number] Patient Account Number: [id number] Date of Birth/Sex: Treating RN: Nov 24, 1965 (55 y.o. 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 - with Dr. Dellia Nims (Dr. Heber [place] not here) Return A Other: - [facility]=Supplies Bathing/ Shower/ Hygiene May shower and wash wound with soap and water. - when dressings changed Edema Control - Lymphedema / SCD / Other Elevate legs to the level of the heart or above for 30 minutes daily and/or when sitting, a frequency of: Avoid standing for long periods of time. Additional Orders / Instructions Follow Nutritious Diet - 100-120g of protein daily Other: - Begin taking [place] [facility] to [place] for wound care. May utilize formulary equivalent dressing for wound treatment orders unless otherwise specified. - A 02/22/21: Will submit order to [facility] agencies Wound Treatment Wound #1 - Ankle Wound Laterality: Right, Medial Cleanser: Soap  and Water 1 x Per Day/30 Days Discharge Instructions: May shower and wash wound with dial antibacterial soap and water prior to dressing change. Cleanser: Wound Cleanser 1 x Per Day/30 Days Discharge Instructions: Cleanse the wound with wound cleanser prior to applying a clean dressing using gauze sponges, not tissue or cotton balls. Topical: Silvadene Cream 1 x Per Day/30 Days Discharge Instructions: Apply thin layer to wound bed only Prim Dressing: KerraCel Ag Gelling Fiber Dressing, 4x5 in (silver alginate) 1 x Per Day/30 Days ary Discharge Instructions: Apply silver alginate to wound bed as instructed Prim Dressing: Santyl Ointment 1 x Per Day/30 Days ary Discharge Instructions: Apply nickel thick amount to wound bed as instructed Secondary Dressing: Woven Gauze Sponge, Non-Sterile 4x4 in 1 x Per Day/30 Days Discharge Instructions: Apply over primary dressing as directed. Secondary Dressing: ABD Pad, 8x10 1 x Per Day/30 Days Discharge Instructions: Apply over primary dressing as directed. Secured With: Elastic Bandage 4 inch (ACE bandage) 1 x Per Day/30 Days Discharge Instructions: Secure with ACE bandage as directed. Secured With: [employer], 4.5x3.1 (in/yd) 1 x Per Day/30 Days Discharge Instructions: Secure with Kerlix as directed. Secured With: Transpore Surgical Tape, 2x10 (in/yd) 1 x Per Day/30 Days Discharge Instructions: Secure dressing with tape as directed. Wound #2 - Foot Wound Laterality: Dorsal, Right Cleanser: Soap and Water 1 x Per Day/30 Days Discharge Instructions: May shower and wash wound with dial antibacterial soap and water prior to dressing change. Cleanser: Wound Cleanser 1 x Per Day/30 Days Discharge Instructions: Cleanse the wound with wound cleanser prior to applying a clean dressing using gauze sponges, not tissue or cotton balls. Topical: Silvadene Cream 1 x Per Day/30 Days Discharge Instructions: Apply thin layer to wound bed only Prim Dressing:  KerraCel Ag Gelling Fiber Dressing, 4x5 in (silver alginate) 1 x Per Day/30 Days ary Discharge Instructions: Apply silver alginate to wound bed as instructed Secondary Dressing: Woven Gauze Sponge, Non-Sterile 4x4 in 1 x Per Day/30 Days Discharge Instructions: Apply over primary dressing as directed. Secondary Dressing: ABD Pad, 8x10 1 x Per Day/30 Days Discharge Instructions: Apply over primary dressing as directed. Secured With: Elastic Bandage 4 inch (ACE bandage) 1 x Per Day/30 Days Discharge Instructions: Secure with ACE bandage as directed. Secured With: [employer], 4.5x3.1 (in/yd) 1 x Per Day/30 Days Discharge Instructions: Secure with Kerlix as directed. Secured With: Transpore Surgical Tape, 2x10 (in/yd) 1 x Per Day/30 Days Discharge Instructions: Secure dressing with tape as directed. Wound #3 - Ankle Wound Laterality: Left, Medial Cleanser: Soap and Water 1 x Per Day/30 Days Discharge Instructions: May shower and  wash wound with dial antibacterial soap and water prior to dressing change. Cleanser: Wound Cleanser 1 x Per Day/30 Days Discharge Instructions: Cleanse the wound with wound cleanser prior to applying a clean dressing using gauze sponges, not tissue or cotton balls. Topical: Silvadene Cream 1 x Per Day/30 Days Discharge Instructions: Apply thin layer to wound bed only Prim Dressing: KerraCel Ag Gelling Fiber Dressing, 4x5 in (silver alginate) 1 x Per Day/30 Days ary Discharge Instructions: Apply silver alginate to wound bed as instructed Secondary Dressing: Woven Gauze Sponge, Non-Sterile 4x4 in 1 x Per Day/30 Days Discharge Instructions: Apply over primary dressing as directed. Secondary Dressing: ABD Pad, 8x10 1 x Per Day/30 Days Discharge Instructions: Apply over primary dressing as directed. Secured With: Elastic Bandage 4 inch (ACE bandage) 1 x Per Day/30 Days Discharge Instructions: Secure with ACE bandage as directed. Secured With: [employer],  4.5x3.1 (in/yd) 1 x Per Day/30 Days Discharge Instructions: Secure with Kerlix as directed. Secured With: Transpore Surgical Tape, 2x10 (in/yd) 1 x Per Day/30 Days Discharge Instructions: Secure dressing with tape as directed. Wound #4 - Ankle Wound Laterality: Left, Lateral Cleanser: Soap and Water 1 x Per Day/30 Days Discharge Instructions: May shower and wash wound with dial antibacterial soap and water prior to dressing change. Cleanser: Wound Cleanser 1 x Per Day/30 Days Discharge Instructions: Cleanse the wound with wound cleanser prior to applying a clean dressing using gauze sponges, not tissue or cotton balls. Topical: Silvadene Cream 1 x Per Day/30 Days Discharge Instructions: Apply thin layer to wound bed only Prim Dressing: KerraCel Ag Gelling Fiber Dressing, 4x5 in (silver alginate) 1 x Per Day/30 Days ary Discharge Instructions: Apply silver alginate to wound bed as instructed Secondary Dressing: Woven Gauze Sponge, Non-Sterile 4x4 in 1 x Per Day/30 Days Discharge Instructions: Apply over primary dressing as directed. Secondary Dressing: ABD Pad, 8x10 1 x Per Day/30 Days Discharge Instructions: Apply over primary dressing as directed. Secured With: Elastic Bandage 4 inch (ACE bandage) 1 x Per Day/30 Days Discharge Instructions: Secure with ACE bandage as directed. Secured With: [employer], 4.5x3.1 (in/yd) 1 x Per Day/30 Days Discharge Instructions: Secure with Kerlix as directed. Secured With: Transpore Surgical Tape, 2x10 (in/yd) 1 x Per Day/30 Days Discharge Instructions: Secure dressing with tape as directed. Wound #5 - Foot Wound Laterality: Dorsal, Left Cleanser: Soap and Water 1 x Per Day/30 Days Discharge Instructions: May shower and wash wound with dial antibacterial soap and water prior to dressing change. Cleanser: Wound Cleanser 1 x Per Day/30 Days Discharge Instructions: Cleanse the wound with wound cleanser prior to applying a clean dressing using gauze  sponges, not tissue or cotton balls. Topical: Silvadene Cream 1 x Per Day/30 Days Discharge Instructions: Apply thin layer to wound bed only Prim Dressing: KerraCel Ag Gelling Fiber Dressing, 4x5 in (silver alginate) 1 x Per Day/30 Days ary Discharge Instructions: Apply silver alginate to wound bed as instructed Prim Dressing: Santyl Ointment 1 x Per Day/30 Days ary Discharge Instructions: Apply nickel thick amount to wound bed as instructed Secondary Dressing: Woven Gauze Sponge, Non-Sterile 4x4 in 1 x Per Day/30 Days Discharge Instructions: Apply over primary dressing as directed. Secondary Dressing: ABD Pad, 8x10 1 x Per Day/30 Days Discharge Instructions: Apply over primary dressing as directed. Secured With: Elastic Bandage 4 inch (ACE bandage) 1 x Per Day/30 Days Discharge Instructions: Secure with ACE bandage as directed. Secured With: [employer], 4.5x3.1 (in/yd) 1 x Per Day/30 Days Discharge Instructions: Secure with Kerlix  as directed. Secured With: Transpore Surgical Tape, 2x10 (in/yd) 1 x Per Day/30 Days Discharge Instructions: Secure dressing with tape as directed. Wound #6 - Foot Wound Laterality: Left, Medial, Distal Cleanser: Soap and Water 1 x Per Day/30 Days Discharge Instructions: May shower and wash wound with dial antibacterial soap and water prior to dressing change. Cleanser: Wound Cleanser 1 x Per Day/30 Days Discharge Instructions: Cleanse the wound with wound cleanser prior to applying a clean dressing using gauze sponges, not tissue or cotton balls. Topical: Silvadene Cream 1 x Per Day/30 Days Discharge Instructions: Apply thin layer to wound bed only Prim Dressing: KerraCel Ag Gelling Fiber Dressing, 4x5 in (silver alginate) 1 x Per Day/30 Days ary Discharge Instructions: Apply silver alginate to wound bed as instructed Secondary Dressing: Woven Gauze Sponge, Non-Sterile 4x4 in 1 x Per Day/30 Days Discharge Instructions: Apply over primary dressing as  directed. Secondary Dressing: ABD Pad, 8x10 1 x Per Day/30 Days Discharge Instructions: Apply over primary dressing as directed. Secured With: Elastic Bandage 4 inch (ACE bandage) 1 x Per Day/30 Days Discharge Instructions: Secure with ACE bandage as directed. Secured With: [employer], 4.5x3.1 (in/yd) 1 x Per Day/30 Days Discharge Instructions: Secure with Kerlix as directed. Secured With: Transpore Surgical Tape, 2x10 (in/yd) 1 x Per Day/30 Days Discharge Instructions: Secure dressing with tape as directed. Electronic Signature(s) Signed: 03/25/2021 4:57:50 PM By: Lorrin Jackson Signed: 03/26/2021 9:21:41 AM By: Kalman Shan DO Previous Signature: 03/25/2021 10:38:25 AM Version By: Kalman Shan DO Entered By: Lorrin Jackson on 03/25/2021 16:36:28 -------------------------------------------------------------------------------- Problem List Details Patient Name: Date of Service: [name], [name] 03/25/2021 9:15 A M Medical Record Number: [id number] Patient Account Number: [id number] Date of Birth/Sex: Treating RN: [birth year] (55 y.o. 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 02/22/2021 No Yes L97.529 Non-pressure chronic ulcer of other part of left foot with unspecified severity 02/22/2021 No Yes I73.9 Peripheral vascular disease, unspecified 02/22/2021 No Yes E11.9 Type 2 diabetes mellitus without complications 1/76/1607 No Yes L93.2 Other local lupus erythematosus 02/22/2021 No Yes P71.06 Chronic diastolic (congestive) heart failure 02/22/2021 No Yes I10 Essential (primary) hypertension 02/22/2021 No Yes E11.621 Type 2 diabetes mellitus with foot ulcer  02/22/2021 No Yes Inactive Problems Resolved Problems Electronic Signature(s) Signed: 03/25/2021 10:38:25 AM By: Kalman Shan DO Entered By: Kalman Shan on 03/25/2021 10:28:18 -------------------------------------------------------------------------------- Progress Note Details Patient Name: Date of Service: BRO A Candace Robinson, [name] 03/25/2021 9:15 A M Medical Record Number: [id number] Patient Account Number: [id number] Date of Birth/Sex: Treating RN: 1965/07/30 (55 y.o. Female) Candace Robinson Primary Care Provider: [name] Other Clinician: Referring Provider: Treating Provider/Extender: [name] Weeks in Treatment: 4 Subjective Chief Complaint Information obtained from Patient Bilateral feet wounds History of Present Illness (HPI) Admission 8/22 Ms. P[place]ola Dimmitt is a 55 year old female with a past medical history of diet-controlled type 2 diabetes, hypothyroidism, chronic diastolic heart failure, cutaneous lupus erythematosus and essential hypertension that presents to the clinic for a 71-month history of worsening bilateral feet ulcers. She states that wounds to her feet have been an ongoing issue for several years. She states that with wound care they improve however they tend to wax and wane. 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  She was discharged to a nursing facility and she was provided wound care for her bilateral feet ulcers at that time. She states they used Silvadene and silver alginate. She states that when she was discharged in April from the nursing facility her wounds had improved but were not fully closed. She currently uses Silvadene but has not been using silver alginate for several months due to running out. She reports significant decline in her wound healing over the last 4 months. She reports pain to the wound sites. She currently denies increased warmth erythema or purulent  drainage. 8/29; patient presents for 1 week follow-up. She states she had ABIs completed without TBI's. She continues to use silver alginate with dressing changes. She reports continued pain. She denies signs of infection. 9/8; patient presents for 1 week follow-up. She was evaluated by Dr. Donzetta Matters and she reports follow-up with him in 1 month. She has been using Santyl to the wound beds. She reports some improvement in wound healing. She denies signs of infection. 9/15; patient presents for follow-up. She continues to use Santyl and silver alginate to the wound beds. She states the alginate is sticking and hard to remove with dressing changes. She currently denies signs of infection. 9/22; patient presents for 1 week follow-up. She is using Santyl and silver alginate to the wound beds. She ran out of [place] and has been using Silvadene. She states that gentamicin ointment is ready at her pharmacy and will be picking this up today. She has no issues or complaints today. She denies signs of infection. Patient History Information obtained from Patient. Family History Unknown History. Social History Never smoker, Marital Status - Single, Alcohol Use - Never, Drug Use - No History, Caffeine Use - Rarely. Medical History Hematologic/Lymphatic Patient has history of Anemia Cardiovascular Patient has history of Arrhythmia - A-Fib, Congestive Heart Failure, Hypertension Denies history of Angina Endocrine Patient has history of Type II Diabetes Immunological Patient has history of Lupus Erythematosus Medical A Surgical History Notes nd Endocrine Grave's disease Objective Constitutional respirations regular, non-labored and within target range for patient.. Vitals Time Taken: 9:36 AM, Height: 63 in, Weight: 157 lbs, BMI: 27.8, Temperature: 98.2 F, Pulse: 58 bpm, Respiratory Rate: 18 breaths/min, Blood Pressure: 143/76 mmHg. Psychiatric pleasant and cooperative. General Notes: Wounds  throughout feet bilaterally with nonviable tissue and granulation tissue present. Mild odor. All wounds appear improved since last clinic visit. Integumentary (Hair, Skin) Wound #1 status is Open. Original cause of wound was Gradually Appeared. The date acquired was: 04/03/2020. The wound has been in treatment 4 weeks. The wound is located on the Right,Medial Ankle. The wound measures 5.4cm length x 4.3cm width x 0.2cm depth; 18.237cm^2 area and 3.647cm^3 volume. There is Fat Layer (Subcutaneous Tissue) exposed. There is no tunneling or undermining noted. There is a medium amount of serosanguineous drainage noted. The wound margin is distinct with the outline attached to the wound base. There is small (1-33%) red granulation within the wound bed. There is a large (67-100%) amount of necrotic tissue within the wound bed including Adherent Slough. Wound #2 status is Open. Original cause of wound was Gradually Appeared. The date acquired was: 04/03/2020. The wound has been in treatment 4 weeks. The wound is located on the Right,Dorsal Foot. The wound measures 6.5cm length x 5cm width x 0.1cm depth; 25.525cm^2 area and 2.553cm^3 volume. There is Fat Layer (Subcutaneous Tissue) exposed. There is no tunneling or undermining noted. There is a medium amount of serosanguineous drainage noted. The  wound margin is distinct with the outline attached to the wound base. There is medium (34-66%) red granulation within the wound bed. There is a medium (34- 66%) amount of necrotic tissue within the wound bed including Adherent Slough. Wound #3 status is Open. Original cause of wound was Gradually Appeared. The date acquired was: 02/22/2021. The wound has been in treatment 4 weeks. The wound is located on the Left,Medial Ankle. The wound measures 1.5cm length x 1.8cm width x 0.1cm depth; 2.121cm^2 area and 0.212cm^3 volume. There is Fat Layer (Subcutaneous Tissue) exposed. There is no tunneling or undermining noted. There  is a medium amount of serosanguineous drainage noted. The wound margin is distinct with the outline attached to the wound base. There is medium (34-66%) red granulation within the wound bed. There is a medium (34- 66%) amount of necrotic tissue within the wound bed including Adherent Slough. Wound #4 status is Open. Original cause of wound was Gradually Appeared. The date acquired was: 04/03/2020. The wound has been in treatment 4 weeks. The wound is located on the Left,Lateral Ankle. The wound measures 8cm length x 5cm width x 0.1cm depth; 31.416cm^2 area and 3.142cm^3 volume. There is Fat Layer (Subcutaneous Tissue) exposed. There is no tunneling or undermining noted. There is a medium amount of serosanguineous drainage noted. The wound margin is distinct with the outline attached to the wound base. There is medium (34-66%) red granulation within the wound bed. There is a medium (34-66%) amount of necrotic tissue within the wound bed including Adherent Slough. Wound #5 status is Open. Original cause of wound was Gradually Appeared. The date acquired was: 04/03/2020. The wound has been in treatment 4 weeks. The wound is located on the Left,Dorsal Foot. The wound measures 9cm length x 9.5cm width x 0.2cm depth; 67.152cm^2 area and 13.43cm^3 volume. There is Fat Layer (Subcutaneous Tissue) exposed. There is a medium amount of serosanguineous drainage noted. The wound margin is distinct with the outline attached to the wound base. There is small (1-33%) red granulation within the wound bed. There is a large (67-100%) amount of necrotic tissue within the wound bed including Adherent Slough. Wound #6 status is Open. Original cause of wound was Gradually Appeared. The date acquired was: 04/03/2020. The wound has been in treatment 4 weeks. The wound is located on the Left,Distal,Medial Foot. The wound measures 0.3cm length x 0.3cm width x 0.1cm depth; 0.071cm^2 area and 0.007cm^3 volume. There is Fat Layer  (Subcutaneous Tissue) exposed. There is no tunneling or undermining noted. There is a medium amount of serosanguineous drainage noted. The wound margin is distinct with the outline attached to the wound base. There is medium (34-66%) red, pink granulation within the wound bed. There is a medium (34-66%) amount of necrotic tissue within the wound bed including Adherent Slough. 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 She had a wound culture at last clinic visit that grew Morganella morganii and Proteus mirabilis sensitive to gentamicin. I do not think she needs oral antibiotics but would benefit from a topical antibiotic ointment. Gentamicin was prescribed to the patient and she states she will pick this up today. I was able to debride  a lot of nonviable tissue today. I recommended continuing Santyl to the areas of slough, gentamicin to all wounds and silver alginate with dressing changes. I asked patient to bring in Santyl and gentamicin at next clinic visit for application. Procedures Wound #1 Pre-procedure diagnosis of Wound #1 is a Diabetic Wound/Ulcer of the Lower Extremity located on the Right,Medial Ankle .Severity of Tissue Pre Debridement is: Fat layer exposed. There was a Selective/Open Wound Non-Viable Tissue Debridement with a total area of 12 sq cm performed by Kalman Shan, DO. With the following instrument(s): Curette to remove Non-Viable tissue/material. Material removed includes Slough after achieving pain control using Lidocaine 4%  T opical Solution. No specimens were taken. A time out was conducted at 10:11, prior to the start of the procedure. A Minimum amount of bleeding was controlled with Pressure. The procedure was tolerated well. Post Debridement Measurements: 5.4cm length x 4.3cm width x 0.2cm depth; 3.647cm^3 volume. Character of Wound/Ulcer Post Debridement is stable. Severity of Tissue Post Debridement is: Fat layer exposed. Post procedure Diagnosis Wound #1: Same as Pre-Procedure Wound #5 Pre-procedure diagnosis of Wound #5 is a Diabetic Wound/Ulcer of the Lower Extremity located on the Left,Dorsal Foot .Severity of Tissue Pre Debridement is: Fat layer exposed. There was a Selective/Open Wound Non-Viable Tissue Debridement with a total area of 16 sq cm performed by Kalman Shan, DO. With the following instrument(s): Curette to remove Non-Viable tissue/material. Material removed includes Slough after achieving pain control using Lidocaine 4% T opical Solution. No specimens were taken. A time out was conducted at 10:11, prior to the start of the procedure. A Minimum amount of bleeding was controlled with Pressure. The procedure was tolerated well. Post Debridement Measurements: 9cm length x 9.5cm width x 0.2cm depth; 13.43cm^3 volume. Character of Wound/Ulcer Post Debridement is stable. Severity of Tissue Post Debridement is: Fat layer exposed. Post procedure Diagnosis Wound #5: Same as Pre-Procedure Plan Follow-up Appointments: Return Appointment in 1 week. - with Dr. Dellia Nims (Dr. Heber Samoa not here) Other: - [facility]=Supplies Bathing/ Shower/ Hygiene: May shower and wash wound with soap and water. - when dressings changed Edema Control - Lymphedema / SCD / Other: Elevate legs to the level of the heart or above for 30 minutes daily and/or when sitting, a frequency of: Avoid standing for long periods of time. Additional Orders / Instructions: Follow Nutritious Diet - 100-120g of protein daily Home  Health: Admit to [facility] for wound care. May utilize formulary equivalent dressing for wound treatment orders unless otherwise specified. - 02/22/21: Will submit order to [facility] #1: - Ankle Wound Laterality: Right, Medial Cleanser: Soap and Water 1 x Per Day/30 Days Discharge Instructions: May shower and wash wound with dial antibacterial soap and water prior to dressing change. Cleanser: Wound Cleanser 1 x Per Day/30 Days Discharge Instructions: Cleanse the wound with wound cleanser prior to applying a clean dressing using gauze sponges, not tissue or cotton balls. Topical: Gentamicin 1 x Per Day/30 Days Discharge Instructions: As directed by physician Prim Dressing: KerraCel Ag Gelling Fiber Dressing, 4x5 in (silver alginate) 1 x Per Day/30 Days ary Discharge Instructions: Apply silver alginate to wound bed as instructed Prim Dressing: Santyl Ointment 1 x Per Day/30 Days ary Discharge Instructions: Apply nickel thick amount to wound bed as instructed Secondary Dressing: Woven Gauze Sponge, Non-Sterile 4x4 in 1 x Per Day/30 Days Discharge Instructions: Apply over primary dressing as directed. Secondary Dressing: ABD Pad, 8x10 1 x Per Day/30 Days Discharge Instructions: Apply over primary  dressing as directed. Secured With: Elastic Bandage 4 inch (ACE bandage) 1 x Per Day/30 Days Discharge Instructions: Secure with ACE bandage as directed. Secured With: [employer], 4.5x3.1 (in/yd) 1 x Per Day/30 Days Discharge Instructions: Secure with Kerlix as directed. Secured With: Trans[employer]ore [employer] T ape, 2x10 (in/yd) 1 x Per Day/30 Days Discharge Instructions: Secure dressing with tape as directed. WOUND #2: - Foot Wound Laterality: Dorsal, Right Cleanser: Soap and Water 1 x Per Day/30 Days Discharge Instructions: May shower and wash wound with dial antibacterial soap and water prior to dressing change. Cleanser: Wound Cleanser 1 x Per Day/30 Days Discharge Instructions:  Cleanse the wound with wound cleanser prior to applying a clean dressing using gauze sponges, not tissue or cotton balls. Topical: Gentamicin 1 x Per Day/30 Days Discharge Instructions: As directed by physician Prim Dressing: KerraCel Ag Gelling Fiber Dressing, 4x5 in (silver alginate) 1 x Per Day/30 Days ary Discharge Instructions: Apply silver alginate to wound bed as instructed Secondary Dressing: Woven Gauze Sponge, Non-Sterile 4x4 in 1 x Per Day/30 Days Discharge Instructions: Apply over primary dressing as directed. Secondary Dressing: ABD Pad, 8x10 1 x Per Day/30 Days Discharge Instructions: Apply over primary dressing as directed. Secured With: Elastic Bandage 4 inch (ACE bandage) 1 x Per Day/30 Days Discharge Instructions: Secure with ACE bandage as directed. Secured With: [employer], 4.5x3.1 (in/yd) 1 x Per Day/30 Days Discharge Instructions: Secure with Kerlix as directed. Secured With: [employer] T ape, 2x10 (in/yd) 1 x Per Day/30 Days Discharge Instructions: Secure dressing with tape as directed. WOUND #3: - Ankle Wound Laterality: Left, Medial Cleanser: Soap and Water 1 x Per Day/30 Days Discharge Instructions: May shower and wash wound with dial antibacterial soap and water prior to dressing change. Cleanser: Wound Cleanser 1 x Per Day/30 Days Discharge Instructions: Cleanse the wound with wound cleanser prior to applying a clean dressing using gauze sponges, not tissue or cotton balls. Topical: Gentamicin 1 x Per Day/30 Days Discharge Instructions: As directed by physician Prim Dressing: KerraCel Ag Gelling Fiber Dressing, 4x5 in (silver alginate) 1 x Per Day/30 Days ary Discharge Instructions: Apply silver alginate to wound bed as instructed Secondary Dressing: Woven Gauze Sponge, Non-Sterile 4x4 in 1 x Per Day/30 Days Discharge Instructions: Apply over primary dressing as directed. Secondary Dressing: ABD Pad, 8x10 1 x Per Day/30 Days Discharge  Instructions: Apply over primary dressing as directed. Secured With: Elastic Bandage 4 inch (ACE bandage) 1 x Per Day/30 Days Discharge Instructions: Secure with ACE bandage as directed. Secured With: [employer], 4.5x3.1 (in/yd) 1 x Per Day/30 Days Discharge Instructions: Secure with Kerlix as directed. Secured With: [employer] Surgical T ape, 2x10 (in/yd) 1 x Per Day/30 Days Discharge Instructions: Secure dressing with tape as directed. WOUND #4: - Ankle Wound Laterality: Left, Lateral Cleanser: Soap and Water 1 x Per Day/30 Days Discharge Instructions: May shower and wash wound with dial antibacterial soap and water prior to dressing change. Cleanser: Wound Cleanser 1 x Per Day/30 Days Discharge Instructions: Cleanse the wound with wound cleanser prior to applying a clean dressing using gauze sponges, not tissue or cotton balls. Topical: Gentamicin 1 x Per Day/30 Days Discharge Instructions: As directed by physician Prim Dressing: KerraCel Ag Gelling Fiber Dressing, 4x5 in (silver alginate) 1 x Per Day/30 Days ary Discharge Instructions: Apply silver alginate to wound bed as instructed Secondary Dressing: Woven Gauze Sponge, Non-Sterile 4x4 in 1 x Per Day/30 Days Discharge Instructions: Apply over primary dressing as directed. Secondary  Dressing: ABD Pad, 8x10 1 x Per Day/30 Days Discharge Instructions: Apply over primary dressing as directed. Secured With: Elastic Bandage 4 inch (ACE bandage) 1 x Per Day/30 Days Discharge Instructions: Secure with ACE bandage as directed. Secured With: [employer], 4.5x3.1 (in/yd) 1 x Per Day/30 Days Discharge Instructions: Secure with Kerlix as directed. Secured With: [employer] Surgical T ape, 2x10 (in/yd) 1 x Per Day/30 Days Discharge Instructions: Secure dressing with tape as directed. WOUND #5: - Foot Wound Laterality: Dorsal, Left Cleanser: Soap and Water 1 x Per Day/30 Days Discharge Instructions: May shower and wash wound with  dial antibacterial soap and water prior to dressing change. Cleanser: Wound Cleanser 1 x Per Day/30 Days Discharge Instructions: Cleanse the wound with wound cleanser prior to applying a clean dressing using gauze sponges, not tissue or cotton balls. Topical: Gentamicin 1 x Per Day/30 Days Discharge Instructions: As directed by physician Prim Dressing: KerraCel Ag Gelling Fiber Dressing, 4x5 in (silver alginate) 1 x Per Day/30 Days ary Discharge Instructions: Apply silver alginate to wound bed as instructed Prim Dressing: Santyl Ointment 1 x Per Day/30 Days ary Discharge Instructions: Apply nickel thick amount to wound bed as instructed Secondary Dressing: Woven Gauze Sponge, Non-Sterile 4x4 in 1 x Per Day/30 Days Discharge Instructions: Apply over primary dressing as directed. Secondary Dressing: ABD Pad, 8x10 1 x Per Day/30 Days Discharge Instructions: Apply over primary dressing as directed. Secured With: Elastic Bandage 4 inch (ACE bandage) 1 x Per Day/30 Days Discharge Instructions: Secure with ACE bandage as directed. Secured With: [employer], 4.5x3.1 (in/yd) 1 x Per Day/30 Days Discharge Instructions: Secure with Kerlix as directed. Secured With: [employer] T ape, 2x10 (in/yd) 1 x Per Day/30 Days Discharge Instructions: Secure dressing with tape as directed. WOUND #6: - Foot Wound Laterality: Left, Medial, Distal Cleanser: Soap and Water 1 x Per Day/30 Days Discharge Instructions: May shower and wash wound with dial antibacterial soap and water prior to dressing change. Cleanser: Wound Cleanser 1 x Per Day/30 Days Discharge Instructions: Cleanse the wound with wound cleanser prior to applying a clean dressing using gauze sponges, not tissue or cotton balls. Topical: Gentamicin 1 x Per Day/30 Days Discharge Instructions: As directed by physician Prim Dressing: KerraCel Ag Gelling Fiber Dressing, 4x5 in (silver alginate) 1 x Per Day/30 Days ary Discharge  Instructions: Apply silver alginate to wound bed as instructed Secondary Dressing: Woven Gauze Sponge, Non-Sterile 4x4 in 1 x Per Day/30 Days Discharge Instructions: Apply over primary dressing as directed. Secondary Dressing: ABD Pad, 8x10 1 x Per Day/30 Days Discharge Instructions: Apply over primary dressing as directed. Secured With: Elastic Bandage 4 inch (ACE bandage) 1 x Per Day/30 Days Discharge Instructions: Secure with ACE bandage as directed. Secured With: [employer], 4.5x3.1 (in/yd) 1 x Per Day/30 Days Discharge Instructions: Secure with Kerlix as directed. Secured With: [employer] T ape, 2x10 (in/yd) 1 x Per Day/30 Days Discharge Instructions: Secure dressing with tape as directed. 1. Santyl, gentamicin and silver alginate 2. In office sharp debridement 3. Follow-up in 1 week 4. Patient may benefit from the moleculight trial next week with Dr. Dellia Nims Electronic Signature(s) Signed: 03/25/2021 10:38:25 AM By: Kalman Shan DO Entered By: Kalman Shan on 03/25/2021 10:37:31 -------------------------------------------------------------------------------- HxROS Details Patient Name: Date of Service: BRO A Candace Robinson, [name] 03/25/2021 9:15 A M Medical Record Number: [id number] Patient Account Number: [id number] Date of Birth/Sex: Treating RN: Nov 10, 1965 (55 y.o. Female) Candace Robinson Primary Care Provider: [name]  Other Clinician: Referring Provider: Treating Provider/Extender: Darcey Nora in Treatment: 4 Information Obtained From Patient Hematologic/Lymphatic Medical History: Positive for: Anemia Cardiovascular Medical History: Positive for: Arrhythmia - A-Fib; Congestive Heart Failure; Hypertension Negative for: Angina Endocrine Medical History: Positive for: Type II Diabetes Past Medical History Notes: Grave's disease Time with diabetes: over 10 years Treated with: Diet Blood sugar tested every  day: No Immunological Medical History: Positive for: Lupus Erythematosus Immunizations Pneumococcal Vaccine: Received Pneumococcal Vaccination: No Implantable Devices None Family and Social History Unknown History: Yes; Never smoker; Marital Status - Single; Alcohol Use: Never; Drug Use: No History; Caffeine Use: Rarely; Financial Concerns: No; Food, Clothing or Shelter Needs: No; Support System Lacking: No; Transportation Concerns: No Electronic Signature(s) Signed: 03/25/2021 10:38:25 AM By: Kalman Shan DO Signed: 03/31/2021 5:21:18 PM By: Candace Hammock RN Entered By: Kalman Shan on 03/25/2021 10:29:40 -------------------------------------------------------------------------------- SuperBill Details Patient Name: Date of Service: BRO A Candace Robinson, [name] 03/25/2021 Medical Record Number: [id number] Patient Account Number: [id number] Date of Birth/Sex: Treating RN: Apr 30, 1966 (55 y.o. 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 I73.9 Peripheral vascular disease, unspecified E11.9 Type 2 diabetes mellitus without complications N47.0 Other local lupus erythematosus J62.83 Chronic diastolic (congestive) heart failure I10 Essential (primary) hypertension E11.621 Type 2 diabetes mellitus with foot ulcer Facility Procedures CPT4 Code: [id number] Description: [phone] - DEBRIDE WOUND 1ST 20 SQ CM OR < ICD-10 Diagnosis Description L97.519 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 L97.529 Non-pressure chronic ulcer of other part of left foot with unspecified   severity Modifier: Quantity: 1 CPT4 Code: [id number] Description: 75170 - DEBRIDE WOUND EA ADDL 20 SQ CM ICD-10 Diagnosis Description L97.519 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 L97.529 Non-pressure chronic ulcer of other part of left foot with unspecified  severity Modifier: Quantity: 1 Physician Procedures : CPT4 Code Description Modifier [id number] - WC PHYS DEBR WO ANESTH 20 SQ CM ICD-10 Diagnosis Description L97.519 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

## 2021-04-02 ENCOUNTER — Other Ambulatory Visit: Payer: Self-pay

## 2021-04-02 ENCOUNTER — Encounter (HOSPITAL_BASED_OUTPATIENT_CLINIC_OR_DEPARTMENT_OTHER): Payer: Medicaid Other | Admitting: Internal Medicine

## 2021-04-02 DIAGNOSIS — E11621 Type 2 diabetes mellitus with foot ulcer: Secondary | ICD-10-CM | POA: Diagnosis not present

## 2021-04-02 NOTE — Progress Notes (Signed)
Candace Robinson, Candace Robinson ([id number]) Visit Report for 04/02/2021 Arrival Information Details Patient Name: Date of Service: [name], [name] 04/02/2021 10:00 A M Medical Record Number: [id number] Patient Account Number: [id number] Date of Birth/Sex: Treating RN: 01/07/1966 (55 y.o. Candace Robinson, Candace Robinson Primary Care Monserrat Vidaurri: [name] Other Clinician: Referring Welford Christmas: Treating Almendra Loria/Extender: Debbra Riding in Treatment: 5 Visit Information History Since Last Visit Added or deleted any medications: No Patient Arrived: Candace Robinson Any new allergies or adverse reactions: No Arrival Time: 10:26 Had a fall or experienced change in No Accompanied By: alone activities of daily living that may affect Transfer Assistance: None risk of falls: Patient Identification Verified: Yes Signs or symptoms of abuse/neglect since last visito No Secondary Verification Process Completed: Yes Hospitalized since last visit: No Patient Requires Transmission-Based No Implantable device outside of the clinic excluding No Precautions: cellular tissue based products placed in the center Patient Has Alerts: Yes since last visit: Patient Alerts: R ABI: 1.0 L ABI: 1.1 Has Dressing in Place as Prescribed: Yes NO BENZOCAINE Pain Present Now: Yes USE LIDOCAINE GEL ONLY Electronic Signature(s) Signed: 04/02/2021 4:27:12 PM By: Levan Hurst RN, BSN Entered By: Levan Hurst on 04/02/2021 10:26:43 -------------------------------------------------------------------------------- Encounter Discharge Information Details Patient Name: Date of Service: [name], [name] 04/02/2021 10:00 A M Medical Record Number: [id number] Patient Account Number: [id number] Date of Birth/Sex: Treating RN: December 21, 1965 (55 y.o. Candace Robinson Primary Care Kayelyn Lemon: [name] Other Clinician: Referring Yehonatan Grandison: Treating Cash Meadow/Extender: Debbra Riding in  Treatment: 5 Encounter Discharge Information Items Post Procedure Vitals Discharge Condition: Stable Temperature (F): 98.5 Ambulatory Status: Walker Pulse (bpm): 58 Discharge Destination: Home Respiratory Rate (breaths/min): 16 Transportation: Private Auto Blood Pressure (mmHg): 126/77 Accompanied By: alone Schedule Follow-up Appointment: Yes Clinical Summary of Care: Patient Declined Electronic Signature(s) Signed: 04/02/2021 4:27:12 PM By: Levan Hurst RN, BSN Entered By: Levan Hurst on 04/02/2021 16:08:15 -------------------------------------------------------------------------------- Lower Extremity Assessment Details Patient Name: Date of Service: [name], [name] 04/02/2021 10:00 A M Medical Record Number: [id number] Patient Account Number: [id number] Date of Birth/Sex: Treating RN: 04-01-1966 (55 y.o. Candace Robinson Primary Care Jyla Hopf: [name] Other Clinician: Referring Chivas Notz: Treating Calli Bashor/Extender: Debbra Riding in Treatment: 5 Edema Assessment Assessed: Candace Robinson: No] Candace Robinson: No] Edema: [Left: Yes] [Right: Yes] Calf Left: Right: Point of Measurement: 29 cm From Medial Instep 35.5 cm 35 cm Ankle Left: Right: Point of Measurement: 10 cm From Medial Instep 24 cm 24 cm Vascular Assessment Pulses: Dorsalis Pedis Palpable: [Left:Yes] [Right:Yes] Electronic Signature(s) Signed: 04/02/2021 4:27:12 PM By: Levan Hurst RN, BSN Entered By: Levan Hurst on 04/02/2021 10:55:50 -------------------------------------------------------------------------------- Multi Wound Chart Details Patient Name: Date of Service: [name], [name] 04/02/2021 10:00 A M Medical Record Number: [id number] Patient Account Number: [id number] Date of Birth/Sex: Treating RN: July 23, 1965 (55 y.o. Candace Robinson Primary Care Nikki Rusnak: [name] Other Clinician: Referring Dilan Novosad: Treating Bryam Taborda/Extender: Debbra Riding in Treatment: 5 Vital Signs Height(in): 23 Pulse(bpm): 65 Weight(lbs): 157 Blood Pressure(mmHg): 126/77 Body Mass Index(BMI): 28 Temperature(F): 98.5 Respiratory Rate(breaths/min): 16 Photos: [1:Right, Medial Ankle] [2:Right, Dorsal Foot] [3:Left, Medial Ankle] Wound Location: [1:Gradually Appeared] [2:Gradually Appeared] [3:Gradually Appeared] Wounding Event: [1:Diabetic Wound/Ulcer of the Lower] [2:Diabetic Wound/Ulcer of the Lower] [3:Diabetic Wound/Ulcer of the Lower] Primary Etiology: [1:Extremity Anemia, Arrhythmia, Congestive Heart Anemia, Arrhythmia, Congestive Heart Anemia, Arrhythmia, Congestive Heart] [2:Extremity] [3:Extremity] Comorbid History: [1:Failure, Hypertension, Type  II Diabetes, Lupus Erythematosus 04/03/2020] [2:Failure, Hypertension, Type II Diabetes, Lupus Erythematosus 04/03/2020] [3:Failure, Hypertension, Type II Diabetes, Lupus Erythematosus 02/22/2021] Date Acquired: [1:5] [2:5] [3:5] Weeks of Treatment: [1:Open] [2:Open] [3:Open] Wound Status: [1:5.3x4.4x0.3] [2:6x5x0.1] [3:1x2.5x0.1] Measurements L x W x D (cm) [1:18.315] [2:23.562] [3:1.963] A (cm) : rea [1:5.495] [2:2.356] [3:0.196] Volume (cm) : [1:-11.00%] [2:-100.00%] [3:0.00%] % Reduction in A [1:rea: -66.60%] [2:-100.00%] [3:50.10%] % Reduction in Volume: [1:Grade 2] [2:Grade 2] [3:Grade 2] Classification: [1:Medium] [2:Medium] [3:Medium] Exudate A mount: [1:Serosanguineous] [2:Serosanguineous] [3:Serosanguineous] Exudate Type: [1:red, brown] [2:red, brown] [3:red, brown] Exudate Color: [1:Distinct, outline attached] [2:Distinct, outline attached] [3:Distinct, outline attached] Wound Margin: [1:Small (1-33%)] [2:Medium (34-66%)] [3:Medium (34-66%)] Granulation A mount: [1:Red] [2:Red] [3:Red] Granulation Quality: [1:Large (67-100%)] [2:Medium (34-66%)] [3:Medium (34-66%)] Necrotic A mount: [1:Fat Layer (Subcutaneous Tissue): Yes Fat Layer (Subcutaneous Tissue):  Yes Fat Layer (Subcutaneous Tissue): Yes] Exposed Structures: [1:Fascia: No Tendon: No Muscle: No Joint: No Bone: No None] [2:Fascia: No Tendon: No Muscle: No Joint: No Bone: No None] [3:Fascia: No Tendon: No Muscle: No Joint: No Bone: No Medium (34-66%)] Epithelialization: [1:Chemical/Enzymatic/Mechanical -] [2:Chemical/Enzymatic/Mechanical -] [3:Chemical/Enzymatic/Mechanical -] Debridement: [1:Selective/Open Wound] [2:Selective/Open Wound] [3:Selective/Open Wound] Pre-procedure Verification/Time Out 11:45 [2:11:45] [3:11:45] Taken: [1:N/A] [2:Non-Viable Tissue] [3:Non-Viable Tissue] Level: [1:N/A] [2:N/A] [3:N/A] Instrument: [1:None] [2:None] [3:None] Bleeding: [1:0] [2:0] [3:0] Procedural Pain: [1:0] [2:0] [3:0] Post Procedural Pain: [1:Procedure was tolerated well] [2:Procedure was tolerated well] [3:Procedure was tolerated well] Debridement Treatment Response: [1:5.3x4.4x0.3] [2:6x5x0.1] [3:1x2.5x0.1] Post Debridement Measurements L x W x D (cm) [1:5.495] [2:2.356] [3:0.196] Post Debridement Volume: (cm) [1:Debridement] [2:Debridement] [3:Debridement] Wound Number: 4 5 6  Photos: Left, Lateral Ankle Left, Dorsal Foot Left, Distal, Medial Foot Wound Location: Gradually Appeared Gradually Appeared Gradually Appeared Wounding Event: Diabetic Wound/Ulcer of the Lower Diabetic Wound/Ulcer of the Lower Diabetic Wound/Ulcer of the Lower Primary Etiology: Extremity Extremity Extremity Anemia, Arrhythmia, Congestive Heart Anemia, Arrhythmia, Congestive Heart Anemia, Arrhythmia, Congestive Heart Comorbid History: Failure, Hypertension, Type II Failure, Hypertension, Type II Failure, Hypertension, Type II Diabetes, Lupus Erythematosus Diabetes, Lupus Erythematosus Diabetes, Lupus Erythematosus 04/03/2020 04/03/2020 04/03/2020 Date Acquired: 5 5 5  Weeks of Treatment: Open Open Open Wound Status: 7.2x5.7x0.1 9x9.2x0.1 0.7x0.5x0.1 Measurements L x W x D (cm) 32.233 65.031 0.275 A (cm)  : rea 3.223 6.503 0.027 Volume (cm) : 14.50% -8.20% 12.40% % Reduction in A rea: 57.30% -8.20% 12.90% % Reduction in Volume: Grade 2 Grade 2 Grade 2 Classification: Medium Medium Medium Exudate A mount: Serosanguineous Serosanguineous Serosanguineous Exudate Type: red, brown red, brown red, brown Exudate Color: Distinct, outline attached Distinct, outline attached Distinct, outline attached Wound Margin: Medium (34-66%) Small (1-33%) Medium (34-66%) Granulation A mount: Red Red Red, Pink Granulation Quality: Medium (34-66%) Large (67-100%) Medium (34-66%) Necrotic A mount: Fat Layer (Subcutaneous Tissue): Yes Fat Layer (Subcutaneous Tissue): Yes Fat Layer (Subcutaneous Tissue): Yes Exposed Structures: Fascia: No Fascia: No Fascia: No Tendon: No Tendon: No Tendon: No Muscle: No Muscle: No Muscle: No Joint: No Joint: No Joint: No Bone: No Bone: No Bone: No Small (1-33%) None Medium (34-66%) Epithelialization: Chemical/Enzymatic/Mechanical - Chemical/Enzymatic/Mechanical - Chemical/Enzymatic/Mechanical - Debridement: Selective/Open Wound Selective/Open Wound Selective/Open Wound Pre-procedure Verification/Time Out 11:45 11:45 11:45 Taken: Non-Viable Tissue Non-Viable Tissue Non-Viable Tissue Level: N/A N/A N/A Instrument: None None None Bleeding: 0 0 0 Procedural Pain: 0 0 0 Post Procedural Pain: Procedure was tolerated well Procedure was tolerated well Procedure was tolerated well Debridement Treatment Response: 7.2x5.7x0.1 9x9.2x0.1 0.7x0.5x0.1 Post Debridement Measurements L x W x D (cm) 3.223 6.503 0.027 Post Debridement Volume: (cm) Debridement Debridement Debridement  Procedures Performed: Treatment Notes Electronic Signature(s) Signed: 04/02/2021 3:46:44 PM By: Linton Ham MD Signed: 04/02/2021 4:27:12 PM By: Levan Hurst RN, BSN Entered By: Linton Ham on 04/02/2021  13:00:12 -------------------------------------------------------------------------------- Multi-Disciplinary Care Plan Details Patient Name: Date of Service: [name], [name] 04/02/2021 10:00 A M Medical Record Number: [id number] Patient Account Number: [id number] Date of Birth/Sex: Treating RN: 1966/05/06 (55 y.o. Candace Robinson Primary Care Vonzell Lindblad: [name] Other Clinician: Referring Tiyon Sanor: Treating Adasyn Mcadams/Extender: Debbra Riding in Treatment: 5 Active Inactive Wound/Skin Impairment Nursing Diagnoses: Impaired tissue integrity Goals: Patient/caregiver will verbalize understanding of skin care regimen Date Initiated: 02/22/2021 Target Resolution Date: 04/29/2021 Goal Status: Active Ulcer/skin breakdown will have a volume reduction of 30% by week 4 Date Initiated: 02/22/2021 Target Resolution Date: 04/29/2021 Goal Status: Active Interventions: Assess patient/caregiver ability to obtain necessary supplies Assess patient/caregiver ability to perform ulcer/skin care regimen upon admission and as needed Assess ulceration(s) every visit Provide education on ulcer and skin care Treatment Activities: Topical wound management initiated : 02/22/2021 Notes: 03/25/21: All wounds not yet at 30%, culture positive. Using gent cream. Electronic Signature(s) Signed: 04/02/2021 4:27:12 PM By: Levan Hurst RN, BSN Entered By: Levan Hurst on 04/02/2021 11:14:54 -------------------------------------------------------------------------------- Pain Assessment Details Patient Name: Date of Service: [name], [name] 04/02/2021 10:00 A M Medical Record Number: [id number] Patient Account Number: [id number] Date of Birth/Sex: Treating RN: 04-04-1966 (55 y.o. Candace Robinson Primary Care Kaydence Baba: [name] Other Clinician: Referring Haidyn Chadderdon: Treating Wah Sabic/Extender: Debbra Riding in Treatment:  5 Active Problems Location of Pain Severity and Description of Pain Patient Has Paino Yes Site Locations Pain Location: Pain in Ulcers With Dressing Change: Yes Rate the pain. Current Pain Level: 3 Character of Pain Describe the Pain: Throbbing Pain Management and Medication Current Pain Management: Medication: Yes Cold Application: No Rest: No Massage: No Activity: No T.E.N.S.: No Heat Application: No Leg drop or elevation: No Is the Current Pain Management Adequate: Adequate How does your wound impact your activities of daily livingo Sleep: No Bathing: No Appetite: No Relationship With Others: No Bladder Continence: No Emotions: No Bowel Continence: No Work: No Toileting: No Drive: No Dressing: No Hobbies: No Engineer, maintenance) Signed: 04/02/2021 4:27:12 PM By: Levan Hurst RN, BSN Entered By: Levan Hurst on 04/02/2021 10:28:26 -------------------------------------------------------------------------------- Patient/Caregiver Education Details Patient Name: Date of Service: BRO A DNA Rhunette Croft, [name] 9/30/2022andnbsp10:00 A M Medical Record Number: [id number] Patient Account Number: [id number] Date of Birth/Gender: Treating RN: Oct 09, 1965 (55 y.o. Candace Robinson Primary Care Physician: [name] Other Clinician: Referring Physician: Treating Physician/Extender: Debbra Riding in Treatment: 5 Education Assessment Education Provided To: Patient Education Topics Provided Wound/Skin Impairment: Methods: Explain/Verbal Responses: State content correctly Electronic Signature(s) Signed: 04/02/2021 4:27:12 PM By: Levan Hurst RN, BSN Entered By: Levan Hurst on 04/02/2021 11:15:24 -------------------------------------------------------------------------------- Wound Assessment Details Patient Name: Date of Service: [name], [name] 04/02/2021 10:00 A M Medical Record Number: [id number] Patient Account  Number: [id number] Date of Birth/Sex: Treating RN: 12/26/1965 (55 y.o. Candace Robinson Primary Care Laurabelle Gorczyca: [name] Other Clinician: Referring Bana Borgmeyer: Treating Rogen Porte/Extender: Debbra Riding in Treatment: 5 Wound Status Wound Number: 1 Primary Diabetic Wound/Ulcer of the Lower Extremity Etiology: Wound Location: Right, Medial Ankle Wound Open Wounding Event: Gradually Appeared Status: Date Acquired: 04/03/2020 Comorbid Anemia, Arrhythmia, Congestive Heart Failure, Hypertension, Weeks Of Treatment: 5 History: Type II Diabetes, Lupus Erythematosus  Clustered Wound: No Photos Wound Measurements Length: (cm) 5.3 Width: (cm) 4.4 Depth: (cm) 0.3 Area: (cm) 18.315 Volume: (cm) 5.495 % Reduction in Area: -11% % Reduction in Volume: -66.6% Epithelialization: None Tunneling: No Undermining: No Wound Description Classification: Grade 2 Wound Margin: Distinct, outline attached Exudate Amount: Medium Exudate Type: Serosanguineous Exudate Color: red, brown Wound Bed Granulation Amount: Small (1-33%) Granulation Quality: Red Necrotic Amount: Large (67-100%) Necrotic Quality: Adherent Slough Foul Odor After Cleansing: No Slough/Fibrino Yes Exposed Structure Fascia Exposed: No Fat Layer (Subcutaneous Tissue) Exposed: Yes Tendon Exposed: No Muscle Exposed: No Joint Exposed: No Bone Exposed: No Treatment Notes Wound #1 (Ankle) Wound Laterality: Right, Medial Cleanser Soap and Water Discharge Instruction: May shower and wash wound with dial antibacterial soap and water prior to dressing change. Wound Cleanser Discharge Instruction: Cleanse the wound with wound cleanser prior to applying a clean dressing using gauze sponges, not tissue or cotton balls. Peri-Wound Care Topical Primary Dressing Santyl Ointment Discharge Instruction: Apply nickel thick amount to wound bed as instructed Secondary Dressing Woven Gauze Sponge,  Non-Sterile 4x4 in Discharge Instruction: Apply over primary dressing as directed. ABD Pad, 8x10 Discharge Instruction: Apply over primary dressing as directed. Secured With Elastic Bandage 4 inch (ACE bandage) Discharge Instruction: Secure with ACE bandage as directed. Kerlix Roll Sterile, 4.5x3.1 (in/yd) Discharge Instruction: Secure with Kerlix as directed. Transpore Surgical Tape, 2x10 (in/yd) Discharge Instruction: Secure dressing with tape as directed. Compression Wrap Compression Stockings Add-Ons Electronic Signature(s) Signed: 04/02/2021 4:27:12 PM By: Levan Hurst RN, BSN Entered By: Levan Hurst on 04/02/2021 10:45:55 -------------------------------------------------------------------------------- Wound Assessment Details Patient Name: Date of Service: [name], [name] 04/02/2021 10:00 A M Medical Record Number: [id number] Patient Account Number: [id number] Date of Birth/Sex: Treating RN: 1966/03/06 (55 y.o. Candace Robinson Primary Care Jakub Debold: [name] Other Clinician: Referring Luevenia Mcavoy: Treating Lothar Prehn/Extender: Debbra Riding in Treatment: 5 Wound Status Wound Number: 2 Primary Diabetic Wound/Ulcer of the Lower Extremity Etiology: Wound Location: Right, Dorsal Foot Wound Open Wounding Event: Gradually Appeared Status: Date Acquired: 04/03/2020 Comorbid Anemia, Arrhythmia, Congestive Heart Failure, Hypertension, Weeks Of Treatment: 5 Weeks Of Treatment: 5 History: Type II Diabetes, Lupus Erythematosus Clustered Wound: No Photos Wound Measurements Length: (cm) 6 Width: (cm) 5 Depth: (cm) 0.1 Area: (cm) 23.562 Volume: (cm) 2.356 % Reduction in Area: -100% % Reduction in Volume: -100% Epithelialization: None Tunneling: No Undermining: No Wound Description Classification: Grade 2 Wound Margin: Distinct, outline attached Exudate Amount: Medium Exudate Type: Serosanguineous Exudate Color: red,  brown Foul Odor After Cleansing: No Slough/Fibrino Yes Wound Bed Granulation Amount: Medium (34-66%) Exposed Structure Granulation Quality: Red Fascia Exposed: No Necrotic Amount: Medium (34-66%) Fat Layer (Subcutaneous Tissue) Exposed: Yes Necrotic Quality: Adherent Slough Tendon Exposed: No Muscle Exposed: No Joint Exposed: No Bone Exposed: No Treatment Notes Wound #2 (Foot) Wound Laterality: Dorsal, Right Cleanser Soap and Water Discharge Instruction: May shower and wash wound with dial antibacterial soap and water prior to dressing change. Wound Cleanser Discharge Instruction: Cleanse the wound with wound cleanser prior to applying a clean dressing using gauze sponges, not tissue or cotton balls. Peri-Wound Care Topical Primary Dressing Santyl Ointment Discharge Instruction: Apply nickel thick amount to wound bed as instructed Secondary Dressing Woven Gauze Sponge, Non-Sterile 4x4 in Discharge Instruction: Apply over primary dressing as directed. ABD Pad, 8x10 Discharge Instruction: Apply over primary dressing as directed. Secured With Elastic Bandage 4 inch (ACE bandage) Discharge Instruction: Secure with ACE bandage as directed. Kerlix Roll Sterile, 4.5x3.1 (  in/yd) Discharge Instruction: Secure with Kerlix as directed. Transpore Surgical Tape, 2x10 (in/yd) Discharge Instruction: Secure dressing with tape as directed. Compression Wrap Compression Stockings Add-Ons Electronic Signature(s) Signed: 04/02/2021 4:27:12 PM By: Levan Hurst RN, BSN Entered By: Levan Hurst on 04/02/2021 10:46:52 -------------------------------------------------------------------------------- Wound Assessment Details Patient Name: Date of Service: [name], [name] 04/02/2021 10:00 A M Medical Record Number: [id number] Patient Account Number: [id number] Date of Birth/Sex: Treating RN: 1965-12-13 (55 y.o. Candace Robinson Primary Care Ansleigh Safer: [name] Other  Clinician: Referring Enes Rokosz: Treating Mistee Soliman/Extender: Debbra Riding in Treatment: 5 Wound Status Wound Number: 3 Primary Diabetic Wound/Ulcer of the Lower Extremity Etiology: Wound Location: Left, Medial Ankle Wound Open Wounding Event: Gradually Appeared Status: Date Acquired: 02/22/2021 Comorbid Anemia, Arrhythmia, Congestive Heart Failure, Hypertension, Weeks Of Treatment: 5 History: Type II Diabetes, Lupus Erythematosus Clustered Wound: No Photos Wound Measurements Length: (cm) 1 Width: (cm) 2.5 Depth: (cm) 0.1 Area: (cm) 1.963 Volume: (cm) 0.196 % Reduction in Area: 0% % Reduction in Volume: 50.1% Epithelialization: Medium (34-66%) Tunneling: No Undermining: No Wound Description Classification: Grade 2 Wound Margin: Distinct, outline attached Exudate Amount: Medium Exudate Type: Serosanguineous Exudate Color: red, brown Foul Odor After Cleansing: No Slough/Fibrino Yes Wound Bed Granulation Amount: Medium (34-66%) Exposed Structure Granulation Quality: Red Fascia Exposed: No Necrotic Amount: Medium (34-66%) Fat Layer (Subcutaneous Tissue) Exposed: Yes Necrotic Quality: Adherent Slough Tendon Exposed: No Muscle Exposed: No Joint Exposed: No Bone Exposed: No Treatment Notes Wound #3 (Ankle) Wound Laterality: Left, Medial Cleanser Soap and Water Discharge Instruction: May shower and wash wound with dial antibacterial soap and water prior to dressing change. Wound Cleanser Discharge Instruction: Cleanse the wound with wound cleanser prior to applying a clean dressing using gauze sponges, not tissue or cotton balls. Peri-Wound Care Topical Primary Dressing Santyl Ointment Discharge Instruction: Apply nickel thick amount to wound bed as instructed Secondary Dressing Woven Gauze Sponge, Non-Sterile 4x4 in Discharge Instruction: Apply over primary dressing as directed. ABD Pad, 8x10 Discharge Instruction: Apply over primary  dressing as directed. Secured With Elastic Bandage 4 inch (ACE bandage) Discharge Instruction: Secure with ACE bandage as directed. Kerlix Roll Sterile, 4.5x3.1 (in/yd) Discharge Instruction: Secure with Kerlix as directed. Transpore Surgical Tape, 2x10 (in/yd) Discharge Instruction: Secure dressing with tape as directed. Compression Wrap Compression Stockings Add-Ons Electronic Signature(s) Signed: 04/02/2021 4:27:12 PM By: Levan Hurst RN, BSN Entered By: Levan Hurst on 04/02/2021 10:48:16 -------------------------------------------------------------------------------- Wound Assessment Details Patient Name: Date of Service: [name], [name] 04/02/2021 10:00 A M Medical Record Number: [id number] Patient Account Number: [id number] Date of Birth/Sex: Treating RN: 04-May-1966 (55 y.o. Candace Robinson Primary Care Daziah Hesler: [name] Other Clinician: Referring Tiney Zipper: Treating Eldwin Volkov/Extender: Debbra Riding in Treatment: 5 Wound Status Wound Number: 4 Primary Diabetic Wound/Ulcer of the Lower Extremity Etiology: Wound Location: Left, Lateral Ankle Wound Open Wounding Event: Gradually Appeared Status: Date Acquired: 04/03/2020 Comorbid Anemia, Arrhythmia, Congestive Heart Failure, Hypertension, Weeks Of Treatment: 5 History: Type II Diabetes, Lupus Erythematosus Clustered Wound: No Photos Wound Measurements Length: (cm) 7.2 Width: (cm) 5.7 Depth: (cm) 0.1 Area: (cm) 32.233 Volume: (cm) 3.223 % Reduction in Area: 14.5% % Reduction in Volume: 57.3% Epithelialization: Small (1-33%) Tunneling: No Undermining: No Wound Description Classification: Grade 2 Wound Margin: Distinct, outline attached Exudate Amount: Medium Exudate Type: Serosanguineous Exudate Color: red, brown Foul Odor After Cleansing: No Slough/Fibrino Yes Wound Bed Granulation Amount: Medium (34-66%) Exposed Structure Granulation Quality: Red Fascia  Exposed: No Necrotic Amount: Medium (34-66%) Fat Layer (Subcutaneous Tissue) Exposed: Yes Necrotic Quality: Adherent Slough Tendon Exposed: No Muscle Exposed: No Joint Exposed: No Bone Exposed: No Treatment Notes Wound #4 (Ankle) Wound Laterality: Left, Lateral Cleanser Soap and Water Discharge Instruction: May shower and wash wound with dial antibacterial soap and water prior to dressing change. Wound Cleanser Discharge Instruction: Cleanse the wound with wound cleanser prior to applying a clean dressing using gauze sponges, not tissue or cotton balls. Peri-Wound Care Topical Primary Dressing Santyl Ointment Discharge Instruction: Apply nickel thick amount to wound bed as instructed Secondary Dressing Woven Gauze Sponge, Non-Sterile 4x4 in Discharge Instruction: Apply over primary dressing as directed. ABD Pad, 8x10 Discharge Instruction: Apply over primary dressing as directed. Secured With Elastic Bandage 4 inch (ACE bandage) Discharge Instruction: Secure with ACE bandage as directed. Kerlix Roll Sterile, 4.5x3.1 (in/yd) Discharge Instruction: Secure with Kerlix as directed. Transpore Surgical Tape, 2x10 (in/yd) Discharge Instruction: Secure dressing with tape as directed. Compression Wrap Compression Stockings Add-Ons Electronic Signature(s) Signed: 04/02/2021 4:27:12 PM By: Levan Hurst RN, BSN Entered By: Levan Hurst on 04/02/2021 10:50:05 -------------------------------------------------------------------------------- Wound Assessment Details Patient Name: Date of Service: [name], [name] 04/02/2021 10:00 A M Medical Record Number: [id number] Patient Account Number: [id number] Date of Birth/Sex: Treating RN: 22-Apr-1966 (55 y.o. Candace Robinson Primary Care Lemoyne Scarpati: [name] Other Clinician: Referring Lyn Deemer: Treating Daphene Chisholm/Extender: Debbra Riding in Treatment: 5 Wound Status Wound Number: 5 Primary  Diabetic Wound/Ulcer of the Lower Extremity Etiology: Wound Location: Left, Dorsal Foot Wound Open Wounding Event: Gradually Appeared Status: Date Acquired: 04/03/2020 Comorbid Anemia, Arrhythmia, Congestive Heart Failure, Hypertension, Weeks Of Treatment: 5 History: Type II Diabetes, Lupus Erythematosus Clustered Wound: No Photos Wound Measurements Length: (cm) 9 Width: (cm) 9.2 Depth: (cm) 0.1 Area: (cm) 65.031 Volume: (cm) 6.503 % Reduction in Area: -8.2% % Reduction in Volume: -8.2% Epithelialization: None Tunneling: No Undermining: No Wound Description Classification: Grade 2 Wound Margin: Distinct, outline attached Exudate Amount: Medium Exudate Type: Serosanguineous Exudate Color: red, brown Foul Odor After Cleansing: No Slough/Fibrino Yes Wound Bed Granulation Amount: Small (1-33%) Exposed Structure Granulation Quality: Red Fascia Exposed: No Necrotic Amount: Large (67-100%) Fat Layer (Subcutaneous Tissue) Exposed: Yes Necrotic Quality: Adherent Slough Tendon Exposed: No Muscle Exposed: No Joint Exposed: No Bone Exposed: No Treatment Notes Wound #5 (Foot) Wound Laterality: Dorsal, Left Cleanser Soap and Water Discharge Instruction: May shower and wash wound with dial antibacterial soap and water prior to dressing change. Wound Cleanser Discharge Instruction: Cleanse the wound with wound cleanser prior to applying a clean dressing using gauze sponges, not tissue or cotton balls. Peri-Wound Care Topical Primary Dressing Santyl Ointment Discharge Instruction: Apply nickel thick amount to wound bed as instructed Secondary Dressing Woven Gauze Sponge, Non-Sterile 4x4 in Discharge Instruction: Apply over primary dressing as directed. ABD Pad, 8x10 Discharge Instruction: Apply over primary dressing as directed. Secured With Elastic Bandage 4 inch (ACE bandage) Discharge Instruction: Secure with ACE bandage as directed. Kerlix Roll Sterile, 4.5x3.1  (in/yd) Discharge Instruction: Secure with Kerlix as directed. Transpore Surgical Tape, 2x10 (in/yd) Discharge Instruction: Secure dressing with tape as directed. Compression Wrap Compression Stockings Add-Ons Electronic Signature(s) Signed: 04/02/2021 4:27:12 PM By: Levan Hurst RN, BSN Entered By: Levan Hurst on 04/02/2021 10:51:05 -------------------------------------------------------------------------------- Wound Assessment Details Patient Name: Date of Service: [name], [name] 04/02/2021 10:00 A M Medical Record Number: [id number] Patient Account Number: [id number] Date of Birth/Sex: Treating RN: 03/30/1966 (54  y.o. Candace Robinson Primary Care Britani Beattie: [name] Other Clinician: Referring Traveon Louro: Treating Ramyah Pankowski/Extender: Debbra Riding in Treatment: 5 Wound Status Wound Number: 6 Primary Diabetic Wound/Ulcer of the Lower Extremity Etiology: Wound Location: Left, Distal, Medial Foot Wound Open Wounding Event: Gradually Appeared Status: Date Acquired: 04/03/2020 Comorbid Anemia, Arrhythmia, Congestive Heart Failure, Hypertension, Weeks Of Treatment: 5 History: Type II Diabetes, Lupus Erythematosus Clustered Wound: No Photos Wound Measurements Length: (cm) 0.7 Width: (cm) 0.5 Depth: (cm) 0.1 Area: (cm) 0.275 Volume: (cm) 0.027 % Reduction in Area: 12.4% % Reduction in Volume: 12.9% Epithelialization: Medium (34-66%) Tunneling: No Undermining: No Wound Description Classification: Grade 2 Wound Margin: Distinct, outline attached Exudate Amount: Medium Exudate Type: Serosanguineous Exudate Color: red, brown Foul Odor After Cleansing: No Slough/Fibrino No Wound Bed Granulation Amount: Medium (34-66%) Exposed Structure Granulation Quality: Red, Pink Fascia Exposed: No Necrotic Amount: Medium (34-66%) Fat Layer (Subcutaneous Tissue) Exposed: Yes Necrotic Quality: Adherent Slough Tendon Exposed:  No Muscle Exposed: No Joint Exposed: No Bone Exposed: No Treatment Notes Wound #6 (Foot) Wound Laterality: Left, Medial, Distal Cleanser Soap and Water Discharge Instruction: May shower and wash wound with dial antibacterial soap and water prior to dressing change. Wound Cleanser Discharge Instruction: Cleanse the wound with wound cleanser prior to applying a clean dressing using gauze sponges, not tissue or cotton balls. Peri-Wound Care Topical Primary Dressing Santyl Ointment Discharge Instruction: Apply nickel thick amount to wound bed as instructed Secondary Dressing Woven Gauze Sponge, Non-Sterile 4x4 in Discharge Instruction: Apply over primary dressing as directed. ABD Pad, 8x10 Discharge Instruction: Apply over primary dressing as directed. Secured With Elastic Bandage 4 inch (ACE bandage) Discharge Instruction: Secure with ACE bandage as directed. Kerlix Roll Sterile, 4.5x3.1 (in/yd) Discharge Instruction: Secure with Kerlix as directed. Transpore Surgical Tape, 2x10 (in/yd) Discharge Instruction: Secure dressing with tape as directed. Compression Wrap Compression Stockings Add-Ons Electronic Signature(s) Signed: 04/02/2021 4:27:12 PM By: Levan Hurst RN, BSN Entered By: Levan Hurst on 04/02/2021 10:49:14 -------------------------------------------------------------------------------- Vitals Details Patient Name: Date of Service: [name], [name] 04/02/2021 10:00 A M Medical Record Number: [id number] Patient Account Number: [id number] Date of Birth/Sex: Treating RN: 07/01/1966 (55 y.o. Candace Robinson Primary Care Carlyn Mullenbach: [name] Other Clinician: Referring Delshawn Stech: Treating Kendyll Huettner/Extender: Debbra Riding in Treatment: 5 Vital Signs Time Taken: 10:26 Temperature (F): 98.5 Height (in): 63 Pulse (bpm): 58 Weight (lbs): 157 Respiratory Rate (breaths/min): 16 Body Mass Index (BMI): 27.8 Blood Pressure  (mmHg): 126/77 Reference Range: 80 - 120 mg / dl Electronic Signature(s) Signed: 04/02/2021 4:27:12 PM By: Levan Hurst RN, BSN Entered By: Levan Hurst on 04/02/2021 10:28:10

## 2021-04-02 NOTE — Progress Notes (Signed)
Candace Robinson, Candace Robinson ([id number]) Visit Report for 04/02/2021 Debridement Details Patient Name: Date of Service: [name], [name] 04/02/2021 10:00 A M Medical Record Number: [id number] Patient Account Number: [id number] Date of Birth/Sex: Treating RN: [birth year] (55 y.o. [name] Primary Care Provider: [name] Other Clinician: Referring Provider: Treating Provider/Extender: Debbra Riding in Treatment: 5 Debridement Performed for Assessment: Wound #1 Right,Medial Ankle Performed By: Clinician Levan Hurst, RN Debridement Type: Chemical/Enzymatic/Mechanical Agent Used: Santyl Severity of Tissue Pre Debridement: Fat layer exposed Level of Consciousness (Pre-procedure): Awake and Alert Pre-procedure Verification/Time Out Yes - 11:45 Taken: Start Time: 11:45 Bleeding: None Procedural Pain: 0 Post Procedural Pain: 0 Response to Treatment: Procedure was tolerated well Level of Consciousness (Post- Awake and Alert procedure): Post Debridement Measurements of Total Wound Length: (cm) 5.3 Width: (cm) 4.4 Depth: (cm) 0.3 Volume: (cm) 5.495 Character of Wound/Ulcer Post Debridement: Requires Further Debridement Severity of Tissue Post Debridement: Fat layer exposed Post Procedure Diagnosis Same as Pre-procedure Electronic Signature(s) Signed: 04/02/2021 3:46:44 PM By: Linton Ham MD Signed: 04/02/2021 4:27:12 PM By: Levan Hurst RN, BSN Entered By: Levan Hurst on 04/02/2021 11:49:36 -------------------------------------------------------------------------------- Debridement Details Patient Name: Date of Service: [name], [name] 04/02/2021 10:00 A M Medical Record Number: [id number] Patient Account Number: [id number] Date of Birth/Sex: Treating RN: 1965-11-16 (55 y.o. [name] Primary Care Provider: [name] Other Clinician: Referring Provider: Treating Provider/Extender: Debbra Riding in Treatment: 5 Debridement Performed for Assessment: Wound #2 Right,Dorsal Foot Performed By: Clinician Levan Hurst, RN Debridement Type: Chemical/Enzymatic/Mechanical Agent Used: Santyl Severity of Tissue Pre Debridement: Fat layer exposed Level of Consciousness (Pre-procedure): Awake and Alert Pre-procedure Verification/Time Out Yes - 11:45 Taken: Start Time: 11:45 Bleeding: None Procedural Pain: 0 Post Procedural Pain: 0 Response to Treatment: Procedure was tolerated well Level of Consciousness (Post- Awake and Alert procedure): Post Debridement Measurements of Total Wound Length: (cm) 6 Width: (cm) 5 Depth: (cm) 0.1 Volume: (cm) 2.356 Character of Wound/Ulcer Post Debridement: Requires Further Debridement Severity of Tissue Post Debridement: Fat layer exposed Post Procedure Diagnosis Same as Pre-procedure Electronic Signature(s) Signed: 04/02/2021 3:46:44 PM By: Linton Ham MD Signed: 04/02/2021 4:27:12 PM By: Levan Hurst RN, BSN Entered By: Levan Hurst on 04/02/2021 11:50:13 -------------------------------------------------------------------------------- Debridement Details Patient Name: Date of Service: [name], [name] 04/02/2021 10:00 A M Medical Record Number: [id number] Patient Account Number: [id number] Date of Birth/Sex: Treating RN: 19-May-1966 (55 y.o. [name] Primary Care Provider: [name] Other Clinician: Referring Provider: Treating Provider/Extender: Debbra Riding in Treatment: 5 Debridement Performed for Assessment: Wound #3 Left,Medial Ankle Performed By: Clinician Levan Hurst, RN Debridement Type: Chemical/Enzymatic/Mechanical Agent Used: Santyl Severity of Tissue Pre Debridement: Fat layer exposed Level of Consciousness (Pre-procedure): Awake and Alert Pre-procedure Verification/Time Out Yes - 11:45 Taken: Start Time: 11:45 Bleeding: None Procedural Pain: 0 Post  Procedural Pain: 0 Response to Treatment: Procedure was tolerated well Level of Consciousness (Post- Awake and Alert procedure): Post Debridement Measurements of Total Wound Length: (cm) 1 Width: (cm) 2.5 Depth: (cm) 0.1 Volume: (cm) 0.196 Character of Wound/Ulcer Post Debridement: Requires Further Debridement Severity of Tissue Post Debridement: Fat layer exposed Post Procedure Diagnosis Same as Pre-procedure Electronic Signature(s) Signed: 04/02/2021 3:46:44 PM By: Linton Ham MD Signed: 04/02/2021 4:27:12 PM By: Levan Hurst RN, BSN Entered By: Levan Hurst on 04/02/2021 11:51:11 -------------------------------------------------------------------------------- Debridement Details Patient Name: Date of Service: [name],  [name] 04/02/2021 10:00 A M Medical Record Number: [id number] Patient Account Number: [id number] Date of Birth/Sex: Treating RN: 06-14-1966 (55 y.o. [name] Primary Care Provider: [name] Other Clinician: Referring Provider: Treating Provider/Extender: Debbra Riding in Treatment: 5 Debridement Performed for Assessment: Wound #4 Left,Lateral Ankle Performed By: Clinician Levan Hurst, RN Debridement Type: Chemical/Enzymatic/Mechanical Agent Used: Santyl Severity of Tissue Pre Debridement: Fat layer exposed Level of Consciousness (Pre-procedure): Awake and Alert Pre-procedure Verification/Time Out Yes - 11:45 Taken: Start Time: 11:45 Bleeding: None Procedural Pain: 0 Post Procedural Pain: 0 Response to Treatment: Procedure was tolerated well Level of Consciousness (Post- Awake and Alert procedure): Post Debridement Measurements of Total Wound Length: (cm) 7.2 Width: (cm) 5.7 Depth: (cm) 0.1 Volume: (cm) 3.223 Character of Wound/Ulcer Post Debridement: Requires Further Debridement Severity of Tissue Post Debridement: Fat layer exposed Post Procedure Diagnosis Same as  Pre-procedure Electronic Signature(s) Signed: 04/02/2021 3:46:44 PM By: Linton Ham MD Signed: 04/02/2021 4:27:12 PM By: Levan Hurst RN, BSN Entered By: Levan Hurst on 04/02/2021 11:51:32 -------------------------------------------------------------------------------- Debridement Details Patient Name: Date of Service: [name], [name] 04/02/2021 10:00 A M Medical Record Number: [id number] Patient Account Number: [id number] Date of Birth/Sex: Treating RN: 1966/06/02 (55 y.o. [name] Primary Care Provider: [name] Other Clinician: Referring Provider: Treating Provider/Extender: Debbra Riding in Treatment: 5 Debridement Performed for Assessment: Wound #5 Left,Dorsal Foot Performed By: Clinician Levan Hurst, RN Debridement Type: Chemical/Enzymatic/Mechanical Agent Used: Santyl Severity of Tissue Pre Debridement: Fat layer exposed Level of Consciousness (Pre-procedure): Awake and Alert Pre-procedure Verification/Time Out Yes - 11:45 Taken: Start Time: 11:45 Bleeding: None Procedural Pain: 0 Post Procedural Pain: 0 Response to Treatment: Procedure was tolerated well Level of Consciousness (Post- Level of Consciousness (Post- Awake and Alert procedure): Post Debridement Measurements of Total Wound Length: (cm) 9 Width: (cm) 9.2 Depth: (cm) 0.1 Volume: (cm) 6.503 Character of Wound/Ulcer Post Debridement: Requires Further Debridement Severity of Tissue Post Debridement: Fat layer exposed Post Procedure Diagnosis Same as Pre-procedure Electronic Signature(s) Signed: 04/02/2021 3:46:44 PM By: Linton Ham MD Signed: 04/02/2021 4:27:12 PM By: Levan Hurst RN, BSN Entered By: Levan Hurst on 04/02/2021 11:52:49 -------------------------------------------------------------------------------- Debridement Details Patient Name: Date of Service: [name], [name] 04/02/2021 10:00 A M Medical Record Number:  [id number] Patient Account Number: [id number] Date of Birth/Sex: Treating RN: January 16, 1966 (55 y.o. [name] Primary Care Provider: [name] Other Clinician: Referring Provider: Treating Provider/Extender: Debbra Riding in Treatment: 5 Debridement Performed for Assessment: Wound #6 Left,Distal,Medial Foot Performed By: Clinician Levan Hurst, RN Debridement Type: Chemical/Enzymatic/Mechanical Agent Used: Santyl Severity of Tissue Pre Debridement: Fat layer exposed Level of Consciousness (Pre-procedure): Awake and Alert Pre-procedure Verification/Time Out Yes - 11:45 Taken: Start Time: 11:45 Bleeding: None Procedural Pain: 0 Post Procedural Pain: 0 Response to Treatment: Procedure was tolerated well Level of Consciousness (Post- Awake and Alert procedure): Post Debridement Measurements of Total Wound Length: (cm) 0.7 Width: (cm) 0.5 Depth: (cm) 0.1 Volume: (cm) 0.027 Character of Wound/Ulcer Post Debridement: Requires Further Debridement Severity of Tissue Post Debridement: Fat layer exposed Post Procedure Diagnosis Same as Pre-procedure Electronic Signature(s) Signed: 04/02/2021 3:46:44 PM By: Linton Ham MD Signed: 04/02/2021 4:27:12 PM By: Levan Hurst RN, BSN Entered By: Levan Hurst on 04/02/2021 11:53:12 -------------------------------------------------------------------------------- HPI Details Patient Name: Date of Service: [name], [name] 04/02/2021 10:00 A M Medical Record Number: [id number] Patient Account Number:  [id number] Date of Birth/Sex: Treating RN: 1966/04/22 (55 y.o. [name] Primary Care Provider: [name] Other Clinician: Referring Provider: Treating Provider/Extender: Debbra Riding in Treatment: 5 History of Present Illness HPI Description: Admission 8/22 Ms. Iran Kievit is a 55 year old female with a past medical history of diet-controlled  type 2 diabetes, hypothyroidism, chronic diastolic heart failure, cutaneous lupus erythematosus and essential hypertension that presents to the clinic for a 1-month history of worsening bilateral feet ulcers. She states that wounds to her feet have been an ongoing issue for several years. She states that with wound care they improve however they tend to wax and wane. 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 She was discharged to a nursing facility and she was provided wound care for her bilateral feet ulcers at that time. She states they used Silvadene and silver alginate. She states that when she was discharged in April from the nursing facility her wounds had improved but were not fully closed. She currently uses Silvadene but has not been using silver alginate for several months due to running out. She reports significant decline in her wound healing over the last 4 months. She reports pain to the wound sites. She currently denies increased warmth erythema or purulent drainage. 8/29; patient presents for 1 week follow-up. She states she had ABIs completed without TBI's. She continues to use silver alginate with dressing changes. She reports continued pain. She denies signs of infection. 9/8; patient presents for 1 week follow-up. She was evaluated by Dr. Donzetta Matters and she reports follow-up with him in 1 month. She has been using Santyl to the wound beds. She reports some improvement in wound healing. She denies signs of infection. 9/15; patient presents for follow-up. She continues to use Santyl and silver alginate to the wound beds. She states the alginate is sticking and hard to remove with dressing changes. She currently denies signs of infection. 9/22; patient presents for 1 week follow-up. She is using Santyl and silver alginate to the wound beds. She ran out of [place] and has been using Silvadene. She states that gentamicin ointment is ready at her pharmacy and will  be picking this up today. She has no issues or complaints today. She denies signs of infection. 9/30; she is using Santyl and silver alginate. She has substantial wounds on her bilateral feet and bilateral ankles that have been there for a year. She is in a lot of pain. She cannot bear to have these wounds touched let alone a mechanical debridement here. Furthermore the etiology of this is really not totally clear. She had reasonably normal ABIs with triphasic waveforms in August [I did not review this however]. She is completing a course of ciprofloxacin for an area on the left lateral ankle. She has had prior ablations and follows with vascular surgery. Finally she apparently has lupus Electronic Signature(s) Signed: 04/02/2021 3:46:44 PM By: Linton Ham MD Entered By: Linton Ham on 04/02/2021 13:02:59 -------------------------------------------------------------------------------- Physical Exam Details Patient Name: Date of Service: [name], [name] 04/02/2021 10:00 A M Medical Record Number: [id number] Patient Account Number: [id number] Date of Birth/Sex: Treating RN: 1965/11/20 (55 y.o. [name] Primary Care Provider: [name] Other Clinician: Referring Provider: Treating Provider/Extender: Debbra Riding in Treatment: 5 Cardiovascular Popliteal pulses are palpable. Pedal pulses are present but certainly not robust. Notes Wound exam; bilateral dorsal feet and ankles Very necrotic wounds with completely nonviable surfaces for the  most part. No overt surrounding soft tissue infections. The left dorsal foot wound extends up into her toes. No doubt she has chronic venous insufficiency however I am not sure I can blame that for all of what I am seeing. Electronic Signature(s) Signed: 04/02/2021 3:46:44 PM By: Linton Ham MD Entered By: Linton Ham on 04/02/2021  13:05:40 -------------------------------------------------------------------------------- Physician Orders Details Patient Name: Date of Service: [name], [name] 04/02/2021 10:00 A M Medical Record Number: [id number] Patient Account Number: [id number] Date of Birth/Sex: Treating RN: 12-17-1965 (55 y.o. 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 - with Dr. Heber [place] - **60 MINUTES** Return A Other: - [facility]=Supplies Bathing/ Shower/ Hygiene May shower and wash wound with soap and water. - when dressings changed Edema Control - Lymphedema / SCD / Other Elevate legs to the level of the heart or above for 30 minutes daily and/or when sitting, a frequency of: - throughout the day Avoid standing for long periods of time. Additional Orders / Instructions Follow Nutritious Diet - 100-120g of protein daily Wound Treatment Wound #1 - Ankle Wound Laterality: Right, Medial Cleanser: Soap and Water 1 x Per Day/30 Days Discharge Instructions: May shower and wash wound with dial antibacterial soap and water prior to dressing change. Cleanser: Wound Cleanser 1 x Per Day/30 Days Discharge Instructions: Cleanse the wound with wound cleanser prior to applying a clean dressing using gauze  sponges, not tissue or cotton balls. Prim Dressing: Santyl Ointment 1 x Per Day/30 Days ary Discharge Instructions: Apply nickel thick amount to wound bed as instructed Secondary Dressing: Woven Gauze Sponge, Non-Sterile 4x4 in 1 x Per Day/30 Days Discharge Instructions: Apply over primary dressing as directed. Secondary Dressing: ABD Pad, 8x10 1 x Per Day/30 Days Discharge Instructions: Apply over primary dressing as directed. Secured With: Elastic Bandage 4 inch (ACE bandage) 1 x Per Day/30 Days Discharge Instructions: Secure with ACE bandage as directed. Secured With: [employer], 4.5x3.1 (in/yd) 1 x Per Day/30 Days Discharge Instructions: Secure with Kerlix as directed. Secured With: Transpore Surgical Tape, 2x10 (in/yd) 1 x Per Day/30 Days Discharge Instructions: Secure dressing with tape as directed. Wound #2 - Foot Wound Laterality: Dorsal, Right Cleanser: Soap and Water 1 x Per Day/30 Days Discharge Instructions: May shower and wash wound with dial antibacterial soap and water prior to dressing change. Cleanser: Wound Cleanser 1 x Per Day/30 Days Discharge Instructions: Cleanse the wound with wound cleanser prior to applying a clean dressing using gauze sponges, not tissue or cotton balls. Prim Dressing: Santyl Ointment 1 x Per Day/30 Days ary Discharge Instructions: Apply nickel thick amount to wound bed as instructed Secondary Dressing: Woven Gauze Sponge, Non-Sterile 4x4 in 1 x Per Day/30 Days Discharge Instructions: Apply over primary dressing as directed. Secondary Dressing: ABD Pad, 8x10 1 x Per Day/30 Days Discharge Instructions: Apply over primary dressing as directed. Secured With: Elastic Bandage 4 inch (ACE bandage) 1 x Per Day/30 Days  Discharge Instructions: Secure with ACE bandage as directed. Secured With: [employer], 4.5x3.1 (in/yd) 1 x Per Day/30 Days Discharge Instructions: Secure with Kerlix as directed. Secured With: Transpore Surgical Tape,  2x10 (in/yd) 1 x Per Day/30 Days Discharge Instructions: Secure dressing with tape as directed. Wound #3 - Ankle Wound Laterality: Left, Medial Cleanser: Soap and Water 1 x Per Day/30 Days Discharge Instructions: May shower and wash wound with dial antibacterial soap and water prior to dressing change. Cleanser: Wound Cleanser 1 x Per Day/30 Days Discharge Instructions: Cleanse the wound with wound cleanser prior to applying a clean dressing using gauze sponges, not tissue or cotton balls. Prim Dressing: Santyl Ointment 1 x Per Day/30 Days ary Discharge Instructions: Apply nickel thick amount to wound bed as instructed Secondary Dressing: Woven Gauze Sponge, Non-Sterile 4x4 in 1 x Per Day/30 Days Discharge Instructions: Apply over primary dressing as directed. Secondary Dressing: ABD Pad, 8x10 1 x Per Day/30 Days Discharge Instructions: Apply over primary dressing as directed. Secured With: Elastic Bandage 4 inch (ACE bandage) 1 x Per Day/30 Days Discharge Instructions: Secure with ACE bandage as directed. Secured With: [employer], 4.5x3.1 (in/yd) 1 x Per Day/30 Days Discharge Instructions: Secure with Kerlix as directed. Secured With: Transpore Surgical Tape, 2x10 (in/yd) 1 x Per Day/30 Days Discharge Instructions: Secure dressing with tape as directed. Wound #4 - Ankle Wound Laterality: Left, Lateral Cleanser: Soap and Water 1 x Per Day/30 Days Discharge Instructions: May shower and wash wound with dial antibacterial soap and water prior to dressing change. Cleanser: Wound Cleanser 1 x Per Day/30 Days Discharge Instructions: Cleanse the wound with wound cleanser prior to applying a clean dressing using gauze sponges, not tissue or cotton balls. Prim Dressing: Santyl Ointment 1 x Per Day/30 Days ary Discharge Instructions: Apply nickel thick amount to wound bed as instructed Secondary Dressing: Woven Gauze Sponge, Non-Sterile 4x4 in 1 x Per Day/30 Days Discharge Instructions:  Apply over primary dressing as directed. Secondary Dressing: ABD Pad, 8x10 1 x Per Day/30 Days Discharge Instructions: Apply over primary dressing as directed. Secured With: Elastic Bandage 4 inch (ACE bandage) 1 x Per Day/30 Days Discharge Instructions: Secure with ACE bandage as directed. Secured With: [employer], 4.5x3.1 (in/yd) 1 x Per Day/30 Days Discharge Instructions: Secure with Kerlix as directed. Secured With: Transpore Surgical Tape, 2x10 (in/yd) 1 x Per Day/30 Days Discharge Instructions: Secure dressing with tape as directed. Wound #5 - Foot Wound Laterality: Dorsal, Left Cleanser: Soap and Water 1 x Per Day/30 Days Discharge Instructions: May shower and wash wound with dial antibacterial soap and water prior to dressing change. Cleanser: Wound Cleanser 1 x Per Day/30 Days Discharge Instructions: Cleanse the wound with wound cleanser prior to applying a clean dressing using gauze sponges, not tissue or cotton balls. Prim Dressing: Santyl Ointment 1 x Per Day/30 Days ary Discharge Instructions: Apply nickel thick amount to wound bed as instructed Secondary Dressing: Woven Gauze Sponge, Non-Sterile 4x4 in 1 x Per Day/30 Days Discharge Instructions: Apply over primary dressing as directed. Secondary Dressing: ABD Pad, 8x10 1 x Per Day/30 Days Discharge Instructions: Apply over primary dressing as directed. Secured With: Elastic Bandage 4 inch (ACE bandage) 1 x Per Day/30 Days Discharge Instructions: Secure with ACE bandage as directed. Secured With: [employer], 4.5x3.1 (in/yd) 1 x Per Day/30 Days Discharge Instructions: Secure with Kerlix as directed. Secured With: Transpore Surgical Tape, 2x10 (in/yd) 1 x Per Day/30 Days Discharge Instructions: Secure dressing with tape as  directed. Wound #6 - Foot Wound Laterality: Left, Medial, Distal Cleanser: Soap and Water 1 x Per Day/30 Days Discharge Instructions: May shower and wash wound with dial antibacterial soap  and water prior to dressing change. Cleanser: Wound Cleanser 1 x Per Day/30 Days Discharge Instructions: Cleanse the wound with wound cleanser prior to applying a clean dressing using gauze sponges, not tissue or cotton balls. Prim Dressing: Santyl Ointment 1 x Per Day/30 Days ary Discharge Instructions: Apply nickel thick amount to wound bed as instructed Secondary Dressing: Woven Gauze Sponge, Non-Sterile 4x4 in 1 x Per Day/30 Days Discharge Instructions: Apply over primary dressing as directed. Secondary Dressing: ABD Pad, 8x10 1 x Per Day/30 Days Discharge Instructions: Apply over primary dressing as directed. Secured With: Elastic Bandage 4 inch (ACE bandage) 1 x Per Day/30 Days Discharge Instructions: Secure with ACE bandage as directed. Secured With: [employer], 4.5x3.1 (in/yd) 1 x Per Day/30 Days Discharge Instructions: Secure with Kerlix as directed. Secured With: Transpore Surgical Tape, 2x10 (in/yd) 1 x Per Day/30 Days Discharge Instructions: Secure dressing with tape as directed. Electronic Signature(s) Signed: 04/02/2021 3:46:44 PM By: Linton Ham MD Signed: 04/02/2021 4:27:12 PM By: Levan Hurst RN, BSN Entered By: Levan Hurst on 04/02/2021 11:46:20 -------------------------------------------------------------------------------- Problem List Details Patient Name: Date of Service: [name], [name] 04/02/2021 10:00 A M Medical Record Number: [id number] Patient Account Number: [id number] Date of Birth/Sex: Treating RN: [birth year] (55 y.o. 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 02/22/2021 No Yes L97.529 Non-pressure chronic ulcer of other part of  left foot with unspecified severity 02/22/2021 No Yes I73.9 Peripheral vascular disease, unspecified 02/22/2021 No Yes E11.9 Type 2 diabetes mellitus without complications 3/66/4403 No Yes L93.2 Other local lupus erythematosus 02/22/2021 No Yes K74.25 Chronic diastolic (congestive) heart failure 02/22/2021 No Yes I10 Essential (primary) hypertension 02/22/2021 No Yes E11.621 Type 2 diabetes mellitus with foot ulcer 02/22/2021 No Yes Inactive Problems Resolved Problems Electronic Signature(s) Signed: 04/02/2021 3:46:44 PM By: Linton Ham MD Entered By: Linton Ham on 04/02/2021 12:59:34 -------------------------------------------------------------------------------- Progress Note Details Patient Name: Date of Service: [name], [name] 04/02/2021 10:00 A M Medical Record Number: [id number] Patient Account Number: [id number] Date of Birth/Sex: Treating RN: April 07, 1966 (55 y.o. [name] Primary Care Provider: [name] Other Clinician: Referring Provider: Treating Provider/Extender: Debbra Riding in Treatment: 5 Subjective History of Present Illness (HPI) Admission 8/22 Ms. Ambrosia Wisnewski is a 55 year old female with a past medical history of diet-controlled type 2 diabetes, hypothyroidism, chronic diastolic heart failure, cutaneous lupus erythematosus and essential hypertension that presents to the clinic for a 76-month history of worsening bilateral feet ulcers. She states that wounds to her feet have been an ongoing issue for several years. She states that with wound care they improve however they tend to wax and wane. 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 She was discharged to a nursing facility and she was provided wound care for her bilateral feet ulcers at that time. She states they used Silvadene and silver alginate. She states that when she was discharged in April from the nursing facility her  wounds had improved but were not fully closed. She currently uses Silvadene but has not been using  silver alginate for several months due to running out. She reports significant decline in her wound healing over the last 4 months. She reports pain to the wound sites. She currently denies increased warmth erythema or purulent drainage. 8/29; patient presents for 1 week follow-up. She states she had ABIs completed without TBI's. She continues to use silver alginate with dressing changes. She reports continued pain. She denies signs of infection. 9/8; patient presents for 1 week follow-up. She was evaluated by Dr. Donzetta Matters and she reports follow-up with him in 1 month. She has been using Santyl to the wound beds. She reports some improvement in wound healing. She denies signs of infection. 9/15; patient presents for follow-up. She continues to use Santyl and silver alginate to the wound beds. She states the alginate is sticking and hard to remove with dressing changes. She currently denies signs of infection. 9/22; patient presents for 1 week follow-up. She is using Santyl and silver alginate to the wound beds. She ran out of [place] and has been using Silvadene. She states that gentamicin ointment is ready at her pharmacy and will be picking this up today. She has no issues or complaints today. She denies signs of infection. 9/30; she is using Santyl and silver alginate. She has substantial wounds on her bilateral feet and bilateral ankles that have been there for a year. She is in a lot of pain. She cannot bear to have these wounds touched let alone a mechanical debridement here. Furthermore the etiology of this is really not totally clear. She had reasonably normal ABIs with triphasic waveforms in August [I did not review this however]. She is completing a course of ciprofloxacin for an area on the left lateral ankle. She has had prior ablations and follows with vascular surgery. Finally she apparently  has lupus Objective Constitutional Vitals Time Taken: 10:26 AM, Height: 63 in, Weight: 157 lbs, BMI: 27.8, Temperature: 98.5 F, Pulse: 58 bpm, Respiratory Rate: 16 breaths/min, Blood Pressure: 126/77 mmHg. Cardiovascular Popliteal pulses are palpable. Pedal pulses are present but certainly not robust. General Notes: Wound exam; bilateral dorsal feet and ankles Very necrotic wounds with completely nonviable surfaces for the most part. No overt surrounding soft tissue infections. The left dorsal foot wound extends up into her toes. No doubt she has chronic venous insufficiency however I am not sure I can blame that for all of what I am seeing. Integumentary (Hair, Skin) Wound #1 status is Open. Original cause of wound was Gradually Appeared. The date acquired was: 04/03/2020. The wound has been in treatment 5 weeks. The wound is located on the Right,Medial Ankle. The wound measures 5.3cm length x 4.4cm width x 0.3cm depth; 18.315cm^2 area and 5.495cm^3 volume. There is Fat Layer (Subcutaneous Tissue) exposed. There is no tunneling or undermining noted. There is a medium amount of serosanguineous drainage noted. The wound margin is distinct with the outline attached to the wound base. There is small (1-33%) red granulation within the wound bed. There is a large (67-100%) amount of necrotic tissue within the wound bed including Adherent Slough. Wound #2 status is Open. Original cause of wound was Gradually Appeared. The date acquired was: 04/03/2020. The wound has been in treatment 5 weeks. The wound is located on the Right,Dorsal Foot. The wound measures 6cm length x 5cm width x 0.1cm depth; 23.562cm^2 area and 2.356cm^3 volume. There is Fat Layer (Subcutaneous Tissue) exposed. There is no tunneling or undermining noted. There is a medium amount of serosanguineous drainage noted. The wound  margin is distinct with the outline attached to the wound base. There is medium (34-66%) red granulation within  the wound bed. There is a medium (34-66%) amount of necrotic tissue within the wound bed including Adherent Slough. Wound #3 status is Open. Original cause of wound was Gradually Appeared. The date acquired was: 02/22/2021. The wound has been in treatment 5 weeks. The wound is located on the Left,Medial Ankle. The wound measures 1cm length x 2.5cm width x 0.1cm depth; 1.963cm^2 area and 0.196cm^3 volume. There is Fat Layer (Subcutaneous Tissue) exposed. There is no tunneling or undermining noted. There is a medium amount of serosanguineous drainage noted. The wound margin is distinct with the outline attached to the wound base. There is medium (34-66%) red granulation within the wound bed. There is a medium (34-66%) amount of necrotic tissue within the wound bed including Adherent Slough. Wound #4 status is Open. Original cause of wound was Gradually Appeared. The date acquired was: 04/03/2020. The wound has been in treatment 5 weeks. The wound is located on the Left,Lateral Ankle. The wound measures 7.2cm length x 5.7cm width x 0.1cm depth; 32.233cm^2 area and 3.223cm^3 volume. There is Fat Layer (Subcutaneous Tissue) exposed. There is no tunneling or undermining noted. There is a medium amount of serosanguineous drainage noted. The wound margin is distinct with the outline attached to the wound base. There is medium (34-66%) red granulation within the wound bed. There is a medium (34- 66%) amount of necrotic tissue within the wound bed including Adherent Slough. Wound #5 status is Open. Original cause of wound was Gradually Appeared. The date acquired was: 04/03/2020. The wound has been in treatment 5 weeks. The wound is located on the Left,Dorsal Foot. The wound measures 9cm length x 9.2cm width x 0.1cm depth; 65.031cm^2 area and 6.503cm^3 volume. There is Fat Layer (Subcutaneous Tissue) exposed. There is no tunneling or undermining noted. There is a medium amount of serosanguineous drainage noted. The  wound margin is distinct with the outline attached to the wound base. There is small (1-33%) red granulation within the wound bed. There is a large (67-100%) amount of necrotic tissue within the wound bed including Adherent Slough. Wound #6 status is Open. Original cause of wound was Gradually Appeared. The date acquired was: 04/03/2020. The wound has been in treatment 5 weeks. The wound is located on the Left,Distal,Medial Foot. The wound measures 0.7cm length x 0.5cm width x 0.1cm depth; 0.275cm^2 area and 0.027cm^3 volume. There is Fat Layer (Subcutaneous Tissue) exposed. There is no tunneling or undermining noted. There is a medium amount of serosanguineous drainage noted. The wound margin is distinct with the outline attached to the wound base. There is medium (34-66%) red, pink granulation within the wound bed. There is a medium (34-66%) amount of necrotic tissue within the wound bed including Adherent Slough. 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 There was a Chemical/Enzymatic/Mechanical debridement performed by Levan Hurst, RN. to remove Viable and Non-Viable tissue/material.. Agent used was Santyl. A time out was conducted at 11:45, prior  to the start of the procedure. There was no bleeding. The procedure was tolerated well with a pain level of 0 throughout and a pain level of 0 following the  procedure. Post Debridement Measurements: 5.3cm length x 4.4cm width x 0.3cm depth; 5.495cm^3 volume. Character of Wound/Ulcer Post Debridement requires further debridement. Severity of Tissue Post Debridement is: Fat layer exposed. Post procedure Diagnosis Wound #1: Same as Pre-Procedure Wound #2 Pre-procedure diagnosis of Wound #2 is a Diabetic Wound/Ulcer of the Lower Extremity located on the Right,Dorsal Foot .Severity of Tissue Pre Debridement is: Fat layer exposed. There was a Selective/Open Wound Non-Viable Tissue Chemical/Enzymatic/Mechanical performed by Levan Hurst, RN. to remove Viable and Non-Viable tissue/material.. Agent used was Santyl. A time out was conducted at 11:45, prior to the start of the procedure. There was no bleeding. The procedure was tolerated well with a pain level of 0 throughout and a pain level of 0 following the procedure. Post Debridement Measurements: 6cm length x 5cm width x 0.1cm depth; 2.356cm^3 volume. Character of Wound/Ulcer Post Debridement requires further debridement. Severity of Tissue Post Debridement is: Fat layer exposed. Post procedure Diagnosis Wound #2: Same as Pre-Procedure Wound #3 Pre-procedure diagnosis of Wound #3 is a Diabetic Wound/Ulcer of the Lower Extremity located on the Left,Medial Ankle .Severity of Tissue Pre Debridement is: Fat layer exposed. There was a Selective/Open Wound Non-Viable Tissue Chemical/Enzymatic/Mechanical performed by Levan Hurst, RN. to remove Viable and Non-Viable tissue/material.. Agent used was Santyl. A time out was conducted at 11:45, prior to the start of the procedure. There was no bleeding. The procedure was tolerated well with a pain level of 0 throughout and a pain level of 0 following the procedure. Post Debridement Measurements: 1cm length x 2.5cm width x 0.1cm depth; 0.196cm^3 volume. Character of Wound/Ulcer Post Debridement requires further debridement. Severity of Tissue Post Debridement is:  Fat layer exposed. Post procedure Diagnosis Wound #3: Same as Pre-Procedure Wound #4 Pre-procedure diagnosis of Wound #4 is a Diabetic Wound/Ulcer of the Lower Extremity located on the Left,Lateral Ankle .Severity of Tissue Pre Debridement is: Fat layer exposed. There was a Selective/Open Wound Non-Viable Tissue Chemical/Enzymatic/Mechanical performed by Levan Hurst, RN. to remove Viable and Non-Viable tissue/material.. Agent used was Santyl. A time out was conducted at 11:45, prior to the start of the procedure. There was no bleeding. The procedure was tolerated well with a pain level of 0 throughout and a pain level of 0 following the procedure. Post Debridement Measurements: 7.2cm length x 5.7cm width x 0.1cm depth; 3.223cm^3 volume. Character of Wound/Ulcer Post Debridement requires further debridement. Severity of Tissue Post Debridement is: Fat layer exposed. Post procedure Diagnosis Wound #4: Same as Pre-Procedure Wound #5 Pre-procedure diagnosis of Wound #5 is a Diabetic Wound/Ulcer of the Lower Extremity located on the Left,Dorsal Foot .Severity of Tissue Pre Debridement is: Fat layer exposed. There was a Selective/Open Wound Non-Viable Tissue Chemical/Enzymatic/Mechanical performed by Levan Hurst, RN. to remove Viable and Non-Viable tissue/material.. Agent used was Santyl. A time out was conducted at 11:45, prior to the start of the procedure. There was no bleeding. The procedure was tolerated well with a pain level of 0 throughout and a pain level of 0 following the procedure. Post Debridement Measurements: 9cm length x 9.2cm width x 0.1cm depth; 6.503cm^3 volume. Character of Wound/Ulcer Post Debridement requires further debridement. Severity of Tissue Post Debridement is: Fat layer exposed. Post procedure Diagnosis Wound #5: Same as Pre-Procedure Wound #6 Pre-procedure diagnosis of Wound #6 is a Diabetic Wound/Ulcer of the  Lower Extremity located on the Left,Distal,Medial Foot  .Severity of Tissue Pre Debridement is: Fat layer exposed. There was a Selective/Open Wound Non-Viable Tissue Chemical/Enzymatic/Mechanical performed by Levan Hurst, RN. to remove Viable and Non-Viable tissue/material.. Agent used was Santyl. A time out was conducted at 11:45, prior to the start of the procedure. There was no bleeding. The procedure was tolerated well with a pain level of 0 throughout and a pain level of 0 following the procedure. Post Debridement Measurements: 0.7cm length x 0.5cm width x 0.1cm depth; 0.027cm^3 volume. Character of Wound/Ulcer Post Debridement requires further debridement. Severity of Tissue Post Debridement is: Fat layer exposed. Post procedure Diagnosis Wound #6: Same as Pre-Procedure Plan Follow-up Appointments: Return Appointment in 1 week. - with Dr. Heber Beaumont - **60 MINUTES** Other: - [facility]=Supplies Bathing/ Shower/ Hygiene: May shower and wash wound with soap and water. - when dressings changed Edema Control - Lymphedema / SCD / Other: Elevate legs to the level of the heart or above for 30 minutes daily and/or when sitting, a frequency of: - throughout the day Avoid standing for long periods of time. Additional Orders / Instructions: Follow Nutritious Diet - 100-120g of protein daily WOUND #1: - Ankle Wound Laterality: Right, Medial Cleanser: Soap and Water 1 x Per Day/30 Days Discharge Instructions: May shower and wash wound with dial antibacterial soap and water prior to dressing change. Cleanser: Wound Cleanser 1 x Per Day/30 Days Discharge Instructions: Cleanse the wound with wound cleanser prior to applying a clean dressing using gauze sponges, not tissue or cotton balls. Prim Dressing: Santyl Ointment 1 x Per Day/30 Days ary Discharge Instructions: Apply nickel thick amount to wound bed as instructed Secondary Dressing: Woven Gauze Sponge, Non-Sterile 4x4 in 1 x Per Day/30 Days Discharge Instructions: Apply over primary dressing as  directed. Secondary Dressing: ABD Pad, 8x10 1 x Per Day/30 Days Discharge Instructions: Apply over primary dressing as directed. Secured With: Elastic Bandage 4 inch (ACE bandage) 1 x Per Day/30 Days Discharge Instructions: Secure with ACE bandage as directed. Secured With: [employer], 4.5x3.1 (in/yd) 1 x Per Day/30 Days Discharge Instructions: Secure with Kerlix as directed. Secured With: [employer] T ape, 2x10 (in/yd) 1 x Per Day/30 Days Discharge Instructions: Secure dressing with tape as directed. WOUND #2: - Foot Wound Laterality: Dorsal, Right Cleanser: Soap and Water 1 x Per Day/30 Days Discharge Instructions: May shower and wash wound with dial antibacterial soap and water prior to dressing change. Cleanser: Wound Cleanser 1 x Per Day/30 Days Discharge Instructions: Cleanse the wound with wound cleanser prior to applying a clean dressing using gauze sponges, not tissue or cotton balls. Prim Dressing: Santyl Ointment 1 x Per Day/30 Days ary Discharge Instructions: Apply nickel thick amount to wound bed as instructed Secondary Dressing: Woven Gauze Sponge, Non-Sterile 4x4 in 1 x Per Day/30 Days Discharge Instructions: Apply over primary dressing as directed. Secondary Dressing: ABD Pad, 8x10 1 x Per Day/30 Days Discharge Instructions: Apply over primary dressing as directed. Secured With: Elastic Bandage 4 inch (ACE bandage) 1 x Per Day/30 Days Discharge Instructions: Secure with ACE bandage as directed. Secured With: [employer], 4.5x3.1 (in/yd) 1 x Per Day/30 Days Discharge Instructions: Secure with Kerlix as directed. Secured With: [employer] T ape, 2x10 (in/yd) 1 x Per Day/30 Days Discharge Instructions: Secure dressing with tape as directed. WOUND #3: - Ankle Wound Laterality: Left, Medial Cleanser: Soap and Water 1 x Per Day/30 Days Discharge Instructions: May shower and wash wound with dial  antibacterial soap and water prior to dressing  change. Cleanser: Wound Cleanser 1 x Per Day/30 Days Discharge Instructions: Cleanse the wound with wound cleanser prior to applying a clean dressing using gauze sponges, not tissue or cotton balls. Prim Dressing: Santyl Ointment 1 x Per Day/30 Days ary Discharge Instructions: Apply nickel thick amount to wound bed as instructed Secondary Dressing: Woven Gauze Sponge, Non-Sterile 4x4 in 1 x Per Day/30 Days Discharge Instructions: Apply over primary dressing as directed. Secondary Dressing: ABD Pad, 8x10 1 x Per Day/30 Days Discharge Instructions: Apply over primary dressing as directed. Secured With: Elastic Bandage 4 inch (ACE bandage) 1 x Per Day/30 Days Discharge Instructions: Secure with ACE bandage as directed. Secured With: [employer], 4.5x3.1 (in/yd) 1 x Per Day/30 Days Discharge Instructions: Secure with Kerlix as directed. Secured With: [employer] T ape, 2x10 (in/yd) 1 x Per Day/30 Days Discharge Instructions: Secure dressing with tape as directed. WOUND #4: - Ankle Wound Laterality: Left, Lateral Cleanser: Soap and Water 1 x Per Day/30 Days Discharge Instructions: May shower and wash wound with dial antibacterial soap and water prior to dressing change. Cleanser: Wound Cleanser 1 x Per Day/30 Days Discharge Instructions: Cleanse the wound with wound cleanser prior to applying a clean dressing using gauze sponges, not tissue or cotton balls. Prim Dressing: Santyl Ointment 1 x Per Day/30 Days ary Discharge Instructions: Apply nickel thick amount to wound bed as instructed Secondary Dressing: Woven Gauze Sponge, Non-Sterile 4x4 in 1 x Per Day/30 Days Discharge Instructions: Apply over primary dressing as directed. Secondary Dressing: ABD Pad, 8x10 1 x Per Day/30 Days Discharge Instructions: Apply over primary dressing as directed. Secured With: Elastic Bandage 4 inch (ACE bandage) 1 x Per Day/30 Days Discharge Instructions: Secure with ACE bandage as  directed. Secured With: [employer], 4.5x3.1 (in/yd) 1 x Per Day/30 Days Discharge Instructions: Secure with Kerlix as directed. Secured With: [employer] T ape, 2x10 (in/yd) 1 x Per Day/30 Days Discharge Instructions: Secure dressing with tape as directed. WOUND #5: - Foot Wound Laterality: Dorsal, Left Cleanser: Soap and Water 1 x Per Day/30 Days Discharge Instructions: May shower and wash wound with dial antibacterial soap and water prior to dressing change. Cleanser: Wound Cleanser 1 x Per Day/30 Days Discharge Instructions: Cleanse the wound with wound cleanser prior to applying a clean dressing using gauze sponges, not tissue or cotton balls. Prim Dressing: Santyl Ointment 1 x Per Day/30 Days ary Discharge Instructions: Apply nickel thick amount to wound bed as instructed Secondary Dressing: Woven Gauze Sponge, Non-Sterile 4x4 in 1 x Per Day/30 Days Discharge Instructions: Apply over primary dressing as directed. Secondary Dressing: ABD Pad, 8x10 1 x Per Day/30 Days Discharge Instructions: Apply over primary dressing as directed. Secured With: Elastic Bandage 4 inch (ACE bandage) 1 x Per Day/30 Days Discharge Instructions: Secure with ACE bandage as directed. Secured With: [employer], 4.5x3.1 (in/yd) 1 x Per Day/30 Days Discharge Instructions: Secure with Kerlix as directed. Secured With: [employer] T ape, 2x10 (in/yd) 1 x Per Day/30 Days Discharge Instructions: Secure dressing with tape as directed. WOUND #6: - Foot Wound Laterality: Left, Medial, Distal Cleanser: Soap and Water 1 x Per Day/30 Days Discharge Instructions: May shower and wash wound with dial antibacterial soap and water prior to dressing change. Cleanser: Wound Cleanser 1 x Per Day/30 Days Discharge Instructions: Cleanse the wound with wound cleanser prior to applying a clean dressing using gauze sponges, not tissue or cotton balls. Prim Dressing: Santyl Ointment  1 x Per Day/30  Days ary Discharge Instructions: Apply nickel thick amount to wound bed as instructed Secondary Dressing: Woven Gauze Sponge, Non-Sterile 4x4 in 1 x Per Day/30 Days Discharge Instructions: Apply over primary dressing as directed. Secondary Dressing: ABD Pad, 8x10 1 x Per Day/30 Days Discharge Instructions: Apply over primary dressing as directed. Secured With: Elastic Bandage 4 inch (ACE bandage) 1 x Per Day/30 Days Discharge Instructions: Secure with ACE bandage as directed. Secured With: [employer], 4.5x3.1 (in/yd) 1 x Per Day/30 Days Discharge Instructions: Secure with Kerlix as directed. Secured With: [employer] T ape, 2x10 (in/yd) 1 x Per Day/30 Days Discharge Instructions: Secure dressing with tape as directed. 1. I continued the Santyl. She could not afford this so we are going to go through the patient assistance program. 2. I am uncertain about the exact etiology of this. This could be a combination of chronic venous insufficiency with perhaps microvascular ischemia. They did not do TBI's on her during her original arterial evaluation. 3. She apparently has lupus although I did not review this in detail. 1 would wonder whether antiphospholipid with venous and arteriolar disease could be contributing to all of this. 4. The patient has a lot of pain unfortunately we cannot really address this Electronic Signature(s) Signed: 04/02/2021 3:46:44 PM By: Linton Ham MD Entered By: Linton Ham on 04/02/2021 13:07:21 -------------------------------------------------------------------------------- SuperBill Details Patient Name: Date of Service: [name], [name] 04/02/2021 Medical Record Number: [id number] Patient Account Number: [id number] Date of Birth/Sex: Treating RN: 18-Aug-1965 (55 y.o. 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 I73.9 Peripheral vascular disease, unspecified E11.9 Type 2 diabetes mellitus without complications X21.1 Other local lupus erythematosus H41.74 Chronic diastolic (congestive) heart failure I10 Essential (primary) hypertension E11.621 Type 2 diabetes mellitus with foot ulcer Facility Procedures CPT4 Code: [id number] Description: 56314 - DEBRIDE W/O ANES NON SELECT Modifier: Quantity: 1 Physician Procedures : CPT4 Code Description Modifier [id number] - WC PHYS LEVEL 3 - EST PT ICD-10 Diagnosis Description L97.519 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

## 2021-04-06 ENCOUNTER — Other Ambulatory Visit: Payer: Self-pay

## 2021-04-06 DIAGNOSIS — S81801A Unspecified open wound, right lower leg, initial encounter: Secondary | ICD-10-CM

## 2021-04-06 NOTE — Progress Notes (Signed)
Error

## 2021-04-08 ENCOUNTER — Encounter (HOSPITAL_BASED_OUTPATIENT_CLINIC_OR_DEPARTMENT_OTHER): Payer: Medicaid Other | Attending: Internal Medicine | Admitting: Internal Medicine

## 2021-04-08 ENCOUNTER — Other Ambulatory Visit: Payer: Self-pay

## 2021-04-08 DIAGNOSIS — I110 Hypertensive heart disease with heart failure: Secondary | ICD-10-CM | POA: Insufficient documentation

## 2021-04-08 DIAGNOSIS — E11621 Type 2 diabetes mellitus with foot ulcer: Secondary | ICD-10-CM | POA: Diagnosis not present

## 2021-04-08 DIAGNOSIS — E1151 Type 2 diabetes mellitus with diabetic peripheral angiopathy without gangrene: Secondary | ICD-10-CM | POA: Insufficient documentation

## 2021-04-08 DIAGNOSIS — I5032 Chronic diastolic (congestive) heart failure: Secondary | ICD-10-CM | POA: Diagnosis not present

## 2021-04-08 DIAGNOSIS — L97512 Non-pressure chronic ulcer of other part of right foot with fat layer exposed: Secondary | ICD-10-CM | POA: Diagnosis not present

## 2021-04-08 DIAGNOSIS — L97522 Non-pressure chronic ulcer of other part of left foot with fat layer exposed: Secondary | ICD-10-CM | POA: Insufficient documentation

## 2021-04-08 DIAGNOSIS — E119 Type 2 diabetes mellitus without complications: Secondary | ICD-10-CM | POA: Diagnosis not present

## 2021-04-13 NOTE — Progress Notes (Signed)
Candace Robinson, Candace Robinson ([id number]) Visit Report for 04/08/2021 Chief Complaint Document Details Patient Name: Date of Service: Candace A DNA X, [name] 04/08/2021 10:45 A M Medical Record Number: [id number] Patient Account Number: [id number] Date of Birth/Sex: Treating RN: Jun 20, 1966 (55 y.o. Candace Robinson, Candace Robinson Primary Care Provider: Leonie Robinson Other Clinician: Referring Provider: Treating Provider/Extender: Candace Robinson in Treatment: 6 Information Obtained from: Patient Chief Complaint Bilateral feet wounds Electronic Signature(s) Signed: 04/08/2021 1:23:11 PM By: Candace Shan DO Entered By: Candace Robinson on 04/08/2021 11:46:59 -------------------------------------------------------------------------------- Debridement Details Patient Name: Date of Service: Candace Robinson, [name] 04/08/2021 10:45 A M Medical Record Number: [id number] Patient Account Number: [id number] Date of Birth/Sex: Treating RN: 15-Nov-1965 (55 y.o. Candace Robinson, Candace Robinson Primary Care Provider: Leonie Robinson Other Clinician: Referring Provider: Treating Provider/Extender: Candace Robinson in Treatment: 6 Debridement Performed for Assessment: Wound #2 Right,Dorsal Foot Performed By: Physician Candace Shan, DO Debridement Type: Debridement Severity of Tissue Pre Debridement: Fat layer exposed Level of Consciousness (Pre-procedure): Awake and Alert Pre-procedure Verification/Time Out Yes - 11:34 Taken: Start Time: 11:34 Pain Control: Lidocaine T Area Debrided (L x W): otal 5 (cm) x 5.5 (cm) = 27.5 (cm) Tissue and other material debrided: Viable, Non-Viable, Slough, Subcutaneous, Skin: Dermis , Skin: Epidermis, Slough Level: Skin/Subcutaneous Tissue Debridement Description: Excisional Instrument: Curette Bleeding: Minimum Hemostasis Achieved: Pressure End Time: 11:34 Procedural Pain: 0 Post Procedural Pain: 0 Response to Treatment:  Procedure was tolerated well Level of Consciousness (Post- Awake and Alert procedure): Post Debridement Measurements of Total Wound Length: (cm) 5 Width: (cm) 5.5 Depth: (cm) 0.2 Volume: (cm) 4.32 Character of Wound/Ulcer Post Debridement: Improved Severity of Tissue Post Debridement: Fat layer exposed Post Procedure Diagnosis Same as Pre-procedure Electronic Signature(s) Signed: 04/08/2021 1:23:11 PM By: Candace Shan DO Signed: 04/13/2021 5:02:53 PM By: Candace Hammock RN Entered By: Candace Robinson on 04/08/2021 11:42:19 -------------------------------------------------------------------------------- Debridement Details Patient Name: Date of Service: Candace Robinson, [name] 04/08/2021 10:45 A M Medical Record Number: [id number] Patient Account Number: [id number] Date of Birth/Sex: Treating RN: 1965/10/01 (55 y.o. Candace Robinson, Candace Robinson Primary Care Provider: Leonie Robinson Other Clinician: Referring Provider: Treating Provider/Extender: Candace Robinson in Treatment: 6 Debridement Performed for Assessment: Wound #1 Right,Medial Ankle Performed By: Physician Candace Shan, DO Debridement Type: Debridement Severity of Tissue Pre Debridement: Fat layer exposed Level of Consciousness (Pre-procedure): Awake and Alert Pre-procedure Verification/Time Out Yes - 11:34 Taken: Start Time: 11:34 Pain Control: Lidocaine T Area Debrided (L x W): otal 5.5 (cm) x 4.9 (cm) = 26.95 (cm) Tissue and other material debrided: Viable, Non-Viable, Slough, Subcutaneous, Skin: Dermis , Skin: Epidermis, Slough Level: Skin/Subcutaneous Tissue Debridement Description: Excisional Instrument: Curette Bleeding: Minimum Hemostasis Achieved: Pressure End Time: 11:34 Procedural Pain: 0 Post Procedural Pain: 0 Response to Treatment: Procedure was tolerated well Level of Consciousness (Post- Awake and Alert procedure): Post Debridement Measurements of Total  Wound Length: (cm) 5.5 Width: (cm) 4.9 Depth: (cm) 0.3 Volume: (cm) 6.35 Character of Wound/Ulcer Post Debridement: Improved Severity of Tissue Post Debridement: Fat layer exposed Post Procedure Diagnosis Same as Pre-procedure Electronic Signature(s) Signed: 04/08/2021 1:23:11 PM By: Candace Shan DO Signed: 04/13/2021 5:02:53 PM By: Candace Hammock RN Entered By: Candace Robinson on 04/08/2021 11:43:00 -------------------------------------------------------------------------------- Debridement Details Patient Name: Date of Service: Candace Robinson, [name] 04/08/2021 10:45 A M Medical Record Number: [id number] Patient Account Number: [id number] Date of Birth/Sex: Treating RN: December 25, 1965 (55 y.o. F)  Candace Robinson Primary Care Provider: Leonie Robinson Other Clinician: Referring Provider: Treating Provider/Extender: Candace Robinson in Treatment: 6 Debridement Performed for Assessment: Wound #5 Left,Dorsal Foot Performed By: Physician Candace Shan, DO Debridement Type: Debridement Severity of Tissue Pre Debridement: Fat layer exposed Level of Consciousness (Pre-procedure): Awake and Alert Pre-procedure Verification/Time Out Yes - 11:34 Taken: Start Time: 11:34 Pain Control: Lidocaine T Area Debrided (L x W): otal 10 (cm) x 9 (cm) = 90 (cm) Tissue and other material debrided: Viable, Non-Viable, Slough, Subcutaneous, Skin: Dermis , Skin: Epidermis, Slough Level: Skin/Subcutaneous Tissue Debridement Description: Excisional Instrument: Curette Bleeding: Minimum Hemostasis Achieved: Pressure End Time: 11:34 Procedural Pain: 0 Post Procedural Pain: 0 Response to Treatment: Procedure was tolerated well Level of Consciousness (Post- Awake and Alert procedure): Post Debridement Measurements of Total Wound Length: (cm) 10 Width: (cm) 9 Depth: (cm) 0.2 Volume: (cm) 14.137 Character of Wound/Ulcer Post Debridement: Improved Severity  of Tissue Post Debridement: Fat layer exposed Post Procedure Diagnosis Same as Pre-procedure Electronic Signature(s) Signed: 04/08/2021 1:23:11 PM By: Candace Shan DO Signed: 04/13/2021 5:02:53 PM By: Candace Hammock RN Entered By: Candace Robinson on 04/08/2021 11:43:29 -------------------------------------------------------------------------------- Debridement Details Patient Name: Date of Service: Candace Robinson, [name] 04/08/2021 10:45 A M Medical Record Number: [id number] Patient Account Number: [id number] Date of Birth/Sex: Treating RN: 11-24-1965 (55 y.o. Candace Robinson, Candace Robinson Primary Care Provider: Leonie Robinson Other Clinician: Referring Provider: Treating Provider/Extender: Candace Robinson in Treatment: 6 Debridement Performed for Assessment: Wound #4 Left,Lateral Ankle Performed By: Physician Candace Shan, DO Debridement Type: Debridement Severity of Tissue Pre Debridement: Fat layer exposed Level of Consciousness (Pre-procedure): Awake and Alert Pre-procedure Verification/Time Out Yes - 11:34 Taken: Start Time: 11:34 Pain Control: Lidocaine T Area Debrided (L x W): otal 7.5 (cm) x 4.5 (cm) = 33.75 (cm) Tissue and other material debrided: Viable, Non-Viable, Slough, Subcutaneous, Skin: Dermis , Skin: Epidermis, Slough Level: Skin/Subcutaneous Tissue Debridement Description: Excisional Instrument: Curette Bleeding: Minimum Hemostasis Achieved: Pressure End Time: 11:34 Procedural Pain: 0 Post Procedural Pain: 0 Response to Treatment: Procedure was tolerated well Level of Consciousness (Post- Awake and Alert procedure): Post Debridement Measurements of Total Wound Length: (cm) 7.5 Width: (cm) 4.5 Depth: (cm) 0.2 Volume: (cm) 5.301 Character of Wound/Ulcer Post Debridement: Improved Severity of Tissue Post Debridement: Fat layer exposed Post Procedure Diagnosis Same as Pre-procedure Electronic Signature(s) Signed:  04/08/2021 1:23:11 PM By: Candace Shan DO Signed: 04/13/2021 5:02:53 PM By: Candace Hammock RN Entered By: Candace Robinson on 04/08/2021 11:44:11 -------------------------------------------------------------------------------- HPI Details Patient Name: Date of Service: Candace Robinson, [name] 04/08/2021 10:45 A M Medical Record Number: [id number] Patient Account Number: [id number] Date of Birth/Sex: Treating RN: [birth year] (55 y.o. Candace Robinson, Candace Robinson Primary Care Provider: Leonie Robinson Other Clinician: Referring Provider: Treating Provider/Extender: [name] Weeks in Treatment: 6 History of Present Illness HPI Description: Admission 8/22 Ms. Marye Eagen is a 55 year old female with a past medical history of diet-controlled type 2 diabetes, hypothyroidism, chronic diastolic heart failure, cutaneous lupus erythematosus and essential hypertension that presents to the clinic for a 29-month history of worsening bilateral feet ulcers. She states that wounds to her feet have been an ongoing issue for several years. She states that with wound care they improve however they tend to wax and wane. 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 She was discharged to a nursing facility and she was provided wound care for her bilateral feet ulcers  at that time. She states they used Silvadene and silver alginate. She states that when she was discharged in April from the nursing facility her wounds had improved but were not fully closed. She currently uses Silvadene but has not been using silver alginate for several months due to running out. She reports significant decline in her wound healing over the last 4 months. She reports pain to the wound sites. She currently denies increased warmth erythema or purulent drainage. 8/29; patient presents for 1 week follow-up. She states she had ABIs completed without TBI's. She continues to use silver  alginate with dressing changes. She reports continued pain. She denies signs of infection. 9/8; patient presents for 1 week follow-up. She was evaluated by Dr. Donzetta Matters and she reports follow-up with him in 1 month. She has been using Santyl to the wound beds. She reports some improvement in wound healing. She denies signs of infection. 9/15; patient presents for follow-up. She continues to use Santyl and silver alginate to the wound beds. She states the alginate is sticking and hard to remove with dressing changes. She currently denies signs of infection. 9/22; patient presents for 1 week follow-up. She is using Santyl and silver alginate to the wound beds. She ran out of [place] and has been using Silvadene. She states that gentamicin ointment is ready at her pharmacy and will be picking this up today. She has no issues or complaints today. She denies signs of infection. 9/30; she is using Santyl and silver alginate. She has substantial wounds on her bilateral feet and bilateral ankles that have been there for a year. She is in a lot of pain. She cannot bear to have these wounds touched let alone a mechanical debridement here. Furthermore the etiology of this is really not totally clear. She had reasonably normal ABIs with triphasic waveforms in August [I did not review this however]. She is completing a course of ciprofloxacin for an area on the left lateral ankle. She has had prior ablations and follows with vascular surgery. Finally she apparently has lupus 10/6; patient has been using Silvadene to the wound beds with dressing changes. She states she is picking up Santyl today. She has no issues or complaints today. She denies signs of infection. Electronic Signature(s) Signed: 04/08/2021 1:23:11 PM By: Candace Shan DO Signed: 04/08/2021 1:23:11 PM By: Candace Shan DO Entered By: Candace Robinson on 04/08/2021  11:47:52 -------------------------------------------------------------------------------- Physical Exam Details Patient Name: Date of Service: Candace A DNA X, [name] 04/08/2021 10:45 A M Medical Record Number: [id number] Patient Account Number: [id number] Date of Birth/Sex: Treating RN: [birth year] (55 y.o. Candace Robinson, Candace Robinson Primary Care Provider: Leonie Robinson Other Clinician: Referring Provider: Treating Provider/Extender: [name] Weeks in Treatment: 6 Constitutional respirations regular, non-labored and within target range for patient.. Cardiovascular 2+ dorsalis pedis/posterior tibialis pulses. Psychiatric pleasant and cooperative. Notes Wounds throughout feet bilaterally with mostly nonviable tissue and scant granulation tissue present. Electronic Signature(s) Signed: 04/08/2021 1:23:11 PM By: Candace Shan DO Entered By: Candace Robinson on 04/08/2021 11:50:00 -------------------------------------------------------------------------------- Physician Orders Details Patient Name: Date of Service: Candace Robinson, [name] 04/08/2021 10:45 A M Medical Record Number: [id number] Patient Account Number: [id number] Date of Birth/Sex: Treating RN: 1966-05-07 (55 y.o. Candace Robinson, Candace Robinson Primary Care Provider: Leonie Robinson Other Clinician: Referring Provider: Treating Provider/Extender: Candace Robinson in Treatment: 6 Verbal / Phone Orders: No Diagnosis Coding ICD-10 Coding Code Description [phone] Non-pressure chronic ulcer of other part of  right foot with fat layer exposed L97.522 Non-pressure chronic ulcer of other part of left foot with fat layer exposed I73.9 Peripheral vascular disease, unspecified E11.9 Type 2 diabetes mellitus without complications N56.2 Other local lupus erythematosus Z30.86 Chronic diastolic (congestive) heart failure I10 Essential (primary) hypertension E11.621 Type 2 diabetes mellitus  with foot ulcer Follow-up Appointments ppointment in 1 week. - any day any doctor ***EXTRA TIME 75 MINUTES*** Return A Other: - [facility]=Supplies Bathing/ Shower/ Hygiene May shower and wash wound with soap and water. - when dressings changed Edema Control - Lymphedema / SCD / Other Elevate legs to the level of the heart or above for 30 minutes daily and/or when sitting, a frequency of: - throughout the day Avoid standing for long periods of time. Additional Orders / Instructions Follow Nutritious Diet - 100-120g of protein daily Wound Treatment Wound #1 - Ankle Wound Laterality: Right, Medial Cleanser: Soap and Water 1 x Per Day/30 Days Discharge Instructions: May shower and wash wound with dial antibacterial soap and water prior to dressing change. Cleanser: Wound Cleanser 1 x Per Day/30 Days Discharge Instructions: Cleanse the wound with wound cleanser prior to applying a clean dressing using gauze sponges, not tissue or cotton balls. Prim Dressing: Santyl Ointment 1 x Per Day/30 Days ary Discharge Instructions: Apply nickel thick amount to wound bed as instructed Secondary Dressing: Woven Gauze Sponge, Non-Sterile 4x4 in 1 x Per Day/30 Days Discharge Instructions: Apply over primary dressing as directed. Secondary Dressing: ABD Pad, 8x10 1 x Per Day/30 Days Discharge Instructions: Apply over primary dressing as directed. Secured With: Elastic Bandage 4 inch (ACE bandage) 1 x Per Day/30 Days Discharge Instructions: Secure with ACE bandage as directed. Secured With: [employer], 4.5x3.1 (in/yd) 1 x Per Day/30 Days Discharge Instructions: Secure with Kerlix as directed. Secured With: Transpore Surgical Tape, 2x10 (in/yd) 1 x Per Day/30 Days Discharge Instructions: Secure dressing with tape as directed. Wound #2 - Foot Wound Laterality: Dorsal, Right Cleanser: Soap and Water 1 x Per Day/30 Days Discharge Instructions: May shower and wash wound with dial antibacterial soap  and water prior to dressing change. Cleanser: Wound Cleanser 1 x Per Day/30 Days Discharge Instructions: Cleanse the wound with wound cleanser prior to applying a clean dressing using gauze sponges, not tissue or cotton balls. Prim Dressing: Santyl Ointment 1 x Per Day/30 Days ary Discharge Instructions: Apply nickel thick amount to wound bed as instructed Secondary Dressing: Woven Gauze Sponge, Non-Sterile 4x4 in 1 x Per Day/30 Days Discharge Instructions: Apply over primary dressing as directed. Secondary Dressing: ABD Pad, 8x10 1 x Per Day/30 Days Discharge Instructions: Apply over primary dressing as directed. Secured With: Elastic Bandage 4 inch (ACE bandage) 1 x Per Day/30 Days Discharge Instructions: Secure with ACE bandage as directed. Secured With: [employer], 4.5x3.1 (in/yd) 1 x Per Day/30 Days Discharge Instructions: Secure with Kerlix as directed. Secured With: Transpore Surgical Tape, 2x10 (in/yd) 1 x Per Day/30 Days Discharge Instructions: Secure dressing with tape as directed. Wound #3 - Ankle Wound Laterality: Left, Medial Cleanser: Soap and Water 1 x Per Day/30 Days Discharge Instructions: May shower and wash wound with dial antibacterial soap and water prior to dressing change. Cleanser: Wound Cleanser 1 x Per Day/30 Days Discharge Instructions: Cleanse the wound with wound cleanser prior to applying a clean dressing using gauze sponges, not tissue or cotton balls. Prim Dressing: Santyl Ointment 1 x Per Day/30 Days ary Discharge Instructions: Apply nickel thick amount to wound bed as instructed  Secondary Dressing: Woven Gauze Sponge, Non-Sterile 4x4 in 1 x Per Day/30 Days Discharge Instructions: Apply over primary dressing as directed. Secondary Dressing: ABD Pad, 8x10 1 x Per Day/30 Days Discharge Instructions: Apply over primary dressing as directed. Secured With: Elastic Bandage 4 inch (ACE bandage) 1 x Per Day/30 Days Discharge Instructions: Secure with ACE  bandage as directed. Secured With: [employer], 4.5x3.1 (in/yd) 1 x Per Day/30 Days Discharge Instructions: Secure with Kerlix as directed. Secured With: Transpore Surgical Tape, 2x10 (in/yd) 1 x Per Day/30 Days Discharge Instructions: Secure dressing with tape as directed. Wound #4 - Ankle Wound Laterality: Left, Lateral Cleanser: Soap and Water 1 x Per Day/30 Days Discharge Instructions: May shower and wash wound with dial antibacterial soap and water prior to dressing change. Cleanser: Wound Cleanser 1 x Per Day/30 Days Discharge Instructions: Cleanse the wound with wound cleanser prior to applying a clean dressing using gauze sponges, not tissue or cotton balls. Prim Dressing: Santyl Ointment 1 x Per Day/30 Days ary Discharge Instructions: Apply nickel thick amount to wound bed as instructed Secondary Dressing: Woven Gauze Sponge, Non-Sterile 4x4 in 1 x Per Day/30 Days Discharge Instructions: Apply over primary dressing as directed. Secondary Dressing: ABD Pad, 8x10 1 x Per Day/30 Days Discharge Instructions: Apply over primary dressing as directed. Secured With: Elastic Bandage 4 inch (ACE bandage) 1 x Per Day/30 Days Discharge Instructions: Secure with ACE bandage as directed. Secured With: [employer], 4.5x3.1 (in/yd) 1 x Per Day/30 Days Discharge Instructions: Secure with Kerlix as directed. Secured With: Transpore Surgical Tape, 2x10 (in/yd) 1 x Per Day/30 Days Discharge Instructions: Secure dressing with tape as directed. Wound #5 - Foot Wound Laterality: Dorsal, Left Cleanser: Soap and Water 1 x Per Day/30 Days Discharge Instructions: May shower and wash wound with dial antibacterial soap and water prior to dressing change. Cleanser: Wound Cleanser 1 x Per Day/30 Days Discharge Instructions: Cleanse the wound with wound cleanser prior to applying a clean dressing using gauze sponges, not tissue or cotton balls. Electronic Signature(s) Signed: 04/08/2021 1:23:11  PM By: Candace Shan DO Entered By: Candace Robinson on 04/08/2021 11:51:19 -------------------------------------------------------------------------------- Problem List Details Patient Name: Date of Service: Candace Robinson, [name] 04/08/2021 10:45 A M Medical Record Number: [id number] Patient Account Number: [id number] Date of Birth/Sex: Treating RN: 1966/06/30 (55 y.o. Candace Robinson, Candace Robinson Primary Care Provider: Leonie Robinson Other Clinician: Referring Provider: Treating Provider/Extender: Candace Robinson in Treatment: 6 Active Problems ICD-10 Encounter Code Description Active Date MDM Diagnosis [phone] Non-pressure chronic ulcer of other part of right foot with fat layer exposed 02/22/2021 No Yes L97.522 Non-pressure chronic ulcer of other part of left foot with fat layer exposed 02/22/2021 No Yes I73.9 Peripheral vascular disease, unspecified 02/22/2021 No Yes E11.9 Type 2 diabetes mellitus without complications 7/93/9030 No Yes L93.2 Other local lupus erythematosus 02/22/2021 No Yes S92.33 Chronic diastolic (congestive) heart failure 02/22/2021 No Yes I10 Essential (primary) hypertension 02/22/2021 No Yes E11.621 Type 2 diabetes mellitus with foot ulcer 02/22/2021 No Yes Inactive Problems Resolved Problems Electronic Signature(s) Signed: 04/08/2021 1:23:11 PM By: Candace Shan DO Entered By: Candace Robinson on 04/08/2021 11:46:26 -------------------------------------------------------------------------------- Progress Note Details Patient Name: Date of Service: Candace Robinson, [name] 04/08/2021 10:45 A M Medical Record Number: [id number] Patient Account Number: [id number] Date of Birth/Sex: Treating RN: March 05, 1966 (55 y.o. Candace Robinson, Candace Robinson Primary Care Provider: Leonie Robinson Other Clinician: Referring Provider: Treating Provider/Extender: [name] Weeks in  Treatment: 6 Subjective Chief  Complaint Information obtained from Patient Bilateral feet wounds History of Present Illness (HPI) Admission 8/22 Ms. Theda Payer is a 55 year old female with a past medical history of diet-controlled type 2 diabetes, hypothyroidism, chronic diastolic heart failure, cutaneous lupus erythematosus and essential hypertension that presents to the clinic for a 50-month history of worsening bilateral feet ulcers. She states that wounds to her feet have been an ongoing issue for several years. She states that with wound care they improve however they tend to wax and wane. 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 She was discharged to a nursing facility and she was provided wound care for her bilateral feet ulcers at that time. She states they used Silvadene and silver alginate. She states that when she was discharged in April from the nursing facility her wounds had improved but were not fully closed. She currently uses Silvadene but has not been using silver alginate for several months due to running out. She reports significant decline in her wound healing over the last 4 months. She reports pain to the wound sites. She currently denies increased warmth erythema or purulent drainage. 8/29; patient presents for 1 week follow-up. She states she had ABIs completed without TBI's. She continues to use silver alginate with dressing changes. She reports continued pain. She denies signs of infection. 9/8; patient presents for 1 week follow-up. She was evaluated by Dr. Donzetta Matters and she reports follow-up with him in 1 month. She has been using Santyl to the wound beds. She reports some improvement in wound healing. She denies signs of infection. 9/15; patient presents for follow-up. She continues to use Santyl and silver alginate to the wound beds. She states the alginate is sticking and hard to remove with dressing changes. She currently denies signs of infection. 9/22; patient  presents for 1 week follow-up. She is using Santyl and silver alginate to the wound beds. She ran out of [place] and has been using Silvadene. She states that gentamicin ointment is ready at her pharmacy and will be picking this up today. She has no issues or complaints today. She denies signs of infection. 9/30; she is using Santyl and silver alginate. She has substantial wounds on her bilateral feet and bilateral ankles that have been there for a year. She is in a lot of pain. She cannot bear to have these wounds touched let alone a mechanical debridement here. Furthermore the etiology of this is really not totally clear. She had reasonably normal ABIs with triphasic waveforms in August [I did not review this however]. She is completing a course of ciprofloxacin for an area on the left lateral ankle. She has had prior ablations and follows with vascular surgery. Finally she apparently has lupus 10/6; patient has been using Silvadene to the wound beds with dressing changes. She states she is picking up Santyl today. She has no issues or complaints today. She denies signs of infection. Patient History Information obtained from Patient. Family History Unknown History. Social History Never smoker, Marital Status - Single, Alcohol Use - Never, Drug Use - No History, Caffeine Use - Rarely. Medical History Hematologic/Lymphatic Patient has history of Anemia Cardiovascular Patient has history of Arrhythmia - A-Fib, Congestive Heart Failure, Hypertension Denies history of Angina Endocrine Patient has history of Type II Diabetes Immunological Patient has history of Lupus Erythematosus Medical A Surgical History Notes nd Endocrine Grave's disease Objective Constitutional respirations regular, non-labored and within target range for patient.. Vitals Time Taken:  11:08 AM, Height: 63 in, Weight: 157 lbs, BMI: 27.8, Temperature: 98.8 F, Pulse: 74 bpm, Respiratory Rate: 17 breaths/min,  Blood Pressure: 145/85 mmHg. Cardiovascular 2+ dorsalis pedis/posterior tibialis pulses. Psychiatric pleasant and cooperative. General Notes: Wounds throughout feet bilaterally with mostly nonviable tissue and scant granulation tissue present. Integumentary (Hair, Skin) Wound #1 status is Open. Original cause of wound was Gradually Appeared. The date acquired was: 04/03/2020. The wound has been in treatment 6 weeks. The wound is located on the Right,Medial Ankle. The wound measures 5.5cm length x 4.9cm width x 0.3cm depth; 21.166cm^2 area and 6.35cm^3 volume. There is Fat Layer (Subcutaneous Tissue) exposed. There is no tunneling or undermining noted. There is a medium amount of serosanguineous drainage noted. The wound margin is distinct with the outline attached to the wound base. There is medium (34-66%) red granulation within the wound bed. There is a medium (34- 66%) amount of necrotic tissue within the wound bed including Adherent Slough. Wound #2 status is Open. Original cause of wound was Gradually Appeared. The date acquired was: 04/03/2020. The wound has been in treatment 6 weeks. The wound is located on the Right,Dorsal Foot. The wound measures 5cm length x 5.5cm width x 0.2cm depth; 21.598cm^2 area and 4.32cm^3 volume. There is Fat Layer (Subcutaneous Tissue) exposed. There is no tunneling or undermining noted. There is a medium amount of serosanguineous drainage noted. The wound margin is distinct with the outline attached to the wound base. There is medium (34-66%) red granulation within the wound bed. There is a medium (34-66%) amount of necrotic tissue within the wound bed including Adherent Slough. Wound #3 status is Open. Original cause of wound was Gradually Appeared. The date acquired was: 02/22/2021. The wound has been in treatment 6 weeks. The wound is located on the Left,Medial Ankle. The wound measures 1cm length x 2cm width x 0.2cm depth; 1.571cm^2 area and 0.314cm^3 volume.  There is Fat Layer (Subcutaneous Tissue) exposed. There is no tunneling or undermining noted. There is a medium amount of serosanguineous drainage noted. The wound margin is distinct with the outline attached to the wound base. There is large (67-100%) red granulation within the wound bed. There is a small (1-33%) amount of necrotic tissue within the wound bed including Adherent Slough. Wound #4 status is Open. Original cause of wound was Gradually Appeared. The date acquired was: 04/03/2020. The wound has been in treatment 6 weeks. The wound is located on the Left,Lateral Ankle. The wound measures 7.5cm length x 4.5cm width x 0.2cm depth; 26.507cm^2 area and 5.301cm^3 volume. There is Fat Layer (Subcutaneous Tissue) exposed. There is no tunneling or undermining noted. There is a medium amount of serosanguineous drainage noted. The wound margin is distinct with the outline attached to the wound base. There is medium (34-66%) red granulation within the wound bed. There is a medium (34- 66%) amount of necrotic tissue within the wound bed including Adherent Slough. Wound #5 status is Open. Original cause of wound was Gradually Appeared. The date acquired was: 04/03/2020. The wound has been in treatment 6 weeks. The wound is located on the Left,Dorsal Foot. The wound measures 10cm length x 9cm width x 0.2cm depth; 70.686cm^2 area and 14.137cm^3 volume. There is Fat Layer (Subcutaneous Tissue) exposed. There is no tunneling or undermining noted. There is a medium amount of serosanguineous drainage noted. The wound margin is distinct with the outline attached to the wound base. There is medium (34-66%) red granulation within the wound bed. There is a medium (34-  66%) amount of necrotic tissue within the wound bed including Adherent Slough. Wound #6 status is Open. Original cause of wound was Gradually Appeared. The date acquired was: 04/03/2020. The wound has been in treatment 6 weeks. The wound is located on  the Left,Distal,Medial Foot. The wound measures 0.5cm length x 0.5cm width x 0.1cm depth; 0.196cm^2 area and 0.02cm^3 volume. There is Fat Layer (Subcutaneous Tissue) exposed. There is no tunneling or undermining noted. There is a medium amount of serosanguineous drainage noted. The wound margin is distinct with the outline attached to the wound base. There is medium (34-66%) red, pink granulation within the wound bed. There is a small (1- 33%) amount of necrotic tissue within the wound bed including Adherent Slough. Wound #7 status is Open. Original cause of wound was Gradually Appeared. The date acquired was: 04/08/2021. The wound is located on the Right,Lateral Ankle. The wound measures 0.2cm length x 0.2cm width x 0.1cm depth; 0.031cm^2 area and 0.003cm^3 volume. There is no tunneling or undermining noted. There is a medium amount of serosanguineous drainage noted. The wound margin is distinct with the outline attached to the wound base. There is medium (34- 66%) red, pink granulation within the wound bed. There is a medium (34-66%) amount of necrotic tissue within the wound bed including Adherent Slough. Assessment Active Problems ICD-10 Non-pressure chronic ulcer of other part of right foot with fat layer exposed Non-pressure chronic ulcer of other part of left foot with fat layer exposed Peripheral vascular disease, unspecified Type 2 diabetes mellitus without complications Other local lupus erythematosus Chronic diastolic (congestive) heart failure Essential (primary) hypertension Type 2 diabetes mellitus with foot ulcer Wounds are stable. I debrided nonviable tissue. I recommended starting Santyl and using this daily to the wound beds. She has follow-up with [facility], Dr. Donzetta Matters on 10/19. He does not think there is an arterial insufficiency component to explain poor wound healing. She also has a history of lupus and I asked her to follow-up with her dermatologist to assess if these wounds could  be a flare up of her autoimmune disease. Follow-up in 1 week Procedures Wound #1 Pre-procedure diagnosis of Wound #1 is a Diabetic Wound/Ulcer of the Lower Extremity located on the Right,Medial Ankle .Severity of Tissue Pre Debridement is: Fat layer exposed. There was a Excisional Skin/Subcutaneous Tissue Debridement with a total area of 26.95 sq cm performed by Candace Shan, DO. With the following instrument(s): Curette to remove Viable and Non-Viable tissue/material. Material removed includes Subcutaneous Tissue, Slough, Skin: Dermis, and Skin: Epidermis after achieving pain control using Lidocaine. No specimens were taken. A time out was conducted at 11:34, prior to the start of the procedure. A Minimum amount of bleeding was controlled with Pressure. The procedure was tolerated well with a pain level of 0 throughout and a pain level of 0 following the procedure. Post Debridement Measurements: 5.5cm length x 4.9cm width x 0.3cm depth; 6.35cm^3 volume. Character of Wound/Ulcer Post Debridement is improved. Severity of Tissue Post Debridement is: Fat layer exposed. Post procedure Diagnosis Wound #1: Same as Pre-Procedure Wound #2 Pre-procedure diagnosis of Wound #2 is a Diabetic Wound/Ulcer of the Lower Extremity located on the Right,Dorsal Foot .Severity of Tissue Pre Debridement is: Fat layer exposed. There was a Excisional Skin/Subcutaneous Tissue Debridement with a total area of 27.5 sq cm performed by Candace Shan, DO. With the following instrument(s): Curette to remove Viable and Non-Viable tissue/material. Material removed includes Subcutaneous Tissue, Slough, Skin: Dermis, and Skin: Epidermis after achieving pain control using  Lidocaine. No specimens were taken. A time out was conducted at 11:34, prior to the start of the procedure. A Minimum amount of bleeding was controlled with Pressure. The procedure was tolerated well with a pain level of 0 throughout and a pain level of 0  following the procedure. Post Debridement Measurements: 5cm length x 5.5cm width x 0.2cm depth; 4.32cm^3 volume. Character of Wound/Ulcer Post Debridement is improved. Severity of Tissue Post Debridement is: Fat layer exposed. Post procedure Diagnosis Wound #2: Same as Pre-Procedure Wound #4 Pre-procedure diagnosis of Wound #4 is a Diabetic Wound/Ulcer of the Lower Extremity located on the Left,Lateral Ankle .Severity of Tissue Pre Debridement is: Fat layer exposed. There was a Excisional Skin/Subcutaneous Tissue Debridement with a total area of 33.75 sq cm performed by Candace Shan, DO. With the following instrument(s): Curette to remove Viable and Non-Viable tissue/material. Material removed includes Subcutaneous Tissue, Slough, Skin: Dermis, and Skin: Epidermis after achieving pain control using Lidocaine. No specimens were taken. A time out was conducted at 11:34, prior to the start of the procedure. A Minimum amount of bleeding was controlled with Pressure. The procedure was tolerated well with a pain level of 0 throughout and a pain level of 0 following the procedure. Post Debridement Measurements: 7.5cm length x 4.5cm width x 0.2cm depth; 5.301cm^3 volume. Character of Wound/Ulcer Post Debridement is improved. Severity of Tissue Post Debridement is: Fat layer exposed. Post procedure Diagnosis Wound #4: Same as Pre-Procedure Wound #5 Pre-procedure diagnosis of Wound #5 is a Diabetic Wound/Ulcer of the Lower Extremity located on the Left,Dorsal Foot .Severity of Tissue Pre Debridement is: Fat layer exposed. There was a Excisional Skin/Subcutaneous Tissue Debridement with a total area of 90 sq cm performed by Candace Shan, DO. With the following instrument(s): Curette to remove Viable and Non-Viable tissue/material. Material removed includes Subcutaneous Tissue, Slough, Skin: Dermis, and Skin: Epidermis after achieving pain control using Lidocaine. No specimens were taken. A time out was  conducted at 11:34, prior to the start of the procedure. A Minimum amount of bleeding was controlled with Pressure. The procedure was tolerated well with a pain level of 0 throughout and a pain level of 0 following the procedure. Post Debridement Measurements: 10cm length x 9cm width x 0.2cm depth; 14.137cm^3 volume. Character of Wound/Ulcer Post Debridement is improved. Severity of Tissue Post Debridement is: Fat layer exposed. Post procedure Diagnosis Wound #5: Same as Pre-Procedure Plan Follow-up Appointments: Return Appointment in 1 week. - any day any doctor ***EXTRA TIME 75 MINUTES*** Other: - [facility]=Supplies Bathing/ Shower/ Hygiene: May shower and wash wound with soap and water. - when dressings changed Edema Control - Lymphedema / SCD / Other: Elevate legs to the level of the heart or above for 30 minutes daily and/or when sitting, a frequency of: - throughout the day Avoid standing for long periods of time. Additional Orders / Instructions: Follow Nutritious Diet - 100-120g of protein daily WOUND #1: - Ankle Wound Laterality: Right, Medial Cleanser: Soap and Water 1 x Per Day/30 Days Discharge Instructions: May shower and wash wound with dial antibacterial soap and water prior to dressing change. Cleanser: Wound Cleanser 1 x Per Day/30 Days Discharge Instructions: Cleanse the wound with wound cleanser prior to applying a clean dressing using gauze sponges, not tissue or cotton balls. Prim Dressing: Santyl Ointment 1 x Per Day/30 Days ary Discharge Instructions: Apply nickel thick amount to wound bed as instructed Secondary Dressing: Woven Gauze Sponge, Non-Sterile 4x4 in 1 x Per Day/30 Days Discharge Instructions:  Apply over primary dressing as directed. Secondary Dressing: ABD Pad, 8x10 1 x Per Day/30 Days Discharge Instructions: Apply over primary dressing as directed. Secured With: Elastic Bandage 4 inch (ACE bandage) 1 x Per Day/30 Days Discharge Instructions: Secure  with ACE bandage as directed. Secured With: [employer], 4.5x3.1 (in/yd) 1 x Per Day/30 Days Discharge Instructions: Secure with Kerlix as directed. Secured With: [employer] T ape, 2x10 (in/yd) 1 x Per Day/30 Days Discharge Instructions: Secure dressing with tape as directed. WOUND #2: - Foot Wound Laterality: Dorsal, Right Cleanser: Soap and Water 1 x Per Day/30 Days Discharge Instructions: May shower and wash wound with dial antibacterial soap and water prior to dressing change. Cleanser: Wound Cleanser 1 x Per Day/30 Days Discharge Instructions: Cleanse the wound with wound cleanser prior to applying a clean dressing using gauze sponges, not tissue or cotton balls. Prim Dressing: Santyl Ointment 1 x Per Day/30 Days ary Discharge Instructions: Apply nickel thick amount to wound bed as instructed Secondary Dressing: Woven Gauze Sponge, Non-Sterile 4x4 in 1 x Per Day/30 Days Discharge Instructions: Apply over primary dressing as directed. Secondary Dressing: ABD Pad, 8x10 1 x Per Day/30 Days Discharge Instructions: Apply over primary dressing as directed. Secured With: Elastic Bandage 4 inch (ACE bandage) 1 x Per Day/30 Days Discharge Instructions: Secure with ACE bandage as directed. Secured With: [employer], 4.5x3.1 (in/yd) 1 x Per Day/30 Days Discharge Instructions: Secure with Kerlix as directed. Secured With: [employer] T ape, 2x10 (in/yd) 1 x Per Day/30 Days Discharge Instructions: Secure dressing with tape as directed. WOUND #3: - Ankle Wound Laterality: Left, Medial Cleanser: Soap and Water 1 x Per Day/30 Days Discharge Instructions: May shower and wash wound with dial antibacterial soap and water prior to dressing change. Cleanser: Wound Cleanser 1 x Per Day/30 Days Discharge Instructions: Cleanse the wound with wound cleanser prior to applying a clean dressing using gauze sponges, not tissue or cotton balls. Prim Dressing: Santyl Ointment 1 x Per  Day/30 Days ary Discharge Instructions: Apply nickel thick amount to wound bed as instructed Secondary Dressing: Woven Gauze Sponge, Non-Sterile 4x4 in 1 x Per Day/30 Days Discharge Instructions: Apply over primary dressing as directed. Secondary Dressing: ABD Pad, 8x10 1 x Per Day/30 Days Discharge Instructions: Apply over primary dressing as directed. Secured With: Elastic Bandage 4 inch (ACE bandage) 1 x Per Day/30 Days Discharge Instructions: Secure with ACE bandage as directed. Secured With: [employer], 4.5x3.1 (in/yd) 1 x Per Day/30 Days Discharge Instructions: Secure with Kerlix as directed. Secured With: [employer] T ape, 2x10 (in/yd) 1 x Per Day/30 Days Discharge Instructions: Secure dressing with tape as directed. WOUND #4: - Ankle Wound Laterality: Left, Lateral Cleanser: Soap and Water 1 x Per Day/30 Days Discharge Instructions: May shower and wash wound with dial antibacterial soap and water prior to dressing change. Cleanser: Wound Cleanser 1 x Per Day/30 Days Discharge Instructions: Cleanse the wound with wound cleanser prior to applying a clean dressing using gauze sponges, not tissue or cotton balls. Prim Dressing: Santyl Ointment 1 x Per Day/30 Days ary Discharge Instructions: Apply nickel thick amount to wound bed as instructed Secondary Dressing: Woven Gauze Sponge, Non-Sterile 4x4 in 1 x Per Day/30 Days Discharge Instructions: Apply over primary dressing as directed. Secondary Dressing: ABD Pad, 8x10 1 x Per Day/30 Days Discharge Instructions: Apply over primary dressing as directed. Secured With: Elastic Bandage 4 inch (ACE bandage) 1 x Per Day/30 Days Discharge Instructions: Secure with ACE bandage  as directed. Secured With: [employer], 4.5x3.1 (in/yd) 1 x Per Day/30 Days Discharge Instructions: Secure with Kerlix as directed. Secured With: [employer] T ape, 2x10 (in/yd) 1 x Per Day/30 Days Discharge Instructions: Secure dressing  with tape as directed. WOUND #5: - Foot Wound Laterality: Dorsal, Left Cleanser: Soap and Water 1 x Per Day/30 Days Discharge Instructions: May shower and wash wound with dial antibacterial soap and water prior to dressing change. Cleanser: Wound Cleanser 1 x Per Day/30 Days Discharge Instructions: Cleanse the wound with wound cleanser prior to applying a clean dressing using gauze sponges, not tissue or cotton balls. 1. Santyl daily 2. Follow-up in 1 week Electronic Signature(s) Signed: 04/08/2021 1:23:11 PM By: Candace Shan DO Entered By: Candace Robinson on 04/08/2021 11:55:26 -------------------------------------------------------------------------------- HxROS Details Patient Name: Date of Service: Candace Robinson, [name] 04/08/2021 10:45 A M Medical Record Number: [id number] Patient Account Number: [id number] Date of Birth/Sex: Treating RN: 1965/10/31 (55 y.o. Candace Robinson, Candace Robinson Primary Care Provider: Leonie Robinson Other Clinician: Referring Provider: Treating Provider/Extender: Candace Robinson in Treatment: 6 Information Obtained From Patient Hematologic/Lymphatic Medical History: Positive for: Anemia Cardiovascular Medical History: Positive for: Arrhythmia - A-Fib; Congestive Heart Failure; Hypertension Negative for: Angina Endocrine Medical History: Positive for: Type II Diabetes Past Medical History Notes: Grave's disease Time with diabetes: over 10 years Treated with: Diet Blood sugar tested every day: No Immunological Medical History: Positive for: Lupus Erythematosus Immunizations Pneumococcal Vaccine: Received Pneumococcal Vaccination: No Implantable Devices None Family and Social History Unknown History: Yes; Never smoker; Marital Status - Single; Alcohol Use: Never; Drug Use: No History; Caffeine Use: Rarely; Financial Concerns: No; Food, Clothing or Shelter Needs: No; Support System Lacking: No; Transportation  Concerns: No Electronic Signature(s) Signed: 04/08/2021 1:23:11 PM By: Candace Shan DO Signed: 04/13/2021 5:02:53 PM By: Candace Hammock RN Entered By: Candace Robinson on 04/08/2021 11:48:19 -------------------------------------------------------------------------------- SuperBill Details Patient Name: Date of Service: Candace Robinson, [name] 04/08/2021 Medical Record Number: [id number] Patient Account Number: [id number] Date of Birth/Sex: Treating RN: 1966-01-02 (55 y.o. Candace Robinson, Candace Robinson Primary Care Provider: Leonie Robinson Other Clinician: Referring Provider: Treating Provider/Extender: Candace Robinson in Treatment: 6 Diagnosis Coding ICD-10 Codes Code Description [id number] Non-pressure chronic ulcer of other part of right foot with fat layer exposed L97.522 Non-pressure chronic ulcer of other part of left foot with fat layer exposed I73.9 Peripheral vascular disease, unspecified E11.9 Type 2 diabetes mellitus without complications S97.0 Other local lupus erythematosus Y63.78 Chronic diastolic (congestive) heart failure I10 Essential (primary) hypertension E11.621 Type 2 diabetes mellitus with foot ulcer Facility Procedures CPT4 Code: [id number] Description: 11042 - DEB SUBQ TISSUE 20 SQ CM/< ICD-10 Diagnosis Description L97.512 Non-pressure chronic ulcer of other part of right foot with fat layer exposed L97.522 Non-pressure chronic ulcer of other part of left foot with fat layer exposed E11.9  Type 2 diabetes mellitus without complications A12.878 Type 2 diabetes mellitus with foot ulcer Modifier: Quantity: 1 CPT4 Code: [id number] Description: 11045 - DEB SUBQ TISS EA ADDL 20CM ICD-10 Diagnosis Description L97.522 Non-pressure chronic ulcer of other part of left foot with fat layer exposed L97.512 Non-pressure chronic ulcer of other part of right foot with fat layer exposed E11.9  Type 2 diabetes mellitus without complications B09.628 Type 2  diabetes mellitus with foot ulcer Modifier: Quantity: 8 Physician Procedures : CPT4 Code Description Modifier [id number] 11042 - WC PHYS SUBQ TISS 20 SQ CM ICD-10 Diagnosis Description L97.512 Non-pressure chronic  ulcer of other part of right foot with fat layer exposed L97.522 Non-pressure chronic ulcer of other part of left foot  with fat layer exposed E11.9 Type 2 diabetes mellitus without complications G92.119 Type 2 diabetes mellitus with foot ulcer Quantity: 1 : [id number] 11045 - WC PHYS SUBQ TISS EA ADDL 20 CM ICD-10 Diagnosis Description L97.522 Non-pressure chronic ulcer of other part of left foot with fat layer exposed L97.512 Non-pressure chronic ulcer of other part of right foot with fat layer exposed  E11.9 Type 2 diabetes mellitus without complications K48.185 Type 2 diabetes mellitus with foot ulcer Quantity: 8 Electronic Signature(s) Signed: 04/08/2021 1:23:11 PM By: Candace Shan DO Entered By: Candace Robinson on 04/08/2021 11:56:01

## 2021-04-13 NOTE — Progress Notes (Signed)
SARI, COGAN ([id number]) Visit Report for 04/08/2021 Arrival Information Details Patient Name: Date of Service: Candace Robinson, Candace Robinson. 04/08/2021 10:45 A M Medical Record Number: [id number] Patient Account Number: [id number] Date of Birth/Sex: Treating RN: [birth year] (55 y.o. Candace Robinson, Candace Robinson Primary Care Tamyah Cutbirth: [name] Other Clinician: Referring Helaman Mecca: Treating Reginaldo Hazard/Extender: Darcey Nora in Treatment: 6 Visit Information History Since Last Visit Added or deleted any medications: No Patient Arrived: [name] Any new allergies or adverse reactions: No Arrival Time: 11:08 Had a fall or experienced change in No Accompanied By: self activities of daily living that may affect Transfer Assistance: None risk of falls: Patient Identification Verified: Yes Signs or symptoms of abuse/neglect since last visito No Secondary Verification Process Completed: Yes Hospitalized since last visit: No Patient Requires Transmission-Based No Implantable device outside of the clinic excluding No Precautions: cellular tissue based products placed in the center Patient Has Alerts: Yes since last visit: Patient Alerts: R ABI: 1.0 Robinson ABI: 1.1 Has Dressing in Place as Prescribed: Yes NO BENZOCAINE Pain Present Now: No USE LIDOCAINE GEL ONLY Electronic Signature(s) Signed: 04/13/2021 5:02:53 PM By: [name] RN Entered By: [name] on 04/08/2021 11:08:30 -------------------------------------------------------------------------------- Encounter Discharge Information Details Patient Name: Date of Service: Candace A [name], Candace Robinson. 04/08/2021 10:45 A M Medical Record Number: [id number] Patient Account Number: [id number] Date of Birth/Sex: Treating RN: [birth year] (55 y.o. Candace Robinson, Candace Robinson Primary Care Marshea Wisher: [name] Other Clinician: Referring Ellisyn Icenhower: Treating Draden Cottingham/Extender: Darcey Nora in Treatment: 6 Encounter Discharge Information Items Discharge Condition: Stable Ambulatory Status: Walker Discharge Destination: Home Transportation: Private Auto Accompanied By: self Schedule Follow-up Appointment: Yes Clinical Summary of Care: Patient Declined Electronic Signature(s) Signed: 04/13/2021 5:02:53 PM By: [name] RN Entered By: [name] on 04/08/2021 11:32:37 -------------------------------------------------------------------------------- Lower Extremity Assessment Details Patient Name: Date of Service: Candace Robinson, Candace Robinson. 04/08/2021 10:45 A M Medical Record Number: [id number] Patient Account Number: [id number] Date of Birth/Sex: Treating RN: [birth year] (55 y.o. Candace Robinson, Candace Robinson Primary Care Tanya Crothers: [name] Other Clinician: Referring Cypress Hinkson: Treating Oberia Beaudoin/Extender: [name] Weeks in Treatment: 6 Edema Assessment Assessed: [name]: Yes] [name]: Yes] Edema: [Left: Yes] [Right: Yes] Calf Left: Right: Point of Measurement: 29 cm From Medial Instep 35.5 cm 35 cm Ankle Left: Right: Point of Measurement: 10 cm From Medial Instep 24 cm 24 cm Vascular Assessment Pulses: Dorsalis Pedis Palpable: [Left:Yes] [Right:Yes] Posterior Tibial Palpable: [Left:Yes] [Right:Yes] Electronic Signature(s) Signed: 04/13/2021 5:02:53 PM By: [name] RN Entered By: [name] on 04/08/2021 11:09:02 -------------------------------------------------------------------------------- Multi Wound Chart Details Patient Name: Date of Service: Candace A [name], Candace Robinson. 04/08/2021 10:45 A M Medical Record Number: [id number] Patient Account Number: [id number] Date of Birth/Sex: Treating RN: [birth year] (55 y.o. Candace Robinson, Candace Robinson Primary Care Rether Rison: [name] Other Clinician: Referring Pihu Basil: Treating Gracielynn Birkel/Extender: [name] Weeks in Treatment:  6 Vital Signs Height(in): 32 Pulse(bpm): 67 Weight(lbs): 157 Blood Pressure(mmHg): 145/85 Body Mass Index(BMI): 28 Temperature(F): 98.8 Respiratory Rate(breaths/min): 17 Photos: [1:No Photos Right, Medial Ankle] [2:No Photos Right, Dorsal Foot] [3:No Photos Left, Medial Ankle] Wound Location: [1:Gradually Appeared] [2:Gradually Appeared] [3:Gradually Appeared] Wounding Event: [1:Diabetic Wound/Ulcer of the Lower] [2:Diabetic Wound/Ulcer of the Lower] [3:Diabetic Wound/Ulcer of the Lower] Primary Etiology: [1:Extremity Anemia, Arrhythmia, Congestive Heart Anemia, Arrhythmia, Congestive Heart Anemia, Arrhythmia, Congestive Heart] [2:Extremity] [3:Extremity] Comorbid History: [1:Failure, Hypertension, Type II Diabetes, Lupus Erythematosus 04/03/2020] [2:Failure, Hypertension, Type II  Diabetes, Lupus Erythematosus 04/03/2020] [3:Failure, Hypertension, Type II Diabetes, Lupus Erythematosus 02/22/2021] Date Acquired: [1:6] [2:6] [3:6] Weeks of Treatment: [1:Open] [2:Open] [3:Open] Wound Status: [1:5.5x4.9x0.3] [2:5x5.5x0.2] [3:1x2x0.2] Measurements Robinson Robinson [name] D (cm) [1:21.166] [2:21.598] [3:1.571] A (cm) : rea [1:6.35] [2:4.32] [3:0.314] Volume (cm) : [1:-28.30%] [2:-83.30%] [3:20.00%] % Reduction in A rea: [1:-92.50%] [2:-266.70%] [3:20.10%] % Reduction in Volume: [1:Grade 2] [2:Grade 2] [3:Grade 2] Classification: [1:Medium] [2:Medium] [3:Medium] Exudate A mount: [1:Serosanguineous] [2:Serosanguineous] [3:Serosanguineous] Exudate Type: [1:red, brown] [2:red, brown] [3:red, brown] Exudate Color: [1:Distinct, outline attached] [2:Distinct, outline attached] [3:Distinct, outline attached] Wound Margin: [1:Medium (34-66%)] [2:Medium (34-66%)] [3:Large (67-100%)] Granulation A mount: [1:Red] [2:Red] [3:Red] Granulation Quality: [1:Medium (34-66%)] [2:Medium (34-66%)] [3:Small (1-33%)] Necrotic A mount: [1:Fat Layer (Subcutaneous Tissue): Yes Fat Layer (Subcutaneous Tissue): Yes Fat Layer  (Subcutaneous Tissue): Yes] Exposed Structures: [1:Fascia: No Tendon: No Muscle: No Joint: No Bone: No None] [2:Fascia: No Tendon: No Muscle: No Joint: No Bone: No None] [3:Fascia: No Tendon: No Muscle: No Joint: No Bone: No Medium (34-66%)] Epithelialization: [1:Debridement - Excisional] [2:Debridement - Excisional] [3:N/A] Debridement: Pre-procedure Verification/Time Out 11:34 [2:11:34] [3:N/A] Taken: [1:Lidocaine] [2:Lidocaine] [3:N/A] Pain Control: [1:Subcutaneous, Slough] [2:Subcutaneous, Slough] [3:N/A] Tissue Debrided: [1:Skin/Subcutaneous Tissue] [2:Skin/Subcutaneous Tissue] [3:N/A] Level: [1:26.95] [2:27.5] [3:N/A] Debridement A (sq cm): [1:rea Curette] [2:Curette] [3:N/A] Instrument: [1:Minimum] [2:Minimum] [3:N/A] Bleeding: [1:Pressure] [2:Pressure] [3:N/A] Hemostasis A chieved: [1:0] [2:0] [3:N/A] Procedural Pain: [1:0] [2:0] [3:N/A] Post Procedural Pain: [1:Procedure was tolerated well] [2:Procedure was tolerated well] [3:N/A] Debridement Treatment Response: [1:5.5x4.9x0.3] [2:5x5.5x0.2] [3:N/A] Post Debridement Measurements Robinson Robinson [name] D (cm) [1:6.35] [2:4.32] [3:N/A] Post Debridement Volume: (cm) [1:Debridement] [2:Debridement] [3:N/A] Wound Number: 4 5 6  Photos: No Photos No Photos No Photos Left, Lateral Ankle Left, Dorsal Foot Left, Distal, Medial Foot Wound Location: Gradually Appeared Gradually Appeared Gradually Appeared Wounding Event: Diabetic Wound/Ulcer of the Lower Diabetic Wound/Ulcer of the Lower Diabetic Wound/Ulcer of the Lower Primary Etiology: Extremity Extremity Extremity Anemia, Arrhythmia, Congestive Heart Anemia, Arrhythmia, Congestive Heart Anemia, Arrhythmia, Congestive Heart Comorbid History: Failure, Hypertension, Type II Failure, Hypertension, Type II Failure, Hypertension, Type II Diabetes, Lupus Erythematosus Diabetes, Lupus Erythematosus Diabetes, Lupus Erythematosus 04/03/2020 04/03/2020 04/03/2020 Date Acquired: 6 6 6  Weeks of  Treatment: Open Open Open Wound Status: 7.5x4.5x0.2 10x9x0.2 0.5x0.5x0.1 Measurements Robinson Robinson [name] D (cm) 26.507 70.686 0.196 A (cm) : rea 5.301 14.137 0.02 Volume (cm) : 29.70% -17.60% 37.60% % Reduction in A rea: 29.70% -135.30% 35.50% % Reduction in Volume: Grade 2 Grade 2 Grade 2 Classification: Medium Medium Medium Exudate A mount: Serosanguineous Serosanguineous Serosanguineous Exudate Type: red, brown red, brown red, brown Exudate Color: Distinct, outline attached Distinct, outline attached Distinct, outline attached Wound Margin: Medium (34-66%) Medium (34-66%) Medium (34-66%) Granulation A mount: Red Red Red, Pink Granulation Quality: Medium (34-66%) Medium (34-66%) Small (1-33%) Necrotic A mount: Fat Layer (Subcutaneous Tissue): Yes Fat Layer (Subcutaneous Tissue): Yes Fat Layer (Subcutaneous Tissue): Yes Exposed Structures: Fascia: No Fascia: No Fascia: No Tendon: No Tendon: No Tendon: No Muscle: No Muscle: No Muscle: No Joint: No Joint: No Joint: No Bone: No Bone: No Bone: No Small (1-33%) None Medium (34-66%) Epithelialization: Debridement - Excisional Debridement - Excisional N/A Debridement: Pre-procedure Verification/Time Out 11:34 11:34 N/A Taken: Lidocaine Lidocaine N/A Pain Control: Subcutaneous, Slough Subcutaneous, Slough N/A Tissue Debrided: Skin/Subcutaneous Tissue Skin/Subcutaneous Tissue N/A Level: 33.75 90 N/A Debridement A (sq cm): rea Curette Curette N/A Instrument: Minimum Minimum N/A Bleeding: Pressure Pressure N/A Hemostasis A chieved: 0 0 N/A Procedural Pain: 0 0 N/A Post Procedural Pain: Procedure was  tolerated well Procedure was tolerated well N/A Debridement Treatment Response: 7.5x4.5x0.2 10x9x0.2 N/A Post Debridement Measurements Robinson Robinson [name] D (cm) 5.301 14.137 N/A Post Debridement Volume: (cm) Debridement Debridement N/A Procedures Performed: Wound Number: 7 N/A N/A Photos: No Photos N/A N/A Right,  Lateral Ankle N/A N/A Wound Location: Gradually Appeared N/A N/A Wounding Event: Lupus N/A N/A Primary Etiology: Anemia, Arrhythmia, Congestive Heart N/A N/A Comorbid History: Failure, Hypertension, Type II Diabetes, Lupus Erythematosus 04/08/2021 N/A N/A Date A cquired: 0 N/A N/A Weeks of Treatment: Open N/A N/A Wound Status: 0.2x0.2x0.1 N/A N/A Measurements Robinson Robinson [name] D (cm) 0.031 N/A N/A A (cm) : rea 0.003 N/A N/A Volume (cm) : N/A N/A N/A % Reduction in A rea: N/A N/A N/A % Reduction in Volume: Full Thickness Without Exposed N/A N/A Classification: Support Structures Medium N/A N/A Exudate A mount: Serosanguineous N/A N/A Exudate Type: red, brown N/A N/A Exudate Color: Distinct, outline attached N/A N/A Wound Margin: Medium (34-66%) N/A N/A Granulation A mount: Red, Pink N/A N/A Granulation Quality: Medium (34-66%) N/A N/A Necrotic A mount: Fascia: No N/A N/A Exposed Structures: Fat Layer (Subcutaneous Tissue): No Tendon: No Muscle: No Joint: No Bone: No None N/A N/A Epithelialization: N/A N/A N/A Debridement: N/A N/A N/A Pain Control: N/A N/A N/A Tissue Debrided: N/A N/A N/A Level: N/A N/A N/A Debridement A (sq cm): rea N/A N/A N/A Instrument: N/A N/A N/A Bleeding: N/A N/A N/A Hemostasis A chieved: N/A N/A N/A Procedural Pain: N/A N/A N/A Post Procedural Pain: Debridement Treatment Response: N/A N/A N/A Post Debridement Measurements Robinson Robinson N/A N/A N/A [name] D (cm) N/A N/A N/A Post Debridement Volume: (cm) N/A N/A N/A Procedures Performed: Treatment Notes Wound #1 (Ankle) Wound Laterality: Right, Medial Cleanser Soap and Water Discharge Instruction: May shower and wash wound with dial antibacterial soap and water prior to dressing change. Wound Cleanser Discharge Instruction: Cleanse the wound with wound cleanser prior to applying a clean dressing using gauze sponges, not tissue or cotton balls. Peri-Wound Care Topical Primary  Dressing Santyl Ointment Discharge Instruction: Apply nickel thick amount to wound bed as instructed Secondary Dressing Woven Gauze Sponge, Non-Sterile 4x4 in Discharge Instruction: Apply over primary dressing as directed. ABD Pad, 8x10 Discharge Instruction: Apply over primary dressing as directed. Secured With Elastic Bandage 4 inch (ACE bandage) Discharge Instruction: Secure with ACE bandage as directed. Kerlix Roll Sterile, 4.5x3.1 (in/yd) Discharge Instruction: Secure with Kerlix as directed. Transpore Surgical Tape, 2x10 (in/yd) Discharge Instruction: Secure dressing with tape as directed. Compression Wrap Compression Stockings Add-Ons Wound #2 (Foot) Wound Laterality: Dorsal, Right Cleanser Soap and Water Discharge Instruction: May shower and wash wound with dial antibacterial soap and water prior to dressing change. Wound Cleanser Discharge Instruction: Cleanse the wound with wound cleanser prior to applying a clean dressing using gauze sponges, not tissue or cotton balls. Peri-Wound Care Topical Primary Dressing Santyl Ointment Discharge Instruction: Apply nickel thick amount to wound bed as instructed Secondary Dressing Woven Gauze Sponge, Non-Sterile 4x4 in Discharge Instruction: Apply over primary dressing as directed. ABD Pad, 8x10 Discharge Instruction: Apply over primary dressing as directed. Secured With Elastic Bandage 4 inch (ACE bandage) Discharge Instruction: Secure with ACE bandage as directed. Kerlix Roll Sterile, 4.5x3.1 (in/yd) Discharge Instruction: Secure with Kerlix as directed. Transpore Surgical Tape, 2x10 (in/yd) Discharge Instruction: Secure dressing with tape as directed. Compression Wrap Compression Stockings Add-Ons Wound #3 (Ankle) Wound Laterality: Left, Medial Cleanser Soap and Water Discharge Instruction: May shower and wash wound with dial antibacterial  soap and water prior to dressing change. Wound Cleanser Discharge  Instruction: Cleanse the wound with wound cleanser prior to applying a clean dressing using gauze sponges, not tissue or cotton balls. Peri-Wound Care Topical Primary Dressing Santyl Ointment Discharge Instruction: Apply nickel thick amount to wound bed as instructed Secondary Dressing Woven Gauze Sponge, Non-Sterile 4x4 in Discharge Instruction: Apply over primary dressing as directed. ABD Pad, 8x10 Discharge Instruction: Apply over primary dressing as directed. Secured With Elastic Bandage 4 inch (ACE bandage) Discharge Instruction: Secure with ACE bandage as directed. Kerlix Roll Sterile, 4.5x3.1 (in/yd) Discharge Instruction: Secure with Kerlix as directed. Transpore Surgical Tape, 2x10 (in/yd) Discharge Instruction: Secure dressing with tape as directed. Compression Wrap Compression Stockings Add-Ons Wound #4 (Ankle) Wound Laterality: Left, Lateral Cleanser Soap and Water Discharge Instruction: May shower and wash wound with dial antibacterial soap and water prior to dressing change. Wound Cleanser Discharge Instruction: Cleanse the wound with wound cleanser prior to applying a clean dressing using gauze sponges, not tissue or cotton balls. Peri-Wound Care Topical Primary Dressing Santyl Ointment Discharge Instruction: Apply nickel thick amount to wound bed as instructed Secondary Dressing Woven Gauze Sponge, Non-Sterile 4x4 in Discharge Instruction: Apply over primary dressing as directed. ABD Pad, 8x10 Discharge Instruction: Apply over primary dressing as directed. Secured With Elastic Bandage 4 inch (ACE bandage) Discharge Instruction: Secure with ACE bandage as directed. Kerlix Roll Sterile, 4.5x3.1 (in/yd) Discharge Instruction: Secure with Kerlix as directed. Transpore Surgical Tape, 2x10 (in/yd) Discharge Instruction: Secure dressing with tape as directed. Compression Wrap Compression Stockings Add-Ons Wound #5 (Foot) Wound Laterality: Dorsal,  Left Cleanser Soap and Water Discharge Instruction: May shower and wash wound with dial antibacterial soap and water prior to dressing change. Wound Cleanser Discharge Instruction: Cleanse the wound with wound cleanser prior to applying a clean dressing using gauze sponges, not tissue or cotton balls. Peri-Wound Care Topical Primary Dressing Santyl Ointment Discharge Instruction: Apply nickel thick amount to wound bed as instructed Secondary Dressing Woven Gauze Sponge, Non-Sterile 4x4 in Discharge Instruction: Apply over primary dressing as directed. ABD Pad, 8x10 Discharge Instruction: Apply over primary dressing as directed. Secured With Elastic Bandage 4 inch (ACE bandage) Discharge Instruction: Secure with ACE bandage as directed. Kerlix Roll Sterile, 4.5x3.1 (in/yd) Discharge Instruction: Secure with Kerlix as directed. Transpore Surgical Tape, 2x10 (in/yd) Discharge Instruction: Secure dressing with tape as directed. Compression Wrap Compression Stockings Add-Ons Wound #6 (Foot) Wound Laterality: Left, Medial, Distal Cleanser Soap and Water Discharge Instruction: May shower and wash wound with dial antibacterial soap and water prior to dressing change. Wound Cleanser Discharge Instruction: Cleanse the wound with wound cleanser prior to applying a clean dressing using gauze sponges, not tissue or cotton balls. Peri-Wound Care Topical Primary Dressing Santyl Ointment Discharge Instruction: Apply nickel thick amount to wound bed as instructed Secondary Dressing Woven Gauze Sponge, Non-Sterile 4x4 in Discharge Instruction: Apply over primary dressing as directed. ABD Pad, 8x10 Discharge Instruction: Apply over primary dressing as directed. Secured With Elastic Bandage 4 inch (ACE bandage) Discharge Instruction: Secure with ACE bandage as directed. Kerlix Roll Sterile, 4.5x3.1 (in/yd) Discharge Instruction: Secure with Kerlix as directed. Transpore Surgical Tape, 2x10  (in/yd) Discharge Instruction: Secure dressing with tape as directed. Compression Wrap Compression Stockings Add-Ons Wound #7 (Ankle) Wound Laterality: Right, Lateral Cleanser Soap and Water Discharge Instruction: May shower and wash wound with dial antibacterial soap and water prior to dressing change. Wound Cleanser Discharge Instruction: Cleanse the wound with wound cleanser prior to applying a clean dressing using  gauze sponges, not tissue or cotton balls. Peri-Wound Care Topical Primary Dressing Santyl Ointment Discharge Instruction: Apply nickel thick amount to wound bed as instructed Secondary Dressing Woven Gauze Sponge, Non-Sterile 4x4 in Discharge Instruction: Apply over primary dressing as directed. ABD Pad, 8x10 Discharge Instruction: Apply over primary dressing as directed. Secured With Elastic Bandage 4 inch (ACE bandage) Discharge Instruction: Secure with ACE bandage as directed. Kerlix Roll Sterile, 4.5x3.1 (in/yd) Discharge Instruction: Secure with Kerlix as directed. Transpore Surgical Tape, 2x10 (in/yd) Discharge Instruction: Secure dressing with tape as directed. Compression Wrap Compression Stockings Add-Ons Electronic Signature(s) Signed: 04/08/2021 1:23:11 PM By: Kalman Shan DO Signed: 04/13/2021 5:02:53 PM By: [name] RN Entered By: Kalman Shan on 04/08/2021 11:46:36 -------------------------------------------------------------------------------- Multi-Disciplinary Care Plan Details Patient Name: Date of Service: Candace Robinson, Candace Robinson. 04/08/2021 10:45 A M Medical Record Number: [id number] Patient Account Number: [id number] Date of Birth/Sex: Treating RN: Nov 30, 1965 (55 y.o. Candace Robinson, Candace Robinson Primary Care Ensley Blas: [name] Other Clinician: Referring Gaelan Glennon: Treating Caelen Reierson/Extender: [name] Weeks in Treatment: 6 Active Inactive Wound/Skin Impairment Nursing Diagnoses: Impaired  tissue integrity Goals: Patient/caregiver will verbalize understanding of skin care regimen Date Initiated: 02/22/2021 Target Resolution Date: 04/29/2021 Goal Status: Active Ulcer/skin breakdown will have a volume reduction of 30% by week 4 Date Initiated: 02/22/2021 Target Resolution Date: 04/29/2021 Goal Status: Active Interventions: Assess patient/caregiver ability to obtain necessary supplies Assess patient/caregiver ability to perform ulcer/skin care regimen upon admission and as needed Assess ulceration(s) every visit Provide education on ulcer and skin care Treatment Activities: Topical wound management initiated : 02/22/2021 Notes: 03/25/21: All wounds not yet at 30%, culture positive. Using gent cream. Electronic Signature(s) Signed: 04/13/2021 5:02:53 PM By: [name] RN Entered By: [name] on 04/08/2021 11:28:46 -------------------------------------------------------------------------------- Pain Assessment Details Patient Name: Date of Service: Candace Robinson, Candace Robinson. 04/08/2021 10:45 A M Medical Record Number: [id number] Patient Account Number: [id number] Date of Birth/Sex: Treating RN: 1966/02/27 (55 y.o. Candace Robinson, Candace Robinson Primary Care Jazen Spraggins: [name] Other Clinician: Referring Vernor Monnig: Treating Mckinzey Entwistle/Extender: [name] Weeks in Treatment: 6 Active Problems Location of Pain Severity and Description of Pain Patient Has Paino No Site Locations Pain Management and Medication Current Pain Management: Electronic Signature(s) Signed: 04/13/2021 5:02:53 PM By: [name] RN Entered By: [name] on 04/08/2021 11:08:50 -------------------------------------------------------------------------------- Patient/Caregiver Education Details Patient Name: Date of Service: Candace A [name], Candace Robinson. 10/6/2022andnbsp10:45 A M Medical Record Number: [id number] Patient Account Number: [id number] Date of  Birth/Gender: Treating RN: 1965/10/09 (55 y.o. Candace Robinson, Candace Robinson Primary Care Physician: [name] Other Clinician: Referring Physician: Treating Physician/Extender: Darcey Nora in Treatment: 6 Education Assessment Education Provided To: Patient Education Topics Provided Wound/Skin Impairment: Methods: Explain/Verbal Responses: Reinforcements needed, State content correctly Electronic Signature(s) Signed: 04/13/2021 5:02:53 PM By: [name] RN Entered By: [name] on 04/08/2021 11:29:05 -------------------------------------------------------------------------------- Wound Assessment Details Patient Name: Date of Service: Candace Robinson, Candace Robinson. 04/08/2021 10:45 A M Medical Record Number: [id number] Patient Account Number: [id number] Date of Birth/Sex: Treating RN: 1965-09-16 (55 y.o. Candace Robinson Primary Care Pavneet Markwood: [name] Other Clinician: Referring Harriet Sutphen: Treating Jerah Esty/Extender: [name] Weeks in Treatment: 6 Wound Status Wound Number: 1 Primary Diabetic Wound/Ulcer of the Lower Extremity Etiology: Wound Location: Right, Medial Ankle Wound Open Wounding Event: Gradually Appeared Status: Date Acquired: 04/03/2020 Comorbid Anemia, Arrhythmia, Congestive Heart Failure, Hypertension, Weeks Of Treatment: 6 History: Type II Diabetes,  Lupus Erythematosus Clustered Wound: No Wound Measurements Length: (cm) 5.5 Width: (cm) 4.9 Depth: (cm) 0.3 Area: (cm) 21.166 Volume: (cm) 6.35 % Reduction in Area: -28.3% % Reduction in Volume: -92.5% Epithelialization: None Tunneling: No Undermining: No Wound Description Classification: Grade 2 Wound Margin: Distinct, outline attached Exudate Amount: Medium Exudate Type: Serosanguineous Exudate Color: red, brown Foul Odor After Cleansing: No Slough/Fibrino Yes Wound Bed Granulation Amount: Medium (34-66%) Exposed  Structure Granulation Quality: Red Fascia Exposed: No Necrotic Amount: Medium (34-66%) Fat Layer (Subcutaneous Tissue) Exposed: Yes Necrotic Quality: Adherent Slough Tendon Exposed: No Muscle Exposed: No Joint Exposed: No Bone Exposed: No Treatment Notes Wound #1 (Ankle) Wound Laterality: Right, Medial Cleanser Soap and Water Discharge Instruction: May shower and wash wound with dial antibacterial soap and water prior to dressing change. Wound Cleanser Discharge Instruction: Cleanse the wound with wound cleanser prior to applying a clean dressing using gauze sponges, not tissue or cotton balls. Peri-Wound Care Topical Primary Dressing Santyl Ointment Discharge Instruction: Apply nickel thick amount to wound bed as instructed Secondary Dressing Woven Gauze Sponge, Non-Sterile 4x4 in Discharge Instruction: Apply over primary dressing as directed. ABD Pad, 8x10 Discharge Instruction: Apply over primary dressing as directed. Secured With Elastic Bandage 4 inch (ACE bandage) Discharge Instruction: Secure with ACE bandage as directed. Kerlix Roll Sterile, 4.5x3.1 (in/yd) Discharge Instruction: Secure with Kerlix as directed. Transpore Surgical Tape, 2x10 (in/yd) Discharge Instruction: Secure dressing with tape as directed. Compression Wrap Compression Stockings Add-Ons Electronic Signature(s) Signed: 04/08/2021 11:29:27 AM By: Lorrin Jackson Entered By: Lorrin Jackson on 04/08/2021 11:29:26 -------------------------------------------------------------------------------- Wound Assessment Details Patient Name: Date of Service: Candace Robinson, Candace Robinson. 04/08/2021 10:45 A M Medical Record Number: [id number] Patient Account Number: [id number] Date of Birth/Sex: Treating RN: 12-17-1965 (55 y.o. Candace Robinson Primary Care Neosha Switalski: [name] Other Clinician: Referring Dublin Cantero: Treating Messiah Rovira/Extender: [name] Weeks in Treatment: 6 Wound  Status Wound Number: 2 Primary Diabetic Wound/Ulcer of the Lower Extremity Etiology: Wound Location: Right, Dorsal Foot Wound Open Wounding Event: Gradually Appeared Status: Date Acquired: 04/03/2020 Comorbid Anemia, Arrhythmia, Congestive Heart Failure, Hypertension, Weeks Of Treatment: 6 History: Type II Diabetes, Lupus Erythematosus Clustered Wound: No Wound Measurements Length: (cm) 5 Width: (cm) 5.5 Depth: (cm) 0.2 Area: (cm) 21.598 Volume: (cm) 4.32 % Reduction in Area: -83.3% % Reduction in Volume: -266.7% Epithelialization: None Tunneling: No Undermining: No Wound Description Classification: Grade 2 Wound Margin: Distinct, outline attached Exudate Amount: Medium Exudate Type: Serosanguineous Exudate Color: red, brown Foul Odor After Cleansing: No Slough/Fibrino Yes Wound Bed Granulation Amount: Medium (34-66%) Exposed Structure Granulation Quality: Red Fascia Exposed: No Necrotic Amount: Medium (34-66%) Fat Layer (Subcutaneous Tissue) Exposed: Yes Necrotic Quality: Adherent Slough Tendon Exposed: No Muscle Exposed: No Joint Exposed: No Bone Exposed: No Treatment Notes Wound #2 (Foot) Wound Laterality: Dorsal, Right Cleanser Soap and Water Discharge Instruction: May shower and wash wound with dial antibacterial soap and water prior to dressing change. Wound Cleanser Discharge Instruction: Cleanse the wound with wound cleanser prior to applying a clean dressing using gauze sponges, not tissue or cotton balls. Peri-Wound Care Topical Primary Dressing Santyl Ointment Discharge Instruction: Apply nickel thick amount to wound bed as instructed Secondary Dressing Woven Gauze Sponge, Non-Sterile 4x4 in Discharge Instruction: Apply over primary dressing as directed. ABD Pad, 8x10 Discharge Instruction: Apply over primary dressing as directed. Secured With Elastic Bandage 4 inch (ACE bandage) Discharge Instruction: Secure with ACE bandage as  directed. Kerlix Roll Sterile, 4.5x3.1 (in/yd) Discharge Instruction: Secure with  Kerlix as directed. Transpore Surgical Tape, 2x10 (in/yd) Discharge Instruction: Secure dressing with tape as directed. Compression Wrap Compression Stockings Add-Ons Electronic Signature(s) Signed: 04/08/2021 11:28:52 AM By: Lorrin Jackson Entered By: Lorrin Jackson on 04/08/2021 11:28:51 -------------------------------------------------------------------------------- Wound Assessment Details Patient Name: Date of Service: Candace Robinson, Candace Robinson. 04/08/2021 10:45 A M Medical Record Number: [id number] Patient Account Number: [id number] Date of Birth/Sex: Treating RN: 1965/07/24 (55 y.o. Candace Robinson Primary Care Qusay Villada: [name] Other Clinician: Referring Marian Grandt: Treating Nadeem Romanoski/Extender: [name] Weeks in Treatment: 6 Wound Status Wound Number: 3 Primary Diabetic Wound/Ulcer of the Lower Extremity Etiology: Wound Location: Left, Medial Ankle Wound Open Wounding Event: Gradually Appeared Status: Date Acquired: 02/22/2021 Comorbid Anemia, Arrhythmia, Congestive Heart Failure, Hypertension, Weeks Of Treatment: 6 History: Type II Diabetes, Lupus Erythematosus Clustered Wound: No Wound Measurements Length: (cm) 1 Width: (cm) 2 Depth: (cm) 0.2 Area: (cm) 1.571 Volume: (cm) 0.314 % Reduction in Area: 20% % Reduction in Volume: 20.1% Epithelialization: Medium (34-66%) Tunneling: No Undermining: No Wound Description Classification: Grade 2 Wound Margin: Distinct, outline attached Exudate Amount: Medium Exudate Type: Serosanguineous Exudate Color: red, brown Foul Odor After Cleansing: No Slough/Fibrino Yes Wound Bed Granulation Amount: Large (67-100%) Exposed Structure Granulation Quality: Red Fascia Exposed: No Necrotic Amount: Small (1-33%) Fat Layer (Subcutaneous Tissue) Exposed: Yes Necrotic Quality: Adherent Slough Tendon Exposed:  No Muscle Exposed: No Joint Exposed: No Bone Exposed: No Treatment Notes Wound #3 (Ankle) Wound Laterality: Left, Medial Cleanser Soap and Water Discharge Instruction: May shower and wash wound with dial antibacterial soap and water prior to dressing change. Wound Cleanser Discharge Instruction: Cleanse the wound with wound cleanser prior to applying a clean dressing using gauze sponges, not tissue or cotton balls. Peri-Wound Care Topical Primary Dressing Santyl Ointment Discharge Instruction: Apply nickel thick amount to wound bed as instructed Secondary Dressing Woven Gauze Sponge, Non-Sterile 4x4 in Discharge Instruction: Apply over primary dressing as directed. ABD Pad, 8x10 Discharge Instruction: Apply over primary dressing as directed. Secured With Elastic Bandage 4 inch (ACE bandage) Discharge Instruction: Secure with ACE bandage as directed. Kerlix Roll Sterile, 4.5x3.1 (in/yd) Discharge Instruction: Secure with Kerlix as directed. Transpore Surgical Tape, 2x10 (in/yd) Discharge Instruction: Secure dressing with tape as directed. Compression Wrap Compression Stockings Add-Ons Electronic Signature(s) Signed: 04/08/2021 11:28:24 AM By: Lorrin Jackson Entered By: Lorrin Jackson on 04/08/2021 11:28:23 -------------------------------------------------------------------------------- Wound Assessment Details Patient Name: Date of Service: Candace Robinson, Candace Robinson. 04/08/2021 10:45 A M Medical Record Number: [id number] Patient Account Number: [id number] Date of Birth/Sex: Treating RN: 09-Jul-1965 (55 y.o. Candace Robinson Primary Care Gaylord Seydel: [name] Other Clinician: Referring Imani Sherrin: Treating Dixie Jafri/Extender: [name] Weeks in Treatment: 6 Wound Status Wound Number: 4 Primary Diabetic Wound/Ulcer of the Lower Extremity Etiology: Wound Location: Left, Lateral Ankle Wound Open Wounding Event: Gradually Appeared Status: Date  Acquired: 04/03/2020 Comorbid Anemia, Arrhythmia, Congestive Heart Failure, Hypertension, Weeks Of Treatment: 6 History: Type II Diabetes, Lupus Erythematosus Clustered Wound: No Wound Measurements Length: (cm) 7.5 Width: (cm) 4.5 Depth: (cm) 0.2 Area: (cm) 26.507 Volume: (cm) 5.301 % Reduction in Area: 29.7% % Reduction in Volume: 29.7% Epithelialization: Small (1-33%) Tunneling: No Undermining: No Wound Description Classification: Grade 2 Wound Margin: Distinct, outline attached Exudate Amount: Medium Exudate Type: Serosanguineous Exudate Color: red, brown Foul Odor After Cleansing: No Slough/Fibrino Yes Wound Bed Granulation Amount: Medium (34-66%) Exposed Structure Granulation Quality: Red Fascia Exposed: No Necrotic Amount: Medium (34-66%) Fat Layer (Subcutaneous Tissue) Exposed: Yes  Necrotic Quality: Adherent Slough Tendon Exposed: No Muscle Exposed: No Joint Exposed: No Bone Exposed: No Treatment Notes Wound #4 (Ankle) Wound Laterality: Left, Lateral Cleanser Soap and Water Discharge Instruction: May shower and wash wound with dial antibacterial soap and water prior to dressing change. Wound Cleanser Discharge Instruction: Cleanse the wound with wound cleanser prior to applying a clean dressing using gauze sponges, not tissue or cotton balls. Peri-Wound Care Topical Primary Dressing Santyl Ointment Discharge Instruction: Apply nickel thick amount to wound bed as instructed Secondary Dressing Woven Gauze Sponge, Non-Sterile 4x4 in Discharge Instruction: Apply over primary dressing as directed. ABD Pad, 8x10 Discharge Instruction: Apply over primary dressing as directed. Secured With Elastic Bandage 4 inch (ACE bandage) Discharge Instruction: Secure with ACE bandage as directed. Kerlix Roll Sterile, 4.5x3.1 (in/yd) Discharge Instruction: Secure with Kerlix as directed. Transpore Surgical Tape, 2x10 (in/yd) Discharge Instruction: Secure dressing with  tape as directed. Compression Wrap Compression Stockings Add-Ons Electronic Signature(s) Signed: 04/08/2021 11:27:35 AM By: Lorrin Jackson Entered By: Lorrin Jackson on 04/08/2021 11:27:34 -------------------------------------------------------------------------------- Wound Assessment Details Patient Name: Date of Service: Candace Robinson, Candace Robinson. 04/08/2021 10:45 A M Medical Record Number: [id number] Patient Account Number: [id number] Date of Birth/Sex: Treating RN: 1966-06-16 (55 y.o. Candace Robinson Primary Care Gaile Allmon: [name] Other Clinician: Referring Rasheed Welty: Treating Dulcy Sida/Extender: [name] Weeks in Treatment: 6 Wound Status Wound Number: 5 Primary Diabetic Wound/Ulcer of the Lower Extremity Etiology: Wound Location: Left, Dorsal Foot Wound Open Wounding Event: Gradually Appeared Status: Date Acquired: 04/03/2020 Comorbid Anemia, Arrhythmia, Congestive Heart Failure, Hypertension, Weeks Of Treatment: 6 History: Type II Diabetes, Lupus Erythematosus Clustered Wound: No Wound Measurements Length: (cm) 10 Width: (cm) 9 Depth: (cm) 0.2 Area: (cm) 70.686 Volume: (cm) 14.137 % Reduction in Area: -17.6% % Reduction in Volume: -135.3% Epithelialization: None Tunneling: No Undermining: No Wound Description Classification: Grade 2 Wound Margin: Distinct, outline attached Exudate Amount: Medium Exudate Type: Serosanguineous Exudate Color: red, brown Foul Odor After Cleansing: No Slough/Fibrino Yes Wound Bed Granulation Amount: Medium (34-66%) Exposed Structure Granulation Quality: Red Fascia Exposed: No Necrotic Amount: Medium (34-66%) Fat Layer (Subcutaneous Tissue) Exposed: Yes Necrotic Quality: Adherent Slough Tendon Exposed: No Muscle Exposed: No Joint Exposed: No Bone Exposed: No Treatment Notes Wound #5 (Foot) Wound Laterality: Dorsal, Left Cleanser Soap and Water Discharge Instruction: May shower and  wash wound with dial antibacterial soap and water prior to dressing change. Wound Cleanser Discharge Instruction: Cleanse the wound with wound cleanser prior to applying a clean dressing using gauze sponges, not tissue or cotton balls. Peri-Wound Care Topical Primary Dressing Santyl Ointment Discharge Instruction: Apply nickel thick amount to wound bed as instructed Secondary Dressing Woven Gauze Sponge, Non-Sterile 4x4 in Discharge Instruction: Apply over primary dressing as directed. ABD Pad, 8x10 Discharge Instruction: Apply over primary dressing as directed. Secured With Elastic Bandage 4 inch (ACE bandage) Discharge Instruction: Secure with ACE bandage as directed. Kerlix Roll Sterile, 4.5x3.1 (in/yd) Discharge Instruction: Secure with Kerlix as directed. Transpore Surgical Tape, 2x10 (in/yd) Discharge Instruction: Secure dressing with tape as directed. Compression Wrap Compression Stockings Add-Ons Electronic Signature(s) Signed: 04/08/2021 11:26:46 AM By: Lorrin Jackson Entered By: Lorrin Jackson on 04/08/2021 11:26:45 -------------------------------------------------------------------------------- Wound Assessment Details Patient Name: Date of Service: Candace Robinson, Candace Robinson. 04/08/2021 10:45 A M Medical Record Number: [id number] Patient Account Number: [id number] Date of Birth/Sex: Treating RN: 10/18/1965 (55 y.o. Candace Robinson Primary Care Ayn Domangue: [name] Other Clinician: Referring Lamoyne Palencia: Treating Danyal Whitenack/Extender: Kalman Shan  [name] Weeks in Treatment: 6 Wound Status Wound Number: 6 Primary Diabetic Wound/Ulcer of the Lower Extremity Etiology: Wound Location: Left, Distal, Medial Foot Wound Open Wounding Event: Gradually Appeared Status: Date Acquired: 04/03/2020 Comorbid Anemia, Arrhythmia, Congestive Heart Failure, Hypertension, Weeks Of Treatment: 6 History: Type II Diabetes, Lupus Erythematosus Clustered Wound:  No Wound Measurements Length: (cm) 0.5 Width: (cm) 0.5 Depth: (cm) 0.1 Area: (cm) 0.196 Volume: (cm) 0.02 % Reduction in Area: 37.6% % Reduction in Volume: 35.5% Epithelialization: Medium (34-66%) Tunneling: No Undermining: No Wound Description Classification: Grade 2 Wound Margin: Distinct, outline attached Exudate Amount: Medium Exudate Type: Serosanguineous Exudate Color: red, brown Foul Odor After Cleansing: No Slough/Fibrino Yes Wound Bed Granulation Amount: Medium (34-66%) Exposed Structure Granulation Quality: Red, Pink Fascia Exposed: No Necrotic Amount: Small (1-33%) Fat Layer (Subcutaneous Tissue) Exposed: Yes Necrotic Quality: Adherent Slough Tendon Exposed: No Muscle Exposed: No Joint Exposed: No Bone Exposed: No Treatment Notes Wound #6 (Foot) Wound Laterality: Left, Medial, Distal Cleanser Soap and Water Discharge Instruction: May shower and wash wound with dial antibacterial soap and water prior to dressing change. Wound Cleanser Discharge Instruction: Cleanse the wound with wound cleanser prior to applying a clean dressing using gauze sponges, not tissue or cotton balls. Peri-Wound Care Topical Primary Dressing Santyl Ointment Discharge Instruction: Apply nickel thick amount to wound bed as instructed Secondary Dressing Woven Gauze Sponge, Non-Sterile 4x4 in Discharge Instruction: Apply over primary dressing as directed. ABD Pad, 8x10 Discharge Instruction: Apply over primary dressing as directed. Secured With Elastic Bandage 4 inch (ACE bandage) Discharge Instruction: Secure with ACE bandage as directed. Kerlix Roll Sterile, 4.5x3.1 (in/yd) Discharge Instruction: Secure with Kerlix as directed. Transpore Surgical Tape, 2x10 (in/yd) Discharge Instruction: Secure dressing with tape as directed. Compression Wrap Compression Stockings Add-Ons Electronic Signature(s) Signed: 04/08/2021 11:26:06 AM By: Lorrin Jackson Entered By: Lorrin Jackson  on 04/08/2021 11:26:06 -------------------------------------------------------------------------------- Wound Assessment Details Patient Name: Date of Service: Candace Robinson, Candace Robinson. 04/08/2021 10:45 A M Medical Record Number: [id number] Patient Account Number: [id number] Date of Birth/Sex: Treating RN: Nov 25, 1965 (55 y.o. Candace Robinson, Candace Robinson Primary Care Lorenda Grecco: [name] Other Clinician: Referring Legrande Hao: Treating Minnie Shi/Extender: [name] Weeks in Treatment: 6 Wound Status Wound Number: 7 Primary Lupus Etiology: Wound Location: Right, Lateral Ankle Wound Open Wounding Event: Gradually Appeared Status: Date Acquired: 04/08/2021 Comorbid Anemia, Arrhythmia, Congestive Heart Failure, Hypertension, Weeks Of Treatment: 0 History: Type II Diabetes, Lupus Erythematosus Clustered Wound: No Wound Measurements Length: (cm) 0.2 Width: (cm) 0.2 Depth: (cm) 0.1 Area: (cm) 0.031 Volume: (cm) 0.003 % Reduction in Area: % Reduction in Volume: Epithelialization: None Tunneling: No Undermining: No Wound Description Classification: Full Thickness Without Exposed Support Structures Wound Margin: Distinct, outline attached Exudate Amount: Medium Exudate Type: Serosanguineous Exudate Color: red, brown Foul Odor After Cleansing: No Slough/Fibrino Yes Wound Bed Granulation Amount: Medium (34-66%) Exposed Structure Granulation Quality: Red, Pink Fascia Exposed: No Necrotic Amount: Medium (34-66%) Fat Layer (Subcutaneous Tissue) Exposed: No Necrotic Quality: Adherent Slough Tendon Exposed: No Muscle Exposed: No Joint Exposed: No Bone Exposed: No Treatment Notes Wound #7 (Ankle) Wound Laterality: Right, Lateral Cleanser Soap and Water Discharge Instruction: May shower and wash wound with dial antibacterial soap and water prior to dressing change. Wound Cleanser Discharge Instruction: Cleanse the wound with wound cleanser prior to  applying a clean dressing using gauze sponges, not tissue or cotton balls. Peri-Wound Care Topical Primary Dressing Santyl Ointment Discharge Instruction: Apply nickel thick amount to wound bed as instructed Secondary  Dressing Woven Gauze Sponge, Non-Sterile 4x4 in Discharge Instruction: Apply over primary dressing as directed. ABD Pad, 8x10 Discharge Instruction: Apply over primary dressing as directed. Secured With Elastic Bandage 4 inch (ACE bandage) Discharge Instruction: Secure with ACE bandage as directed. Kerlix Roll Sterile, 4.5x3.1 (in/yd) Discharge Instruction: Secure with Kerlix as directed. Transpore Surgical Tape, 2x10 (in/yd) Discharge Instruction: Secure dressing with tape as directed. Compression Wrap Compression Stockings Add-Ons Electronic Signature(s) Signed: 04/13/2021 5:02:53 PM By: [name] RN Entered By: [name] on 04/08/2021 11:22:20 -------------------------------------------------------------------------------- Vitals Details Patient Name: Date of Service: Candace A [name], Candace Robinson. 04/08/2021 10:45 A M Medical Record Number: [id number] Patient Account Number: [id number] Date of Birth/Sex: Treating RN: [birth year] (55 y.o. Candace Robinson, Candace Robinson Primary Care Saharah Sherrow: [name] Other Clinician: Referring Saraiya Kozma: Treating Tristen Pennino/Extender: [name] Weeks in Treatment: 6 Vital Signs Time Taken: 11:08 Temperature (F): 98.8 Height (in): 63 Pulse (bpm): 74 Weight (lbs): 157 Respiratory Rate (breaths/min): 17 Body Mass Index (BMI): 27.8 Blood Pressure (mmHg): 145/85 Reference Range: 80 - 120 mg / dl Electronic Signature(s) Signed: 04/13/2021 5:02:53 PM By: [name] RN Entered By: [name] on 04/08/2021 11:08:44

## 2021-04-14 LAB — T4, FREE: Free T4: 0.22 ng/dL — ABNORMAL LOW (ref 0.82–1.77)

## 2021-04-14 LAB — TSH: TSH: 98.6 u[IU]/mL — ABNORMAL HIGH (ref 0.450–4.500)

## 2021-04-15 ENCOUNTER — Encounter (HOSPITAL_BASED_OUTPATIENT_CLINIC_OR_DEPARTMENT_OTHER): Payer: Medicaid Other | Admitting: Internal Medicine

## 2021-04-16 NOTE — Patient Instructions (Signed)

## 2021-04-19 ENCOUNTER — Encounter: Payer: Self-pay | Admitting: Nurse Practitioner

## 2021-04-19 ENCOUNTER — Encounter (HOSPITAL_BASED_OUTPATIENT_CLINIC_OR_DEPARTMENT_OTHER): Payer: Medicaid Other | Admitting: Internal Medicine

## 2021-04-19 ENCOUNTER — Telehealth (INDEPENDENT_AMBULATORY_CARE_PROVIDER_SITE_OTHER): Payer: Medicaid Other | Admitting: Nurse Practitioner

## 2021-04-19 ENCOUNTER — Other Ambulatory Visit: Payer: Self-pay

## 2021-04-19 VITALS — BP 128/72 | HR 68 | Ht 63.0 in | Wt 157.0 lb

## 2021-04-19 DIAGNOSIS — E890 Postprocedural hypothyroidism: Secondary | ICD-10-CM

## 2021-04-19 DIAGNOSIS — E11621 Type 2 diabetes mellitus with foot ulcer: Secondary | ICD-10-CM | POA: Diagnosis not present

## 2021-04-19 MED ORDER — LEVOTHYROXINE SODIUM 50 MCG PO TABS: 50.0000 ug | ORAL_TABLET | Freq: Every day | ORAL | 1 refills | Status: DC

## 2021-04-19 NOTE — Progress Notes (Signed)
04/19/2021     Endocrinology Follow Up Note     TELEHEALTH VISIT: The patient is being engaged in telehealth visit due to COVID-19.  This type of visit limits physical examination significantly, and thus is not preferable over face-to-face encounters.  I connected with  Kmari Brian Hegler on 04/19/21 by a video enabled telemedicine application and verified that I am speaking with the correct person using two identifiers.   I discussed the limitations of evaluation and management by telemedicine. The patient expressed understanding and agreed to proceed.    The participants involved in this visit include: Brita Romp, NP located at [facility] and Wonda Goodgame Hubner  located at their personal residence listed.   Subjective:    Patient ID: Candace Robinson, female    DOB: 27-Nov-1965, PCP [name], MD.   Past Medical History:  Diagnosis Date   Anemia    Atrial fibrillation ([place])    CHF (congestive heart failure) ([place])    Diabetes mellitus without complication ([place])    Heart murmur    Hypertension    Lupus ([place])    Thyroid disease    hyperthyroid    History reviewed. No pertinent surgical history.  Social History   Socioeconomic History   Marital status: Single    Spouse name: Not on file   Number of children: Not on file   Years of education: Not on file   Highest education level: Not on file  Occupational History   Not on file  Tobacco Use   Smoking status: Never   Smokeless tobacco: Never  Vaping Use   Vaping Use: Never used  Substance and Sexual Activity   Alcohol use: Not Currently   Drug use: Not Currently   Sexual activity: Not on file  Other Topics Concern   Not on file  Social History Narrative   Not on file   Social Determinants of Health   Financial Resource Strain: Not on file  Food Insecurity: Not on file  Transportation Needs: Not on file  Physical Activity: Not on file  Stress: Not on  file  Social Connections: Not on file    Family History  Problem Relation Age of Onset   Hypertension Mother     Outpatient Encounter Medications as of 04/19/2021  Medication Sig   acetaminophen (TYLENOL) 325 MG tablet Take 2 tablets (650 mg total) by mouth every 6 (six) hours.   amLODipine (NORVASC) 10 MG tablet Take 10 mg by mouth daily.   ASPIRIN LOW DOSE 81 MG EC tablet Take 81 mg by mouth daily.   atorvastatin (LIPITOR) 20 MG tablet Take 20 mg by mouth daily.   ferrous sulfate 325 (65 FE) MG tablet Take 1 tablet (325 mg total) by mouth in the morning and at bedtime.   hydrOXYzine (ATARAX/VISTARIL) 25 MG tablet Take 25 mg by mouth every 8 (eight) hours as needed.   HYSEPT 0.25 % SOLN Apply topically 2 (two) times daily.   levothyroxine (SYNTHROID) 50 MCG tablet Take 1 tablet (50 mcg total) by mouth daily.   lisinopril (ZESTRIL) 10 MG tablet Take 10 mg by mouth daily.   Multiple Vitamins-Minerals (SENTRY SENIOR/LUTEIN PO) Take 1 tablet by mouth daily in the afternoon.   NON FORMULARY Diet Regular   omeprazole (PRILOSEC) 20 MG capsule Take 20 mg by mouth 2 (two) times daily.   omeprazole (PRILOSEC) 40 MG capsule Take 1 capsule (40 mg total) by mouth daily.   ondansetron (ZOFRAN) 4  MG tablet Take 4 mg by mouth as needed.   SANTYL ointment Apply 1 application topically daily.   silver sulfADIAZINE (SILVADENE) 1 % cream Apply topically daily.   tacrolimus (PROTOPIC) 0.1 % ointment Apply topically as needed.   traMADol HCl 100 MG TABS Take 1 tablet by mouth as needed.   zinc gluconate 50 MG tablet Take 50 mg by mouth daily.   [DISCONTINUED] propranolol (INDERAL) 20 MG tablet Take 1 tablet (20 mg total) by mouth 2 (two) times daily. (Patient not taking: No sig reported)   [DISCONTINUED] Vitamin D, Ergocalciferol, (DRISDOL) 1.25 MG (50000 UNIT) CAPS capsule Take 1 capsule (50,000 Units total) by mouth every 7 (seven) days. On Sunday (Patient not taking: No sig reported)   No  facility-administered encounter medications on file as of 04/19/2021.    ALLERGIES: Allergies  Allergen Reactions   Motrin [Ibuprofen] Other (See Comments)    hallucinations    VACCINATION STATUS: Immunization History  Administered Date(s) Administered   PFIZER(Purple Top)SARS-COV-2 Vaccination 09/12/2019, 06/30/2020     HPI  Candace Robinson is 55 y.o. female who presents today with a medical history as above. she is being seen in follow up after being seen in consultation for hyperthyroidism requested by [name], MD.  she was recently hospitalized for complications with nonhealing wounds on her bilateral feet.  She developed altered mental status and thus thyroid labs were performed showing severely suppressed TSH and high Free T3 levels, in impending thyroid storm.  She was treated with high dose of Methimazole 30 mg po daily and sent to the [facility] for rehabilitation where she has since been sent home.  She also had baseline thyroid ultrasound which did not show any suspicious nodules.  she currently denies dysphagia, choking, shortness of breath, no recent voice change.    she denies family history of thyroid dysfunction and denies family hx of thyroid cancer. she denies personal history of goiter. she is on any anti-thyroid medications and propanolol for symptom management. Denies use of Biotin containing supplements.    Once her thyroid labs stabilized, we were able to taper and stop her Methimazole and proceed with NM uptake and scan which confirmed Graves disease.  She is s/p RAI therapy on 12/11/20.   Review of systems  Constitutional: + Minimally fluctuating body weight,  current Body mass index is 27.81 kg/m. , no fatigue, no subjective hyperthermia, no subjective hypothermia Eyes: no blurry vision, no xerophthalmia ENT: no sore throat, no nodules palpated in throat, no dysphagia/odynophagia, no hoarseness Cardiovascular: no chest pain, no shortness of  breath, no palpitations, + leg swelling Respiratory: no cough, no shortness of breath Gastrointestinal: no nausea/vomiting/diarrhea Musculoskeletal: no muscle/joint aches Skin: no rashes, no hyperemia, still has wounds to bilateral feet- improving slowly Neurological: no tremors, no numbness, no tingling, no dizziness Psychiatric: no depression, no anxiety   Objective:    BP 128/72   Pulse 68   Ht 5\' 3"  (1.6 m)   Wt 157 lb (71.2 kg)   BMI 27.81 kg/m   Wt Readings from Last 3 Encounters:  04/19/21 157 lb (71.2 kg)  03/10/21 165 lb (74.8 kg)  02/16/21 157 lb (71.2 kg)     BP Readings from Last 3 Encounters:  04/19/21 128/72  03/10/21 130/77  02/16/21 99/66                       Physical Exam- Telehealth- significantly limited due to nature of visit  Constitutional: Body  mass index is 27.81 kg/m. , not in acute distress, normal state of mind Respiratory: Adequate breathing efforts   CMP     Component Value Date/Time   NA 133 (L) 09/17/2020 0247   K 4.2 09/17/2020 0247   CL 108 09/17/2020 0247   CO2 20 (L) 09/17/2020 0247   GLUCOSE 92 09/17/2020 0247   BUN 7 09/17/2020 0247   CREATININE 0.45 09/17/2020 0247   CALCIUM 9.2 09/17/2020 0247   PROT 6.6 09/10/2020 0243   ALBUMIN 2.1 (L) 09/10/2020 0243   AST 52 (H) 09/10/2020 0243   ALT 33 09/10/2020 0243   ALKPHOS 146 (H) 09/10/2020 0243   BILITOT 0.8 09/10/2020 0243   GFRNONAA >60 09/17/2020 0247     CBC    Component Value Date/Time   WBC 3.2 (L) 09/21/2020 0425   RBC 3.11 (L) 09/21/2020 0425   HGB 7.7 (L) 09/21/2020 0425   HCT 25.9 (L) 09/21/2020 0425   PLT 208 09/21/2020 0425   MCV 83.3 09/21/2020 0425   MCH 24.8 (L) 09/21/2020 0425   MCHC 29.7 (L) 09/21/2020 0425   RDW 19.9 (H) 09/21/2020 0425   LYMPHSABS 1.7 09/12/2020 0329   MONOABS 0.4 09/12/2020 0329   EOSABS 0.2 09/12/2020 0329   BASOSABS 0.0 09/12/2020 0329     Diabetic Labs (most recent): Lab Results  Component Value Date   HGBA1C 5.6  09/01/2020    Lipid Panel     Component Value Date/Time   CHOL 77 09/01/2020 0342   TRIG 52 09/01/2020 0342   HDL 20 (L) 09/01/2020 0342   CHOLHDL 3.9 09/01/2020 0342   VLDL 10 09/01/2020 0342   LDLCALC 47 09/01/2020 0342     Lab Results  Component Value Date   TSH 98.600 (H) 04/13/2021   TSH 0.012 (L) 02/05/2021   TSH 0.01 (A) 11/18/2020   TSH 0.029 (L) 10/09/2020   TSH 0.018 (L) 10/08/2020   TSH <0.010 (L) 09/06/2020   TSH <0.010 (L) 09/04/2020   TSH <0.010 (L) 08/31/2020   FREET4 0.22 (L) 04/13/2021   FREET4 1.01 02/05/2021   FREET4 0.73 10/09/2020   FREET4 0.78 10/08/2020   FREET4 1.40 (H) 09/06/2020   FREET4 1.72 (H) 09/04/2020   FREET4 2.53 (H) 08/31/2020     Results for FERRAH, PANAGOPOULOS (MRN [id number]) as of 04/19/2021 11:04  Ref. Range 10/09/2020 08:13 10/09/2020 08:14 11/18/2020 00:00 02/05/2021 09:04 04/13/2021 09:17  TSH Latest Ref Range: 0.450 - 4.500 uIU/mL 0.029 (L)  0.01 (A) 0.012 (L) 98.600 (H)  Triiodothyronine,Free,Serum Latest Ref Range: 2.0 - 4.4 pg/mL  1.5 (L)     T4,Free(Direct) Latest Ref Range: 0.82 - 1.77 ng/dL 0.73   1.01 0.22 (L)  Thyroperoxidase Ab SerPl-aCnc Latest Ref Range: 0 - 34 IU/mL 161 (H)      Thyroglobulin Antibody Latest Ref Range: 0.0 - 0.9 IU/mL <1.0         Assessment & Plan:   1. 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    Her repeat thyroid function tests are consistent with treatment success, now at a range where thyroid hormone can be started.  She is advised to start Levothyroxine 50 mcg po daily before breakfast.  Will recheck TFTs in 2 months and adjust dose further if needed.   - The correct intake of thyroid hormone (Levothyroxine, Synthroid), is on empty stomach first thing in the morning, with water, separated by at least 30 minutes from breakfast  and other medications,  and separated by more than 4 hours from calcium, iron, multivitamins, acid reflux  medications (PPIs).  - This medication is a life-long medication and will be needed to correct thyroid hormone imbalances for the rest of your life.  The dose may change from time to time, based on thyroid blood work.  - It is extremely important to be consistent taking this medication, near the same time each morning.  -AVOID TAKING PRODUCTS CONTAINING BIOTIN (commonly found in Hair, Skin, Nails vitamins) AS IT INTERFERES WITH THE VALIDITY OF THYROID FUNCTION BLOOD TESTS.    -Patient is advised to maintain close follow up with [name], MD for primary care needs.     I spent 20 minutes dedicated to the care of this patient on the date of this encounter to include pre-visit review of records, face-to-face time with the patient, and post visit ordering of  testing.  Sharene Butters Stodghill  participated in the discussions, expressed understanding, and voiced agreement with the above plans.  All questions were answered to her satisfaction. she is encouraged to contact clinic should she have any questions or concerns prior to her return visit.    Follow up plan: Return in about 2 months (around 06/19/2021) for Thyroid follow up, Previsit labs, Virtual visit ok.   Rayetta Pigg, [facility] [address] [place], [place] [address] Phone: [phone] Fax: [phone]  04/19/2021, 11:05 AM

## 2021-04-19 NOTE — Progress Notes (Signed)
YUI, MULVANEY ([id number]) Visit Report for 04/19/2021 Arrival Information Details Patient Name: Date of Service: [name], [name] 04/19/2021 1:30 PM Medical Record Number: [id number] Patient Account Number: [id number] Date of Birth/Sex: Treating RN: [birth year] (55 y.o. [name], Meta.Reding Primary Care Deanda Ruddell: [name] Other Clinician: Referring Husam Hohn: Treating Chandni Gagan/Extender: Debbra Riding in Treatment: 8 Visit Information History Since Last Visit Added or deleted any medications: No Patient Arrived: Ambulatory Any new allergies or adverse reactions: No Arrival Time: 13:55 Had a fall or experienced change in No Accompanied By: alone activities of daily living that may affect Transfer Assistance: None risk of falls: Patient Identification Verified: Yes Signs or symptoms of abuse/neglect since last visito No Secondary Verification Process Completed: Yes Hospitalized since last visit: No Patient Requires Transmission-Based No Implantable device outside of the clinic excluding No Precautions: cellular tissue based products placed in the center Patient Has Alerts: Yes since last visit: Patient Alerts: R ABI: 1.0 L ABI: 1.1 Has Dressing in Place as Prescribed: Yes NO BENZOCAINE Pain Present Now: Yes USE LIDOCAINE GEL ONLY Electronic Signature(s) Signed: 04/19/2021 5:45:49 PM By: Deon Pilling RN, BSN Entered By: Deon Pilling on 04/19/2021 13:56:15 -------------------------------------------------------------------------------- Encounter Discharge Information Details Patient Name: Date of Service: [name], [name] 04/19/2021 1:30 PM Medical Record Number: [id number] Patient Account Number: [id number] Date of Birth/Sex: Treating RN: 12/05/1965 (55 y.o. [name] Primary Care Yorel Redder: [name] Other Clinician: Referring Jarmel Linhardt: Treating Kelis Plasse/Extender: Debbra Riding in  Treatment: 8 Encounter Discharge Information Items Post Procedure Vitals Discharge Condition: Stable Temperature (F): 98.3 Ambulatory Status: Walker Pulse (bpm): 71 Discharge Destination: Home Respiratory Rate (breaths/min): 18 Transportation: Other Blood Pressure (mmHg): 117/78 Accompanied By: self Schedule Follow-up Appointment: Yes Clinical Summary of Care: Patient Declined Notes transportation service Electronic Signature(s) Signed: 04/19/2021 5:48:05 PM By: Baruch Gouty RN, BSN Entered By: Baruch Gouty on 04/19/2021 15:18:06 -------------------------------------------------------------------------------- Lower Extremity Assessment Details Patient Name: Date of Service: [name], [name] 04/19/2021 1:30 PM Medical Record Number: [id number] Patient Account Number: [id number] Date of Birth/Sex: Treating RN: [birth year] (55 y.o. [name], Tammi Klippel Primary Care Carmell Elgin: [name] Other Clinician: Referring Tyanna Hach: Treating Nolah Krenzer/Extender: Debbra Riding in Treatment: 8 Edema Assessment Assessed: [name]: No] [Right: No] Edema: [Left: Yes] [Right: Yes] Calf Left: Right: Point of Measurement: 29 cm From Medial Instep 34 cm 33.5 cm Ankle Left: Right: Point of Measurement: 10 cm From Medial Instep 23.4 cm 23.5 cm Electronic Signature(s) Signed: 04/19/2021 5:45:49 PM By: Deon Pilling RN, BSN Entered By: Deon Pilling on 04/19/2021 14:27:08 -------------------------------------------------------------------------------- Multi Wound Chart Details Patient Name: Date of Service: [name], [name] 04/19/2021 1:30 PM Medical Record Number: [id number] Patient Account Number: [id number] Date of Birth/Sex: Treating RN: Feb 24, 1966 (55 y.o. [name] Primary Care Linsey Hirota: [name] Other Clinician: Referring [name]: Treating Litzi Binning/Extender: Debbra Riding in Treatment: 8 Vital  Signs Height(in): 13 Pulse(bpm): 19 Weight(lbs): 157 Blood Pressure(mmHg): 117/78 Body Mass Index(BMI): 28 Temperature(F): 98.3 Respiratory Rate(breaths/min): 16 Photos: [1:No Photos Right, Medial Ankle] [2:No Photos Right, Dorsal Foot] [3:No Photos Left, Medial Ankle] Wound Location: [1:Gradually Appeared] [2:Gradually Appeared] [3:Gradually Appeared] Wounding Event: [1:Diabetic Wound/Ulcer of the Lower] [2:Diabetic Wound/Ulcer of the Lower] [3:Diabetic Wound/Ulcer of the Lower] Primary Etiology: [1:Extremity Anemia, Arrhythmia, Congestive Heart Anemia, Arrhythmia, Congestive Heart Anemia, Arrhythmia, Congestive Heart] [2:Extremity] [3:Extremity] Comorbid History: [1:Failure, Hypertension, Type II Diabetes, Lupus Erythematosus  04/03/2020] [2:Failure, Hypertension, Type II Diabetes, Lupus Erythematosus 04/03/2020] [3:Failure, Hypertension, Type II Diabetes, Lupus Erythematosus 02/22/2021] Date Acquired: [1:8] [2:8] [3:8] Weeks of Treatment: [1:Open] [2:Open] [3:Open] Wound Status: [1:5.4x4.5x0.1] [2:6.5x5.8x0.1] [3:1x2.5x0.1] Measurements L x W x D (cm) [1:19.085] [2:29.61] [3:1.963] A (cm) : rea [1:1.909] [2:2.961] [3:0.196] Volume (cm) : [1:-15.70%] [2:-151.30%] [3:0.00%] % Reduction in Area: [1:42.10%] [2:-151.40%] [3:50.10%] % Reduction in Volume: [1:Grade 2] [2:Grade 2] [3:Grade 2] Classification: [1:Medium] [2:Medium] [3:Medium] Exudate A mount: [1:Serosanguineous] [2:Serosanguineous] [3:Serosanguineous] Exudate Type: [1:red, brown] [2:red, brown] [3:red, brown] Exudate Color: [1:Distinct, outline attached] [2:Distinct, outline attached] [3:Distinct, outline attached] Wound Margin: [1:Small (1-33%)] [2:Small (1-33%)] [3:Large (67-100%)] Granulation A mount: [1:Red] [2:Red] [3:Red] Granulation Quality: [1:Large (67-100%)] [2:Large (67-100%)] [3:Small (1-33%)] Necrotic A mount: [1:Fat Layer (Subcutaneous Tissue): Yes Fat Layer (Subcutaneous Tissue): Yes Fat Layer (Subcutaneous  Tissue): Yes] Exposed Structures: [1:Fascia: No Tendon: No Muscle: No Joint: No Bone: No Small (1-33%)] [2:Fascia: No Tendon: No Muscle: No Joint: No Bone: No None] [3:Fascia: No Tendon: No Muscle: No Joint: No Bone: No Medium (34-66%)] Wound Number: 4 5 6  Photos: No Photos No Photos No Photos Left, Lateral Ankle Left, Dorsal Foot Left, Distal, Medial Foot Wound Location: Gradually Appeared Gradually Appeared Gradually Appeared Wounding Event: Diabetic Wound/Ulcer of the Lower Diabetic Wound/Ulcer of the Lower Diabetic Wound/Ulcer of the Lower Primary Etiology: Extremity Extremity Extremity Anemia, Arrhythmia, Congestive Heart Anemia, Arrhythmia, Congestive Heart Anemia, Arrhythmia, Congestive Heart Comorbid History: Failure, Hypertension, Type II Failure, Hypertension, Type II Failure, Hypertension, Type II Diabetes, Lupus Erythematosus Diabetes, Lupus Erythematosus Diabetes, Lupus Erythematosus 04/03/2020 04/03/2020 04/03/2020 Date Acquired: 8 8 8  Weeks of Treatment: Open Open Open Wound Status: 8x5.4x0.1 10x9.5x0.1 0.5x1x0.1 Measurements L x W x D (cm) 33.929 74.613 0.393 A (cm) : rea 3.393 7.461 0.039 Volume (cm) : 10.00% -24.20% -25.20% % Reduction in A rea: 55.00% -24.20% -25.80% % Reduction in Volume: Grade 2 Grade 2 Grade 2 Classification: Medium Medium Medium Exudate A mount: Serosanguineous Serosanguineous Serosanguineous Exudate Type: red, brown red, brown red, brown Exudate Color: Distinct, outline attached Distinct, outline attached Distinct, outline attached Wound Margin: Medium (34-66%) Small (1-33%) Large (67-100%) Granulation A mount: Red Red Red, Pink Granulation Quality: Medium (34-66%) Large (67-100%) Small (1-33%) Necrotic A mount: Fat Layer (Subcutaneous Tissue): Yes Fat Layer (Subcutaneous Tissue): Yes Fat Layer (Subcutaneous Tissue): Yes Exposed Structures: Fascia: No Fascia: No Fascia: No Tendon: No Tendon: No Tendon: No Muscle:  No Muscle: No Muscle: No Joint: No Joint: No Joint: No Bone: No Bone: No Bone: No Small (1-33%) None Medium (34-66%) Epithelialization: Wound Number: 7 N/A N/A Photos: No Photos N/A N/A Right, Lateral Ankle N/A N/A Wound Location: Gradually Appeared N/A N/A Wounding Event: Lupus N/A N/A Primary Etiology: Anemia, Arrhythmia, Congestive Heart N/A N/A Comorbid History: Failure, Hypertension, Type II Diabetes, Lupus Erythematosus 04/08/2021 N/A N/A Date Acquired: 1 N/A N/A Weeks of Treatment: Open N/A N/A Wound Status: 0.7x0.8x0.1 N/A N/A Measurements L x W x D (cm) 0.44 N/A N/A A (cm) : rea 0.044 N/A N/A Volume (cm) : -1319.40% N/A N/A % Reduction in Area: -1366.70% N/A N/A % Reduction in Volume: Full Thickness Without Exposed N/A N/A Classification: Support Structures Medium N/A N/A Exudate Amount: Serosanguineous N/A N/A Exudate Type: red, brown N/A N/A Exudate Color: Distinct, outline attached N/A N/A Wound Margin: Medium (34-66%) N/A N/A Granulation Amount: Red, Pink N/A N/A Granulation Quality: Medium (34-66%) N/A N/A Necrotic Amount: Fat Layer (Subcutaneous Tissue): Yes N/A N/A Exposed Structures: Fascia: No Tendon: No Muscle: No Joint: No Bone:  No None N/A N/A Epithelialization: Treatment Notes Electronic Signature(s) Signed: 04/19/2021 5:32:36 PM By: Linton Ham MD Signed: 04/19/2021 5:48:05 PM By: Baruch Gouty RN, BSN Entered By: Linton Ham on 04/19/2021 15:12:14 -------------------------------------------------------------------------------- Multi-Disciplinary Care Plan Details Patient Name: Date of Service: [name], [name] 04/19/2021 1:30 PM Medical Record Number: [id number] Patient Account Number: [id number] Date of Birth/Sex: Treating RN: 22-Feb-1966 (55 y.o. [name] Primary Care Venezia Sargeant: [name] Other Clinician: Referring Katricia Prehn: Treating Yuji Walth/Extender: Debbra Riding in Treatment: 8 Active Inactive Wound/Skin Impairment Nursing Diagnoses: Impaired tissue integrity Goals: Patient/caregiver will verbalize understanding of skin care regimen Date Initiated: 02/22/2021 Target Resolution Date: 04/29/2021 Goal Status: Active Interventions: Assess patient/caregiver ability to obtain necessary supplies Assess patient/caregiver ability to perform ulcer/skin care regimen upon admission and as needed Assess ulceration(s) every visit Provide education on ulcer and skin care Treatment Activities: Topical wound management initiated : 02/22/2021 Notes: 03/25/21: All wounds not yet at 30%, culture positive. Using gent cream. Electronic Signature(s) Signed: 04/19/2021 5:43:12 PM By: Levan Hurst RN, BSN Entered By: Levan Hurst on 04/19/2021 14:37:49 -------------------------------------------------------------------------------- Pain Assessment Details Patient Name: Date of Service: [name], [name] 04/19/2021 1:30 PM Medical Record Number: [id number] Patient Account Number: [id number] Date of Birth/Sex: Treating RN: [birth year] (55 y.o. Debby Bud Primary Care Shonn Farruggia: [name] Other Clinician: Referring Luiscarlos Kaczmarczyk: Treating [place] Festa/Extender: Debbra Riding in Treatment: 8 Active Problems Location of Pain Severity and Description of Pain Patient Has Paino Yes Site Locations Pain Location: Pain Location: Pain in Ulcers With Dressing Change: Yes Duration of the Pain. Constant / Intermittento Intermittent Rate the pain. Current Pain Level: 3 Character of Pain Describe the Pain: Throbbing Pain Management and Medication Current Pain Management: Medication: Yes Cold Application: No Rest: No Massage: No Activity: No T.E.N.S.: No Heat Application: No Leg drop or elevation: No Is the Current Pain Management Adequate: Adequate How does your wound impact your activities of daily  livingo Sleep: No Bathing: No Appetite: No Relationship With Others: No Bladder Continence: No Emotions: No Bowel Continence: No Work: No Toileting: No Drive: No Dressing: No Hobbies: No Engineer, maintenance) Signed: 04/19/2021 5:45:49 PM By: Deon Pilling RN, BSN Entered By: Deon Pilling on 04/19/2021 13:57:00 -------------------------------------------------------------------------------- Patient/Caregiver Education Details Patient Name: Date of Service: [name], [name] 10/17/2022andnbsp1:30 PM Medical Record Number: [id number] Patient Account Number: [id number] Date of Birth/Gender: Treating RN: 06-30-1966 (55 y.o. [name] Primary Care Physician: [name] Other Clinician: Referring Physician: Treating Physician/Extender: Debbra Riding in Treatment: 8 Education Assessment Education Provided To: Patient Education Topics Provided Wound/Skin Impairment: Methods: Explain/Verbal Responses: State content correctly Electronic Signature(s) Signed: 04/19/2021 5:43:12 PM By: Levan Hurst RN, BSN Entered By: Levan Hurst on 04/19/2021 14:38:02 -------------------------------------------------------------------------------- Wound Assessment Details Patient Name: Date of Service: [name], [name] 04/19/2021 1:30 PM Medical Record Number: [id number] Patient Account Number: [id number] Date of Birth/Sex: Treating RN: 06-10-1966 (55 y.o. [name] Primary Care Dicie Edelen: [name] Other Clinician: Referring Zoi Devine: Treating Jakyren Fluegge/Extender: Debbra Riding in Treatment: 8 Wound Status Wound Number: 1 Primary Diabetic Wound/Ulcer of the Lower Extremity Etiology: Wound Location: Right, Medial Ankle Wound Open Wounding Event: Gradually Appeared Status: Date Acquired: 04/03/2020 Comorbid Anemia, Arrhythmia, Congestive Heart Failure, Hypertension, Weeks Of Treatment:  8 History: Type II Diabetes, Lupus Erythematosus Clustered Wound: No Wound Measurements Length: (cm) 5.4 Width: (cm) 4.5 Depth: (cm)  0.1 Area: (cm) 19.085 Volume: (cm) 1.909 % Reduction in Area: -15.7% % Reduction in Volume: 42.1% Epithelialization: Small (1-33%) Tunneling: No Undermining: No Wound Description Classification: Grade 2 Wound Margin: Distinct, outline attached Exudate Amount: Medium Exudate Type: Serosanguineous Exudate Color: red, brown Foul Odor After Cleansing: No Slough/Fibrino Yes Wound Bed Granulation Amount: Small (1-33%) Exposed Structure Granulation Quality: Red Fascia Exposed: No Necrotic Amount: Large (67-100%) Fat Layer (Subcutaneous Tissue) Exposed: Yes Necrotic Quality: Adherent Slough Tendon Exposed: No Muscle Exposed: No Joint Exposed: No Bone Exposed: No Treatment Notes Wound #1 (Ankle) Wound Laterality: Right, Medial Cleanser Soap and Water Discharge Instruction: May shower and wash wound with dial antibacterial soap and water prior to dressing change. Wound Cleanser Discharge Instruction: Cleanse the wound with wound cleanser prior to applying a clean dressing using gauze sponges, not tissue or cotton balls. Peri-Wound Care Topical Primary Dressing Santyl Ointment Discharge Instruction: Apply nickel thick amount to wound bed as instructed Secondary Dressing ABD Pad, 8x10 Discharge Instruction: Apply over primary dressing as directed. NonWoven Sponge, 4x4 (in/in) Secured With Elastic Bandage 4 inch (ACE bandage) Discharge Instruction: Secure with ACE bandage as directed. Kerlix Roll Sterile, 4.5x3.1 (in/yd) Discharge Instruction: Secure with Kerlix as directed. Transpore Surgical Tape, 2x10 (in/yd) Discharge Instruction: Secure dressing with tape as directed. Compression Wrap Compression Stockings Add-Ons Electronic Signature(s) Signed: 04/19/2021 5:43:12 PM By: Levan Hurst RN, BSN Signed: 04/19/2021 5:45:49 PM By:  Deon Pilling RN, BSN Entered By: Deon Pilling on 04/19/2021 14:20:36 -------------------------------------------------------------------------------- Wound Assessment Details Patient Name: Date of Service: [name], [name] 04/19/2021 1:30 PM Medical Record Number: [id number] Patient Account Number: [id number] Date of Birth/Sex: Treating RN: [birth year] (55 y.o. [name], Meta.Reding Primary Care Saivion Goettel: [name] Other Clinician: Referring Chas Axel: Treating Gricel Copen/Extender: Debbra Riding in Treatment: 8 Wound Status Wound Number: 2 Primary Diabetic Wound/Ulcer of the Lower Extremity Etiology: Wound Location: Right, Dorsal Foot Wound Open Wounding Event: Gradually Appeared Status: Date Acquired: 04/03/2020 Comorbid Anemia, Arrhythmia, Congestive Heart Failure, Hypertension, Weeks Of Treatment: 8 History: Type II Diabetes, Lupus Erythematosus Clustered Wound: No Wound Measurements Length: (cm) 6.5 Width: (cm) 5.8 Depth: (cm) 0.1 Area: (cm) 29.61 Volume: (cm) 2.961 % Reduction in Area: -151.3% % Reduction in Volume: -151.4% Epithelialization: None Tunneling: No Undermining: No Wound Description Classification: Grade 2 Wound Margin: Distinct, outline attached Exudate Amount: Medium Exudate Type: Serosanguineous Exudate Color: red, brown Foul Odor After Cleansing: No Slough/Fibrino Yes Wound Bed Granulation Amount: Small (1-33%) Exposed Structure Granulation Quality: Red Fascia Exposed: No Necrotic Amount: Large (67-100%) Fat Layer (Subcutaneous Tissue) Exposed: Yes Necrotic Quality: Adherent Slough Tendon Exposed: No Muscle Exposed: No Joint Exposed: No Bone Exposed: No Treatment Notes Wound #2 (Foot) Wound Laterality: Dorsal, Right Cleanser Soap and Water Discharge Instruction: May shower and wash wound with dial antibacterial soap and water prior to dressing change. Wound Cleanser Discharge Instruction: Cleanse  the wound with wound cleanser prior to applying a clean dressing using gauze sponges, not tissue or cotton balls. Peri-Wound Care Topical Primary Dressing Santyl Ointment Discharge Instruction: Apply nickel thick amount to wound bed as instructed Secondary Dressing ABD Pad, 8x10 Discharge Instruction: Apply over primary dressing as directed. NonWoven Sponge, 4x4 (in/in) Secured With Elastic Bandage 4 inch (ACE bandage) Discharge Instruction: Secure with ACE bandage as directed. Kerlix Roll Sterile, 4.5x3.1 (in/yd) Discharge Instruction: Secure with Kerlix as directed. Transpore Surgical Tape, 2x10 (in/yd) Discharge Instruction: Secure dressing with tape as directed. Compression Wrap Compression Stockings Add-Ons Electronic Signature(s) Signed: 04/19/2021  5:45:49 PM By: Deon Pilling RN, BSN Entered By: Deon Pilling on 04/19/2021 14:26:26 -------------------------------------------------------------------------------- Wound Assessment Details Patient Name: Date of Service: [name], [name] 04/19/2021 1:30 PM Medical Record Number: [id number] Patient Account Number: [id number] Date of Birth/Sex: Treating RN: 1966/05/14 (55 y.o. [name], Meta.Reding Primary Care Rue Tinnel: [name] Other Clinician: Referring Aldyn Toon: Treating Danner Paulding/Extender: Debbra Riding in Treatment: 8 Wound Status Wound Number: 3 Primary Diabetic Wound/Ulcer of the Lower Extremity Etiology: Wound Location: Left, Medial Ankle Wound Open Wounding Event: Gradually Appeared Status: Date Acquired: 02/22/2021 Comorbid Anemia, Arrhythmia, Congestive Heart Failure, Hypertension, Weeks Of Treatment: 8 History: Type II Diabetes, Lupus Erythematosus Clustered Wound: No Wound Measurements Length: (cm) 1 Width: (cm) 2.5 Depth: (cm) 0.1 Area: (cm) 1.963 Volume: (cm) 0.196 % Reduction in Area: 0% % Reduction in Volume: 50.1% Epithelialization: Medium  (34-66%) Tunneling: No Undermining: No Wound Description Classification: Grade 2 Wound Margin: Distinct, outline attached Exudate Amount: Medium Exudate Type: Serosanguineous Exudate Color: red, brown Foul Odor After Cleansing: No Slough/Fibrino Yes Wound Bed Granulation Amount: Large (67-100%) Exposed Structure Granulation Quality: Red Fascia Exposed: No Necrotic Amount: Small (1-33%) Fat Layer (Subcutaneous Tissue) Exposed: Yes Necrotic Quality: Adherent Slough Tendon Exposed: No Muscle Exposed: No Joint Exposed: No Bone Exposed: No Treatment Notes Wound #3 (Ankle) Wound Laterality: Left, Medial Cleanser Soap and Water Discharge Instruction: May shower and wash wound with dial antibacterial soap and water prior to dressing change. Wound Cleanser Discharge Instruction: Cleanse the wound with wound cleanser prior to applying a clean dressing using gauze sponges, not tissue or cotton balls. Peri-Wound Care Topical Primary Dressing Santyl Ointment Discharge Instruction: Apply nickel thick amount to wound bed as instructed Secondary Dressing ABD Pad, 8x10 Discharge Instruction: Apply over primary dressing as directed. NonWoven Sponge, 4x4 (in/in) Secured With Elastic Bandage 4 inch (ACE bandage) Discharge Instruction: Secure with ACE bandage as directed. Kerlix Roll Sterile, 4.5x3.1 (in/yd) Discharge Instruction: Secure with Kerlix as directed. Transpore Surgical Tape, 2x10 (in/yd) Discharge Instruction: Secure dressing with tape as directed. Compression Wrap Compression Stockings Add-Ons Electronic Signature(s) Signed: 04/19/2021 5:45:49 PM By: Deon Pilling RN, BSN Entered By: Deon Pilling on 04/19/2021 14:24:05 -------------------------------------------------------------------------------- Wound Assessment Details Patient Name: Date of Service: [name], [name] 04/19/2021 1:30 PM Medical Record Number: [id number] Patient Account Number: [id number] Date  of Birth/Sex: Treating RN: [birth year] (55 y.o. [name], Meta.Reding Primary Care Nadyne Gariepy: [name] Other Clinician: Referring Akeel Reffner: Treating Karisma Meiser/Extender: Debbra Riding in Treatment: 8 Wound Status Wound Number: 4 Primary Diabetic Wound/Ulcer of the Lower Extremity Etiology: Wound Location: Left, Lateral Ankle Wound Open Wounding Event: Gradually Appeared Status: Date Acquired: 04/03/2020 Comorbid Anemia, Arrhythmia, Congestive Heart Failure, Hypertension, Weeks Of Treatment: 8 History: Type II Diabetes, Lupus Erythematosus Clustered Wound: No Wound Measurements Length: (cm) 8 Width: (cm) 5.4 Depth: (cm) 0.1 Area: (cm) 33.929 Volume: (cm) 3.393 % Reduction in Area: 10% % Reduction in Volume: 55% Epithelialization: Small (1-33%) Tunneling: No Undermining: No Wound Description Classification: Grade 2 Wound Margin: Distinct, outline attached Exudate Amount: Medium Exudate Type: Serosanguineous Exudate Color: red, brown Foul Odor After Cleansing: No Slough/Fibrino Yes Wound Bed Granulation Amount: Medium (34-66%) Exposed Structure Granulation Quality: Red Fascia Exposed: No Necrotic Amount: Medium (34-66%) Fat Layer (Subcutaneous Tissue) Exposed: Yes Necrotic Quality: Adherent Slough Tendon Exposed: No Muscle Exposed: No Joint Exposed: No Bone Exposed: No Treatment Notes Wound #4 (Ankle) Wound Laterality: Left, Lateral Cleanser Soap and Water Discharge Instruction: May shower  and wash wound with dial antibacterial soap and water prior to dressing change. Wound Cleanser Discharge Instruction: Cleanse the wound with wound cleanser prior to applying a clean dressing using gauze sponges, not tissue or cotton balls. Peri-Wound Care Topical Primary Dressing Santyl Ointment Discharge Instruction: Apply nickel thick amount to wound bed as instructed Secondary Dressing ABD Pad, 8x10 Discharge Instruction: Apply over primary  dressing as directed. NonWoven Sponge, 4x4 (in/in) Secured With Elastic Bandage 4 inch (ACE bandage) Discharge Instruction: Secure with ACE bandage as directed. Kerlix Roll Sterile, 4.5x3.1 (in/yd) Discharge Instruction: Secure with Kerlix as directed. Transpore Surgical Tape, 2x10 (in/yd) Discharge Instruction: Secure dressing with tape as directed. Compression Wrap Compression Stockings Add-Ons Electronic Signature(s) Signed: 04/19/2021 5:45:49 PM By: Deon Pilling RN, BSN Entered By: Deon Pilling on 04/19/2021 14:22:27 -------------------------------------------------------------------------------- Wound Assessment Details Patient Name: Date of Service: [name], [name] 04/19/2021 1:30 PM Medical Record Number: [id number] Patient Account Number: [id number] Date of Birth/Sex: Treating RN: 1965/07/17 (55 y.o. [name], Meta.Reding Primary Care Psalm Schappell: [name] Other Clinician: Referring Marelin Tat: Treating Berda Shelvin/Extender: Debbra Riding in Treatment: 8 Wound Status Wound Number: 5 Primary Diabetic Wound/Ulcer of the Lower Extremity Etiology: Wound Location: Left, Dorsal Foot Wound Open Wounding Event: Gradually Appeared Status: Date Acquired: 04/03/2020 Comorbid Anemia, Arrhythmia, Congestive Heart Failure, Hypertension, Weeks Of Treatment: 8 History: Type II Diabetes, Lupus Erythematosus Clustered Wound: No Wound Measurements Length: (cm) 10 Width: (cm) 9.5 Depth: (cm) 0.1 Area: (cm) 74.613 Volume: (cm) 7.461 % Reduction in Area: -24.2% % Reduction in Volume: -24.2% Epithelialization: None Tunneling: No Undermining: No Wound Description Classification: Grade 2 Wound Margin: Distinct, outline attached Exudate Amount: Medium Exudate Type: Serosanguineous Exudate Color: red, brown Foul Odor After Cleansing: No Slough/Fibrino Yes Wound Bed Granulation Amount: Small (1-33%) Exposed Structure Granulation Quality:  Red Fascia Exposed: No Necrotic Amount: Large (67-100%) Fat Layer (Subcutaneous Tissue) Exposed: Yes Necrotic Quality: Adherent Slough Tendon Exposed: No Muscle Exposed: No Joint Exposed: No Bone Exposed: No Treatment Notes Wound #5 (Foot) Wound Laterality: Dorsal, Left Cleanser Soap and Water Discharge Instruction: May shower and wash wound with dial antibacterial soap and water prior to dressing change. Wound Cleanser Discharge Instruction: Cleanse the wound with wound cleanser prior to applying a clean dressing using gauze sponges, not tissue or cotton balls. Peri-Wound Care Topical Primary Dressing Santyl Ointment Discharge Instruction: Apply nickel thick amount to wound bed as instructed Secondary Dressing ABD Pad, 8x10 Discharge Instruction: Apply over primary dressing as directed. NonWoven Sponge, 4x4 (in/in) Secured With Elastic Bandage 4 inch (ACE bandage) Discharge Instruction: Secure with ACE bandage as directed. Kerlix Roll Sterile, 4.5x3.1 (in/yd) Discharge Instruction: Secure with Kerlix as directed. Transpore Surgical Tape, 2x10 (in/yd) Discharge Instruction: Secure dressing with tape as directed. Compression Wrap Compression Stockings Add-Ons Electronic Signature(s) Signed: 04/19/2021 5:45:49 PM By: Deon Pilling RN, BSN Entered By: Deon Pilling on 04/19/2021 14:21:45 -------------------------------------------------------------------------------- Wound Assessment Details Patient Name: Date of Service: [name], [name] 04/19/2021 1:30 PM Medical Record Number: [id number] Patient Account Number: [id number] Date of Birth/Sex: Treating RN: [birth year] (55 y.o. [name], Tammi Klippel Primary Care Abdul Beirne: [name] Other Clinician: Referring Azariel Banik: Treating Colie Fugitt/Extender: Debbra Riding in Treatment: 8 Wound Status Wound Number: 6 Primary Diabetic Wound/Ulcer of the Lower Extremity Etiology: Wound Location:  Left, Distal, Medial Foot Wound Open Wounding Event: Gradually Appeared Status: Date Acquired: 04/03/2020 Comorbid Anemia, Arrhythmia, Congestive Heart Failure, Hypertension, Weeks Of Treatment: 8 History: Type II Diabetes,  Lupus Erythematosus Clustered Wound: No Wound Measurements Length: (cm) 0.5 Width: (cm) 1 Depth: (cm) 0.1 Area: (cm) 0.393 Volume: (cm) 0.039 % Reduction in Area: -25.2% % Reduction in Volume: -25.8% Epithelialization: Medium (34-66%) Tunneling: No Undermining: No Wound Description Classification: Grade 2 Wound Margin: Distinct, outline attached Exudate Amount: Medium Exudate Type: Serosanguineous Exudate Color: red, brown Foul Odor After Cleansing: No Slough/Fibrino Yes Wound Bed Granulation Amount: Large (67-100%) Exposed Structure Granulation Quality: Red, Pink Fascia Exposed: No Necrotic Amount: Small (1-33%) Fat Layer (Subcutaneous Tissue) Exposed: Yes Necrotic Quality: Adherent Slough Tendon Exposed: No Muscle Exposed: No Joint Exposed: No Bone Exposed: No Treatment Notes Wound #6 (Foot) Wound Laterality: Left, Medial, Distal Cleanser Soap and Water Discharge Instruction: May shower and wash wound with dial antibacterial soap and water prior to dressing change. Wound Cleanser Discharge Instruction: Cleanse the wound with wound cleanser prior to applying a clean dressing using gauze sponges, not tissue or cotton balls. Peri-Wound Care Topical Primary Dressing Santyl Ointment Discharge Instruction: Apply nickel thick amount to wound bed as instructed Secondary Dressing ABD Pad, 8x10 Discharge Instruction: Apply over primary dressing as directed. NonWoven Sponge, 4x4 (in/in) Secured With Elastic Bandage 4 inch (ACE bandage) Discharge Instruction: Secure with ACE bandage as directed. Kerlix Roll Sterile, 4.5x3.1 (in/yd) Discharge Instruction: Secure with Kerlix as directed. Transpore Surgical Tape, 2x10 (in/yd) Discharge Instruction:  Secure dressing with tape as directed. Compression Wrap Compression Stockings Add-Ons Electronic Signature(s) Signed: 04/19/2021 5:45:49 PM By: Deon Pilling RN, BSN Entered By: Deon Pilling on 04/19/2021 14:23:21 -------------------------------------------------------------------------------- Wound Assessment Details Patient Name: Date of Service: [name], [name] 04/19/2021 1:30 PM Medical Record Number: [id number] Patient Account Number: [id number] Date of Birth/Sex: Treating RN: 1966/06/16 (55 y.o. [name], Meta.Reding Primary Care Lewanna Petrak: [name] Other Clinician: Referring Jinger Middlesworth: Treating Holden Maniscalco/Extender: Debbra Riding in Treatment: 8 Wound Status Wound Number: 7 Primary Lupus Etiology: Wound Location: Right, Lateral Ankle Wound Open Wounding Event: Gradually Appeared Status: Date Acquired: 04/08/2021 Comorbid Anemia, Arrhythmia, Congestive Heart Failure, Hypertension, Weeks Of Treatment: 1 History: Type II Diabetes, Lupus Erythematosus Clustered Wound: No Wound Measurements Length: (cm) 0.7 Width: (cm) 0.8 Depth: (cm) 0.1 Area: (cm) 0.44 Volume: (cm) 0.044 % Reduction in Area: -1319.4% % Reduction in Volume: -1366.7% Epithelialization: None Tunneling: No Undermining: No Wound Description Classification: Full Thickness Without Exposed Support Structures Wound Margin: Distinct, outline attached Exudate Amount: Medium Exudate Type: Serosanguineous Exudate Color: red, brown Foul Odor After Cleansing: No Slough/Fibrino Yes Wound Bed Granulation Amount: Medium (34-66%) Exposed Structure Granulation Quality: Red, Pink Fascia Exposed: No Necrotic Amount: Medium (34-66%) Fat Layer (Subcutaneous Tissue) Exposed: Yes Necrotic Quality: Adherent Slough Tendon Exposed: No Muscle Exposed: No Joint Exposed: No Bone Exposed: No Treatment Notes Wound #7 (Ankle) Wound Laterality: Right, Lateral Cleanser Soap and  Water Discharge Instruction: May shower and wash wound with dial antibacterial soap and water prior to dressing change. Wound Cleanser Discharge Instruction: Cleanse the wound with wound cleanser prior to applying a clean dressing using gauze sponges, not tissue or cotton balls. Peri-Wound Care Topical Primary Dressing Santyl Ointment Discharge Instruction: Apply nickel thick amount to wound bed as instructed Secondary Dressing ABD Pad, 8x10 Discharge Instruction: Apply over primary dressing as directed. NonWoven Sponge, 4x4 (in/in) Secured With Elastic Bandage 4 inch (ACE bandage) Discharge Instruction: Secure with ACE bandage as directed. Kerlix Roll Sterile, 4.5x3.1 (in/yd) Discharge Instruction: Secure with Kerlix as directed. Transpore Surgical Tape, 2x10 (in/yd) Discharge Instruction: Secure dressing with tape as directed. Compression  Wrap Compression Stockings Environmental education officer) Signed: 04/19/2021 5:45:49 PM By: Deon Pilling RN, BSN Entered By: Deon Pilling on 04/19/2021 14:25:17 -------------------------------------------------------------------------------- Vitals Details Patient Name: Date of Service: [name], [name] 04/19/2021 1:30 PM Medical Record Number: [id number] Patient Account Number: [id number] Date of Birth/Sex: Treating RN: [birth year] (55 y.o. [name], Tammi Klippel Primary Care Miho Monda: [name] Other Clinician: Referring Caeley Dohrmann: Treating Darlisa Spruiell/Extender: Debbra Riding in Treatment: 8 Vital Signs Time Taken: 13:55 Temperature (F): 98.3 Height (in): 63 Pulse (bpm): 71 Weight (lbs): 157 Respiratory Rate (breaths/min): 16 Body Mass Index (BMI): 27.8 Blood Pressure (mmHg): 117/78 Reference Range: 80 - 120 mg / dl Electronic Signature(s) Signed: 04/19/2021 5:45:49 PM By: Deon Pilling RN, BSN Entered By: Deon Pilling on 04/19/2021 13:56:38

## 2021-04-19 NOTE — Progress Notes (Signed)
BLEN, RANSOME ([id number]) Visit Report for 04/19/2021 Debridement Details Patient Name: Date of Service: Candace Robinson, Candace RO L L. 04/19/2021 1:30 PM Medical Record Number: [id number] Patient Account Number: [id number] Date of Birth/Sex: Treating RN: [birth year] (55 y.o. Candace Robinson Primary Care Provider: [name] Other Clinician: Referring Provider: Treating Provider/Extender: Debbra Riding in Treatment: 8 Debridement Performed for Assessment: Wound #1 Right,Medial Ankle Performed By: Clinician Baruch Gouty, RN Debridement Type: Chemical/Enzymatic/Mechanical Agent Used: Santyl Severity of Tissue Pre Debridement: Fat layer exposed Level of Consciousness (Pre-procedure): Awake and Alert Pre-procedure Verification/Time Out No Taken: Start Time: 15:00 Pain Control: Lidocaine 5% topical ointment Bleeding: None Response to Treatment: Procedure was tolerated well Level of Consciousness (Post- Awake and Alert procedure): Post Debridement Measurements of Total Wound Length: (cm) 5.4 Width: (cm) 4.5 Depth: (cm) 0.1 Volume: (cm) 1.909 Character of Wound/Ulcer Post Debridement: Requires Further Debridement Severity of Tissue Post Debridement: Fat layer exposed Post Procedure Diagnosis Same as Pre-procedure Electronic Signature(s) Signed: 04/19/2021 5:32:36 PM By: Linton Ham MD Signed: 04/19/2021 5:48:05 PM By: Baruch Gouty RN, BSN Entered By: Baruch Gouty on 04/19/2021 15:13:57 -------------------------------------------------------------------------------- Debridement Details Patient Name: Date of Service: Candace Robinson, Candace RO L L. 04/19/2021 1:30 PM Medical Record Number: [id number] Patient Account Number: [id number] Date of Birth/Sex: Treating RN: 03-14-1966 (55 y.o. Candace Robinson Primary Care Provider: [name] Other Clinician: Referring Provider: Treating Provider/Extender: Debbra Riding in Treatment: 8 Debridement Performed for Assessment: Wound #2 Right,Dorsal Foot Performed By: Clinician Baruch Gouty, RN Debridement Type: Chemical/Enzymatic/Mechanical Agent Used: Santyl Severity of Tissue Pre Debridement: Fat layer exposed Level of Consciousness (Pre-procedure): Awake and Alert Pre-procedure Verification/Time Out No Taken: Start Time: 15:00 Pain Control: Lidocaine 5% topical ointment Bleeding: None Response to Treatment: Procedure was tolerated well Level of Consciousness (Post- Awake and Alert procedure): Post Debridement Measurements of Total Wound Length: (cm) 6.5 Width: (cm) 58 Depth: (cm) 0.1 Volume: (cm) 29.61 Character of Wound/Ulcer Post Debridement: Requires Further Debridement Severity of Tissue Post Debridement: Fat layer exposed Post Procedure Diagnosis Same as Pre-procedure Electronic Signature(s) Signed: 04/19/2021 5:32:36 PM By: Linton Ham MD Signed: 04/19/2021 5:48:05 PM By: Baruch Gouty RN, BSN Entered By: Baruch Gouty on 04/19/2021 15:14:24 -------------------------------------------------------------------------------- Debridement Details Patient Name: Date of Service: Candace Robinson, Candace RO L L. 04/19/2021 1:30 PM Medical Record Number: [id number] Patient Account Number: [id number] Date of Birth/Sex: Treating RN: April 24, 1966 (55 y.o. Candace Robinson Primary Care Provider: [name] Other Clinician: Referring Provider: Treating Provider/Extender: Debbra Riding in Treatment: 8 Debridement Performed for Assessment: Wound #3 Left,Medial Ankle Performed By: Clinician Baruch Gouty, RN Debridement Type: Chemical/Enzymatic/Mechanical Agent Used: Santyl Severity of Tissue Pre Debridement: Fat layer exposed Level of Consciousness (Pre-procedure): Awake and Alert Pre-procedure Verification/Time Out No Taken: Start Time: 15:00 Pain Control: Lidocaine 5% topical  ointment Bleeding: None Response to Treatment: Procedure was tolerated well Level of Consciousness (Post- Awake and Alert procedure): Post Debridement Measurements of Total Wound Length: (cm) 1 Width: (cm) 2.5 Depth: (cm) 0.1 Volume: (cm) 0.196 Character of Wound/Ulcer Post Debridement: Requires Further Debridement Severity of Tissue Post Debridement: Fat layer exposed Post Procedure Diagnosis Same as Pre-procedure Electronic Signature(s) Signed: 04/19/2021 5:32:36 PM By: Linton Ham MD Signed: 04/19/2021 5:48:05 PM By: Baruch Gouty RN, BSN Entered By: Baruch Gouty on 04/19/2021 15:14:47 -------------------------------------------------------------------------------- Debridement Details Patient Name: Date of Service: Candace Robinson, Candace RO L L. 04/19/2021 1:30 PM Medical Record Number: [id number] Patient  Account Number: [id number] Date of Birth/Sex: Treating RN: December 29, 1965 (55 y.o. Candace Robinson Primary Care Provider: [name] Other Clinician: Referring Provider: Treating Provider/Extender: Debbra Riding in Treatment: 8 Debridement Performed for Assessment: Wound #4 Left,Lateral Ankle Performed By: Clinician Baruch Gouty, RN Debridement Type: Chemical/Enzymatic/Mechanical Agent Used: Santyl Severity of Tissue Pre Debridement: Fat layer exposed Level of Consciousness (Pre-procedure): Awake and Alert Pre-procedure Verification/Time Out No Taken: Start Time: 15:00 Pain Control: Lidocaine 5% topical ointment Bleeding: None Response to Treatment: Procedure was tolerated well Level of Consciousness (Post- Awake and Alert procedure): Post Debridement Measurements of Total Wound Length: (cm) 8 Width: (cm) 5.4 Depth: (cm) 0.1 Volume: (cm) 3.393 Character of Wound/Ulcer Post Debridement: Requires Further Debridement Severity of Tissue Post Debridement: Fat layer exposed Post Procedure Diagnosis Same as  Pre-procedure Electronic Signature(s) Signed: 04/19/2021 5:32:36 PM By: Linton Ham MD Signed: 04/19/2021 5:48:05 PM By: Baruch Gouty RN, BSN Entered By: Baruch Gouty on 04/19/2021 15:15:14 -------------------------------------------------------------------------------- Debridement Details Patient Name: Date of Service: Candace Robinson, Candace RO L L. 04/19/2021 1:30 PM Medical Record Number: [id number] Patient Account Number: [id number] Date of Birth/Sex: Treating RN: [birth year] (55 y.o. Candace Robinson Primary Care Provider: [name] Other Clinician: Referring Provider: Treating Provider/Extender: Debbra Riding in Treatment: 8 Debridement Performed for Assessment: Wound #5 Left,Dorsal Foot Performed By: Clinician Baruch Gouty, RN Debridement Type: Chemical/Enzymatic/Mechanical Agent Used: Santyl Severity of Tissue Pre Debridement: Fat layer exposed Level of Consciousness (Pre-procedure): Awake and Alert Pre-procedure Verification/Time Out No Taken: Start Time: 15:00 Pain Control: Lidocaine 5% topical ointment Bleeding: None Response to Treatment: Procedure was tolerated well Level of Consciousness (Post- Awake and Alert procedure): Post Debridement Measurements of Total Wound Length: (cm) 10 Width: (cm) 9.5 Depth: (cm) 0.1 Volume: (cm) 7.461 Character of Wound/Ulcer Post Debridement: Requires Further Debridement Severity of Tissue Post Debridement: Fat layer exposed Post Procedure Diagnosis Same as Pre-procedure Electronic Signature(s) Signed: 04/19/2021 5:32:36 PM By: Linton Ham MD Signed: 04/19/2021 5:48:05 PM By: Baruch Gouty RN, BSN Entered By: Baruch Gouty on 04/19/2021 15:15:41 -------------------------------------------------------------------------------- Debridement Details Patient Name: Date of Service: Candace Robinson, Candace RO L L. 04/19/2021 1:30 PM Medical Record Number: [id number] Patient Account  Number: [id number] Date of Birth/Sex: Treating RN: Jan 10, 1966 (55 y.o. Candace Robinson Primary Care Provider: [name] Other Clinician: Referring Provider: Treating Provider/Extender: Debbra Riding in Treatment: 8 Debridement Performed for Assessment: Wound #6 Left,Distal,Medial Foot Performed By: Clinician Baruch Gouty, RN Debridement Type: Chemical/Enzymatic/Mechanical Agent Used: Santyl Severity of Tissue Pre Debridement: Fat layer exposed Level of Consciousness (Pre-procedure): Awake and Alert Pre-procedure Verification/Time Out No Taken: Start Time: 15:00 Pain Control: Lidocaine 5% topical ointment Bleeding: None Response to Treatment: Procedure was tolerated well Level of Consciousness (Post- Awake and Alert procedure): Post Debridement Measurements of Total Wound Length: (cm) 0.5 Width: (cm) 1 Depth: (cm) 0.1 Volume: (cm) 0.039 Character of Wound/Ulcer Post Debridement: Requires Further Debridement Severity of Tissue Post Debridement: Fat layer exposed Post Procedure Diagnosis Same as Pre-procedure Electronic Signature(s) Signed: 04/19/2021 5:32:36 PM By: Linton Ham MD Signed: 04/19/2021 5:48:05 PM By: Baruch Gouty RN, BSN Entered By: Baruch Gouty on 04/19/2021 15:16:08 -------------------------------------------------------------------------------- Debridement Details Patient Name: Date of Service: Candace Robinson, Candace RO L L. 04/19/2021 1:30 PM Medical Record Number: [id number] Patient Account Number: [id number] Date of Birth/Sex: Treating RN: 1966/06/05 (55 y.o. Candace Robinson Primary Care Provider: [name] Other Clinician: Referring Provider: Treating Provider/Extender: Debbra Riding in  Treatment: 8 Debridement Performed for Assessment: Wound #7 Right,Lateral Ankle Performed By: Clinician Baruch Gouty, RN Debridement Type: Chemical/Enzymatic/Mechanical Agent Used:  Santyl Level of Consciousness (Pre-procedure): Awake and Alert Pre-procedure Verification/Time Out No Taken: Start Time: 15:00 Pain Control: Lidocaine 5% topical ointment Bleeding: None Response to Treatment: Procedure was tolerated well Level of Consciousness (Post- Awake and Alert procedure): Post Debridement Measurements of Total Wound Length: (cm) 0.7 Width: (cm) 0.8 Depth: (cm) 0.1 Volume: (cm) 0.044 Character of Wound/Ulcer Post Debridement: Requires Further Debridement Post Procedure Diagnosis Same as Pre-procedure Electronic Signature(s) Signed: 04/19/2021 5:32:36 PM By: Linton Ham MD Signed: 04/19/2021 5:48:05 PM By: Baruch Gouty RN, BSN Entered By: Baruch Gouty on 04/19/2021 15:16:30 -------------------------------------------------------------------------------- HPI Details Patient Name: Date of Service: Candace Robinson, Candace RO L L. 04/19/2021 1:30 PM Medical Record Number: [id number] Patient Account Number: [id number] Date of Birth/Sex: Treating RN: 12-16-1965 (55 y.o. Candace Robinson Primary Care Provider: [name] Other Clinician: Referring Provider: Treating Provider/Extender: Debbra Riding in Treatment: 8 History of Present Illness HPI Description: Admission 8/22 Candace Robinson is a 55 year old female with a past medical history of diet-controlled type 2 diabetes, hypothyroidism, chronic diastolic heart failure, cutaneous lupus erythematosus and essential hypertension that presents to the clinic for a 74-month history of worsening bilateral feet ulcers. She states that wounds to her feet have been an ongoing issue for several years. She states that with wound care they improve however they tend to wax and wane. 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 She was discharged to a nursing facility and she was provided wound care for her bilateral feet ulcers at that time. She states they  used Silvadene and silver alginate. She states that when she was discharged in April from the nursing facility her wounds had improved but were not fully closed. She currently uses Silvadene but has not been using silver alginate for several months due to running out. She reports significant decline in her wound healing over the last 4 months. She reports pain to the wound sites. She currently denies increased warmth erythema or purulent drainage. 8/29; patient presents for 1 week follow-up. She states she had ABIs completed without TBI's. She continues to use silver alginate with dressing changes. She reports continued pain. She denies signs of infection. 9/8; patient presents for 1 week follow-up. She was evaluated by Dr. Donzetta Matters and she reports follow-up with him in 1 month. She has been using Santyl to the wound beds. She reports some improvement in wound healing. She denies signs of infection. 9/15; patient presents for follow-up. She continues to use Santyl and silver alginate to the wound beds. She states the alginate is sticking and hard to remove with dressing changes. She currently denies signs of infection. 9/22; patient presents for 1 week follow-up. She is using Santyl and silver alginate to the wound beds. She ran out of [place] and has been using Silvadene. She states that gentamicin ointment is ready at her pharmacy and will be picking this up today. She has no issues or complaints today. She denies signs of infection. 9/30; she is using Santyl and silver alginate. She has substantial wounds on her bilateral feet and bilateral ankles that have been there for a year. She is in a lot of pain. She cannot bear to have these wounds touched let alone a mechanical debridement here. Furthermore the etiology of this is really not totally clear. She had reasonably normal ABIs with triphasic waveforms  in August [I did not review this however]. She is completing a course of ciprofloxacin for an area  on the left lateral ankle. She has had prior ablations and follows with vascular surgery. Finally she apparently has lupus 10/6; patient has been using Silvadene to the wound beds with dressing changes. She states she is picking up Santyl today. She has no issues or complaints today. She denies signs of infection. 10/17 the patient managed to get Santyl which the she is using to all wound beds. There is a large areas of breakdown here including substantially on the left distal foot, right foot involving the dorsal toes both medial ankles. All of these wounds are covered with some degree of very adherent fibrinous material. The patient did has an appointment with vascular surgery Dr. Donzetta Matters in 2 days time Electronic Signature(s) Signed: 04/19/2021 5:32:36 PM By: Linton Ham MD Entered By: Linton Ham on 04/19/2021 15:13:51 -------------------------------------------------------------------------------- Physical Exam Details Patient Name: Date of Service: Candace Robinson, Candace RO L L. 04/19/2021 1:30 PM Medical Record Number: [id number] Patient Account Number: [id number] Date of Birth/Sex: Treating RN: [birth year] (54 y.o. Candace Robinson Primary Care Provider: [name] Other Clinician: Referring Provider: Treating Provider/Extender: Debbra Riding in Treatment: 8 Constitutional Sitting or standing Blood Pressure is within target range for patient.. Pulse regular and within target range for patient.Marland Kitchen Respirations regular, non-labored and within target range.. Temperature is normal and within the target range for the patient.Marland Kitchen Appears in no distress. Cardiovascular Pedal pulses are palpable. Notes Wound exam; substantial wounds on the left greater than right dorsal foot bilateral ankles. All of these covered and very adherent nonviable surfaces and minimal granulation. There is no evidence of surrounding infection or inflammation. Her edema seems reasonably  well controlled. She has some evidence of chronic venous insufficiency Electronic Signature(s) Signed: 04/19/2021 5:32:36 PM By: Linton Ham MD Entered By: Linton Ham on 04/19/2021 15:17:06 -------------------------------------------------------------------------------- Physician Orders Details Patient Name: Date of Service: Candace Robinson, Candace RO L L. 04/19/2021 1:30 PM Medical Record Number: [id number] Patient Account Number: [id number] Date of Birth/Sex: Treating RN: 1965-12-04 (55 y.o. Candace Robinson Primary Care Provider: [name] Other Clinician: Referring Provider: Treating Provider/Extender: Debbra Riding in Treatment: 8 Verbal / Phone Orders: No Diagnosis Coding ICD-10 Coding Code Description [id number] Non-pressure chronic ulcer of other part of right foot with fat layer exposed L97.522 Non-pressure chronic ulcer of other part of left foot with fat layer exposed I73.9 Peripheral vascular disease, unspecified E11.9 Type 2 diabetes mellitus without complications A63.0 Other local lupus erythematosus Z60.10 Chronic diastolic (congestive) heart failure I10 Essential (primary) hypertension E11.621 Type 2 diabetes mellitus with foot ulcer Follow-up Appointments ppointment in 1 week. - any day any doctor ***EXTRA TIME 75 MINUTES*** Return A Other: - [facility]=Supplies Bathing/ Shower/ Hygiene May shower and wash wound with soap and water. - when dressings changed Edema Control - Lymphedema / SCD / Other Elevate legs to the level of the heart or above for 30 minutes daily and/or when sitting, a frequency of: - throughout the day Avoid standing for long periods of time. Additional Orders / Instructions Follow Nutritious Diet - 100-120g of protein daily Wound Treatment Wound #1 - Ankle Wound Laterality: Right, Medial Cleanser: Soap and Water 1 Robinson Per Day/30 Days Discharge Instructions: May shower and wash wound with dial  antibacterial soap and water prior to dressing change. Cleanser: Wound Cleanser 1 Robinson Per Day/30 Days Discharge Instructions: Cleanse the  wound with wound cleanser prior to applying a clean dressing using gauze sponges, not tissue or cotton balls. Prim Dressing: Santyl Ointment 1 Robinson Per Day/30 Days ary Discharge Instructions: Apply nickel thick amount to wound bed as instructed Secondary Dressing: ABD Pad, 8x10 (DME) (Generic) 1 Robinson Per Day/30 Days Discharge Instructions: Apply over primary dressing as directed. Secondary Dressing: NonWoven Sponge, 4x4 (in/in) (DME) (Generic) 1 Robinson Per Day/30 Days Secured With: Elastic Bandage 4 inch (ACE bandage) 1 Robinson Per Day/30 Days Discharge Instructions: Secure with ACE bandage as directed. Secured With: [employer], 4.5x3.1 (in/yd) 1 Robinson Per Day/30 Days Discharge Instructions: Secure with Kerlix as directed. Secured With: Transpore Surgical Tape, 2x10 (in/yd) 1 Robinson Per Day/30 Days Discharge Instructions: Secure dressing with tape as directed. Wound #2 - Foot Wound Laterality: Dorsal, Right Cleanser: Soap and Water 1 Robinson Per Day/30 Days Discharge Instructions: May shower and wash wound with dial antibacterial soap and water prior to dressing change. Cleanser: Wound Cleanser 1 Robinson Per Day/30 Days Discharge Instructions: Cleanse the wound with wound cleanser prior to applying a clean dressing using gauze sponges, not tissue or cotton balls. Prim Dressing: Santyl Ointment 1 Robinson Per Day/30 Days ary Discharge Instructions: Apply nickel thick amount to wound bed as instructed Secondary Dressing: ABD Pad, 8x10 (DME) (Generic) 1 Robinson Per Day/30 Days Discharge Instructions: Apply over primary dressing as directed. Secondary Dressing: NonWoven Sponge, 4x4 (in/in) (DME) (Generic) 1 Robinson Per Day/30 Days Secured With: Elastic Bandage 4 inch (ACE bandage) 1 Robinson Per Day/30 Days Discharge Instructions: Secure with ACE bandage as directed. Secured With: [employer], 4.5x3.1  (in/yd) 1 Robinson Per Day/30 Days Discharge Instructions: Secure with Kerlix as directed. Secured With: Transpore Surgical Tape, 2x10 (in/yd) 1 Robinson Per Day/30 Days Discharge Instructions: Secure dressing with tape as directed. Wound #3 - Ankle Wound Laterality: Left, Medial Cleanser: Soap and Water 1 Robinson Per Day/30 Days Discharge Instructions: May shower and wash wound with dial antibacterial soap and water prior to dressing change. Cleanser: Wound Cleanser 1 Robinson Per Day/30 Days Discharge Instructions: Cleanse the wound with wound cleanser prior to applying a clean dressing using gauze sponges, not tissue or cotton balls. Prim Dressing: Santyl Ointment ary 1 Robinson Per Day/30 Days Discharge Instructions: Apply nickel thick amount to wound bed as instructed Secondary Dressing: ABD Pad, 8x10 (DME) (Generic) 1 Robinson Per Day/30 Days Discharge Instructions: Apply over primary dressing as directed. Secondary Dressing: NonWoven Sponge, 4x4 (in/in) (DME) (Generic) 1 Robinson Per Day/30 Days Secured With: Elastic Bandage 4 inch (ACE bandage) 1 Robinson Per Day/30 Days Discharge Instructions: Secure with ACE bandage as directed. Secured With: [employer], 4.5x3.1 (in/yd) 1 Robinson Per Day/30 Days Discharge Instructions: Secure with Kerlix as directed. Secured With: Transpore Surgical Tape, 2x10 (in/yd) 1 Robinson Per Day/30 Days Discharge Instructions: Secure dressing with tape as directed. Wound #4 - Ankle Wound Laterality: Left, Lateral Cleanser: Soap and Water 1 Robinson Per Day/30 Days Discharge Instructions: May shower and wash wound with dial antibacterial soap and water prior to dressing change. Cleanser: Wound Cleanser 1 Robinson Per Day/30 Days Discharge Instructions: Cleanse the wound with wound cleanser prior to applying a clean dressing using gauze sponges, not tissue or cotton balls. Prim Dressing: Santyl Ointment 1 Robinson Per Day/30 Days ary Discharge Instructions: Apply nickel thick amount to wound bed as instructed Secondary Dressing: ABD  Pad, 8x10 (DME) (Generic) 1 Robinson Per Day/30 Days Discharge Instructions: Apply over primary dressing as directed. Secondary Dressing: NonWoven Sponge, 4x4 (  in/in) (DME) (Generic) 1 Robinson Per Day/30 Days Secured With: Elastic Bandage 4 inch (ACE bandage) 1 Robinson Per Day/30 Days Discharge Instructions: Secure with ACE bandage as directed. Secured With: [employer], 4.5x3.1 (in/yd) 1 Robinson Per Day/30 Days Discharge Instructions: Secure with Kerlix as directed. Secured With: Transpore Surgical Tape, 2x10 (in/yd) 1 Robinson Per Day/30 Days Discharge Instructions: Secure dressing with tape as directed. Wound #5 - Foot Wound Laterality: Dorsal, Left Cleanser: Soap and Water 1 Robinson Per Day/30 Days Discharge Instructions: May shower and wash wound with dial antibacterial soap and water prior to dressing change. Cleanser: Wound Cleanser 1 Robinson Per Day/30 Days Discharge Instructions: Cleanse the wound with wound cleanser prior to applying a clean dressing using gauze sponges, not tissue or cotton balls. Prim Dressing: Santyl Ointment 1 Robinson Per Day/30 Days ary Discharge Instructions: Apply nickel thick amount to wound bed as instructed Secondary Dressing: ABD Pad, 8x10 (DME) (Generic) 1 Robinson Per Day/30 Days Discharge Instructions: Apply over primary dressing as directed. Secondary Dressing: NonWoven Sponge, 4x4 (in/in) (DME) (Generic) 1 Robinson Per Day/30 Days Secured With: Elastic Bandage 4 inch (ACE bandage) 1 Robinson Per Day/30 Days Discharge Instructions: Secure with ACE bandage as directed. Secured With: [employer], 4.5x3.1 (in/yd) 1 Robinson Per Day/30 Days Discharge Instructions: Secure with Kerlix as directed. Secured With: Transpore Surgical Tape, 2x10 (in/yd) 1 Robinson Per Day/30 Days Discharge Instructions: Secure dressing with tape as directed. Wound #6 - Foot Wound Laterality: Left, Medial, Distal Cleanser: Soap and Water 1 Robinson Per Day/30 Days Discharge Instructions: May shower and wash wound with dial antibacterial soap and  water prior to dressing change. Cleanser: Wound Cleanser 1 Robinson Per Day/30 Days Discharge Instructions: Cleanse the wound with wound cleanser prior to applying a clean dressing using gauze sponges, not tissue or cotton balls. Prim Dressing: Santyl Ointment 1 Robinson Per Day/30 Days ary Discharge Instructions: Apply nickel thick amount to wound bed as instructed Secondary Dressing: ABD Pad, 8x10 (DME) (Generic) 1 Robinson Per Day/30 Days Discharge Instructions: Apply over primary dressing as directed. Secondary Dressing: NonWoven Sponge, 4x4 (in/in) (DME) (Generic) 1 Robinson Per Day/30 Days Secured With: Elastic Bandage 4 inch (ACE bandage) 1 Robinson Per Day/30 Days Discharge Instructions: Secure with ACE bandage as directed. Secured With: [employer], 4.5x3.1 (in/yd) 1 Robinson Per Day/30 Days Discharge Instructions: Secure with Kerlix as directed. Secured With: Transpore Surgical Tape, 2x10 (in/yd) 1 Robinson Per Day/30 Days Discharge Instructions: Secure dressing with tape as directed. Wound #7 - Ankle Wound Laterality: Right, Lateral Cleanser: Soap and Water 1 Robinson Per Day/30 Days Discharge Instructions: May shower and wash wound with dial antibacterial soap and water prior to dressing change. Cleanser: Wound Cleanser 1 Robinson Per Day/30 Days Discharge Instructions: Cleanse the wound with wound cleanser prior to applying a clean dressing using gauze sponges, not tissue or cotton balls. Prim Dressing: Santyl Ointment 1 Robinson Per Day/30 Days ary Discharge Instructions: Apply nickel thick amount to wound bed as instructed Secondary Dressing: ABD Pad, 8x10 (DME) (Generic) 1 Robinson Per Day/30 Days Discharge Instructions: Apply over primary dressing as directed. Secondary Dressing: NonWoven Sponge, 4x4 (in/in) (DME) (Generic) 1 Robinson Per Day/30 Days Secured With: Elastic Bandage 4 inch (ACE bandage) 1 Robinson Per Day/30 Days Discharge Instructions: Secure with ACE bandage as directed. Secured With: [employer], 4.5x3.1 (in/yd) 1 Robinson Per Day/30  Days Discharge Instructions: Secure with Kerlix as directed. Secured With: Transpore Surgical Tape, 2x10 (in/yd) 1 Robinson Per Day/30 Days Discharge Instructions: Secure  dressing with tape as directed. Electronic Signature(s) Signed: 04/19/2021 5:32:36 PM By: Linton Ham MD Signed: 04/19/2021 5:48:05 PM By: Baruch Gouty RN, BSN Entered By: Baruch Gouty on 04/19/2021 14:49:02 -------------------------------------------------------------------------------- Problem List Details Patient Name: Date of Service: Candace Robinson, Candace RO L L. 04/19/2021 1:30 PM Medical Record Number: [id number] Patient Account Number: [id number] Date of Birth/Sex: Treating RN: 1966-01-19 (55 y.o. [name], Meta.Reding Primary Care Provider: [name] Other Clinician: Referring Provider: Treating Provider/Extender: Debbra Riding in Treatment: 8 Active Problems ICD-10 Encounter Code Description Active Date MDM Diagnosis [phone] Non-pressure chronic ulcer of other part of right foot with fat layer exposed 02/22/2021 No Yes L97.522 Non-pressure chronic ulcer of other part of left foot with fat layer exposed 02/22/2021 No Yes I73.9 Peripheral vascular disease, unspecified 02/22/2021 No Yes E11.9 Type 2 diabetes mellitus without complications 1/88/4166 No Yes L93.2 Other local lupus erythematosus 02/22/2021 No Yes A63.01 Chronic diastolic (congestive) heart failure 02/22/2021 No Yes I10 Essential (primary) hypertension 02/22/2021 No Yes E11.621 Type 2 diabetes mellitus with foot ulcer 02/22/2021 No Yes Inactive Problems Resolved Problems Electronic Signature(s) Signed: 04/19/2021 5:32:36 PM By: Linton Ham MD Entered By: Linton Ham on 04/19/2021 15:11:49 -------------------------------------------------------------------------------- Progress Note Details Patient Name: Date of Service: Candace Robinson, Candace RO L L. 04/19/2021 1:30 PM Medical Record Number: [id number] Patient  Account Number: [id number] Date of Birth/Sex: Treating RN: Sep 29, 1965 (55 y.o. Candace Robinson Primary Care Provider: [name] Other Clinician: Referring Provider: Treating Provider/Extender: Debbra Riding in Treatment: 8 Subjective History of Present Illness (HPI) Admission 8/22 Ms. Jawana Reagor is a 55 year old female with a past medical history of diet-controlled type 2 diabetes, hypothyroidism, chronic diastolic heart failure, cutaneous lupus erythematosus and essential hypertension that presents to the clinic for a 20-month history of worsening bilateral feet ulcers. She states that wounds to her feet have been an ongoing issue for several years. She states that with wound care they improve however they tend to wax and wane. 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 She was discharged to a nursing facility and she was provided wound care for her bilateral feet ulcers at that time. She states they used Silvadene and silver alginate. She states that when she was discharged in April from the nursing facility her wounds had improved but were not fully closed. She currently uses Silvadene but has not been using silver alginate for several months due to running out. She reports significant decline in her wound healing over the last 4 months. She reports pain to the wound sites. She currently denies increased warmth erythema or purulent drainage. 8/29; patient presents for 1 week follow-up. She states she had ABIs completed without TBI's. She continues to use silver alginate with dressing changes. She reports continued pain. She denies signs of infection. 9/8; patient presents for 1 week follow-up. She was evaluated by Dr. Donzetta Matters and she reports follow-up with him in 1 month. She has been using Santyl to the wound beds. She reports some improvement in wound healing. She denies signs of infection. 9/15; patient presents for  follow-up. She continues to use Santyl and silver alginate to the wound beds. She states the alginate is sticking and hard to remove with dressing changes. She currently denies signs of infection. 9/22; patient presents for 1 week follow-up. She is using Santyl and silver alginate to the wound beds. She ran out of [place] and has been using Silvadene. She states that gentamicin  ointment is ready at her pharmacy and will be picking this up today. She has no issues or complaints today. She denies signs of infection. 9/30; she is using Santyl and silver alginate. She has substantial wounds on her bilateral feet and bilateral ankles that have been there for a year. She is in a lot of pain. She cannot bear to have these wounds touched let alone a mechanical debridement here. Furthermore the etiology of this is really not totally clear. She had reasonably normal ABIs with triphasic waveforms in August [I did not review this however]. She is completing a course of ciprofloxacin for an area on the left lateral ankle. She has had prior ablations and follows with vascular surgery. Finally she apparently has lupus 10/6; patient has been using Silvadene to the wound beds with dressing changes. She states she is picking up Santyl today. She has no issues or complaints today. She denies signs of infection. 10/17 the patient managed to get Santyl which the she is using to all wound beds. There is a large areas of breakdown here including substantially on the left distal foot, right foot involving the dorsal toes both medial ankles. All of these wounds are covered with some degree of very adherent fibrinous material. The patient did has an appointment with vascular surgery Dr. Donzetta Matters in 2 days time Objective Constitutional Sitting or standing Blood Pressure is within target range for patient.. Pulse regular and within target range for patient.Marland Kitchen Respirations regular, non-labored and within target range.. Temperature is  normal and within the target range for the patient.Marland Kitchen Appears in no distress. Vitals Time Taken: 1:55 PM, Height: 63 in, Weight: 157 lbs, BMI: 27.8, Temperature: 98.3 F, Pulse: 71 bpm, Respiratory Rate: 16 breaths/min, Blood Pressure: 117/78 mmHg. Cardiovascular Pedal pulses are palpable. General Notes: Wound exam; substantial wounds on the left greater than right dorsal foot bilateral ankles. All of these covered and very adherent nonviable surfaces and minimal granulation. There is no evidence of surrounding infection or inflammation. Her edema seems reasonably well controlled. She has some evidence of chronic venous insufficiency Integumentary (Hair, Skin) Wound #1 status is Open. Original cause of wound was Gradually Appeared. The date acquired was: 04/03/2020. The wound has been in treatment 8 weeks. The wound is located on the Right,Medial Ankle. The wound measures 5.4cm length Robinson 4.5cm width Robinson 0.1cm depth; 19.085cm^2 area and 1.909cm^3 volume. There is Fat Layer (Subcutaneous Tissue) exposed. There is no tunneling or undermining noted. There is a medium amount of serosanguineous drainage noted. The wound margin is distinct with the outline attached to the wound base. There is small (1-33%) red granulation within the wound bed. There is a large (67-100%) amount of necrotic tissue within the wound bed including Adherent Slough. Wound #2 status is Open. Original cause of wound was Gradually Appeared. The date acquired was: 04/03/2020. The wound has been in treatment 8 weeks. The wound is located on the Right,Dorsal Foot. The wound measures 6.5cm length Robinson 5.8cm width Robinson 0.1cm depth; 29.61cm^2 area and 2.961cm^3 volume. There is Fat Layer (Subcutaneous Tissue) exposed. There is no tunneling or undermining noted. There is a medium amount of serosanguineous drainage noted. The wound margin is distinct with the outline attached to the wound base. There is small (1-33%) red granulation within the wound  bed. There is a large (67-100%) amount of necrotic tissue within the wound bed including Adherent Slough. Wound #3 status is Open. Original cause of wound was Gradually Appeared. The date acquired was: 02/22/2021.  The wound has been in treatment 8 weeks. The wound is located on the Left,Medial Ankle. The wound measures 1cm length Robinson 2.5cm width Robinson 0.1cm depth; 1.963cm^2 area and 0.196cm^3 volume. There is Fat Layer (Subcutaneous Tissue) exposed. There is no tunneling or undermining noted. There is a medium amount of serosanguineous drainage noted. The wound margin is distinct with the outline attached to the wound base. There is large (67-100%) red granulation within the wound bed. There is a small (1-33%) amount of necrotic tissue within the wound bed including Adherent Slough. Wound #4 status is Open. Original cause of wound was Gradually Appeared. The date acquired was: 04/03/2020. The wound has been in treatment 8 weeks. The wound is located on the Left,Lateral Ankle. The wound measures 8cm length Robinson 5.4cm width Robinson 0.1cm depth; 33.929cm^2 area and 3.393cm^3 volume. There is Fat Layer (Subcutaneous Tissue) exposed. There is no tunneling or undermining noted. There is a medium amount of serosanguineous drainage noted. The wound margin is distinct with the outline attached to the wound base. There is medium (34-66%) red granulation within the wound bed. There is a medium (34- 66%) amount of necrotic tissue within the wound bed including Adherent Slough. Wound #5 status is Open. Original cause of wound was Gradually Appeared. The date acquired was: 04/03/2020. The wound has been in treatment 8 weeks. The wound is located on the Left,Dorsal Foot. The wound measures 10cm length Robinson 9.5cm width Robinson 0.1cm depth; 74.613cm^2 area and 7.461cm^3 volume. There is Fat Layer (Subcutaneous Tissue) exposed. There is no tunneling or undermining noted. There is a medium amount of serosanguineous drainage noted. The wound  margin is distinct with the outline attached to the wound base. There is small (1-33%) red granulation within the wound bed. There is a large (67-100%) amount of necrotic tissue within the wound bed including Adherent Slough. Wound #6 status is Open. Original cause of wound was Gradually Appeared. The date acquired was: 04/03/2020. The wound has been in treatment 8 weeks. The wound is located on the Left,Distal,Medial Foot. The wound measures 0.5cm length Robinson 1cm width Robinson 0.1cm depth; 0.393cm^2 area and 0.039cm^3 volume. There is Fat Layer (Subcutaneous Tissue) exposed. There is no tunneling or undermining noted. There is a medium amount of serosanguineous drainage noted. The wound margin is distinct with the outline attached to the wound base. There is large (67-100%) red, pink granulation within the wound bed. There is a small (1- 33%) amount of necrotic tissue within the wound bed including Adherent Slough. Wound #7 status is Open. Original cause of wound was Gradually Appeared. The date acquired was: 04/08/2021. The wound has been in treatment 1 weeks. The wound is located on the Right,Lateral Ankle. The wound measures 0.7cm length Robinson 0.8cm width Robinson 0.1cm depth; 0.44cm^2 area and 0.044cm^3 volume. There is Fat Layer (Subcutaneous Tissue) exposed. There is no tunneling or undermining noted. There is a medium amount of serosanguineous drainage noted. The wound margin is distinct with the outline attached to the wound base. There is medium (34-66%) red, pink granulation within the wound bed. There is a medium (34-66%) amount of necrotic tissue within the wound bed including Adherent Slough. Assessment Active Problems ICD-10 Non-pressure chronic ulcer of other part of right foot with fat layer exposed Non-pressure chronic ulcer of other part of left foot with fat layer exposed Peripheral vascular disease, unspecified Type 2 diabetes mellitus without complications Other local lupus erythematosus Chronic  diastolic (congestive) heart failure Essential (primary) hypertension Type 2 diabetes mellitus  with foot ulcer Procedures Wound #1 Pre-procedure diagnosis of Wound #1 is a Diabetic Wound/Ulcer of the Lower Extremity located on the Right,Medial Ankle .Severity of Tissue Pre Debridement is: Fat layer exposed. There was a Chemical/Enzymatic/Mechanical debridement performed by Baruch Gouty, RN. after achieving pain control using Lidocaine 5% topical ointment. Agent used was [employer]. There was no bleeding. The procedure was tolerated well. Post Debridement Measurements: 5.4cm length Robinson 4.5cm width Robinson 0.1cm depth; 1.909cm^3 volume. Character of Wound/Ulcer Post Debridement requires further debridement. Severity of Tissue Post Debridement is: Fat layer exposed. Post procedure Diagnosis Wound #1: Same as Pre-Procedure Wound #2 Pre-procedure diagnosis of Wound #2 is a Diabetic Wound/Ulcer of the Lower Extremity located on the Right,Dorsal Foot .Severity of Tissue Pre Debridement is: Fat layer exposed. There was a Chemical/Enzymatic/Mechanical debridement performed by Baruch Gouty, RN. after achieving pain control using Lidocaine 5% topical ointment. Agent used was [employer]. There was no bleeding. The procedure was tolerated well. Post Debridement Measurements: 6.5cm length Robinson 58cm width Robinson 0.1cm depth; 29.61cm^3 volume. Character of Wound/Ulcer Post Debridement requires further debridement. Severity of Tissue Post Debridement is: Fat layer exposed. Post procedure Diagnosis Wound #2: Same as Pre-Procedure Wound #3 Pre-procedure diagnosis of Wound #3 is a Diabetic Wound/Ulcer of the Lower Extremity located on the Left,Medial Ankle .Severity of Tissue Pre Debridement is: Fat layer exposed. There was a Chemical/Enzymatic/Mechanical debridement performed by Baruch Gouty, RN. after achieving pain control using Lidocaine 5% topical ointment. Agent used was [employer]. There was no bleeding. The procedure was  tolerated well. Post Debridement Measurements: 1cm length Robinson 2.5cm width Robinson 0.1cm depth; 0.196cm^3 volume. Character of Wound/Ulcer Post Debridement requires further debridement. Severity of Tissue Post Debridement is: Fat layer exposed. Post procedure Diagnosis Wound #3: Same as Pre-Procedure Wound #4 Pre-procedure diagnosis of Wound #4 is a Diabetic Wound/Ulcer of the Lower Extremity located on the Left,Lateral Ankle .Severity of Tissue Pre Debridement is: Fat layer exposed. There was a Chemical/Enzymatic/Mechanical debridement performed by Baruch Gouty, RN. after achieving pain control using Lidocaine 5% topical ointment. Agent used was [employer]. There was no bleeding. The procedure was tolerated well. Post Debridement Measurements: 8cm length Robinson 5.4cm width Robinson 0.1cm depth; 3.393cm^3 volume. Character of Wound/Ulcer Post Debridement requires further debridement. Severity of Tissue Post Debridement is: Fat layer exposed. Post procedure Diagnosis Wound #4: Same as Pre-Procedure Wound #5 Pre-procedure diagnosis of Wound #5 is a Diabetic Wound/Ulcer of the Lower Extremity located on the Left,Dorsal Foot .Severity of Tissue Pre Debridement is: Fat layer exposed. There was a Chemical/Enzymatic/Mechanical debridement performed by Baruch Gouty, RN. after achieving pain control using Lidocaine 5% topical ointment. Agent used was [employer]. There was no bleeding. The procedure was tolerated well. Post Debridement Measurements: 10cm length Robinson 9.5cm width Robinson 0.1cm depth; 7.461cm^3 volume. Character of Wound/Ulcer Post Debridement requires further debridement. Severity of Tissue Post Debridement is: Fat layer exposed. Post procedure Diagnosis Wound #5: Same as Pre-Procedure Wound #6 Pre-procedure diagnosis of Wound #6 is a Diabetic Wound/Ulcer of the Lower Extremity located on the Left,Distal,Medial Foot .Severity of Tissue Pre Debridement is: Fat layer exposed. There was a Chemical/Enzymatic/Mechanical  debridement performed by Baruch Gouty, RN. after achieving pain control using Lidocaine 5% topical ointment. Agent used was [employer]. There was no bleeding. The procedure was tolerated well. Post Debridement Measurements: 0.5cm length Robinson 1cm width Robinson 0.1cm depth; 0.039cm^3 volume. Character of Wound/Ulcer Post Debridement requires further debridement. Severity of Tissue Post Debridement is: Fat layer exposed. Post procedure Diagnosis Wound #6: Same as Pre-Procedure  Wound #7 Pre-procedure diagnosis of Wound #7 is a Lupus located on the Right,Lateral Ankle . There was a Chemical/Enzymatic/Mechanical debridement performed by Baruch Gouty, RN. after achieving pain control using Lidocaine 5% topical ointment. Agent used was [employer]. There was no bleeding. The procedure was tolerated well. Post Debridement Measurements: 0.7cm length Robinson 0.8cm width Robinson 0.1cm depth; 0.044cm^3 volume. Character of Wound/Ulcer Post Debridement requires further debridement. Post procedure Diagnosis Wound #7: Same as Pre-Procedure Plan Follow-up Appointments: Return Appointment in 1 week. - any day any doctor ***EXTRA TIME 75 MINUTES*** Other: - [facility]=Supplies Bathing/ Shower/ Hygiene: May shower and wash wound with soap and water. - when dressings changed Edema Control - Lymphedema / SCD / Other: Elevate legs to the level of the heart or above for 30 minutes daily and/or when sitting, a frequency of: - throughout the day Avoid standing for long periods of time. Additional Orders / Instructions: Follow Nutritious Diet - 100-120g of protein daily WOUND #1: - Ankle Wound Laterality: Right, Medial Cleanser: Soap and Water 1 Robinson Per Day/30 Days Discharge Instructions: May shower and wash wound with dial antibacterial soap and water prior to dressing change. Cleanser: Wound Cleanser 1 Robinson Per Day/30 Days Discharge Instructions: Cleanse the wound with wound cleanser prior to applying a clean dressing using gauze sponges,  not tissue or cotton balls. Prim Dressing: Santyl Ointment 1 Robinson Per Day/30 Days ary Discharge Instructions: Apply nickel thick amount to wound bed as instructed Secondary Dressing: ABD Pad, 8x10 (DME) (Generic) 1 Robinson Per Day/30 Days Discharge Instructions: Apply over primary dressing as directed. Secondary Dressing: NonWoven Sponge, 4x4 (in/in) (DME) (Generic) 1 Robinson Per Day/30 Days Secured With: Elastic Bandage 4 inch (ACE bandage) 1 Robinson Per Day/30 Days Discharge Instructions: Secure with ACE bandage as directed. Secured With: [employer], 4.5x3.1 (in/yd) 1 Robinson Per Day/30 Days Discharge Instructions: Secure with Kerlix as directed. Secured With: Trans[employer]ore Surgical T ape, 2x10 (in/yd) 1 Robinson Per Day/30 Days Discharge Instructions: Secure dressing with tape as directed. WOUND #2: - Foot Wound Laterality: Dorsal, Right Cleanser: Soap and Water 1 Robinson Per Day/30 Days Discharge Instructions: May shower and wash wound with dial antibacterial soap and water prior to dressing change. Cleanser: Wound Cleanser 1 Robinson Per Day/30 Days Discharge Instructions: Cleanse the wound with wound cleanser prior to applying a clean dressing using gauze sponges, not tissue or cotton balls. Prim Dressing: Santyl Ointment 1 Robinson Per Day/30 Days ary Discharge Instructions: Apply nickel thick amount to wound bed as instructed Secondary Dressing: ABD Pad, 8x10 (DME) (Generic) 1 Robinson Per Day/30 Days Discharge Instructions: Apply over primary dressing as directed. Secondary Dressing: NonWoven Sponge, 4x4 (in/in) (DME) (Generic) 1 Robinson Per Day/30 Days Secured With: Elastic Bandage 4 inch (ACE bandage) 1 Robinson Per Day/30 Days Discharge Instructions: Secure with ACE bandage as directed. Secured With: [employer], 4.5x3.1 (in/yd) 1 Robinson Per Day/30 Days Discharge Instructions: Secure with Kerlix as directed. Secured With: [employer] T ape, 2x10 (in/yd) 1 Robinson Per Day/30 Days Discharge Instructions: Secure dressing with tape as  directed. WOUND #3: - Ankle Wound Laterality: Left, Medial Cleanser: Soap and Water 1 Robinson Per Day/30 Days Discharge Instructions: May shower and wash wound with dial antibacterial soap and water prior to dressing change. Cleanser: Wound Cleanser 1 Robinson Per Day/30 Days Discharge Instructions: Cleanse the wound with wound cleanser prior to applying a clean dressing using gauze sponges, not tissue or cotton balls. Prim Dressing: Santyl Ointment 1 Robinson Per Day/30 Days ary Discharge  Instructions: Apply nickel thick amount to wound bed as instructed Secondary Dressing: ABD Pad, 8x10 (DME) (Generic) 1 Robinson Per Day/30 Days Discharge Instructions: Apply over primary dressing as directed. Secondary Dressing: NonWoven Sponge, 4x4 (in/in) (DME) (Generic) 1 Robinson Per Day/30 Days Secured With: Elastic Bandage 4 inch (ACE bandage) 1 Robinson Per Day/30 Days Discharge Instructions: Secure with ACE bandage as directed. Secured With: [employer], 4.5x3.1 (in/yd) 1 Robinson Per Day/30 Days Discharge Instructions: Secure with Kerlix as directed. Secured With: [employer] T ape, 2x10 (in/yd) 1 Robinson Per Day/30 Days Discharge Instructions: Secure dressing with tape as directed. WOUND #4: - Ankle Wound Laterality: Left, Lateral Cleanser: Soap and Water 1 Robinson Per Day/30 Days Discharge Instructions: May shower and wash wound with dial antibacterial soap and water prior to dressing change. Cleanser: Wound Cleanser 1 Robinson Per Day/30 Days Discharge Instructions: Cleanse the wound with wound cleanser prior to applying a clean dressing using gauze sponges, not tissue or cotton balls. Prim Dressing: Santyl Ointment 1 Robinson Per Day/30 Days ary Discharge Instructions: Apply nickel thick amount to wound bed as instructed Secondary Dressing: ABD Pad, 8x10 (DME) (Generic) 1 Robinson Per Day/30 Days Discharge Instructions: Apply over primary dressing as directed. Secondary Dressing: NonWoven Sponge, 4x4 (in/in) (DME) (Generic) 1 Robinson Per Day/30 Days Secured  With: Elastic Bandage 4 inch (ACE bandage) 1 Robinson Per Day/30 Days Discharge Instructions: Secure with ACE bandage as directed. Secured With: [employer], 4.5x3.1 (in/yd) 1 Robinson Per Day/30 Days Discharge Instructions: Secure with Kerlix as directed. Secured With: [employer] T ape, 2x10 (in/yd) 1 Robinson Per Day/30 Days Discharge Instructions: Secure dressing with tape as directed. WOUND #5: - Foot Wound Laterality: Dorsal, Left Cleanser: Soap and Water 1 Robinson Per Day/30 Days Discharge Instructions: May shower and wash wound with dial antibacterial soap and water prior to dressing change. Cleanser: Wound Cleanser 1 Robinson Per Day/30 Days Discharge Instructions: Cleanse the wound with wound cleanser prior to applying a clean dressing using gauze sponges, not tissue or cotton balls. Prim Dressing: Santyl Ointment 1 Robinson Per Day/30 Days ary Discharge Instructions: Apply nickel thick amount to wound bed as instructed Secondary Dressing: ABD Pad, 8x10 (DME) (Generic) 1 Robinson Per Day/30 Days Discharge Instructions: Apply over primary dressing as directed. Secondary Dressing: NonWoven Sponge, 4x4 (in/in) (DME) (Generic) 1 Robinson Per Day/30 Days Secured With: Elastic Bandage 4 inch (ACE bandage) 1 Robinson Per Day/30 Days Discharge Instructions: Secure with ACE bandage as directed. Secured With: [employer], 4.5x3.1 (in/yd) 1 Robinson Per Day/30 Days Discharge Instructions: Secure with Kerlix as directed. Secured With: [employer] T ape, 2x10 (in/yd) 1 Robinson Per Day/30 Days Discharge Instructions: Secure dressing with tape as directed. WOUND #6: - Foot Wound Laterality: Left, Medial, Distal Cleanser: Soap and Water 1 Robinson Per Day/30 Days Discharge Instructions: May shower and wash wound with dial antibacterial soap and water prior to dressing change. Cleanser: Wound Cleanser 1 Robinson Per Day/30 Days Discharge Instructions: Cleanse the wound with wound cleanser prior to applying a clean dressing using gauze sponges, not tissue  or cotton balls. Prim Dressing: Santyl Ointment 1 Robinson Per Day/30 Days ary Discharge Instructions: Apply nickel thick amount to wound bed as instructed Secondary Dressing: ABD Pad, 8x10 (DME) (Generic) 1 Robinson Per Day/30 Days Discharge Instructions: Apply over primary dressing as directed. Secondary Dressing: NonWoven Sponge, 4x4 (in/in) (DME) (Generic) 1 Robinson Per Day/30 Days Secured With: Elastic Bandage 4 inch (ACE bandage) 1 Robinson Per Day/30 Days Discharge Instructions: Secure with  ACE bandage as directed. Secured With: [employer], 4.5x3.1 (in/yd) 1 Robinson Per Day/30 Days Discharge Instructions: Secure with Kerlix as directed. Secured With: [employer] T ape, 2x10 (in/yd) 1 Robinson Per Day/30 Days Discharge Instructions: Secure dressing with tape as directed. WOUND #7: - Ankle Wound Laterality: Right, Lateral Cleanser: Soap and Water 1 Robinson Per Day/30 Days Discharge Instructions: May shower and wash wound with dial antibacterial soap and water prior to dressing change. Cleanser: Wound Cleanser 1 Robinson Per Day/30 Days Discharge Instructions: Cleanse the wound with wound cleanser prior to applying a clean dressing using gauze sponges, not tissue or cotton balls. Prim Dressing: Santyl Ointment 1 Robinson Per Day/30 Days ary Discharge Instructions: Apply nickel thick amount to wound bed as instructed Secondary Dressing: ABD Pad, 8x10 (DME) (Generic) 1 Robinson Per Day/30 Days Discharge Instructions: Apply over primary dressing as directed. Secondary Dressing: NonWoven Sponge, 4x4 (in/in) (DME) (Generic) 1 Robinson Per Day/30 Days Secured With: Elastic Bandage 4 inch (ACE bandage) 1 Robinson Per Day/30 Days Discharge Instructions: Secure with ACE bandage as directed. Secured With: [employer], 4.5x3.1 (in/yd) 1 Robinson Per Day/30 Days Discharge Instructions: Secure with Kerlix as directed. Secured With: [employer] T ape, 2x10 (in/yd) 1 Robinson Per Day/30 Days Discharge Instructions: Secure dressing with tape as directed. 1.  The patient has venous reflux studies on Wednesday 2. She says that she has had these wounds on and off for years and is always been told that these are venous wounds 3. The surface on these wounds are completely nonviable but she is really in too much pain for any ongoing debridement 4. I agree with Santyl. The patient was covering this with silver alginate's but said that this adhered to the wounds to the tightly so she has not been using it. Electronic Signature(s) Signed: 04/19/2021 5:32:36 PM By: Linton Ham MD Entered By: Linton Ham on 04/19/2021 15:19:21 -------------------------------------------------------------------------------- SuperBill Details Patient Name: Date of Service: Candace Robinson, Candace RO L L. 04/19/2021 Medical Record Number: [id number] Patient Account Number: [id number] Date of Birth/Sex: Treating RN: [birth year] (55 y.o. Candace Robinson Primary Care Provider: [name] Other Clinician: Referring Provider: Treating Provider/Extender: Debbra Riding in Treatment: 8 Diagnosis Coding ICD-10 Codes Code Description [id number] Non-pressure chronic ulcer of other part of right foot with fat layer exposed L97.522 Non-pressure chronic ulcer of other part of left foot with fat layer exposed I73.9 Peripheral vascular disease, unspecified E11.9 Type 2 diabetes mellitus without complications H37.1 Other local lupus erythematosus I96.78 Chronic diastolic (congestive) heart failure I10 Essential (primary) hypertension E11.621 Type 2 diabetes mellitus with foot ulcer Facility Procedures CPT4 Code: 93810175 Description: 10258 - DEBRIDE W/O ANES NON SELECT Modifier: Quantity: 1 Physician Procedures Electronic Signature(s) Signed: 04/19/2021 5:32:36 PM By: Linton Ham MD Entered By: Linton Ham on 04/19/2021 15:19:55

## 2021-04-21 ENCOUNTER — Ambulatory Visit (HOSPITAL_COMMUNITY)
Admission: RE | Admit: 2021-04-21 | Discharge: 2021-04-21 | Disposition: A | Discharge status: Home / Self Care | Payer: Medicaid Other | Source: Ambulatory Visit | Attending: Vascular Surgery | Admitting: Vascular Surgery

## 2021-04-21 ENCOUNTER — Other Ambulatory Visit: Payer: Self-pay

## 2021-04-21 ENCOUNTER — Ambulatory Visit (INDEPENDENT_AMBULATORY_CARE_PROVIDER_SITE_OTHER): Payer: Medicaid Other | Admitting: Vascular Surgery

## 2021-04-21 ENCOUNTER — Encounter: Payer: Self-pay | Admitting: Vascular Surgery

## 2021-04-21 VITALS — BP 98/62 | HR 68 | Temp 98.0°F | Resp 20 | Ht 63.0 in | Wt 175.0 lb

## 2021-04-21 DIAGNOSIS — S81801A Unspecified open wound, right lower leg, initial encounter: Secondary | ICD-10-CM | POA: Insufficient documentation

## 2021-04-21 DIAGNOSIS — S81802A Unspecified open wound, left lower leg, initial encounter: Secondary | ICD-10-CM | POA: Diagnosis present

## 2021-04-21 NOTE — Progress Notes (Signed)
Patient ID: Candace Robinson, female   DOB: 11-21-1965, 55 y.o.   MRN: [id number]  Reason for Consult: Follow-up   Referred by [name], MD  Subjective:     HPI:  Candace Robinson is a 55 y.o. female recently evaluated for bilateral lower extremity wounds.  Pictures from the day are included in previous office visit.  She had normal arterial noninvasive studies.  She now returns for venous noninvasive studies.  She does have known previous right lower extremity saphenous vein ablation.  She continues to follow-up in the wound care center.  She has known cutaneous lupus.  She is planning to see rheumatology and dermatology.  Currently she is using Santyl on her wounds.  She denies any fever chills or signs of infection.  Past Medical History:  Diagnosis Date   Anemia    Atrial fibrillation (HCC)    CHF (congestive heart failure) ([place])    Diabetes mellitus without complication (HCC)    Heart murmur    Hypertension    Lupus (HCC)    Thyroid disease    hyperthyroid   Family History  Problem Relation Age of Onset   Hypertension Mother    History reviewed. No pertinent surgical history.  Short Social History:  Social History   Tobacco Use   Smoking status: Never   Smokeless tobacco: Never  Substance Use Topics   Alcohol use: Not Currently    Allergies  Allergen Reactions   Motrin [Ibuprofen] Other (See Comments)    hallucinations    Current Outpatient Medications  Medication Sig Dispense Refill   acetaminophen (TYLENOL) 325 MG tablet Take 2 tablets (650 mg total) by mouth every 6 (six) hours. 30 tablet 0   amLODipine (NORVASC) 10 MG tablet Take 10 mg by mouth daily.     ASPIRIN LOW DOSE 81 MG EC tablet Take 81 mg by mouth daily.     atorvastatin (LIPITOR) 20 MG tablet Take 20 mg by mouth daily.     ferrous sulfate 325 (65 FE) MG tablet Take 1 tablet (325 mg total) by mouth in the morning and at bedtime. 60 tablet 0   hydrOXYzine (ATARAX/VISTARIL) 25 MG  tablet Take 25 mg by mouth every 8 (eight) hours as needed.     HYSEPT 0.25 % SOLN Apply topically 2 (two) times daily.     levothyroxine (SYNTHROID) 50 MCG tablet Take 1 tablet (50 mcg total) by mouth daily. 90 tablet 1   lisinopril (ZESTRIL) 10 MG tablet Take 10 mg by mouth daily.     Multiple Vitamins-Minerals (SENTRY SENIOR/LUTEIN PO) Take 1 tablet by mouth daily in the afternoon.     NON FORMULARY Diet Regular     omeprazole (PRILOSEC) 20 MG capsule Take 20 mg by mouth 2 (two) times daily.     omeprazole (PRILOSEC) 40 MG capsule Take 1 capsule (40 mg total) by mouth daily. 30 capsule 0   ondansetron (ZOFRAN) 4 MG tablet Take 4 mg by mouth as needed.     SANTYL ointment Apply 1 application topically daily.     silver sulfADIAZINE (SILVADENE) 1 % cream Apply topically daily.     tacrolimus (PROTOPIC) 0.1 % ointment Apply topically as needed.     traMADol HCl 100 MG TABS Take 1 tablet by mouth as needed.     zinc gluconate 50 MG tablet Take 50 mg by mouth daily.     No current facility-administered medications for this visit.    Review of Systems  Constitutional:  Constitutional negative. HENT: HENT negative.  Eyes: Eyes negative.  Cardiovascular: Cardiovascular negative.  GI: Gastrointestinal negative.  Skin: Positive for wound.  Neurological: Neurological negative. Hematologic: Hematologic/lymphatic negative.  Psychiatric: Psychiatric negative.       Objective:  Objective   Vitals:   04/21/21 1357  BP: 98/62  Pulse: 68  Resp: 20  Temp: 98 F (36.7 C)  SpO2: 99%    Physical Exam HENT:     Head: Normocephalic.     Nose:     Comments: Wearing a mask Eyes:     Pupils: Pupils are equal, round, and reactive to light.  Pulmonary:     Effort: Pulmonary effort is normal.  Abdominal:     General: Abdomen is flat.  Musculoskeletal:     Cervical back: Normal range of motion and neck supple.     Right lower leg: No edema.     Left lower leg: No edema.  Skin:     Comments: Bilateral lower extremities with dressings in place  Neurological:     General: No focal deficit present.     Mental Status: She is alert.  Psychiatric:        Mood and Affect: Mood normal.        Behavior: Behavior normal.        Thought Content: Thought content normal.        Judgment: Judgment normal.    Data: Venous Reflux Times  +--------------+---------+------+-----------+------------+--------------+  RIGHT         Reflux NoRefluxReflux TimeDiameter cmsComments                                Yes                                         +--------------+---------+------+-----------+------------+--------------+  CFV                     yes   >1 second                             +--------------+---------+------+-----------+------------+--------------+  FV mid        no                                                    +--------------+---------+------+-----------+------------+--------------+  Popliteal     no                                                    +--------------+---------+------+-----------+------------+--------------+  GSV at SFJ              yes    >500 ms      0.60                    +--------------+---------+------+-----------+------------+--------------+  GSV prox thigh          yes    >500 ms      0.42                    +--------------+---------+------+-----------+------------+--------------+  GSV mid thigh           yes    >500 ms      0.35    out of fascia   +--------------+---------+------+-----------+------------+--------------+  GSV dist thigh                                      not visualized  +--------------+---------+------+-----------+------------+--------------+  GSV at knee                                         not visualized  +--------------+---------+------+-----------+------------+--------------+  GSV prox calf                                       not  visualized  +--------------+---------+------+-----------+------------+--------------+  SSV Pop Fossa no                            0.16                    +--------------+---------+------+-----------+------------+--------------+  SSV prox calf no                            0.14                    +--------------+---------+------+-----------+------------+--------------+  SSV mid calf  no                            0.14                    +--------------+---------+------+-----------+------------+--------------+      +--------------+---------+------+-----------+------------+--------+  LEFT          Reflux NoRefluxReflux TimeDiameter cmsComments                          Yes                                   +--------------+---------+------+-----------+------------+--------+  CFV                     yes   >1 second                       +--------------+---------+------+-----------+------------+--------+  FV mid        no                                              +--------------+---------+------+-----------+------------+--------+  Popliteal               yes   >1 second                       +--------------+---------+------+-----------+------------+--------+  GSV at SFJ              yes    >500 ms      0.75              +--------------+---------+------+-----------+------------+--------+  GSV prox thigh          yes    >500 ms      0.63              +--------------+---------+------+-----------+------------+--------+  GSV mid thigh no                            0.44              +--------------+---------+------+-----------+------------+--------+  GSV dist thighno                            0.42              +--------------+---------+------+-----------+------------+--------+  GSV at knee             yes    >500 ms      0.45              +--------------+---------+------+-----------+------------+--------+   GSV prox calf           yes    >500 ms      0.40              +--------------+---------+------+-----------+------------+--------+  SSV Pop Fossa no                            0.19              +--------------+---------+------+-----------+------------+--------+  SSV prox calf no                            0.19              +--------------+---------+------+-----------+------------+--------+  SSV mid calf  no                            0.15              +--------------+---------+------+-----------+------------+--------+           Summary:  Bilateral:  - No evidence of deep vein thrombosis seen in the lower extremities,  bilaterally, from the common femoral through the popliteal veins.     Right:  - Venous reflux is noted in the right common femoral vein.  - Venous reflux is noted in the right sapheno-femoral junction.  - Venous reflux is noted in the right greater saphenous vein in the thigh.     Left:  - Venous reflux is noted in the left common femoral vein.  - Venous reflux is noted in the left sapheno-femoral junction.  - Venous reflux is noted in the left greater saphenous vein in the thigh.  - Venous reflux is noted in the left greater saphenous vein in the calf.  - Venous reflux is noted in the left popliteal vein.         Assessment/Plan:     55 year old female with history of cutaneous lupus.  She has normal arterial and venous studies.  As such there is no role for vascular invention.  She can follow-up with me on an as-needed basis.    Waynetta Sandy MD [facility]

## 2021-04-26 ENCOUNTER — Other Ambulatory Visit: Payer: Self-pay

## 2021-04-26 ENCOUNTER — Encounter (HOSPITAL_BASED_OUTPATIENT_CLINIC_OR_DEPARTMENT_OTHER): Payer: Medicaid Other | Admitting: Internal Medicine

## 2021-04-26 DIAGNOSIS — E119 Type 2 diabetes mellitus without complications: Secondary | ICD-10-CM | POA: Diagnosis not present

## 2021-04-26 DIAGNOSIS — L932 Other local lupus erythematosus: Secondary | ICD-10-CM | POA: Diagnosis not present

## 2021-04-26 DIAGNOSIS — L97522 Non-pressure chronic ulcer of other part of left foot with fat layer exposed: Secondary | ICD-10-CM

## 2021-04-26 DIAGNOSIS — E11621 Type 2 diabetes mellitus with foot ulcer: Secondary | ICD-10-CM | POA: Diagnosis not present

## 2021-04-26 NOTE — Progress Notes (Signed)
Candace, Robinson ([id number]) Visit Report for 04/26/2021 Chief Complaint Document Details Patient Name: Date of Service: Candace Robinson, [name] 04/26/2021 3:15 PM Medical Record Number: [id number] Patient Account Number: [id number] Date of Birth/Sex: Treating RN: April 03, 1966 (55 y.o. Candace Robinson Primary Care Provider: [name] Other Clinician: Referring Provider: Treating Provider/Extender: Darcey Nora in Treatment: 9 Information Obtained from: Patient Chief Complaint Bilateral feet wounds Electronic Signature(s) Signed: 04/26/2021 4:20:48 PM By: Kalman Shan DO Entered By: Kalman Shan on 04/26/2021 16:15:59 -------------------------------------------------------------------------------- Debridement Details Patient Name: Date of Service: Candace Robinson, [name] 04/26/2021 3:15 PM Medical Record Number: [id number] Patient Account Number: [id number] Date of Birth/Sex: Treating RN: [birth year] (55 y.o. Candace Robinson Primary Care Provider: [name] Other Clinician: Referring Provider: Treating Provider/Extender: Darcey Nora in Treatment: 9 Debridement Performed for Assessment: Wound #5 Left,Dorsal Foot Performed By: Physician Kalman Shan, DO Debridement Type: Debridement Severity of Tissue Pre Debridement: Fat layer exposed Level of Consciousness (Pre-procedure): Awake and Alert Pre-procedure Verification/Time Out Yes - 15:58 Taken: Start Time: 15:58 T Area Debrided (L x W): otal 4 (cm) x 4 (cm) = 16 (cm) Tissue and other material debrided: Non-Viable, Slough, Slough Level: Non-Viable Tissue Debridement Description: Selective/Open Wound Instrument: Curette Bleeding: None End Time: 16:00 Procedural Pain: 0 Post Procedural Pain: 0 Response to Treatment: Procedure was tolerated well Level of Consciousness (Post- Awake and Alert procedure): Post Debridement Measurements of  Total Wound Length: (cm) 10 Width: (cm) 9.5 Depth: (cm) 0.2 Volume: (cm) 14.923 Character of Wound/Ulcer Post Debridement: Requires Further Debridement Severity of Tissue Post Debridement: Fat layer exposed Post Procedure Diagnosis Same as Pre-procedure Electronic Signature(s) Signed: 04/26/2021 4:20:48 PM By: Kalman Shan DO Signed: 04/26/2021 5:18:13 PM By: Levan Hurst RN, BSN Entered By: Levan Hurst on 04/26/2021 16:00:47 -------------------------------------------------------------------------------- Debridement Details Patient Name: Date of Service: Candace Robinson, [name] 04/26/2021 3:15 PM Medical Record Number: [id number] Patient Account Number: [id number] Date of Birth/Sex: Treating RN: 1965/11/01 (55 y.o. Candace Robinson Primary Care Provider: [name] Other Clinician: Referring Provider: Treating Provider/Extender: Darcey Nora in Treatment: 9 Debridement Performed for Assessment: Wound #1 Right,Medial Ankle Performed By: Physician Kalman Shan, DO Debridement Type: Debridement Severity of Tissue Pre Debridement: Fat layer exposed Level of Consciousness (Pre-procedure): Awake and Alert Pre-procedure Verification/Time Out Yes - 15:58 Taken: Start Time: 15:58 T Area Debrided (L x W): otal 3 (cm) x 3 (cm) = 9 (cm) Tissue and other material debrided: Non-Viable, Slough, Slough Level: Non-Viable Tissue Debridement Description: Selective/Open Wound Instrument: Curette Bleeding: None End Time: 16:00 Procedural Pain: 0 Post Procedural Pain: 0 Response to Treatment: Procedure was tolerated well Level of Consciousness (Post- Awake and Alert procedure): Post Debridement Measurements of Total Wound Length: (cm) 5.6 Width: (cm) 4.4 Depth: (cm) 0.3 Volume: (cm) 5.806 Character of Wound/Ulcer Post Debridement: Requires Further Debridement Severity of Tissue Post Debridement: Fat layer exposed Post Procedure  Diagnosis Same as Pre-procedure Electronic Signature(s) Signed: 04/26/2021 4:20:48 PM By: Kalman Shan DO Signed: 04/26/2021 5:18:13 PM By: Levan Hurst RN, BSN Entered By: Levan Hurst on 04/26/2021 16:01:35 -------------------------------------------------------------------------------- HPI Details Patient Name: Date of Service: Candace Robinson, [name] 04/26/2021 3:15 PM Medical Record Number: [id number] Patient Account Number: [id number] Date of Birth/Sex: Treating RN: [birth year] (55 y.o. Candace Robinson Primary Care Provider: [name] Other Clinician: Referring Provider: Treating Provider/Extender: [name] Weeks in Treatment: 9 History  of Present Illness HPI Description: Admission 8/22 Candace Robinson is a 55 year old female with a past medical history of diet-controlled type 2 diabetes, hypothyroidism, chronic diastolic heart failure, cutaneous lupus erythematosus and essential hypertension that presents to the clinic for a 5-month history of worsening bilateral feet ulcers. She states that wounds to her feet have been an ongoing issue for several years. She states that with wound care they improve however they tend to wax and wane. 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 She was discharged to a nursing facility and she was provided wound care for her bilateral feet ulcers at that time. She states they used Silvadene and silver alginate. She states that when she was discharged in April from the nursing facility her wounds had improved but were not fully closed. She currently uses Silvadene but has not been using silver alginate for several months due to running out. She reports significant decline in her wound healing over the last 4 months. She reports pain to the wound sites. She currently denies increased warmth erythema or purulent drainage. 8/29; patient presents for 1 week follow-up. She states  she had ABIs completed without TBI's. She continues to use silver alginate with dressing changes. She reports continued pain. She denies signs of infection. 9/8; patient presents for 1 week follow-up. She was evaluated by Dr. Donzetta Matters and she reports follow-up with him in 1 month. She has been using Santyl to the wound beds. She reports some improvement in wound healing. She denies signs of infection. 9/15; patient presents for follow-up. She continues to use Santyl and silver alginate to the wound beds. She states the alginate is sticking and hard to remove with dressing changes. She currently denies signs of infection. 9/22; patient presents for 1 week follow-up. She is using Santyl and silver alginate to the wound beds. She ran out of [place] and has been using Silvadene. She states that gentamicin ointment is ready at her pharmacy and will be picking this up today. She has no issues or complaints today. She denies signs of infection. 9/30; she is using Santyl and silver alginate. She has substantial wounds on her bilateral feet and bilateral ankles that have been there for a year. She is in a lot of pain. She cannot bear to have these wounds touched let alone a mechanical debridement here. Furthermore the etiology of this is really not totally clear. She had reasonably normal ABIs with triphasic waveforms in August [I did not review this however]. She is completing a course of ciprofloxacin for an area on the left lateral ankle. She has had prior ablations and follows with vascular surgery. Finally she apparently has lupus 10/6; patient has been using Silvadene to the wound beds with dressing changes. She states she is picking up Santyl today. She has no issues or complaints today. She denies signs of infection. 10/17 the patient managed to get Santyl which the she is using to all wound beds. There is a large areas of breakdown here including substantially on the left distal foot, right foot involving  the dorsal toes both medial ankles. All of these wounds are covered with some degree of very adherent fibrinous material. The patient did has an appointment with vascular surgery Dr. Donzetta Matters in 2 days time 10/24; patient presents for follow-up. She has been using Santyl and silver alginate to the wound beds. She states that she is going to be set up with rheumatology soon. She denies signs of infection. She  overall reports stability in her wound healing. Electronic Signature(s) Signed: 04/26/2021 4:20:48 PM By: Kalman Shan DO Entered By: Kalman Shan on 04/26/2021 16:16:50 -------------------------------------------------------------------------------- Physical Exam Details Patient Name: Date of Service: Candace A DNA X, [name] 04/26/2021 3:15 PM Medical Record Number: [id number] Patient Account Number: [id number] Date of Birth/Sex: Treating RN: 1966-03-04 (55 y.o. Candace Robinson Primary Care Provider: [name] Other Clinician: Referring Provider: Treating Provider/Extender: [name] Weeks in Treatment: 9 Constitutional respirations regular, non-labored and within target range for patient.. Cardiovascular 2+ dorsalis pedis/posterior tibialis pulses. Psychiatric pleasant and cooperative. Notes Wounds throughout feet bilaterally with mostly nonviable tissue and scant granulation tissue present. No evidence of surrounding tissue infection. Electronic Signature(s) Signed: 04/26/2021 4:20:48 PM By: Kalman Shan DO Entered By: Kalman Shan on 04/26/2021 16:17:46 -------------------------------------------------------------------------------- Physician Orders Details Patient Name: Date of Service: Candace Robinson, [name] 04/26/2021 3:15 PM Medical Record Number: [id number] Patient Account Number: [id number] Date of Birth/Sex: Treating RN: 12/14/1965 (56 y.o. Candace Robinson Primary Care Provider: [name] Other  Clinician: Referring Provider: Treating Provider/Extender: Darcey Nora in Treatment: 9 Verbal / Phone Orders: No Diagnosis Coding ICD-10 Coding Code Description [phone] Non-pressure chronic ulcer of other part of right foot with fat layer exposed L97.522 Non-pressure chronic ulcer of other part of left foot with fat layer exposed I73.9 Peripheral vascular disease, unspecified E11.9 Type 2 diabetes mellitus without complications T02.4 Other local lupus erythematosus O97.35 Chronic diastolic (congestive) heart failure I10 Essential (primary) hypertension E11.621 Type 2 diabetes mellitus with foot ulcer Follow-up Appointments ppointment in 2 weeks. - with Dr. He[place] *****EXTRA TIME - 9 MINUTES***** Return A Other: - [facility]=Supplies Bathing/ Shower/ Hygiene May shower and wash wound with soap and water. - when dressings changed Edema Control - Lymphedema / SCD / Other Elevate legs to the level of the heart or above for 30 minutes daily and/or when sitting, a frequency of: - throughout the day Avoid standing for long periods of time. Additional Orders / Instructions Follow Nutritious Diet - 100-120g of protein daily Wound Treatment Wound #1 - Ankle Wound Laterality: Right, Medial Cleanser: Soap and Water 1 x Per Day/30 Days Discharge Instructions: May shower and wash wound with dial antibacterial soap and water prior to dressing change. Cleanser: Wound Cleanser 1 x Per Day/30 Days Discharge Instructions: Cleanse the wound with wound cleanser prior to applying a clean dressing using gauze sponges, not tissue or cotton balls. Prim Dressing: KerraCel Ag Gelling Fiber Dressing, 4x5 in (silver alginate) ary 1 x Per Day/30 Days Discharge Instructions: Apply silver alginate over Santyl Prim Dressing: Santyl Ointment 1 x Per Day/30 Days ary Discharge Instructions: Apply nickel thick amount to wound bed as instructed Secondary Dressing: ABD Pad, 8x10  (Generic) 1 x Per Day/30 Days Discharge Instructions: Apply over primary dressing as directed. Secondary Dressing: NonWoven Sponge, 4x4 (in/in) (Generic) 1 x Per Day/30 Days Discharge Instructions: Apply over primary dressing as directed Secured With: Elastic Bandage 4 inch (ACE bandage) 1 x Per Day/30 Days Discharge Instructions: Secure with ACE bandage as directed. Secured With: [employer], 4.5x3.1 (in/yd) 1 x Per Day/30 Days Discharge Instructions: Secure with Kerlix as directed. Secured With: Transpore Surgical Tape, 2x10 (in/yd) 1 x Per Day/30 Days Discharge Instructions: Secure dressing with tape as directed. Wound #2 - Foot Wound Laterality: Dorsal, Right Cleanser: Soap and Water 1 x Per Day/30 Days Discharge Instructions: May shower and wash wound with dial antibacterial soap and  water prior to dressing change. Cleanser: Wound Cleanser 1 x Per Day/30 Days Discharge Instructions: Cleanse the wound with wound cleanser prior to applying a clean dressing using gauze sponges, not tissue or cotton balls. Prim Dressing: KerraCel Ag Gelling Fiber Dressing, 4x5 in (silver alginate) ary 1 x Per Day/30 Days Discharge Instructions: Apply silver alginate over Santyl Prim Dressing: Santyl Ointment 1 x Per Day/30 Days ary Discharge Instructions: Apply nickel thick amount to wound bed as instructed Secondary Dressing: ABD Pad, 8x10 (Generic) 1 x Per Day/30 Days Discharge Instructions: Apply over primary dressing as directed. Secondary Dressing: NonWoven Sponge, 4x4 (in/in) (Generic) 1 x Per Day/30 Days Discharge Instructions: Apply over primary dressing as directed Secured With: Elastic Bandage 4 inch (ACE bandage) 1 x Per Day/30 Days Discharge Instructions: Secure with ACE bandage as directed. Secured With: [employer], 4.5x3.1 (in/yd) 1 x Per Day/30 Days Discharge Instructions: Secure with Kerlix as directed. Secured With: Transpore Surgical Tape, 2x10 (in/yd) 1 x Per Day/30  Days Discharge Instructions: Secure dressing with tape as directed. Wound #3 - Ankle Wound Laterality: Left, Medial Cleanser: Soap and Water 1 x Per Day/30 Days Discharge Instructions: May shower and wash wound with dial antibacterial soap and water prior to dressing change. Cleanser: Wound Cleanser 1 x Per Day/30 Days Discharge Instructions: Cleanse the wound with wound cleanser prior to applying a clean dressing using gauze sponges, not tissue or cotton balls. Prim Dressing: KerraCel Ag Gelling Fiber Dressing, 4x5 in (silver alginate) ary 1 x Per Day/30 Days Discharge Instructions: Apply silver alginate over Santyl Prim Dressing: Santyl Ointment 1 x Per Day/30 Days ary Discharge Instructions: Apply nickel thick amount to wound bed as instructed Secondary Dressing: ABD Pad, 8x10 (Generic) 1 x Per Day/30 Days Discharge Instructions: Apply over primary dressing as directed. Secondary Dressing: NonWoven Sponge, 4x4 (in/in) (Generic) 1 x Per Day/30 Days Discharge Instructions: Apply over primary dressing as directed Secured With: Elastic Bandage 4 inch (ACE bandage) 1 x Per Day/30 Days Discharge Instructions: Secure with ACE bandage as directed. Secured With: [employer], 4.5x3.1 (in/yd) 1 x Per Day/30 Days Discharge Instructions: Secure with Kerlix as directed. Secured With: Transpore Surgical Tape, 2x10 (in/yd) 1 x Per Day/30 Days Discharge Instructions: Secure dressing with tape as directed. Wound #4 - Ankle Wound Laterality: Left, Lateral Cleanser: Soap and Water 1 x Per Day/30 Days Discharge Instructions: May shower and wash wound with dial antibacterial soap and water prior to dressing change. Cleanser: Wound Cleanser 1 x Per Day/30 Days Discharge Instructions: Cleanse the wound with wound cleanser prior to applying a clean dressing using gauze sponges, not tissue or cotton balls. Prim Dressing: KerraCel Ag Gelling Fiber Dressing, 4x5 in (silver alginate) ary 1 x Per Day/30  Days Discharge Instructions: Apply silver alginate over Santyl Prim Dressing: Santyl Ointment 1 x Per Day/30 Days ary Discharge Instructions: Apply nickel thick amount to wound bed as instructed Secondary Dressing: ABD Pad, 8x10 (Generic) 1 x Per Day/30 Days Discharge Instructions: Apply over primary dressing as directed. Secondary Dressing: NonWoven Sponge, 4x4 (in/in) (Generic) 1 x Per Day/30 Days Discharge Instructions: Apply over primary dressing as directed Secured With: Elastic Bandage 4 inch (ACE bandage) 1 x Per Day/30 Days Discharge Instructions: Secure with ACE bandage as directed. Secured With: [employer], 4.5x3.1 (in/yd) 1 x Per Day/30 Days Discharge Instructions: Secure with Kerlix as directed. Secured With: Transpore Surgical Tape, 2x10 (in/yd) 1 x Per Day/30 Days Discharge Instructions: Secure dressing with tape as directed. Wound #5 -  Foot Wound Laterality: Dorsal, Left Cleanser: Soap and Water 1 x Per Day/30 Days Discharge Instructions: May shower and wash wound with dial antibacterial soap and water prior to dressing change. Cleanser: Wound Cleanser 1 x Per Day/30 Days Discharge Instructions: Cleanse the wound with wound cleanser prior to applying a clean dressing using gauze sponges, not tissue or cotton balls. Prim Dressing: KerraCel Ag Gelling Fiber Dressing, 4x5 in (silver alginate) ary 1 x Per Day/30 Days Discharge Instructions: Apply silver alginate over Santyl Prim Dressing: Santyl Ointment 1 x Per Day/30 Days ary Discharge Instructions: Apply nickel thick amount to wound bed as instructed Secondary Dressing: ABD Pad, 8x10 (Generic) 1 x Per Day/30 Days Discharge Instructions: Apply over primary dressing as directed. Secondary Dressing: NonWoven Sponge, 4x4 (in/in) (Generic) 1 x Per Day/30 Days Discharge Instructions: Apply over primary dressing as directed Secured With: Elastic Bandage 4 inch (ACE bandage) 1 x Per Day/30 Days Discharge Instructions:  Secure with ACE bandage as directed. Secured With: [employer], 4.5x3.1 (in/yd) 1 x Per Day/30 Days Discharge Instructions: Secure with Kerlix as directed. Secured With: Transpore Surgical Tape, 2x10 (in/yd) 1 x Per Day/30 Days Discharge Instructions: Secure dressing with tape as directed. Wound #6 - Foot Wound Laterality: Left, Medial, Distal Cleanser: Soap and Water 1 x Per Day/30 Days Discharge Instructions: May shower and wash wound with dial antibacterial soap and water prior to dressing change. Cleanser: Wound Cleanser 1 x Per Day/30 Days Discharge Instructions: Cleanse the wound with wound cleanser prior to applying a clean dressing using gauze sponges, not tissue or cotton balls. Prim Dressing: KerraCel Ag Gelling Fiber Dressing, 4x5 in (silver alginate) ary 1 x Per Day/30 Days Discharge Instructions: Apply silver alginate over Santyl Prim Dressing: Santyl Ointment 1 x Per Day/30 Days ary Discharge Instructions: Apply nickel thick amount to wound bed as instructed Secondary Dressing: ABD Pad, 8x10 (Generic) 1 x Per Day/30 Days Discharge Instructions: Apply over primary dressing as directed. Secondary Dressing: NonWoven Sponge, 4x4 (in/in) (Generic) 1 x Per Day/30 Days Discharge Instructions: Apply over primary dressing as directed Secured With: Elastic Bandage 4 inch (ACE bandage) 1 x Per Day/30 Days Discharge Instructions: Secure with ACE bandage as directed. Secured With: [employer], 4.5x3.1 (in/yd) 1 x Per Day/30 Days Discharge Instructions: Secure with Kerlix as directed. Secured With: Transpore Surgical Tape, 2x10 (in/yd) 1 x Per Day/30 Days Discharge Instructions: Secure dressing with tape as directed. Wound #7 - Ankle Wound Laterality: Right, Lateral Cleanser: Soap and Water 1 x Per Day/30 Days Discharge Instructions: May shower and wash wound with dial antibacterial soap and water prior to dressing change. Cleanser: Wound Cleanser 1 x Per Day/30  Days Discharge Instructions: Cleanse the wound with wound cleanser prior to applying a clean dressing using gauze sponges, not tissue or cotton balls. Prim Dressing: KerraCel Ag Gelling Fiber Dressing, 4x5 in (silver alginate) ary 1 x Per Day/30 Days Discharge Instructions: Apply silver alginate over Santyl Prim Dressing: Santyl Ointment ary 1 x Per Day/30 Days Discharge Instructions: Apply nickel thick amount to wound bed as instructed Secondary Dressing: ABD Pad, 8x10 (Generic) 1 x Per Day/30 Days Discharge Instructions: Apply over primary dressing as directed. Secondary Dressing: NonWoven Sponge, 4x4 (in/in) (Generic) 1 x Per Day/30 Days Discharge Instructions: Apply over primary dressing as directed Secured With: Elastic Bandage 4 inch (ACE bandage) 1 x Per Day/30 Days Discharge Instructions: Secure with ACE bandage as directed. Secured With: [employer], 4.5x3.1 (in/yd) 1 x Per Day/30 Days Discharge Instructions: Secure  with Kerlix as directed. Secured With: Transpore Surgical Tape, 2x10 (in/yd) 1 x Per Day/30 Days Discharge Instructions: Secure dressing with tape as directed. Electronic Signature(s) Signed: 04/26/2021 4:20:48 PM By: Kalman Shan DO Entered By: Kalman Shan on 04/26/2021 16:18:08 -------------------------------------------------------------------------------- Problem List Details Patient Name: Date of Service: Candace Robinson, [name] 04/26/2021 3:15 PM Medical Record Number: [id number] Patient Account Number: [id number] Date of Birth/Sex: Treating RN: March 15, 1966 (55 y.o. Candace Robinson Primary Care Provider: [name] Other Clinician: Referring Provider: Treating Provider/Extender: Darcey Nora in Treatment: 9 Active Problems ICD-10 Encounter Code Description Active Date MDM Diagnosis L97.512 Non-pressure chronic ulcer of other part of right foot with fat layer exposed 02/22/2021 No Yes L97.522  Non-pressure chronic ulcer of other part of left foot with fat layer exposed 02/22/2021 No Yes I73.9 Peripheral vascular disease, unspecified 02/22/2021 No Yes E11.9 Type 2 diabetes mellitus without complications 10/10/8117 No Yes L93.2 Other local lupus erythematosus 02/22/2021 No Yes J47.82 Chronic diastolic (congestive) heart failure 02/22/2021 No Yes I10 Essential (primary) hypertension 02/22/2021 No Yes E11.621 Type 2 diabetes mellitus with foot ulcer 02/22/2021 No Yes Inactive Problems Resolved Problems Electronic Signature(s) Signed: 04/26/2021 4:20:48 PM By: Kalman Shan DO Entered By: Kalman Shan on 04/26/2021 16:15:38 -------------------------------------------------------------------------------- Progress Note Details Patient Name: Date of Service: Candace Robinson, [name] 04/26/2021 3:15 PM Medical Record Number: [id number] Patient Account Number: [id number] Date of Birth/Sex: Treating RN: 1966-06-10 (55 y.o. Candace Robinson Primary Care Provider: [name] Other Clinician: Referring Provider: Treating Provider/Extender: Darcey Nora in Treatment: 9 Subjective Chief Complaint Information obtained from Patient Bilateral feet wounds History of Present Illness (HPI) Admission 8/22 Ms. Shainna Faux is a 55 year old female with a past medical history of diet-controlled type 2 diabetes, hypothyroidism, chronic diastolic heart failure, cutaneous lupus erythematosus and essential hypertension that presents to the clinic for a 37-month history of worsening bilateral feet ulcers. She states that wounds to her feet have been an ongoing issue for several years. She states that with wound care they improve however they tend to wax and wane. 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 She was discharged to a nursing facility and she was provided wound care for her bilateral feet ulcers at that time. She states they  used Silvadene and silver alginate. She states that when she was discharged in April from the nursing facility her wounds had improved but were not fully closed. She currently uses Silvadene but has not been using silver alginate for several months due to running out. She reports significant decline in her wound healing over the last 4 months. She reports pain to the wound sites. She currently denies increased warmth erythema or purulent drainage. 8/29; patient presents for 1 week follow-up. She states she had ABIs completed without TBI's. She continues to use silver alginate with dressing changes. She reports continued pain. She denies signs of infection. 9/8; patient presents for 1 week follow-up. She was evaluated by Dr. Donzetta Matters and she reports follow-up with him in 1 month. She has been using Santyl to the wound beds. She reports some improvement in wound healing. She denies signs of infection. 9/15; patient presents for follow-up. She continues to use Santyl and silver alginate to the wound beds. She states the alginate is sticking and hard to remove with dressing changes. She currently denies signs of infection. 9/22; patient presents for 1 week follow-up. She is using Santyl and silver  alginate to the wound beds. She ran out of [place] and has been using Silvadene. She states that gentamicin ointment is ready at her pharmacy and will be picking this up today. She has no issues or complaints today. She denies signs of infection. 9/30; she is using Santyl and silver alginate. She has substantial wounds on her bilateral feet and bilateral ankles that have been there for a year. She is in a lot of pain. She cannot bear to have these wounds touched let alone a mechanical debridement here. Furthermore the etiology of this is really not totally clear. She had reasonably normal ABIs with triphasic waveforms in August [I did not review this however]. She is completing a course of ciprofloxacin for an area  on the left lateral ankle. She has had prior ablations and follows with vascular surgery. Finally she apparently has lupus 10/6; patient has been using Silvadene to the wound beds with dressing changes. She states she is picking up Santyl today. She has no issues or complaints today. She denies signs of infection. 10/17 the patient managed to get Santyl which the she is using to all wound beds. There is a large areas of breakdown here including substantially on the left distal foot, right foot involving the dorsal toes both medial ankles. All of these wounds are covered with some degree of very adherent fibrinous material. The patient did has an appointment with vascular surgery Dr. Donzetta Matters in 2 days time 10/24; patient presents for follow-up. She has been using Santyl and silver alginate to the wound beds. She states that she is going to be set up with rheumatology soon. She denies signs of infection. She overall reports stability in her wound healing. Patient History Information obtained from Patient. Family History Unknown History. Social History Never smoker, Marital Status - Single, Alcohol Use - Never, Drug Use - No History, Caffeine Use - Rarely. Medical History Hematologic/Lymphatic Patient has history of Anemia Cardiovascular Patient has history of Arrhythmia - A-Fib, Congestive Heart Failure, Hypertension Denies history of Angina Endocrine Patient has history of Type II Diabetes Immunological Patient has history of Lupus Erythematosus Medical A Surgical History Notes nd Endocrine Grave's disease Objective Constitutional respirations regular, non-labored and within target range for patient.. Vitals Time Taken: 3:07 PM, Height: 63 in, Weight: 157 lbs, BMI: 27.8, Temperature: 98.2 F, Pulse: 88 bpm, Respiratory Rate: 16 breaths/min, Blood Pressure: 121/78 mmHg. Cardiovascular 2+ dorsalis pedis/posterior tibialis pulses. Psychiatric pleasant and cooperative. General Notes:  Wounds throughout feet bilaterally with mostly nonviable tissue and scant granulation tissue present. No evidence of surrounding tissue infection. Integumentary (Hair, Skin) Wound #1 status is Open. Original cause of wound was Gradually Appeared. The date acquired was: 04/03/2020. The wound has been in treatment 9 weeks. The wound is located on the Right,Medial Ankle. The wound measures 5.6cm length x 4.4cm width x 0.3cm depth; 19.352cm^2 area and 5.806cm^3 volume. There is Fat Layer (Subcutaneous Tissue) exposed. There is no tunneling or undermining noted. There is a medium amount of serosanguineous drainage noted. The wound margin is distinct with the outline attached to the wound base. There is small (1-33%) red granulation within the wound bed. There is a large (67-100%) amount of necrotic tissue within the wound bed including Adherent Slough. Wound #2 status is Open. Original cause of wound was Gradually Appeared. The date acquired was: 04/03/2020. The wound has been in treatment 9 weeks. The wound is located on the Right,Dorsal Foot. The wound measures 6cm length x 5.5cm width x 0.1cm depth;  25.918cm^2 area and 2.592cm^3 volume. There is Fat Layer (Subcutaneous Tissue) exposed. There is no tunneling or undermining noted. There is a medium amount of serosanguineous drainage noted. The wound margin is distinct with the outline attached to the wound base. There is small (1-33%) red granulation within the wound bed. There is a large (67-100%) amount of necrotic tissue within the wound bed including Adherent Slough. Wound #3 status is Open. Original cause of wound was Gradually Appeared. The date acquired was: 02/22/2021. The wound has been in treatment 9 weeks. The wound is located on the Left,Medial Ankle. The wound measures 1cm length x 2.3cm width x 0.2cm depth; 1.806cm^2 area and 0.361cm^3 volume. There is Fat Layer (Subcutaneous Tissue) exposed. There is no tunneling or undermining noted. There is  a medium amount of serosanguineous drainage noted. The wound margin is distinct with the outline attached to the wound base. There is large (67-100%) red granulation within the wound bed. There is a small (1-33%) amount of necrotic tissue within the wound bed including Adherent Slough. Wound #4 status is Open. Original cause of wound was Gradually Appeared. The date acquired was: 04/03/2020. The wound has been in treatment 9 weeks. The wound is located on the Left,Lateral Ankle. The wound measures 8cm length x 5.6cm width x 0.2cm depth; 35.186cm^2 area and 7.037cm^3 volume. There is Fat Layer (Subcutaneous Tissue) exposed. There is no tunneling or undermining noted. There is a medium amount of serosanguineous drainage noted. The wound margin is distinct with the outline attached to the wound base. There is medium (34-66%) red granulation within the wound bed. There is a medium (34- 66%) amount of necrotic tissue within the wound bed including Adherent Slough. Wound #5 status is Open. Original cause of wound was Gradually Appeared. The date acquired was: 04/03/2020. The wound has been in treatment 9 weeks. The wound is located on the Left,Dorsal Foot. The wound measures 10cm length x 9.5cm width x 0.2cm depth; 74.613cm^2 area and 14.923cm^3 volume. There is Fat Layer (Subcutaneous Tissue) exposed. There is no tunneling or undermining noted. There is a medium amount of serosanguineous drainage noted. The wound margin is distinct with the outline attached to the wound base. There is small (1-33%) red granulation within the wound bed. There is a large (67-100%) amount of necrotic tissue within the wound bed including Adherent Slough. Wound #6 status is Open. Original cause of wound was Gradually Appeared. The date acquired was: 04/03/2020. The wound has been in treatment 9 weeks. The wound is located on the Left,Distal,Medial Foot. The wound measures 0.5cm length x 0.5cm width x 0.1cm depth; 0.196cm^2 area  and 0.02cm^3 volume. There is Fat Layer (Subcutaneous Tissue) exposed. There is no tunneling or undermining noted. There is a medium amount of serosanguineous drainage noted. The wound margin is distinct with the outline attached to the wound base. There is large (67-100%) red, pink granulation within the wound bed. There is a small (1- 33%) amount of necrotic tissue within the wound bed including Adherent Slough. Wound #7 status is Open. Original cause of wound was Gradually Appeared. The date acquired was: 04/08/2021. The wound has been in treatment 2 weeks. The wound is located on the Right,Lateral Ankle. The wound measures 0.5cm length x 0.8cm width x 0.1cm depth; 0.314cm^2 area and 0.031cm^3 volume. There is Fat Layer (Subcutaneous Tissue) exposed. There is no tunneling or undermining noted. There is a medium amount of serosanguineous drainage noted. The wound margin is distinct with the outline attached to the wound  base. There is small (1-33%) pink granulation within the wound bed. There is a large (67-100%) amount of necrotic tissue within the wound bed including Adherent Slough. Assessment Active Problems ICD-10 Non-pressure chronic ulcer of other part of right foot with fat layer exposed Non-pressure chronic ulcer of other part of left foot with fat layer exposed Peripheral vascular disease, unspecified Type 2 diabetes mellitus without complications Other local lupus erythematosus Chronic diastolic (congestive) heart failure Essential (primary) hypertension Type 2 diabetes mellitus with foot ulcer Patient's wounds have shown slight improvement in appearance since last clinic visit. I debrided nonviable tissue. No signs of infection. I recommended continuing Santyl with silver alginate to the wound beds. Patient saw Dr. Donzetta Matters on 10/19. There is no evidence for arterial or venous etiology to these wounds. At this time I think this is related to her lupus. I recommended following up with  rheumatology or dermatology. Procedures Wound #1 Pre-procedure diagnosis of Wound #1 is a Diabetic Wound/Ulcer of the Lower Extremity located on the Right,Medial Ankle .Severity of Tissue Pre Debridement is: Fat layer exposed. There was a Selective/Open Wound Non-Viable Tissue Debridement with a total area of 9 sq cm performed by Kalman Shan, DO. With the following instrument(s): Curette to remove Non-Viable tissue/material. Material removed includes [place]. No specimens were taken. A time out was conducted at 15:58, prior to the start of the procedure. There was no bleeding. The procedure was tolerated well with a pain level of 0 throughout and a pain level of 0 following the procedure. Post Debridement Measurements: 5.6cm length x 4.4cm width x 0.3cm depth; 5.806cm^3 volume. Character of Wound/Ulcer Post Debridement requires further debridement. Severity of Tissue Post Debridement is: Fat layer exposed. Post procedure Diagnosis Wound #1: Same as Pre-Procedure Wound #5 Pre-procedure diagnosis of Wound #5 is a Diabetic Wound/Ulcer of the Lower Extremity located on the Left,Dorsal Foot .Severity of Tissue Pre Debridement is: Fat layer exposed. There was a Selective/Open Wound Non-Viable Tissue Debridement with a total area of 16 sq cm performed by Kalman Shan, DO. With the following instrument(s): Curette to remove Non-Viable tissue/material. Material removed includes [place]. No specimens were taken. A time out was conducted at 15:58, prior to the start of the procedure. There was no bleeding. The procedure was tolerated well with a pain level of 0 throughout and a pain level of 0 following the procedure. Post Debridement Measurements: 10cm length x 9.5cm width x 0.2cm depth; 14.923cm^3 volume. Character of Wound/Ulcer Post Debridement requires further debridement. Severity of Tissue Post Debridement is: Fat layer exposed. Post procedure Diagnosis Wound #5: Same as  Pre-Procedure Plan Follow-up Appointments: Return Appointment in 2 weeks. - with Dr. Heber [place] *****EXTRA TIME - 71 MINUTES***** Other: - [facility]=Supplies Bathing/ Shower/ Hygiene: May shower and wash wound with soap and water. - when dressings changed Edema Control - Lymphedema / SCD / Other: Elevate legs to the level of the heart or above for 30 minutes daily and/or when sitting, a frequency of: - throughout the day Avoid standing for long periods of time. Additional Orders / Instructions: Follow Nutritious Diet - 100-120g of protein daily WOUND #1: - Ankle Wound Laterality: Right, Medial Cleanser: Soap and Water 1 x Per Day/30 Days Discharge Instructions: May shower and wash wound with dial antibacterial soap and water prior to dressing change. Cleanser: Wound Cleanser 1 x Per Day/30 Days Discharge Instructions: Cleanse the wound with wound cleanser prior to applying a clean dressing using gauze sponges, not tissue or cotton balls. Prim Dressing: Paulina Fusi  Ag Gelling Fiber Dressing, 4x5 in (silver alginate) 1 x Per Day/30 Days ary Discharge Instructions: Apply silver alginate over Santyl Prim Dressing: Santyl Ointment 1 x Per Day/30 Days ary Discharge Instructions: Apply nickel thick amount to wound bed as instructed Secondary Dressing: ABD Pad, 8x10 (Generic) 1 x Per Day/30 Days Discharge Instructions: Apply over primary dressing as directed. Secondary Dressing: NonWoven Sponge, 4x4 (in/in) (Generic) 1 x Per Day/30 Days Discharge Instructions: Apply over primary dressing as directed Secured With: Elastic Bandage 4 inch (ACE bandage) 1 x Per Day/30 Days Discharge Instructions: Secure with ACE bandage as directed. Secured With: [employer], 4.5x3.1 (in/yd) 1 x Per Day/30 Days Discharge Instructions: Secure with Kerlix as directed. Secured With: Transpore Surgical T ape, 2x10 (in/yd) 1 x Per Day/30 Days Discharge Instructions: Secure dressing with tape as directed. WOUND  #2: - Foot Wound Laterality: Dorsal, Right Cleanser: Soap and Water 1 x Per Day/30 Days Discharge Instructions: May shower and wash wound with dial antibacterial soap and water prior to dressing change. Cleanser: Wound Cleanser 1 x Per Day/30 Days Discharge Instructions: Cleanse the wound with wound cleanser prior to applying a clean dressing using gauze sponges, not tissue or cotton balls. Prim Dressing: KerraCel Ag Gelling Fiber Dressing, 4x5 in (silver alginate) 1 x Per Day/30 Days ary Discharge Instructions: Apply silver alginate over Santyl Prim Dressing: Santyl Ointment 1 x Per Day/30 Days ary Discharge Instructions: Apply nickel thick amount to wound bed as instructed Secondary Dressing: ABD Pad, 8x10 (Generic) 1 x Per Day/30 Days Discharge Instructions: Apply over primary dressing as directed. Secondary Dressing: NonWoven Sponge, 4x4 (in/in) (Generic) 1 x Per Day/30 Days Discharge Instructions: Apply over primary dressing as directed Secured With: Elastic Bandage 4 inch (ACE bandage) 1 x Per Day/30 Days Discharge Instructions: Secure with ACE bandage as directed. Secured With: [employer], 4.5x3.1 (in/yd) 1 x Per Day/30 Days Discharge Instructions: Secure with Kerlix as directed. Secured With: Trans[employer]ore Surgical T ape, 2x10 (in/yd) 1 x Per Day/30 Days Discharge Instructions: Secure dressing with tape as directed. WOUND #3: - Ankle Wound Laterality: Left, Medial Cleanser: Soap and Water 1 x Per Day/30 Days Discharge Instructions: May shower and wash wound with dial antibacterial soap and water prior to dressing change. Cleanser: Wound Cleanser 1 x Per Day/30 Days Discharge Instructions: Cleanse the wound with wound cleanser prior to applying a clean dressing using gauze sponges, not tissue or cotton balls. Prim Dressing: KerraCel Ag Gelling Fiber Dressing, 4x5 in (silver alginate) 1 x Per Day/30 Days ary Discharge Instructions: Apply silver alginate over Santyl Prim  Dressing: Santyl Ointment 1 x Per Day/30 Days ary Discharge Instructions: Apply nickel thick amount to wound bed as instructed Secondary Dressing: ABD Pad, 8x10 (Generic) 1 x Per Day/30 Days Discharge Instructions: Apply over primary dressing as directed. Secondary Dressing: NonWoven Sponge, 4x4 (in/in) (Generic) 1 x Per Day/30 Days Discharge Instructions: Apply over primary dressing as directed Secured With: Elastic Bandage 4 inch (ACE bandage) 1 x Per Day/30 Days Discharge Instructions: Secure with ACE bandage as directed. Secured With: [employer], 4.5x3.1 (in/yd) 1 x Per Day/30 Days Discharge Instructions: Secure with Kerlix as directed. Secured With: Trans[employer] Surgical T ape, 2x10 (in/yd) 1 x Per Day/30 Days Discharge Instructions: Secure dressing with tape as directed. WOUND #4: - Ankle Wound Laterality: Left, Lateral Cleanser: Soap and Water 1 x Per Day/30 Days Discharge Instructions: May shower and wash wound with dial antibacterial soap and water prior to dressing change. Cleanser: Wound Cleanser 1  x Per Day/30 Days Discharge Instructions: Cleanse the wound with wound cleanser prior to applying a clean dressing using gauze sponges, not tissue or cotton balls. Prim Dressing: KerraCel Ag Gelling Fiber Dressing, 4x5 in (silver alginate) 1 x Per Day/30 Days ary Discharge Instructions: Apply silver alginate over Santyl Prim Dressing: Santyl Ointment 1 x Per Day/30 Days ary Discharge Instructions: Apply nickel thick amount to wound bed as instructed Secondary Dressing: ABD Pad, 8x10 (Generic) 1 x Per Day/30 Days Discharge Instructions: Apply over primary dressing as directed. Secondary Dressing: NonWoven Sponge, 4x4 (in/in) (Generic) 1 x Per Day/30 Days Discharge Instructions: Apply over primary dressing as directed Secured With: Elastic Bandage 4 inch (ACE bandage) 1 x Per Day/30 Days Discharge Instructions: Secure with ACE bandage as directed. Secured With: [employer], 4.5x3.1 (in/yd) 1 x Per Day/30 Days Discharge Instructions: Secure with Kerlix as directed. Secured With: Trans[employer]ore Surgical T ape, 2x10 (in/yd) 1 x Per Day/30 Days Discharge Instructions: Secure dressing with tape as directed. WOUND #5: - Foot Wound Laterality: Dorsal, Left Cleanser: Soap and Water 1 x Per Day/30 Days Discharge Instructions: May shower and wash wound with dial antibacterial soap and water prior to dressing change. Cleanser: Wound Cleanser 1 x Per Day/30 Days Discharge Instructions: Cleanse the wound with wound cleanser prior to applying a clean dressing using gauze sponges, not tissue or cotton balls. Prim Dressing: KerraCel Ag Gelling Fiber Dressing, 4x5 in (silver alginate) 1 x Per Day/30 Days ary Discharge Instructions: Apply silver alginate over Santyl Prim Dressing: Santyl Ointment 1 x Per Day/30 Days ary Discharge Instructions: Apply nickel thick amount to wound bed as instructed Secondary Dressing: ABD Pad, 8x10 (Generic) 1 x Per Day/30 Days Discharge Instructions: Apply over primary dressing as directed. Secondary Dressing: NonWoven Sponge, 4x4 (in/in) (Generic) 1 x Per Day/30 Days Discharge Instructions: Apply over primary dressing as directed Secured With: Elastic Bandage 4 inch (ACE bandage) 1 x Per Day/30 Days Discharge Instructions: Secure with ACE bandage as directed. Secured With: [employer], 4.5x3.1 (in/yd) 1 x Per Day/30 Days Discharge Instructions: Secure with Kerlix as directed. Secured With: Trans[employer] Surgical T ape, 2x10 (in/yd) 1 x Per Day/30 Days Discharge Instructions: Secure dressing with tape as directed. WOUND #6: - Foot Wound Laterality: Left, Medial, Distal Cleanser: Soap and Water 1 x Per Day/30 Days Discharge Instructions: May shower and wash wound with dial antibacterial soap and water prior to dressing change. Cleanser: Wound Cleanser 1 x Per Day/30 Days Discharge Instructions: Cleanse the wound with wound cleanser  prior to applying a clean dressing using gauze sponges, not tissue or cotton balls. Prim Dressing: KerraCel Ag Gelling Fiber Dressing, 4x5 in (silver alginate) 1 x Per Day/30 Days ary Discharge Instructions: Apply silver alginate over Santyl Prim Dressing: Santyl Ointment 1 x Per Day/30 Days ary Discharge Instructions: Apply nickel thick amount to wound bed as instructed Secondary Dressing: ABD Pad, 8x10 (Generic) 1 x Per Day/30 Days Discharge Instructions: Apply over primary dressing as directed. Secondary Dressing: NonWoven Sponge, 4x4 (in/in) (Generic) 1 x Per Day/30 Days Discharge Instructions: Apply over primary dressing as directed Secured With: Elastic Bandage 4 inch (ACE bandage) 1 x Per Day/30 Days Discharge Instructions: Secure with ACE bandage as directed. Secured With: [employer], 4.5x3.1 (in/yd) 1 x Per Day/30 Days Discharge Instructions: Secure with Kerlix as directed. Secured With: Trans[employer] Surgical T ape, 2x10 (in/yd) 1 x Per Day/30 Days Discharge Instructions: Secure dressing with tape as directed. WOUND #7: - Ankle Wound Laterality: Right, Lateral  Cleanser: Soap and Water 1 x Per Day/30 Days Discharge Instructions: May shower and wash wound with dial antibacterial soap and water prior to dressing change. Cleanser: Wound Cleanser 1 x Per Day/30 Days Discharge Instructions: Cleanse the wound with wound cleanser prior to applying a clean dressing using gauze sponges, not tissue or cotton balls. Prim Dressing: KerraCel Ag Gelling Fiber Dressing, 4x5 in (silver alginate) 1 x Per Day/30 Days ary Discharge Instructions: Apply silver alginate over Santyl Prim Dressing: Santyl Ointment 1 x Per Day/30 Days ary Discharge Instructions: Apply nickel thick amount to wound bed as instructed Secondary Dressing: ABD Pad, 8x10 (Generic) 1 x Per Day/30 Days Discharge Instructions: Apply over primary dressing as directed. Secondary Dressing: NonWoven Sponge, 4x4 (in/in)  (Generic) 1 x Per Day/30 Days Discharge Instructions: Apply over primary dressing as directed Secured With: Elastic Bandage 4 inch (ACE bandage) 1 x Per Day/30 Days Discharge Instructions: Secure with ACE bandage as directed. Secured With: [employer], 4.5x3.1 (in/yd) 1 x Per Day/30 Days Discharge Instructions: Secure with Kerlix as directed. Secured With: Transpore Surgical T ape, 2x10 (in/yd) 1 x Per Day/30 Days Discharge Instructions: Secure dressing with tape as directed. 1. Follow-up with rheumatology or dermatology 2. Continue Santyl with silver alginate daily to the wound beds 3. Follow-up in 2 weeks Electronic Signature(s) Signed: 04/26/2021 4:20:48 PM By: Kalman Shan DO Entered By: Kalman Shan on 04/26/2021 16:20:01 -------------------------------------------------------------------------------- HxROS Details Patient Name: Date of Service: Candace Robinson, [name] 04/26/2021 3:15 PM Medical Record Number: [id number] Patient Account Number: [id number] Date of Birth/Sex: Treating RN: December 11, 1965 (55 y.o. Candace Robinson Primary Care Provider: [name] Other Clinician: Referring Provider: Treating Provider/Extender: Darcey Nora in Treatment: 9 Information Obtained From Patient Hematologic/Lymphatic Medical History: Positive for: Anemia Cardiovascular Medical History: Positive for: Arrhythmia - A-Fib; Congestive Heart Failure; Hypertension Negative for: Angina Endocrine Medical History: Positive for: Type II Diabetes Past Medical History Notes: Grave's disease Time with diabetes: over 10 years Treated with: Diet Blood sugar tested every day: No Immunological Medical History: Positive for: Lupus Erythematosus Immunizations Pneumococcal Vaccine: Received Pneumococcal Vaccination: No Implantable Devices None Family and Social History Unknown History: Yes; Never smoker; Marital Status - Single; Alcohol Use:  Never; Drug Use: No History; Caffeine Use: Rarely; Financial Concerns: No; Food, Clothing or Shelter Needs: No; Support System Lacking: No; Transportation Concerns: No Electronic Signature(s) Signed: 04/26/2021 4:20:48 PM By: Kalman Shan DO Signed: 04/26/2021 4:46:21 PM By: Lorrin Jackson Entered By: Kalman Shan on 04/26/2021 16:16:57 -------------------------------------------------------------------------------- SuperBill Details Patient Name: Date of Service: Candace Robinson, [name] 04/26/2021 Medical Record Number: [id number] Patient Account Number: [id number] Date of Birth/Sex: Treating RN: 1966/04/16 (55 y.o. Candace Robinson Primary Care Provider: [name] Other Clinician: Referring Provider: Treating Provider/Extender: Darcey Nora in Treatment: 9 Diagnosis Coding ICD-10 Codes Code Description [phone] Non-pressure chronic ulcer of other part of right foot with fat layer exposed L97.522 Non-pressure chronic ulcer of other part of left foot with fat layer exposed I73.9 Peripheral vascular disease, unspecified E11.9 Type 2 diabetes mellitus without complications E72.0 Other local lupus erythematosus N47.09 Chronic diastolic (congestive) heart failure I10 Essential (primary) hypertension E11.621 Type 2 diabetes mellitus with foot ulcer Facility Procedures CPT4 Code: [id number] Description: [phone] - DEBRIDE WOUND 1ST 20 SQ CM OR < ICD-10 Diagnosis Description L97.512 Non-pressure chronic ulcer of other part of right foot with fat layer exposed E11.9 Type 2 diabetes mellitus without complications Y50.3 Other local  lupus  erythematosus Modifier: Quantity: 1 CPT4 Code: [id number] Description: 90211 - DEBRIDE WOUND EA ADDL 20 SQ CM ICD-10 Diagnosis Description L97.522 Non-pressure chronic ulcer of other part of left foot with fat layer exposed E11.9 Type 2 diabetes mellitus without complications D55.2 Other local lupus   erythematosus Modifier: Quantity: 1 Physician Procedures : CPT4 Code Description Modifier [id number] - WC PHYS DEBR WO ANESTH 20 SQ CM ICD-10 Diagnosis Description L97.512 Non-pressure chronic ulcer of other part of right foot with fat layer exposed E11.9 Type 2 diabetes mellitus without complications S97.5  Other local lupus erythematosus Quantity: 1 Electronic Signature(s) Signed: 04/26/2021 4:20:48 PM By: Kalman Shan DO Entered By: Kalman Shan on 04/26/2021 16:20:25

## 2021-04-26 NOTE — Progress Notes (Signed)
Candace, Robinson ([id number]) Visit Report for 04/26/2021 Arrival Information Details Patient Name: Date of Service: Candace Robinson, [name] 04/26/2021 3:15 PM Medical Record Number: [id number] Patient Account Number: [id number] Date of Birth/Sex: Treating RN: 18-Sep-1965 (55 y.o. Candace Robinson, Candace Robinson Primary Care [name]: [name] Other Clinician: Referring [name]: Treating [name]/Extender: Darcey Nora in Treatment: 9 Visit Information History Since Last Visit Added or deleted any medications: No Patient Arrived: [name] Any new allergies or adverse reactions: No Arrival Time: 15:03 Had a fall or experienced change in No Accompanied By: alone activities of daily living that may affect Transfer Assistance: None risk of falls: Patient Identification Verified: Yes Signs or symptoms of abuse/neglect since last visito No Secondary Verification Process Completed: Yes Hospitalized since last visit: No Patient Requires Transmission-Based No Implantable device outside of the clinic excluding No Precautions: cellular tissue based products placed in the center Patient Has Alerts: Yes since last visit: Patient Alerts: R ABI: 1.0 L ABI: 1.1 Has Dressing in Place as Prescribed: Yes NO BENZOCAINE Pain Present Now: Yes USE LIDOCAINE GEL ONLY Electronic Signature(s) Signed: 04/26/2021 5:18:13 PM By: Levan Hurst RN, BSN Entered By: Levan Hurst on 04/26/2021 15:07:34 -------------------------------------------------------------------------------- Encounter Discharge Information Details Patient Name: Date of Service: Candace Robinson, [name] 04/26/2021 3:15 PM Medical Record Number: [id number] Patient Account Number: [id number] Date of Birth/Sex: Treating RN: [birth year] (55 y.o. Candace Robinson Primary Care Candace Robinson: [name] Other Clinician: Referring Candace Robinson: Treating Candace Robinson/Extender: Darcey Nora in  Treatment: 9 Encounter Discharge Information Items Post Procedure Vitals Discharge Condition: Stable Temperature (F): 98.2 Ambulatory Status: Walker Pulse (bpm): 88 Discharge Destination: Home Respiratory Rate (breaths/min): 16 Transportation: Private Auto Blood Pressure (mmHg): 121/78 Accompanied By: alone Schedule Follow-up Appointment: Yes Clinical Summary of Care: Patient Declined Electronic Signature(s) Signed: 04/26/2021 5:18:13 PM By: Levan Hurst RN, BSN Entered By: Levan Hurst on 04/26/2021 17:01:50 -------------------------------------------------------------------------------- Lower Extremity Assessment Details Patient Name: Date of Service: Candace Robinson, [name] 04/26/2021 3:15 PM Medical Record Number: [id number] Patient Account Number: [id number] Date of Birth/Sex: Treating RN: 06-Sep-1965 (55 y.o. Candace Robinson Primary Care Jodi Criscuolo: [name] Other Clinician: Referring [name]: Treating [name]/Extender: [name] Weeks in Treatment: 9 Edema Assessment Assessed: [name]: No] [name]: No] Edema: [Left: Yes] [Right: Yes] Calf Left: Right: Point of Measurement: 29 cm From Medial Instep 34 cm 32 cm Ankle Left: Right: Point of Measurement: 10 cm From Medial Instep 23.5 cm 23 cm Vascular Assessment Pulses: Dorsalis Pedis Palpable: [Left:Yes] [Right:Yes] Electronic Signature(s) Signed: 04/26/2021 5:18:13 PM By: Levan Hurst RN, BSN Entered By: Levan Hurst on 04/26/2021 15:14:00 -------------------------------------------------------------------------------- Multi Wound Chart Details Patient Name: Date of Service: Candace Robinson, [name] 04/26/2021 3:15 PM Medical Record Number: [id number] Patient Account Number: [id number] Date of Birth/Sex: Treating RN: 12-10-1965 (55 y.o. Candace Robinson Primary Care Candace Robinson: [name] Other Clinician: Referring Ediel Unangst: Treating Candace Robinson/Extender: [name] Weeks in Treatment: 9 Vital Signs Height(in): 3 Pulse(bpm): 40 Weight(lbs): 157 Blood Pressure(mmHg): 121/78 Body Mass Index(BMI): 28 Temperature(F): 98.2 Respiratory Rate(breaths/min): 16 Photos: [1:Right, Medial Ankle] [2:Right, Dorsal Foot] [3:Left, Medial Ankle] Wound Location: [1:Gradually Appeared] [2:Gradually Appeared] [3:Gradually Appeared] Wounding Event: [1:Diabetic Wound/Ulcer of the Lower] [2:Diabetic Wound/Ulcer of the Lower] [3:Diabetic Wound/Ulcer of the Lower] Primary Etiology: [1:Extremity Anemia, Arrhythmia, Congestive Heart Anemia, Arrhythmia, Congestive Heart Anemia, Arrhythmia, Congestive Heart] [2:Extremity] [3:Extremity] Comorbid History: [1:Failure, Hypertension, Type II Diabetes, Lupus Erythematosus  04/03/2020] [2:Failure, Hypertension, Type II Diabetes, Lupus Erythematosus 04/03/2020] [3:Failure, Hypertension, Type II Diabetes, Lupus Erythematosus 02/22/2021] Date Acquired: [1:9] [2:9] [3:9] Weeks of Treatment: [1:Open] [2:Open] [3:Open] Wound Status: [1:5.6x4.4x0.3] [2:6x5.5x0.1] [3:1x2.3x0.2] Measurements [name] [name] D (cm) [1:19.352] [2:25.918] [3:1.806] A (cm) : rea [1:5.806] [2:2.592] [3:0.361] Volume (cm) : [1:-17.30%] [2:-120.00%] [3:8.00%] % Reduction in A [1:rea: -76.00%] [2:-120.00%] [3:8.10%] % Reduction in Volume: [1:Grade 2] [2:Grade 2] [3:Grade 2] Classification: [1:Medium] [2:Medium] [3:Medium] Exudate A mount: [1:Serosanguineous] [2:Serosanguineous] [3:Serosanguineous] Exudate Type: [1:red, brown] [2:red, brown] [3:red, brown] Exudate Color: [1:Distinct, outline attached] [2:Distinct, outline attached] [3:Distinct, outline attached] Wound Margin: [1:Small (1-33%)] [2:Small (1-33%)] [3:Large (67-100%)] Granulation A mount: [1:Red] [2:Red] [3:Red] Granulation Quality: [1:Large (67-100%)] [2:Large (67-100%)] [3:Small (1-33%)] Necrotic A mount: [1:Fat Layer (Subcutaneous Tissue): Yes Fat Layer (Subcutaneous Tissue): Yes  Fat Layer (Subcutaneous Tissue): Yes] Exposed Structures: [1:Fascia: No Tendon: No Muscle: No Joint: No Bone: No Small (1-33%)] [2:Fascia: No Tendon: No Muscle: No Joint: No Bone: No Small (1-33%)] [3:Fascia: No Tendon: No Muscle: No Joint: No Bone: No Medium (34-66%)] Epithelialization: [1:Debridement - Selective/Open Wound N/A] [3:N/A] Debridement: Pre-procedure Verification/Time Out 15:58 [2:N/A] [3:N/A] Taken: [1:Slough] [2:N/A] [3:N/A] Tissue Debrided: [1:Non-Viable Tissue] [2:N/A] [3:N/A] Level: [1:9] [2:N/A] [3:N/A] Debridement A (sq cm): [1:rea Curette] [2:N/A] [3:N/A] Instrument: [1:None] [2:N/A] [3:N/A] Bleeding: [1:0] [2:N/A] [3:N/A] Procedural Pain: [1:0] [2:N/A] [3:N/A] Post Procedural Pain: [1:Procedure was tolerated well] [2:N/A] [3:N/A] Debridement Treatment Response: [1:5.6x4.4x0.3] [2:N/A] [3:N/A] Post Debridement Measurements [name] [name] D (cm) [1:5.806] [2:N/A] [3:N/A] Post Debridement Volume: (cm) [1:Debridement] [2:N/A] [3:N/A] Wound Number: 4 5 6  Photos: Left, Lateral Ankle Left, Dorsal Foot Left, Distal, Medial Foot Wound Location: Gradually Appeared Gradually Appeared Gradually Appeared Wounding Event: Diabetic Wound/Ulcer of the Lower Diabetic Wound/Ulcer of the Lower Diabetic Wound/Ulcer of the Lower Primary Etiology: Extremity Extremity Extremity Anemia, Arrhythmia, Congestive Heart Anemia, Arrhythmia, Congestive Heart Anemia, Arrhythmia, Congestive Heart Comorbid History: Failure, Hypertension, Type II Failure, Hypertension, Type II Failure, Hypertension, Type II Diabetes, Lupus Erythematosus Diabetes, Lupus Erythematosus Diabetes, Lupus Erythematosus 04/03/2020 04/03/2020 04/03/2020 Date Acquired: 9 9 9  Weeks of Treatment: Open Open Open Wound Status: 8x5.6x0.2 10x9.5x0.2 0.5x0.5x0.1 Measurements [name] [name] D (cm) 35.186 74.613 0.196 A (cm) : rea 7.037 14.923 0.02 Volume (cm) : 6.70% -24.20% 37.60% % Reduction in A rea: 6.70% -148.40% 35.50% %  Reduction in Volume: Grade 2 Grade 2 Grade 2 Classification: Medium Medium Medium Exudate A mount: Serosanguineous Serosanguineous Serosanguineous Exudate Type: red, brown red, brown red, brown Exudate Color: Distinct, outline attached Distinct, outline attached Distinct, outline attached Wound Margin: Medium (34-66%) Small (1-33%) Large (67-100%) Granulation A mount: Red Red Red, Pink Granulation Quality: Medium (34-66%) Large (67-100%) Small (1-33%) Necrotic A mount: Fat Layer (Subcutaneous Tissue): Yes Fat Layer (Subcutaneous Tissue): Yes Fat Layer (Subcutaneous Tissue): Yes Exposed Structures: Fascia: No Fascia: No Fascia: No Tendon: No Tendon: No Tendon: No Muscle: No Muscle: No Muscle: No Joint: No Joint: No Joint: No Bone: No Bone: No Bone: No Small (1-33%) None Medium (34-66%) Epithelialization: N/A Debridement - Selective/Open Wound N/A Debridement: N/A 15:58 N/A Pre-procedure Verification/Time Out Taken: N/A Slough N/A Tissue Debrided: N/A Non-Viable Tissue N/A Level: N/A 16 N/A Debridement A (sq cm): rea N/A Curette N/A Instrument: N/A None N/A Bleeding: N/A 0 N/A Procedural Pain: N/A 0 N/A Post Procedural Pain: N/A Procedure was tolerated well N/A Debridement Treatment Response: N/A 10x9.5x0.2 N/A Post Debridement Measurements [name] [name] D (cm) N/A 14.923 N/A Post Debridement Volume: (cm) N/A Debridement N/A Procedures Performed: Wound  Number: 7 N/A N/A Photos: N/A N/A Right, Lateral Ankle N/A N/A Wound Location: Gradually Appeared N/A N/A Wounding Event: Lupus N/A N/A Primary Etiology: Anemia, Arrhythmia, Congestive Heart N/A N/A Comorbid History: Failure, Hypertension, Type II Diabetes, Lupus Erythematosus 04/08/2021 N/A N/A Date A cquired: 2 N/A N/A Weeks of Treatment: Open N/A N/A Wound Status: 0.5x0.8x0.1 N/A N/A Measurements [name] [name] D (cm) 0.314 N/A N/A A (cm) : rea 0.031 N/A N/A Volume (cm) : -912.90% N/A  N/A % Reduction in A rea: -933.30% N/A N/A % Reduction in Volume: Full Thickness Without Exposed N/A N/A Classification: Support Structures Medium N/A N/A Exudate A mount: Serosanguineous N/A N/A Exudate Type: red, brown N/A N/A Exudate Color: Distinct, outline attached N/A N/A Wound Margin: Small (1-33%) N/A N/A Granulation A mount: Pink N/A N/A Granulation Quality: Large (67-100%) N/A N/A Necrotic A mount: Fat Layer (Subcutaneous Tissue): Yes N/A N/A Exposed Structures: Fascia: No Tendon: No Muscle: No Joint: No Bone: No None N/A N/A Epithelialization: N/A N/A N/A Debridement: N/A N/A N/A Tissue Debrided: N/A N/A N/A Level: N/A N/A N/A Debridement A (sq cm): rea N/A N/A N/A Instrument: N/A N/A N/A Bleeding: N/A N/A N/A Procedural Pain: N/A N/A N/A Post Procedural Pain: Debridement Treatment Response: N/A N/A N/A Post Debridement Measurements [name] N/A N/A N/A [name] D (cm) N/A N/A N/A Post Debridement Volume: (cm) N/A N/A N/A Procedures Performed: Treatment Notes Electronic Signature(s) Signed: 04/26/2021 4:20:48 PM By: Kalman Shan DO Signed: 04/26/2021 4:46:21 PM By: Lorrin Jackson Entered By: Kalman Shan on 04/26/2021 16:15:48 -------------------------------------------------------------------------------- Multi-Disciplinary Care Plan Details Patient Name: Date of Service: Candace Robinson, [name] 04/26/2021 3:15 PM Medical Record Number: [id number] Patient Account Number: [id number] Date of Birth/Sex: Treating RN: 1966/02/08 (55 y.o. Candace Robinson Primary Care [place]ah Rosamond: [name] Other Clinician: Referring Loralie Malta: Treating Sonya Gunnoe/Extender: [name] Weeks in Treatment: 9 Active Inactive Wound/Skin Impairment Nursing Diagnoses: Impaired tissue integrity Goals: Patient/caregiver will verbalize understanding of skin care regimen Date Initiated: 02/22/2021 Target Resolution Date:  05/28/2021 Goal Status: Active Interventions: Assess patient/caregiver ability to obtain necessary supplies Assess patient/caregiver ability to perform ulcer/skin care regimen upon admission and as needed Assess ulceration(s) every visit Provide education on ulcer and skin care Treatment Activities: Topical wound management initiated : 02/22/2021 Notes: 03/25/21: All wounds not yet at 30%, culture positive. Using gent cream. Electronic Signature(s) Signed: 04/26/2021 5:18:13 PM By: Levan Hurst RN, BSN Entered By: Levan Hurst on 04/26/2021 15:35:57 -------------------------------------------------------------------------------- Pain Assessment Details Patient Name: Date of Service: Candace Robinson, [name] 04/26/2021 3:15 PM Medical Record Number: [id number] Patient Account Number: [id number] Date of Birth/Sex: Treating RN: 02/16/1966 (55 y.o. Candace Robinson Primary Care Raelee Rossmann: [name] Other Clinician: Referring Fayelynn Distel: Treating Aikam Hellickson/Extender: Darcey Nora in Treatment: 9 Active Problems Location of Pain Severity and Description of Pain Patient Has Paino Yes Site Locations Pain Location: Pain Location: Pain in Ulcers With Dressing Change: Yes Duration of the Pain. Constant / Intermittento Intermittent Rate the pain. Current Pain Level: 4 Character of Pain Describe the Pain: Burning, Throbbing Pain Management and Medication Current Pain Management: Medication: Yes Cold Application: No Rest: No Massage: No Activity: No T.E.N.S.: No Heat Application: No Leg drop or elevation: No Is the Current Pain Management Adequate: Adequate How does your wound impact your activities of daily livingo Sleep: No Bathing: No Appetite: No Relationship With Others: No Bladder Continence: No Emotions: No Bowel Continence: No Work: No Toileting:  No Drive: No Dressing: No Hobbies: No Electronic Signature(s) Signed:  04/26/2021 5:18:13 PM By: Levan Hurst RN, BSN Entered By: Levan Hurst on 04/26/2021 15:08:49 -------------------------------------------------------------------------------- Patient/Caregiver Education Details Patient Name: Date of Service: Candace A [name], [name] 10/24/2022andnbsp3:15 PM Medical Record Number: [id number] Patient Account Number: [id number] Date of Birth/Gender: Treating RN: 23-Jun-1966 (55 y.o. Candace Robinson Primary Care Physician: [name] Other Clinician: Referring Physician: Treating Physician/Extender: Darcey Nora in Treatment: 9 Education Assessment Education Provided To: Patient Education Topics Provided Wound/Skin Impairment: Methods: Explain/Verbal Responses: State content correctly [employer]) Signed: 04/26/2021 5:18:13 PM By: Levan Hurst RN, BSN Entered By: Levan Hurst on 04/26/2021 15:36:10 -------------------------------------------------------------------------------- Wound Assessment Details Patient Name: Date of Service: Candace Robinson, [name] 04/26/2021 3:15 PM Medical Record Number: [id number] Patient Account Number: [id number] Date of Birth/Sex: Treating RN: 13-Aug-1965 (55 y.o. Candace Robinson Primary Care Loy Little: [name] Other Clinician: Referring Charlyne Robertshaw: Treating Melesio Madara/Extender: [name] Weeks in Treatment: 9 Wound Status Wound Number: 1 Primary Diabetic Wound/Ulcer of the Lower Extremity Etiology: Wound Location: Right, Medial Ankle Wound Open Wounding Event: Gradually Appeared Status: Date Acquired: 04/03/2020 Comorbid Anemia, Arrhythmia, Congestive Heart Failure, Hypertension, Weeks Of Treatment: 9 History: Type II Diabetes, Lupus Erythematosus Clustered Wound: No Photos Wound Measurements Length: (cm) 5.6 Width: (cm) 4.4 Depth: (cm) 0.3 Area: (cm) 19.352 Volume: (cm) 5.806 % Reduction in Area: -17.3% %  Reduction in Volume: -76% Epithelialization: Small (1-33%) Tunneling: No Undermining: No Wound Description Classification: Grade 2 Wound Margin: Distinct, outline attached Exudate Amount: Medium Exudate Type: Serosanguineous Exudate Color: red, brown Foul Odor After Cleansing: No Slough/Fibrino Yes Wound Bed Granulation Amount: Small (1-33%) Exposed Structure Granulation Quality: Red Fascia Exposed: No Necrotic Amount: Large (67-100%) Fat Layer (Subcutaneous Tissue) Exposed: Yes Necrotic Quality: Adherent Slough Tendon Exposed: No Muscle Exposed: No Joint Exposed: No Bone Exposed: No Treatment Notes Wound #1 (Ankle) Wound Laterality: Right, Medial Cleanser Soap and Water Discharge Instruction: May shower and wash wound with dial antibacterial soap and water prior to dressing change. Wound Cleanser Discharge Instruction: Cleanse the wound with wound cleanser prior to applying a clean dressing using gauze sponges, not tissue or cotton balls. Peri-Wound Care Topical Primary Dressing KerraCel Ag Gelling Fiber Dressing, 4x5 in (silver alginate) Discharge Instruction: Apply silver alginate over Santyl Santyl Ointment Discharge Instruction: Apply nickel thick amount to wound bed as instructed Secondary Dressing ABD Pad, 8x10 Discharge Instruction: Apply over primary dressing as directed. NonWoven Sponge, 4x4 (in/in) Discharge Instruction: Apply over primary dressing as directed Secured With Elastic Bandage 4 inch (ACE bandage) Discharge Instruction: Secure with ACE bandage as directed. Kerlix Roll Sterile, 4.5x3.1 (in/yd) Discharge Instruction: Secure with Kerlix as directed. Transpore Surgical Tape, 2x10 (in/yd) Discharge Instruction: Secure dressing with tape as directed. Compression Wrap Compression Stockings Add-Ons Electronic Signature(s) Signed: 04/26/2021 5:18:13 PM By: Levan Hurst RN, BSN Entered By: Levan Hurst on 04/26/2021  15:19:19 -------------------------------------------------------------------------------- Wound Assessment Details Patient Name: Date of Service: Candace Robinson, [name] 04/26/2021 3:15 PM Medical Record Number: [id number] Patient Account Number: [id number] Date of Birth/Sex: Treating RN: [birth year] (55 y.o. Candace Robinson Primary Care Quetzally Callas: [name] Other Clinician: Referring Miranda Garber: Treating Odetta Forness/Extender: [name] Weeks in Treatment: 9 Wound Status Wound Number: 2 Primary Diabetic Wound/Ulcer of the Lower Extremity Etiology: Wound Location: Right, Dorsal Foot Wound Open Wounding Event: Gradually Appeared Status: Date Acquired: 04/03/2020 Comorbid Anemia, Arrhythmia, Congestive Heart Failure, Hypertension, Weeks Of  Treatment: 9 History: Type II Diabetes, Lupus Erythematosus Clustered Wound: No Photos Wound Measurements Length: (cm) 6 Width: (cm) 5.5 Depth: (cm) 0.1 Area: (cm) 25.918 Volume: (cm) 2.592 % Reduction in Area: -120% % Reduction in Volume: -120% Epithelialization: Small (1-33%) Tunneling: No Undermining: No Wound Description Classification: Grade 2 Wound Margin: Distinct, outline attached Exudate Amount: Medium Exudate Type: Serosanguineous Exudate Color: red, brown Foul Odor After Cleansing: No Slough/Fibrino Yes Wound Bed Granulation Amount: Small (1-33%) Exposed Structure Granulation Quality: Red Fascia Exposed: No Necrotic Amount: Large (67-100%) Fat Layer (Subcutaneous Tissue) Exposed: Yes Necrotic Quality: Adherent Slough Tendon Exposed: No Muscle Exposed: No Joint Exposed: No Bone Exposed: No Treatment Notes Wound #2 (Foot) Wound Laterality: Dorsal, Right Cleanser Soap and Water Discharge Instruction: May shower and wash wound with dial antibacterial soap and water prior to dressing change. Wound Cleanser Discharge Instruction: Cleanse the wound with wound cleanser prior to applying a  clean dressing using gauze sponges, not tissue or cotton balls. Peri-Wound Care Topical Primary Dressing KerraCel Ag Gelling Fiber Dressing, 4x5 in (silver alginate) Discharge Instruction: Apply silver alginate over Santyl Santyl Ointment Discharge Instruction: Apply nickel thick amount to wound bed as instructed Secondary Dressing ABD Pad, 8x10 Discharge Instruction: Apply over primary dressing as directed. NonWoven Sponge, 4x4 (in/in) Discharge Instruction: Apply over primary dressing as directed Secured With Elastic Bandage 4 inch (ACE bandage) Discharge Instruction: Secure with ACE bandage as directed. Kerlix Roll Sterile, 4.5x3.1 (in/yd) Discharge Instruction: Secure with Kerlix as directed. Transpore Surgical Tape, 2x10 (in/yd) Discharge Instruction: Secure dressing with tape as directed. Compression Wrap Compression Stockings Add-Ons Electronic Signature(s) Signed: 04/26/2021 5:18:13 PM By: Levan Hurst RN, BSN Entered By: Levan Hurst on 04/26/2021 15:21:29 -------------------------------------------------------------------------------- Wound Assessment Details Patient Name: Date of Service: Candace Robinson, [name] 04/26/2021 3:15 PM Medical Record Number: [id number] Patient Account Number: [id number] Date of Birth/Sex: Treating RN: 08/25/1965 (55 y.o. Candace Robinson Primary Care Krystyne Tewksbury: [name] Other Clinician: Referring Bernisha Verma: Treating Tranisha Tissue/Extender: [name] Weeks in Treatment: 9 Wound Status Wound Number: 3 Primary Diabetic Wound/Ulcer of the Lower Extremity Etiology: Wound Location: Left, Medial Ankle Wound Open Wounding Event: Gradually Appeared Status: Date Acquired: 02/22/2021 Comorbid Anemia, Arrhythmia, Congestive Heart Failure, Hypertension, Weeks Of Treatment: 9 History: Type II Diabetes, Lupus Erythematosus Clustered Wound: No Photos Wound Measurements Length: (cm) 1 Width: (cm) 2.3 Depth:  (cm) 0.2 Area: (cm) 1.806 Volume: (cm) 0.361 % Reduction in Area: 8% % Reduction in Volume: 8.1% Epithelialization: Medium (34-66%) Tunneling: No Undermining: No Wound Description Classification: Grade 2 Wound Margin: Distinct, outline attached Exudate Amount: Medium Exudate Type: Serosanguineous Exudate Color: red, brown Foul Odor After Cleansing: No Slough/Fibrino Yes Wound Bed Granulation Amount: Large (67-100%) Exposed Structure Granulation Quality: Red Fascia Exposed: No Necrotic Amount: Small (1-33%) Fat Layer (Subcutaneous Tissue) Exposed: Yes Necrotic Quality: Adherent Slough Tendon Exposed: No Muscle Exposed: No Joint Exposed: No Bone Exposed: No Treatment Notes Wound #3 (Ankle) Wound Laterality: Left, Medial Cleanser Soap and Water Discharge Instruction: May shower and wash wound with dial antibacterial soap and water prior to dressing change. Wound Cleanser Discharge Instruction: Cleanse the wound with wound cleanser prior to applying a clean dressing using gauze sponges, not tissue or cotton balls. Peri-Wound Care Topical Primary Dressing KerraCel Ag Gelling Fiber Dressing, 4x5 in (silver alginate) Discharge Instruction: Apply silver alginate over Santyl Santyl Ointment Discharge Instruction: Apply nickel thick amount to wound bed as instructed Secondary Dressing ABD Pad, 8x10 Discharge Instruction: Apply over primary dressing as  directed. NonWoven Sponge, 4x4 (in/in) Discharge Instruction: Apply over primary dressing as directed Secured With Elastic Bandage 4 inch (ACE bandage) Discharge Instruction: Secure with ACE bandage as directed. Kerlix Roll Sterile, 4.5x3.1 (in/yd) Discharge Instruction: Secure with Kerlix as directed. Transpore Surgical Tape, 2x10 (in/yd) Discharge Instruction: Secure dressing with tape as directed. Compression Wrap Compression Stockings Add-Ons Electronic Signature(s) Signed: 04/26/2021 5:18:13 PM By: Levan Hurst  RN, BSN Entered By: Levan Hurst on 04/26/2021 15:24:38 -------------------------------------------------------------------------------- Wound Assessment Details Patient Name: Date of Service: Candace Robinson, [name] 04/26/2021 3:15 PM Medical Record Number: [id number] Patient Account Number: [id number] Date of Birth/Sex: Treating RN: 1966-01-12 (55 y.o. Candace Robinson Primary Care Dyanara Cozza: [name] Other Clinician: Referring Leora Platt: Treating Malorie Bigford/Extender: [name] Weeks in Treatment: 9 Wound Status Wound Number: 4 Primary Diabetic Wound/Ulcer of the Lower Extremity Etiology: Wound Location: Left, Lateral Ankle Wound Open Wounding Event: Gradually Appeared Status: Date Acquired: 04/03/2020 Comorbid Anemia, Arrhythmia, Congestive Heart Failure, Hypertension, Weeks Of Treatment: 9 History: Type II Diabetes, Lupus Erythematosus Clustered Wound: No Photos Wound Measurements Length: (cm) 8 Width: (cm) 5.6 Depth: (cm) 0.2 Area: (cm) 35.186 Volume: (cm) 7.037 % Reduction in Area: 6.7% % Reduction in Volume: 6.7% Epithelialization: Small (1-33%) Tunneling: No Undermining: No Wound Description Classification: Grade 2 Wound Margin: Distinct, outline attached Exudate Amount: Medium Exudate Type: Serosanguineous Exudate Color: red, brown Foul Odor After Cleansing: No Slough/Fibrino Yes Wound Bed Granulation Amount: Medium (34-66%) Exposed Structure Granulation Quality: Red Fascia Exposed: No Necrotic Amount: Medium (34-66%) Fat Layer (Subcutaneous Tissue) Exposed: Yes Necrotic Quality: Adherent Slough Tendon Exposed: No Muscle Exposed: No Joint Exposed: No Bone Exposed: No Treatment Notes Wound #4 (Ankle) Wound Laterality: Left, Lateral Cleanser Soap and Water Discharge Instruction: May shower and wash wound with dial antibacterial soap and water prior to dressing change. Wound Cleanser Discharge Instruction: Cleanse  the wound with wound cleanser prior to applying a clean dressing using gauze sponges, not tissue or cotton balls. Peri-Wound Care Topical Primary Dressing KerraCel Ag Gelling Fiber Dressing, 4x5 in (silver alginate) Discharge Instruction: Apply silver alginate over Santyl Santyl Ointment Discharge Instruction: Apply nickel thick amount to wound bed as instructed Secondary Dressing ABD Pad, 8x10 Discharge Instruction: Apply over primary dressing as directed. NonWoven Sponge, 4x4 (in/in) Discharge Instruction: Apply over primary dressing as directed Secured With Elastic Bandage 4 inch (ACE bandage) Discharge Instruction: Secure with ACE bandage as directed. Kerlix Roll Sterile, 4.5x3.1 (in/yd) Discharge Instruction: Secure with Kerlix as directed. Transpore Surgical Tape, 2x10 (in/yd) Discharge Instruction: Secure dressing with tape as directed. Compression Wrap Compression Stockings Add-Ons Electronic Signature(s) Signed: 04/26/2021 5:18:13 PM By: Levan Hurst RN, BSN Entered By: Levan Hurst on 04/26/2021 15:22:21 -------------------------------------------------------------------------------- Wound Assessment Details Patient Name: Date of Service: Candace Robinson, [name] 04/26/2021 3:15 PM Medical Record Number: [id number] Patient Account Number: [id number] Date of Birth/Sex: Treating RN: Jan 23, 1966 (55 y.o. Candace Robinson Primary Care James Lafalce: [name] Other Clinician: Referring Jlynn Ly: Treating Iyona Pehrson/Extender: [name] Weeks in Treatment: 9 Wound Status Wound Number: 5 Primary Diabetic Wound/Ulcer of the Lower Extremity Etiology: Wound Location: Left, Dorsal Foot Wound Open Wounding Event: Gradually Appeared Status: Date Acquired: 04/03/2020 Comorbid Anemia, Arrhythmia, Congestive Heart Failure, Hypertension, Weeks Of Treatment: 9 History: Type II Diabetes, Lupus Erythematosus Clustered Wound: No Photos Wound  Measurements Length: (cm) 10 Width: (cm) 9.5 Depth: (cm) 0.2 Area: (cm) 74.613 Volume: (cm) 14.923 % Reduction in Area: -24.2% % Reduction in Volume: -148.4% Epithelialization:  None Tunneling: No Undermining: No Wound Description Classification: Grade 2 Wound Margin: Distinct, outline attached Exudate Amount: Medium Exudate Type: Serosanguineous Exudate Color: red, brown Foul Odor After Cleansing: No Slough/Fibrino Yes Wound Bed Granulation Amount: Small (1-33%) Exposed Structure Granulation Quality: Red Fascia Exposed: No Necrotic Amount: Large (67-100%) Fat Layer (Subcutaneous Tissue) Exposed: Yes Necrotic Quality: Adherent Slough Tendon Exposed: No Muscle Exposed: No Joint Exposed: No Bone Exposed: No Treatment Notes Wound #5 (Foot) Wound Laterality: Dorsal, Left Cleanser Soap and Water Discharge Instruction: May shower and wash wound with dial antibacterial soap and water prior to dressing change. Wound Cleanser Discharge Instruction: Cleanse the wound with wound cleanser prior to applying a clean dressing using gauze sponges, not tissue or cotton balls. Peri-Wound Care Topical Primary Dressing KerraCel Ag Gelling Fiber Dressing, 4x5 in (silver alginate) Discharge Instruction: Apply silver alginate over Santyl Santyl Ointment Discharge Instruction: Apply nickel thick amount to wound bed as instructed Secondary Dressing ABD Pad, 8x10 Discharge Instruction: Apply over primary dressing as directed. NonWoven Sponge, 4x4 (in/in) Discharge Instruction: Apply over primary dressing as directed Secured With Elastic Bandage 4 inch (ACE bandage) Discharge Instruction: Secure with ACE bandage as directed. Kerlix Roll Sterile, 4.5x3.1 (in/yd) Discharge Instruction: Secure with Kerlix as directed. Transpore Surgical Tape, 2x10 (in/yd) Discharge Instruction: Secure dressing with tape as directed. Compression Wrap Compression Stockings Add-Ons Electronic  Signature(s) Signed: 04/26/2021 5:18:13 PM By: Levan Hurst RN, BSN Entered By: Levan Hurst on 04/26/2021 15:25:40 -------------------------------------------------------------------------------- Wound Assessment Details Patient Name: Date of Service: Candace Robinson, [name] 04/26/2021 3:15 PM Medical Record Number: [id number] Patient Account Number: [id number] Date of Birth/Sex: Treating RN: 1966/05/21 (55 y.o. Candace Robinson Primary Care Jemar Paulsen: [name] Other Clinician: Referring Denine Brotz: Treating Sariya Trickey/Extender: [name] Weeks in Treatment: 9 Wound Status Wound Number: 6 Primary Diabetic Wound/Ulcer of the Lower Extremity Etiology: Wound Location: Left, Distal, Medial Foot Wound Open Wounding Event: Gradually Appeared Status: Date Acquired: 04/03/2020 Comorbid Anemia, Arrhythmia, Congestive Heart Failure, Hypertension, Weeks Of Treatment: 9 History: Type II Diabetes, Lupus Erythematosus Clustered Wound: No Photos Wound Measurements Length: (cm) 0.5 Width: (cm) 0.5 Depth: (cm) 0.1 Area: (cm) 0.196 Volume: (cm) 0.02 % Reduction in Area: 37.6% % Reduction in Volume: 35.5% Epithelialization: Medium (34-66%) Tunneling: No Undermining: No Wound Description Classification: Grade 2 Wound Margin: Distinct, outline attached Exudate Amount: Medium Exudate Type: Serosanguineous Exudate Color: red, brown Foul Odor After Cleansing: No Slough/Fibrino Yes Wound Bed Granulation Amount: Large (67-100%) Exposed Structure Granulation Quality: Red, Pink Fascia Exposed: No Necrotic Amount: Small (1-33%) Fat Layer (Subcutaneous Tissue) Exposed: Yes Necrotic Quality: Adherent Slough Tendon Exposed: No Muscle Exposed: No Joint Exposed: No Bone Exposed: No Treatment Notes Wound #6 (Foot) Wound Laterality: Left, Medial, Distal Cleanser Soap and Water Discharge Instruction: May shower and wash wound with dial antibacterial soap  and water prior to dressing change. Wound Cleanser Discharge Instruction: Cleanse the wound with wound cleanser prior to applying a clean dressing using gauze sponges, not tissue or cotton balls. Peri-Wound Care Topical Primary Dressing KerraCel Ag Gelling Fiber Dressing, 4x5 in (silver alginate) Discharge Instruction: Apply silver alginate over Santyl Santyl Ointment Discharge Instruction: Apply nickel thick amount to wound bed as instructed Secondary Dressing ABD Pad, 8x10 Discharge Instruction: Apply over primary dressing as directed. NonWoven Sponge, 4x4 (in/in) Discharge Instruction: Apply over primary dressing as directed Secured With Elastic Bandage 4 inch (ACE bandage) Discharge Instruction: Secure with ACE bandage as directed. Kerlix Roll Sterile, 4.5x3.1 (in/yd) Discharge Instruction: Secure with  Kerlix as directed. Transpore Surgical Tape, 2x10 (in/yd) Discharge Instruction: Secure dressing with tape as directed. Compression Wrap Compression Stockings Add-Ons Electronic Signature(s) Signed: 04/26/2021 5:18:13 PM By: Levan Hurst RN, BSN Entered By: Levan Hurst on 04/26/2021 15:23:34 -------------------------------------------------------------------------------- Wound Assessment Details Patient Name: Date of Service: Candace Robinson, [name] 04/26/2021 3:15 PM Medical Record Number: [id number] Patient Account Number: [id number] Date of Birth/Sex: Treating RN: 05/10/1966 (55 y.o. Candace Robinson Primary Care Ryun Velez: [name] Other Clinician: Referring Tymeer Vaquera: Treating Seiya Silsby/Extender: [name] Weeks in Treatment: 9 Wound Status Wound Number: 7 Primary Lupus Etiology: Wound Location: Right, Lateral Ankle Wound Open Wounding Event: Gradually Appeared Status: Date Acquired: 04/08/2021 Comorbid Anemia, Arrhythmia, Congestive Heart Failure, Hypertension, Weeks Of Treatment: 2 History: Type II Diabetes, Lupus  Erythematosus Clustered Wound: No Photos Wound Measurements Length: (cm) 0.5 Width: (cm) 0.8 Depth: (cm) 0.1 Area: (cm) 0.314 Volume: (cm) 0.031 % Reduction in Area: -912.9% % Reduction in Volume: -933.3% Epithelialization: None Tunneling: No Undermining: No Wound Description Classification: Full Thickness Without Exposed Support Structures Wound Margin: Distinct, outline attached Exudate Amount: Medium Exudate Type: Serosanguineous Exudate Color: red, brown Foul Odor After Cleansing: No Slough/Fibrino Yes Wound Bed Granulation Amount: Small (1-33%) Exposed Structure Granulation Quality: Pink Fascia Exposed: No Necrotic Amount: Large (67-100%) Fat Layer (Subcutaneous Tissue) Exposed: Yes Necrotic Quality: Adherent Slough Tendon Exposed: No Muscle Exposed: No Joint Exposed: No Bone Exposed: No Treatment Notes Wound #7 (Ankle) Wound Laterality: Right, Lateral Cleanser Soap and Water Discharge Instruction: May shower and wash wound with dial antibacterial soap and water prior to dressing change. Wound Cleanser Discharge Instruction: Cleanse the wound with wound cleanser prior to applying a clean dressing using gauze sponges, not tissue or cotton balls. Peri-Wound Care Topical Primary Dressing KerraCel Ag Gelling Fiber Dressing, 4x5 in (silver alginate) Discharge Instruction: Apply silver alginate over Santyl Santyl Ointment Discharge Instruction: Apply nickel thick amount to wound bed as instructed Secondary Dressing ABD Pad, 8x10 Discharge Instruction: Apply over primary dressing as directed. NonWoven Sponge, 4x4 (in/in) Discharge Instruction: Apply over primary dressing as directed Secured With Elastic Bandage 4 inch (ACE bandage) Discharge Instruction: Secure with ACE bandage as directed. Kerlix Roll Sterile, 4.5x3.1 (in/yd) Discharge Instruction: Secure with Kerlix as directed. Transpore Surgical Tape, 2x10 (in/yd) Discharge Instruction: Secure dressing  with tape as directed. Compression Wrap Compression Stockings Add-Ons Electronic Signature(s) Signed: 04/26/2021 5:18:13 PM By: Levan Hurst RN, BSN Entered By: Levan Hurst on 04/26/2021 15:20:31 -------------------------------------------------------------------------------- Vitals Details Patient Name: Date of Service: Candace Robinson, [name] 04/26/2021 3:15 PM Medical Record Number: [id number] Patient Account Number: [id number] Date of Birth/Sex: Treating RN: Mar 02, 1966 (55 y.o. Candace Robinson Primary Care Armida Vickroy: [name] Other Clinician: Referring Damin Salido: Treating Zauria Dombek/Extender: [name] Weeks in Treatment: 9 Vital Signs Time Taken: 15:07 Temperature (F): 98.2 Height (in): 63 Pulse (bpm): 88 Weight (lbs): 157 Respiratory Rate (breaths/min): 16 Body Mass Index (BMI): 27.8 Blood Pressure (mmHg): 121/78 Reference Range: 80 - 120 mg / dl Electronic Signature(s) Signed: 04/26/2021 5:18:13 PM By: Levan Hurst RN, BSN Entered By: Levan Hurst on 04/26/2021 15:08:22

## 2021-05-10 ENCOUNTER — Other Ambulatory Visit: Payer: Self-pay

## 2021-05-10 ENCOUNTER — Encounter (HOSPITAL_BASED_OUTPATIENT_CLINIC_OR_DEPARTMENT_OTHER): Payer: Medicaid Other | Admitting: Internal Medicine

## 2021-05-10 DIAGNOSIS — I110 Hypertensive heart disease with heart failure: Secondary | ICD-10-CM | POA: Diagnosis not present

## 2021-05-10 DIAGNOSIS — E1151 Type 2 diabetes mellitus with diabetic peripheral angiopathy without gangrene: Secondary | ICD-10-CM | POA: Diagnosis not present

## 2021-05-10 DIAGNOSIS — I4891 Unspecified atrial fibrillation: Secondary | ICD-10-CM | POA: Diagnosis not present

## 2021-05-10 DIAGNOSIS — L97522 Non-pressure chronic ulcer of other part of left foot with fat layer exposed: Secondary | ICD-10-CM | POA: Diagnosis not present

## 2021-05-10 DIAGNOSIS — L97512 Non-pressure chronic ulcer of other part of right foot with fat layer exposed: Secondary | ICD-10-CM | POA: Insufficient documentation

## 2021-05-10 DIAGNOSIS — E11621 Type 2 diabetes mellitus with foot ulcer: Secondary | ICD-10-CM | POA: Insufficient documentation

## 2021-05-10 DIAGNOSIS — I5032 Chronic diastolic (congestive) heart failure: Secondary | ICD-10-CM | POA: Diagnosis not present

## 2021-05-10 DIAGNOSIS — L932 Other local lupus erythematosus: Secondary | ICD-10-CM | POA: Insufficient documentation

## 2021-05-10 NOTE — Progress Notes (Addendum)
MADELAINE, WHIPPLE ([id number]) Visit Report for 05/10/2021 Arrival Information Details Patient Name: Date of Service: [name], [name] 05/10/2021 3:15 PM Medical Record Number: [id number] Patient Account Number: [id number] Date of Birth/Sex: Treating RN: 30-May-1966 (55 y.o. [name] Primary Care Elbert Spickler: [name] Other Clinician: Referring Shahram Alexopoulos: Treating Kenli Waldo/Extender: Darcey Nora in Treatment: 11 Visit Information History Since Last Visit Added or deleted any medications: No Patient Arrived: [name] Any new allergies or adverse reactions: No Arrival Time: 15:18 Had a fall or experienced change in No Accompanied By: self activities of daily living that may affect Transfer Assistance: None risk of falls: Patient Requires Transmission-Based No Signs or symptoms of abuse/neglect since last visito No Precautions: Hospitalized since last visit: No Patient Has Alerts: Yes Implantable device outside of the clinic excluding No Patient Alerts: R ABI: 1.0 L ABI: 1.1 cellular tissue based products placed in the center NO BENZOCAINE since last visit: USE LIDOCAINE GEL Pain Present Now: Yes ONLY Electronic Signature(s) Signed: 05/10/2021 4:58:23 PM By: Dellie Catholic RN Entered By: Dellie Catholic on 05/10/2021 15:19:39 -------------------------------------------------------------------------------- Encounter Discharge Information Details Patient Name: Date of Service: [name], [name] 05/10/2021 3:15 PM Medical Record Number: [id number] Patient Account Number: [id number] Date of Birth/Sex: Treating RN: May 07, 1966 (55 y.o. [name] Primary Care Kalayna Noy: [name] Other Clinician: Referring Jackelyn Illingworth: Treating Drue Camera/Extender: Darcey Nora in Treatment: 11 Encounter Discharge Information Items Post Procedure Vitals Discharge Condition: Stable Temperature (F):  99.2 Ambulatory Status: Stretcher Pulse (bpm): 93 Discharge Destination: Home Respiratory Rate (breaths/min): 18 Transportation: Other Blood Pressure (mmHg): 118/80 Schedule Follow-up Appointment: Yes Clinical Summary of Care: Provided on 05/10/2021 Form Type Recipient Paper Patient Patient Electronic Signature(s) Signed: 05/10/2021 5:20:47 PM By: Lorrin Jackson Entered By: Lorrin Jackson on 05/10/2021 16:54:01 -------------------------------------------------------------------------------- Lower Extremity Assessment Details Patient Name: Date of Service: [name], [name] 05/10/2021 3:15 PM Medical Record Number: [id number] Patient Account Number: [id number] Date of Birth/Sex: Treating RN: [birth year] (55 y.o. [name] Primary Care Maitland Lesiak: [name] Other Clinician: Referring Xolani Degracia: Treating Mariko Nowakowski/Extender: [name] Weeks in Treatment: 11 Edema Assessment Assessed: [name]: Yes] [name]: Yes] Edema: [Left: Yes] [Right: Yes] Calf Left: Right: Point of Measurement: 29 cm From Medial Instep 34 cm 32 cm Ankle Left: Right: Point of Measurement: 10 cm From Medial Instep 23 cm 23 cm Vascular Assessment Pulses: Dorsalis Pedis Palpable: [Left:Yes] [Right:Yes] Electronic Signature(s) Signed: 05/10/2021 5:20:47 PM By: Lorrin Jackson Entered By: Lorrin Jackson on 05/10/2021 16:04:38 -------------------------------------------------------------------------------- Multi Wound Chart Details Patient Name: Date of Service: [name], [name] 05/10/2021 3:15 PM Medical Record Number: [id number] Patient Account Number: [id number] Date of Birth/Sex: Treating RN: September 09, 1965 (55 y.o. [name] Primary Care Clavin Ruhlman: [name] Other Clinician: Referring Margherita Collyer: Treating Shelaine Frie/Extender: [name] Weeks in Treatment: 11 Vital Signs Height(in): 33 Pulse(bpm): 30 Weight(lbs): 157 Blood  Pressure(mmHg): 118/80 Body Mass Index(BMI): 28 Temperature(F): 99.2 Respiratory Rate(breaths/min): 18 Photos: [1:Right, Medial Ankle] [2:Right, Dorsal Foot] [3:Left, Medial Ankle] Wound Location: [1:Gradually Appeared] [2:Gradually Appeared] [3:Gradually Appeared] Wounding Event: [1:Diabetic Wound/Ulcer of the Lower] [2:Diabetic Wound/Ulcer of the Lower] [3:Diabetic Wound/Ulcer of the Lower] Primary Etiology: [1:Extremity Anemia, Arrhythmia, Congestive Heart Anemia, Arrhythmia, Congestive Heart Anemia, Arrhythmia, Congestive Heart] [2:Extremity] [3:Extremity] Comorbid History: [1:Failure, Hypertension, Type II Diabetes, Lupus Erythematosus 04/03/2020] [2:Failure, Hypertension, Type II Diabetes, Lupus Erythematosus 04/03/2020] [3:Failure, Hypertension, Type II Diabetes, Lupus Erythematosus 02/22/2021] Date  A cquired: [1:11] [2:11] [3:11] Weeks of Treatment: [1:Open] [2:Open] [3:Open] Wound Status: [1:6x5.2x0.2] [2:5.5x5.5x0.1] [3:0.8x2.1x0.1] Measurements L x W x D (cm) [1:24.504] [2:23.758] [3:1.319] A (cm) : rea [1:4.901] [2:2.376] [3:0.132] Volume (cm) : [1:-48.60%] [2:-101.70%] [3:32.80%] % Reduction in A [1:rea: -48.60%] [2:-101.70%] [3:66.40%] % Reduction in Volume: [1:Grade 2] [2:Grade 2] [3:Grade 2] Classification: [1:Medium] [2:Medium] [3:Medium] Exudate A mount: [1:Serosanguineous] [2:Serosanguineous] [3:Serosanguineous] Exudate Type: [1:red, brown] [2:red, brown] [3:red, brown] Exudate Color: [1:Distinct, outline attached] [2:Distinct, outline attached] [3:Distinct, outline attached] Wound Margin: [1:Small (1-33%)] [2:Small (1-33%)] [3:Large (67-100%)] Granulation A mount: [1:Red] [2:Red] [3:Red] Granulation Quality: [1:Large (67-100%)] [2:Large (67-100%)] [3:Small (1-33%)] Necrotic A mount: [1:Fat Layer (Subcutaneous Tissue): Yes Fat Layer (Subcutaneous Tissue): Yes Fat Layer (Subcutaneous Tissue): Yes] Exposed Structures: [1:Fascia: No Tendon: No Muscle: No Joint: No Bone:  No Small (1-33%)] [2:Fascia: No Tendon: No Muscle: No Joint: No Bone: No Small (1-33%)] [3:Fascia: No Tendon: No Muscle: No Joint: No Bone: No Medium (34-66%)] Epithelialization: [1:N/A] [2:N/A] [3:N/A] Debridement: [1:N/A] [2:N/A] [3:N/A] Pain Control: [1:N/A] [2:N/A] [3:N/A] Tissue Debrided: [1:N/A] [2:N/A] [3:N/A] Level: [1:N/A] [2:N/A] [3:N/A] Debridement A (sq cm): [1:rea N/A] [2:N/A] [3:N/A] Instrument: [1:N/A] [2:N/A] [3:N/A] Bleeding: [1:N/A] [2:N/A] [3:N/A] Hemostasis A chieved: Debridement Treatment Response: N/A [2:N/A] [3:N/A] Post Debridement Measurements L x N/A [2:N/A] [3:N/A] W x D (cm) [1:N/A] [2:N/A] [3:N/A] Post Debridement Volume: (cm) [1:N/A] [2:N/A] [3:N/A] Wound Number: 4 5 6  Photos: Left, Lateral Ankle Left, Dorsal Foot Left, Distal, Medial Foot Wound Location: Gradually Appeared Gradually Appeared Gradually Appeared Wounding Event: Diabetic Wound/Ulcer of the Lower Diabetic Wound/Ulcer of the Lower Diabetic Wound/Ulcer of the Lower Primary Etiology: Extremity Extremity Extremity Anemia, Arrhythmia, Congestive Heart Anemia, Arrhythmia, Congestive Heart Anemia, Arrhythmia, Congestive Heart Comorbid History: Failure, Hypertension, Type II Failure, Hypertension, Type II Failure, Hypertension, Type II Diabetes, Lupus Erythematosus Diabetes, Lupus Erythematosus Diabetes, Lupus Erythematosus 04/03/2020 04/03/2020 04/03/2020 Date Acquired: 11 11 11  Weeks of Treatment: Open Open Open Wound Status: 7.5x5.5x0.2 9.5x7.5x0.2 0.5x0.5x1 Measurements L x W x D (cm) 32.398 55.96 0.196 A (cm) : rea 6.48 11.192 0.196 Volume (cm) : 14.10% 6.90% 37.60% % Reduction in A rea: 14.10% -86.30% -532.30% % Reduction in Volume: Grade 2 Grade 2 Grade 2 Classification: Medium Medium Medium Exudate A mount: Serosanguineous Serosanguineous Serosanguineous Exudate Type: red, brown red, brown red, brown Exudate Color: Distinct, outline attached Distinct, outline attached  Distinct, outline attached Wound Margin: Medium (34-66%) Small (1-33%) Large (67-100%) Granulation A mount: Red Red Red, Pink Granulation Quality: Medium (34-66%) Large (67-100%) Small (1-33%) Necrotic A mount: Fat Layer (Subcutaneous Tissue): Yes Fat Layer (Subcutaneous Tissue): Yes Fat Layer (Subcutaneous Tissue): Yes Exposed Structures: Fascia: No Fascia: No Fascia: No Tendon: No Tendon: No Tendon: No Muscle: No Muscle: No Muscle: No Joint: No Joint: No Joint: No Bone: No Bone: No Bone: No Small (1-33%) None Medium (34-66%) Epithelialization: N/A Debridement - Excisional N/A Debridement: N/A 15:59 N/A Pre-procedure Verification/Time Out Taken: N/A Lidocaine 5% topical ointment N/A Pain Control: N/A Subcutaneous, Slough N/A Tissue Debrided: N/A Skin/Subcutaneous Tissue N/A Level: N/A 15 N/A Debridement A (sq cm): rea N/A Curette N/A Instrument: N/A Minimum N/A Bleeding: N/A Pressure N/A Hemostasis A chieved: N/A Procedure was tolerated well N/A Debridement Treatment Response: N/A 9.5x7.5x0.2 N/A Post Debridement Measurements L x W x D (cm) N/A 11.192 N/A Post Debridement Volume: (cm) N/A Biopsy N/A Procedures Performed: Debridement Wound Number: 7 N/A N/A Photos: N/A N/A Right, Lateral Ankle N/A N/A Wound Location: Gradually Appeared N/A N/A Wounding Event: Lupus N/A N/A Primary Etiology: Anemia, Arrhythmia,  Congestive Heart N/A N/A Comorbid History: Failure, Hypertension, Type II Diabetes, Lupus Erythematosus 04/08/2021 N/A N/A Date A cquired: 4 N/A N/A Weeks of Treatment: Open N/A N/A Wound Status: 1x1.1x0.1 N/A N/A Measurements L x W x D (cm) 0.864 N/A N/A A (cm) : rea 0.086 N/A N/A Volume (cm) : -2687.10% N/A N/A % Reduction in A rea: -2766.70% N/A N/A % Reduction in Volume: Full Thickness Without Exposed N/A N/A Classification: Support Structures Medium N/A N/A Exudate A mount: Serosanguineous N/A N/A Exudate  Type: red, brown N/A N/A Exudate Color: Distinct, outline attached N/A N/A Wound Margin: Small (1-33%) N/A N/A Granulation A mount: Pink N/A N/A Granulation Quality: Large (67-100%) N/A N/A Necrotic A mount: Fat Layer (Subcutaneous Tissue): Yes N/A N/A Exposed Structures: Fascia: No Tendon: No Muscle: No Joint: No Bone: No None N/A N/A Epithelialization: N/A N/A N/A Debridement: N/A N/A N/A Pain Control: N/A N/A N/A Tissue Debrided: N/A N/A N/A Level: N/A N/A N/A Debridement A (sq cm): rea N/A N/A N/A Instrument: N/A N/A N/A Bleeding: N/A N/A N/A Hemostasis A chieved: Debridement Treatment Response: N/A N/A N/A Post Debridement Measurements L x N/A N/A N/A W x D (cm) N/A N/A N/A Post Debridement Volume: (cm) N/A N/A N/A Procedures Performed: Treatment Notes Wound #1 (Ankle) Wound Laterality: Right, Medial Cleanser Soap and Water Discharge Instruction: May shower and wash wound with dial antibacterial soap and water prior to dressing change. Wound Cleanser Discharge Instruction: Cleanse the wound with wound cleanser prior to applying a clean dressing using gauze sponges, not tissue or cotton balls. Peri-Wound Care Topical Primary Dressing KerraCel Ag Gelling Fiber Dressing, 4x5 in (silver alginate) Discharge Instruction: Apply silver alginate over Santyl Santyl Ointment Discharge Instruction: Apply nickel thick amount to wound bed as instructed Secondary Dressing ABD Pad, 8x10 Discharge Instruction: Apply over primary dressing as directed. NonWoven Sponge, 4x4 (in/in) Discharge Instruction: Apply over primary dressing as directed Secured With Elastic Bandage 4 inch (ACE bandage) Discharge Instruction: Secure with ACE bandage as directed. Kerlix Roll Sterile, 4.5x3.1 (in/yd) Discharge Instruction: Secure with Kerlix as directed. Transpore Surgical Tape, 2x10 (in/yd) Discharge Instruction: Secure dressing with tape as directed. Compression  Wrap Compression Stockings Add-Ons Wound #2 (Foot) Wound Laterality: Dorsal, Right Cleanser Soap and Water Discharge Instruction: May shower and wash wound with dial antibacterial soap and water prior to dressing change. Wound Cleanser Discharge Instruction: Cleanse the wound with wound cleanser prior to applying a clean dressing using gauze sponges, not tissue or cotton balls. Peri-Wound Care Topical Primary Dressing KerraCel Ag Gelling Fiber Dressing, 4x5 in (silver alginate) Discharge Instruction: Apply silver alginate over Santyl Santyl Ointment Discharge Instruction: Apply nickel thick amount to wound bed as instructed Secondary Dressing ABD Pad, 8x10 Discharge Instruction: Apply over primary dressing as directed. NonWoven Sponge, 4x4 (in/in) Discharge Instruction: Apply over primary dressing as directed Secured With Elastic Bandage 4 inch (ACE bandage) Discharge Instruction: Secure with ACE bandage as directed. Kerlix Roll Sterile, 4.5x3.1 (in/yd) Discharge Instruction: Secure with Kerlix as directed. Transpore Surgical Tape, 2x10 (in/yd) Discharge Instruction: Secure dressing with tape as directed. Compression Wrap Compression Stockings Add-Ons Wound #3 (Ankle) Wound Laterality: Left, Medial Cleanser Soap and Water Discharge Instruction: May shower and wash wound with dial antibacterial soap and water prior to dressing change. Wound Cleanser Discharge Instruction: Cleanse the wound with wound cleanser prior to applying a clean dressing using gauze sponges, not tissue or cotton balls. Peri-Wound Care Topical Primary Dressing KerraCel Ag Gelling Fiber Dressing, 4x5 in (silver alginate) Discharge Instruction: Apply  silver alginate over Santyl Santyl Ointment Discharge Instruction: Apply nickel thick amount to wound bed as instructed Secondary Dressing ABD Pad, 8x10 Discharge Instruction: Apply over primary dressing as directed. NonWoven Sponge, 4x4  (in/in) Discharge Instruction: Apply over primary dressing as directed Secured With Elastic Bandage 4 inch (ACE bandage) Discharge Instruction: Secure with ACE bandage as directed. Kerlix Roll Sterile, 4.5x3.1 (in/yd) Discharge Instruction: Secure with Kerlix as directed. Transpore Surgical Tape, 2x10 (in/yd) Discharge Instruction: Secure dressing with tape as directed. Compression Wrap Compression Stockings Add-Ons Wound #4 (Ankle) Wound Laterality: Left, Lateral Cleanser Soap and Water Discharge Instruction: May shower and wash wound with dial antibacterial soap and water prior to dressing change. Wound Cleanser Discharge Instruction: Cleanse the wound with wound cleanser prior to applying a clean dressing using gauze sponges, not tissue or cotton balls. Peri-Wound Care Topical Primary Dressing KerraCel Ag Gelling Fiber Dressing, 4x5 in (silver alginate) Discharge Instruction: Apply silver alginate over Santyl Santyl Ointment Discharge Instruction: Apply nickel thick amount to wound bed as instructed Secondary Dressing ABD Pad, 8x10 Discharge Instruction: Apply over primary dressing as directed. NonWoven Sponge, 4x4 (in/in) Discharge Instruction: Apply over primary dressing as directed Secured With Elastic Bandage 4 inch (ACE bandage) Discharge Instruction: Secure with ACE bandage as directed. Kerlix Roll Sterile, 4.5x3.1 (in/yd) Discharge Instruction: Secure with Kerlix as directed. Transpore Surgical Tape, 2x10 (in/yd) Discharge Instruction: Secure dressing with tape as directed. Compression Wrap Compression Stockings Add-Ons Wound #5 (Foot) Wound Laterality: Dorsal, Left Cleanser Soap and Water Discharge Instruction: May shower and wash wound with dial antibacterial soap and water prior to dressing change. Wound Cleanser Discharge Instruction: Cleanse the wound with wound cleanser prior to applying a clean dressing using gauze sponges, not tissue or cotton  balls. Peri-Wound Care Topical Primary Dressing KerraCel Ag Gelling Fiber Dressing, 4x5 in (silver alginate) Discharge Instruction: Apply silver alginate over Santyl Santyl Ointment Discharge Instruction: Apply nickel thick amount to wound bed as instructed Secondary Dressing ABD Pad, 8x10 Discharge Instruction: Apply over primary dressing as directed. NonWoven Sponge, 4x4 (in/in) Discharge Instruction: Apply over primary dressing as directed Secured With Elastic Bandage 4 inch (ACE bandage) Discharge Instruction: Secure with ACE bandage as directed. Kerlix Roll Sterile, 4.5x3.1 (in/yd) Discharge Instruction: Secure with Kerlix as directed. Transpore Surgical Tape, 2x10 (in/yd) Discharge Instruction: Secure dressing with tape as directed. Compression Wrap Compression Stockings Add-Ons Wound #6 (Foot) Wound Laterality: Left, Medial, Distal Cleanser Soap and Water Discharge Instruction: May shower and wash wound with dial antibacterial soap and water prior to dressing change. Wound Cleanser Discharge Instruction: Cleanse the wound with wound cleanser prior to applying a clean dressing using gauze sponges, not tissue or cotton balls. Peri-Wound Care Topical Primary Dressing KerraCel Ag Gelling Fiber Dressing, 4x5 in (silver alginate) Discharge Instruction: Apply silver alginate over Santyl Santyl Ointment Discharge Instruction: Apply nickel thick amount to wound bed as instructed Secondary Dressing ABD Pad, 8x10 Discharge Instruction: Apply over primary dressing as directed. NonWoven Sponge, 4x4 (in/in) Discharge Instruction: Apply over primary dressing as directed Secured With Elastic Bandage 4 inch (ACE bandage) Discharge Instruction: Secure with ACE bandage as directed. Kerlix Roll Sterile, 4.5x3.1 (in/yd) Discharge Instruction: Secure with Kerlix as directed. Transpore Surgical Tape, 2x10 (in/yd) Discharge Instruction: Secure dressing with tape as  directed. Compression Wrap Compression Stockings Add-Ons Wound #7 (Ankle) Wound Laterality: Right, Lateral Cleanser Soap and Water Discharge Instruction: May shower and wash wound with dial antibacterial soap and water prior to dressing change. Wound Cleanser Discharge Instruction: Cleanse the wound with wound  cleanser prior to applying a clean dressing using gauze sponges, not tissue or cotton balls. Peri-Wound Care Topical Primary Dressing KerraCel Ag Gelling Fiber Dressing, 4x5 in (silver alginate) Discharge Instruction: Apply silver alginate over Santyl Santyl Ointment Discharge Instruction: Apply nickel thick amount to wound bed as instructed Secondary Dressing ABD Pad, 8x10 Discharge Instruction: Apply over primary dressing as directed. NonWoven Sponge, 4x4 (in/in) Discharge Instruction: Apply over primary dressing as directed Secured With Elastic Bandage 4 inch (ACE bandage) Discharge Instruction: Secure with ACE bandage as directed. Kerlix Roll Sterile, 4.5x3.1 (in/yd) Discharge Instruction: Secure with Kerlix as directed. Transpore Surgical Tape, 2x10 (in/yd) Discharge Instruction: Secure dressing with tape as directed. Compression Wrap Compression Stockings Add-Ons Electronic Signature(s) Signed: 05/11/2021 8:20:00 AM By: Kalman Shan DO Signed: 05/11/2021 4:51:17 PM By: Lorrin Jackson Entered By: Kalman Shan on 05/11/2021 08:09:05 -------------------------------------------------------------------------------- Multi-Disciplinary Care Plan Details Patient Name: Date of Service: [name], [name] 05/10/2021 3:15 PM Medical Record Number: [id number] Patient Account Number: [id number] Date of Birth/Sex: Treating RN: October 06, 1965 (55 y.o. [name] Primary Care Maizy Davanzo: [name] Other Clinician: Referring Nester Bachus: Treating Kail Fraley/Extender: Darcey Nora in Treatment: 11 Active Inactive Wound/Skin  Impairment Nursing Diagnoses: Impaired tissue integrity Goals: Patient/caregiver will verbalize understanding of skin care regimen Date Initiated: 02/22/2021 Target Resolution Date: 05/28/2021 Goal Status: Active Interventions: Assess patient/caregiver ability to obtain necessary supplies Assess patient/caregiver ability to perform ulcer/skin care regimen upon admission and as needed Assess ulceration(s) every visit Provide education on ulcer and skin care Treatment Activities: Topical wound management initiated : 02/22/2021 Notes: 03/25/21: All wounds not yet at 30%, culture positive. Using gent cream. Electronic Signature(s) Signed: 05/10/2021 3:28:33 PM By: Lorrin Jackson Entered By: Lorrin Jackson on 05/10/2021 15:28:31 -------------------------------------------------------------------------------- Pain Assessment Details Patient Name: Date of Service: [name], [name] 05/10/2021 3:15 PM Medical Record Number: [id number] Patient Account Number: [id number] Date of Birth/Sex: Treating RN: [birth year] (55 y.o. [name] Primary Care Satvik Parco: [name] Other Clinician: Referring Donivin Wirt: Treating Dailey Alberson/Extender: Darcey Nora in Treatment: 11 Active Problems Location of Pain Severity and Description of Pain Patient Has Paino Yes Site Locations Rate the pain. Current Pain Level: 8 Character of Pain Describe the Pain: Burning, Throbbing Pain Management and Medication Current Pain Management: Medication: Yes Cold Application: No Rest: Yes Massage: No Activity: No T.E.N.S.: No Heat Application: No Leg drop or elevation: No Is the Current Pain Management Adequate: Adequate How does your wound impact your activities of daily livingo Sleep: Yes Bathing: No Appetite: No Relationship With Others: No Bladder Continence: No Emotions: No Bowel Continence: No Work: No Toileting: No Drive: No Dressing: No Hobbies:  No Electronic Signature(s) Signed: 05/10/2021 4:58:23 PM By: Dellie Catholic RN Signed: 05/10/2021 5:20:47 PM By: Lorrin Jackson Entered By: Dellie Catholic on 05/10/2021 15:25:20 -------------------------------------------------------------------------------- Patient/Caregiver Education Details Patient Name: Date of Service: [name], [name] 11/7/2022andnbsp3:15 PM Medical Record Number: [id number] Patient Account Number: [id number] Date of Birth/Gender: Treating RN: [birth year] (55 y.o. [name] Primary Care Physician: [name] Other Clinician: Referring Physician: Treating Physician/Extender: Darcey Nora in Treatment: 11 Education Assessment Education Provided To: Patient Education Topics Provided Wound/Skin Impairment: Methods: Demonstration, Explain/Verbal, Printed Responses: State content correctly [employer]) Signed: 05/10/2021 5:20:47 PM By: Lorrin Jackson Entered By: Lorrin Jackson on 05/10/2021 15:29:06 -------------------------------------------------------------------------------- Wound Assessment Details Patient Name: Date of Service: [name], [name] 05/10/2021 3:15 PM  Medical Record Number: [id number] Patient Account Number: [id number] Date of Birth/Sex: Treating RN: May 13, 1966 (55 y.o. [name] Primary Care Phares Zaccone: [name] Other Clinician: Referring Orpah Hausner: Treating Shauntea Lok/Extender: [name] Weeks in Treatment: 11 Wound Status Wound Number: 1 Primary Diabetic Wound/Ulcer of the Lower Extremity Etiology: Wound Location: Right, Medial Ankle Wound Open Wounding Event: Gradually Appeared Status: Date Acquired: 04/03/2020 Comorbid Anemia, Arrhythmia, Congestive Heart Failure, Hypertension, Weeks Of Treatment: 11 History: Type II Diabetes, Lupus Erythematosus Clustered Wound: No Photos Wound Measurements Length: (cm) 6 Width: (cm)  5.2 Depth: (cm) 0.2 Area: (cm) 24.504 Volume: (cm) 4.901 % Reduction in Area: -48.6% % Reduction in Volume: -48.6% Epithelialization: Small (1-33%) Tunneling: No Undermining: No Wound Description Classification: Grade 2 Wound Margin: Distinct, outline attached Exudate Amount: Medium Exudate Type: Serosanguineous Exudate Color: red, brown Foul Odor After Cleansing: No Slough/Fibrino Yes Wound Bed Granulation Amount: Small (1-33%) Exposed Structure Granulation Quality: Red Fascia Exposed: No Necrotic Amount: Large (67-100%) Fat Layer (Subcutaneous Tissue) Exposed: Yes Necrotic Quality: Adherent Slough Tendon Exposed: No Muscle Exposed: No Joint Exposed: No Bone Exposed: No Treatment Notes Wound #1 (Ankle) Wound Laterality: Right, Medial Cleanser Soap and Water Discharge Instruction: May shower and wash wound with dial antibacterial soap and water prior to dressing change. Wound Cleanser Discharge Instruction: Cleanse the wound with wound cleanser prior to applying a clean dressing using gauze sponges, not tissue or cotton balls. Peri-Wound Care Topical Primary Dressing KerraCel Ag Gelling Fiber Dressing, 4x5 in (silver alginate) Discharge Instruction: Apply silver alginate over Santyl Santyl Ointment Discharge Instruction: Apply nickel thick amount to wound bed as instructed Secondary Dressing ABD Pad, 8x10 Discharge Instruction: Apply over primary dressing as directed. NonWoven Sponge, 4x4 (in/in) Discharge Instruction: Apply over primary dressing as directed Secured With Elastic Bandage 4 inch (ACE bandage) Discharge Instruction: Secure with ACE bandage as directed. Kerlix Roll Sterile, 4.5x3.1 (in/yd) Discharge Instruction: Secure with Kerlix as directed. Transpore Surgical Tape, 2x10 (in/yd) Discharge Instruction: Secure dressing with tape as directed. Compression Wrap Compression Stockings Add-Ons Electronic Signature(s) Signed: 05/10/2021 5:20:47 PM By:  Lorrin Jackson Signed: 05/10/2021 5:27:57 PM By: Deon Pilling RN, BSN Entered By: Deon Pilling on 05/10/2021 15:44:11 -------------------------------------------------------------------------------- Wound Assessment Details Patient Name: Date of Service: [name], [name] 05/10/2021 3:15 PM Medical Record Number: [id number] Patient Account Number: [id number] Date of Birth/Sex: Treating RN: 12/21/1965 (55 y.o. [name] Primary Care Dalton Mille: [name] Other Clinician: Referring Cormick Moss: Treating Jacere Pangborn/Extender: [name] Weeks in Treatment: 11 Wound Status Wound Number: 2 Primary Diabetic Wound/Ulcer of the Lower Extremity Etiology: Wound Location: Right, Dorsal Foot Wound Open Wounding Event: Gradually Appeared Status: Date Acquired: 04/03/2020 Comorbid Anemia, Arrhythmia, Congestive Heart Failure, Hypertension, Weeks Of Treatment: 11 History: Type II Diabetes, Lupus Erythematosus Clustered Wound: No Photos Wound Measurements Length: (cm) 5.5 Width: (cm) 5.5 Depth: (cm) 0.1 Area: (cm) 23.758 Volume: (cm) 2.376 % Reduction in Area: -101.7% % Reduction in Volume: -101.7% Epithelialization: Small (1-33%) Tunneling: No Undermining: No Wound Description Classification: Grade 2 Wound Margin: Distinct, outline attached Exudate Amount: Medium Exudate Type: Serosanguineous Exudate Color: red, brown Foul Odor After Cleansing: No Slough/Fibrino Yes Wound Bed Granulation Amount: Small (1-33%) Exposed Structure Granulation Quality: Red Fascia Exposed: No Necrotic Amount: Large (67-100%) Fat Layer (Subcutaneous Tissue) Exposed: Yes Necrotic Quality: Adherent Slough Tendon Exposed: No Muscle Exposed: No Joint Exposed: No Bone Exposed: No Treatment Notes Wound #2 (Foot) Wound Laterality: Dorsal, Right Cleanser Soap and Water Discharge Instruction: May shower and  wash wound with dial antibacterial soap and water prior  to dressing change. Wound Cleanser Discharge Instruction: Cleanse the wound with wound cleanser prior to applying a clean dressing using gauze sponges, not tissue or cotton balls. Peri-Wound Care Topical Primary Dressing KerraCel Ag Gelling Fiber Dressing, 4x5 in (silver alginate) Discharge Instruction: Apply silver alginate over Santyl Santyl Ointment Discharge Instruction: Apply nickel thick amount to wound bed as instructed Secondary Dressing ABD Pad, 8x10 Discharge Instruction: Apply over primary dressing as directed. NonWoven Sponge, 4x4 (in/in) Discharge Instruction: Apply over primary dressing as directed Secured With Elastic Bandage 4 inch (ACE bandage) Discharge Instruction: Secure with ACE bandage as directed. Kerlix Roll Sterile, 4.5x3.1 (in/yd) Discharge Instruction: Secure with Kerlix as directed. Transpore Surgical Tape, 2x10 (in/yd) Discharge Instruction: Secure dressing with tape as directed. Compression Wrap Compression Stockings Add-Ons Electronic Signature(s) Signed: 05/10/2021 5:20:47 PM By: Lorrin Jackson Signed: 05/10/2021 5:27:57 PM By: Deon Pilling RN, BSN Entered By: Deon Pilling on 05/10/2021 15:43:34 -------------------------------------------------------------------------------- Wound Assessment Details Patient Name: Date of Service: [name], [name] 05/10/2021 3:15 PM Medical Record Number: [id number] Patient Account Number: [id number] Date of Birth/Sex: Treating RN: September 15, 1965 (55 y.o. [name] Primary Care Rockwell Zentz: [name] Other Clinician: Referring Yvana Samonte: Treating Albertina Leise/Extender: [name] Weeks in Treatment: 11 Wound Status Wound Number: 3 Primary Diabetic Wound/Ulcer of the Lower Extremity Etiology: Wound Location: Left, Medial Ankle Wound Open Wounding Event: Gradually Appeared Status: Date Acquired: 02/22/2021 Comorbid Anemia, Arrhythmia, Congestive Heart Failure,  Hypertension, Weeks Of Treatment: 11 History: Type II Diabetes, Lupus Erythematosus Clustered Wound: No Photos Wound Measurements Length: (cm) 0.8 Width: (cm) 2.1 Depth: (cm) 0.1 Area: (cm) 1.319 Volume: (cm) 0.132 % Reduction in Area: 32.8% % Reduction in Volume: 66.4% Epithelialization: Medium (34-66%) Tunneling: No Undermining: No Wound Description Classification: Grade 2 Wound Margin: Distinct, outline attached Exudate Amount: Medium Exudate Type: Serosanguineous Exudate Color: red, brown Foul Odor After Cleansing: No Slough/Fibrino Yes Wound Bed Granulation Amount: Large (67-100%) Exposed Structure Granulation Quality: Red Fascia Exposed: No Necrotic Amount: Small (1-33%) Fat Layer (Subcutaneous Tissue) Exposed: Yes Necrotic Quality: Adherent Slough Tendon Exposed: No Muscle Exposed: No Joint Exposed: No Bone Exposed: No Treatment Notes Wound #3 (Ankle) Wound Laterality: Left, Medial Cleanser Soap and Water Discharge Instruction: May shower and wash wound with dial antibacterial soap and water prior to dressing change. Wound Cleanser Discharge Instruction: Cleanse the wound with wound cleanser prior to applying a clean dressing using gauze sponges, not tissue or cotton balls. Peri-Wound Care Topical Primary Dressing KerraCel Ag Gelling Fiber Dressing, 4x5 in (silver alginate) Discharge Instruction: Apply silver alginate over Santyl Santyl Ointment Discharge Instruction: Apply nickel thick amount to wound bed as instructed Secondary Dressing ABD Pad, 8x10 Discharge Instruction: Apply over primary dressing as directed. NonWoven Sponge, 4x4 (in/in) Discharge Instruction: Apply over primary dressing as directed Secured With Elastic Bandage 4 inch (ACE bandage) Discharge Instruction: Secure with ACE bandage as directed. Kerlix Roll Sterile, 4.5x3.1 (in/yd) Discharge Instruction: Secure with Kerlix as directed. Transpore Surgical Tape, 2x10  (in/yd) Discharge Instruction: Secure dressing with tape as directed. Compression Wrap Compression Stockings Add-Ons Electronic Signature(s) Signed: 05/10/2021 5:20:47 PM By: Lorrin Jackson Signed: 05/10/2021 5:27:57 PM By: Deon Pilling RN, BSN Entered By: Deon Pilling on 05/10/2021 15:42:25 -------------------------------------------------------------------------------- Wound Assessment Details Patient Name: Date of Service: [name], [name] 05/10/2021 3:15 PM Medical Record Number: [id number] Patient Account Number: [id number] Date of Birth/Sex: Treating RN: 1966/03/18 (55 y.o. [name]  Primary Care Raeshaun Simson: [name] Other Clinician: Referring Dex Blakely: Treating Belma Dyches/Extender: [name] Weeks in Treatment: 11 Wound Status Wound Number: 4 Primary Diabetic Wound/Ulcer of the Lower Extremity Etiology: Wound Location: Left, Lateral Ankle Wound Open Wounding Event: Gradually Appeared Status: Date Acquired: 04/03/2020 Comorbid Anemia, Arrhythmia, Congestive Heart Failure, Hypertension, Weeks Of Treatment: 11 History: Type II Diabetes, Lupus Erythematosus Clustered Wound: No Photos Wound Measurements Length: (cm) 7.5 Width: (cm) 5.5 Depth: (cm) 0.2 Area: (cm) 32.398 Volume: (cm) 6.48 % Reduction in Area: 14.1% % Reduction in Volume: 14.1% Epithelialization: Small (1-33%) Tunneling: No Undermining: No Wound Description Classification: Grade 2 Wound Margin: Distinct, outline attached Exudate Amount: Medium Exudate Type: Serosanguineous Exudate Color: red, brown Foul Odor After Cleansing: No Slough/Fibrino Yes Wound Bed Granulation Amount: Medium (34-66%) Exposed Structure Granulation Quality: Red Fascia Exposed: No Necrotic Amount: Medium (34-66%) Fat Layer (Subcutaneous Tissue) Exposed: Yes Necrotic Quality: Adherent Slough Tendon Exposed: No Muscle Exposed: No Joint Exposed: No Bone Exposed:  No Treatment Notes Wound #4 (Ankle) Wound Laterality: Left, Lateral Cleanser Soap and Water Discharge Instruction: May shower and wash wound with dial antibacterial soap and water prior to dressing change. Wound Cleanser Discharge Instruction: Cleanse the wound with wound cleanser prior to applying a clean dressing using gauze sponges, not tissue or cotton balls. Peri-Wound Care Topical Primary Dressing KerraCel Ag Gelling Fiber Dressing, 4x5 in (silver alginate) Discharge Instruction: Apply silver alginate over Santyl Santyl Ointment Discharge Instruction: Apply nickel thick amount to wound bed as instructed Secondary Dressing ABD Pad, 8x10 Discharge Instruction: Apply over primary dressing as directed. NonWoven Sponge, 4x4 (in/in) Discharge Instruction: Apply over primary dressing as directed Secured With Elastic Bandage 4 inch (ACE bandage) Discharge Instruction: Secure with ACE bandage as directed. Kerlix Roll Sterile, 4.5x3.1 (in/yd) Discharge Instruction: Secure with Kerlix as directed. Transpore Surgical Tape, 2x10 (in/yd) Discharge Instruction: Secure dressing with tape as directed. Compression Wrap Compression Stockings Add-Ons Electronic Signature(s) Signed: 05/10/2021 5:20:47 PM By: Lorrin Jackson Signed: 05/10/2021 5:27:57 PM By: Deon Pilling RN, BSN Entered By: Deon Pilling on 05/10/2021 15:41:46 -------------------------------------------------------------------------------- Wound Assessment Details Patient Name: Date of Service: [name], [name] 05/10/2021 3:15 PM Medical Record Number: [id number] Patient Account Number: [id number] Date of Birth/Sex: Treating RN: Feb 02, 1966 (55 y.o. [name] Primary Care Ravenne Wayment: [name] Other Clinician: Referring Carold Eisner: Treating Arian Mcquitty/Extender: [name] Weeks in Treatment: 11 Wound Status Wound Number: 5 Primary Diabetic Wound/Ulcer of the Lower  Extremity Etiology: Wound Location: Left, Dorsal Foot Wound Open Wounding Event: Gradually Appeared Status: Date Acquired: 04/03/2020 Comorbid Anemia, Arrhythmia, Congestive Heart Failure, Hypertension, Weeks Of Treatment: 11 History: Type II Diabetes, Lupus Erythematosus Clustered Wound: No Photos Wound Measurements Length: (cm) 9.5 Width: (cm) 7.5 Depth: (cm) 0.2 Area: (cm) 55.96 Volume: (cm) 11.192 % Reduction in Area: 6.9% % Reduction in Volume: -86.3% Epithelialization: None Tunneling: No Undermining: No Wound Description Classification: Grade 2 Wound Margin: Distinct, outline attached Exudate Amount: Medium Exudate Type: Serosanguineous Exudate Color: red, brown Foul Odor After Cleansing: No Slough/Fibrino Yes Wound Bed Granulation Amount: Small (1-33%) Exposed Structure Granulation Quality: Red Fascia Exposed: No Necrotic Amount: Large (67-100%) Fat Layer (Subcutaneous Tissue) Exposed: Yes Tendon Exposed: No Muscle Exposed: No Joint Exposed: No Bone Exposed: No Treatment Notes Wound #5 (Foot) Wound Laterality: Dorsal, Left Cleanser Soap and Water Discharge Instruction: May shower and wash wound with dial antibacterial soap and water prior to dressing change. Wound Cleanser Discharge Instruction: Cleanse the wound with wound cleanser prior to  applying a clean dressing using gauze sponges, not tissue or cotton balls. Peri-Wound Care Topical Primary Dressing KerraCel Ag Gelling Fiber Dressing, 4x5 in (silver alginate) Discharge Instruction: Apply silver alginate over Santyl Santyl Ointment Discharge Instruction: Apply nickel thick amount to wound bed as instructed Secondary Dressing ABD Pad, 8x10 Discharge Instruction: Apply over primary dressing as directed. NonWoven Sponge, 4x4 (in/in) Discharge Instruction: Apply over primary dressing as directed Secured With Elastic Bandage 4 inch (ACE bandage) Discharge Instruction: Secure with ACE bandage as  directed. Kerlix Roll Sterile, 4.5x3.1 (in/yd) Discharge Instruction: Secure with Kerlix as directed. Transpore Surgical Tape, 2x10 (in/yd) Discharge Instruction: Secure dressing with tape as directed. Compression Wrap Compression Stockings Add-Ons Electronic Signature(s) Signed: 05/10/2021 5:20:47 PM By: Lorrin Jackson Signed: 05/10/2021 5:27:57 PM By: Deon Pilling RN, BSN Entered By: Deon Pilling on 05/10/2021 15:41:04 -------------------------------------------------------------------------------- Wound Assessment Details Patient Name: Date of Service: [name], [name] 05/10/2021 3:15 PM Medical Record Number: [id number] Patient Account Number: [id number] Date of Birth/Sex: Treating RN: 01/01/1966 (55 y.o. [name] Primary Care Meta Kroenke: [name] Other Clinician: Referring Vandella Ord: Treating Licet Dunphy/Extender: [name] Weeks in Treatment: 11 Wound Status Wound Number: 6 Primary Diabetic Wound/Ulcer of the Lower Extremity Etiology: Wound Location: Left, Distal, Medial Foot Wound Open Wounding Event: Gradually Appeared Status: Date Acquired: 04/03/2020 Comorbid Anemia, Arrhythmia, Congestive Heart Failure, Hypertension, Weeks Of Treatment: 11 History: Type II Diabetes, Lupus Erythematosus Clustered Wound: No Photos Wound Measurements Length: (cm) 0.5 Width: (cm) 0.5 Depth: (cm) 1 Area: (cm) 0.196 Volume: (cm) 0.196 % Reduction in Area: 37.6% % Reduction in Volume: -532.3% Epithelialization: Medium (34-66%) Tunneling: No Undermining: No Wound Description Classification: Grade 2 Wound Margin: Distinct, outline attached Exudate Amount: Medium Exudate Type: Serosanguineous Exudate Color: red, brown Foul Odor After Cleansing: No Slough/Fibrino Yes Wound Bed Granulation Amount: Large (67-100%) Exposed Structure Granulation Quality: Red, Pink Fascia Exposed: No Necrotic Amount: Small (1-33%) Fat Layer  (Subcutaneous Tissue) Exposed: Yes Necrotic Quality: Adherent Slough Tendon Exposed: No Muscle Exposed: No Joint Exposed: No Bone Exposed: No Treatment Notes Wound #6 (Foot) Wound Laterality: Left, Medial, Distal Cleanser Soap and Water Discharge Instruction: May shower and wash wound with dial antibacterial soap and water prior to dressing change. Wound Cleanser Discharge Instruction: Cleanse the wound with wound cleanser prior to applying a clean dressing using gauze sponges, not tissue or cotton balls. Peri-Wound Care Topical Primary Dressing KerraCel Ag Gelling Fiber Dressing, 4x5 in (silver alginate) Discharge Instruction: Apply silver alginate over Santyl Santyl Ointment Discharge Instruction: Apply nickel thick amount to wound bed as instructed Secondary Dressing ABD Pad, 8x10 Discharge Instruction: Apply over primary dressing as directed. NonWoven Sponge, 4x4 (in/in) Discharge Instruction: Apply over primary dressing as directed Secured With Elastic Bandage 4 inch (ACE bandage) Discharge Instruction: Secure with ACE bandage as directed. Kerlix Roll Sterile, 4.5x3.1 (in/yd) Discharge Instruction: Secure with Kerlix as directed. Transpore Surgical Tape, 2x10 (in/yd) Discharge Instruction: Secure dressing with tape as directed. Compression Wrap Compression Stockings Add-Ons Electronic Signature(s) Signed: 05/10/2021 5:20:47 PM By: Lorrin Jackson Signed: 05/10/2021 5:27:57 PM By: Deon Pilling RN, BSN Entered By: Deon Pilling on 05/10/2021 15:40:29 -------------------------------------------------------------------------------- Wound Assessment Details Patient Name: Date of Service: [name], [name] 05/10/2021 3:15 PM Medical Record Number: [id number] Patient Account Number: [id number] Date of Birth/Sex: Treating RN: October 26, 1965 (55 y.o. [name] Primary Care Urie Loughner: [name] Other Clinician: Referring Shenica Holzheimer: Treating Dvon Jiles/Extender:  [name] Weeks in Treatment: 11 Wound Status  Wound Number: 7 Primary Lupus Etiology: Wound Location: Right, Lateral Ankle Wound Open Wounding Event: Gradually Appeared Status: Date Acquired: 04/08/2021 Comorbid Anemia, Arrhythmia, Congestive Heart Failure, Hypertension, Weeks Of Treatment: 4 History: Type II Diabetes, Lupus Erythematosus Clustered Wound: No Photos Wound Measurements Length: (cm) 1 Width: (cm) 1.1 Depth: (cm) 0.1 Area: (cm) 0.864 Volume: (cm) 0.086 % Reduction in Area: -2687.1% % Reduction in Volume: -2766.7% Epithelialization: None Tunneling: No Undermining: No Wound Description Classification: Full Thickness Without Exposed Support Structures Wound Margin: Distinct, outline attached Exudate Amount: Medium Exudate Type: Serosanguineous Exudate Color: red, brown Foul Odor After Cleansing: No Slough/Fibrino Yes Wound Bed Granulation Amount: Small (1-33%) Exposed Structure Granulation Quality: Pink Fascia Exposed: No Necrotic Amount: Large (67-100%) Fat Layer (Subcutaneous Tissue) Exposed: Yes Necrotic Quality: Adherent Slough Tendon Exposed: No Muscle Exposed: No Joint Exposed: No Bone Exposed: No Treatment Notes Wound #7 (Ankle) Wound Laterality: Right, Lateral Cleanser Soap and Water Discharge Instruction: May shower and wash wound with dial antibacterial soap and water prior to dressing change. Wound Cleanser Discharge Instruction: Cleanse the wound with wound cleanser prior to applying a clean dressing using gauze sponges, not tissue or cotton balls. Peri-Wound Care Topical Primary Dressing KerraCel Ag Gelling Fiber Dressing, 4x5 in (silver alginate) Discharge Instruction: Apply silver alginate over Santyl Santyl Ointment Discharge Instruction: Apply nickel thick amount to wound bed as instructed Secondary Dressing ABD Pad, 8x10 Discharge Instruction: Apply over primary dressing as directed. NonWoven Sponge,  4x4 (in/in) Discharge Instruction: Apply over primary dressing as directed Secured With Elastic Bandage 4 inch (ACE bandage) Discharge Instruction: Secure with ACE bandage as directed. Kerlix Roll Sterile, 4.5x3.1 (in/yd) Discharge Instruction: Secure with Kerlix as directed. Transpore Surgical Tape, 2x10 (in/yd) Discharge Instruction: Secure dressing with tape as directed. Compression Wrap Compression Stockings Add-Ons Electronic Signature(s) Signed: 05/10/2021 5:20:47 PM By: Lorrin Jackson Signed: 05/10/2021 5:27:57 PM By: Deon Pilling RN, BSN Entered By: Deon Pilling on 05/10/2021 15:39:55 -------------------------------------------------------------------------------- Vitals Details Patient Name: Date of Service: [name], [name] 05/10/2021 3:15 PM Medical Record Number: [id number] Patient Account Number: [id number] Date of Birth/Sex: Treating RN: Jan 02, 1966 (55 y.o. [name] Primary Care Ashiya Kinkead: [name] Other Clinician: Referring Lory Nowaczyk: Treating Remiel Corti/Extender: Darcey Nora in Treatment: 11 Vital Signs Time Taken: 15:24 Temperature (F): 99.2 Height (in): 63 Pulse (bpm): 93 Weight (lbs): 157 Respiratory Rate (breaths/min): 18 Body Mass Index (BMI): 27.8 Blood Pressure (mmHg): 118/80 Reference Range: 80 - 120 mg / dl Electronic Signature(s) Signed: 05/10/2021 4:58:23 PM By: Dellie Catholic RN Entered By: Dellie Catholic on 05/10/2021 15:24:40

## 2021-05-11 NOTE — Progress Notes (Signed)
Candace Robinson, Candace Robinson ([id number]) Visit Report for 05/10/2021 Biopsy Details Patient Name: Date of Service: [name], [name] 05/10/2021 3:15 PM Medical Record Number: [id number] Patient Account Number: [id number] Date of Birth/Sex: Treating RN: 1965-09-29 (54 y.o. Candace Robinson Primary Care Provider: Leonie Robinson Other Clinician: Referring Provider: Treating Provider/Extender: Candace Robinson Weeks in Treatment: 11 Biopsy Performed for: Wound #5 Left, Dorsal Foot Performed By: Physician Candace Shan, DO Tissue Punch: Yes Size (mm): 4 Number of Specimens T aken: 2 Specimen Sent T Pathology: o Yes Level of Consciousness (Pre-procedure): Awake and Alert Pre-procedure Verification/Time-Out Taken: Yes - 16:00 Pain Control: Lidocaine Injectable Lidocaine Percent: 1% Bleeding: Minimum Hemostasis Achieved: Pressure Response to Treatment: Procedure was tolerated well Level of Consciousness (Post-procedure): Awake and Alert Post Procedure Diagnosis Same as Pre-procedure Electronic Signature(s) Signed: 05/10/2021 5:20:47 PM By: Lorrin Jackson Signed: 05/11/2021 8:20:00 AM By: Candace Shan DO Entered By: Lorrin Jackson on 05/10/2021 16:20:25 -------------------------------------------------------------------------------- Chief Complaint Document Details Patient Name: Date of Service: [name], [name] 05/10/2021 3:15 PM Medical Record Number: [id number] Patient Account Number: [id number] Date of Birth/Sex: Treating RN: 10-11-1965 (55 y.o. Candace Robinson Primary Care Provider: Leonie Robinson Other Clinician: Referring Provider: Treating Provider/Extender: Darcey Nora in Treatment: 11 Information Obtained from: Patient Chief Complaint Bilateral feet wounds Electronic Signature(s) Signed: 05/11/2021 8:20:00 AM By: Candace Shan DO Entered By: Candace Robinson on 05/11/2021  08:11:54 -------------------------------------------------------------------------------- Debridement Details Patient Name: Date of Service: [name], [name] 05/10/2021 3:15 PM Medical Record Number: [id number] Patient Account Number: [id number] Date of Birth/Sex: Treating RN: 1966-01-17 (55 y.o. Candace Robinson Primary Care Provider: Leonie Robinson Other Clinician: Referring Provider: Treating Provider/Extender: Darcey Nora in Treatment: 11 Debridement Performed for Assessment: Wound #5 Left,Dorsal Foot Performed By: Physician Candace Shan, DO Debridement Type: Debridement Severity of Tissue Pre Debridement: Fat layer exposed Level of Consciousness (Pre-procedure): Awake and Alert Pre-procedure Verification/Time Out Yes - 15:59 Taken: Start Time: 16:00 Pain Control: Lidocaine 5% topical ointment T Area Debrided (L x W): otal 5 (cm) x 3 (cm) = 15 (cm) Tissue and other material debrided: Non-Viable, Slough, Subcutaneous, Slough Level: Skin/Subcutaneous Tissue Debridement Description: Excisional Instrument: Curette Specimen: Tissue Culture Number of Specimens T aken: 1 Bleeding: Minimum Hemostasis Achieved: Pressure End Time: 16:17 Response to Treatment: Procedure was tolerated well Level of Consciousness (Post- Awake and Alert procedure): Post Debridement Measurements of Total Wound Length: (cm) 9.5 Width: (cm) 7.5 Depth: (cm) 0.2 Volume: (cm) 11.192 Character of Wound/Ulcer Post Debridement: Stable Severity of Tissue Post Debridement: Fat layer exposed Post Procedure Diagnosis Same as Pre-procedure Electronic Signature(s) Signed: 05/10/2021 5:20:47 PM By: Lorrin Jackson Signed: 05/11/2021 8:20:00 AM By: Candace Shan DO Entered By: Lorrin Jackson on 05/10/2021 16:19:16 -------------------------------------------------------------------------------- HPI Details Patient Name: Date of Service: [name], [name]  05/10/2021 3:15 PM Medical Record Number: [id number] Patient Account Number: [id number] Date of Birth/Sex: Treating RN: 12-Nov-1965 (55 y.o. Candace Robinson Primary Care Provider: Leonie Robinson Other Clinician: Referring Provider: Treating Provider/Extender: Darcey Nora in Treatment: 11 History of Present Illness HPI Description: Admission 8/22 Ms. [name] is a 55 year old female with a past medical history of diet-controlled type 2 diabetes, hypothyroidism, chronic diastolic heart failure, cutaneous lupus erythematosus and essential hypertension that presents to the clinic for a 98-month history of worsening bilateral feet ulcers. She states that wounds to her feet have been an  ongoing issue for several years. She states that with wound care they improve however they tend to wax and wane. 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 She was discharged to a nursing facility and she was provided wound care for her bilateral feet ulcers at that time. She states they used Silvadene and silver alginate. She states that when she was discharged in April from the nursing facility her wounds had improved but were not fully closed. She currently uses Silvadene but has not been using silver alginate for several months due to running out. She reports significant decline in her wound healing over the last 4 months. She reports pain to the wound sites. She currently denies increased warmth erythema or purulent drainage. 8/29; patient presents for 1 week follow-up. She states she had ABIs completed without TBI's. She continues to use silver alginate with dressing changes. She reports continued pain. She denies signs of infection. 9/8; patient presents for 1 week follow-up. She was evaluated by Dr. Donzetta Matters and she reports follow-up with him in 1 month. She has been using Santyl to the wound beds. She reports some improvement in wound healing. She  denies signs of infection. 9/15; patient presents for follow-up. She continues to use Santyl and silver alginate to the wound beds. She states the alginate is sticking and hard to remove with dressing changes. She currently denies signs of infection. 9/22; patient presents for 1 week follow-up. She is using Santyl and silver alginate to the wound beds. She ran out of Jasper and has been using Silvadene. She states that gentamicin ointment is ready at her pharmacy and will be picking this up today. She has no issues or complaints today. She denies signs of infection. 9/30; she is using Santyl and silver alginate. She has substantial wounds on her bilateral feet and bilateral ankles that have been there for a year. She is in a lot of pain. She cannot bear to have these wounds touched let alone a mechanical debridement here. Furthermore the etiology of this is really not totally clear. She had reasonably normal ABIs with triphasic waveforms in August [I did not review this however]. She is completing a course of ciprofloxacin for an area on the left lateral ankle. She has had prior ablations and follows with vascular surgery. Finally she apparently has lupus 10/6; patient has been using Silvadene to the wound beds with dressing changes. She states she is picking up Santyl today. She has no issues or complaints today. She denies signs of infection. 10/17 the patient managed to get Santyl which the she is using to all wound beds. There is a large areas of breakdown here including substantially on the left distal foot, right foot involving the dorsal toes both medial ankles. All of these wounds are covered with some degree of very adherent fibrinous material. The patient did has an appointment with vascular surgery Dr. Donzetta Matters in 2 days time 10/24; patient presents for follow-up. She has been using Santyl and silver alginate to the wound beds. She states that she is going to be set up with rheumatology soon.  She denies signs of infection. She overall reports stability in her wound healing. 11/7; patient presents for follow-up. She continues to use Santyl and silver alginate to the wound beds. She has an appointment set to see rheumatology on 07/06/2021. She reports some improvement in her wound healing. She currently denies signs of infection. Electronic Signature(s) Signed: 05/11/2021 8:20:00 AM By: Candace Shan DO Entered  By: Candace Robinson on 05/11/2021 08:13:08 -------------------------------------------------------------------------------- Physical Exam Details Patient Name: Date of Service: [name], [name] 05/10/2021 3:15 PM Medical Record Number: [id number] Patient Account Number: [id number] Date of Birth/Sex: Treating RN: 04-20-1966 (55 y.o. Candace Robinson Primary Care Provider: Leonie Robinson Other Clinician: Referring Provider: Treating Provider/Extender: Candace Robinson Weeks in Treatment: 11 Constitutional respirations regular, non-labored and within target range for patient.. Cardiovascular 2+ dorsalis pedis/posterior tibialis pulses. Psychiatric pleasant and cooperative. Notes Wounds throughout feet bilaterally with mostly nonviable tissue and scant granulation tissue present. No evidence of surrounding tissue infection. Electronic Signature(s) Signed: 05/11/2021 8:20:00 AM By: Candace Shan DO Entered By: Candace Robinson on 05/11/2021 08:13:37 -------------------------------------------------------------------------------- Physician Orders Details Patient Name: Date of Service: [name], [name] 05/10/2021 3:15 PM Medical Record Number: [id number] Patient Account Number: [id number] Date of Birth/Sex: Treating RN: Apr 17, 1966 (55 y.o. Candace Robinson Primary Care Provider: Leonie Robinson Other Clinician: Referring Provider: Treating Provider/Extender: Darcey Nora in Treatment: 11 Verbal /  Phone Orders: No Diagnosis Coding ICD-10 Coding Code Description [phone] Non-pressure chronic ulcer of other part of right foot with fat layer exposed L97.522 Non-pressure chronic ulcer of other part of left foot with fat layer exposed I73.9 Peripheral vascular disease, unspecified E11.9 Type 2 diabetes mellitus without complications C78.9 Other local lupus erythematosus F81.01 Chronic diastolic (congestive) heart failure I10 Essential (primary) hypertension E11.621 Type 2 diabetes mellitus with foot ulcer Follow-up Appointments ppointment in 1 week. - with Dr. [place] *****EXTRA TIME - 26 MINUTES***** Return A Other: - [facility]=Supplies Bathing/ Shower/ Hygiene May shower and wash wound with soap and water. - when dressings changed Edema Control - Lymphedema / SCD / Other Elevate legs to the level of the heart or above for 30 minutes daily and/or when sitting, a frequency of: - throughout the day Avoid standing for long periods of time. Additional Orders / Instructions Follow Nutritious Diet - 100-120g of protein daily Wound Treatment Wound #1 - Ankle Wound Laterality: Right, Medial Cleanser: Soap and Water 1 x Per Day/30 Days Discharge Instructions: May shower and wash wound with dial antibacterial soap and water prior to dressing change. Cleanser: Wound Cleanser 1 x Per Day/30 Days Discharge Instructions: Cleanse the wound with wound cleanser prior to applying a clean dressing using gauze sponges, not tissue or cotton balls. Prim Dressing: KerraCel Ag Gelling Fiber Dressing, 4x5 in (silver alginate) ary 1 x Per Day/30 Days Discharge Instructions: Apply silver alginate over Santyl Prim Dressing: Santyl Ointment 1 x Per Day/30 Days ary Discharge Instructions: Apply nickel thick amount to wound bed as instructed Secondary Dressing: ABD Pad, 8x10 (Generic) 1 x Per Day/30 Days Discharge Instructions: Apply over primary dressing as directed. Secondary Dressing: NonWoven Sponge,  4x4 (in/in) (Generic) 1 x Per Day/30 Days Discharge Instructions: Apply over primary dressing as directed Secured With: Elastic Bandage 4 inch (ACE bandage) 1 x Per Day/30 Days Discharge Instructions: Secure with ACE bandage as directed. Secured With: [employer], 4.5x3.1 (in/yd) 1 x Per Day/30 Days Discharge Instructions: Secure with Kerlix as directed. Secured With: Transpore Surgical Tape, 2x10 (in/yd) 1 x Per Day/30 Days Discharge Instructions: Secure dressing with tape as directed. Wound #2 - Foot Wound Laterality: Dorsal, Right Cleanser: Soap and Water 1 x Per Day/30 Days Discharge Instructions: May shower and wash wound with dial antibacterial soap and water prior to dressing change. Cleanser: Wound Cleanser 1 x Per Day/30 Days Discharge Instructions: Cleanse the wound  with wound cleanser prior to applying a clean dressing using gauze sponges, not tissue or cotton balls. Prim Dressing: KerraCel Ag Gelling Fiber Dressing, 4x5 in (silver alginate) ary 1 x Per Day/30 Days Discharge Instructions: Apply silver alginate over Santyl Prim Dressing: Santyl Ointment 1 x Per Day/30 Days ary Discharge Instructions: Apply nickel thick amount to wound bed as instructed Secondary Dressing: ABD Pad, 8x10 (Generic) 1 x Per Day/30 Days Discharge Instructions: Apply over primary dressing as directed. Secondary Dressing: NonWoven Sponge, 4x4 (in/in) (Generic) 1 x Per Day/30 Days Discharge Instructions: Apply over primary dressing as directed Secured With: Elastic Bandage 4 inch (ACE bandage) 1 x Per Day/30 Days Discharge Instructions: Secure with ACE bandage as directed. Secured With: [employer], 4.5x3.1 (in/yd) 1 x Per Day/30 Days Discharge Instructions: Secure with Kerlix as directed. Secured With: Transpore Surgical Tape, 2x10 (in/yd) 1 x Per Day/30 Days Discharge Instructions: Secure dressing with tape as directed. Wound #3 - Ankle Wound Laterality: Left, Medial Cleanser: Soap  and Water 1 x Per Day/30 Days Discharge Instructions: May shower and wash wound with dial antibacterial soap and water prior to dressing change. Cleanser: Wound Cleanser 1 x Per Day/30 Days Discharge Instructions: Cleanse the wound with wound cleanser prior to applying a clean dressing using gauze sponges, not tissue or cotton balls. Prim Dressing: KerraCel Ag Gelling Fiber Dressing, 4x5 in (silver alginate) ary 1 x Per Day/30 Days Discharge Instructions: Apply silver alginate over Santyl Prim Dressing: Santyl Ointment 1 x Per Day/30 Days ary Discharge Instructions: Apply nickel thick amount to wound bed as instructed Secondary Dressing: ABD Pad, 8x10 (Generic) 1 x Per Day/30 Days Discharge Instructions: Apply over primary dressing as directed. Secondary Dressing: NonWoven Sponge, 4x4 (in/in) (Generic) 1 x Per Day/30 Days Discharge Instructions: Apply over primary dressing as directed Secured With: Elastic Bandage 4 inch (ACE bandage) 1 x Per Day/30 Days Discharge Instructions: Secure with ACE bandage as directed. Secured With: [employer], 4.5x3.1 (in/yd) 1 x Per Day/30 Days Discharge Instructions: Secure with Kerlix as directed. Secured With: Transpore Surgical Tape, 2x10 (in/yd) 1 x Per Day/30 Days Discharge Instructions: Secure dressing with tape as directed. Wound #4 - Ankle Wound Laterality: Left, Lateral Cleanser: Soap and Water 1 x Per Day/30 Days Discharge Instructions: May shower and wash wound with dial antibacterial soap and water prior to dressing change. Cleanser: Wound Cleanser 1 x Per Day/30 Days Discharge Instructions: Cleanse the wound with wound cleanser prior to applying a clean dressing using gauze sponges, not tissue or cotton balls. Prim Dressing: KerraCel Ag Gelling Fiber Dressing, 4x5 in (silver alginate) ary 1 x Per Day/30 Days Discharge Instructions: Apply silver alginate over Santyl Prim Dressing: Santyl Ointment 1 x Per Day/30 Days ary Discharge  Instructions: Apply nickel thick amount to wound bed as instructed Secondary Dressing: ABD Pad, 8x10 (Generic) 1 x Per Day/30 Days Discharge Instructions: Apply over primary dressing as directed. Secondary Dressing: NonWoven Sponge, 4x4 (in/in) (Generic) 1 x Per Day/30 Days Discharge Instructions: Apply over primary dressing as directed Secured With: Elastic Bandage 4 inch (ACE bandage) 1 x Per Day/30 Days Discharge Instructions: Secure with ACE bandage as directed. Secured With: [employer], 4.5x3.1 (in/yd) 1 x Per Day/30 Days Discharge Instructions: Secure with Kerlix as directed. Secured With: Transpore Surgical Tape, 2x10 (in/yd) 1 x Per Day/30 Days Discharge Instructions: Secure dressing with tape as directed. Wound #5 - Foot Wound Laterality: Dorsal, Left Cleanser: Soap and Water 1 x Per Day/30 Days Discharge Instructions: May shower  and wash wound with dial antibacterial soap and water prior to dressing change. Cleanser: Wound Cleanser 1 x Per Day/30 Days Discharge Instructions: Cleanse the wound with wound cleanser prior to applying a clean dressing using gauze sponges, not tissue or cotton balls. Prim Dressing: KerraCel Ag Gelling Fiber Dressing, 4x5 in (silver alginate) ary 1 x Per Day/30 Days Discharge Instructions: Apply silver alginate over Santyl Prim Dressing: Santyl Ointment 1 x Per Day/30 Days ary Discharge Instructions: Apply nickel thick amount to wound bed as instructed Secondary Dressing: ABD Pad, 8x10 (Generic) 1 x Per Day/30 Days Discharge Instructions: Apply over primary dressing as directed. Secondary Dressing: NonWoven Sponge, 4x4 (in/in) (Generic) 1 x Per Day/30 Days Discharge Instructions: Apply over primary dressing as directed Secured With: Elastic Bandage 4 inch (ACE bandage) 1 x Per Day/30 Days Discharge Instructions: Secure with ACE bandage as directed. Secured With: [employer], 4.5x3.1 (in/yd) 1 x Per Day/30 Days Discharge Instructions:  Secure with Kerlix as directed. Secured With: Transpore Surgical Tape, 2x10 (in/yd) 1 x Per Day/30 Days Discharge Instructions: Secure dressing with tape as directed. Wound #6 - Foot Wound Laterality: Left, Medial, Distal Cleanser: Soap and Water 1 x Per Day/30 Days Discharge Instructions: May shower and wash wound with dial antibacterial soap and water prior to dressing change. Cleanser: Wound Cleanser 1 x Per Day/30 Days Discharge Instructions: Cleanse the wound with wound cleanser prior to applying a clean dressing using gauze sponges, not tissue or cotton balls. Prim Dressing: KerraCel Ag Gelling Fiber Dressing, 4x5 in (silver alginate) ary 1 x Per Day/30 Days Discharge Instructions: Apply silver alginate over Santyl Prim Dressing: Santyl Ointment 1 x Per Day/30 Days ary Discharge Instructions: Apply nickel thick amount to wound bed as instructed Secondary Dressing: ABD Pad, 8x10 (Generic) 1 x Per Day/30 Days Discharge Instructions: Apply over primary dressing as directed. Secondary Dressing: NonWoven Sponge, 4x4 (in/in) (Generic) 1 x Per Day/30 Days Discharge Instructions: Apply over primary dressing as directed Secured With: Elastic Bandage 4 inch (ACE bandage) 1 x Per Day/30 Days Discharge Instructions: Secure with ACE bandage as directed. Secured With: [employer], 4.5x3.1 (in/yd) 1 x Per Day/30 Days Discharge Instructions: Secure with Kerlix as directed. Secured With: Transpore Surgical Tape, 2x10 (in/yd) 1 x Per Day/30 Days Discharge Instructions: Secure dressing with tape as directed. Wound #7 - Ankle Wound Laterality: Right, Lateral Cleanser: Soap and Water 1 x Per Day/30 Days Discharge Instructions: May shower and wash wound with dial antibacterial soap and water prior to dressing change. Cleanser: Wound Cleanser 1 x Per Day/30 Days Discharge Instructions: Cleanse the wound with wound cleanser prior to applying a clean dressing using gauze sponges, not tissue or  cotton balls. Prim Dressing: KerraCel Ag Gelling Fiber Dressing, 4x5 in (silver alginate) ary 1 x Per Day/30 Days Discharge Instructions: Apply silver alginate over Santyl Prim Dressing: Santyl Ointment 1 x Per Day/30 Days ary Discharge Instructions: Apply nickel thick amount to wound bed as instructed Secondary Dressing: ABD Pad, 8x10 (Generic) 1 x Per Day/30 Days Discharge Instructions: Apply over primary dressing as directed. Secondary Dressing: NonWoven Sponge, 4x4 (in/in) (Generic) 1 x Per Day/30 Days Discharge Instructions: Apply over primary dressing as directed Secured With: Elastic Bandage 4 inch (ACE bandage) 1 x Per Day/30 Days Discharge Instructions: Secure with ACE bandage as directed. Secured With: [employer], 4.5x3.1 (in/yd) 1 x Per Day/30 Days Discharge Instructions: Secure with Kerlix as directed. Secured With: Transpore Surgical Tape, 2x10 (in/yd) 1 x Per Day/30 Days Discharge Instructions:  Secure dressing with tape as directed. Laboratory Bacteria identified in Tissue by Biopsy culture (MICRO) - (ICD10 L97.522 - Non-pressure chronic ulcer of other part of left foot with fat layer exposed) LOINC Code: [phone] Convenience Name: Biopsy specimen culture erobe culture (MICRO) - (ICD10 Y10.175 - Non-pressure chronic ulcer of other part of left foot Bacteria identified in Unspecified specimen by A with fat layer exposed) LOINC Code: 102-5 Convenience Name: Areobic culture-specimen not specified Electronic Signature(s) Signed: 05/11/2021 8:20:00 AM By: Candace Shan DO Previous Signature: 05/10/2021 5:20:47 PM Version By: Lorrin Jackson Entered By: Candace Robinson on 05/11/2021 08:14:13 -------------------------------------------------------------------------------- Problem List Details Patient Name: Date of Service: [name], [name] 05/10/2021 3:15 PM Medical Record Number: [id number] Patient Account Number: [id number] Date of Birth/Sex: Treating  RN: [birth year] (55 y.o. Candace Robinson Primary Care Provider: Leonie Robinson Other Clinician: Referring Provider: Treating Provider/Extender: Darcey Nora in Treatment: 11 Active Problems ICD-10 Encounter Code Description Active Date MDM Diagnosis L97.512 Non-pressure chronic ulcer of other part of right foot with fat layer exposed 02/22/2021 No Yes L97.522 Non-pressure chronic ulcer of other part of left foot with fat layer exposed 02/22/2021 No Yes I73.9 Peripheral vascular disease, unspecified 02/22/2021 No Yes E11.9 Type 2 diabetes mellitus without complications 3/53/6144 No Yes L93.2 Other local lupus erythematosus 02/22/2021 No Yes R15.40 Chronic diastolic (congestive) heart failure 02/22/2021 No Yes I10 Essential (primary) hypertension 02/22/2021 No Yes E11.621 Type 2 diabetes mellitus with foot ulcer 02/22/2021 No Yes Inactive Problems Resolved Problems Electronic Signature(s) Signed: 05/11/2021 8:20:00 AM By: Candace Shan DO Previous Signature: 05/10/2021 3:27:22 PM Version By: Lorrin Jackson Entered By: Candace Robinson on 05/11/2021 08:06:19 -------------------------------------------------------------------------------- Progress Note Details Patient Name: Date of Service: [name], [name] 05/10/2021 3:15 PM Medical Record Number: [id number] Patient Account Number: [id number] Date of Birth/Sex: Treating RN: 1965-07-11 (55 y.o. Candace Robinson Primary Care Provider: Leonie Robinson Other Clinician: Referring Provider: Treating Provider/Extender: Darcey Nora in Treatment: 11 Subjective Chief Complaint Information obtained from Patient Bilateral feet wounds History of Present Illness (HPI) Admission 8/22 Ms. Graciela Plato is a 55 year old female with a past medical history of diet-controlled type 2 diabetes, hypothyroidism, chronic diastolic heart failure, cutaneous lupus erythematosus and  essential hypertension that presents to the clinic for a 73-month history of worsening bilateral feet ulcers. She states that wounds to her feet have been an ongoing issue for several years. She states that with wound care they improve however they tend to wax and wane. 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 She was discharged to a nursing facility and she was provided wound care for her bilateral feet ulcers at that time. She states they used Silvadene and silver alginate. She states that when she was discharged in April from the nursing facility her wounds had improved but were not fully closed. She currently uses Silvadene but has not been using silver alginate for several months due to running out. She reports significant decline in her wound healing over the last 4 months. She reports pain to the wound sites. She currently denies increased warmth erythema or purulent drainage. 8/29; patient presents for 1 week follow-up. She states she had ABIs completed without TBI's. She continues to use silver alginate with dressing changes. She reports continued pain. She denies signs of infection. 9/8; patient presents for 1 week follow-up. She was evaluated by Dr. Donzetta Matters and she reports follow-up with him in 1 month.  She has been using Santyl to the wound beds. She reports some improvement in wound healing. She denies signs of infection. 9/15; patient presents for follow-up. She continues to use Santyl and silver alginate to the wound beds. She states the alginate is sticking and hard to remove with dressing changes. She currently denies signs of infection. 9/22; patient presents for 1 week follow-up. She is using Santyl and silver alginate to the wound beds. She ran out of [place] and has been using Silvadene. She states that gentamicin ointment is ready at her pharmacy and will be picking this up today. She has no issues or complaints today. She denies signs  of infection. 9/30; she is using Santyl and silver alginate. She has substantial wounds on her bilateral feet and bilateral ankles that have been there for a year. She is in a lot of pain. She cannot bear to have these wounds touched let alone a mechanical debridement here. Furthermore the etiology of this is really not totally clear. She had reasonably normal ABIs with triphasic waveforms in August [I did not review this however]. She is completing a course of ciprofloxacin for an area on the left lateral ankle. She has had prior ablations and follows with vascular surgery. Finally she apparently has lupus 10/6; patient has been using Silvadene to the wound beds with dressing changes. She states she is picking up Santyl today. She has no issues or complaints today. She denies signs of infection. 10/17 the patient managed to get Santyl which the she is using to all wound beds. There is a large areas of breakdown here including substantially on the left distal foot, right foot involving the dorsal toes both medial ankles. All of these wounds are covered with some degree of very adherent fibrinous material. The patient did has an appointment with vascular surgery Dr. Donzetta Matters in 2 days time 10/24; patient presents for follow-up. She has been using Santyl and silver alginate to the wound beds. She states that she is going to be set up with rheumatology soon. She denies signs of infection. She overall reports stability in her wound healing. 11/7; patient presents for follow-up. She continues to use Santyl and silver alginate to the wound beds. She has an appointment set to see rheumatology on 07/06/2021. She reports some improvement in her wound healing. She currently denies signs of infection. Patient History Information obtained from Patient. Family History Unknown History. Social History Never smoker, Marital Status - Single, Alcohol Use - Never, Drug Use - No History, Caffeine Use - Rarely. Medical  History Hematologic/Lymphatic Patient has history of Anemia Cardiovascular Patient has history of Arrhythmia - A-Fib, Congestive Heart Failure, Hypertension Denies history of Angina Endocrine Patient has history of Type II Diabetes Immunological Patient has history of Lupus Erythematosus Medical A Surgical History Notes nd Endocrine Grave's disease Objective Constitutional respirations regular, non-labored and within target range for patient.. Vitals Time Taken: 3:24 PM, Height: 63 in, Weight: 157 lbs, BMI: 27.8, Temperature: 99.2 F, Pulse: 93 bpm, Respiratory Rate: 18 breaths/min, Blood Pressure: 118/80 mmHg. Cardiovascular 2+ dorsalis pedis/posterior tibialis pulses. Psychiatric pleasant and cooperative. General Notes: Wounds throughout feet bilaterally with mostly nonviable tissue and scant granulation tissue present. No evidence of surrounding tissue infection. Integumentary (Hair, Skin) Wound #1 status is Open. Original cause of wound was Gradually Appeared. The date acquired was: 04/03/2020. The wound has been in treatment 11 weeks. The wound is located on the Right,Medial Ankle. The wound measures 6cm length x 5.2cm width x 0.2cm depth;  24.504cm^2 area and 4.901cm^3 volume. There is Fat Layer (Subcutaneous Tissue) exposed. There is no tunneling or undermining noted. There is a medium amount of serosanguineous drainage noted. The wound margin is distinct with the outline attached to the wound base. There is small (1-33%) red granulation within the wound bed. There is a large (67-100%) amount of necrotic tissue within the wound bed including Adherent Slough. Wound #2 status is Open. Original cause of wound was Gradually Appeared. The date acquired was: 04/03/2020. The wound has been in treatment 11 weeks. The wound is located on the Right,Dorsal Foot. The wound measures 5.5cm length x 5.5cm width x 0.1cm depth; 23.758cm^2 area and 2.376cm^3 volume. There is Fat Layer  (Subcutaneous Tissue) exposed. There is no tunneling or undermining noted. There is a medium amount of serosanguineous drainage noted. The wound margin is distinct with the outline attached to the wound base. There is small (1-33%) red granulation within the wound bed. There is a large (67-100%) amount of necrotic tissue within the wound bed including Adherent Slough. Wound #3 status is Open. Original cause of wound was Gradually Appeared. The date acquired was: 02/22/2021. The wound has been in treatment 11 weeks. The wound is located on the Left,Medial Ankle. The wound measures 0.8cm length x 2.1cm width x 0.1cm depth; 1.319cm^2 area and 0.132cm^3 volume. There is Fat Layer (Subcutaneous Tissue) exposed. There is no tunneling or undermining noted. There is a medium amount of serosanguineous drainage noted. The wound margin is distinct with the outline attached to the wound base. There is large (67-100%) red granulation within the wound bed. There is a small (1-33%) amount of necrotic tissue within the wound bed including Adherent Slough. Wound #4 status is Open. Original cause of wound was Gradually Appeared. The date acquired was: 04/03/2020. The wound has been in treatment 11 weeks. The wound is located on the Left,Lateral Ankle. The wound measures 7.5cm length x 5.5cm width x 0.2cm depth; 32.398cm^2 area and 6.48cm^3 volume. There is Fat Layer (Subcutaneous Tissue) exposed. There is no tunneling or undermining noted. There is a medium amount of serosanguineous drainage noted. The wound margin is distinct with the outline attached to the wound base. There is medium (34-66%) red granulation within the wound bed. There is a medium (34- 66%) amount of necrotic tissue within the wound bed including Adherent Slough. Wound #5 status is Open. Original cause of wound was Gradually Appeared. The date acquired was: 04/03/2020. The wound has been in treatment 11 weeks. The wound is located on the Left,Dorsal  Foot. The wound measures 9.5cm length x 7.5cm width x 0.2cm depth; 55.96cm^2 area and 11.192cm^3 volume. There is Fat Layer (Subcutaneous Tissue) exposed. There is no tunneling or undermining noted. There is a medium amount of serosanguineous drainage noted. The wound margin is distinct with the outline attached to the wound base. There is small (1-33%) red granulation within the wound bed. There is a large (67-100%) amount of necrotic tissue within the wound bed. Wound #6 status is Open. Original cause of wound was Gradually Appeared. The date acquired was: 04/03/2020. The wound has been in treatment 11 weeks. The wound is located on the Left,Distal,Medial Foot. The wound measures 0.5cm length x 0.5cm width x 1cm depth; 0.196cm^2 area and 0.196cm^3 volume. There is Fat Layer (Subcutaneous Tissue) exposed. There is no tunneling or undermining noted. There is a medium amount of serosanguineous drainage noted. The wound margin is distinct with the outline attached to the wound base. There is large (67-100%)  red, pink granulation within the wound bed. There is a small (1- 33%) amount of necrotic tissue within the wound bed including Adherent Slough. Wound #7 status is Open. Original cause of wound was Gradually Appeared. The date acquired was: 04/08/2021. The wound has been in treatment 4 weeks. The wound is located on the Right,Lateral Ankle. The wound measures 1cm length x 1.1cm width x 0.1cm depth; 0.864cm^2 area and 0.086cm^3 volume. There is Fat Layer (Subcutaneous Tissue) exposed. There is no tunneling or undermining noted. There is a medium amount of serosanguineous drainage noted. The wound margin is distinct with the outline attached to the wound base. There is small (1-33%) pink granulation within the wound bed. There is a large (67-100%) amount of necrotic tissue within the wound bed including Adherent Slough. Assessment Active Problems ICD-10 Non-pressure chronic ulcer of other part of right  foot with fat layer exposed Non-pressure chronic ulcer of other part of left foot with fat layer exposed Peripheral vascular disease, unspecified Type 2 diabetes mellitus without complications Other local lupus erythematosus Chronic diastolic (congestive) heart failure Essential (primary) hypertension Type 2 diabetes mellitus with foot ulcer Patient's wounds are stable. There is extensive necrotic debris throughout the wound beds. At this time I obtained a biopsy and tissue culture due to the fact that we are not making much progress with conservative wound care. I am concerned that this is related to her lupus. She has a rheumatology appointment in 2 months. She states the last time she was seen by rheumatology was in 2006. I do not see any lupus related lab work in the chart. Currently there are no signs of infection. I attempted to debride nonviable tissue and I recommended continuing with Santyl and silver alginate. Procedures Wound #5 Pre-procedure diagnosis of Wound #5 is a Diabetic Wound/Ulcer of the Lower Extremity located on the Left,Dorsal Foot .Severity of Tissue Pre Debridement is: Fat layer exposed. There was a Excisional Skin/Subcutaneous Tissue Debridement with a total area of 15 sq cm performed by Candace Shan, DO. With the following instrument(s): Curette to remove Non-Viable tissue/material. Material removed includes Subcutaneous Tissue and Slough and after achieving pain control using Lidocaine 5% topical ointment. 1 specimen was taken by a Tissue Culture and sent to the lab per facility protocol. A time out was conducted at 15:59, prior to the start of the procedure. A Minimum amount of bleeding was controlled with Pressure. The procedure was tolerated well. Post Debridement Measurements: 9.5cm length x 7.5cm width x 0.2cm depth; 11.192cm^3 volume. Character of Wound/Ulcer Post Debridement is stable. Severity of Tissue Post Debridement is: Fat layer exposed. Post  procedure Diagnosis Wound #5: Same as Pre-Procedure Pre-procedure diagnosis of Wound #5 is a Diabetic Wound/Ulcer of the Lower Extremity located on the Left, Dorsal Foot . There was a biopsy performed by Candace Shan, DO. The skin was cleansed and prepped with anti-septic followed by pain control using Lidocaine Injectable: 1%. Utilizing a 4 mm tissue punch, tissue was removed at its base and sent to pathology. A Minimum amount of bleeding was controlled with Pressure. A time out was conducted at 16:00, prior to the start of the procedure. The procedure was tolerated well. Post procedure Diagnosis Wound #5: Same as Pre-Procedure Plan Follow-up Appointments: Return Appointment in 1 week. - with Dr. Heber Otse[place] *****EXTRA TIME - 12 MINUTES***** Other: - [facility]=Supplies Bathing/ Shower/ Hygiene: May shower and wash wound with soap and water. - when dressings changed Edema Control - Lymphedema / SCD / Other: Elevate legs  to the level of the heart or above for 30 minutes daily and/or when sitting, a frequency of: - throughout the day Avoid standing for long periods of time. Additional Orders / Instructions: Follow Nutritious Diet - 100-120g of protein daily Laboratory ordered were: Biopsy specimen culture, Areobic culture-specimen not specified WOUND #1: - Ankle Wound Laterality: Right, Medial Cleanser: Soap and Water 1 x Per Day/30 Days Discharge Instructions: May shower and wash wound with dial antibacterial soap and water prior to dressing change. Cleanser: Wound Cleanser 1 x Per Day/30 Days Discharge Instructions: Cleanse the wound with wound cleanser prior to applying a clean dressing using gauze sponges, not tissue or cotton balls. Prim Dressing: KerraCel Ag Gelling Fiber Dressing, 4x5 in (silver alginate) 1 x Per Day/30 Days ary Discharge Instructions: Apply silver alginate over Santyl Prim Dressing: Santyl Ointment 1 x Per Day/30 Days ary Discharge Instructions: Apply nickel  thick amount to wound bed as instructed Secondary Dressing: ABD Pad, 8x10 (Generic) 1 x Per Day/30 Days Discharge Instructions: Apply over primary dressing as directed. Secondary Dressing: NonWoven Sponge, 4x4 (in/in) (Generic) 1 x Per Day/30 Days Discharge Instructions: Apply over primary dressing as directed Secured With: Elastic Bandage 4 inch (ACE bandage) 1 x Per Day/30 Days Discharge Instructions: Secure with ACE bandage as directed. Secured With: [employer], 4.5x3.1 (in/yd) 1 x Per Day/30 Days Discharge Instructions: Secure with Kerlix as directed. Secured With: Transpore Surgical T ape, 2x10 (in/yd) 1 x Per Day/30 Days Discharge Instructions: Secure dressing with tape as directed. WOUND #2: - Foot Wound Laterality: Dorsal, Right Cleanser: Soap and Water 1 x Per Day/30 Days Discharge Instructions: May shower and wash wound with dial antibacterial soap and water prior to dressing change. Cleanser: Wound Cleanser 1 x Per Day/30 Days Discharge Instructions: Cleanse the wound with wound cleanser prior to applying a clean dressing using gauze sponges, not tissue or cotton balls. Prim Dressing: KerraCel Ag Gelling Fiber Dressing, 4x5 in (silver alginate) 1 x Per Day/30 Days ary Discharge Instructions: Apply silver alginate over Santyl Prim Dressing: Santyl Ointment 1 x Per Day/30 Days ary Discharge Instructions: Apply nickel thick amount to wound bed as instructed Secondary Dressing: ABD Pad, 8x10 (Generic) 1 x Per Day/30 Days Discharge Instructions: Apply over primary dressing as directed. Secondary Dressing: NonWoven Sponge, 4x4 (in/in) (Generic) 1 x Per Day/30 Days Discharge Instructions: Apply over primary dressing as directed Secured With: Elastic Bandage 4 inch (ACE bandage) 1 x Per Day/30 Days Discharge Instructions: Secure with ACE bandage as directed. Secured With: [employer], 4.5x3.1 (in/yd) 1 x Per Day/30 Days Discharge Instructions: Secure with Kerlix as  directed. Secured With: Transpore Surgical T ape, 2x10 (in/yd) 1 x Per Day/30 Days Discharge Instructions: Secure dressing with tape as directed. WOUND #3: - Ankle Wound Laterality: Left, Medial Cleanser: Soap and Water 1 x Per Day/30 Days Discharge Instructions: May shower and wash wound with dial antibacterial soap and water prior to dressing change. Cleanser: Wound Cleanser 1 x Per Day/30 Days Discharge Instructions: Cleanse the wound with wound cleanser prior to applying a clean dressing using gauze sponges, not tissue or cotton balls. Prim Dressing: KerraCel Ag Gelling Fiber Dressing, 4x5 in (silver alginate) 1 x Per Day/30 Days ary Discharge Instructions: Apply silver alginate over Santyl Prim Dressing: Santyl Ointment 1 x Per Day/30 Days ary Discharge Instructions: Apply nickel thick amount to wound bed as instructed Secondary Dressing: ABD Pad, 8x10 (Generic) 1 x Per Day/30 Days Discharge Instructions: Apply over primary dressing as directed. Secondary  Dressing: NonWoven Sponge, 4x4 (in/in) (Generic) 1 x Per Day/30 Days Discharge Instructions: Apply over primary dressing as directed Secured With: Elastic Bandage 4 inch (ACE bandage) 1 x Per Day/30 Days Discharge Instructions: Secure with ACE bandage as directed. Secured With: [employer], 4.5x3.1 (in/yd) 1 x Per Day/30 Days Discharge Instructions: Secure with Kerlix as directed. Secured With: Trans[employer] Surgical T ape, 2x10 (in/yd) 1 x Per Day/30 Days Discharge Instructions: Secure dressing with tape as directed. WOUND #4: - Ankle Wound Laterality: Left, Lateral Cleanser: Soap and Water 1 x Per Day/30 Days Discharge Instructions: May shower and wash wound with dial antibacterial soap and water prior to dressing change. Cleanser: Wound Cleanser 1 x Per Day/30 Days Discharge Instructions: Cleanse the wound with wound cleanser prior to applying a clean dressing using gauze sponges, not tissue or cotton balls. Prim Dressing:  KerraCel Ag Gelling Fiber Dressing, 4x5 in (silver alginate) 1 x Per Day/30 Days ary Discharge Instructions: Apply silver alginate over Santyl Prim Dressing: Santyl Ointment 1 x Per Day/30 Days ary Discharge Instructions: Apply nickel thick amount to wound bed as instructed Secondary Dressing: ABD Pad, 8x10 (Generic) 1 x Per Day/30 Days Discharge Instructions: Apply over primary dressing as directed. Secondary Dressing: NonWoven Sponge, 4x4 (in/in) (Generic) 1 x Per Day/30 Days Discharge Instructions: Apply over primary dressing as directed Secured With: Elastic Bandage 4 inch (ACE bandage) 1 x Per Day/30 Days Discharge Instructions: Secure with ACE bandage as directed. Secured With: [employer], 4.5x3.1 (in/yd) 1 x Per Day/30 Days Discharge Instructions: Secure with Kerlix as directed. Secured With: Trans[employer] Surgical T ape, 2x10 (in/yd) 1 x Per Day/30 Days Discharge Instructions: Secure dressing with tape as directed. WOUND #5: - Foot Wound Laterality: Dorsal, Left Cleanser: Soap and Water 1 x Per Day/30 Days Discharge Instructions: May shower and wash wound with dial antibacterial soap and water prior to dressing change. Cleanser: Wound Cleanser 1 x Per Day/30 Days Discharge Instructions: Cleanse the wound with wound cleanser prior to applying a clean dressing using gauze sponges, not tissue or cotton balls. Prim Dressing: KerraCel Ag Gelling Fiber Dressing, 4x5 in (silver alginate) 1 x Per Day/30 Days ary Discharge Instructions: Apply silver alginate over Santyl Prim Dressing: Santyl Ointment 1 x Per Day/30 Days ary Discharge Instructions: Apply nickel thick amount to wound bed as instructed Secondary Dressing: ABD Pad, 8x10 (Generic) 1 x Per Day/30 Days Discharge Instructions: Apply over primary dressing as directed. Secondary Dressing: NonWoven Sponge, 4x4 (in/in) (Generic) 1 x Per Day/30 Days Discharge Instructions: Apply over primary dressing as directed Secured With:  Elastic Bandage 4 inch (ACE bandage) 1 x Per Day/30 Days Discharge Instructions: Secure with ACE bandage as directed. Secured With: [employer], 4.5x3.1 (in/yd) 1 x Per Day/30 Days Discharge Instructions: Secure with Kerlix as directed. Secured With: Trans[employer] Surgical T ape, 2x10 (in/yd) 1 x Per Day/30 Days Discharge Instructions: Secure dressing with tape as directed. WOUND #6: - Foot Wound Laterality: Left, Medial, Distal Cleanser: Soap and Water 1 x Per Day/30 Days Discharge Instructions: May shower and wash wound with dial antibacterial soap and water prior to dressing change. Cleanser: Wound Cleanser 1 x Per Day/30 Days Discharge Instructions: Cleanse the wound with wound cleanser prior to applying a clean dressing using gauze sponges, not tissue or cotton balls. Prim Dressing: KerraCel Ag Gelling Fiber Dressing, 4x5 in (silver alginate) 1 x Per Day/30 Days ary Discharge Instructions: Apply silver alginate over Santyl Prim Dressing: Santyl Ointment 1 x Per Day/30 Days ary Discharge  Instructions: Apply nickel thick amount to wound bed as instructed Secondary Dressing: ABD Pad, 8x10 (Generic) 1 x Per Day/30 Days Discharge Instructions: Apply over primary dressing as directed. Secondary Dressing: NonWoven Sponge, 4x4 (in/in) (Generic) 1 x Per Day/30 Days Discharge Instructions: Apply over primary dressing as directed Secured With: Elastic Bandage 4 inch (ACE bandage) 1 x Per Day/30 Days Discharge Instructions: Secure with ACE bandage as directed. Secured With: [employer], 4.5x3.1 (in/yd) 1 x Per Day/30 Days Discharge Instructions: Secure with Kerlix as directed. Secured With: [employer] T ape, 2x10 (in/yd) 1 x Per Day/30 Days Discharge Instructions: Secure dressing with tape as directed. WOUND #7: - Ankle Wound Laterality: Right, Lateral Cleanser: Soap and Water 1 x Per Day/30 Days Discharge Instructions: May shower and wash wound with dial antibacterial soap  and water prior to dressing change. Cleanser: Wound Cleanser 1 x Per Day/30 Days Discharge Instructions: Cleanse the wound with wound cleanser prior to applying a clean dressing using gauze sponges, not tissue or cotton balls. Prim Dressing: KerraCel Ag Gelling Fiber Dressing, 4x5 in (silver alginate) 1 x Per Day/30 Days ary Discharge Instructions: Apply silver alginate over Santyl Prim Dressing: Santyl Ointment 1 x Per Day/30 Days ary Discharge Instructions: Apply nickel thick amount to wound bed as instructed Secondary Dressing: ABD Pad, 8x10 (Generic) 1 x Per Day/30 Days Discharge Instructions: Apply over primary dressing as directed. Secondary Dressing: NonWoven Sponge, 4x4 (in/in) (Generic) 1 x Per Day/30 Days Discharge Instructions: Apply over primary dressing as directed Secured With: Elastic Bandage 4 inch (ACE bandage) 1 x Per Day/30 Days Discharge Instructions: Secure with ACE bandage as directed. Secured With: [employer], 4.5x3.1 (in/yd) 1 x Per Day/30 Days Discharge Instructions: Secure with Kerlix as directed. Secured With: Transpore Surgical T ape, 2x10 (in/yd) 1 x Per Day/30 Days Discharge Instructions: Secure dressing with tape as directed. 1. Santyl and silver alginate 2. Punch biopsy for Derm path and tissue culture 3. Follow-up in 1 week Electronic Signature(s) Signed: 05/11/2021 8:20:00 AM By: Candace Shan DO Entered By: Candace Robinson on 05/11/2021 08:17:29 -------------------------------------------------------------------------------- HxROS Details Patient Name: Date of Service: [name], [name] 05/10/2021 3:15 PM Medical Record Number: [id number] Patient Account Number: [id number] Date of Birth/Sex: Treating RN: [birth year] (55 y.o. Candace Robinson Primary Care Provider: Leonie Robinson Other Clinician: Referring Provider: Treating Provider/Extender: Darcey Nora in Treatment: 11 Information Obtained  From Patient Hematologic/Lymphatic Medical History: Positive for: Anemia Cardiovascular Medical History: Positive for: Arrhythmia - A-Fib; Congestive Heart Failure; Hypertension Negative for: Angina Endocrine Medical History: Positive for: Type II Diabetes Past Medical History Notes: Grave's disease Time with diabetes: over 10 years Treated with: Diet Blood sugar tested every day: No Immunological Medical History: Positive for: Lupus Erythematosus Immunizations Pneumococcal Vaccine: Received Pneumococcal Vaccination: No Implantable Devices None Family and Social History Unknown History: Yes; Never smoker; Marital Status - Single; Alcohol Use: Never; Drug Use: No History; Caffeine Use: Rarely; Financial Concerns: No; Food, Clothing or Shelter Needs: No; Support System Lacking: No; Transportation Concerns: No Electronic Signature(s) Signed: 05/11/2021 8:20:00 AM By: Candace Shan DO Signed: 05/11/2021 4:51:17 PM By: Lorrin Jackson Entered By: Candace Robinson on 05/11/2021 08:13:16 -------------------------------------------------------------------------------- SuperBill Details Patient Name: Date of Service: [name], [name] 05/10/2021 Medical Record Number: [id number] Patient Account Number: [id number] Date of Birth/Sex: Treating RN: 10-28-1965 (55 y.o. Candace Robinson Primary Care Provider: Leonie Robinson Other Clinician: Referring Provider: Treating Provider/Extender: Darcey Nora  in Treatment: 11 Diagnosis Coding ICD-10 Codes Code Description L97.512 Non-pressure chronic ulcer of other part of right foot with fat layer exposed L97.522 Non-pressure chronic ulcer of other part of left foot with fat layer exposed I73.9 Peripheral vascular disease, unspecified E11.9 Type 2 diabetes mellitus without complications O45.9 Other local lupus erythematosus X77.41 Chronic diastolic (congestive) heart failure I10 Essential (primary)  hypertension E11.621 Type 2 diabetes mellitus with foot ulcer Facility Procedures CPT4 Code: [id number] Description: 23343 - DEB SUBQ TISSUE 20 SQ CM/< ICD-10 Diagnosis Description L97.522 Non-pressure chronic ulcer of other part of left foot with fat layer exposed Modifier: Quantity: 1 Physician Procedures : CPT4 Code Description Modifier [id number] - WC PHYS SUBQ TISS 20 SQ CM ICD-10 Diagnosis Description L97.522 Non-pressure chronic ulcer of other part of left foot with fat layer exposed Quantity: 1 Electronic Signature(s) Signed: 05/11/2021 8:20:00 AM By: Candace Shan DO Signed: 05/11/2021 8:20:00 AM By: Candace Shan DO Previous Signature: 05/10/2021 5:20:47 PM Version By: Lorrin Jackson Entered By: Candace Robinson on 05/11/2021 08:19:42

## 2021-05-15 LAB — AEROBIC/ANAEROBIC CULTURE W GRAM STAIN (SURGICAL/DEEP WOUND): Gram Stain: NONE SEEN

## 2021-05-17 ENCOUNTER — Other Ambulatory Visit (HOSPITAL_BASED_OUTPATIENT_CLINIC_OR_DEPARTMENT_OTHER): Payer: Self-pay | Admitting: Internal Medicine

## 2021-05-17 ENCOUNTER — Other Ambulatory Visit: Payer: Self-pay

## 2021-05-17 ENCOUNTER — Encounter (HOSPITAL_BASED_OUTPATIENT_CLINIC_OR_DEPARTMENT_OTHER): Payer: Medicaid Other | Attending: Internal Medicine | Admitting: Internal Medicine

## 2021-05-17 DIAGNOSIS — E11621 Type 2 diabetes mellitus with foot ulcer: Secondary | ICD-10-CM

## 2021-05-17 DIAGNOSIS — L932 Other local lupus erythematosus: Secondary | ICD-10-CM

## 2021-05-17 DIAGNOSIS — L97512 Non-pressure chronic ulcer of other part of right foot with fat layer exposed: Secondary | ICD-10-CM | POA: Diagnosis not present

## 2021-05-17 DIAGNOSIS — L97522 Non-pressure chronic ulcer of other part of left foot with fat layer exposed: Secondary | ICD-10-CM | POA: Diagnosis not present

## 2021-05-17 NOTE — Progress Notes (Signed)
LOLAMAE, VOISIN ([id number]) Visit Report for 05/17/2021 Chief Complaint Document Details Patient Name: Date of Service: [name], [name] 05/17/2021 2:00 PM Medical Record Number: [id number] Patient Account Number: [id number] Date of Birth/Sex: Treating RN: [birth year] (55 y.o. Candace Robinson Primary Care Provider: [name] Other Clinician: Referring Provider: Treating Provider/Extender: Darcey Nora in Treatment: 12 Information Obtained from: Patient Chief Complaint Bilateral feet wounds Electronic Signature(s) Signed: 05/17/2021 4:36:42 PM By: Kalman Shan DO Entered By: Kalman Shan on 05/17/2021 16:23:17 -------------------------------------------------------------------------------- HPI Details Patient Name: Date of Service: [name], [name] 05/17/2021 2:00 PM Medical Record Number: [id number] Patient Account Number: [id number] Date of Birth/Sex: Treating RN: [birth year] (55 y.o. Candace Robinson Primary Care Provider: [name] Other Clinician: Referring Provider: Treating Provider/Extender: Darcey Nora in Treatment: 12 History of Present Illness HPI Description: Admission 8/22 Candace Robinson is a 55 year old female with a past medical history of diet-controlled type 2 diabetes, hypothyroidism, chronic diastolic heart failure, cutaneous lupus erythematosus and essential hypertension that presents to the clinic for a 60-month history of worsening bilateral feet ulcers. She states that wounds to her feet have been an ongoing issue for several years. She states that with wound care they improve however they tend to wax and wane. 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 She was discharged to a nursing facility and she was provided wound care for her bilateral feet ulcers at that time. She states they used Silvadene and silver alginate. She states  that when she was discharged in April from the nursing facility her wounds had improved but were not fully closed. She currently uses Silvadene but has not been using silver alginate for several months due to running out. She reports significant decline in her wound healing over the last 4 months. She reports pain to the wound sites. She currently denies increased warmth erythema or purulent drainage. 8/29; patient presents for 1 week follow-up. She states she had ABIs completed without TBI's. She continues to use silver alginate with dressing changes. She reports continued pain. She denies signs of infection. 9/8; patient presents for 1 week follow-up. She was evaluated by Dr. Donzetta Matters and she reports follow-up with him in 1 month. She has been using Santyl to the wound beds. She reports some improvement in wound healing. She denies signs of infection. 9/15; patient presents for follow-up. She continues to use Santyl and silver alginate to the wound beds. She states the alginate is sticking and hard to remove with dressing changes. She currently denies signs of infection. 9/22; patient presents for 1 week follow-up. She is using Santyl and silver alginate to the wound beds. She ran out of [place] and has been using Silvadene. She states that gentamicin ointment is ready at her pharmacy and will be picking this up today. She has no issues or complaints today. She denies signs of infection. 9/30; she is using Santyl and silver alginate. She has substantial wounds on her bilateral feet and bilateral ankles that have been there for a year. She is in a lot of pain. She cannot bear to have these wounds touched let alone a mechanical debridement here. Furthermore the etiology of this is really not totally clear. She had reasonably normal ABIs with triphasic waveforms in August [I did not review this however]. She is completing a course of ciprofloxacin for an area on the left lateral ankle. She has had prior  ablations and follows with vascular surgery. Finally she apparently has lupus 10/6; patient has been using Silvadene to the wound beds with dressing changes. She states she is picking up Santyl today. She has no issues or complaints today. She denies signs of infection. 10/17 the patient managed to get Santyl which the she is using to all wound beds. There is a large areas of breakdown here including substantially on the left distal foot, right foot involving the dorsal toes both medial ankles. All of these wounds are covered with some degree of very adherent fibrinous material. The patient did has an appointment with vascular surgery Dr. Donzetta Matters in 2 days time 10/24; patient presents for follow-up. She has been using Santyl and silver alginate to the wound beds. She states that she is going to be set up with rheumatology soon. She denies signs of infection. She overall reports stability in her wound healing. 11/7; patient presents for follow-up. She continues to use Santyl and silver alginate to the wound beds. She has an appointment set to see rheumatology on 07/06/2021. She reports some improvement in her wound healing. She currently denies signs of infection. 11/14; patient presents for follow-up. She has been using Santyl and silver alginate to the wound beds. She has no issues or complaints today. She denies signs of infection. Electronic Signature(s) Signed: 05/17/2021 4:36:42 PM By: Kalman Shan DO Entered By: Kalman Shan on 05/17/2021 16:23:43 -------------------------------------------------------------------------------- Physical Exam Details Patient Name: Date of Service: [name], [name] 05/17/2021 2:00 PM Medical Record Number: [id number] Patient Account Number: [id number] Date of Birth/Sex: Treating RN: Dec 02, 1965 (55 y.o. Candace Robinson Primary Care Provider: [name] Other Clinician: Referring Provider: Treating Provider/Extender: [name] Weeks in Treatment: 12 Constitutional respirations regular, non-labored and within target range for patient.. Cardiovascular 2+ dorsalis pedis/posterior tibialis pulses. Psychiatric pleasant and cooperative. Notes Wounds throughout the feet bilaterally with mostly nonviable tissue and scant granulation tissue present. No obvious signs of surrounding soft tissue infection. Electronic Signature(s) Signed: 05/17/2021 4:36:42 PM By: Kalman Shan DO Entered By: Kalman Shan on 05/17/2021 16:24:22 -------------------------------------------------------------------------------- Physician Orders Details Patient Name: Date of Service: [name], [name] 05/17/2021 2:00 PM Medical Record Number: [id number] Patient Account Number: [id number] Date of Birth/Sex: Treating RN: Jun 11, 1966 (55 y.o. Candace Robinson Primary Care Provider: [name] Other Clinician: Referring Provider: Treating Provider/Extender: Darcey Nora in Treatment: 12 Verbal / Phone Orders: No Diagnosis Coding Follow-up Appointments ppointment in 1 week. - with Dr. Heber [facility] *****EXTRA TIME - 8 MINUTES***** Return A Other: - [facility]=Supplies Bathing/ Shower/ Hygiene May shower and wash wound with soap and water. - when dressings changed Edema Control - Lymphedema / SCD / Other Elevate legs to the level of the heart or above for 30 minutes daily and/or when sitting, a frequency of: - throughout the day Avoid standing for long periods of time. Additional Orders / Instructions Follow Nutritious Diet - 100-120g of protein daily Wound Treatment Wound #1 - Ankle Wound Laterality: Right, Medial Cleanser: Soap and Water Other:2 times per day/30 Days Discharge Instructions: May shower and wash wound with dial antibacterial soap and water prior to dressing change. Cleanser: Wound Cleanser Other:2 times per day/30 Days Discharge Instructions: Cleanse  the wound with wound cleanser prior to applying a clean dressing using gauze sponges, not tissue or cotton balls. Prim Dressing: Dakin's Solution 0.125%, 16 (oz) Other:2 times per day/30 Days ary Discharge Instructions: Moisten gauze with  Dakin's solution and lay on top of wound bed Secondary Dressing: ABD Pad, 8x10 (Generic) Other:2 times per day/30 Days Discharge Instructions: Apply over primary dressing as directed. Secondary Dressing: NonWoven Sponge, 4x4 (in/in) (Generic) Other:2 times per day/30 Days Discharge Instructions: Apply over primary dressing as directed Secured With: Elastic Bandage 4 inch (ACE bandage) Other:2 times per day/30 Days Discharge Instructions: Secure with ACE bandage as directed. Secured With: [employer], 4.5x3.1 (in/yd) Other:2 times per day/30 Days Discharge Instructions: Secure with Kerlix as directed. Secured With: Transpore Surgical Tape, 2x10 (in/yd) Other:2 times per day/30 Days Discharge Instructions: Secure dressing with tape as directed. Wound #2 - Foot Wound Laterality: Dorsal, Right Cleanser: Soap and Water Other:2 times per day/30 Days Discharge Instructions: May shower and wash wound with dial antibacterial soap and water prior to dressing change. Cleanser: Wound Cleanser Other:2 times per day/30 Days Discharge Instructions: Cleanse the wound with wound cleanser prior to applying a clean dressing using gauze sponges, not tissue or cotton balls. Prim Dressing: Dakin's Solution 0.125%, 16 (oz) Other:2 times per day/30 Days ary Discharge Instructions: Moisten gauze with Dakin's solution and lay on top of wound bed Secondary Dressing: ABD Pad, 8x10 (Generic) Other:2 times per day/30 Days Discharge Instructions: Apply over primary dressing as directed. Secondary Dressing: NonWoven Sponge, 4x4 (in/in) (Generic) Other:2 times per day/30 Days Discharge Instructions: Apply over primary dressing as directed Secured With: Elastic Bandage 4 inch (ACE  bandage) Other:2 times per day/30 Days Discharge Instructions: Secure with ACE bandage as directed. Secured With: [employer], 4.5x3.1 (in/yd) Other:2 times per day/30 Days Discharge Instructions: Secure with Kerlix as directed. Secured With: Transpore Surgical Tape, 2x10 (in/yd) Other:2 times per day/30 Days Discharge Instructions: Secure dressing with tape as directed. Wound #3 - Ankle Wound Laterality: Left, Medial Cleanser: Soap and Water Other:2 times per day/30 Days Discharge Instructions: May shower and wash wound with dial antibacterial soap and water prior to dressing change. Cleanser: Wound Cleanser Other:2 times per day/30 Days Discharge Instructions: Cleanse the wound with wound cleanser prior to applying a clean dressing using gauze sponges, not tissue or cotton balls. Prim Dressing: Dakin's Solution 0.125%, 16 (oz) Other:2 times per day/30 Days ary Discharge Instructions: Moisten gauze with Dakin's solution and lay on top of wound bed Secondary Dressing: ABD Pad, 8x10 (Generic) Other:2 times per day/30 Days Discharge Instructions: Apply over primary dressing as directed. Secondary Dressing: NonWoven Sponge, 4x4 (in/in) (Generic) Other:2 times per day/30 Days Discharge Instructions: Apply over primary dressing as directed Secured With: Elastic Bandage 4 inch (ACE bandage) Other:2 times per day/30 Days Discharge Instructions: Secure with ACE bandage as directed. Secured With: [employer], 4.5x3.1 (in/yd) Other:2 times per day/30 Days Discharge Instructions: Secure with Kerlix as directed. Secured With: Transpore Surgical Tape, 2x10 (in/yd) Other:2 times per day/30 Days Discharge Instructions: Secure dressing with tape as directed. Wound #4 - Ankle Wound Laterality: Left, Lateral Cleanser: Soap and Water Other:2 times per day/30 Days Discharge Instructions: May shower and wash wound with dial antibacterial soap and water prior to dressing change. Cleanser: Wound  Cleanser Other:2 times per day/30 Days Discharge Instructions: Cleanse the wound with wound cleanser prior to applying a clean dressing using gauze sponges, not tissue or cotton balls. Prim Dressing: Dakin's Solution 0.125%, 16 (oz) Other:2 times per day/30 Days ary Discharge Instructions: Moisten gauze with Dakin's solution and lay on top of wound bed Secondary Dressing: ABD Pad, 8x10 (Generic) Other:2 times per day/30 Days Discharge Instructions: Apply over primary dressing as directed.  Secondary Dressing: NonWoven Sponge, 4x4 (in/in) (Generic) Other:2 times per day/30 Days Discharge Instructions: Apply over primary dressing as directed Secured With: Elastic Bandage 4 inch (ACE bandage) Other:2 times per day/30 Days Discharge Instructions: Secure with ACE bandage as directed. Secured With: [employer], 4.5x3.1 (in/yd) Other:2 times per day/30 Days Discharge Instructions: Secure with Kerlix as directed. Secured With: Transpore Surgical Tape, 2x10 (in/yd) Other:2 times per day/30 Days Discharge Instructions: Secure dressing with tape as directed. Wound #5 - Foot Wound Laterality: Dorsal, Left Cleanser: Soap and Water Other:2 times per day/30 Days Discharge Instructions: May shower and wash wound with dial antibacterial soap and water prior to dressing change. Cleanser: Wound Cleanser Other:2 times per day/30 Days Discharge Instructions: Cleanse the wound with wound cleanser prior to applying a clean dressing using gauze sponges, not tissue or cotton balls. Prim Dressing: Dakin's Solution 0.125%, 16 (oz) Other:2 times per day/30 Days ary Discharge Instructions: Moisten gauze with Dakin's solution and lay on top of wound bed Secondary Dressing: ABD Pad, 8x10 (Generic) Other:2 times per day/30 Days Discharge Instructions: Apply over primary dressing as directed. Secondary Dressing: NonWoven Sponge, 4x4 (in/in) (Generic) Other:2 times per day/30 Days Discharge Instructions: Apply over  primary dressing as directed Secured With: Elastic Bandage 4 inch (ACE bandage) Other:2 times per day/30 Days Discharge Instructions: Secure with ACE bandage as directed. Secured With: [employer], 4.5x3.1 (in/yd) Other:2 times per day/30 Days Discharge Instructions: Secure with Kerlix as directed. Secured With: Transpore Surgical Tape, 2x10 (in/yd) Other:2 times per day/30 Days Discharge Instructions: Secure dressing with tape as directed. Wound #6 - Foot Wound Laterality: Left, Medial, Distal Cleanser: Soap and Water Other:2 times per day/30 Days Discharge Instructions: May shower and wash wound with dial antibacterial soap and water prior to dressing change. Cleanser: Wound Cleanser Other:2 times per day/30 Days Discharge Instructions: Cleanse the wound with wound cleanser prior to applying a clean dressing using gauze sponges, not tissue or cotton balls. Prim Dressing: Dakin's Solution 0.125%, 16 (oz) Other:2 times per day/30 Days ary Discharge Instructions: Moisten gauze with Dakin's solution and lay on top of wound bed Secondary Dressing: ABD Pad, 8x10 (Generic) Other:2 times per day/30 Days Discharge Instructions: Apply over primary dressing as directed. Secondary Dressing: NonWoven Sponge, 4x4 (in/in) (Generic) Other:2 times per day/30 Days Discharge Instructions: Apply over primary dressing as directed Secured With: Elastic Bandage 4 inch (ACE bandage) Other:2 times per day/30 Days Discharge Instructions: Secure with ACE bandage as directed. Secured With: [employer], 4.5x3.1 (in/yd) Other:2 times per day/30 Days Discharge Instructions: Secure with Kerlix as directed. Secured With: Transpore Surgical Tape, 2x10 (in/yd) Other:2 times per day/30 Days Discharge Instructions: Secure dressing with tape as directed. Wound #7 - Ankle Wound Laterality: Right, Lateral Cleanser: Soap and Water Other:2 times per day/30 Days Discharge Instructions: May shower and wash wound  with dial antibacterial soap and water prior to dressing change. Cleanser: Wound Cleanser Other:2 times per day/30 Days Discharge Instructions: Cleanse the wound with wound cleanser prior to applying a clean dressing using gauze sponges, not tissue or cotton balls. Prim Dressing: Dakin's Solution 0.125%, 16 (oz) Other:2 times per day/30 Days ary Discharge Instructions: Moisten gauze with Dakin's solution and lay on top of wound bed Secondary Dressing: ABD Pad, 8x10 (Generic) Other:2 times per day/30 Days Discharge Instructions: Apply over primary dressing as directed. Secondary Dressing: NonWoven Sponge, 4x4 (in/in) (Generic) Other:2 times per day/30 Days Discharge Instructions: Apply over primary dressing as directed Secured With: Elastic Bandage 4 inch (ACE  bandage) Other:2 times per day/30 Days Discharge Instructions: Secure with ACE bandage as directed. Secured With: [employer], 4.5x3.1 (in/yd) Other:2 times per day/30 Days Discharge Instructions: Secure with Kerlix as directed. Secured With: Transpore Surgical Tape, 2x10 (in/yd) Other:2 times per day/30 Days Discharge Instructions: Secure dressing with tape as directed. Laboratory Erythrocyte sedimentation rate (HEM) LOINC Code: [phone] Convenience Name: Sed rate-method unspecified C reactive protein [Mass/volume] in Serum or Plasma (CHEM) LOINC Code: 1988-5 Convenience Name: C Reactive Protein in serum or plasma [employer] ANA hepatitis B and C - post exposure HIV - post exposure anti-double stranded DNA Patient Medications llergies: Motrin A Notifications Medication Indication Start End 05/17/2021 ciprofloxacin HCl DOSE 1 - oral 750 mg tablet - 1 tablet oral BID x 10 days 05/17/2021 Dakin's Solution DOSE 1 - miscellaneous 0.125 % solution - dakins moistened gauze for wet to dry dressings BID Electronic Signature(s) Signed: 05/17/2021 4:35:44 PM By: Kalman Shan DO Entered By: Kalman Shan on  05/17/2021 16:35:41 -------------------------------------------------------------------------------- Problem List Details Patient Name: Date of Service: [name], [name] 05/17/2021 2:00 PM Medical Record Number: [id number] Patient Account Number: [id number] Date of Birth/Sex: Treating RN: May 18, 1966 (55 y.o. Candace Robinson Primary Care Provider: [name] Other Clinician: Referring Provider: Treating Provider/Extender: Darcey Nora in Treatment: 12 Active Problems ICD-10 Encounter Code Description Active Date MDM Diagnosis [phone] Non-pressure chronic ulcer of other part of right foot with fat layer exposed 02/22/2021 No Yes L97.522 Non-pressure chronic ulcer of other part of left foot with fat layer exposed 02/22/2021 No Yes I73.9 Peripheral vascular disease, unspecified 02/22/2021 No Yes E11.9 Type 2 diabetes mellitus without complications 9/92/4268 No Yes L93.2 Other local lupus erythematosus 02/22/2021 No Yes T41.96 Chronic diastolic (congestive) heart failure 02/22/2021 No Yes I10 Essential (primary) hypertension 02/22/2021 No Yes E11.621 Type 2 diabetes mellitus with foot ulcer 02/22/2021 No Yes Inactive Problems Resolved Problems Electronic Signature(s) Signed: 05/17/2021 4:36:42 PM By: Kalman Shan DO Entered By: Kalman Shan on 05/17/2021 16:22:19 -------------------------------------------------------------------------------- Progress Note Details Patient Name: Date of Service: [name], [name] 05/17/2021 2:00 PM Medical Record Number: [id number] Patient Account Number: [id number] Date of Birth/Sex: Treating RN: [birth year] (55 y.o. Candace Robinson Primary Care Provider: [name] Other Clinician: Referring Provider: Treating Provider/Extender: Darcey Nora in Treatment: 12 Subjective Chief Complaint Information obtained from Patient Bilateral feet wounds History of  Present Illness (HPI) Admission 8/22 Ms. Shellia Hartl is a 55 year old female with a past medical history of diet-controlled type 2 diabetes, hypothyroidism, chronic diastolic heart failure, cutaneous lupus erythematosus and essential hypertension that presents to the clinic for a 74-month history of worsening bilateral feet ulcers. She states that wounds to her feet have been an ongoing issue for several years. She states that with wound care they improve however they tend to wax and wane. 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 She was discharged to a nursing facility and she was provided wound care for her bilateral feet ulcers at that time. She states they used Silvadene and silver alginate. She states that when she was discharged in April from the nursing facility her wounds had improved but were not fully closed. She currently uses Silvadene but has not been using silver alginate for several months due to running out. She reports significant decline in her wound healing over the last 4 months. She reports pain to the wound sites. She currently denies increased warmth erythema  or purulent drainage. 8/29; patient presents for 1 week follow-up. She states she had ABIs completed without TBI's. She continues to use silver alginate with dressing changes. She reports continued pain. She denies signs of infection. 9/8; patient presents for 1 week follow-up. She was evaluated by Dr. Donzetta Matters and she reports follow-up with him in 1 month. She has been using Santyl to the wound beds. She reports some improvement in wound healing. She denies signs of infection. 9/15; patient presents for follow-up. She continues to use Santyl and silver alginate to the wound beds. She states the alginate is sticking and hard to remove with dressing changes. She currently denies signs of infection. 9/22; patient presents for 1 week follow-up. She is using Santyl and silver alginate to the wound  beds. She ran out of [place] and has been using Silvadene. She states that gentamicin ointment is ready at her pharmacy and will be picking this up today. She has no issues or complaints today. She denies signs of infection. 9/30; she is using Santyl and silver alginate. She has substantial wounds on her bilateral feet and bilateral ankles that have been there for a year. She is in a lot of pain. She cannot bear to have these wounds touched let alone a mechanical debridement here. Furthermore the etiology of this is really not totally clear. She had reasonably normal ABIs with triphasic waveforms in August [I did not review this however]. She is completing a course of ciprofloxacin for an area on the left lateral ankle. She has had prior ablations and follows with vascular surgery. Finally she apparently has lupus 10/6; patient has been using Silvadene to the wound beds with dressing changes. She states she is picking up Santyl today. She has no issues or complaints today. She denies signs of infection. 10/17 the patient managed to get Santyl which the she is using to all wound beds. There is a large areas of breakdown here including substantially on the left distal foot, right foot involving the dorsal toes both medial ankles. All of these wounds are covered with some degree of very adherent fibrinous material. The patient did has an appointment with vascular surgery Dr. Donzetta Matters in 2 days time 10/24; patient presents for follow-up. She has been using Santyl and silver alginate to the wound beds. She states that she is going to be set up with rheumatology soon. She denies signs of infection. She overall reports stability in her wound healing. 11/7; patient presents for follow-up. She continues to use Santyl and silver alginate to the wound beds. She has an appointment set to see rheumatology on 07/06/2021. She reports some improvement in her wound healing. She currently denies signs of infection. 11/14;  patient presents for follow-up. She has been using Santyl and silver alginate to the wound beds. She has no issues or complaints today. She denies signs of infection. Patient History Information obtained from Patient. Family History Unknown History. Social History Never smoker, Marital Status - Single, Alcohol Use - Never, Drug Use - No History, Caffeine Use - Rarely. Medical History Hematologic/Lymphatic Patient has history of Anemia Cardiovascular Patient has history of Arrhythmia - A-Fib, Congestive Heart Failure, Hypertension Denies history of Angina Endocrine Patient has history of Type II Diabetes Immunological Patient has history of Lupus Erythematosus Medical A Surgical History Notes nd Endocrine Grave's disease Objective Constitutional respirations regular, non-labored and within target range for patient.. Vitals Time Taken: 2:02 PM, Height: 63 in, Source: Stated, Weight: 157 lbs, Source: Stated, BMI: 27.8, Temperature:  97.9 F, Pulse: 108 bpm, Respiratory Rate: 18 breaths/min, Blood Pressure: 107/70 mmHg. Cardiovascular 2+ dorsalis pedis/posterior tibialis pulses. Psychiatric pleasant and cooperative. General Notes: Wounds throughout the feet bilaterally with mostly nonviable tissue and scant granulation tissue present. No obvious signs of surrounding soft tissue infection. Integumentary (Hair, Skin) Wound #1 status is Open. Original cause of wound was Gradually Appeared. The date acquired was: 04/03/2020. The wound has been in treatment 12 weeks. The wound is located on the Right,Medial Ankle. The wound measures 5.9cm length x 5.3cm width x 0.3cm depth; 24.559cm^2 area and 7.368cm^3 volume. There is Fat Layer (Subcutaneous Tissue) exposed. There is no tunneling or undermining noted. There is a medium amount of serosanguineous drainage noted. The wound margin is distinct with the outline attached to the wound base. There is no granulation within the wound bed. There is  a large (67-100%) amount of necrotic tissue within the wound bed including Adherent Slough. Wound #2 status is Open. Original cause of wound was Gradually Appeared. The date acquired was: 04/03/2020. The wound has been in treatment 12 weeks. The wound is located on the Right,Dorsal Foot. The wound measures 4.9cm length x 5.3cm width x 0.1cm depth; 20.397cm^2 area and 2.04cm^3 volume. There is Fat Layer (Subcutaneous Tissue) exposed. There is no tunneling or undermining noted. There is a medium amount of serosanguineous drainage noted. The wound margin is distinct with the outline attached to the wound base. There is small (1-33%) red granulation within the wound bed. There is a large (67-100%) amount of necrotic tissue within the wound bed including Adherent Slough. Wound #3 status is Open. Original cause of wound was Gradually Appeared. The date acquired was: 02/22/2021. The wound has been in treatment 12 weeks. The wound is located on the Left,Medial Ankle. The wound measures 1cm length x 2.8cm width x 0.3cm depth; 2.199cm^2 area and 0.66cm^3 volume. There is Fat Layer (Subcutaneous Tissue) exposed. There is no tunneling or undermining noted. There is a medium amount of serosanguineous drainage noted. The wound margin is distinct with the outline attached to the wound base. There is no granulation within the wound bed. There is a large (67-100%) amount of necrotic tissue within the wound bed. Wound #4 status is Open. Original cause of wound was Gradually Appeared. The date acquired was: 04/03/2020. The wound has been in treatment 12 weeks. The wound is located on the Left,Lateral Ankle. The wound measures 8.1cm length x 5.4cm width x 0.4cm depth; 34.353cm^2 area and 13.741cm^3 volume. There is Fat Layer (Subcutaneous Tissue) exposed. There is no tunneling or undermining noted. There is a medium amount of serosanguineous drainage noted. The wound margin is distinct with the outline attached to the wound  base. There is no granulation within the wound bed. There is a large (67-100%) amount of necrotic tissue within the wound bed including Adherent Slough. Wound #5 status is Open. Original cause of wound was Gradually Appeared. The date acquired was: 04/03/2020. The wound has been in treatment 12 weeks. The wound is located on the Left,Dorsal Foot. The wound measures 10cm length x 8.1cm width x 0.3cm depth; 63.617cm^2 area and 19.085cm^3 volume. There is Fat Layer (Subcutaneous Tissue) exposed. There is no tunneling or undermining noted. There is a medium amount of serosanguineous drainage noted. The wound margin is distinct with the outline attached to the wound base. There is small (1-33%) red granulation within the wound bed. There is a large (67-100%) amount of necrotic tissue within the wound bed including Eschar and Adherent Slough.  Wound #6 status is Open. Original cause of wound was Gradually Appeared. The date acquired was: 04/03/2020. The wound has been in treatment 12 weeks. The wound is located on the Left,Distal,Medial Foot. The wound measures 1.4cm length x 1cm width x 0.1cm depth; 1.1cm^2 area and 0.11cm^3 volume. There is Fat Layer (Subcutaneous Tissue) exposed. There is no tunneling or undermining noted. There is a small amount of serosanguineous drainage noted. The wound margin is distinct with the outline attached to the wound base. There is small (1-33%) red, pink granulation within the wound bed. There is a large (67- 100%) amount of necrotic tissue within the wound bed including Adherent Slough. Wound #7 status is Open. Original cause of wound was Gradually Appeared. The date acquired was: 04/08/2021. The wound has been in treatment 5 weeks. The wound is located on the Right,Lateral Ankle. The wound measures 1.5cm length x 1.3cm width x 0.2cm depth; 1.532cm^2 area and 0.306cm^3 volume. There is Fat Layer (Subcutaneous Tissue) exposed. There is no tunneling or undermining noted. There  is a medium amount of serosanguineous drainage noted. The wound margin is distinct with the outline attached to the wound base. There is small (1-33%) pink granulation within the wound bed. There is a large (67-100%) amount of necrotic tissue within the wound bed including Adherent Slough. Assessment Active Problems ICD-10 Non-pressure chronic ulcer of other part of right foot with fat layer exposed Non-pressure chronic ulcer of other part of left foot with fat layer exposed Peripheral vascular disease, unspecified Type 2 diabetes mellitus without complications Other local lupus erythematosus Chronic diastolic (congestive) heart failure Essential (primary) hypertension Type 2 diabetes mellitus with foot ulcer Patient's wounds are stable. She had a punch biopsy for tissue culture and Derm path. Her culture was positive for Pseudomonas aeruginosa and Morganella Morgagni. Will start on ciprofloxacin. Her biopsy showed benign necrotic squamous epithelium and underlying connective tissue. It was negative for granulomas or neoplasm. At this time I recommended Dakin's wet-to-dry dressings. Patient cannot tolerate debridement. I do not think Annitta Needs has been working very well. Patient has an appointment with rheumatology in 1-1/2 months. I would like to obtain basic lupus labs. I will obtain an ANA, antidouble- stranded DNA, CRP and sed rate. Follow-up in 1 week. Plan Follow-up Appointments: Return Appointment in 1 week. - with Dr. Heber Sherburn *****EXTRA TIME - 16 MINUTES***** Other: - [facility]=Supplies Bathing/ Shower/ Hygiene: May shower and wash wound with soap and water. - when dressings changed Edema Control - Lymphedema / SCD / Other: Elevate legs to the level of the heart or above for 30 minutes daily and/or when sitting, a frequency of: - throughout the day Avoid standing for long periods of time. Additional Orders / Instructions: Follow Nutritious Diet - 100-120g of protein  daily ordered were: ANA, hepatitis B and C - post exposure, HIV - post exposure, anti-double stranded DNA Laboratory ordered were: Sed rate -method unspecified, C Reactive Protein in serum or plasma The following medication(s) was prescribed: ciprofloxacin HCl oral 750 mg tablet 1 1 tablet oral BID x 10 days starting 05/17/2021 Dakin's Solution miscellaneous 0.125 % solution 1 dakins moistened gauze for wet to dry dressings BID starting 05/17/2021 WOUND #1: - Ankle Wound Laterality: Right, Medial Cleanser: Soap and Water Other:2 times per day/30 Days Discharge Instructions: May shower and wash wound with dial antibacterial soap and water prior to dressing change. Cleanser: Wound Cleanser Other:2 times per day/30 Days Discharge Instructions: Cleanse the wound with wound cleanser prior to applying a clean  dressing using gauze sponges, not tissue or cotton balls. Prim Dressing: Dakin's Solution 0.125%, 16 (oz) Other:2 times per day/30 Days ary Discharge Instructions: Moisten gauze with Dakin's solution and lay on top of wound bed Secondary Dressing: ABD Pad, 8x10 (Generic) Other:2 times per day/30 Days Discharge Instructions: Apply over primary dressing as directed. Secondary Dressing: NonWoven Sponge, 4x4 (in/in) (Generic) Other:2 times per day/30 Days Discharge Instructions: Apply over primary dressing as directed Secured With: Elastic Bandage 4 inch (ACE bandage) Other:2 times per day/30 Days Discharge Instructions: Secure with ACE bandage as directed. Secured With: [employer], 4.5x3.1 (in/yd) Other:2 times per day/30 Days Discharge Instructions: Secure with Kerlix as directed. Secured With: [employer] T ape, 2x10 (in/yd) Other:2 times per day/30 Days Discharge Instructions: Secure dressing with tape as directed. WOUND #2: - Foot Wound Laterality: Dorsal, Right Cleanser: Soap and Water Other:2 times per day/30 Days Discharge Instructions: May shower and wash wound with  dial antibacterial soap and water prior to dressing change. Cleanser: Wound Cleanser Other:2 times per day/30 Days Discharge Instructions: Cleanse the wound with wound cleanser prior to applying a clean dressing using gauze sponges, not tissue or cotton balls. Prim Dressing: Dakin's Solution 0.125%, 16 (oz) Other:2 times per day/30 Days ary Discharge Instructions: Moisten gauze with Dakin's solution and lay on top of wound bed Secondary Dressing: ABD Pad, 8x10 (Generic) Other:2 times per day/30 Days Discharge Instructions: Apply over primary dressing as directed. Secondary Dressing: NonWoven Sponge, 4x4 (in/in) (Generic) Other:2 times per day/30 Days Discharge Instructions: Apply over primary dressing as directed Secured With: Elastic Bandage 4 inch (ACE bandage) Other:2 times per day/30 Days Discharge Instructions: Secure with ACE bandage as directed. Secured With: [employer], 4.5x3.1 (in/yd) Other:2 times per day/30 Days Discharge Instructions: Secure with Kerlix as directed. Secured With: [employer] T ape, 2x10 (in/yd) Other:2 times per day/30 Days Discharge Instructions: Secure dressing with tape as directed. WOUND #3: - Ankle Wound Laterality: Left, Medial Cleanser: Soap and Water Other:2 times per day/30 Days Discharge Instructions: May shower and wash wound with dial antibacterial soap and water prior to dressing change. Cleanser: Wound Cleanser Other:2 times per day/30 Days Discharge Instructions: Cleanse the wound with wound cleanser prior to applying a clean dressing using gauze sponges, not tissue or cotton balls. Prim Dressing: Dakin's Solution 0.125%, 16 (oz) Other:2 times per day/30 Days ary Discharge Instructions: Moisten gauze with Dakin's solution and lay on top of wound bed Secondary Dressing: ABD Pad, 8x10 (Generic) Other:2 times per day/30 Days Discharge Instructions: Apply over primary dressing as directed. Secondary Dressing: NonWoven Sponge, 4x4  (in/in) (Generic) Other:2 times per day/30 Days Discharge Instructions: Apply over primary dressing as directed Secured With: Elastic Bandage 4 inch (ACE bandage) Other:2 times per day/30 Days Discharge Instructions: Secure with ACE bandage as directed. Secured With: [employer], 4.5x3.1 (in/yd) Other:2 times per day/30 Days Discharge Instructions: Secure with Kerlix as directed. Secured With: [employer] T ape, 2x10 (in/yd) Other:2 times per day/30 Days Discharge Instructions: Secure dressing with tape as directed. WOUND #4: - Ankle Wound Laterality: Left, Lateral Cleanser: Soap and Water Other:2 times per day/30 Days Discharge Instructions: May shower and wash wound with dial antibacterial soap and water prior to dressing change. Cleanser: Wound Cleanser Other:2 times per day/30 Days Discharge Instructions: Cleanse the wound with wound cleanser prior to applying a clean dressing using gauze sponges, not tissue or cotton balls. Prim Dressing: Dakin's Solution 0.125%, 16 (oz) Other:2 times per day/30 Days ary Discharge Instructions: Moisten  gauze with Dakin's solution and lay on top of wound bed Secondary Dressing: ABD Pad, 8x10 (Generic) Other:2 times per day/30 Days Discharge Instructions: Apply over primary dressing as directed. Secondary Dressing: NonWoven Sponge, 4x4 (in/in) (Generic) Other:2 times per day/30 Days Discharge Instructions: Apply over primary dressing as directed Secured With: Elastic Bandage 4 inch (ACE bandage) Other:2 times per day/30 Days Discharge Instructions: Secure with ACE bandage as directed. Secured With: [employer], 4.5x3.1 (in/yd) Other:2 times per day/30 Days Discharge Instructions: Secure with Kerlix as directed. Secured With: [employer] T ape, 2x10 (in/yd) Other:2 times per day/30 Days Discharge Instructions: Secure dressing with tape as directed. WOUND #5: - Foot Wound Laterality: Dorsal, Left Cleanser: Soap and Water  Other:2 times per day/30 Days Discharge Instructions: May shower and wash wound with dial antibacterial soap and water prior to dressing change. Cleanser: Wound Cleanser Other:2 times per day/30 Days Discharge Instructions: Cleanse the wound with wound cleanser prior to applying a clean dressing using gauze sponges, not tissue or cotton balls. Prim Dressing: Dakin's Solution 0.125%, 16 (oz) Other:2 times per day/30 Days ary Discharge Instructions: Moisten gauze with Dakin's solution and lay on top of wound bed Secondary Dressing: ABD Pad, 8x10 (Generic) Other:2 times per day/30 Days Discharge Instructions: Apply over primary dressing as directed. Secondary Dressing: NonWoven Sponge, 4x4 (in/in) (Generic) Other:2 times per day/30 Days Discharge Instructions: Apply over primary dressing as directed Secured With: Elastic Bandage 4 inch (ACE bandage) Other:2 times per day/30 Days Discharge Instructions: Secure with ACE bandage as directed. Secured With: [employer], 4.5x3.1 (in/yd) Other:2 times per day/30 Days Discharge Instructions: Secure with Kerlix as directed. Secured With: [employer] T ape, 2x10 (in/yd) Other:2 times per day/30 Days Discharge Instructions: Secure dressing with tape as directed. WOUND #6: - Foot Wound Laterality: Left, Medial, Distal Cleanser: Soap and Water Other:2 times per day/30 Days Discharge Instructions: May shower and wash wound with dial antibacterial soap and water prior to dressing change. Cleanser: Wound Cleanser Other:2 times per day/30 Days Discharge Instructions: Cleanse the wound with wound cleanser prior to applying a clean dressing using gauze sponges, not tissue or cotton balls. Prim Dressing: Dakin's Solution 0.125%, 16 (oz) Other:2 times per day/30 Days ary Discharge Instructions: Moisten gauze with Dakin's solution and lay on top of wound bed Secondary Dressing: ABD Pad, 8x10 (Generic) Other:2 times per day/30 Days Discharge  Instructions: Apply over primary dressing as directed. Secondary Dressing: NonWoven Sponge, 4x4 (in/in) (Generic) Other:2 times per day/30 Days Discharge Instructions: Apply over primary dressing as directed Secured With: Elastic Bandage 4 inch (ACE bandage) Other:2 times per day/30 Days Discharge Instructions: Secure with ACE bandage as directed. Secured With: [employer], 4.5x3.1 (in/yd) Other:2 times per day/30 Days Discharge Instructions: Secure with Kerlix as directed. Secured With: [employer] T ape, 2x10 (in/yd) Other:2 times per day/30 Days Discharge Instructions: Secure dressing with tape as directed. WOUND #7: - Ankle Wound Laterality: Right, Lateral Cleanser: Soap and Water Other:2 times per day/30 Days Discharge Instructions: May shower and wash wound with dial antibacterial soap and water prior to dressing change. Cleanser: Wound Cleanser Other:2 times per day/30 Days Discharge Instructions: Cleanse the wound with wound cleanser prior to applying a clean dressing using gauze sponges, not tissue or cotton balls. Prim Dressing: Dakin's Solution 0.125%, 16 (oz) Other:2 times per day/30 Days ary Discharge Instructions: Moisten gauze with Dakin's solution and lay on top of wound bed Secondary Dressing: ABD Pad, 8x10 (Generic) Other:2 times per day/30 Days Discharge Instructions:  Apply over primary dressing as directed. Secondary Dressing: NonWoven Sponge, 4x4 (in/in) (Generic) Other:2 times per day/30 Days Discharge Instructions: Apply over primary dressing as directed Secured With: Elastic Bandage 4 inch (ACE bandage) Other:2 times per day/30 Days Discharge Instructions: Secure with ACE bandage as directed. Secured With: [employer], 4.5x3.1 (in/yd) Other:2 times per day/30 Days Discharge Instructions: Secure with Kerlix as directed. Secured With: [employer] T ape, 2x10 (in/yd) Other:2 times per day/30 Days Discharge Instructions: Secure dressing with  tape as directed. 1. Dakin's wet-to-dry 2. Lupus work-up 3. Follow-up in 1 week 4. Ciprofloxacin Electronic Signature(s) Signed: 05/17/2021 4:36:42 PM By: Kalman Shan DO Entered By: Kalman Shan on 05/17/2021 16:36:12 -------------------------------------------------------------------------------- HxROS Details Patient Name: Date of Service: [name], [name] 05/17/2021 2:00 PM Medical Record Number: [id number] Patient Account Number: [id number] Date of Birth/Sex: Treating RN: [birth year] (55 y.o. Candace Robinson Primary Care Provider: [name] Other Clinician: Referring Provider: Treating Provider/Extender: Darcey Nora in Treatment: 12 Information Obtained From Patient Hematologic/Lymphatic Medical History: Positive for: Anemia Cardiovascular Medical History: Positive for: Arrhythmia - A-Fib; Congestive Heart Failure; Hypertension Negative for: Angina Endocrine Medical History: Positive for: Type II Diabetes Past Medical History Notes: Grave's disease Time with diabetes: over 10 years Treated with: Diet Blood sugar tested every day: No Immunological Medical History: Positive for: Lupus Erythematosus Immunizations Pneumococcal Vaccine: Received Pneumococcal Vaccination: No Implantable Devices None Family and Social History Unknown History: Yes; Never smoker; Marital Status - Single; Alcohol Use: Never; Drug Use: No History; Caffeine Use: Rarely; Financial Concerns: No; Food, Clothing or Shelter Needs: No; Support System Lacking: No; Transportation Concerns: No Electronic Signature(s) Signed: 05/17/2021 4:35:28 PM By: Lorrin Jackson Signed: 05/17/2021 4:36:42 PM By: Kalman Shan DO Entered By: Kalman Shan on 05/17/2021 16:23:51 -------------------------------------------------------------------------------- SuperBill Details Patient Name: Date of Service: [name], [name] 05/17/2021 Medical  Record Number: [id number] Patient Account Number: [id number] Date of Birth/Sex: Treating RN: 11/22/1965 (55 y.o. Candace Robinson Primary Care Provider: [name] Other Clinician: Referring Provider: Treating Provider/Extender: Darcey Nora in Treatment: 12 Diagnosis Coding ICD-10 Codes Code Description [phone] Non-pressure chronic ulcer of other part of right foot with fat layer exposed L97.522 Non-pressure chronic ulcer of other part of left foot with fat layer exposed I73.9 Peripheral vascular disease, unspecified E11.9 Type 2 diabetes mellitus without complications X83.3 Other local lupus erythematosus A25.05 Chronic diastolic (congestive) heart failure I10 Essential (primary) hypertension E11.621 Type 2 diabetes mellitus with foot ulcer Physician Procedures : CPT4 Code Description Modifier [id number] - WC PHYS LEVEL 4 - EST PT ICD-10 Diagnosis Description L97.512 Non-pressure chronic ulcer of other part of right foot with fat layer exposed L97.522 Non-pressure chronic ulcer of other part of left foot  with fat layer exposed E11.621 Type 2 diabetes mellitus with foot ulcer L93.2 Other local lupus erythematosus Quantity: 1 Electronic Signature(s) Signed: 05/17/2021 4:36:42 PM By: Kalman Shan DO Entered By: Kalman Shan on 05/17/2021 16:36:23

## 2021-05-17 NOTE — Progress Notes (Addendum)
GEANNA, DIVIRGILIO ([id number]) Visit Report for 05/17/2021 Arrival Information Details Patient Name: Date of Service: Candace Robinson, [name] 05/17/2021 2:00 PM Medical Record Number: [id number] Patient Account Number: [id number] Date of Birth/Sex: Treating RN: 1965-11-27 (55 y.o. Candace Robinson, Candace Robinson Primary Care Candace Robinson: Candace Robinson Other Clinician: Referring Candace Robinson: Treating Candace Robinson/Extender: Candace Robinson in Treatment: 12 Visit Information History Since Last Visit Added or deleted any medications: No Patient Arrived: Candace Robinson Any new allergies or adverse reactions: No Arrival Time: 14:01 Had Robinson fall or experienced change in No Accompanied By: self activities of daily living that may affect Transfer Assistance: None risk of falls: Patient Identification Verified: Yes Signs or symptoms of abuse/neglect since No Secondary Verification Process Completed: Yes last visito Patient Requires Transmission-Based No Hospitalized since last visit: No Precautions: Implantable device outside of the clinic No Patient Has Alerts: Yes excluding Patient Alerts: R ABI: 1.0 L ABI: 1.1 cellular tissue based products placed in the NO BENZOCAINE center USE LIDOCAINE GEL since last visit: ONLY Has Dressing in Place as Prescribed: Yes Has Footwear/Offloading in Place as Yes Prescribed: Left: Surgical Shoe with Pressure Relief Insole Right: Surgical Shoe with Pressure Relief Insole Pain Present Now: Yes Electronic Signature(s) Signed: 05/17/2021 4:40:13 PM By: Baruch Gouty RN, BSN Entered By: Baruch Gouty on 05/17/2021 14:02:32 -------------------------------------------------------------------------------- Clinic Level of Care Assessment Details Patient Name: Date of Service: Candace Robinson, [name] 05/17/2021 2:00 PM Medical Record Number: [id number] Patient Account Number: [id number] Date of Birth/Sex: Treating RN: 1965/12/13 (55 y.o. Candace Robinson Primary Care Candace Robinson: Candace Robinson Other Clinician: Referring Candace Robinson: Treating Candace Robinson/Extender: Candace Robinson in Treatment: 12 Clinic Level of Care Assessment Items TOOL 4 Quantity Score Robinson- 1 0 Use when only an EandM is performed on FOLLOW-UP visit ASSESSMENTS - Nursing Assessment / Reassessment Robinson- 1 10 Reassessment of Co-morbidities (includes updates in patient status) Robinson- 1 5 Reassessment of Adherence to Treatment Plan ASSESSMENTS - Wound and Skin Robinson ssessment / Reassessment []  - 0 Simple Wound Assessment / Reassessment - one wound Robinson- 7 5 Complex Wound Assessment / Reassessment - multiple wounds []  - 0 Dermatologic / Skin Assessment (not related to wound area) ASSESSMENTS - Focused Assessment []  - 0 Circumferential Edema Measurements - multi extremities []  - 0 Nutritional Assessment / Counseling / Intervention []  - 0 Lower Extremity Assessment (monofilament, tuning fork, pulses) []  - 0 Peripheral Arterial Disease Assessment (using hand held doppler) ASSESSMENTS - Ostomy and/or Continence Assessment and Care []  - 0 Incontinence Assessment and Management []  - 0 Ostomy Care Assessment and Management (repouching, etc.) PROCESS - Coordination of Care []  - 0 Simple Patient / Family Education for ongoing care Robinson- 1 20 Complex (extensive) Patient / Family Education for ongoing care []  - 0 Staff obtains Programmer, systems, Records, T Results / Process Orders est []  - 0 Staff telephones HHA, Nursing Homes / Clarify orders / etc []  - 0 Routine Transfer to another Facility (non-emergent condition) []  - 0 Routine Hospital Admission (non-emergent condition) []  - 0 New Admissions / Biomedical engineer / Ordering NPWT Apligraf, etc. , []  - 0 Emergency Hospital Admission (emergent condition) []  - 0 Simple Discharge Coordination []  - 0 Complex (extensive) Discharge Coordination PROCESS - Special Needs []  - 0 Pediatric / Minor Patient  Management []  - 0 Isolation Patient Management []  - 0 Hearing / Language / Visual special needs []  - 0 Assessment of Community assistance (transportation, D/C planning, etc.) []  -  0 Additional assistance / Altered mentation []  - 0 Support Surface(s) Assessment (bed, cushion, seat, etc.) INTERVENTIONS - Wound Cleansing / Measurement []  - 0 Simple Wound Cleansing - one wound Robinson- 7 5 Complex Wound Cleansing - multiple wounds Robinson- 1 5 Wound Imaging (photographs - any number of wounds) []  - 0 Wound Tracing (instead of photographs) []  - 0 Simple Wound Measurement - one wound Robinson- 7 5 Complex Wound Measurement - multiple wounds INTERVENTIONS - Wound Dressings []  - 0 Small Wound Dressing one or multiple wounds Robinson- 2 15 Medium Wound Dressing one or multiple wounds []  - 0 Large Wound Dressing one or multiple wounds []  - 0 Application of Medications - topical []  - 0 Application of Medications - injection INTERVENTIONS - Miscellaneous []  - 0 External ear exam []  - 0 Specimen Collection (cultures, biopsies, blood, body fluids, etc.) []  - 0 Specimen(s) / Culture(s) sent or taken to Lab for analysis []  - 0 Patient Transfer (multiple staff / Civil Service fast streamer / Similar devices) []  - 0 Simple Staple / Suture removal (25 or less) []  - 0 Complex Staple / Suture removal (26 or more) []  - 0 Hypo / Hyperglycemic Management (close monitor of Blood Glucose) []  - 0 Ankle / Brachial Index (ABI) - do not check if billed separately Robinson- 1 5 Vital Signs Has the patient been seen at the hospital within the last three years: Yes Total Score: 180 Level Of Care: New/Established - Level 5 Electronic Signature(s) Signed: 05/25/2021 4:53:59 PM By: Lorrin Jackson Entered By: Lorrin Jackson on 05/25/2021 13:20:39 -------------------------------------------------------------------------------- Encounter Discharge Information Details Patient Name: Date of Service: Candace Robinson Candace Robinson, [name] 05/17/2021 2:00  PM Medical Record Number: [id number] Patient Account Number: [id number] Date of Birth/Sex: Treating RN: [birth year] (55 y.o. [name] Primary Care Vessie Olmsted: Candace Robinson Other Clinician: Referring Candace Robinson: Treating Candace Robinson/Extender: Candace Robinson in Treatment: 12 Encounter Discharge Information Items Discharge Condition: Stable Ambulatory Status: Walker Discharge Destination: Home Transportation: Other Accompanied By: self Schedule Follow-up Appointment: Yes Clinical Summary of Care: Patient Declined Notes transportation service Electronic Signature(s) Signed: 05/17/2021 4:40:13 PM By: Baruch Gouty RN, BSN Entered By: Baruch Gouty on 05/17/2021 15:43:34 -------------------------------------------------------------------------------- Lower Extremity Assessment Details Patient Name: Date of Service: Candace Robinson, [name] 05/17/2021 2:00 PM Medical Record Number: [id number] Patient Account Number: [id number] Date of Birth/Sex: Treating RN: 12/12/1965 (55 y.o. [name] Primary Care Fender Herder: Candace Robinson Other Clinician: Referring Rahima Fleishman: Treating Yolani Vo/Extender: [name] Weeks in Treatment: 12 Edema Assessment Assessed: [Left: No] [Right: No] Edema: [Left: Yes] [Right: Yes] Calf Left: Right: Point of Measurement: 29 cm From Medial Instep 36.3 cm 36.5 cm Ankle Left: Right: Point of Measurement: 10 cm From Medial Instep 25.5 cm 26 cm Vascular Assessment Pulses: Dorsalis Pedis Palpable: [Left:No] [Right:No] Electronic Signature(s) Signed: 05/17/2021 4:40:13 PM By: Baruch Gouty RN, BSN Entered By: Baruch Gouty on 05/17/2021 14:08:34 -------------------------------------------------------------------------------- Multi Wound Chart Details Patient Name: Date of Service: Candace Robinson, [name] 05/17/2021 2:00 PM Medical Record Number: [id number] Patient Account Number:  [id number] Date of Birth/Sex: Treating RN: 1966/07/02 (55 y.o. Candace Robinson Primary Care Davonne Jarnigan: Candace Robinson Other Clinician: Referring Hebah Bogosian: Treating Rushie Brazel/Extender: [name] Weeks in Treatment: 12 Vital Signs Height(in): 63 Pulse(bpm): 108 Weight(lbs): 157 Blood Pressure(mmHg): 107/70 Body Mass Index(BMI): 28 Temperature(F): 97.9 Respiratory Rate(breaths/min): 18 Photos: Right, Medial Ankle Right, Dorsal Foot Left, Medial Ankle Wound Location: Gradually Appeared Gradually Appeared Gradually  Appeared Wounding Event: Diabetic Wound/Ulcer of the Lower Diabetic Wound/Ulcer of the Lower Diabetic Wound/Ulcer of the Lower Primary Etiology: Extremity Extremity Extremity Anemia, Arrhythmia, Congestive Heart Anemia, Arrhythmia, Congestive Heart Anemia, Arrhythmia, Congestive Heart Comorbid History: Failure, Hypertension, Type II Failure, Hypertension, Type II Failure, Hypertension, Type II Diabetes, Lupus Erythematosus Diabetes, Lupus Erythematosus Diabetes, Lupus Erythematosus 04/03/2020 04/03/2020 02/22/2021 Date Acquired: 12 12 12  Weeks of Treatment: Open Open Open Wound Status: 5.9x5.3x0.3 4.9x5.3x0.1 1x2.8x0.3 Measurements [name] [name] D (cm) 24.559 20.397 2.199 Robinson (cm) : rea 7.368 2.04 0.66 Volume (cm) : -48.90% -73.10% -12.00% % Reduction in Robinson rea: -123.30% -73.20% -67.90% % Reduction in Volume: Grade 2 Grade 2 Grade 2 Classification: Medium Medium Medium Exudate Robinson mount: Serosanguineous Serosanguineous Serosanguineous Exudate Type: red, brown red, brown red, brown Exudate Color: Distinct, outline attached Distinct, outline attached Distinct, outline attached Wound Margin: None Present (0%) Small (1-33%) None Present (0%) Granulation Robinson mount: Robinson/Robinson Red Robinson/Robinson Granulation Quality: Large (67-100%) Large (67-100%) Large (67-100%) Necrotic Robinson mount: Adherent Slough Adherent Slough Robinson/Robinson Necrotic Tissue: Fat Layer (Subcutaneous  Tissue): Yes Fat Layer (Subcutaneous Tissue): Yes Fat Layer (Subcutaneous Tissue): Yes Exposed Structures: Fascia: No Fascia: No Fascia: No Tendon: No Tendon: No Tendon: No Muscle: No Muscle: No Muscle: No Joint: No Joint: No Joint: No Bone: No Bone: No Bone: No Small (1-33%) Small (1-33%) Small (1-33%) Epithelialization: Wound Number: 4 5 6  Photos: Left, Lateral Ankle Left, Dorsal Foot Left, Distal, Medial Foot Wound Location: Gradually Appeared Gradually Appeared Gradually Appeared Wounding Event: Diabetic Wound/Ulcer of the Lower Diabetic Wound/Ulcer of the Lower Diabetic Wound/Ulcer of the Lower Primary Etiology: Extremity Extremity Extremity Anemia, Arrhythmia, Congestive Heart Anemia, Arrhythmia, Congestive Heart Anemia, Arrhythmia, Congestive Heart Comorbid History: Failure, Hypertension, Type II Failure, Hypertension, Type II Failure, Hypertension, Type II Diabetes, Lupus Erythematosus Diabetes, Lupus Erythematosus Diabetes, Lupus Erythematosus 04/03/2020 04/03/2020 04/03/2020 Date Acquired: 12 12 12  Weeks of Treatment: Open Open Open Wound Status: 8.1x5.4x0.4 10x8.1x0.3 1.4x1x0.1 Measurements [name] [name] D (cm) 34.353 63.617 1.1 Robinson (cm) : rea 13.741 19.085 0.11 Volume (cm) : 8.90% -5.90% -250.30% % Reduction in Robinson rea: -82.20% -217.70% -254.80% % Reduction in Volume: Grade 2 Grade 2 Grade 2 Classification: Medium Medium Small Exudate Robinson mount: Serosanguineous Serosanguineous Serosanguineous Exudate Type: red, brown red, brown red, brown Exudate Color: Distinct, outline attached Distinct, outline attached Distinct, outline attached Wound Margin: None Present (0%) Small (1-33%) Small (1-33%) Granulation Robinson mount: Robinson/Robinson Red Red, Pink Granulation Quality: Large (67-100%) Large (67-100%) Large (67-100%) Necrotic Robinson mount: Adherent Slough Eschar, Adherent Slough Adherent Slough Necrotic Tissue: Fat Layer (Subcutaneous Tissue): Yes Fat Layer (Subcutaneous  Tissue): Yes Fat Layer (Subcutaneous Tissue): Yes Exposed Structures: Fascia: No Fascia: No Fascia: No Tendon: No Tendon: No Tendon: No Muscle: No Muscle: No Muscle: No Joint: No Joint: No Joint: No Bone: No Bone: No Bone: No None Small (1-33%) Medium (34-66%) Epithelialization: Wound Number: 7 Robinson/Robinson Robinson/Robinson Photos: Robinson/Robinson Robinson/Robinson Right, Lateral Ankle Robinson/Robinson Robinson/Robinson Wound Location: Gradually Appeared Robinson/Robinson Robinson/Robinson Wounding Event: Lupus Robinson/Robinson Robinson/Robinson Primary Etiology: Anemia, Arrhythmia, Congestive Heart Robinson/Robinson Robinson/Robinson Comorbid History: Failure, Hypertension, Type II Diabetes, Lupus Erythematosus 04/08/2021 Robinson/Robinson Robinson/Robinson Date Acquired: 5 Robinson/Robinson Robinson/Robinson Weeks of Treatment: Open Robinson/Robinson Robinson/Robinson Wound Status: 1.5x1.3x0.2 Robinson/Robinson Robinson/Robinson Measurements [name] [name] D (cm) 1.532 Robinson/Robinson Robinson/Robinson Robinson (cm) : rea 0.306 Robinson/Robinson Robinson/Robinson Volume (cm) : -4841.90% Robinson/Robinson Robinson/Robinson % Reduction in Area: -10100.00% Robinson/Robinson Robinson/Robinson % Reduction in Volume: Full Thickness Without Exposed Robinson/Robinson Robinson/Robinson Classification: Support Structures Medium  Robinson/Robinson Robinson/Robinson Exudate Amount: Serosanguineous Robinson/Robinson Robinson/Robinson Exudate Type: red, brown Robinson/Robinson Robinson/Robinson Exudate Color: Distinct, outline attached Robinson/Robinson Robinson/Robinson Wound Margin: Small (1-33%) Robinson/Robinson Robinson/Robinson Granulation Amount: Pink Robinson/Robinson Robinson/Robinson Granulation Quality: Large (67-100%) Robinson/Robinson Robinson/Robinson Necrotic Amount: Adherent Slough Robinson/Robinson Robinson/Robinson Necrotic Tissue: Fat Layer (Subcutaneous Tissue): Yes Robinson/Robinson Robinson/Robinson Exposed Structures: Fascia: No Tendon: No Muscle: No Joint: No Bone: No Small (1-33%) Robinson/Robinson Robinson/Robinson Epithelialization: Treatment Notes Wound #1 (Ankle) Wound Laterality: Right, Medial Cleanser Soap and Water Discharge Instruction: May shower and wash wound with dial antibacterial soap and water prior to dressing change. Wound Cleanser Discharge Instruction: Cleanse the wound with wound cleanser prior to applying Robinson clean dressing using gauze sponges, not tissue or cotton balls. Peri-Wound Care Topical Primary Dressing Dakin's Solution 0.125%, 16 (oz) Discharge Instruction: Moisten gauze with  Dakin's solution and lay on top of wound bed Secondary Dressing ABD Pad, 8x10 Discharge Instruction: Apply over primary dressing as directed. NonWoven Sponge, 4x4 (in/in) Discharge Instruction: Apply over primary dressing as directed Secured With Elastic Bandage 4 inch (ACE bandage) Discharge Instruction: Secure with ACE bandage as directed. Kerlix Roll Sterile, 4.5x3.1 (in/yd) Discharge Instruction: Secure with Kerlix as directed. Transpore Surgical Tape, 2x10 (in/yd) Discharge Instruction: Secure dressing with tape as directed. Compression Wrap Compression Stockings Add-Ons Wound #2 (Foot) Wound Laterality: Dorsal, Right Cleanser Soap and Water Discharge Instruction: May shower and wash wound with dial antibacterial soap and water prior to dressing change. Wound Cleanser Discharge Instruction: Cleanse the wound with wound cleanser prior to applying Robinson clean dressing using gauze sponges, not tissue or cotton balls. Peri-Wound Care Topical Primary Dressing Dakin's Solution 0.125%, 16 (oz) Discharge Instruction: Moisten gauze with Dakin's solution and lay on top of wound bed Secondary Dressing ABD Pad, 8x10 Discharge Instruction: Apply over primary dressing as directed. NonWoven Sponge, 4x4 (in/in) Discharge Instruction: Apply over primary dressing as directed Secured With Elastic Bandage 4 inch (ACE bandage) Discharge Instruction: Secure with ACE bandage as directed. Kerlix Roll Sterile, 4.5x3.1 (in/yd) Discharge Instruction: Secure with Kerlix as directed. Transpore Surgical Tape, 2x10 (in/yd) Discharge Instruction: Secure dressing with tape as directed. Compression Wrap Compression Stockings Add-Ons Wound #3 (Ankle) Wound Laterality: Left, Medial Cleanser Soap and Water Discharge Instruction: May shower and wash wound with dial antibacterial soap and water prior to dressing change. Wound Cleanser Discharge Instruction: Cleanse the wound with wound cleanser prior to  applying Robinson clean dressing using gauze sponges, not tissue or cotton balls. Peri-Wound Care Topical Primary Dressing Dakin's Solution 0.125%, 16 (oz) Discharge Instruction: Moisten gauze with Dakin's solution and lay on top of wound bed Secondary Dressing ABD Pad, 8x10 Discharge Instruction: Apply over primary dressing as directed. NonWoven Sponge, 4x4 (in/in) Discharge Instruction: Apply over primary dressing as directed Secured With Elastic Bandage 4 inch (ACE bandage) Discharge Instruction: Secure with ACE bandage as directed. Kerlix Roll Sterile, 4.5x3.1 (in/yd) Discharge Instruction: Secure with Kerlix as directed. Transpore Surgical Tape, 2x10 (in/yd) Discharge Instruction: Secure dressing with tape as directed. Compression Wrap Compression Stockings Add-Ons Wound #4 (Ankle) Wound Laterality: Left, Lateral Cleanser Soap and Water Discharge Instruction: May shower and wash wound with dial antibacterial soap and water prior to dressing change. Wound Cleanser Discharge Instruction: Cleanse the wound with wound cleanser prior to applying Robinson clean dressing using gauze sponges, not tissue or cotton balls. Peri-Wound Care Topical Primary Dressing Dakin's Solution 0.125%, 16 (oz) Discharge Instruction: Moisten gauze with Dakin's solution and lay on top of wound bed Secondary Dressing ABD Pad, 8x10 Discharge Instruction: Apply over primary  dressing as directed. NonWoven Sponge, 4x4 (in/in) Discharge Instruction: Apply over primary dressing as directed Secured With Elastic Bandage 4 inch (ACE bandage) Discharge Instruction: Secure with ACE bandage as directed. Kerlix Roll Sterile, 4.5x3.1 (in/yd) Discharge Instruction: Secure with Kerlix as directed. Transpore Surgical Tape, 2x10 (in/yd) Discharge Instruction: Secure dressing with tape as directed. Compression Wrap Compression Stockings Add-Ons Wound #5 (Foot) Wound Laterality: Dorsal, Left Cleanser Soap and  Water Discharge Instruction: May shower and wash wound with dial antibacterial soap and water prior to dressing change. Wound Cleanser Discharge Instruction: Cleanse the wound with wound cleanser prior to applying Robinson clean dressing using gauze sponges, not tissue or cotton balls. Peri-Wound Care Topical Primary Dressing Dakin's Solution 0.125%, 16 (oz) Discharge Instruction: Moisten gauze with Dakin's solution and lay on top of wound bed Secondary Dressing ABD Pad, 8x10 Discharge Instruction: Apply over primary dressing as directed. NonWoven Sponge, 4x4 (in/in) Discharge Instruction: Apply over primary dressing as directed Secured With Elastic Bandage 4 inch (ACE bandage) Discharge Instruction: Secure with ACE bandage as directed. Kerlix Roll Sterile, 4.5x3.1 (in/yd) Discharge Instruction: Secure with Kerlix as directed. Transpore Surgical Tape, 2x10 (in/yd) Discharge Instruction: Secure dressing with tape as directed. Compression Wrap Compression Stockings Add-Ons Wound #6 (Foot) Wound Laterality: Left, Medial, Distal Cleanser Soap and Water Discharge Instruction: May shower and wash wound with dial antibacterial soap and water prior to dressing change. Wound Cleanser Discharge Instruction: Cleanse the wound with wound cleanser prior to applying Robinson clean dressing using gauze sponges, not tissue or cotton balls. Peri-Wound Care Topical Primary Dressing Dakin's Solution 0.125%, 16 (oz) Discharge Instruction: Moisten gauze with Dakin's solution and lay on top of wound bed Secondary Dressing ABD Pad, 8x10 Discharge Instruction: Apply over primary dressing as directed. NonWoven Sponge, 4x4 (in/in) Discharge Instruction: Apply over primary dressing as directed Secured With Elastic Bandage 4 inch (ACE bandage) Discharge Instruction: Secure with ACE bandage as directed. Kerlix Roll Sterile, 4.5x3.1 (in/yd) Discharge Instruction: Secure with Kerlix as directed. Transpore Surgical  Tape, 2x10 (in/yd) Discharge Instruction: Secure dressing with tape as directed. Compression Wrap Compression Stockings Add-Ons Wound #7 (Ankle) Wound Laterality: Right, Lateral Cleanser Soap and Water Discharge Instruction: May shower and wash wound with dial antibacterial soap and water prior to dressing change. Wound Cleanser Discharge Instruction: Cleanse the wound with wound cleanser prior to applying Robinson clean dressing using gauze sponges, not tissue or cotton balls. Peri-Wound Care Topical Primary Dressing Dakin's Solution 0.125%, 16 (oz) Discharge Instruction: Moisten gauze with Dakin's solution and lay on top of wound bed Secondary Dressing ABD Pad, 8x10 Discharge Instruction: Apply over primary dressing as directed. NonWoven Sponge, 4x4 (in/in) Discharge Instruction: Apply over primary dressing as directed Secured With Elastic Bandage 4 inch (ACE bandage) Discharge Instruction: Secure with ACE bandage as directed. Kerlix Roll Sterile, 4.5x3.1 (in/yd) Discharge Instruction: Secure with Kerlix as directed. Transpore Surgical Tape, 2x10 (in/yd) Discharge Instruction: Secure dressing with tape as directed. Compression Wrap Compression Stockings Add-Ons Electronic Signature(s) Signed: 05/17/2021 4:35:28 PM By: Lorrin Jackson Signed: 05/17/2021 4:36:42 PM By: Kalman Shan DO Entered By: Kalman Shan on 05/17/2021 16:22:41 -------------------------------------------------------------------------------- Multi-Disciplinary Care Plan Details Patient Name: Date of Service: Candace Robinson, [name] 05/17/2021 2:00 PM Medical Record Number: [id number] Patient Account Number: [id number] Date of Birth/Sex: Treating RN: 07-04-1966 (55 y.o. [name] Primary Care [place]: Candace Robinson Other Clinician: Referring Kron Everton: Treating Alverna Fawley/Extender: Candace Robinson in Treatment: 12 Active Inactive Wound/Skin Impairment Nursing  Diagnoses: Impaired tissue integrity Goals: Patient/caregiver  will verbalize understanding of skin care regimen Date Initiated: 02/22/2021 Target Resolution Date: 05/28/2021 Goal Status: Active Interventions: Assess patient/caregiver ability to obtain necessary supplies Assess patient/caregiver ability to perform ulcer/skin care regimen upon admission and as needed Assess ulceration(s) every visit Provide education on ulcer and skin care Treatment Activities: Topical wound management initiated : 02/22/2021 Notes: 03/25/21: All wounds not yet at 30%, culture positive. Using gent cream. Electronic Signature(s) Signed: 05/17/2021 4:40:13 PM By: Baruch Gouty RN, BSN Entered By: Baruch Gouty on 05/17/2021 14:38:17 -------------------------------------------------------------------------------- Pain Assessment Details Patient Name: Date of Service: Candace Robinson, [name] 05/17/2021 2:00 PM Medical Record Number: [id number] Patient Account Number: [id number] Date of Birth/Sex: Treating RN: 11-14-1965 (55 y.o. [name] Primary Care Nasier Thumm: Candace Robinson Other Clinician: Referring Anzal Bartnick: Treating Maikayla Beggs/Extender: Candace Robinson in Treatment: 12 Active Problems Location of Pain Severity and Description of Pain Patient Has Paino Yes Site Locations Pain Location: Pain in Ulcers With Dressing Change: Yes Duration of the Pain. Constant / Intermittento Constant Rate the pain. Current Pain Level: 5 Worst Pain Level: 8 Least Pain Level: 2 Character of Pain Describe the Pain: Aching, Throbbing Pain Management and Medication Current Pain Management: Medication: Yes Is the Current Pain Management Adequate: Adequate Rest: Yes How does your wound impact your activities of daily livingo Sleep: Yes Bathing: No Appetite: No Relationship With Others: No Bladder Continence: No Emotions: Yes Bowel Continence: No Work: Yes Toileting:  No Drive: Yes Dressing: No Hobbies: No Engineer, maintenance) Signed: 05/17/2021 4:40:13 PM By: Baruch Gouty RN, BSN Entered By: Baruch Gouty on 05/17/2021 14:05:52 -------------------------------------------------------------------------------- Patient/Caregiver Education Details Patient Name: Date of Service: Candace Robinson Candace Robinson, [name] 11/14/2022andnbsp2:00 PM Medical Record Number: [id number] Patient Account Number: [id number] Date of Birth/Gender: Treating RN: Oct 25, 1965 (55 y.o. [name] Primary Care Physician: Candace Robinson Other Clinician: Referring Physician: Treating Physician/Extender: Candace Robinson in Treatment: 12 Education Assessment Education Provided To: Patient Education Topics Provided Pain: Methods: Explain/Verbal Responses: Reinforcements needed, State content correctly Wound/Skin Impairment: Methods: Explain/Verbal Responses: Reinforcements needed, State content correctly Electronic Signature(s) Signed: 05/17/2021 4:40:13 PM By: Baruch Gouty RN, BSN Entered By: Baruch Gouty on 05/17/2021 14:39:05 -------------------------------------------------------------------------------- Wound Assessment Details Patient Name: Date of Service: Candace Robinson, [name] 05/17/2021 2:00 PM Medical Record Number: [id number] Patient Account Number: [id number] Date of Birth/Sex: Treating RN: 1966-06-19 (55 y.o. Candace Robinson, Candace Robinson Primary Care Tami Barren: Candace Robinson Other Clinician: Referring Layne Lebon: Treating Mala Gibbard/Extender: [name] Weeks in Treatment: 12 Wound Status Wound Number: 1 Primary Diabetic Wound/Ulcer of the Lower Extremity Etiology: Wound Location: Right, Medial Ankle Wound Open Wounding Event: Gradually Appeared Status: Date Acquired: 04/03/2020 Comorbid Anemia, Arrhythmia, Congestive Heart Failure, Hypertension, Weeks Of Treatment: 12 History: Type II Diabetes,  Lupus Erythematosus Clustered Wound: No Photos Wound Measurements Length: (cm) 5.9 Width: (cm) 5.3 Depth: (cm) 0.3 Area: (cm) 24.559 Volume: (cm) 7.368 % Reduction in Area: -48.9% % Reduction in Volume: -123.3% Epithelialization: Small (1-33%) Tunneling: No Undermining: No Wound Description Classification: Grade 2 Wound Margin: Distinct, outline attached Exudate Amount: Medium Exudate Type: Serosanguineous Exudate Color: red, brown Foul Odor After Cleansing: No Slough/Fibrino Yes Wound Bed Granulation Amount: None Present (0%) Exposed Structure Necrotic Amount: Large (67-100%) Fascia Exposed: No Necrotic Quality: Adherent Slough Fat Layer (Subcutaneous Tissue) Exposed: Yes Tendon Exposed: No Muscle Exposed: No Joint Exposed: No Bone Exposed: No Treatment Notes Wound #1 (Ankle) Wound Laterality: Right, Medial Cleanser Soap and Water  Discharge Instruction: May shower and wash wound with dial antibacterial soap and water prior to dressing change. Wound Cleanser Discharge Instruction: Cleanse the wound with wound cleanser prior to applying Robinson clean dressing using gauze sponges, not tissue or cotton balls. Peri-Wound Care Topical Primary Dressing Dakin's Solution 0.125%, 16 (oz) Discharge Instruction: Moisten gauze with Dakin's solution and lay on top of wound bed Secondary Dressing ABD Pad, 8x10 Discharge Instruction: Apply over primary dressing as directed. NonWoven Sponge, 4x4 (in/in) Discharge Instruction: Apply over primary dressing as directed Secured With Elastic Bandage 4 inch (ACE bandage) Discharge Instruction: Secure with ACE bandage as directed. Kerlix Roll Sterile, 4.5x3.1 (in/yd) Discharge Instruction: Secure with Kerlix as directed. Transpore Surgical Tape, 2x10 (in/yd) Discharge Instruction: Secure dressing with tape as directed. Compression Wrap Compression Stockings Add-Ons Electronic Signature(s) Signed: 05/17/2021 4:40:13 PM By: Baruch Gouty RN, BSN Entered By: Baruch Gouty on 05/17/2021 14:15:54 -------------------------------------------------------------------------------- Wound Assessment Details Patient Name: Date of Service: Candace Robinson, [name] 05/17/2021 2:00 PM Medical Record Number: [id number] Patient Account Number: [id number] Date of Birth/Sex: Treating RN: [birth year] (55 y.o. Candace Robinson, Candace Robinson Primary Care Keante Urizar: Candace Robinson Other Clinician: Referring Jannie Doyle: Treating Nea Gittens/Extender: [name] Weeks in Treatment: 12 Wound Status Wound Number: 2 Primary Diabetic Wound/Ulcer of the Lower Extremity Etiology: Wound Location: Right, Dorsal Foot Wound Open Wounding Event: Gradually Appeared Status: Date Acquired: 04/03/2020 Comorbid Anemia, Arrhythmia, Congestive Heart Failure, Hypertension, Weeks Of Treatment: 12 History: Type II Diabetes, Lupus Erythematosus Clustered Wound: No Photos Wound Measurements Length: (cm) 4.9 Width: (cm) 5.3 Depth: (cm) 0.1 Area: (cm) 20.397 Volume: (cm) 2.04 % Reduction in Area: -73.1% % Reduction in Volume: -73.2% Epithelialization: Small (1-33%) Tunneling: No Undermining: No Wound Description Classification: Grade 2 Wound Margin: Distinct, outline attached Exudate Amount: Medium Exudate Type: Serosanguineous Exudate Color: red, brown Foul Odor After Cleansing: No Slough/Fibrino Yes Wound Bed Granulation Amount: Small (1-33%) Exposed Structure Granulation Quality: Red Fascia Exposed: No Necrotic Amount: Large (67-100%) Fat Layer (Subcutaneous Tissue) Exposed: Yes Necrotic Quality: Adherent Slough Tendon Exposed: No Muscle Exposed: No Joint Exposed: No Bone Exposed: No Treatment Notes Wound #2 (Foot) Wound Laterality: Dorsal, Right Cleanser Soap and Water Discharge Instruction: May shower and wash wound with dial antibacterial soap and water prior to dressing change. Wound Cleanser Discharge  Instruction: Cleanse the wound with wound cleanser prior to applying Robinson clean dressing using gauze sponges, not tissue or cotton balls. Peri-Wound Care Topical Primary Dressing Dakin's Solution 0.125%, 16 (oz) Discharge Instruction: Moisten gauze with Dakin's solution and lay on top of wound bed Secondary Dressing ABD Pad, 8x10 Discharge Instruction: Apply over primary dressing as directed. NonWoven Sponge, 4x4 (in/in) Discharge Instruction: Apply over primary dressing as directed Secured With Elastic Bandage 4 inch (ACE bandage) Discharge Instruction: Secure with ACE bandage as directed. Kerlix Roll Sterile, 4.5x3.1 (in/yd) Discharge Instruction: Secure with Kerlix as directed. Transpore Surgical Tape, 2x10 (in/yd) Discharge Instruction: Secure dressing with tape as directed. Compression Wrap Compression Stockings Add-Ons Electronic Signature(s) Signed: 05/17/2021 4:40:13 PM By: Baruch Gouty RN, BSN Entered By: Baruch Gouty on 05/17/2021 14:16:41 -------------------------------------------------------------------------------- Wound Assessment Details Patient Name: Date of Service: Candace Robinson, [name] 05/17/2021 2:00 PM Medical Record Number: [id number] Patient Account Number: [id number] Date of Birth/Sex: Treating RN: 20-Nov-1965 (55 y.o. [name] Primary Care Baylea Milburn: Candace Robinson Other Clinician: Referring Tulsi Crossett: Treating Dondi Burandt/Extender: [name] Weeks in Treatment: 12 Wound Status Wound Number: 3 Primary Diabetic Wound/Ulcer of the Lower  Extremity Etiology: Wound Location: Left, Medial Ankle Wound Open Wounding Event: Gradually Appeared Status: Date Acquired: 02/22/2021 Comorbid Anemia, Arrhythmia, Congestive Heart Failure, Hypertension, Weeks Of Treatment: 12 History: Type II Diabetes, Lupus Erythematosus Clustered Wound: No Photos Wound Measurements Length: (cm) 1 Width: (cm) 2.8 Depth: (cm) 0.3 Area:  (cm) 2.199 Volume: (cm) 0.66 % Reduction in Area: -12% % Reduction in Volume: -67.9% Epithelialization: Small (1-33%) Tunneling: No Undermining: No Wound Description Classification: Grade 2 Wound Margin: Distinct, outline attached Exudate Amount: Medium Exudate Type: Serosanguineous Exudate Color: red, brown Foul Odor After Cleansing: No Slough/Fibrino Yes Wound Bed Granulation Amount: None Present (0%) Exposed Structure Necrotic Amount: Large (67-100%) Fascia Exposed: No Fat Layer (Subcutaneous Tissue) Exposed: Yes Tendon Exposed: No Muscle Exposed: No Joint Exposed: No Bone Exposed: No Treatment Notes Wound #3 (Ankle) Wound Laterality: Left, Medial Cleanser Soap and Water Discharge Instruction: May shower and wash wound with dial antibacterial soap and water prior to dressing change. Wound Cleanser Discharge Instruction: Cleanse the wound with wound cleanser prior to applying Robinson clean dressing using gauze sponges, not tissue or cotton balls. Peri-Wound Care Topical Primary Dressing Dakin's Solution 0.125%, 16 (oz) Discharge Instruction: Moisten gauze with Dakin's solution and lay on top of wound bed Secondary Dressing ABD Pad, 8x10 Discharge Instruction: Apply over primary dressing as directed. NonWoven Sponge, 4x4 (in/in) Discharge Instruction: Apply over primary dressing as directed Secured With Elastic Bandage 4 inch (ACE bandage) Discharge Instruction: Secure with ACE bandage as directed. Kerlix Roll Sterile, 4.5x3.1 (in/yd) Discharge Instruction: Secure with Kerlix as directed. Transpore Surgical Tape, 2x10 (in/yd) Discharge Instruction: Secure dressing with tape as directed. Compression Wrap Compression Stockings Add-Ons Electronic Signature(s) Signed: 05/17/2021 4:40:13 PM By: Baruch Gouty RN, BSN Entered By: Baruch Gouty on 05/17/2021 14:48:31 -------------------------------------------------------------------------------- Wound Assessment  Details Patient Name: Date of Service: Candace Robinson, [name] 05/17/2021 2:00 PM Medical Record Number: [id number] Patient Account Number: [id number] Date of Birth/Sex: Treating RN: [birth year] (55 y.o. Candace Robinson, Candace Robinson Primary Care Etoy Mcdonnell: Candace Robinson Other Clinician: Referring Jacorion Klem: Treating Ivyanna Sibert/Extender: [name] Weeks in Treatment: 12 Wound Status Wound Number: 4 Primary Diabetic Wound/Ulcer of the Lower Extremity Etiology: Wound Location: Left, Lateral Ankle Wound Open Wounding Event: Gradually Appeared Status: Date Acquired: 04/03/2020 Comorbid Anemia, Arrhythmia, Congestive Heart Failure, Hypertension, Weeks Of Treatment: 12 History: Type II Diabetes, Lupus Erythematosus Clustered Wound: No Photos Wound Measurements Length: (cm) 8.1 Width: (cm) 5.4 Depth: (cm) 0.4 Area: (cm) 34.353 Volume: (cm) 13.741 % Reduction in Area: 8.9% % Reduction in Volume: -82.2% Epithelialization: None Tunneling: No Undermining: No Wound Description Classification: Grade 2 Wound Margin: Distinct, outline attached Exudate Amount: Medium Exudate Type: Serosanguineous Exudate Color: red, brown Foul Odor After Cleansing: No Slough/Fibrino Yes Wound Bed Granulation Amount: None Present (0%) Exposed Structure Necrotic Amount: Large (67-100%) Fascia Exposed: No Necrotic Quality: Adherent Slough Fat Layer (Subcutaneous Tissue) Exposed: Yes Tendon Exposed: No Muscle Exposed: No Joint Exposed: No Bone Exposed: No Treatment Notes Wound #4 (Ankle) Wound Laterality: Left, Lateral Cleanser Soap and Water Discharge Instruction: May shower and wash wound with dial antibacterial soap and water prior to dressing change. Wound Cleanser Discharge Instruction: Cleanse the wound with wound cleanser prior to applying Robinson clean dressing using gauze sponges, not tissue or cotton balls. Peri-Wound Care Topical Primary Dressing Dakin's Solution 0.125%,  16 (oz) Discharge Instruction: Moisten gauze with Dakin's solution and lay on top of wound bed Secondary Dressing ABD Pad, 8x10 Discharge Instruction: Apply over primary dressing as directed. NonWoven  Sponge, 4x4 (in/in) Discharge Instruction: Apply over primary dressing as directed Secured With Elastic Bandage 4 inch (ACE bandage) Discharge Instruction: Secure with ACE bandage as directed. Kerlix Roll Sterile, 4.5x3.1 (in/yd) Discharge Instruction: Secure with Kerlix as directed. Transpore Surgical Tape, 2x10 (in/yd) Discharge Instruction: Secure dressing with tape as directed. Compression Wrap Compression Stockings Add-Ons Electronic Signature(s) Signed: 05/17/2021 4:40:13 PM By: Baruch Gouty RN, BSN Entered By: Baruch Gouty on 05/17/2021 14:27:41 -------------------------------------------------------------------------------- Wound Assessment Details Patient Name: Date of Service: Candace Robinson, [name] 05/17/2021 2:00 PM Medical Record Number: [id number] Patient Account Number: [id number] Date of Birth/Sex: Treating RN: [birth year] (55 y.o. Candace Robinson, Candace Robinson Primary Care Lashaya Kienitz: Candace Robinson Other Clinician: Referring Ludger Bones: Treating Veryl Winemiller/Extender: [name] Weeks in Treatment: 12 Wound Status Wound Number: 5 Primary Diabetic Wound/Ulcer of the Lower Extremity Etiology: Wound Location: Left, Dorsal Foot Wound Open Wounding Event: Gradually Appeared Status: Date Acquired: 04/03/2020 Comorbid Anemia, Arrhythmia, Congestive Heart Failure, Hypertension, Weeks Of Treatment: 12 History: Type II Diabetes, Lupus Erythematosus Clustered Wound: No Photos Wound Measurements Length: (cm) 10 Width: (cm) 8.1 Depth: (cm) 0.3 Area: (cm) 63.617 Volume: (cm) 19.085 % Reduction in Area: -5.9% % Reduction in Volume: -217.7% Epithelialization: Small (1-33%) Tunneling: No Undermining: No Wound Description Classification: Grade  2 Wound Margin: Distinct, outline attached Exudate Amount: Medium Exudate Type: Serosanguineous Exudate Color: red, brown Wound Bed Granulation Amount: Small (1-33%) Granulation Quality: Red Necrotic Amount: Large (67-100%) Necrotic Quality: Eschar, Adherent Slough Foul Odor After Cleansing: No Slough/Fibrino Yes Exposed Structure Fascia Exposed: No Fat Layer (Subcutaneous Tissue) Exposed: Yes Tendon Exposed: No Muscle Exposed: No Joint Exposed: No Bone Exposed: No Treatment Notes Wound #5 (Foot) Wound Laterality: Dorsal, Left Cleanser Soap and Water Discharge Instruction: May shower and wash wound with dial antibacterial soap and water prior to dressing change. Wound Cleanser Discharge Instruction: Cleanse the wound with wound cleanser prior to applying Robinson clean dressing using gauze sponges, not tissue or cotton balls. Peri-Wound Care Topical Primary Dressing Dakin's Solution 0.125%, 16 (oz) Discharge Instruction: Moisten gauze with Dakin's solution and lay on top of wound bed Secondary Dressing ABD Pad, 8x10 Discharge Instruction: Apply over primary dressing as directed. NonWoven Sponge, 4x4 (in/in) Discharge Instruction: Apply over primary dressing as directed Secured With Elastic Bandage 4 inch (ACE bandage) Discharge Instruction: Secure with ACE bandage as directed. Kerlix Roll Sterile, 4.5x3.1 (in/yd) Discharge Instruction: Secure with Kerlix as directed. Transpore Surgical Tape, 2x10 (in/yd) Discharge Instruction: Secure dressing with tape as directed. Compression Wrap Compression Stockings Add-Ons Electronic Signature(s) Signed: 05/17/2021 4:40:13 PM By: Baruch Gouty RN, BSN Entered By: Baruch Gouty on 05/17/2021 14:29:01 -------------------------------------------------------------------------------- Wound Assessment Details Patient Name: Date of Service: Candace Robinson, [name] 05/17/2021 2:00 PM Medical Record Number: [id number] Patient Account  Number: [id number] Date of Birth/Sex: Treating RN: [birth year] (55 y.o. [name] Primary Care Khambrel Amsden: Candace Robinson Other Clinician: Referring Emunah Texidor: Treating Dannica Bickham/Extender: [name] Weeks in Treatment: 12 Wound Status Wound Number: 6 Primary Diabetic Wound/Ulcer of the Lower Extremity Etiology: Wound Location: Left, Distal, Medial Foot Wound Open Wounding Event: Gradually Appeared Status: Date Acquired: 04/03/2020 Comorbid Anemia, Arrhythmia, Congestive Heart Failure, Hypertension, Weeks Of Treatment: 12 Weeks Of Treatment: 12 History: Type II Diabetes, Lupus Erythematosus Clustered Wound: No Photos Wound Measurements Length: (cm) 1.4 Width: (cm) 1 Depth: (cm) 0.1 Area: (cm) 1.1 Volume: (cm) 0.11 % Reduction in Area: -250.3% % Reduction in Volume: -254.8% Epithelialization: Medium (34-66%) Tunneling: No Undermining: No Wound  Description Classification: Grade 2 Wound Margin: Distinct, outline attached Exudate Amount: Small Exudate Type: Serosanguineous Exudate Color: red, brown Foul Odor After Cleansing: No Slough/Fibrino Yes Wound Bed Granulation Amount: Small (1-33%) Exposed Structure Granulation Quality: Red, Pink Fascia Exposed: No Necrotic Amount: Large (67-100%) Fat Layer (Subcutaneous Tissue) Exposed: Yes Necrotic Quality: Adherent Slough Tendon Exposed: No Muscle Exposed: No Joint Exposed: No Bone Exposed: No Treatment Notes Wound #6 (Foot) Wound Laterality: Left, Medial, Distal Cleanser Soap and Water Discharge Instruction: May shower and wash wound with dial antibacterial soap and water prior to dressing change. Wound Cleanser Discharge Instruction: Cleanse the wound with wound cleanser prior to applying Robinson clean dressing using gauze sponges, not tissue or cotton balls. Peri-Wound Care Topical Primary Dressing Dakin's Solution 0.125%, 16 (oz) Discharge Instruction: Moisten gauze with Dakin's  solution and lay on top of wound bed Secondary Dressing ABD Pad, 8x10 Discharge Instruction: Apply over primary dressing as directed. NonWoven Sponge, 4x4 (in/in) Discharge Instruction: Apply over primary dressing as directed Secured With Elastic Bandage 4 inch (ACE bandage) Discharge Instruction: Secure with ACE bandage as directed. Kerlix Roll Sterile, 4.5x3.1 (in/yd) Discharge Instruction: Secure with Kerlix as directed. Transpore Surgical Tape, 2x10 (in/yd) Discharge Instruction: Secure dressing with tape as directed. Compression Wrap Compression Stockings Add-Ons Electronic Signature(s) Signed: 05/17/2021 4:40:13 PM By: Baruch Gouty RN, BSN Entered By: Baruch Gouty on 05/17/2021 14:30:45 -------------------------------------------------------------------------------- Wound Assessment Details Patient Name: Date of Service: Candace Robinson, [name] 05/17/2021 2:00 PM Medical Record Number: [id number] Patient Account Number: [id number] Date of Birth/Sex: Treating RN: [birth year] (55 y.o. Candace Robinson, Candace Robinson Primary Care Ossie Yebra: Candace Robinson Other Clinician: Referring Elchonon Maxson: Treating Sharnell Knight/Extender: [name] Weeks in Treatment: 12 Wound Status Wound Number: 7 Primary Lupus Etiology: Wound Location: Right, Lateral Ankle Wound Open Wounding Event: Gradually Appeared Status: Date Acquired: 04/08/2021 Comorbid Anemia, Arrhythmia, Congestive Heart Failure, Hypertension, Weeks Of Treatment: 5 History: Type II Diabetes, Lupus Erythematosus Clustered Wound: No Photos Wound Measurements Length: (cm) 1.5 Width: (cm) 1.3 Depth: (cm) 0.2 Area: (cm) 1.532 Volume: (cm) 0.306 % Reduction in Area: -4841.9% % Reduction in Volume: -10100% Epithelialization: Small (1-33%) Tunneling: No Undermining: No Wound Description Classification: Full Thickness Without Exposed Support Structures Wound Margin: Distinct, outline attached Exudate  Amount: Medium Exudate Type: Serosanguineous Exudate Color: red, brown Foul Odor After Cleansing: No Slough/Fibrino Yes Wound Bed Granulation Amount: Small (1-33%) Exposed Structure Granulation Quality: Pink Fascia Exposed: No Necrotic Amount: Large (67-100%) Fat Layer (Subcutaneous Tissue) Exposed: Yes Necrotic Quality: Adherent Slough Tendon Exposed: No Muscle Exposed: No Joint Exposed: No Bone Exposed: No Treatment Notes Wound #7 (Ankle) Wound Laterality: Right, Lateral Cleanser Soap and Water Discharge Instruction: May shower and wash wound with dial antibacterial soap and water prior to dressing change. Wound Cleanser Discharge Instruction: Cleanse the wound with wound cleanser prior to applying Robinson clean dressing using gauze sponges, not tissue or cotton balls. Peri-Wound Care Topical Primary Dressing Dakin's Solution 0.125%, 16 (oz) Discharge Instruction: Moisten gauze with Dakin's solution and lay on top of wound bed Secondary Dressing ABD Pad, 8x10 Discharge Instruction: Apply over primary dressing as directed. NonWoven Sponge, 4x4 (in/in) Discharge Instruction: Apply over primary dressing as directed Secured With Elastic Bandage 4 inch (ACE bandage) Discharge Instruction: Secure with ACE bandage as directed. Kerlix Roll Sterile, 4.5x3.1 (in/yd) Discharge Instruction: Secure with Kerlix as directed. Transpore Surgical Tape, 2x10 (in/yd) Discharge Instruction: Secure dressing with tape as directed. Compression Wrap Compression Stockings Add-Ons Electronic Signature(s) Signed: 05/17/2021 4:40:13 PM By:  Baruch Gouty RN, BSN Entered By: Baruch Gouty on 05/17/2021 14:31:47 -------------------------------------------------------------------------------- Vitals Details Patient Name: Date of Service: Candace Robinson, [name] 05/17/2021 2:00 PM Medical Record Number: [id number] Patient Account Number: [id number] Date of Birth/Sex: Treating RN: May 23, 1966 (55  y.o. [name] Primary Care Ajla Mcgeachy: Candace Robinson Other Clinician: Referring Betti Goodenow: Treating Khira Cudmore/Extender: Candace Robinson in Treatment: 12 Vital Signs Time Taken: 14:02 Temperature (F): 97.9 Height (in): 63 Pulse (bpm): 108 Source: Stated Respiratory Rate (breaths/min): 18 Weight (lbs): 157 Blood Pressure (mmHg): 107/70 Source: Stated Reference Range: 80 - 120 mg / dl Body Mass Index (BMI): 27.8 Electronic Signature(s) Signed: 05/17/2021 4:40:13 PM By: Baruch Gouty RN, BSN Entered By: Baruch Gouty on 05/17/2021 14:03:09

## 2021-05-24 ENCOUNTER — Encounter (HOSPITAL_BASED_OUTPATIENT_CLINIC_OR_DEPARTMENT_OTHER): Payer: Medicaid Other | Admitting: Internal Medicine

## 2021-05-31 ENCOUNTER — Other Ambulatory Visit: Payer: Self-pay

## 2021-05-31 ENCOUNTER — Encounter (HOSPITAL_BASED_OUTPATIENT_CLINIC_OR_DEPARTMENT_OTHER): Payer: Medicaid Other | Admitting: Internal Medicine

## 2021-05-31 DIAGNOSIS — L97522 Non-pressure chronic ulcer of other part of left foot with fat layer exposed: Secondary | ICD-10-CM

## 2021-05-31 DIAGNOSIS — L97512 Non-pressure chronic ulcer of other part of right foot with fat layer exposed: Secondary | ICD-10-CM | POA: Diagnosis not present

## 2021-05-31 NOTE — Progress Notes (Signed)
RAVIN, BENDALL ([id number]) Visit Report for 05/31/2021 Chief Complaint Document Details Patient Name: Date of Service: Candace A DNA X, [name] 05/31/2021 1:30 PM Medical Record Number: [id number] Patient Account Number: [id number] Date of Birth/Sex: Treating RN: [birth year] (55 y.o. Candace Robinson Primary Care Provider: [name] Other Clinician: Referring Provider: Treating Provider/Extender: Darcey Nora in Treatment: 14 Information Obtained from: Patient Chief Complaint Bilateral feet wounds Electronic Signature(s) Signed: 05/31/2021 4:15:49 PM By: Kalman Shan DO Entered By: Kalman Shan on 05/31/2021 15:16:21 -------------------------------------------------------------------------------- HPI Details Patient Name: Date of Service: Candace Robinson, [name] 05/31/2021 1:30 PM Medical Record Number: [id number] Patient Account Number: [id number] Date of Birth/Sex: Treating RN: 03-Jul-1966 (55 y.o. Candace Robinson Primary Care Provider: [name] Other Clinician: Referring Provider: Treating Provider/Extender: [name] Weeks in Treatment: 14 History of Present Illness HPI Description: Admission 8/22 Candace Robinson is a 55 year old female with a past medical history of diet-controlled type 2 diabetes, hypothyroidism, chronic diastolic heart failure, cutaneous lupus erythematosus and essential hypertension that presents to the clinic for a 51-month history of worsening bilateral feet ulcers. She states that wounds to her feet have been an ongoing issue for several years. She states that with wound care they improve however they tend to wax and wane. 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 She was discharged to a nursing facility and she was provided wound care for her bilateral feet ulcers at that time. She states they used Silvadene and silver alginate. She states  that when she was discharged in April from the nursing facility her wounds had improved but were not fully closed. She currently uses Silvadene but has not been using silver alginate for several months due to running out. She reports significant decline in her wound healing over the last 4 months. She reports pain to the wound sites. She currently denies increased warmth erythema or purulent drainage. 8/29; patient presents for 1 week follow-up. She states she had ABIs completed without TBI's. She continues to use silver alginate with dressing changes. She reports continued pain. She denies signs of infection. 9/8; patient presents for 1 week follow-up. She was evaluated by Dr. Donzetta Matters and she reports follow-up with him in 1 month. She has been using Santyl to the wound beds. She reports some improvement in wound healing. She denies signs of infection. 9/15; patient presents for follow-up. She continues to use Santyl and silver alginate to the wound beds. She states the alginate is sticking and hard to remove with dressing changes. She currently denies signs of infection. 9/22; patient presents for 1 week follow-up. She is using Santyl and silver alginate to the wound beds. She ran out of [place] and has been using Silvadene. She states that gentamicin ointment is ready at her pharmacy and will be picking this up today. She has no issues or complaints today. She denies signs of infection. 9/30; she is using Santyl and silver alginate. She has substantial wounds on her bilateral feet and bilateral ankles that have been there for a year. She is in a lot of pain. She cannot bear to have these wounds touched let alone a mechanical debridement here. Furthermore the etiology of this is really not totally clear. She had reasonably normal ABIs with triphasic waveforms in August [I did not review this however]. She is completing a course of ciprofloxacin for an area on the left lateral ankle. She has had prior  ablations and follows with vascular surgery. Finally she apparently has lupus 10/6; patient has been using Silvadene to the wound beds with dressing changes. She states she is picking up Santyl today. She has no issues or complaints today. She denies signs of infection. 10/17 the patient managed to get Santyl which the she is using to all wound beds. There is a large areas of breakdown here including substantially on the left distal foot, right foot involving the dorsal toes both medial ankles. All of these wounds are covered with some degree of very adherent fibrinous material. The patient did has an appointment with vascular surgery Dr. Donzetta Matters in 2 days time 10/24; patient presents for follow-up. She has been using Santyl and silver alginate to the wound beds. She states that she is going to be set up with rheumatology soon. She denies signs of infection. She overall reports stability in her wound healing. 11/7; patient presents for follow-up. She continues to use Santyl and silver alginate to the wound beds. She has an appointment set to see rheumatology on 07/06/2021. She reports some improvement in her wound healing. She currently denies signs of infection. 11/14; patient presents for follow-up. She has been using Santyl and silver alginate to the wound beds. She has no issues or complaints today. She denies signs of infection. 11/28; patient presents for follow-up. She has been using silver alginate and Santyl to the wound beds. She did not pick up Dakin's solution. She felt a burning sensation to her feet when it was applied in office. She states she picked up ciprofloxacin 1 week ago. And she is currently taking this. She currently denies signs of infection. Electronic Signature(s) Signed: 05/31/2021 4:15:49 PM By: Kalman Shan DO Entered By: Kalman Shan on 05/31/2021 15:17:39 -------------------------------------------------------------------------------- Physical Exam  Details Patient Name: Date of Service: Candace A DNA X, [name] 05/31/2021 1:30 PM Medical Record Number: [id number] Patient Account Number: [id number] Date of Birth/Sex: Treating RN: July 19, 1965 (55 y.o. Candace Robinson Primary Care Provider: [name] Other Clinician: Referring Provider: Treating Provider/Extender: [name] Weeks in Treatment: 14 Constitutional respirations regular, non-labored and within target range for patient.. Cardiovascular 2+ dorsalis pedis/posterior tibialis pulses. Psychiatric pleasant and cooperative. Notes Wounds throughout the feet bilaterally with mostly nonviable tissue and scant granulation tissue present. No obvious signs of surrounding soft tissue infection. Electronic Signature(s) Signed: 05/31/2021 4:15:49 PM By: Kalman Shan DO Entered By: Kalman Shan on 05/31/2021 15:18:52 -------------------------------------------------------------------------------- Physician Orders Details Patient Name: Date of Service: Candace A DNA X, [name] 05/31/2021 1:30 PM Medical Record Number: [id number] Patient Account Number: [id number] Date of Birth/Sex: Treating RN: [birth year] (55 y.o. Candace Robinson, Meta.Reding Primary Care Provider: [name] Other Clinician: Referring Provider: Treating Provider/Extender: Darcey Nora in Treatment: [id number] Verbal / Phone Orders: No Diagnosis Coding ICD-10 Coding Code Description [phone] Non-pressure chronic ulcer of other part of right foot with fat layer exposed L97.522 Non-pressure chronic ulcer of other part of left foot with fat layer exposed I73.9 Peripheral vascular disease, unspecified E11.9 Type 2 diabetes mellitus without complications X21.1 Other local lupus erythematosus H41.74 Chronic diastolic (congestive) heart failure I10 Essential (primary) hypertension E11.621 Type 2 diabetes mellitus with foot ulcer Follow-up Appointments ppointment in  2 weeks. - with Dr. Heber [place] *****EXTRA TIME - 58 MINUTES***** Return A Other: - [facility]=Supplies Bathing/ Shower/ Hygiene May shower and wash wound with soap and water. - when dressings changed Edema Control - Lymphedema / SCD / Other Elevate  legs to the level of the heart or above for 30 minutes daily and/or when sitting, a frequency of: - throughout the day Avoid standing for long periods of time. Additional Orders / Instructions Follow Nutritious Diet - 100-120g of protein daily Wound Treatment Wound #1 - Ankle Wound Laterality: Right, Medial Cleanser: Soap and Water Other:2 times per day/30 Days Discharge Instructions: May shower and wash wound with dial antibacterial soap and water prior to dressing change. Cleanser: Wound Cleanser Other:2 times per day/30 Days Discharge Instructions: Cleanse the wound with wound cleanser prior to applying a clean dressing using gauze sponges, not tissue or cotton balls. Topical: Silvadene Cream Other:2 times per day/30 Days Discharge Instructions: Apply thin layer to wound bed only Secondary Dressing: ABD Pad, 8x10 (Generic) Other:2 times per day/30 Days Discharge Instructions: Apply over primary dressing as directed. Secondary Dressing: NonWoven Sponge, 4x4 (in/in) (Generic) Other:2 times per day/30 Days Discharge Instructions: Apply over primary dressing as directed Secured With: Elastic Bandage 4 inch (ACE bandage) Other:2 times per day/30 Days Discharge Instructions: Secure with ACE bandage as directed. Secured With: [employer], 4.5x3.1 (in/yd) Other:2 times per day/30 Days Discharge Instructions: Secure with Kerlix as directed. Secured With: Transpore Surgical Tape, 2x10 (in/yd) Other:2 times per day/30 Days Discharge Instructions: Secure dressing with tape as directed. Wound #2 - Foot Wound Laterality: Dorsal, Right Cleanser: Soap and Water Other:2 times per day/30 Days Discharge Instructions: May shower and wash wound with  dial antibacterial soap and water prior to dressing change. Cleanser: Wound Cleanser Other:2 times per day/30 Days Discharge Instructions: Cleanse the wound with wound cleanser prior to applying a clean dressing using gauze sponges, not tissue or cotton balls. Topical: Silvadene Cream Other:2 times per day/30 Days Discharge Instructions: Apply thin layer to wound bed only Secondary Dressing: ABD Pad, 8x10 (Generic) Other:2 times per day/30 Days Discharge Instructions: Apply over primary dressing as directed. Secondary Dressing: NonWoven Sponge, 4x4 (in/in) (Generic) Other:2 times per day/30 Days Discharge Instructions: Apply over primary dressing as directed Secured With: Elastic Bandage 4 inch (ACE bandage) Other:2 times per day/30 Days Discharge Instructions: Secure with ACE bandage as directed. Secured With: [employer], 4.5x3.1 (in/yd) Other:2 times per day/30 Days Discharge Instructions: Secure with Kerlix as directed. Secured With: Transpore Surgical Tape, 2x10 (in/yd) Other:2 times per day/30 Days Discharge Instructions: Secure dressing with tape as directed. Wound #3 - Ankle Wound Laterality: Left, Medial Cleanser: Soap and Water Other:2 times per day/30 Days Discharge Instructions: May shower and wash wound with dial antibacterial soap and water prior to dressing change. Cleanser: Wound Cleanser Other:2 times per day/30 Days Discharge Instructions: Cleanse the wound with wound cleanser prior to applying a clean dressing using gauze sponges, not tissue or cotton balls. Topical: Silvadene Cream Other:2 times per day/30 Days Discharge Instructions: Apply thin layer to wound bed only Secondary Dressing: ABD Pad, 8x10 (Generic) Other:2 times per day/30 Days Discharge Instructions: Apply over primary dressing as directed. Secondary Dressing: NonWoven Sponge, 4x4 (in/in) (Generic) Other:2 times per day/30 Days Discharge Instructions: Apply over primary dressing as directed Secured  With: Elastic Bandage 4 inch (ACE bandage) Other:2 times per day/30 Days Discharge Instructions: Secure with ACE bandage as directed. Secured With: [employer], 4.5x3.1 (in/yd) Other:2 times per day/30 Days Discharge Instructions: Secure with Kerlix as directed. Secured With: Transpore Surgical Tape, 2x10 (in/yd) Other:2 times per day/30 Days Discharge Instructions: Secure dressing with tape as directed. Wound #4 - Ankle Wound Laterality: Left, Lateral Cleanser: Soap and Water Other:2 times  per day/30 Days Discharge Instructions: May shower and wash wound with dial antibacterial soap and water prior to dressing change. Cleanser: Wound Cleanser Other:2 times per day/30 Days Discharge Instructions: Cleanse the wound with wound cleanser prior to applying a clean dressing using gauze sponges, not tissue or cotton balls. Topical: Silvadene Cream Other:2 times per day/30 Days Discharge Instructions: Apply thin layer to wound bed only Secondary Dressing: ABD Pad, 8x10 (Generic) Other:2 times per day/30 Days Discharge Instructions: Apply over primary dressing as directed. Secondary Dressing: NonWoven Sponge, 4x4 (in/in) (Generic) Other:2 times per day/30 Days Discharge Instructions: Apply over primary dressing as directed Secured With: Elastic Bandage 4 inch (ACE bandage) Other:2 times per day/30 Days Discharge Instructions: Secure with ACE bandage as directed. Secured With: [employer], 4.5x3.1 (in/yd) Other:2 times per day/30 Days Discharge Instructions: Secure with Kerlix as directed. Secured With: Transpore Surgical Tape, 2x10 (in/yd) Other:2 times per day/30 Days Discharge Instructions: Secure dressing with tape as directed. Wound #5 - Foot Wound Laterality: Dorsal, Left Cleanser: Soap and Water Other:2 times per day/30 Days Discharge Instructions: May shower and wash wound with dial antibacterial soap and water prior to dressing change. Cleanser: Wound Cleanser Other:2 times  per day/30 Days Discharge Instructions: Cleanse the wound with wound cleanser prior to applying a clean dressing using gauze sponges, not tissue or cotton balls. Topical: Silvadene Cream Other:2 times per day/30 Days Discharge Instructions: Apply thin layer to wound bed only Secondary Dressing: ABD Pad, 8x10 (Generic) Other:2 times per day/30 Days Discharge Instructions: Apply over primary dressing as directed. Secondary Dressing: NonWoven Sponge, 4x4 (in/in) (Generic) Other:2 times per day/30 Days Discharge Instructions: Apply over primary dressing as directed Secured With: Elastic Bandage 4 inch (ACE bandage) Other:2 times per day/30 Days Discharge Instructions: Secure with ACE bandage as directed. Secured With: [employer], 4.5x3.1 (in/yd) Other:2 times per day/30 Days Discharge Instructions: Secure with Kerlix as directed. Secured With: Transpore Surgical Tape, 2x10 (in/yd) Other:2 times per day/30 Days Discharge Instructions: Secure dressing with tape as directed. Wound #6 - Foot Wound Laterality: Left, Medial, Distal Cleanser: Soap and Water Other:2 times per day/30 Days Discharge Instructions: May shower and wash wound with dial antibacterial soap and water prior to dressing change. Cleanser: Wound Cleanser Other:2 times per day/30 Days Discharge Instructions: Cleanse the wound with wound cleanser prior to applying a clean dressing using gauze sponges, not tissue or cotton balls. Topical: Silvadene Cream Other:2 times per day/30 Days Discharge Instructions: Apply thin layer to wound bed only Secondary Dressing: ABD Pad, 8x10 (Generic) Other:2 times per day/30 Days Discharge Instructions: Apply over primary dressing as directed. Secondary Dressing: NonWoven Sponge, 4x4 (in/in) (Generic) Other:2 times per day/30 Days Discharge Instructions: Apply over primary dressing as directed Secured With: Elastic Bandage 4 inch (ACE bandage) Other:2 times per day/30 Days Discharge  Instructions: Secure with ACE bandage as directed. Secured With: [employer], 4.5x3.1 (in/yd) Other:2 times per day/30 Days Discharge Instructions: Secure with Kerlix as directed. Secured With: Transpore Surgical Tape, 2x10 (in/yd) Other:2 times per day/30 Days Discharge Instructions: Secure dressing with tape as directed. Wound #7 - Ankle Wound Laterality: Right, Lateral Cleanser: Soap and Water Other:2 times per day/30 Days Discharge Instructions: May shower and wash wound with dial antibacterial soap and water prior to dressing change. Cleanser: Wound Cleanser Other:2 times per day/30 Days Discharge Instructions: Cleanse the wound with wound cleanser prior to applying a clean dressing using gauze sponges, not tissue or cotton balls. Topical: Silvadene Cream Other:2 times per day/30  Days Discharge Instructions: Apply thin layer to wound bed only Secondary Dressing: ABD Pad, 8x10 (Generic) Other:2 times per day/30 Days Discharge Instructions: Apply over primary dressing as directed. Secondary Dressing: NonWoven Sponge, 4x4 (in/in) (Generic) Other:2 times per day/30 Days Discharge Instructions: Apply over primary dressing as directed Secured With: Elastic Bandage 4 inch (ACE bandage) Other:2 times per day/30 Days Discharge Instructions: Secure with ACE bandage as directed. Secured With: [employer], 4.5x3.1 (in/yd) Other:2 times per day/30 Days Discharge Instructions: Secure with Kerlix as directed. Secured With: Transpore Surgical Tape, 2x10 (in/yd) Other:2 times per day/30 Days Discharge Instructions: Secure dressing with tape as directed. Electronic Signature(s) Signed: 05/31/2021 4:15:49 PM By: Kalman Shan DO Entered By: Kalman Shan on 05/31/2021 15:20:23 -------------------------------------------------------------------------------- Problem List Details Patient Name: Date of Service: Candace A [name], [name] 05/31/2021 1:30 PM Medical Record Number:  [id number] Patient Account Number: [id number] Date of Birth/Sex: Treating RN: 1965-07-27 (55 y.o. Candace Robinson, Meta.Reding Primary Care Provider: [name] Other Clinician: Referring Provider: Treating Provider/Extender: Darcey Nora in Treatment: 14 Active Problems ICD-10 Encounter Code Description Active Date MDM Diagnosis L97.512 Non-pressure chronic ulcer of other part of right foot with fat layer exposed 02/22/2021 No Yes L97.522 Non-pressure chronic ulcer of other part of left foot with fat layer exposed 02/22/2021 No Yes I73.9 Peripheral vascular disease, unspecified 02/22/2021 No Yes E11.9 Type 2 diabetes mellitus without complications 2/59/5638 No Yes L93.2 Other local lupus erythematosus 02/22/2021 No Yes V56.43 Chronic diastolic (congestive) heart failure 02/22/2021 No Yes I10 Essential (primary) hypertension 02/22/2021 No Yes E11.621 Type 2 diabetes mellitus with foot ulcer 02/22/2021 No Yes Inactive Problems Resolved Problems Electronic Signature(s) Signed: 05/31/2021 4:15:49 PM By: Kalman Shan DO Entered By: Kalman Shan on 05/31/2021 15:14:49 -------------------------------------------------------------------------------- Progress Note Details Patient Name: Date of Service: Candace A [name], [name] 05/31/2021 1:30 PM Medical Record Number: [id number] Patient Account Number: [id number] Date of Birth/Sex: Treating RN: 01-02-1966 (55 y.o. Candace Robinson Primary Care Provider: [name] Other Clinician: Referring Provider: Treating Provider/Extender: Darcey Nora in Treatment: 14 Subjective Chief Complaint Information obtained from Patient Bilateral feet wounds History of Present Illness (HPI) Admission 8/22 Ms. Aashvi Rezabek is a 55 year old female with a past medical history of diet-controlled type 2 diabetes, hypothyroidism, chronic diastolic heart failure, cutaneous lupus erythematosus  and essential hypertension that presents to the clinic for a 44-month history of worsening bilateral feet ulcers. She states that wounds to her feet have been an ongoing issue for several years. She states that with wound care they improve however they tend to wax and wane. 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 She was discharged to a nursing facility and she was provided wound care for her bilateral feet ulcers at that time. She states they used Silvadene and silver alginate. She states that when she was discharged in April from the nursing facility her wounds had improved but were not fully closed. She currently uses Silvadene but has not been using silver alginate for several months due to running out. She reports significant decline in her wound healing over the last 4 months. She reports pain to the wound sites. She currently denies increased warmth erythema or purulent drainage. 8/29; patient presents for 1 week follow-up. She states she had ABIs completed without TBI's. She continues to use silver alginate with dressing changes. She reports continued pain. She denies signs of infection. 9/8; patient presents for 1 week follow-up.  She was evaluated by Dr. Donzetta Matters and she reports follow-up with him in 1 month. She has been using Santyl to the wound beds. She reports some improvement in wound healing. She denies signs of infection. 9/15; patient presents for follow-up. She continues to use Santyl and silver alginate to the wound beds. She states the alginate is sticking and hard to remove with dressing changes. She currently denies signs of infection. 9/22; patient presents for 1 week follow-up. She is using Santyl and silver alginate to the wound beds. She ran out of [place] and has been using Silvadene. She states that gentamicin ointment is ready at her pharmacy and will be picking this up today. She has no issues or complaints today. She denies signs  of infection. 9/30; she is using Santyl and silver alginate. She has substantial wounds on her bilateral feet and bilateral ankles that have been there for a year. She is in a lot of pain. She cannot bear to have these wounds touched let alone a mechanical debridement here. Furthermore the etiology of this is really not totally clear. She had reasonably normal ABIs with triphasic waveforms in August [I did not review this however]. She is completing a course of ciprofloxacin for an area on the left lateral ankle. She has had prior ablations and follows with vascular surgery. Finally she apparently has lupus 10/6; patient has been using Silvadene to the wound beds with dressing changes. She states she is picking up Santyl today. She has no issues or complaints today. She denies signs of infection. 10/17 the patient managed to get Santyl which the she is using to all wound beds. There is a large areas of breakdown here including substantially on the left distal foot, right foot involving the dorsal toes both medial ankles. All of these wounds are covered with some degree of very adherent fibrinous material. The patient did has an appointment with vascular surgery Dr. Donzetta Matters in 2 days time 10/24; patient presents for follow-up. She has been using Santyl and silver alginate to the wound beds. She states that she is going to be set up with rheumatology soon. She denies signs of infection. She overall reports stability in her wound healing. 11/7; patient presents for follow-up. She continues to use Santyl and silver alginate to the wound beds. She has an appointment set to see rheumatology on 07/06/2021. She reports some improvement in her wound healing. She currently denies signs of infection. 11/14; patient presents for follow-up. She has been using Santyl and silver alginate to the wound beds. She has no issues or complaints today. She denies signs of infection. 11/28; patient presents for follow-up. She  has been using silver alginate and Santyl to the wound beds. She did not pick up Dakin's solution. She felt a burning sensation to her feet when it was applied in office. She states she picked up ciprofloxacin 1 week ago. And she is currently taking this. She currently denies signs of infection. Patient History Information obtained from Patient. Family History Unknown History. Social History Never smoker, Marital Status - Single, Alcohol Use - Never, Drug Use - No History, Caffeine Use - Rarely. Medical History Hematologic/Lymphatic Patient has history of Anemia Cardiovascular Patient has history of Arrhythmia - A-Fib, Congestive Heart Failure, Hypertension Denies history of Angina Endocrine Patient has history of Type II Diabetes Immunological Patient has history of Lupus Erythematosus Medical A Surgical History Notes nd Endocrine Grave's disease Objective Constitutional respirations regular, non-labored and within target range for patient.. Vitals Time  Taken: 2:23 PM, Height: 63 in, Weight: 157 lbs, BMI: 27.8, Temperature: 98.5 F, Pulse: 99 bpm, Respiratory Rate: 18 breaths/min, Blood Pressure: 96/65 mmHg. Cardiovascular 2+ dorsalis pedis/posterior tibialis pulses. Psychiatric pleasant and cooperative. General Notes: Wounds throughout the feet bilaterally with mostly nonviable tissue and scant granulation tissue present. No obvious signs of surrounding soft tissue infection. Integumentary (Hair, Skin) Wound #1 status is Open. Original cause of wound was Gradually Appeared. The date acquired was: 04/03/2020. The wound has been in treatment 14 weeks. The wound is located on the Right,Medial Ankle. The wound measures 6.4cm length x 5.3cm width x 0.3cm depth; 26.641cm^2 area and 7.992cm^3 volume. There is Fat Layer (Subcutaneous Tissue) exposed. There is no tunneling or undermining noted. There is a medium amount of serosanguineous drainage noted. The wound margin is distinct  with the outline attached to the wound base. There is small (1-33%) granulation within the wound bed. There is a large (67-100%) amount of necrotic tissue within the wound bed including Eschar and Adherent Slough. Wound #2 status is Open. Original cause of wound was Gradually Appeared. The date acquired was: 04/03/2020. The wound has been in treatment 14 weeks. The wound is located on the Right,Dorsal Foot. The wound measures 7cm length x 5.5cm width x 0.1cm depth; 30.238cm^2 area and 3.024cm^3 volume. There is Fat Layer (Subcutaneous Tissue) exposed. There is no tunneling or undermining noted. There is a medium amount of serosanguineous drainage noted. The wound margin is distinct with the outline attached to the wound base. There is small (1-33%) red granulation within the wound bed. There is a large (67-100%) amount of necrotic tissue within the wound bed including Eschar and Adherent Slough. Wound #3 status is Open. Original cause of wound was Gradually Appeared. The date acquired was: 02/22/2021. The wound has been in treatment 14 weeks. The wound is located on the Left,Medial Ankle. The wound measures 1cm length x 2.8cm width x 0.3cm depth; 2.199cm^2 area and 0.66cm^3 volume. There is Fat Layer (Subcutaneous Tissue) exposed. There is no tunneling or undermining noted. There is a medium amount of serosanguineous drainage noted. The wound margin is distinct with the outline attached to the wound base. There is small (1-33%) red granulation within the wound bed. There is a large (67-100%) amount of necrotic tissue within the wound bed including Adherent Slough. Wound #4 status is Open. Original cause of wound was Gradually Appeared. The date acquired was: 04/03/2020. The wound has been in treatment 14 weeks. The wound is located on the Left,Lateral Ankle. The wound measures 8.3cm length x 6cm width x 0.4cm depth; 39.113cm^2 area and 15.645cm^3 volume. There is Fat Layer (Subcutaneous Tissue) exposed.  There is no tunneling or undermining noted. There is a medium amount of serosanguineous drainage noted. The wound margin is distinct with the outline attached to the wound base. There is small (1-33%) red granulation within the wound bed. There is a large (67-100%) amount of necrotic tissue within the wound bed including Eschar and Adherent Slough. Wound #5 status is Open. Original cause of wound was Gradually Appeared. The date acquired was: 04/03/2020. The wound has been in treatment 14 weeks. The wound is located on the Left,Dorsal Foot. The wound measures 9cm length x 9cm width x 0.3cm depth; 63.617cm^2 area and 19.085cm^3 volume. There is Fat Layer (Subcutaneous Tissue) exposed. There is no tunneling or undermining noted. There is a medium amount of serosanguineous drainage noted. The wound margin is distinct with the outline attached to the wound base. There is  small (1-33%) red granulation within the wound bed. There is a large (67-100%) amount of necrotic tissue within the wound bed including Eschar and Adherent Slough. Wound #6 status is Open. Original cause of wound was Gradually Appeared. The date acquired was: 04/03/2020. The wound has been in treatment 14 weeks. The wound is located on the Left,Distal,Medial Foot. The wound measures 1.2cm length x 0.8cm width x 0.1cm depth; 0.754cm^2 area and 0.075cm^3 volume. There is Fat Layer (Subcutaneous Tissue) exposed. There is no tunneling or undermining noted. There is a small amount of serosanguineous drainage noted. The wound margin is distinct with the outline attached to the wound base. There is medium (34-66%) red granulation within the wound bed. There is a medium (34-66%) amount of necrotic tissue within the wound bed including Adherent Slough. Wound #7 status is Open. Original cause of wound was Gradually Appeared. The date acquired was: 04/08/2021. The wound has been in treatment 7 weeks. The wound is located on the Right,Lateral Ankle. The  wound measures 1.5cm length x 1.4cm width x 0.2cm depth; 1.649cm^2 area and 0.33cm^3 volume. There is Fat Layer (Subcutaneous Tissue) exposed. There is no tunneling or undermining noted. There is a medium amount of serosanguineous drainage noted. The wound margin is distinct with the outline attached to the wound base. There is medium (34-66%) pink granulation within the wound bed. There is a medium (34- 66%) amount of necrotic tissue within the wound bed including Adherent Slough. Assessment Active Problems ICD-10 Non-pressure chronic ulcer of other part of right foot with fat layer exposed Non-pressure chronic ulcer of other part of left foot with fat layer exposed Peripheral vascular disease, unspecified Type 2 diabetes mellitus without complications Other local lupus erythematosus Chronic diastolic (congestive) heart failure Essential (primary) hypertension Type 2 diabetes mellitus with foot ulcer Patient's wounds have shown slight improvement in appearance since last clinic visit. Unfortunately patient was unable to use Dakin's solution wet-to-dry dressings. I recommended she finish her ciprofloxacin course. Due to patient discomfort I am unable to debride the wound beds. Silvadene seems to be the only thing that helps soften up the nonviable tissue. I recommended she use this daily for the next 2 weeks. If I am unable to debride this at next clinic visit I will refer her to plastic surgery for OR debridement And skin substitute placement. No obvious signs of infection on exam. Plan Follow-up Appointments: Return Appointment in 2 weeks. - with Dr. Heber Pinal *****EXTRA TIME - 50 MINUTES***** Other: - [facility]=Supplies Bathing/ Shower/ Hygiene: May shower and wash wound with soap and water. - when dressings changed Edema Control - Lymphedema / SCD / Other: Elevate legs to the level of the heart or above for 30 minutes daily and/or when sitting, a frequency of: - throughout the  day Avoid standing for long periods of time. Additional Orders / Instructions: Follow Nutritious Diet - 100-120g of protein daily WOUND #1: - Ankle Wound Laterality: Right, Medial Cleanser: Soap and Water Other:2 times per day/30 Days Discharge Instructions: May shower and wash wound with dial antibacterial soap and water prior to dressing change. Cleanser: Wound Cleanser Other:2 times per day/30 Days Discharge Instructions: Cleanse the wound with wound cleanser prior to applying a clean dressing using gauze sponges, not tissue or cotton balls. Topical: Silvadene Cream Other:2 times per day/30 Days Discharge Instructions: Apply thin layer to wound bed only Secondary Dressing: ABD Pad, 8x10 (Generic) Other:2 times per day/30 Days Discharge Instructions: Apply over primary dressing as directed. Secondary Dressing: NonWoven Sponge,  4x4 (in/in) (Generic) Other:2 times per day/30 Days Discharge Instructions: Apply over primary dressing as directed Secured With: Elastic Bandage 4 inch (ACE bandage) Other:2 times per day/30 Days Discharge Instructions: Secure with ACE bandage as directed. Secured With: [employer], 4.5x3.1 (in/yd) Other:2 times per day/30 Days Discharge Instructions: Secure with Kerlix as directed. Secured With: [employer] T ape, 2x10 (in/yd) Other:2 times per day/30 Days Discharge Instructions: Secure dressing with tape as directed. WOUND #2: - Foot Wound Laterality: Dorsal, Right Cleanser: Soap and Water Other:2 times per day/30 Days Discharge Instructions: May shower and wash wound with dial antibacterial soap and water prior to dressing change. Cleanser: Wound Cleanser Other:2 times per day/30 Days Discharge Instructions: Cleanse the wound with wound cleanser prior to applying a clean dressing using gauze sponges, not tissue or cotton balls. Topical: Silvadene Cream Other:2 times per day/30 Days Discharge Instructions: Apply thin layer to wound bed  only Secondary Dressing: ABD Pad, 8x10 (Generic) Other:2 times per day/30 Days Discharge Instructions: Apply over primary dressing as directed. Secondary Dressing: NonWoven Sponge, 4x4 (in/in) (Generic) Other:2 times per day/30 Days Discharge Instructions: Apply over primary dressing as directed Secured With: Elastic Bandage 4 inch (ACE bandage) Other:2 times per day/30 Days Discharge Instructions: Secure with ACE bandage as directed. Secured With: [employer], 4.5x3.1 (in/yd) Other:2 times per day/30 Days Discharge Instructions: Secure with Kerlix as directed. Secured With: [employer] T ape, 2x10 (in/yd) Other:2 times per day/30 Days Discharge Instructions: Secure dressing with tape as directed. WOUND #3: - Ankle Wound Laterality: Left, Medial Cleanser: Soap and Water Other:2 times per day/30 Days Discharge Instructions: May shower and wash wound with dial antibacterial soap and water prior to dressing change. Cleanser: Wound Cleanser Other:2 times per day/30 Days Discharge Instructions: Cleanse the wound with wound cleanser prior to applying a clean dressing using gauze sponges, not tissue or cotton balls. Topical: Silvadene Cream Other:2 times per day/30 Days Discharge Instructions: Apply thin layer to wound bed only Secondary Dressing: ABD Pad, 8x10 (Generic) Other:2 times per day/30 Days Discharge Instructions: Apply over primary dressing as directed. Secondary Dressing: NonWoven Sponge, 4x4 (in/in) (Generic) Other:2 times per day/30 Days Discharge Instructions: Apply over primary dressing as directed Secured With: Elastic Bandage 4 inch (ACE bandage) Other:2 times per day/30 Days Discharge Instructions: Secure with ACE bandage as directed. Secured With: [employer], 4.5x3.1 (in/yd) Other:2 times per day/30 Days Discharge Instructions: Secure with Kerlix as directed. Secured With: [employer] T ape, 2x10 (in/yd) Other:2 times per day/30 Days Discharge  Instructions: Secure dressing with tape as directed. WOUND #4: - Ankle Wound Laterality: Left, Lateral Cleanser: Soap and Water Other:2 times per day/30 Days Discharge Instructions: May shower and wash wound with dial antibacterial soap and water prior to dressing change. Cleanser: Wound Cleanser Other:2 times per day/30 Days Discharge Instructions: Cleanse the wound with wound cleanser prior to applying a clean dressing using gauze sponges, not tissue or cotton balls. Topical: Silvadene Cream Other:2 times per day/30 Days Discharge Instructions: Apply thin layer to wound bed only Secondary Dressing: ABD Pad, 8x10 (Generic) Other:2 times per day/30 Days Discharge Instructions: Apply over primary dressing as directed. Secondary Dressing: NonWoven Sponge, 4x4 (in/in) (Generic) Other:2 times per day/30 Days Discharge Instructions: Apply over primary dressing as directed Secured With: Elastic Bandage 4 inch (ACE bandage) Other:2 times per day/30 Days Discharge Instructions: Secure with ACE bandage as directed. Secured With: [employer], 4.5x3.1 (in/yd) Other:2 times per day/30 Days Discharge Instructions: Secure with Kerlix as  directed. Secured With: [employer] T ape, 2x10 (in/yd) Other:2 times per day/30 Days Discharge Instructions: Secure dressing with tape as directed. WOUND #5: - Foot Wound Laterality: Dorsal, Left Cleanser: Soap and Water Other:2 times per day/30 Days Discharge Instructions: May shower and wash wound with dial antibacterial soap and water prior to dressing change. Cleanser: Wound Cleanser Other:2 times per day/30 Days Discharge Instructions: Cleanse the wound with wound cleanser prior to applying a clean dressing using gauze sponges, not tissue or cotton balls. Topical: Silvadene Cream Other:2 times per day/30 Days Discharge Instructions: Apply thin layer to wound bed only Secondary Dressing: ABD Pad, 8x10 (Generic) Other:2 times per day/30 Days Discharge  Instructions: Apply over primary dressing as directed. Secondary Dressing: NonWoven Sponge, 4x4 (in/in) (Generic) Other:2 times per day/30 Days Discharge Instructions: Apply over primary dressing as directed Secured With: Elastic Bandage 4 inch (ACE bandage) Other:2 times per day/30 Days Discharge Instructions: Secure with ACE bandage as directed. Secured With: [employer], 4.5x3.1 (in/yd) Other:2 times per day/30 Days Discharge Instructions: Secure with Kerlix as directed. Secured With: [employer] T ape, 2x10 (in/yd) Other:2 times per day/30 Days Discharge Instructions: Secure dressing with tape as directed. WOUND #6: - Foot Wound Laterality: Left, Medial, Distal Cleanser: Soap and Water Other:2 times per day/30 Days Discharge Instructions: May shower and wash wound with dial antibacterial soap and water prior to dressing change. Cleanser: Wound Cleanser Other:2 times per day/30 Days Discharge Instructions: Cleanse the wound with wound cleanser prior to applying a clean dressing using gauze sponges, not tissue or cotton balls. Topical: Silvadene Cream Other:2 times per day/30 Days Discharge Instructions: Apply thin layer to wound bed only Secondary Dressing: ABD Pad, 8x10 (Generic) Other:2 times per day/30 Days Discharge Instructions: Apply over primary dressing as directed. Secondary Dressing: NonWoven Sponge, 4x4 (in/in) (Generic) Other:2 times per day/30 Days Discharge Instructions: Apply over primary dressing as directed Secured With: Elastic Bandage 4 inch (ACE bandage) Other:2 times per day/30 Days Discharge Instructions: Secure with ACE bandage as directed. Secured With: [employer], 4.5x3.1 (in/yd) Other:2 times per day/30 Days Discharge Instructions: Secure with Kerlix as directed. Secured With: [employer] T ape, 2x10 (in/yd) Other:2 times per day/30 Days Discharge Instructions: Secure dressing with tape as directed. WOUND #7: - Ankle Wound  Laterality: Right, Lateral Cleanser: Soap and Water Other:2 times per day/30 Days Discharge Instructions: May shower and wash wound with dial antibacterial soap and water prior to dressing change. Cleanser: Wound Cleanser Other:2 times per day/30 Days Discharge Instructions: Cleanse the wound with wound cleanser prior to applying a clean dressing using gauze sponges, not tissue or cotton balls. Topical: Silvadene Cream Other:2 times per day/30 Days Discharge Instructions: Apply thin layer to wound bed only Secondary Dressing: ABD Pad, 8x10 (Generic) Other:2 times per day/30 Days Discharge Instructions: Apply over primary dressing as directed. Secondary Dressing: NonWoven Sponge, 4x4 (in/in) (Generic) Other:2 times per day/30 Days Discharge Instructions: Apply over primary dressing as directed Secured With: Elastic Bandage 4 inch (ACE bandage) Other:2 times per day/30 Days Discharge Instructions: Secure with ACE bandage as directed. Secured With: [employer], 4.5x3.1 (in/yd) Other:2 times per day/30 Days Discharge Instructions: Secure with Kerlix as directed. Secured With: [employer] T ape, 2x10 (in/yd) Other:2 times per day/30 Days Discharge Instructions: Secure dressing with tape as directed. 1. Silvadene 2. Follow-up in 2 weeks Electronic Signature(s) Signed: 05/31/2021 4:15:49 PM By: Kalman Shan DO Entered By: Kalman Shan on 05/31/2021 15:23:08 -------------------------------------------------------------------------------- HxROS Details Patient Name: Date of Service:  Candace A DNA X, [name] 05/31/2021 1:30 PM Medical Record Number: [id number] Patient Account Number: [id number] Date of Birth/Sex: Treating RN: [birth year] (55 y.o. Candace Robinson Primary Care Provider: [name] Other Clinician: Referring Provider: Treating Provider/Extender: Darcey Nora in Treatment: 14 Information Obtained  From Patient Hematologic/Lymphatic Medical History: Positive for: Anemia Cardiovascular Medical History: Positive for: Arrhythmia - A-Fib; Congestive Heart Failure; Hypertension Negative for: Angina Endocrine Medical History: Positive for: Type II Diabetes Past Medical History Notes: Grave's disease Time with diabetes: over 10 years Treated with: Diet Blood sugar tested every day: No Immunological Medical History: Positive for: Lupus Erythematosus Immunizations Pneumococcal Vaccine: Received Pneumococcal Vaccination: No Implantable Devices None Family and Social History Unknown History: Yes; Never smoker; Marital Status - Single; Alcohol Use: Never; Drug Use: No History; Caffeine Use: Rarely; Financial Concerns: No; Food, Clothing or Shelter Needs: No; Support System Lacking: No; Transportation Concerns: No Electronic Signature(s) Signed: 05/31/2021 4:15:49 PM By: Kalman Shan DO Signed: 05/31/2021 6:07:33 PM By: Lorrin Jackson Entered By: Kalman Shan on 05/31/2021 15:18:05 -------------------------------------------------------------------------------- SuperBill Details Patient Name: Date of Service: Candace A [name], [name] 05/31/2021 Medical Record Number: [id number] Patient Account Number: [id number] Date of Birth/Sex: Treating RN: 20-Aug-1965 (55 y.o. Candace Robinson Primary Care Provider: [name] Other Clinician: Referring Provider: Treating Provider/Extender: Darcey Nora in Treatment: 14 Diagnosis Coding ICD-10 Codes Code Description [phone] Non-pressure chronic ulcer of other part of right foot with fat layer exposed L97.522 Non-pressure chronic ulcer of other part of left foot with fat layer exposed I73.9 Peripheral vascular disease, unspecified E11.9 Type 2 diabetes mellitus without complications H37.1 Other local lupus erythematosus I96.78 Chronic diastolic (congestive) heart failure I10 Essential  (primary) hypertension E11.621 Type 2 diabetes mellitus with foot ulcer Facility Procedures CPT4 Code: [id number] Description: 10258 - WOUND CARE VISIT-LEV 5 EST PT Modifier: Quantity: 1 Physician Procedures : CPT4 Code Description Modifier [id number] - WC PHYS LEVEL 3 - EST PT ICD-10 Diagnosis Description L97.512 Non-pressure chronic ulcer of other part of right foot with fat layer exposed L97.522 Non-pressure chronic ulcer of other part of left foot  with fat layer exposed Quantity: 1 Electronic Signature(s) Signed: 05/31/2021 3:33:12 PM By: Lorrin Jackson Signed: 05/31/2021 4:15:49 PM By: Kalman Shan DO Entered By: Lorrin Jackson on 05/31/2021 15:33:12

## 2021-05-31 NOTE — Progress Notes (Addendum)
Candace Robinson ([id number]) Visit Report for 05/31/2021 Arrival Information Details Patient Name: Date of Service: Candace Robinson, [name] 05/31/2021 1:30 PM Medical Record Number: [id number] Patient Account Number: [id number] Date of Birth/Sex: Treating RN: 12/21/1965 (55 y.o. Candace Robinson, Meta.Reding Primary Care Meriel Kelliher: [name] Other Clinician: Referring [name]: Treating Aveon Colquhoun/Extender: Darcey Nora in Treatment: 14 Visit Information History Since Last Visit Added or deleted any medications: No Patient Arrived: Candace Robinson Any new allergies or adverse reactions: No Arrival Time: 14:18 Had a fall or experienced change in No Transfer Assistance: None activities of daily living that may affect Patient Identification Verified: Yes risk of falls: Secondary Verification Process Completed: Yes Signs or symptoms of abuse/neglect since No Patient Requires Transmission-Based No last visito Precautions: Hospitalized since last visit: No Patient Has Alerts: Yes Implantable device outside of the clinic No Patient Alerts: R ABI: 1.0 L ABI: 1.1 excluding NO BENZOCAINE cellular tissue based products placed in the USE LIDOCAINE GEL center ONLY since last visit: Has Dressing in Place as Prescribed: Yes Has Footwear/Offloading in Place as Yes Prescribed: Left: Surgical Robinson with Pressure Relief Insole Right: Surgical Robinson with Pressure Relief Insole Pain Present Now: Yes Electronic Signature(s) Signed: 05/31/2021 6:26:42 PM By: Deon Pilling RN, BSN Entered By: Deon Pilling on 05/31/2021 14:19:13 -------------------------------------------------------------------------------- Clinic Level of Care Assessment Details Patient Name: Date of Service: Candace Robinson, [name] 05/31/2021 1:30 PM Medical Record Number: [id number] Patient Account Number: [id number] Date of Birth/Sex: Treating RN: [birth year] (55 y.o. Candace Robinson Primary Care Jeymi Hepp:  [name] Other Clinician: Referring Shanetha Bradham: Treating Detron Carras/Extender: Darcey Nora in Treatment: 14 Clinic Level of Care Assessment Items TOOL 4 Quantity Score Robinson- 1 0 Use when only an EandM is performed on FOLLOW-UP visit ASSESSMENTS - Nursing Assessment / Reassessment Robinson- 1 10 Reassessment of Co-morbidities (includes updates in patient status) Robinson- 1 5 Reassessment of Adherence to Treatment Plan ASSESSMENTS - Wound and Skin A ssessment / Reassessment []  - 0 Simple Wound Assessment / Reassessment - one wound Robinson- 7 5 Complex Wound Assessment / Reassessment - multiple wounds []  - 0 Dermatologic / Skin Assessment (not related to wound area) ASSESSMENTS - Focused Assessment []  - 0 Circumferential Edema Measurements - multi extremities []  - 0 Nutritional Assessment / Counseling / Intervention []  - 0 Lower Extremity Assessment (monofilament, tuning fork, pulses) []  - 0 Peripheral Arterial Disease Assessment (using hand held doppler) ASSESSMENTS - Ostomy and/or Continence Assessment and Care []  - 0 Incontinence Assessment and Management []  - 0 Ostomy Care Assessment and Management (repouching, etc.) PROCESS - Coordination of Care []  - 0 Simple Patient / Family Education for ongoing care Robinson- 1 20 Complex (extensive) Patient / Family Education for ongoing care []  - 0 Staff obtains Programmer, systems, Records, T Results / Process Orders est []  - 0 Staff telephones HHA, Nursing Homes / Clarify orders / etc []  - 0 Routine Transfer to another Facility (non-emergent condition) []  - 0 Routine Hospital Admission (non-emergent condition) []  - 0 New Admissions / Biomedical engineer / Ordering NPWT Apligraf, etc. , []  - 0 Emergency Hospital Admission (emergent condition) []  - 0 Simple Discharge Coordination []  - 0 Complex (extensive) Discharge Coordination PROCESS - Special Needs []  - 0 Pediatric / Minor Patient Management []  - 0 Isolation  Patient Management []  - 0 Hearing / Language / Visual special needs []  - 0 Assessment of Community assistance (transportation, D/C planning, etc.) []  - 0 Additional assistance /  Altered mentation []  - 0 Support Surface(s) Assessment (bed, cushion, seat, etc.) INTERVENTIONS - Wound Cleansing / Measurement []  - 0 Simple Wound Cleansing - one wound Robinson- 7 5 Complex Wound Cleansing - multiple wounds Robinson- 1 5 Wound Imaging (photographs - any number of wounds) []  - 0 Wound Tracing (instead of photographs) []  - 0 Simple Wound Measurement - one wound Robinson- 7 5 Complex Wound Measurement - multiple wounds INTERVENTIONS - Wound Dressings []  - 0 Small Wound Dressing one or multiple wounds []  - 0 Medium Wound Dressing one or multiple wounds Robinson- 2 20 Large Wound Dressing one or multiple wounds []  - 0 Application of Medications - topical []  - 0 Application of Medications - injection INTERVENTIONS - Miscellaneous []  - 0 External ear exam []  - 0 Specimen Collection (cultures, biopsies, blood, body fluids, etc.) []  - 0 Specimen(s) / Culture(s) sent or taken to Lab for analysis []  - 0 Patient Transfer (multiple staff / Civil Service fast streamer / Similar devices) []  - 0 Simple Staple / Suture removal (25 or less) []  - 0 Complex Staple / Suture removal (26 or more) []  - 0 Hypo / Hyperglycemic Management (close monitor of Blood Glucose) []  - 0 Ankle / Brachial Index (ABI) - do not check if billed separately Robinson- 1 5 Vital Signs Has the patient been seen at the hospital within the last three years: Yes Total Score: 190 Level Of Care: New/Established - Level 5 Electronic Signature(s) Signed: 05/31/2021 6:07:33 PM By: Lorrin Jackson Entered By: Lorrin Jackson on 05/31/2021 15:33:02 -------------------------------------------------------------------------------- Complex / Palliative Patient Assessment Details Patient Name: Date of Service: Candace Robinson, [name] 05/31/2021 1:30 PM Medical Record Number:  [id number] Patient Account Number: [id number] Date of Birth/Sex: Treating RN: [birth year] (55 y.o. Candace Robinson Primary Care Sharah Finnell: [name] Other Clinician: Referring [name]: Treating [name]: Darcey Nora in Treatment: 14 Palliative Management Criteria Complex Wound Management Criteria Patient has remarkable or complex co-morbidities requiring medications or treatments that extend wound healing times. Examples: Diabetes mellitus with chronic renal failure or end stage renal disease requiring dialysis Advanced or poorly controlled rheumatoid arthritis Diabetes mellitus and end stage chronic obstructive pulmonary disease Active cancer with current chemo- or radiation therapy Type 2 DM, Lupus, CHF, PVD Care Approach Wound Care Plan: Complex Wound Management Electronic Signature(s) Signed: 06/01/2021 9:20:48 AM By: Kalman Shan DO Signed: 06/01/2021 5:45:41 PM By: Levan Hurst RN, BSN Entered By: Levan Hurst on 06/01/2021 08:57:39 -------------------------------------------------------------------------------- Encounter Discharge Information Details Patient Name: Date of Service: Candace A [name], [name] 05/31/2021 1:30 PM Medical Record Number: [id number] Patient Account Number: [id number] Date of Birth/Sex: Treating RN: 12/31/1965 (55 y.o. Candace Robinson Primary Care Jasie Meleski: [name] Other Clinician: Referring Dalyn Becker: Treating Garwood Wentzell/Extender: Darcey Nora in Treatment: 14 Encounter Discharge Information Items Discharge Condition: Stable Ambulatory Status: Walker Discharge Destination: Home Transportation: Private Auto Schedule Follow-up Appointment: Yes Clinical Summary of Care: Provided on 05/31/2021 Form Type Recipient Paper Patient Patient Electronic Signature(s) Signed: 05/31/2021 4:54:01 PM By: Lorrin Jackson Entered By: Lorrin Jackson on 05/31/2021  16:54:01 -------------------------------------------------------------------------------- Lower Extremity Assessment Details Patient Name: Date of Service: Candace Robinson, [name] 05/31/2021 1:30 PM Medical Record Number: [id number] Patient Account Number: [id number] Date of Birth/Sex: Treating RN: [birth year] (55 y.o. Debby Bud Primary Care Jocee Kissick: [name] Other Clinician: Referring Megumi Treaster: Treating Davion Meara/Extender: [name] Weeks in Treatment: 14 Edema Assessment Assessed: [name]: Yes] [Right: Yes] Edema: [  Left: Yes] [Right: Yes] Calf Left: Right: Point of Measurement: 29 cm From Medial Instep 36 cm 36.5 cm Ankle Left: Right: Point of Measurement: 10 cm From Medial Instep 25.5 cm 26 cm Vascular Assessment Pulses: Dorsalis Pedis Palpable: [Left:No] [Right:No] Electronic Signature(s) Signed: 05/31/2021 6:26:42 PM By: Deon Pilling RN, BSN Entered By: Deon Pilling on 05/31/2021 14:54:06 -------------------------------------------------------------------------------- Multi Wound Chart Details Patient Name: Date of Service: Candace Robinson, [name] 05/31/2021 1:30 PM Medical Record Number: [id number] Patient Account Number: [id number] Date of Birth/Sex: Treating RN: 06-Jul-1965 (55 y.o. Candace Robinson Primary Care Keahi Mccarney: [name] Other Clinician: Referring Meira Wahba: Treating Tanelle Lanzo/Extender: [name] Weeks in Treatment: 14 Vital Signs Height(in): 66 Pulse(bpm): 99 Weight(lbs): 157 Blood Pressure(mmHg): 96/65 Body Mass Index(BMI): 28 Temperature(F): 98.5 Respiratory Rate(breaths/min): 18 Photos: Right, Medial Ankle Right, Dorsal Foot Left, Medial Ankle Wound Location: Gradually Appeared Gradually Appeared Gradually Appeared Wounding Event: Diabetic Wound/Ulcer of the Lower Diabetic Wound/Ulcer of the Lower Diabetic Wound/Ulcer of the Lower Primary Etiology: Extremity  Extremity Extremity Anemia, Arrhythmia, Congestive Heart Anemia, Arrhythmia, Congestive Heart Anemia, Arrhythmia, Congestive Heart Comorbid History: Failure, Hypertension, Type II Failure, Hypertension, Type II Failure, Hypertension, Type II Diabetes, Lupus Erythematosus Diabetes, Lupus Erythematosus Diabetes, Lupus Erythematosus 04/03/2020 04/03/2020 02/22/2021 Date Acquired: 14 14 14  Weeks of Treatment: Open Open Open Wound Status: 6.4x5.3x0.3 7x5.5x0.1 1x2.8x0.3 Measurements [name] [name] D (cm) 26.641 30.238 2.199 A (cm) : rea 7.992 3.024 0.66 Volume (cm) : -61.50% -156.70% -12.00% % Reduction in A rea: -142.30% -156.70% -67.90% % Reduction in Volume: Grade 2 Grade 2 Grade 2 Classification: Medium Medium Medium Exudate A mount: Serosanguineous Serosanguineous Serosanguineous Exudate Type: red, brown red, brown red, brown Exudate Color: Distinct, outline attached Distinct, outline attached Distinct, outline attached Wound Margin: Small (1-33%) Small (1-33%) Small (1-33%) Granulation A mount: N/A Red Red Granulation Quality: Large (67-100%) Large (67-100%) Large (67-100%) Necrotic A mount: Eschar, Adherent Slough Eschar, Adherent [employer] Necrotic Tissue: Fat Layer (Subcutaneous Tissue): Yes Fat Layer (Subcutaneous Tissue): Yes Fat Layer (Subcutaneous Tissue): Yes Exposed Structures: Fascia: No Fascia: No Fascia: No Tendon: No Tendon: No Tendon: No Muscle: No Muscle: No Muscle: No Joint: No Joint: No Joint: No Bone: No Bone: No Bone: No Small (1-33%) Small (1-33%) Small (1-33%) Epithelialization: Wound Number: 4 5 6  Photos: Left, Lateral Ankle Left, Dorsal Foot Left, Distal, Medial Foot Wound Location: Gradually Appeared Gradually Appeared Gradually Appeared Wounding Event: Diabetic Wound/Ulcer of the Lower Diabetic Wound/Ulcer of the Lower Diabetic Wound/Ulcer of the Lower Primary Etiology: Extremity Extremity Extremity Anemia, Arrhythmia,  Congestive Heart Anemia, Arrhythmia, Congestive Heart Anemia, Arrhythmia, Congestive Heart Comorbid History: Failure, Hypertension, Type II Failure, Hypertension, Type II Failure, Hypertension, Type II Diabetes, Lupus Erythematosus Diabetes, Lupus Erythematosus Diabetes, Lupus Erythematosus 04/03/2020 04/03/2020 04/03/2020 Date Acquired: 14 14 14  Weeks of Treatment: Open Open Open Wound Status: 8.3x6x0.4 9x9x0.3 1.2x0.8x0.1 Measurements [name] [name] D (cm) 39.113 63.617 0.754 A (cm) : rea 15.645 19.085 0.075 Volume (cm) : -3.80% -5.90% -140.10% % Reduction in A rea: -107.50% -217.70% -141.90% % Reduction in Volume: Grade 2 Grade 2 Grade 2 Classification: Medium Medium Small Exudate A mount: Serosanguineous Serosanguineous Serosanguineous Exudate Type: red, brown red, brown red, brown Exudate Color: Distinct, outline attached Distinct, outline attached Distinct, outline attached Wound Margin: Small (1-33%) Small (1-33%) Medium (34-66%) Granulation A mount: Red Red Red Granulation Quality: Large (67-100%) Large (67-100%) Medium (34-66%) Necrotic A mount: Eschar, Adherent [place] Necrotic Tissue: Fat  Layer (Subcutaneous Tissue): Yes Fat Layer (Subcutaneous Tissue): Yes Fat Layer (Subcutaneous Tissue): Yes Exposed Structures: Fascia: No Fascia: No Fascia: No Tendon: No Tendon: No Tendon: No Muscle: No Muscle: No Muscle: No Joint: No Joint: No Joint: No Bone: No Bone: No Bone: No None Small (1-33%) Medium (34-66%) Epithelialization: Wound Number: 7 N/A N/A Photos: N/A N/A Right, Lateral Ankle N/A N/A Wound Location: Gradually Appeared N/A N/A Wounding Event: Lupus N/A N/A Primary Etiology: Anemia, Arrhythmia, Congestive Heart N/A N/A Comorbid History: Failure, Hypertension, Type II Diabetes, Lupus Erythematosus 04/08/2021 N/A N/A Date Acquired: 7 N/A N/A Weeks of Treatment: Open N/A N/A Wound Status: 1.5x1.4x0.2  N/A N/A Measurements [name] [name] D (cm) 1.649 N/A N/A A (cm) : rea 0.33 N/A N/A Volume (cm) : -5219.40% N/A N/A % Reduction in Area: -10900.00% N/A N/A % Reduction in Volume: Full Thickness Without Exposed N/A N/A Classification: Support Structures Medium N/A N/A Exudate A mount: Serosanguineous N/A N/A Exudate Type: red, brown N/A N/A Exudate Color: Distinct, outline attached N/A N/A Wound Margin: Medium (34-66%) N/A N/A Granulation Amount: Pink N/A N/A Granulation Quality: Medium (34-66%) N/A N/A Necrotic Amount: Adherent Slough N/A N/A Necrotic Tissue: Fat Layer (Subcutaneous Tissue): Yes N/A N/A Exposed Structures: Fascia: No Tendon: No Muscle: No Joint: No Bone: No Small (1-33%) N/A N/A Epithelialization: Treatment Notes Electronic Signature(s) Signed: 05/31/2021 4:15:49 PM By: Kalman Shan DO Signed: 05/31/2021 6:07:33 PM By: Lorrin Jackson Entered By: Kalman Shan on 05/31/2021 15:15:44 -------------------------------------------------------------------------------- Multi-Disciplinary Care Plan Details Patient Name: Date of Service: Candace Robinson, [name] 05/31/2021 1:30 PM Medical Record Number: [id number] Patient Account Number: [id number] Date of Birth/Sex: Treating RN: 1966/01/19 (55 y.o. Candace Robinson, Tammi Klippel Primary Care Hyun Reali: [name] Other Clinician: Referring Kilynn Fitzsimmons: Treating Grayland Daisey/Extender: Darcey Nora in Treatment: 14 Active Inactive Wound/Skin Impairment Nursing Diagnoses: Impaired tissue integrity Goals: Patient/caregiver will verbalize understanding of skin care regimen Date Initiated: 02/22/2021 Target Resolution Date: 06/29/2021 Goal Status: Active Interventions: Assess patient/caregiver ability to obtain necessary supplies Assess patient/caregiver ability to perform ulcer/skin care regimen upon admission and as needed Assess ulceration(s) every visit Provide education on ulcer  and skin care Treatment Activities: Topical wound management initiated : 02/22/2021 Notes: 03/25/21: All wounds not yet at 30%, culture positive. Using gent cream. 05/31/21: Wound regimen continues, patient has not been compliant with all aspects of wound treatment. Electronic Signature(s) Signed: 05/31/2021 6:26:42 PM By: Deon Pilling RN, BSN Entered By: Deon Pilling on 05/31/2021 15:02:30 -------------------------------------------------------------------------------- Pain Assessment Details Patient Name: Date of Service: Candace Robinson, [name] 05/31/2021 1:30 PM Medical Record Number: [id number] Patient Account Number: [id number] Date of Birth/Sex: Treating RN: December 15, 1965 (55 y.o. Debby Bud Primary Care Maleigh Bagot: [name] Other Clinician: Referring Zykeria Laguardia: Treating Crissy Mccreadie/Extender: Darcey Nora in Treatment: 14 Active Problems Location of Pain Severity and Description of Pain Patient Has Paino Yes Site Locations Pain Location: Pain in Ulcers With Dressing Change: Yes Duration of the Pain. Constant / Intermittento Intermittent Rate the pain. Current Pain Level: 5 Character of Pain Describe the Pain: Aching, Burning, Throbbing Pain Management and Medication Current Pain Management: Medication: Yes Cold Application: No Rest: Yes Massage: No Activity: No T.E.N.S.: No Heat Application: No Leg drop or elevation: No Is the Current Pain Management Adequate: Inadequate How does your wound impact your activities of daily livingo Sleep: No Bathing: No Appetite: No Relationship With Others: No Bladder Continence: No Emotions: No Bowel Continence: No Work: No Toileting:  No Drive: No Dressing: No Hobbies: No Electronic Signature(s) Signed: 05/31/2021 6:26:42 PM By: Deon Pilling RN, BSN Entered By: Deon Pilling on 05/31/2021  14:19:48 -------------------------------------------------------------------------------- Patient/Caregiver Education Details Patient Name: Date of Service: Candace A [name], [name] 11/28/2022andnbsp1:30 PM Medical Record Number: [id number] Patient Account Number: [id number] Date of Birth/Gender: Treating RN: 12/05/1965 (55 y.o. Debby Bud Primary Care Physician: [name] Other Clinician: Referring Physician: Treating Physician/Extender: Darcey Nora in Treatment: 14 Education Assessment Education Provided To: Patient Education Topics Provided Infection: Methods: Explain/Verbal Responses: State content correctly Wound/Skin Impairment: Methods: Explain/Verbal, Printed Responses: State content correctly Electronic Signature(s) Signed: 05/31/2021 6:26:42 PM By: Deon Pilling RN, BSN Entered By: Deon Pilling on 05/31/2021 15:04:29 -------------------------------------------------------------------------------- Wound Assessment Details Patient Name: Date of Service: Candace Robinson, [name] 05/31/2021 1:30 PM Medical Record Number: [id number] Patient Account Number: [id number] Date of Birth/Sex: Treating RN: 1966/01/07 (55 y.o. Candace Robinson Primary Care Darivs Lunden: [name] Other Clinician: Referring Lord Lancour: Treating Ashten Prats/Extender: [name] Weeks in Treatment: 14 Wound Status Wound Number: 1 Primary Diabetic Wound/Ulcer of the Lower Extremity Etiology: Wound Location: Right, Medial Ankle Wound Open Wounding Event: Gradually Appeared Status: Date Acquired: 04/03/2020 Comorbid Anemia, Arrhythmia, Congestive Heart Failure, Hypertension, Weeks Of Treatment: 14 History: Type II Diabetes, Lupus Erythematosus Clustered Wound: No Photos Wound Measurements Length: (cm) 6.4 Width: (cm) 5.3 Depth: (cm) 0.3 Area: (cm) 26.641 Volume: (cm) 7.992 % Reduction in Area: -61.5% % Reduction in  Volume: -142.3% Epithelialization: Small (1-33%) Tunneling: No Undermining: No Wound Description Classification: Grade 2 Wound Margin: Distinct, outline attached Exudate Amount: Medium Exudate Type: Serosanguineous Exudate Color: red, brown Foul Odor After Cleansing: No Slough/Fibrino Yes Wound Bed Granulation Amount: Small (1-33%) Exposed Structure Necrotic Amount: Large (67-100%) Fascia Exposed: No Necrotic Quality: Eschar, Adherent Slough Fat Layer (Subcutaneous Tissue) Exposed: Yes Tendon Exposed: No Muscle Exposed: No Joint Exposed: No Bone Exposed: No Treatment Notes Wound #1 (Ankle) Wound Laterality: Right, Medial Cleanser Soap and Water Discharge Instruction: May shower and wash wound with dial antibacterial soap and water prior to dressing change. Wound Cleanser Discharge Instruction: Cleanse the wound with wound cleanser prior to applying a clean dressing using gauze sponges, not tissue or cotton balls. Peri-Wound Care Topical Silvadene Cream Discharge Instruction: Apply thin layer to wound bed only Primary Dressing Secondary Dressing ABD Pad, 8x10 Discharge Instruction: Apply over primary dressing as directed. NonWoven Sponge, 4x4 (in/in) Discharge Instruction: Apply over primary dressing as directed Secured With Elastic Bandage 4 inch (ACE bandage) Discharge Instruction: Secure with ACE bandage as directed. Kerlix Roll Sterile, 4.5x3.1 (in/yd) Discharge Instruction: Secure with Kerlix as directed. Transpore Surgical Tape, 2x10 (in/yd) Discharge Instruction: Secure dressing with tape as directed. Compression Wrap Compression Stockings Add-Ons Electronic Signature(s) Signed: 05/31/2021 6:07:33 PM By: Lorrin Jackson Entered By: Lorrin Jackson on 05/31/2021 14:39:06 -------------------------------------------------------------------------------- Wound Assessment Details Patient Name: Date of Service: Candace Robinson, [name] 05/31/2021 1:30 PM Medical  Record Number: [id number] Patient Account Number: [id number] Date of Birth/Sex: Treating RN: July 13, 1965 (55 y.o. Candace Robinson Primary Care Shineka Auble: [name] Other Clinician: Referring Senai Kingsley: Treating Analaura Messler/Extender: [name] Weeks in Treatment: 14 Wound Status Wound Number: 2 Primary Diabetic Wound/Ulcer of the Lower Extremity Etiology: Wound Location: Right, Dorsal Foot Wound Open Wounding Event: Gradually Appeared Status: Date Acquired: 04/03/2020 Comorbid Anemia, Arrhythmia, Congestive Heart Failure, Hypertension, Weeks Of Treatment: 14 History: Type II Diabetes, Lupus Erythematosus Clustered Wound: No Photos Wound Measurements  Length: (cm) 7 Width: (cm) 5.5 Depth: (cm) 0.1 Area: (cm) 30.238 Volume: (cm) 3.024 % Reduction in Area: -156.7% % Reduction in Volume: -156.7% Epithelialization: Small (1-33%) Tunneling: No Undermining: No Wound Description Classification: Grade 2 Wound Margin: Distinct, outline attached Exudate Amount: Medium Exudate Type: Serosanguineous Exudate Color: red, brown Foul Odor After Cleansing: No Slough/Fibrino Yes Wound Bed Granulation Amount: Small (1-33%) Exposed Structure Granulation Quality: Red Fascia Exposed: No Necrotic Amount: Large (67-100%) Fat Layer (Subcutaneous Tissue) Exposed: Yes Necrotic Quality: Eschar, Adherent Slough Tendon Exposed: No Muscle Exposed: No Joint Exposed: No Bone Exposed: No Treatment Notes Wound #2 (Foot) Wound Laterality: Dorsal, Right Cleanser Soap and Water Discharge Instruction: May shower and wash wound with dial antibacterial soap and water prior to dressing change. Wound Cleanser Discharge Instruction: Cleanse the wound with wound cleanser prior to applying a clean dressing using gauze sponges, not tissue or cotton balls. Peri-Wound Care Topical Silvadene Cream Discharge Instruction: Apply thin layer to wound bed only Primary  Dressing Secondary Dressing ABD Pad, 8x10 Discharge Instruction: Apply over primary dressing as directed. NonWoven Sponge, 4x4 (in/in) Discharge Instruction: Apply over primary dressing as directed Secured With Elastic Bandage 4 inch (ACE bandage) Discharge Instruction: Secure with ACE bandage as directed. Kerlix Roll Sterile, 4.5x3.1 (in/yd) Discharge Instruction: Secure with Kerlix as directed. Transpore Surgical Tape, 2x10 (in/yd) Discharge Instruction: Secure dressing with tape as directed. Compression Wrap Compression Stockings Add-Ons Electronic Signature(s) Signed: 05/31/2021 6:07:33 PM By: Lorrin Jackson Entered By: Lorrin Jackson on 05/31/2021 14:40:50 -------------------------------------------------------------------------------- Wound Assessment Details Patient Name: Date of Service: Candace Robinson, [name] 05/31/2021 1:30 PM Medical Record Number: [id number] Patient Account Number: [id number] Date of Birth/Sex: Treating RN: 1966-04-29 (55 y.o. Candace Robinson Primary Care Barry Faircloth: [name] Other Clinician: Referring Maanvi Lecompte: Treating Mishawn Hemann/Extender: [name] Weeks in Treatment: 14 Wound Status Wound Number: 3 Primary Diabetic Wound/Ulcer of the Lower Extremity Etiology: Wound Location: Left, Medial Ankle Wound Open Wounding Event: Gradually Appeared Status: Date Acquired: 02/22/2021 Comorbid Anemia, Arrhythmia, Congestive Heart Failure, Hypertension, Weeks Of Treatment: 14 History: Type II Diabetes, Lupus Erythematosus Clustered Wound: No Photos Wound Measurements Length: (cm) 1 Width: (cm) 2.8 Depth: (cm) 0.3 Area: (cm) 2.199 Volume: (cm) 0.66 % Reduction in Area: -12% % Reduction in Volume: -67.9% Epithelialization: Small (1-33%) Tunneling: No Undermining: No Wound Description Classification: Grade 2 Wound Margin: Distinct, outline attached Exudate Amount: Medium Exudate Type:  Serosanguineous Exudate Color: red, brown Foul Odor After Cleansing: No Slough/Fibrino Yes Wound Bed Granulation Amount: Small (1-33%) Exposed Structure Granulation Quality: Red Fascia Exposed: No Necrotic Amount: Large (67-100%) Fat Layer (Subcutaneous Tissue) Exposed: Yes Necrotic Quality: Adherent Slough Tendon Exposed: No Muscle Exposed: No Joint Exposed: No Bone Exposed: No Treatment Notes Wound #3 (Ankle) Wound Laterality: Left, Medial Cleanser Soap and Water Discharge Instruction: May shower and wash wound with dial antibacterial soap and water prior to dressing change. Wound Cleanser Discharge Instruction: Cleanse the wound with wound cleanser prior to applying a clean dressing using gauze sponges, not tissue or cotton balls. Peri-Wound Care Topical Silvadene Cream Discharge Instruction: Apply thin layer to wound bed only Primary Dressing Secondary Dressing ABD Pad, 8x10 Discharge Instruction: Apply over primary dressing as directed. NonWoven Sponge, 4x4 (in/in) Discharge Instruction: Apply over primary dressing as directed Secured With Elastic Bandage 4 inch (ACE bandage) Discharge Instruction: Secure with ACE bandage as directed. Kerlix Roll Sterile, 4.5x3.1 (in/yd) Discharge Instruction: Secure with Kerlix as directed. Transpore Surgical Tape, 2x10 (in/yd) Discharge Instruction: Secure dressing with  tape as directed. Compression Wrap Compression Stockings Add-Ons Electronic Signature(s) Signed: 05/31/2021 6:07:33 PM By: Lorrin Jackson Entered By: Lorrin Jackson on 05/31/2021 14:42:26 -------------------------------------------------------------------------------- Wound Assessment Details Patient Name: Date of Service: Candace Robinson, [name] 05/31/2021 1:30 PM Medical Record Number: [id number] Patient Account Number: [id number] Date of Birth/Sex: Treating RN: Oct 19, 1965 (55 y.o. Candace Robinson Primary Care Eulises Kijowski: [name] Other  Clinician: Referring Celisse Ciulla: Treating Aianna Fahs/Extender: [name] Weeks in Treatment: 14 Wound Status Wound Number: 4 Primary Diabetic Wound/Ulcer of the Lower Extremity Etiology: Wound Location: Left, Lateral Ankle Wound Open Wounding Event: Gradually Appeared Status: Date Acquired: 04/03/2020 Comorbid Anemia, Arrhythmia, Congestive Heart Failure, Hypertension, Weeks Of Treatment: 14 History: Type II Diabetes, Lupus Erythematosus Clustered Wound: No Photos Wound Measurements Length: (cm) 8.3 Width: (cm) 6 Depth: (cm) 0.4 Area: (cm) 39.113 Volume: (cm) 15.645 % Reduction in Area: -3.8% % Reduction in Volume: -107.5% Epithelialization: None Tunneling: No Undermining: No Wound Description Classification: Grade 2 Wound Margin: Distinct, outline attached Exudate Amount: Medium Exudate Type: Serosanguineous Exudate Color: red, brown Foul Odor After Cleansing: No Slough/Fibrino Yes Wound Bed Granulation Amount: Small (1-33%) Exposed Structure Granulation Quality: Red Fascia Exposed: No Necrotic Amount: Large (67-100%) Fat Layer (Subcutaneous Tissue) Exposed: Yes Necrotic Quality: Eschar, Adherent Slough Tendon Exposed: No Muscle Exposed: No Joint Exposed: No Bone Exposed: No Treatment Notes Wound #4 (Ankle) Wound Laterality: Left, Lateral Cleanser Soap and Water Discharge Instruction: May shower and wash wound with dial antibacterial soap and water prior to dressing change. Wound Cleanser Discharge Instruction: Cleanse the wound with wound cleanser prior to applying a clean dressing using gauze sponges, not tissue or cotton balls. Peri-Wound Care Topical Silvadene Cream Discharge Instruction: Apply thin layer to wound bed only Primary Dressing Secondary Dressing ABD Pad, 8x10 Discharge Instruction: Apply over primary dressing as directed. NonWoven Sponge, 4x4 (in/in) Discharge Instruction: Apply over primary dressing as  directed Secured With Elastic Bandage 4 inch (ACE bandage) Discharge Instruction: Secure with ACE bandage as directed. Kerlix Roll Sterile, 4.5x3.1 (in/yd) Discharge Instruction: Secure with Kerlix as directed. Transpore Surgical Tape, 2x10 (in/yd) Discharge Instruction: Secure dressing with tape as directed. Compression Wrap Compression Stockings Add-Ons Electronic Signature(s) Signed: 05/31/2021 6:07:33 PM By: Lorrin Jackson Entered By: Lorrin Jackson on 05/31/2021 14:43:46 -------------------------------------------------------------------------------- Wound Assessment Details Patient Name: Date of Service: Candace Robinson, [name] 05/31/2021 1:30 PM Medical Record Number: [id number] Patient Account Number: [id number] Date of Birth/Sex: Treating RN: [birth year] (55 y.o. Candace Robinson Primary Care Bhakti Labella: [name] Other Clinician: Referring Ladan Vanderzanden: Treating Arshia Spellman/Extender: [name] Weeks in Treatment: 14 Wound Status Wound Number: 5 Primary Diabetic Wound/Ulcer of the Lower Extremity Etiology: Wound Location: Left, Dorsal Foot Wound Open Wounding Event: Gradually Appeared Status: Date Acquired: 04/03/2020 Comorbid Anemia, Arrhythmia, Congestive Heart Failure, Hypertension, Weeks Of Treatment: 14 History: Type II Diabetes, Lupus Erythematosus Clustered Wound: No Photos Wound Measurements Length: (cm) 9 Width: (cm) 9 Depth: (cm) 0.3 Area: (cm) 63.617 Volume: (cm) 19.085 % Reduction in Area: -5.9% % Reduction in Volume: -217.7% Epithelialization: Small (1-33%) Tunneling: No Undermining: No Wound Description Classification: Grade 2 Wound Margin: Distinct, outline attached Exudate Amount: Medium Exudate Type: Serosanguineous Exudate Color: red, brown Foul Odor After Cleansing: No Slough/Fibrino Yes Wound Bed Granulation Amount: Small (1-33%) Exposed Structure Granulation Quality: Red Fascia Exposed: No Necrotic  Amount: Large (67-100%) Fat Layer (Subcutaneous Tissue) Exposed: Yes Necrotic Quality: Eschar, Adherent Slough Tendon Exposed: No Muscle Exposed: No Joint Exposed: No Bone Exposed:  No Treatment Notes Wound #5 (Foot) Wound Laterality: Dorsal, Left Cleanser Soap and Water Discharge Instruction: May shower and wash wound with dial antibacterial soap and water prior to dressing change. Wound Cleanser Discharge Instruction: Cleanse the wound with wound cleanser prior to applying a clean dressing using gauze sponges, not tissue or cotton balls. Peri-Wound Care Topical Silvadene Cream Discharge Instruction: Apply thin layer to wound bed only Primary Dressing Secondary Dressing ABD Pad, 8x10 Discharge Instruction: Apply over primary dressing as directed. NonWoven Sponge, 4x4 (in/in) Discharge Instruction: Apply over primary dressing as directed Secured With Elastic Bandage 4 inch (ACE bandage) Discharge Instruction: Secure with ACE bandage as directed. Kerlix Roll Sterile, 4.5x3.1 (in/yd) Discharge Instruction: Secure with Kerlix as directed. Transpore Surgical Tape, 2x10 (in/yd) Discharge Instruction: Secure dressing with tape as directed. Compression Wrap Compression Stockings Add-Ons Electronic Signature(s) Signed: 05/31/2021 6:07:33 PM By: Lorrin Jackson Entered By: Lorrin Jackson on 05/31/2021 14:44:57 -------------------------------------------------------------------------------- Wound Assessment Details Patient Name: Date of Service: Candace Robinson, [name] 05/31/2021 1:30 PM Medical Record Number: [id number] Patient Account Number: [id number] Date of Birth/Sex: Treating RN: [birth year] (55 y.o. Candace Robinson Primary Care Baran Kuhrt: [name] Other Clinician: Referring Mitzy Naron: Treating Maily Debarge/Extender: [name] Weeks in Treatment: 14 Wound Status Wound Number: 6 Primary Diabetic Wound/Ulcer of the Lower  Extremity Etiology: Wound Location: Left, Distal, Medial Foot Wound Open Wounding Event: Gradually Appeared Status: Date Acquired: 04/03/2020 Comorbid Anemia, Arrhythmia, Congestive Heart Failure, Hypertension, Weeks Of Treatment: 14 History: Type II Diabetes, Lupus Erythematosus Clustered Wound: No Photos Wound Measurements Length: (cm) 1.2 Width: (cm) 0.8 Depth: (cm) 0.1 Area: (cm) 0.754 Volume: (cm) 0.075 % Reduction in Area: -140.1% % Reduction in Volume: -141.9% Epithelialization: Medium (34-66%) Tunneling: No Undermining: No Wound Description Classification: Grade 2 Wound Margin: Distinct, outline attached Exudate Amount: Small Exudate Type: Serosanguineous Exudate Color: red, brown Foul Odor After Cleansing: No Slough/Fibrino Yes Wound Bed Granulation Amount: Medium (34-66%) Exposed Structure Granulation Quality: Red Fascia Exposed: No Necrotic Amount: Medium (34-66%) Fat Layer (Subcutaneous Tissue) Exposed: Yes Necrotic Quality: Adherent Slough Tendon Exposed: No Muscle Exposed: No Joint Exposed: No Bone Exposed: No Treatment Notes Wound #6 (Foot) Wound Laterality: Left, Medial, Distal Cleanser Soap and Water Discharge Instruction: May shower and wash wound with dial antibacterial soap and water prior to dressing change. Wound Cleanser Discharge Instruction: Cleanse the wound with wound cleanser prior to applying a clean dressing using gauze sponges, not tissue or cotton balls. Peri-Wound Care Topical Silvadene Cream Discharge Instruction: Apply thin layer to wound bed only Primary Dressing Secondary Dressing ABD Pad, 8x10 Discharge Instruction: Apply over primary dressing as directed. NonWoven Sponge, 4x4 (in/in) Discharge Instruction: Apply over primary dressing as directed Secured With Elastic Bandage 4 inch (ACE bandage) Discharge Instruction: Secure with ACE bandage as directed. Kerlix Roll Sterile, 4.5x3.1 (in/yd) Discharge Instruction:  Secure with Kerlix as directed. Transpore Surgical Tape, 2x10 (in/yd) Discharge Instruction: Secure dressing with tape as directed. Compression Wrap Compression Stockings Add-Ons Electronic Signature(s) Signed: 05/31/2021 6:07:33 PM By: Lorrin Jackson Entered By: Lorrin Jackson on 05/31/2021 14:46:31 -------------------------------------------------------------------------------- Wound Assessment Details Patient Name: Date of Service: Candace Robinson, [name] 05/31/2021 1:30 PM Medical Record Number: [id number] Patient Account Number: [id number] Date of Birth/Sex: Treating RN: Dec 14, 1965 (55 y.o. Candace Robinson Primary Care Jenniah Bhavsar: [name] Other Clinician: Referring Dakisha Schoof: Treating Shaymus Eveleth/Extender: [name] Weeks in Treatment: 14 Wound Status Wound Number: 7 Primary Lupus Etiology: Wound Location: Right, Lateral Ankle Wound Open  Wounding Event: Gradually Appeared Status: Date Acquired: 04/08/2021 Comorbid Anemia, Arrhythmia, Congestive Heart Failure, Hypertension, Weeks Of Treatment: 7 History: Type II Diabetes, Lupus Erythematosus Clustered Wound: No Photos Wound Measurements Length: (cm) 1.5 Width: (cm) 1.4 Depth: (cm) 0.2 Area: (cm) 1.649 Volume: (cm) 0.33 % Reduction in Area: -5219.4% % Reduction in Volume: -10900% Epithelialization: Small (1-33%) Tunneling: No Undermining: No Wound Description Classification: Full Thickness Without Exposed Support Structures Wound Margin: Distinct, outline attached Exudate Amount: Medium Exudate Type: Serosanguineous Exudate Color: red, brown Wound Bed Granulation Amount: Medium (34-66%) Granulation Quality: Pink Necrotic Amount: Medium (34-66%) Necrotic Quality: Adherent Slough Foul Odor After Cleansing: No Slough/Fibrino Yes Exposed Structure Fascia Exposed: No Fat Layer (Subcutaneous Tissue) Exposed: Yes Tendon Exposed: No Muscle Exposed: No Joint Exposed: No Bone  Exposed: No Treatment Notes Wound #7 (Ankle) Wound Laterality: Right, Lateral Cleanser Soap and Water Discharge Instruction: May shower and wash wound with dial antibacterial soap and water prior to dressing change. Wound Cleanser Discharge Instruction: Cleanse the wound with wound cleanser prior to applying a clean dressing using gauze sponges, not tissue or cotton balls. Peri-Wound Care Topical Silvadene Cream Discharge Instruction: Apply thin layer to wound bed only Primary Dressing Secondary Dressing ABD Pad, 8x10 Discharge Instruction: Apply over primary dressing as directed. NonWoven Sponge, 4x4 (in/in) Discharge Instruction: Apply over primary dressing as directed Secured With Elastic Bandage 4 inch (ACE bandage) Discharge Instruction: Secure with ACE bandage as directed. Kerlix Roll Sterile, 4.5x3.1 (in/yd) Discharge Instruction: Secure with Kerlix as directed. Transpore Surgical Tape, 2x10 (in/yd) Discharge Instruction: Secure dressing with tape as directed. Compression Wrap Compression Stockings Add-Ons Electronic Signature(s) Signed: 05/31/2021 6:07:33 PM By: Lorrin Jackson Entered By: Lorrin Jackson on 05/31/2021 14:48:23 -------------------------------------------------------------------------------- Vitals Details Patient Name: Date of Service: Candace A [name], [name] 05/31/2021 1:30 PM Medical Record Number: [id number] Patient Account Number: [id number] Date of Birth/Sex: Treating RN: 1965-09-26 (55 y.o. Candace Robinson, Tammi Klippel Primary Care Jannatul Wojdyla: [name] Other Clinician: Referring Larkin Alfred: Treating Sulaiman Imbert/Extender: [name] Weeks in Treatment: 14 Vital Signs Time Taken: 14:23 Temperature (F): 98.5 Height (in): 63 Pulse (bpm): 99 Weight (lbs): 157 Respiratory Rate (breaths/min): 18 Body Mass Index (BMI): 27.8 Blood Pressure (mmHg): 96/65 Reference Range: 80 - 120 mg / dl Electronic Signature(s) Signed:  05/31/2021 6:26:42 PM By: Deon Pilling RN, BSN Entered By: Deon Pilling on 05/31/2021 14:23:34

## 2021-06-14 ENCOUNTER — Encounter (HOSPITAL_BASED_OUTPATIENT_CLINIC_OR_DEPARTMENT_OTHER): Payer: Medicaid Other | Attending: Internal Medicine | Admitting: Internal Medicine

## 2021-06-14 ENCOUNTER — Other Ambulatory Visit: Payer: Self-pay

## 2021-06-14 DIAGNOSIS — L97512 Non-pressure chronic ulcer of other part of right foot with fat layer exposed: Secondary | ICD-10-CM | POA: Insufficient documentation

## 2021-06-14 DIAGNOSIS — I110 Hypertensive heart disease with heart failure: Secondary | ICD-10-CM | POA: Diagnosis not present

## 2021-06-14 DIAGNOSIS — E1151 Type 2 diabetes mellitus with diabetic peripheral angiopathy without gangrene: Secondary | ICD-10-CM | POA: Insufficient documentation

## 2021-06-14 DIAGNOSIS — E11621 Type 2 diabetes mellitus with foot ulcer: Secondary | ICD-10-CM | POA: Insufficient documentation

## 2021-06-14 DIAGNOSIS — L932 Other local lupus erythematosus: Secondary | ICD-10-CM

## 2021-06-14 DIAGNOSIS — I5032 Chronic diastolic (congestive) heart failure: Secondary | ICD-10-CM | POA: Diagnosis not present

## 2021-06-14 DIAGNOSIS — L97522 Non-pressure chronic ulcer of other part of left foot with fat layer exposed: Secondary | ICD-10-CM | POA: Insufficient documentation

## 2021-06-14 NOTE — Progress Notes (Signed)
Candace Robinson, Candace Robinson ([id number]) Visit Report for 06/14/2021 Chief Complaint Document Details Patient Name: Date of Service: Candace A DNA X, [name] 06/14/2021 2:00 PM Medical Record Number: [id number] Patient Account Number: [id number] Date of Birth/Sex: Treating RN: Jul 12, 1965 (55 y.o. [name] Primary Care Provider: [name] Other Clinician: Referring Provider: Treating Provider/Extender: Darcey Nora in Treatment: 16 Information Obtained from: Patient Chief Complaint Bilateral feet wounds Electronic Signature(s) Signed: 06/14/2021 5:11:58 PM By: Kalman Shan DO Entered By: Kalman Shan on 06/14/2021 16:58:42 -------------------------------------------------------------------------------- HPI Details Patient Name: Date of Service: Candace Robinson, [name] 06/14/2021 2:00 PM Medical Record Number: [id number] Patient Account Number: [id number] Date of Birth/Sex: Treating RN: 10/11/1965 (55 y.o. [name] Primary Care Provider: [name] Other Clinician: Referring Provider: Treating Provider/Extender: Darcey Nora in Treatment: 16 History of Present Illness HPI Description: Admission 8/22 Candace Robinson is a 55 year old female with a past medical history of diet-controlled type 2 diabetes, hypothyroidism, chronic diastolic heart failure, cutaneous lupus erythematosus and essential hypertension that presents to the clinic for a 21-month history of worsening bilateral feet ulcers. She states that wounds to her feet have been an ongoing issue for several years. She states that with wound care they improve however they tend to wax and wane. 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 She was discharged to a nursing facility and she was provided wound care for her bilateral feet ulcers at that time. She states they used Silvadene and silver alginate. She states  that when she was discharged in April from the nursing facility her wounds had improved but were not fully closed. She currently uses Silvadene but has not been using silver alginate for several months due to running out. She reports significant decline in her wound healing over the last 4 months. She reports pain to the wound sites. She currently denies increased warmth erythema or purulent drainage. 8/29; patient presents for 1 week follow-up. She states she had ABIs completed without TBI's. She continues to use silver alginate with dressing changes. She reports continued pain. She denies signs of infection. 9/8; patient presents for 1 week follow-up. She was evaluated by Dr. Donzetta Matters and she reports follow-up with him in 1 month. She has been using Santyl to the wound beds. She reports some improvement in wound healing. She denies signs of infection. 9/15; patient presents for follow-up. She continues to use Santyl and silver alginate to the wound beds. She states the alginate is sticking and hard to remove with dressing changes. She currently denies signs of infection. 9/22; patient presents for 1 week follow-up. She is using Santyl and silver alginate to the wound beds. She ran out of [place] and has been using Silvadene. She states that gentamicin ointment is ready at her pharmacy and will be picking this up today. She has no issues or complaints today. She denies signs of infection. 9/30; she is using Santyl and silver alginate. She has substantial wounds on her bilateral feet and bilateral ankles that have been there for a year. She is in a lot of pain. She cannot bear to have these wounds touched let alone a mechanical debridement here. Furthermore the etiology of this is really not totally clear. She had reasonably normal ABIs with triphasic waveforms in August [I did not review this however]. She is completing a course of ciprofloxacin for an area on the left lateral ankle. She has had prior  ablations and follows with vascular surgery. Finally she apparently has lupus 10/6; patient has been using Silvadene to the wound beds with dressing changes. She states she is picking up Santyl today. She has no issues or complaints today. She denies signs of infection. 10/17 the patient managed to get Santyl which the she is using to all wound beds. There is a large areas of breakdown here including substantially on the left distal foot, right foot involving the dorsal toes both medial ankles. All of these wounds are covered with some degree of very adherent fibrinous material. The patient did has an appointment with vascular surgery Dr. Donzetta Matters in 2 days time 10/24; patient presents for follow-up. She has been using Santyl and silver alginate to the wound beds. She states that she is going to be set up with rheumatology soon. She denies signs of infection. She overall reports stability in her wound healing. 11/7; patient presents for follow-up. She continues to use Santyl and silver alginate to the wound beds. She has an appointment set to see rheumatology on 07/06/2021. She reports some improvement in her wound healing. She currently denies signs of infection. 11/14; patient presents for follow-up. She has been using Santyl and silver alginate to the wound beds. She has no issues or complaints today. She denies signs of infection. 11/28; patient presents for follow-up. She has been using silver alginate and Santyl to the wound beds. She did not pick up Dakin's solution. She felt a burning sensation to her feet when it was applied in office. She states she picked up ciprofloxacin 1 week ago. And she is currently taking this. She currently denies signs of infection. 12/12; patient presents for follow-up. States she has been using Silvadene to the wound beds. She has chronic pain. She currently denies signs of infection. Electronic Signature(s) Signed: 06/14/2021 5:11:58 PM By: Kalman Shan  DO Entered By: Kalman Shan on 06/14/2021 16:59:31 -------------------------------------------------------------------------------- Physical Exam Details Patient Name: Date of Service: Candace A DNA X, [name] 06/14/2021 2:00 PM Medical Record Number: [id number] Patient Account Number: [id number] Date of Birth/Sex: Treating RN: 1965-12-23 (55 y.o. [name] Primary Care Provider: [name] Other Clinician: Referring Provider: Treating Provider/Extender: [name] Weeks in Treatment: 16 Constitutional respirations regular, non-labored and within target range for patient.. Cardiovascular 2+ dorsalis pedis/posterior tibialis pulses. Psychiatric pleasant and cooperative. Notes Wounds throughout the feet bilaterally with mostly nonviable tissue and scant granulation tissue present. No obvious signs of surrounding soft tissue infection. Electronic Signature(s) Signed: 06/14/2021 5:11:58 PM By: Kalman Shan DO Entered By: Kalman Shan on 06/14/2021 16:59:57 -------------------------------------------------------------------------------- Physician Orders Details Patient Name: Date of Service: Candace A DNA X, [name] 06/14/2021 2:00 PM Medical Record Number: [id number] Patient Account Number: [id number] Date of Birth/Sex: Treating RN: 1965/11/24 (55 y.o. [name] Primary Care Provider: [name] Other Clinician: Referring Provider: Treating Provider/Extender: Darcey Nora in Treatment: [phone] Verbal / Phone Orders: No Diagnosis Coding ICD-10 Coding Code Description [phone] Non-pressure chronic ulcer of other part of right foot with fat layer exposed L97.522 Non-pressure chronic ulcer of other part of left foot with fat layer exposed I73.9 Peripheral vascular disease, unspecified E11.9 Type 2 diabetes mellitus without complications K27.0 Other local lupus erythematosus W23.76 Chronic diastolic  (congestive) heart failure I10 Essential (primary) hypertension E11.621 Type 2 diabetes mellitus with foot ulcer Follow-up Appointments ppointment in 1 week. - with Dr. Heber Edgewood *****EXTRA TIME - 31 MINUTES***** Return A Other: - [facility]=Supplies  Bathing/ Shower/ Hygiene May shower and wash wound with soap and water. - when dressings changed Edema Control - Lymphedema / SCD / Other Elevate legs to the level of the heart or above for 30 minutes daily and/or when sitting, a frequency of: - throughout the day Avoid standing for long periods of time. Additional Orders / Instructions Follow Nutritious Diet - 100-120g of protein daily Wound Treatment Wound #1 - Ankle Wound Laterality: Right, Medial Cleanser: Soap and Water Other:2 times per day/30 Days Discharge Instructions: May shower and wash wound with dial antibacterial soap and water prior to dressing change. Cleanser: Wound Cleanser Other:2 times per day/30 Days Discharge Instructions: Cleanse the wound with wound cleanser prior to applying a clean dressing using gauze sponges, not tissue or cotton balls. Topical: Dakin's Solution Other:2 times per day/30 Days Discharge Instructions: Apply Dakins moistened gauze to wounds Secondary Dressing: ABD Pad, 8x10 (Generic) Other:2 times per day/30 Days Discharge Instructions: Apply over primary dressing as directed. Secondary Dressing: NonWoven Sponge, 4x4 (in/in) (Generic) Other:2 times per day/30 Days Discharge Instructions: Apply over primary dressing as directed Secured With: Kerlix Roll Sterile, 4.5x3.1 (in/yd) Other:2 times per day/30 Days Discharge Instructions: Secure with Kerlix as directed. Secured With: Transpore Surgical Tape, 2x10 (in/yd) Other:2 times per day/30 Days Discharge Instructions: Secure dressing with tape as directed. Wound #2 - Foot Wound Laterality: Dorsal, Right Cleanser: Soap and Water Other:2 times per day/30 Days Discharge Instructions: May shower and wash  wound with dial antibacterial soap and water prior to dressing change. Cleanser: Wound Cleanser Other:2 times per day/30 Days Discharge Instructions: Cleanse the wound with wound cleanser prior to applying a clean dressing using gauze sponges, not tissue or cotton balls. Topical: Dakin's Solution Other:2 times per day/30 Days Discharge Instructions: Apply Dakins moistened gauze to wounds Secondary Dressing: ABD Pad, 8x10 (Generic) Other:2 times per day/30 Days Discharge Instructions: Apply over primary dressing as directed. Secondary Dressing: NonWoven Sponge, 4x4 (in/in) (Generic) Other:2 times per day/30 Days Discharge Instructions: Apply over primary dressing as directed Secured With: Kerlix Roll Sterile, 4.5x3.1 (in/yd) Other:2 times per day/30 Days Discharge Instructions: Secure with Kerlix as directed. Secured With: Transpore Surgical Tape, 2x10 (in/yd) Other:2 times per day/30 Days Discharge Instructions: Secure dressing with tape as directed. Wound #3 - Ankle Wound Laterality: Left, Medial Cleanser: Soap and Water Other:2 times per day/30 Days Discharge Instructions: May shower and wash wound with dial antibacterial soap and water prior to dressing change. Cleanser: Wound Cleanser Other:2 times per day/30 Days Discharge Instructions: Cleanse the wound with wound cleanser prior to applying a clean dressing using gauze sponges, not tissue or cotton balls. Topical: Dakin's Solution Other:2 times per day/30 Days Discharge Instructions: Apply Dakins moistened gauze to wounds Secondary Dressing: ABD Pad, 8x10 (Generic) Other:2 times per day/30 Days Discharge Instructions: Apply over primary dressing as directed. Secondary Dressing: NonWoven Sponge, 4x4 (in/in) (Generic) Other:2 times per day/30 Days Discharge Instructions: Apply over primary dressing as directed Secured With: Kerlix Roll Sterile, 4.5x3.1 (in/yd) Other:2 times per day/30 Days Discharge Instructions: Secure with Kerlix as  directed. Secured With: Transpore Surgical Tape, 2x10 (in/yd) Other:2 times per day/30 Days Discharge Instructions: Secure dressing with tape as directed. Wound #4 - Ankle Wound Laterality: Left, Lateral Cleanser: Soap and Water Other:2 times per day/30 Days Discharge Instructions: May shower and wash wound with dial antibacterial soap and water prior to dressing change. Cleanser: Wound Cleanser Other:2 times per day/30 Days Discharge Instructions: Cleanse the wound with wound cleanser prior to applying a clean  dressing using gauze sponges, not tissue or cotton balls. Topical: Dakin's Solution Other:2 times per day/30 Days Discharge Instructions: Apply Dakins moistened gauze to wounds Secondary Dressing: ABD Pad, 8x10 (Generic) Other:2 times per day/30 Days Discharge Instructions: Apply over primary dressing as directed. Secondary Dressing: NonWoven Sponge, 4x4 (in/in) (Generic) Other:2 times per day/30 Days Discharge Instructions: Apply over primary dressing as directed Secured With: Kerlix Roll Sterile, 4.5x3.1 (in/yd) Other:2 times per day/30 Days Discharge Instructions: Secure with Kerlix as directed. Secured With: Transpore Surgical Tape, 2x10 (in/yd) Other:2 times per day/30 Days Discharge Instructions: Secure dressing with tape as directed. Wound #5 - Foot Wound Laterality: Dorsal, Left Cleanser: Soap and Water Other:2 times per day/30 Days Discharge Instructions: May shower and wash wound with dial antibacterial soap and water prior to dressing change. Cleanser: Wound Cleanser Other:2 times per day/30 Days Discharge Instructions: Cleanse the wound with wound cleanser prior to applying a clean dressing using gauze sponges, not tissue or cotton balls. Topical: Dakin's Solution Other:2 times per day/30 Days Discharge Instructions: Apply Dakins moistened gauze to wounds Secondary Dressing: ABD Pad, 8x10 (Generic) Other:2 times per day/30 Days Discharge Instructions: Apply over primary  dressing as directed. Secondary Dressing: NonWoven Sponge, 4x4 (in/in) (Generic) Other:2 times per day/30 Days Discharge Instructions: Apply over primary dressing as directed Secured With: Kerlix Roll Sterile, 4.5x3.1 (in/yd) Other:2 times per day/30 Days Discharge Instructions: Secure with Kerlix as directed. Secured With: Transpore Surgical Tape, 2x10 (in/yd) Other:2 times per day/30 Days Discharge Instructions: Secure dressing with tape as directed. Wound #6 - Foot Wound Laterality: Left, Medial, Distal Cleanser: Soap and Water Other:2 times per day/30 Days Discharge Instructions: May shower and wash wound with dial antibacterial soap and water prior to dressing change. Cleanser: Wound Cleanser Other:2 times per day/30 Days Discharge Instructions: Cleanse the wound with wound cleanser prior to applying a clean dressing using gauze sponges, not tissue or cotton balls. Topical: Dakin's Solution Other:2 times per day/30 Days Discharge Instructions: Apply Dakins moistened gauze to wounds Secondary Dressing: ABD Pad, 8x10 (Generic) Other:2 times per day/30 Days Discharge Instructions: Apply over primary dressing as directed. Secondary Dressing: NonWoven Sponge, 4x4 (in/in) (Generic) Other:2 times per day/30 Days Discharge Instructions: Apply over primary dressing as directed Secured With: Kerlix Roll Sterile, 4.5x3.1 (in/yd) Other:2 times per day/30 Days Discharge Instructions: Secure with Kerlix as directed. Secured With: Transpore Surgical Tape, 2x10 (in/yd) Other:2 times per day/30 Days Discharge Instructions: Secure dressing with tape as directed. Wound #7 - Ankle Wound Laterality: Right, Lateral Cleanser: Soap and Water Other:2 times per day/30 Days Discharge Instructions: May shower and wash wound with dial antibacterial soap and water prior to dressing change. Cleanser: Wound Cleanser Other:2 times per day/30 Days Discharge Instructions: Cleanse the wound with wound cleanser prior to  applying a clean dressing using gauze sponges, not tissue or cotton balls. Topical: Dakin's Solution Other:2 times per day/30 Days Discharge Instructions: Apply Dakins moistened gauze to wounds Secondary Dressing: ABD Pad, 8x10 (Generic) Other:2 times per day/30 Days Discharge Instructions: Apply over primary dressing as directed. Secondary Dressing: NonWoven Sponge, 4x4 (in/in) (Generic) Other:2 times per day/30 Days Discharge Instructions: Apply over primary dressing as directed Secured With: Kerlix Roll Sterile, 4.5x3.1 (in/yd) Other:2 times per day/30 Days Discharge Instructions: Secure with Kerlix as directed. Secured With: Transpore Surgical Tape, 2x10 (in/yd) Other:2 times per day/30 Days Discharge Instructions: Secure dressing with tape as directed. Patient Medications llergies: Motrin A Notifications Medication Indication Start End 06/14/2021 Dakin's Solution DOSE 1 - miscellaneous 0.125 % solution -  use with wet to dry dressings twice daily Electronic Signature(s) Signed: 06/14/2021 5:11:58 PM By: Kalman Shan DO Previous Signature: 06/14/2021 5:01:07 PM Version By: Kalman Shan DO Entered By: Kalman Shan on 06/14/2021 17:01:52 -------------------------------------------------------------------------------- Problem List Details Patient Name: Date of Service: Candace A [name], [name] 06/14/2021 2:00 PM Medical Record Number: [id number] Patient Account Number: [id number] Date of Birth/Sex: Treating RN: 08-21-1965 (55 y.o. [name] Primary Care Provider: [name] Other Clinician: Referring Provider: Treating Provider/Extender: Darcey Nora in Treatment: 16 Active Problems ICD-10 Encounter Code Description Active Date MDM Diagnosis [phone] Non-pressure chronic ulcer of other part of right foot with fat layer exposed 02/22/2021 No Yes L97.522 Non-pressure chronic ulcer of other part of left foot with fat layer  exposed 02/22/2021 No Yes I73.9 Peripheral vascular disease, unspecified 02/22/2021 No Yes E11.9 Type 2 diabetes mellitus without complications 6/38/7564 No Yes L93.2 Other local lupus erythematosus 02/22/2021 No Yes P32.95 Chronic diastolic (congestive) heart failure 02/22/2021 No Yes I10 Essential (primary) hypertension 02/22/2021 No Yes E11.621 Type 2 diabetes mellitus with foot ulcer 02/22/2021 No Yes Inactive Problems Resolved Problems Electronic Signature(s) Signed: 06/14/2021 5:11:58 PM By: Kalman Shan DO Entered By: Kalman Shan on 06/14/2021 16:57:08 -------------------------------------------------------------------------------- Progress Note Details Patient Name: Date of Service: Candace A [name], [name] 06/14/2021 2:00 PM Medical Record Number: [id number] Patient Account Number: [id number] Date of Birth/Sex: Treating RN: 25-May-1966 (55 y.o. [name] Primary Care Provider: [name] Other Clinician: Referring Provider: Treating Provider/Extender: Darcey Nora in Treatment: 16 Subjective Chief Complaint Information obtained from Patient Bilateral feet wounds History of Present Illness (HPI) Admission 8/22 Ms. Taheera Thomann is a 55 year old female with a past medical history of diet-controlled type 2 diabetes, hypothyroidism, chronic diastolic heart failure, cutaneous lupus erythematosus and essential hypertension that presents to the clinic for a 96-month history of worsening bilateral feet ulcers. She states that wounds to her feet have been an ongoing issue for several years. She states that with wound care they improve however they tend to wax and wane. 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 She was discharged to a nursing facility and she was provided wound care for her bilateral feet ulcers at that time. She states they used Silvadene and silver alginate. She states that when she was  discharged in April from the nursing facility her wounds had improved but were not fully closed. She currently uses Silvadene but has not been using silver alginate for several months due to running out. She reports significant decline in her wound healing over the last 4 months. She reports pain to the wound sites. She currently denies increased warmth erythema or purulent drainage. 8/29; patient presents for 1 week follow-up. She states she had ABIs completed without TBI's. She continues to use silver alginate with dressing changes. She reports continued pain. She denies signs of infection. 9/8; patient presents for 1 week follow-up. She was evaluated by Dr. Donzetta Matters and she reports follow-up with him in 1 month. She has been using Santyl to the wound beds. She reports some improvement in wound healing. She denies signs of infection. 9/15; patient presents for follow-up. She continues to use Santyl and silver alginate to the wound beds. She states the alginate is sticking and hard to remove with dressing changes. She currently denies signs of infection. 9/22; patient presents for 1 week follow-up. She is using Santyl and silver alginate to the wound beds. She  ran out of Santyl and has been using Silvadene. She states that gentamicin ointment is ready at her pharmacy and will be picking this up today. She has no issues or complaints today. She denies signs of infection. 9/30; she is using Santyl and silver alginate. She has substantial wounds on her bilateral feet and bilateral ankles that have been there for a year. She is in a lot of pain. She cannot bear to have these wounds touched let alone a mechanical debridement here. Furthermore the etiology of this is really not totally clear. She had reasonably normal ABIs with triphasic waveforms in August [I did not review this however]. She is completing a course of ciprofloxacin for an area on the left lateral ankle. She has had prior ablations and  follows with vascular surgery. Finally she apparently has lupus 10/6; patient has been using Silvadene to the wound beds with dressing changes. She states she is picking up Santyl today. She has no issues or complaints today. She denies signs of infection. 10/17 the patient managed to get Santyl which the she is using to all wound beds. There is a large areas of breakdown here including substantially on the left distal foot, right foot involving the dorsal toes both medial ankles. All of these wounds are covered with some degree of very adherent fibrinous material. The patient did has an appointment with vascular surgery Dr. Donzetta Matters in 2 days time 10/24; patient presents for follow-up. She has been using Santyl and silver alginate to the wound beds. She states that she is going to be set up with rheumatology soon. She denies signs of infection. She overall reports stability in her wound healing. 11/7; patient presents for follow-up. She continues to use Santyl and silver alginate to the wound beds. She has an appointment set to see rheumatology on 07/06/2021. She reports some improvement in her wound healing. She currently denies signs of infection. 11/14; patient presents for follow-up. She has been using Santyl and silver alginate to the wound beds. She has no issues or complaints today. She denies signs of infection. 11/28; patient presents for follow-up. She has been using silver alginate and Santyl to the wound beds. She did not pick up Dakin's solution. She felt a burning sensation to her feet when it was applied in office. She states she picked up ciprofloxacin 1 week ago. And she is currently taking this. She currently denies signs of infection. 12/12; patient presents for follow-up. States she has been using Silvadene to the wound beds. She has chronic pain. She currently denies signs of infection. Patient History Information obtained from Patient. Family History Unknown History. Social  History Never smoker, Marital Status - Single, Alcohol Use - Never, Drug Use - No History, Caffeine Use - Rarely. Medical History Hematologic/Lymphatic Patient has history of Anemia Cardiovascular Patient has history of Arrhythmia - A-Fib, Congestive Heart Failure, Hypertension Denies history of Angina Endocrine Patient has history of Type II Diabetes Immunological Patient has history of Lupus Erythematosus Medical A Surgical History Notes nd Endocrine Grave's disease Objective Constitutional respirations regular, non-labored and within target range for patient.. Vitals Time Taken: 2:39 PM, Height: 63 in, Weight: 157 lbs, BMI: 27.8, Temperature: 98.2 F, Pulse: 111 bpm, Respiratory Rate: 18 breaths/min, Blood Pressure: 130/75 mmHg. Cardiovascular 2+ dorsalis pedis/posterior tibialis pulses. Psychiatric pleasant and cooperative. General Notes: Wounds throughout the feet bilaterally with mostly nonviable tissue and scant granulation tissue present. No obvious signs of surrounding soft tissue infection. Integumentary (Hair, Skin) Wound #1 status is  Open. Original cause of wound was Gradually Appeared. The date acquired was: 04/03/2020. The wound has been in treatment 16 weeks. The wound is located on the Right,Medial Ankle. The wound measures 6cm length x 7cm width x 0.3cm depth; 32.987cm^2 area and 9.896cm^3 volume. There is Fat Layer (Subcutaneous Tissue) exposed. There is no tunneling or undermining noted. There is a medium amount of purulent drainage noted. Foul odor after cleansing was noted. The wound margin is distinct with the outline attached to the wound base. There is small (1-33%) granulation within the wound bed. There is a large (67-100%) amount of necrotic tissue within the wound bed including Eschar and Adherent Slough. Wound #2 status is Open. Original cause of wound was Gradually Appeared. The date acquired was: 04/03/2020. The wound has been in treatment 16 weeks. The  wound is located on the Right,Dorsal Foot. The wound measures 6.5cm length x 6cm width x 0.2cm depth; 30.631cm^2 area and 6.126cm^3 volume. There is Fat Layer (Subcutaneous Tissue) exposed. There is no tunneling or undermining noted. There is a medium amount of purulent drainage noted. Foul odor after cleansing was noted. The wound margin is distinct with the outline attached to the wound base. There is small (1-33%) red granulation within the wound bed. There is a large (67-100%) amount of necrotic tissue within the wound bed including Eschar and Adherent Slough. Wound #3 status is Open. Original cause of wound was Gradually Appeared. The date acquired was: 02/22/2021. The wound has been in treatment 16 weeks. The wound is located on the Left,Medial Ankle. The wound measures 1cm length x 1.7cm width x 0.3cm depth; 1.335cm^2 area and 0.401cm^3 volume. There is Fat Layer (Subcutaneous Tissue) exposed. There is no tunneling or undermining noted. There is a medium amount of serosanguineous drainage noted. The wound margin is distinct with the outline attached to the wound base. There is medium (34-66%) red granulation within the wound bed. There is a medium (34- 66%) amount of necrotic tissue within the wound bed including Adherent Slough. Wound #4 status is Open. Original cause of wound was Gradually Appeared. The date acquired was: 04/03/2020. The wound has been in treatment 16 weeks. The wound is located on the Left,Lateral Ankle. The wound measures 7cm length x 5.5cm width x 0.3cm depth; 30.238cm^2 area and 9.071cm^3 volume. There is Fat Layer (Subcutaneous Tissue) exposed. There is no tunneling or undermining noted. There is a medium amount of purulent drainage noted. The wound margin is distinct with the outline attached to the wound base. There is small (1-33%) red granulation within the wound bed. There is a large (67-100%) amount of necrotic tissue within the wound bed including Eschar and Adherent  Slough. Wound #5 status is Open. Original cause of wound was Gradually Appeared. The date acquired was: 04/03/2020. The wound has been in treatment 16 weeks. The wound is located on the Left,Dorsal Foot. The wound measures 10cm length x 8.5cm width x 0.3cm depth; 66.759cm^2 area and 20.028cm^3 volume. There is Fat Layer (Subcutaneous Tissue) exposed. There is a medium amount of purulent drainage noted. Foul odor after cleansing was noted. The wound margin is distinct with the outline attached to the wound base. There is no granulation within the wound bed. There is a large (67-100%) amount of necrotic tissue within the wound bed including Eschar and Adherent Slough. Wound #6 status is Open. Original cause of wound was Gradually Appeared. The date acquired was: 04/03/2020. The wound has been in treatment 16 weeks. The wound is located on  the Left,Distal,Medial Foot. The wound measures 1.2cm length x 1.2cm width x 0.2cm depth; 1.131cm^2 area and 0.226cm^3 volume. There is Fat Layer (Subcutaneous Tissue) exposed. There is no tunneling or undermining noted. There is a small amount of serosanguineous drainage noted. The wound margin is distinct with the outline attached to the wound base. There is medium (34-66%) red granulation within the wound bed. There is a medium (34-66%) amount of necrotic tissue within the wound bed including Adherent Slough. Wound #7 status is Open. Original cause of wound was Gradually Appeared. The date acquired was: 04/08/2021. The wound has been in treatment 9 weeks. The wound is located on the Right,Lateral Ankle. The wound measures 1.2cm length x 1.7cm width x 0.2cm depth; 1.602cm^2 area and 0.32cm^3 volume. There is Fat Layer (Subcutaneous Tissue) exposed. There is no tunneling or undermining noted. There is a medium amount of serosanguineous drainage noted. The wound margin is distinct with the outline attached to the wound base. There is medium (34-66%) pink granulation within  the wound bed. There is a medium (34- 66%) amount of necrotic tissue within the wound bed including Adherent Slough. Assessment Active Problems ICD-10 Non-pressure chronic ulcer of other part of right foot with fat layer exposed Non-pressure chronic ulcer of other part of left foot with fat layer exposed Peripheral vascular disease, unspecified Type 2 diabetes mellitus without complications Other local lupus erythematosus Chronic diastolic (congestive) heart failure Essential (primary) hypertension Type 2 diabetes mellitus with foot ulcer Patient's wounds are stable. Based on appearance I did recommend she use Dakin's solution to see if this will help with removing the necrotic tissue and odor. Patient cannot tolerate debridement. She has not decided on surgery at this time. I did receive her results from previous work blood work Done recently that showed a positive ANA with positive RNP antibody and sed rate of 98 and C-reactive protein of 43. Patient has a rheumatology appointment next month. Patient has a history of lupus not currently on medication. The wounds are atypical and likely a result from her uncontrolled lupus. Plan Follow-up Appointments: Return Appointment in 1 week. - with Dr. Heber [place] *****EXTRA TIME - 82 MINUTES***** Other: - [facility]=Supplies Bathing/ Shower/ Hygiene: May shower and wash wound with soap and water. - when dressings changed Edema Control - Lymphedema / SCD / Other: Elevate legs to the level of the heart or above for 30 minutes daily and/or when sitting, a frequency of: - throughout the day Avoid standing for long periods of time. Additional Orders / Instructions: Follow Nutritious Diet - 100-120g of protein daily The following medication(s) was prescribed: Dakin's Solution miscellaneous 0.125 % solution 1 use with wet to dry dressings twice daily starting 06/14/2021 WOUND #1: - Ankle Wound Laterality: Right, Medial Cleanser: Soap and Water Other:2  times per day/30 Days Discharge Instructions: May shower and wash wound with dial antibacterial soap and water prior to dressing change. Cleanser: Wound Cleanser Other:2 times per day/30 Days Discharge Instructions: Cleanse the wound with wound cleanser prior to applying a clean dressing using gauze sponges, not tissue or cotton balls. Topical: Dakin's Solution Other:2 times per day/30 Days Discharge Instructions: Apply Dakins moistened gauze to wounds Secondary Dressing: ABD Pad, 8x10 (Generic) Other:2 times per day/30 Days Discharge Instructions: Apply over primary dressing as directed. Secondary Dressing: NonWoven Sponge, 4x4 (in/in) (Generic) Other:2 times per day/30 Days Discharge Instructions: Apply over primary dressing as directed Secured With: Kerlix Roll Sterile, 4.5x3.1 (in/yd) Other:2 times per day/30 Days Discharge Instructions: Secure with Kerlix  as directed. Secured With: Transpore Surgical T ape, 2x10 (in/yd) Other:2 times per day/30 Days Discharge Instructions: Secure dressing with tape as directed. WOUND #2: - Foot Wound Laterality: Dorsal, Right Cleanser: Soap and Water Other:2 times per day/30 Days Discharge Instructions: May shower and wash wound with dial antibacterial soap and water prior to dressing change. Cleanser: Wound Cleanser Other:2 times per day/30 Days Discharge Instructions: Cleanse the wound with wound cleanser prior to applying a clean dressing using gauze sponges, not tissue or cotton balls. Topical: Dakin's Solution Other:2 times per day/30 Days Discharge Instructions: Apply Dakins moistened gauze to wounds Secondary Dressing: ABD Pad, 8x10 (Generic) Other:2 times per day/30 Days Discharge Instructions: Apply over primary dressing as directed. Secondary Dressing: NonWoven Sponge, 4x4 (in/in) (Generic) Other:2 times per day/30 Days Discharge Instructions: Apply over primary dressing as directed Secured With: Kerlix Roll Sterile, 4.5x3.1 (in/yd) Other:2  times per day/30 Days Discharge Instructions: Secure with Kerlix as directed. Secured With: Transpore Surgical T ape, 2x10 (in/yd) Other:2 times per day/30 Days Discharge Instructions: Secure dressing with tape as directed. WOUND #3: - Ankle Wound Laterality: Left, Medial Cleanser: Soap and Water Other:2 times per day/30 Days Discharge Instructions: May shower and wash wound with dial antibacterial soap and water prior to dressing change. Cleanser: Wound Cleanser Other:2 times per day/30 Days Discharge Instructions: Cleanse the wound with wound cleanser prior to applying a clean dressing using gauze sponges, not tissue or cotton balls. Topical: Dakin's Solution Other:2 times per day/30 Days Discharge Instructions: Apply Dakins moistened gauze to wounds Secondary Dressing: ABD Pad, 8x10 (Generic) Other:2 times per day/30 Days Discharge Instructions: Apply over primary dressing as directed. Secondary Dressing: NonWoven Sponge, 4x4 (in/in) (Generic) Other:2 times per day/30 Days Discharge Instructions: Apply over primary dressing as directed Secured With: Kerlix Roll Sterile, 4.5x3.1 (in/yd) Other:2 times per day/30 Days Discharge Instructions: Secure with Kerlix as directed. Secured With: Transpore Surgical T ape, 2x10 (in/yd) Other:2 times per day/30 Days Discharge Instructions: Secure dressing with tape as directed. WOUND #4: - Ankle Wound Laterality: Left, Lateral Cleanser: Soap and Water Other:2 times per day/30 Days Discharge Instructions: May shower and wash wound with dial antibacterial soap and water prior to dressing change. Cleanser: Wound Cleanser Other:2 times per day/30 Days Discharge Instructions: Cleanse the wound with wound cleanser prior to applying a clean dressing using gauze sponges, not tissue or cotton balls. Topical: Dakin's Solution Other:2 times per day/30 Days Discharge Instructions: Apply Dakins moistened gauze to wounds Secondary Dressing: ABD Pad, 8x10 (Generic)  Other:2 times per day/30 Days Discharge Instructions: Apply over primary dressing as directed. Secondary Dressing: NonWoven Sponge, 4x4 (in/in) (Generic) Other:2 times per day/30 Days Discharge Instructions: Apply over primary dressing as directed Secured With: Kerlix Roll Sterile, 4.5x3.1 (in/yd) Other:2 times per day/30 Days Discharge Instructions: Secure with Kerlix as directed. Secured With: Transpore Surgical T ape, 2x10 (in/yd) Other:2 times per day/30 Days Discharge Instructions: Secure dressing with tape as directed. WOUND #5: - Foot Wound Laterality: Dorsal, Left Cleanser: Soap and Water Other:2 times per day/30 Days Discharge Instructions: May shower and wash wound with dial antibacterial soap and water prior to dressing change. Cleanser: Wound Cleanser Other:2 times per day/30 Days Discharge Instructions: Cleanse the wound with wound cleanser prior to applying a clean dressing using gauze sponges, not tissue or cotton balls. Topical: Dakin's Solution Other:2 times per day/30 Days Discharge Instructions: Apply Dakins moistened gauze to wounds Secondary Dressing: ABD Pad, 8x10 (Generic) Other:2 times per day/30 Days Discharge Instructions: Apply over primary dressing as  directed. Secondary Dressing: NonWoven Sponge, 4x4 (in/in) (Generic) Other:2 times per day/30 Days Discharge Instructions: Apply over primary dressing as directed Secured With: Kerlix Roll Sterile, 4.5x3.1 (in/yd) Other:2 times per day/30 Days Discharge Instructions: Secure with Kerlix as directed. Secured With: Transpore Surgical T ape, 2x10 (in/yd) Other:2 times per day/30 Days Discharge Instructions: Secure dressing with tape as directed. WOUND #6: - Foot Wound Laterality: Left, Medial, Distal Cleanser: Soap and Water Other:2 times per day/30 Days Discharge Instructions: May shower and wash wound with dial antibacterial soap and water prior to dressing change. Cleanser: Wound Cleanser Other:2 times per day/30  Days Discharge Instructions: Cleanse the wound with wound cleanser prior to applying a clean dressing using gauze sponges, not tissue or cotton balls. Topical: Dakin's Solution Other:2 times per day/30 Days Discharge Instructions: Apply Dakins moistened gauze to wounds Secondary Dressing: ABD Pad, 8x10 (Generic) Other:2 times per day/30 Days Discharge Instructions: Apply over primary dressing as directed. Secondary Dressing: NonWoven Sponge, 4x4 (in/in) (Generic) Other:2 times per day/30 Days Discharge Instructions: Apply over primary dressing as directed Secured With: Kerlix Roll Sterile, 4.5x3.1 (in/yd) Other:2 times per day/30 Days Discharge Instructions: Secure with Kerlix as directed. Secured With: Transpore Surgical T ape, 2x10 (in/yd) Other:2 times per day/30 Days Discharge Instructions: Secure dressing with tape as directed. WOUND #7: - Ankle Wound Laterality: Right, Lateral Cleanser: Soap and Water Other:2 times per day/30 Days Discharge Instructions: May shower and wash wound with dial antibacterial soap and water prior to dressing change. Cleanser: Wound Cleanser Other:2 times per day/30 Days Discharge Instructions: Cleanse the wound with wound cleanser prior to applying a clean dressing using gauze sponges, not tissue or cotton balls. Topical: Dakin's Solution Other:2 times per day/30 Days Discharge Instructions: Apply Dakins moistened gauze to wounds Secondary Dressing: ABD Pad, 8x10 (Generic) Other:2 times per day/30 Days Discharge Instructions: Apply over primary dressing as directed. Secondary Dressing: NonWoven Sponge, 4x4 (in/in) (Generic) Other:2 times per day/30 Days Discharge Instructions: Apply over primary dressing as directed Secured With: Kerlix Roll Sterile, 4.5x3.1 (in/yd) Other:2 times per day/30 Days Discharge Instructions: Secure with Kerlix as directed. Secured With: Transpore Surgical T ape, 2x10 (in/yd) Other:2 times per day/30 Days Discharge Instructions:  Secure dressing with tape as directed. 1. Dakin's wet-to-dry dressings 2. Follow-up in 1 week Electronic Signature(s) Signed: 06/14/2021 5:11:58 PM By: Kalman Shan DO Entered By: Kalman Shan on 06/14/2021 17:10:53 -------------------------------------------------------------------------------- HxROS Details Patient Name: Date of Service: Candace A [name], [name] 06/14/2021 2:00 PM Medical Record Number: [id number] Patient Account Number: [id number] Date of Birth/Sex: Treating RN: Nov 08, 1965 (55 y.o. [name] Primary Care Provider: [name] Other Clinician: Referring Provider: Treating Provider/Extender: Darcey Nora in Treatment: 16 Information Obtained From Patient Hematologic/Lymphatic Medical History: Positive for: Anemia Cardiovascular Medical History: Positive for: Arrhythmia - A-Fib; Congestive Heart Failure; Hypertension Negative for: Angina Endocrine Medical History: Positive for: Type II Diabetes Past Medical History Notes: Grave's disease Time with diabetes: over 10 years Treated with: Diet Blood sugar tested every day: No Immunological Medical History: Positive for: Lupus Erythematosus Immunizations Pneumococcal Vaccine: Received Pneumococcal Vaccination: No Implantable Devices None Family and Social History Unknown History: Yes; Never smoker; Marital Status - Single; Alcohol Use: Never; Drug Use: No History; Caffeine Use: Rarely; Financial Concerns: No; Food, Clothing or Shelter Needs: No; Support System Lacking: No; Transportation Concerns: No Electronic Signature(s) Signed: 06/14/2021 5:11:58 PM By: Kalman Shan DO Signed: 06/14/2021 5:29:47 PM By: Lorrin Jackson Entered By: Kalman Shan on 06/14/2021 16:59:37 --------------------------------------------------------------------------------  SuperBill Details Patient Name: Date of Service: Candace A DNA X, [name] 06/14/2021 Medical  Record Number: [id number] Patient Account Number: [id number] Date of Birth/Sex: Treating RN: 1966/05/12 (54 y.o. [name] Primary Care Provider: [name] Other Clinician: Referring Provider: Treating Provider/Extender: Darcey Nora in Treatment: 16 Diagnosis Coding ICD-10 Codes Code Description [phone] Non-pressure chronic ulcer of other part of right foot with fat layer exposed L97.522 Non-pressure chronic ulcer of other part of left foot with fat layer exposed I73.9 Peripheral vascular disease, unspecified E11.9 Type 2 diabetes mellitus without complications S01.0 Other local lupus erythematosus X32.35 Chronic diastolic (congestive) heart failure I10 Essential (primary) hypertension E11.621 Type 2 diabetes mellitus with foot ulcer Facility Procedures CPT4 Code: [id number] Description: 42706 - WOUND CARE VISIT-LEV 5 EST PT Modifier: Quantity: 1 Physician Procedures : CPT4 Code Description Modifier [id number] - WC PHYS LEVEL 3 - EST PT ICD-10 Diagnosis Description L97.512 Non-pressure chronic ulcer of other part of right foot with fat layer exposed L97.522 Non-pressure chronic ulcer of other part of left foot  with fat layer exposed L93.2 Other local lupus erythematosus E11.621 Type 2 diabetes mellitus with foot ulcer Quantity: 1 Electronic Signature(s) Signed: 06/14/2021 5:11:58 PM By: Kalman Shan DO Entered By: Kalman Shan on 06/14/2021 17:11:15

## 2021-06-14 NOTE — Progress Notes (Signed)
RYNN, MARKIEWICZ ([id number]) Visit Report for 06/14/2021 Arrival Information Details Patient Name: Date of Service: Candace Robinson, [name] 06/14/2021 2:00 PM Medical Record Number: [id number] Patient Account Number: [id number] Date of Birth/Sex: Treating RN: 10/03/1965 (55 y.o. Candace Robinson Primary Care Candace Robinson: Candace Robinson Other Clinician: Referring Candace Robinson: Treating Candace Robinson/Extender: Candace Robinson in Treatment: 57 Visit Information History Since Last Visit Added or deleted any medications: No Patient Arrived: Candace Robinson Any new allergies or adverse reactions: No Arrival Time: 14:33 Had a fall or experienced change in No Transfer Assistance: None activities of daily living that may affect Patient Identification Verified: Yes risk of falls: Secondary Verification Process Completed: Yes Signs or symptoms of abuse/neglect since last visito No Patient Requires Transmission-Based No Hospitalized since last visit: No Precautions: Implantable device outside of the clinic excluding No Patient Has Alerts: Yes cellular tissue based products placed in the center Patient Alerts: R ABI: 1.0 L ABI: 1.1 since last visit: NO BENZOCAINE Has Dressing in Place as Prescribed: Yes USE LIDOCAINE GEL Pain Present Now: Yes ONLY Electronic Signature(s) Signed: 06/14/2021 5:29:47 PM By: Candace Robinson Entered By: Candace Robinson on 06/14/2021 14:33:47 -------------------------------------------------------------------------------- Clinic Level of Care Assessment Details Patient Name: Date of Service: Candace A DNA X, [name] 06/14/2021 2:00 PM Medical Record Number: [id number] Patient Account Number: [id number] Date of Birth/Sex: Treating RN: Aug 28, 1965 (Robinson y.o. Candace Robinson Primary Care Candace Robinson: Candace Robinson Other Clinician: Referring Candace Robinson: Treating Candace Robinson/Extender: Candace Robinson in Treatment: Robinson Clinic Level  of Care Assessment Items TOOL 4 Quantity Score X- 1 0 Use when only an EandM is performed on FOLLOW-UP visit ASSESSMENTS - Nursing Assessment / Reassessment X- 1 10 Reassessment of Co-morbidities (includes updates in patient status) X- 1 5 Reassessment of Adherence to Treatment Plan ASSESSMENTS - Wound and Skin A ssessment / Reassessment []  - 0 Simple Wound Assessment / Reassessment - one wound X- 7 5 Complex Wound Assessment / Reassessment - multiple wounds []  - 0 Dermatologic / Skin Assessment (not related to wound area) ASSESSMENTS - Focused Assessment []  - 0 Circumferential Edema Measurements - multi extremities []  - 0 Nutritional Assessment / Counseling / Intervention []  - 0 Lower Extremity Assessment (monofilament, tuning fork, pulses) []  - 0 Peripheral Arterial Disease Assessment (using hand held doppler) ASSESSMENTS - Ostomy and/or Continence Assessment and Care []  - 0 Incontinence Assessment and Management []  - 0 Ostomy Care Assessment and Management (repouching, etc.) PROCESS - Coordination of Care []  - 0 Simple Patient / Family Education for ongoing care X- 1 20 Complex (extensive) Patient / Family Education for ongoing care []  - 0 Staff obtains Programmer, systems, Records, T Results / Process Orders est []  - 0 Staff telephones HHA, Nursing Homes / Clarify orders / etc []  - 0 Routine Transfer to another Facility (non-emergent condition) []  - 0 Routine Hospital Admission (non-emergent condition) []  - 0 New Admissions / Biomedical engineer / Ordering NPWT Apligraf, etc. , []  - 0 Emergency Hospital Admission (emergent condition) []  - 0 Simple Discharge Coordination []  - 0 Complex (extensive) Discharge Coordination PROCESS - Special Needs []  - 0 Pediatric / Minor Patient Management []  - 0 Isolation Patient Management []  - 0 Hearing / Language / Visual special needs []  - 0 Assessment of Community assistance (transportation, D/C planning, etc.) []  -  0 Additional assistance / Altered mentation []  - 0 Support Surface(s) Assessment (bed, cushion, seat, etc.) INTERVENTIONS - Wound Cleansing / Measurement []  - 0 Simple  Wound Cleansing - one wound X- 7 5 Complex Wound Cleansing - multiple wounds X- 1 5 Wound Imaging (photographs - any number of wounds) []  - 0 Wound Tracing (instead of photographs) []  - 0 Simple Wound Measurement - one wound X- 7 5 Complex Wound Measurement - multiple wounds INTERVENTIONS - Wound Dressings X - Small Wound Dressing one or multiple wounds 3 10 X- 4 15 Medium Wound Dressing one or multiple wounds []  - 0 Large Wound Dressing one or multiple wounds []  - 0 Application of Medications - topical []  - 0 Application of Medications - injection INTERVENTIONS - Miscellaneous []  - 0 External ear exam []  - 0 Specimen Collection (cultures, biopsies, blood, body fluids, etc.) []  - 0 Specimen(s) / Culture(s) sent or taken to Lab for analysis []  - 0 Patient Transfer (multiple staff / Civil Service fast streamer / Similar devices) []  - 0 Simple Staple / Suture removal (25 or less) []  - 0 Complex Staple / Suture removal (26 or more) []  - 0 Hypo / Hyperglycemic Management (close monitor of Blood Glucose) []  - 0 Ankle / Brachial Index (ABI) - do not check if billed separately X- 1 5 Vital Signs Has the patient been seen at the hospital within the last three years: Yes Total Score: 240 Level Of Care: New/Established - Level 5 Electronic Signature(s) Signed: 06/14/2021 5:29:47 PM By: Candace Robinson Entered By: Candace Robinson on 06/14/2021 15:40:07 -------------------------------------------------------------------------------- Encounter Discharge Information Details Patient Name: Date of Service: Candace A [name], [name] 06/14/2021 2:00 PM Medical Record Number: [id number] Patient Account Number: [id number] Date of Birth/Sex: Treating RN: [birth year] (55 y.o. Candace Robinson Primary Care [name]: Candace Robinson Other  Clinician: Referring Maurissa Ambrose: Treating Candace Robinson Encounter Discharge Information Items Discharge Condition: Stable Ambulatory Status: Walker Discharge Destination: Home Transportation: Private Auto Schedule Follow-up Appointment: Yes Clinical Summary of Care: Provided on 06/14/2021 Form Type Recipient Paper Patient Patient Electronic Signature(s) Signed: 06/14/2021 5:29:47 PM By: Candace Robinson Entered By: Candace Robinson on 06/14/2021 15:41:19 -------------------------------------------------------------------------------- Lower Extremity Assessment Details Patient Name: Date of Service: Candace A DNA X, [name] 06/14/2021 2:00 PM Medical Record Number: [id number] Patient Account Number: [id number] Date of Birth/Sex: Treating RN: 02/02/1966 (55 y.o. Candace Robinson Primary Care Sharis Keeran: Candace Robinson Other Clinician: Referring Aerielle Stoklosa: Treating Mishayla Sliwinski/Extender: [name] Weeks in Treatment: Robinson Edema Assessment Assessed: [name]: Yes] Patrice Robinson: Yes] Edema: [Left: Yes] [Right: Yes] Calf Left: Right: Point of Measurement: 29 cm From Medial Instep 35.5 cm 35.8 cm Ankle Left: Right: Point of Measurement: 10 cm From Medial Instep 23.5 cm 24.7 cm Vascular Assessment Pulses: Dorsalis Pedis Palpable: [Left:No] [Right:No] Electronic Signature(s) Signed: 06/14/2021 5:29:47 PM By: Candace Robinson Entered By: Candace Robinson on 06/14/2021 14:45:14 -------------------------------------------------------------------------------- Multi Wound Chart Details Patient Name: Date of Service: Candace Robinson, [name] 06/14/2021 2:00 PM Medical Record Number: [id number] Patient Account Number: [id number] Date of Birth/Sex: Treating RN: 15-Jun-1966 (55 y.o. Candace Robinson Primary Care Cyrena Kuchenbecker: Candace Robinson Other Clinician: Referring Serra Younan: Treating Dollie Bressi/Extender: [name] Weeks in Treatment: Robinson Vital Signs Height(in): 63 Pulse(bpm): 111 Weight(lbs): 157 Blood Pressure(mmHg): 130/75 Body Mass Index(BMI): 28 Temperature(F): 98.2 Respiratory Rate(breaths/min): 18 Photos: [1:No Photos Right, Medial Ankle] [2:No Photos Right, Dorsal Foot] [3:No Photos Left, Medial Ankle] Wound Location: [1:Gradually Appeared] [2:Gradually Appeared] [3:Gradually Appeared] Wounding Event: [1:Diabetic Wound/Ulcer of the Lower] [2:Diabetic Wound/Ulcer of the Lower] [3:Diabetic Wound/Ulcer of the Lower] Primary Etiology: [1:Extremity  Anemia, Arrhythmia, Congestive Heart Anemia, Arrhythmia, Congestive Heart Anemia, Arrhythmia, Congestive Heart] [2:Extremity] [3:Extremity] Comorbid History: [1:Failure, Hypertension, Type II Diabetes, Lupus Erythematosus 04/03/2020] [2:Failure, Hypertension, Type II Diabetes, Lupus Erythematosus 04/03/2020] [3:Failure, Hypertension, Type II Diabetes, Lupus Erythematosus 02/22/2021] Date Acquired: [1:Robinson] [2:Robinson] [3:Robinson] Weeks of Treatment: [1:Open] [2:Open] [3:Open] Wound Status: [1:6x7x0.3] [2:6.5x6x0.2] [3:1x1.7x0.3] Measurements L x W x D (cm) [1:32.987] [2:30.631] [3:1.335] A (cm) : rea [1:9.896] [2:6.126] [3:0.401] Volume (cm) : [1:-100.00%] [2:-160.00%] [3:32.00%] % Reduction in A rea: [1:-200.00%] [2:-420.00%] [3:-2.00%] % Reduction in Volume: [1:Grade 2] [2:Grade 2] [3:Grade 2] Classification: [1:Medium] [2:Medium] [3:Medium] Exudate A mount: [1:Purulent] [2:Purulent] [3:Serosanguineous] Exudate Type: [1:yellow, brown, green] [2:yellow, brown, green] [3:red, brown] Exudate Color: [1:Yes] [2:Yes] [3:No] Foul Odor A Cleansing: [1:fter No] [2:No] [3:N/A] Odor A nticipated Due to Product Use: [1:Distinct, outline attached] [2:Distinct, outline attached] [3:Distinct, outline attached] Wound Margin: [1:Small (1-33%)] [2:Small (1-33%)] [3:Medium (34-66%)] Granulation A mount: [1:N/A] [2:Red] [3:Red] Granulation Quality:  [1:Large (67-100%)] [2:Large (67-100%)] [3:Medium (34-66%)] Necrotic A mount: [1:Eschar, Adherent Slough] [2:Eschar, Adherent Slough] [3:Adherent Slough] Necrotic Tissue: [1:Fat Layer (Subcutaneous Tissue): Yes Fat Layer (Subcutaneous Tissue): Yes Fat Layer (Subcutaneous Tissue): Yes] Exposed Structures: [1:Fascia: No Tendon: No Muscle: No Joint: No Bone: No Small (1-33%)] [2:Fascia: No Tendon: No Muscle: No Joint: No Bone: No Small (1-33%)] [3:Fascia: No Tendon: No Muscle: No Joint: No Bone: No Small (1-33%)] Wound Number: 4 5 6  Photos: No Photos No Photos No Photos Left, Lateral Ankle Left, Dorsal Foot Left, Distal, Medial Foot Wound Location: Gradually Appeared Gradually Appeared Gradually Appeared Wounding Event: Diabetic Wound/Ulcer of the Lower Diabetic Wound/Ulcer of the Lower Diabetic Wound/Ulcer of the Lower Primary Etiology: Extremity Extremity Extremity Anemia, Arrhythmia, Congestive Heart Anemia, Arrhythmia, Congestive Heart Anemia, Arrhythmia, Congestive Heart Comorbid History: Failure, Hypertension, Type II Failure, Hypertension, Type II Failure, Hypertension, Type II Diabetes, Lupus Erythematosus Diabetes, Lupus Erythematosus Diabetes, Lupus Erythematosus 04/03/2020 04/03/2020 04/03/2020 Date Acquired: Robinson Robinson Robinson  Weeks of Treatment: Open Open Open Wound Status: 7x5.5x0.3 10x8.5x0.3 1.2x1.2x0.2 Measurements L x W x D (cm) 30.238 66.759 1.131 A (cm) : rea 9.071 20.028 0.226 Volume (cm) : 19.80% -11.10% -260.20% % Reduction in A rea: -20.30% -233.40% -629.00% % Reduction in Volume: Grade 2 Grade 2 Grade 2 Classification: Medium Medium Small Exudate A mount: Purulent Purulent Serosanguineous Exudate Type: yellow, brown, green yellow, brown, green red, brown Exudate Color: No Yes No Foul Odor A Cleansing: fter N/A No N/A Odor A nticipated Due to Product Use: Distinct, outline attached Distinct, outline attached Distinct, outline attached Wound Margin: Small  (1-33%) None Present (0%) Medium (34-66%) Granulation A mount: Red N/A Red Granulation Quality: Large (67-100%) Large (67-100%) Medium (34-66%) Necrotic A mount: Eschar, Adherent Slough Eschar, Adherent [employer] Necrotic Tissue: Fat Layer (Subcutaneous Tissue): Yes Fat Layer (Subcutaneous Tissue): Yes Fat Layer (Subcutaneous Tissue): Yes Exposed Structures: Fascia: No Fascia: No Fascia: No Tendon: No Tendon: No Tendon: No Muscle: No Muscle: No Muscle: No Joint: No Joint: No Joint: No Bone: No Bone: No Bone: No None Small (1-33%) Medium (34-66%) Epithelialization: Wound Number: 7 N/A N/A Photos: No Photos N/A N/A Right, Lateral Ankle N/A N/A Wound Location: Gradually Appeared N/A N/A Wounding Event: Lupus N/A N/A Primary Etiology: Anemia, Arrhythmia, Congestive Heart N/A N/A Comorbid History: Failure, Hypertension, Type II Diabetes, Lupus Erythematosus 04/08/2021 N/A N/A Date Acquired: 9 N/A N/A Weeks of Treatment: Open N/A N/A Wound Status: 1.2x1.7x0.2 N/A N/A Measurements L x W x D (cm) 1.602 N/A N/A A (cm) : rea 0.32 N/A N/A  Volume (cm) : -5067.70% N/A N/A % Reduction in Area: -10566.70% N/A N/A % Reduction in Volume: Full Thickness Without Exposed N/A N/A Classification: Support Structures Medium N/A N/A Exudate A mount: Serosanguineous N/A N/A Exudate Type: red, brown N/A N/A Exudate Color: No N/A N/A Foul Odor A Cleansing: fter N/A N/A N/A Odor Anticipated Due to Product Use: Distinct, outline attached N/A N/A Wound Margin: Medium (34-66%) N/A N/A Granulation A mount: Pink N/A N/A Granulation Quality: Medium (34-66%) N/A N/A Necrotic Amount: Adherent Slough N/A N/A Necrotic Tissue: Fat Layer (Subcutaneous Tissue): Yes N/A N/A Exposed Structures: Fascia: No Tendon: No Muscle: No Joint: No Bone: No Small (1-33%) N/A N/A Epithelialization: Treatment Notes Wound #1 (Ankle) Wound Laterality: Right,  Medial Cleanser Soap and Water Discharge Instruction: May shower and wash wound with dial antibacterial soap and water prior to dressing change. Wound Cleanser Discharge Instruction: Cleanse the wound with wound cleanser prior to applying a clean dressing using gauze sponges, not tissue or cotton balls. Peri-Wound Care Topical Dakin's Solution Discharge Instruction: Apply Dakins moistened gauze to wounds Primary Dressing Secondary Dressing ABD Pad, 8x10 Discharge Instruction: Apply over primary dressing as directed. NonWoven Sponge, 4x4 (in/in) Discharge Instruction: Apply over primary dressing as directed Secured With Kerlix Roll Sterile, 4.5x3.1 (in/yd) Discharge Instruction: Secure with Kerlix as directed. Transpore Surgical Tape, 2x10 (in/yd) Discharge Instruction: Secure dressing with tape as directed. Compression Wrap Compression Stockings Add-Ons Wound #2 (Foot) Wound Laterality: Dorsal, Right Cleanser Soap and Water Discharge Instruction: May shower and wash wound with dial antibacterial soap and water prior to dressing change. Wound Cleanser Discharge Instruction: Cleanse the wound with wound cleanser prior to applying a clean dressing using gauze sponges, not tissue or cotton balls. Peri-Wound Care Topical Dakin's Solution Discharge Instruction: Apply Dakins moistened gauze to wounds Primary Dressing Secondary Dressing ABD Pad, 8x10 Discharge Instruction: Apply over primary dressing as directed. NonWoven Sponge, 4x4 (in/in) Discharge Instruction: Apply over primary dressing as directed Secured With Kerlix Roll Sterile, 4.5x3.1 (in/yd) Discharge Instruction: Secure with Kerlix as directed. Transpore Surgical Tape, 2x10 (in/yd) Discharge Instruction: Secure dressing with tape as directed. Compression Wrap Compression Stockings Add-Ons Wound #3 (Ankle) Wound Laterality: Left, Medial Cleanser Soap and Water Discharge Instruction: May shower and wash wound with  dial antibacterial soap and water prior to dressing change. Wound Cleanser Discharge Instruction: Cleanse the wound with wound cleanser prior to applying a clean dressing using gauze sponges, not tissue or cotton balls. Peri-Wound Care Topical Dakin's Solution Discharge Instruction: Apply Dakins moistened gauze to wounds Primary Dressing Secondary Dressing ABD Pad, 8x10 Discharge Instruction: Apply over primary dressing as directed. NonWoven Sponge, 4x4 (in/in) Discharge Instruction: Apply over primary dressing as directed Secured With Kerlix Roll Sterile, 4.5x3.1 (in/yd) Discharge Instruction: Secure with Kerlix as directed. Transpore Surgical Tape, 2x10 (in/yd) Discharge Instruction: Secure dressing with tape as directed. Compression Wrap Compression Stockings Add-Ons Wound #4 (Ankle) Wound Laterality: Left, Lateral Cleanser Soap and Water Discharge Instruction: May shower and wash wound with dial antibacterial soap and water prior to dressing change. Wound Cleanser Discharge Instruction: Cleanse the wound with wound cleanser prior to applying a clean dressing using gauze sponges, not tissue or cotton balls. Peri-Wound Care Topical Dakin's Solution Discharge Instruction: Apply Dakins moistened gauze to wounds Primary Dressing Secondary Dressing ABD Pad, 8x10 Discharge Instruction: Apply over primary dressing as directed. NonWoven Sponge, 4x4 (in/in) Discharge Instruction: Apply over primary dressing as directed Secured With Kerlix Roll Sterile, 4.5x3.1 (in/yd) Discharge Instruction: Secure with Kerlix as directed. Transpore Surgical Tape,  2x10 (in/yd) Discharge Instruction: Secure dressing with tape as directed. Compression Wrap Compression Stockings Add-Ons Wound #5 (Foot) Wound Laterality: Dorsal, Left Cleanser Soap and Water Discharge Instruction: May shower and wash wound with dial antibacterial soap and water prior to dressing change. Wound Cleanser Discharge  Instruction: Cleanse the wound with wound cleanser prior to applying a clean dressing using gauze sponges, not tissue or cotton balls. Peri-Wound Care Topical Dakin's Solution Discharge Instruction: Apply Dakins moistened gauze to wounds Primary Dressing Secondary Dressing ABD Pad, 8x10 Discharge Instruction: Apply over primary dressing as directed. NonWoven Sponge, 4x4 (in/in) Discharge Instruction: Apply over primary dressing as directed Secured With Kerlix Roll Sterile, 4.5x3.1 (in/yd) Discharge Instruction: Secure with Kerlix as directed. Transpore Surgical Tape, 2x10 (in/yd) Discharge Instruction: Secure dressing with tape as directed. Compression Wrap Compression Stockings Add-Ons Wound #6 (Foot) Wound Laterality: Left, Medial, Distal Cleanser Soap and Water Discharge Instruction: May shower and wash wound with dial antibacterial soap and water prior to dressing change. Wound Cleanser Discharge Instruction: Cleanse the wound with wound cleanser prior to applying a clean dressing using gauze sponges, not tissue or cotton balls. Peri-Wound Care Topical Dakin's Solution Discharge Instruction: Apply Dakins moistened gauze to wounds Primary Dressing Secondary Dressing ABD Pad, 8x10 Discharge Instruction: Apply over primary dressing as directed. NonWoven Sponge, 4x4 (in/in) Discharge Instruction: Apply over primary dressing as directed Secured With Kerlix Roll Sterile, 4.5x3.1 (in/yd) Discharge Instruction: Secure with Kerlix as directed. Transpore Surgical Tape, 2x10 (in/yd) Discharge Instruction: Secure dressing with tape as directed. Compression Wrap Compression Stockings Add-Ons Wound #7 (Ankle) Wound Laterality: Right, Lateral Cleanser Soap and Water Discharge Instruction: May shower and wash wound with dial antibacterial soap and water prior to dressing change. Wound Cleanser Discharge Instruction: Cleanse the wound with wound cleanser prior to applying a clean  dressing using gauze sponges, not tissue or cotton balls. Peri-Wound Care Topical Dakin's Solution Discharge Instruction: Apply Dakins moistened gauze to wounds Primary Dressing Secondary Dressing ABD Pad, 8x10 Discharge Instruction: Apply over primary dressing as directed. NonWoven Sponge, 4x4 (in/in) Discharge Instruction: Apply over primary dressing as directed Secured With Kerlix Roll Sterile, 4.5x3.1 (in/yd) Discharge Instruction: Secure with Kerlix as directed. Transpore Surgical Tape, 2x10 (in/yd) Discharge Instruction: Secure dressing with tape as directed. Compression Wrap Compression Stockings Add-Ons Electronic Signature(s) Signed: 06/14/2021 5:11:58 PM By: Kalman Shan DO Signed: 06/14/2021 5:29:47 PM By: Candace Robinson Entered By: Kalman Shan on 06/14/2021 Robinson:58:12 -------------------------------------------------------------------------------- Multi-Disciplinary Care Plan Details Patient Name: Date of Service: Candace Robinson, [name] 06/14/2021 2:00 PM Medical Record Number: [id number] Patient Account Number: [id number] Date of Birth/Sex: Treating RN: 04/14/1966 (55 y.o. Candace Robinson Primary Care Holdan Stucke: Candace Robinson Other Clinician: Referring Audree Schrecengost: Treating Kealie Barrie/Extender: Candace Robinson in Treatment: Robinson Active Inactive Wound/Skin Impairment Nursing Diagnoses: Impaired tissue integrity Goals: Patient/caregiver will verbalize understanding of skin care regimen Date Initiated: 02/22/2021 Target Resolution Date: 06/29/2021 Goal Status: Active Interventions: Assess patient/caregiver ability to obtain necessary supplies Assess patient/caregiver ability to perform ulcer/skin care regimen upon admission and as needed Assess ulceration(s) every visit Provide education on ulcer and skin care Treatment Activities: Topical wound management initiated : 02/22/2021 Notes: 03/25/21: All wounds not yet at 30%,  culture positive. Using gent cream. 05/31/21: Wound regimen continues, patient has not been compliant with all aspects of wound treatment. Electronic Signature(s) Signed: 06/14/2021 5:29:47 PM By: Candace Robinson Entered By: Candace Robinson on 06/14/2021 14:34:12 -------------------------------------------------------------------------------- Pain Assessment Details Patient Name: Date of Service: Candace A DNA X, CA  RO L L. 06/14/2021 2:00 PM Medical Record Number: [id number] Patient Account Number: [id number] Date of Birth/Sex: Treating RN: 1965-10-25 (55 y.o. Candace Robinson Primary Care Jishnu Jenniges: Candace Robinson Other Clinician: Referring Lavarius Doughten: Treating Panagiota Perfetti/Extender: Candace Robinson in Treatment: Robinson Active Problems Location of Pain Severity and Description of Pain Patient Has Paino Yes Site Locations Pain Location: Pain in Ulcers With Dressing Change: Yes Duration of the Pain. Constant / Intermittento Intermittent Rate the pain. Current Pain Level: 6 Character of Pain Describe the Pain: Burning, Tender, Throbbing Pain Management and Medication Current Pain Management: Medication: Yes Cold Application: No Rest: Yes Massage: No Activity: No T.E.N.S.: No Heat Application: No Leg drop or elevation: No Is the Current Pain Management Adequate: Adequate How does your wound impact your activities of daily livingo Sleep: Yes Bathing: No Appetite: No Relationship With Others: No Bladder Continence: No Emotions: No Bowel Continence: No Work: No Toileting: No Drive: No Dressing: No Hobbies: No Electronic Signature(s) Signed: 06/14/2021 5:29:47 PM By: Candace Robinson Entered By: Candace Robinson on 06/14/2021 14:40:26 -------------------------------------------------------------------------------- Patient/Caregiver Education Details Patient Name: Date of Service: Candace A [name], [name] 12/12/2022andnbsp2:00 PM Medical Record Number:  [id number] Patient Account Number: [id number] Date of Birth/Gender: Treating RN: 27-Aug-1965 (55 y.o. Candace Robinson Primary Care Physician: Candace Robinson Other Clinician: Referring Physician: Treating Physician/Extender: Candace Robinson in Treatment: Robinson Education Assessment Education Provided To: Patient Education Topics Provided Wound/Skin Impairment: Methods: Explain/Verbal, Printed Responses: State content correctly [employer]) Signed: 06/14/2021 5:29:47 PM By: Candace Robinson Entered By: Candace Robinson on 06/14/2021 14:34:31 -------------------------------------------------------------------------------- Wound Assessment Details Patient Name: Date of Service: Candace A DNA X, [name] 06/14/2021 2:00 PM Medical Record Number: [id number] Patient Account Number: [id number] Date of Birth/Sex: Treating RN: 11-02-1965 (55 y.o. Candace Robinson Primary Care Shyna Duignan: Candace Robinson Other Clinician: Referring Helia Haese: Treating [name]/Extender: [name] Weeks in Treatment: Robinson Wound Status Wound Number: 1 Primary Diabetic Wound/Ulcer of the Lower Extremity Etiology: Wound Location: Right, Medial Ankle Wound Open Wounding Event: Gradually Appeared Status: Date Acquired: 04/03/2020 Comorbid Anemia, Arrhythmia, Congestive Heart Failure, Hypertension, Weeks Of Treatment: Robinson History: Type II Diabetes, Lupus Erythematosus Clustered Wound: No Wound Measurements Length: (cm) 6 Width: (cm) 7 Depth: (cm) 0.3 Area: (cm) 32.987 Volume: (cm) 9.896 % Reduction in Area: -100% % Reduction in Volume: -200% Epithelialization: Small (1-33%) Tunneling: No Undermining: No Wound Description Classification: Grade 2 Wound Margin: Distinct, outline attached Exudate Amount: Medium Exudate Type: Purulent Exudate Color: yellow, brown, green Foul Odor After Cleansing: Yes Due to Product Use: No Slough/Fibrino  Yes Wound Bed Granulation Amount: Small (1-33%) Exposed Structure Necrotic Amount: Large (67-100%) Fascia Exposed: No Necrotic Quality: Eschar, Adherent Slough Fat Layer (Subcutaneous Tissue) Exposed: Yes Tendon Exposed: No Muscle Exposed: No Joint Exposed: No Bone Exposed: No Treatment Notes Wound #1 (Ankle) Wound Laterality: Right, Medial Cleanser Soap and Water Discharge Instruction: May shower and wash wound with dial antibacterial soap and water prior to dressing change. Wound Cleanser Discharge Instruction: Cleanse the wound with wound cleanser prior to applying a clean dressing using gauze sponges, not tissue or cotton balls. Peri-Wound Care Topical Dakin's Solution Discharge Instruction: Apply Dakins moistened gauze to wounds Primary Dressing Secondary Dressing ABD Pad, 8x10 Discharge Instruction: Apply over primary dressing as directed. NonWoven Sponge, 4x4 (in/in) Discharge Instruction: Apply over primary dressing as directed Secured With Kerlix Roll Sterile, 4.5x3.1 (in/yd) Discharge Instruction: Secure with Kerlix as directed. Transpore Surgical Tape, 2x10 (in/yd) Discharge  Instruction: Secure dressing with tape as directed. Compression Wrap Compression Stockings Add-Ons Electronic Signature(s) Signed: 06/14/2021 5:29:47 PM By: Candace Robinson Entered By: Candace Robinson on 06/14/2021 14:58:44 -------------------------------------------------------------------------------- Wound Assessment Details Patient Name: Date of Service: Candace A DNA X, [name] 06/14/2021 2:00 PM Medical Record Number: [id number] Patient Account Number: [id number] Date of Birth/Sex: Treating RN: 1966/01/19 (56 y.o. Candace Robinson Primary Care Aury Scollard: Candace Robinson Other Clinician: Referring Daric Koren: Treating Ciclaly Mulcahey/Extender: [name] Weeks in Treatment: Robinson Wound Status Wound Number: 2 Primary Diabetic Wound/Ulcer of the Lower  Extremity Etiology: Wound Location: Right, Dorsal Foot Wound Open Wounding Event: Gradually Appeared Status: Date Acquired: 04/03/2020 Comorbid Anemia, Arrhythmia, Congestive Heart Failure, Hypertension, Weeks Of Treatment: Robinson History: Type II Diabetes, Lupus Erythematosus Clustered Wound: No Wound Measurements Length: (cm) 6.5 Width: (cm) 6 Depth: (cm) 0.2 Area: (cm) 30.631 Volume: (cm) 6.126 % Reduction in Area: -160% % Reduction in Volume: -420% Epithelialization: Small (1-33%) Tunneling: No Undermining: No Wound Description Classification: Grade 2 Wound Margin: Distinct, outline attached Exudate Amount: Medium Exudate Type: Purulent Exudate Color: yellow, brown, green Foul Odor After Cleansing: Yes Due to Product Use: No Slough/Fibrino Yes Wound Bed Granulation Amount: Small (1-33%) Exposed Structure Granulation Quality: Red Fascia Exposed: No Necrotic Amount: Large (67-100%) Fat Layer (Subcutaneous Tissue) Exposed: Yes Necrotic Quality: Eschar, Adherent Slough Tendon Exposed: No Muscle Exposed: No Joint Exposed: No Bone Exposed: No Treatment Notes Wound #2 (Foot) Wound Laterality: Dorsal, Right Cleanser Soap and Water Discharge Instruction: May shower and wash wound with dial antibacterial soap and water prior to dressing change. Wound Cleanser Discharge Instruction: Cleanse the wound with wound cleanser prior to applying a clean dressing using gauze sponges, not tissue or cotton balls. Peri-Wound Care Topical Dakin's Solution Discharge Instruction: Apply Dakins moistened gauze to wounds Primary Dressing Secondary Dressing ABD Pad, 8x10 Discharge Instruction: Apply over primary dressing as directed. NonWoven Sponge, 4x4 (in/in) Discharge Instruction: Apply over primary dressing as directed Secured With Kerlix Roll Sterile, 4.5x3.1 (in/yd) Discharge Instruction: Secure with Kerlix as directed. Transpore Surgical Tape, 2x10 (in/yd) Discharge  Instruction: Secure dressing with tape as directed. Compression Wrap Compression Stockings Add-Ons Electronic Signature(s) Signed: 06/14/2021 5:29:47 PM By: Candace Robinson Entered By: Candace Robinson on 06/14/2021 14:59:37 -------------------------------------------------------------------------------- Wound Assessment Details Patient Name: Date of Service: Candace A DNA X, [name] 06/14/2021 2:00 PM Medical Record Number: [id number] Patient Account Number: [id number] Date of Birth/Sex: Treating RN: [birth year] (56 y.o. Candace Robinson Primary Care Shenequa Howse: Candace Robinson Other Clinician: Referring Mizael Sagar: Treating Rielly Brunn/Extender: [name] Weeks in Treatment: Robinson Wound Status Wound Number: 3 Primary Diabetic Wound/Ulcer of the Lower Extremity Etiology: Wound Location: Left, Medial Ankle Wound Open Wounding Event: Gradually Appeared Status: Date Acquired: 02/22/2021 Comorbid Anemia, Arrhythmia, Congestive Heart Failure, Hypertension, Weeks Of Treatment: Robinson History: Type II Diabetes, Lupus Erythematosus Clustered Wound: No Wound Measurements Length: (cm) 1 Width: (cm) 1.7 Depth: (cm) 0.3 Area: (cm) 1.335 Volume: (cm) 0.401 % Reduction in Area: 32% % Reduction in Volume: -2% Epithelialization: Small (1-33%) Tunneling: No Undermining: No Wound Description Classification: Grade 2 Wound Margin: Distinct, outline attached Exudate Amount: Medium Exudate Type: Serosanguineous Exudate Color: red, brown Foul Odor After Cleansing: No Slough/Fibrino Yes Wound Bed Granulation Amount: Medium (34-66%) Exposed Structure Granulation Quality: Red Fascia Exposed: No Necrotic Amount: Medium (34-66%) Fat Layer (Subcutaneous Tissue) Exposed: Yes Necrotic Quality: Adherent Slough Tendon Exposed: No Muscle Exposed: No Joint Exposed: No Bone Exposed: No Treatment Notes Wound #3 (Ankle) Wound Laterality:  Left, Medial Cleanser Soap and  Water Discharge Instruction: May shower and wash wound with dial antibacterial soap and water prior to dressing change. Wound Cleanser Discharge Instruction: Cleanse the wound with wound cleanser prior to applying a clean dressing using gauze sponges, not tissue or cotton balls. Peri-Wound Care Topical Dakin's Solution Discharge Instruction: Apply Dakins moistened gauze to wounds Primary Dressing Secondary Dressing ABD Pad, 8x10 Discharge Instruction: Apply over primary dressing as directed. NonWoven Sponge, 4x4 (in/in) Discharge Instruction: Apply over primary dressing as directed Secured With Kerlix Roll Sterile, 4.5x3.1 (in/yd) Discharge Instruction: Secure with Kerlix as directed. Transpore Surgical Tape, 2x10 (in/yd) Discharge Instruction: Secure dressing with tape as directed. Compression Wrap Compression Stockings Add-Ons Electronic Signature(s) Signed: 06/14/2021 5:29:47 PM By: Candace Robinson Entered By: Candace Robinson on 06/14/2021 15:00:21 -------------------------------------------------------------------------------- Wound Assessment Details Patient Name: Date of Service: Candace A DNA X, [name] 06/14/2021 2:00 PM Medical Record Number: [id number] Patient Account Number: [id number] Date of Birth/Sex: Treating RN: [birth year] (55 y.o. Candace Robinson Primary Care Irven Ingalsbe: Candace Robinson Other Clinician: Referring Elisea Khader: Treating Keston Seever/Extender: [name] Weeks in Treatment: Robinson Wound Status Wound Number: 4 Primary Diabetic Wound/Ulcer of the Lower Extremity Etiology: Wound Location: Left, Lateral Ankle Wound Open Wounding Event: Gradually Appeared Status: Date Acquired: 04/03/2020 Comorbid Anemia, Arrhythmia, Congestive Heart Failure, Hypertension, Weeks Of Treatment: Robinson History: Type II Diabetes, Lupus Erythematosus Clustered Wound: No Wound Measurements Length: (cm) 7 Width: (cm) 5.5 Depth: (cm) 0.3 Area: (cm)  30.238 Volume: (cm) 9.071 % Reduction in Area: 19.8% % Reduction in Volume: -20.3% Epithelialization: None Tunneling: No Undermining: No Wound Description Classification: Grade 2 Wound Margin: Distinct, outline attached Exudate Amount: Medium Exudate Type: Purulent Exudate Color: yellow, brown, green Foul Odor After Cleansing: No Slough/Fibrino Yes Wound Bed Granulation Amount: Small (1-33%) Exposed Structure Granulation Quality: Red Fascia Exposed: No Necrotic Amount: Large (67-100%) Fat Layer (Subcutaneous Tissue) Exposed: Yes Necrotic Quality: Eschar, Adherent Slough Tendon Exposed: No Muscle Exposed: No Joint Exposed: No Bone Exposed: No Treatment Notes Wound #4 (Ankle) Wound Laterality: Left, Lateral Cleanser Soap and Water Discharge Instruction: May shower and wash wound with dial antibacterial soap and water prior to dressing change. Wound Cleanser Discharge Instruction: Cleanse the wound with wound cleanser prior to applying a clean dressing using gauze sponges, not tissue or cotton balls. Peri-Wound Care Topical Dakin's Solution Discharge Instruction: Apply Dakins moistened gauze to wounds Primary Dressing Secondary Dressing ABD Pad, 8x10 Discharge Instruction: Apply over primary dressing as directed. NonWoven Sponge, 4x4 (in/in) Discharge Instruction: Apply over primary dressing as directed Secured With Kerlix Roll Sterile, 4.5x3.1 (in/yd) Discharge Instruction: Secure with Kerlix as directed. Transpore Surgical Tape, 2x10 (in/yd) Discharge Instruction: Secure dressing with tape as directed. Compression Wrap Compression Stockings Add-Ons Electronic Signature(s) Signed: 06/14/2021 5:29:47 PM By: Candace Robinson Entered By: Candace Robinson on 06/14/2021 15:01:36 -------------------------------------------------------------------------------- Wound Assessment Details Patient Name: Date of Service: Candace A DNA X, [name] 06/14/2021 2:00 PM Medical  Record Number: [id number] Patient Account Number: [id number] Date of Birth/Sex: Treating RN: 12-15-1965 (55 y.o. Candace Robinson Primary Care Valeree Leidy: Candace Robinson Other Clinician: Referring Joycelynn Fritsche: Treating Leana Springston/Extender: [name] Weeks in Treatment: Robinson Wound Status Wound Number: 5 Primary Diabetic Wound/Ulcer of the Lower Extremity Etiology: Wound Location: Left, Dorsal Foot Wound Open Wounding Event: Gradually Appeared Status: Date Acquired: 04/03/2020 Comorbid Anemia, Arrhythmia, Congestive Heart Failure, Hypertension, Weeks Of Treatment: Robinson History: Type II Diabetes, Lupus Erythematosus Clustered Wound: No Wound Measurements Length: (cm) 10 Width: (  cm) 8.5 Depth: (cm) 0.3 Area: (cm) 66.759 Volume: (cm) 20.028 % Reduction in Area: -11.1% % Reduction in Volume: -233.4% Epithelialization: Small (1-33%) Wound Description Classification: Grade 2 Wound Margin: Distinct, outline attached Exudate Amount: Medium Exudate Type: Purulent Exudate Color: yellow, brown, green Foul Odor After Cleansing: Yes Due to Product Use: No Slough/Fibrino Yes Wound Bed Granulation Amount: None Present (0%) Exposed Structure Necrotic Amount: Large (67-100%) Fascia Exposed: No Necrotic Quality: Eschar, Adherent Slough Fat Layer (Subcutaneous Tissue) Exposed: Yes Tendon Exposed: No Muscle Exposed: No Joint Exposed: No Bone Exposed: No Treatment Notes Wound #5 (Foot) Wound Laterality: Dorsal, Left Cleanser Soap and Water Discharge Instruction: May shower and wash wound with dial antibacterial soap and water prior to dressing change. Wound Cleanser Discharge Instruction: Cleanse the wound with wound cleanser prior to applying a clean dressing using gauze sponges, not tissue or cotton balls. Peri-Wound Care Topical Dakin's Solution Discharge Instruction: Apply Dakins moistened gauze to wounds Primary Dressing Secondary Dressing ABD Pad,  8x10 Discharge Instruction: Apply over primary dressing as directed. NonWoven Sponge, 4x4 (in/in) Discharge Instruction: Apply over primary dressing as directed Secured With Kerlix Roll Sterile, 4.5x3.1 (in/yd) Discharge Instruction: Secure with Kerlix as directed. Transpore Surgical Tape, 2x10 (in/yd) Discharge Instruction: Secure dressing with tape as directed. Compression Wrap Compression Stockings Add-Ons Electronic Signature(s) Signed: 06/14/2021 5:29:47 PM By: Candace Robinson Entered By: Candace Robinson on 06/14/2021 15:02:15 -------------------------------------------------------------------------------- Wound Assessment Details Patient Name: Date of Service: Candace A DNA X, [name] 06/14/2021 2:00 PM Medical Record Number: [id number] Patient Account Number: [id number] Date of Birth/Sex: Treating RN: 04-16-1966 (55 y.o. Candace Robinson Primary Care Lirio Bach: Candace Robinson Other Clinician: Referring Atziry Baranski: Treating Broadus Costilla/Extender: [name] Weeks in Treatment: Robinson Wound Status Wound Number: 6 Primary Diabetic Wound/Ulcer of the Lower Extremity Etiology: Wound Location: Left, Distal, Medial Foot Wound Open Wounding Event: Gradually Appeared Status: Date Acquired: 04/03/2020 Comorbid Anemia, Arrhythmia, Congestive Heart Failure, Hypertension, Weeks Of Treatment: Robinson History: Type II Diabetes, Lupus Erythematosus Clustered Wound: No Wound Measurements Length: (cm) 1.2 Width: (cm) 1.2 Depth: (cm) 0.2 Area: (cm) 1.131 Volume: (cm) 0.226 % Reduction in Area: -260.2% % Reduction in Volume: -629% Epithelialization: Medium (34-66%) Tunneling: No Undermining: No Wound Description Classification: Grade 2 Wound Margin: Distinct, outline attached Exudate Amount: Small Exudate Type: Serosanguineous Exudate Color: red, brown Foul Odor After Cleansing: No Slough/Fibrino Yes Wound Bed Granulation Amount: Medium (34-66%) Exposed  Structure Granulation Quality: Red Fascia Exposed: No Necrotic Amount: Medium (34-66%) Fat Layer (Subcutaneous Tissue) Exposed: Yes Necrotic Quality: Adherent Slough Tendon Exposed: No Muscle Exposed: No Joint Exposed: No Bone Exposed: No Treatment Notes Wound #6 (Foot) Wound Laterality: Left, Medial, Distal Cleanser Soap and Water Discharge Instruction: May shower and wash wound with dial antibacterial soap and water prior to dressing change. Wound Cleanser Discharge Instruction: Cleanse the wound with wound cleanser prior to applying a clean dressing using gauze sponges, not tissue or cotton balls. Peri-Wound Care Topical Dakin's Solution Discharge Instruction: Apply Dakins moistened gauze to wounds Primary Dressing Secondary Dressing ABD Pad, 8x10 Discharge Instruction: Apply over primary dressing as directed. NonWoven Sponge, 4x4 (in/in) Discharge Instruction: Apply over primary dressing as directed Secured With Kerlix Roll Sterile, 4.5x3.1 (in/yd) Discharge Instruction: Secure with Kerlix as directed. Transpore Surgical Tape, 2x10 (in/yd) Discharge Instruction: Secure dressing with tape as directed. Compression Wrap Compression Stockings Add-Ons Electronic Signature(s) Signed: 06/14/2021 5:29:47 PM By: Candace Robinson Entered By: Candace Robinson on 06/14/2021 15:02:56 -------------------------------------------------------------------------------- Wound Assessment Details Patient Name: Date of Service:  Candace A DNA X, [name] 06/14/2021 2:00 PM Medical Record Number: [id number] Patient Account Number: [id number] Date of Birth/Sex: Treating RN: [birth year] (55 y.o. Candace Robinson Primary Care Nyshaun Standage: Candace Robinson Other Clinician: Referring Harlie Buening: Treating Clelia Trabucco/Extender: [name] Weeks in Treatment: Robinson Wound Status Wound Number: 7 Primary Lupus Etiology: Wound Location: Right, Lateral Ankle Wound Open Wounding Event:  Gradually Appeared Status: Date Acquired: 04/08/2021 Comorbid Anemia, Arrhythmia, Congestive Heart Failure, Hypertension, Weeks Of Treatment: 9 History: Type II Diabetes, Lupus Erythematosus Clustered Wound: No Wound Measurements Length: (cm) 1.2 Width: (cm) 1.7 Depth: (cm) 0.2 Area: (cm) 1.602 Volume: (cm) 0.32 % Reduction in Area: -5067.7% % Reduction in Volume: -10566.7% Epithelialization: Small (1-33%) Tunneling: No Undermining: No Wound Description Classification: Full Thickness Without Exposed Support Structures Wound Margin: Distinct, outline attached Exudate Amount: Medium Exudate Type: Serosanguineous Exudate Color: red, brown Foul Odor After Cleansing: No Slough/Fibrino Yes Wound Bed Granulation Amount: Medium (34-66%) Exposed Structure Granulation Quality: Pink Fascia Exposed: No Necrotic Amount: Medium (34-66%) Fat Layer (Subcutaneous Tissue) Exposed: Yes Necrotic Quality: Adherent Slough Tendon Exposed: No Muscle Exposed: No Joint Exposed: No Bone Exposed: No Treatment Notes Wound #7 (Ankle) Wound Laterality: Right, Lateral Cleanser Soap and Water Discharge Instruction: May shower and wash wound with dial antibacterial soap and water prior to dressing change. Wound Cleanser Discharge Instruction: Cleanse the wound with wound cleanser prior to applying a clean dressing using gauze sponges, not tissue or cotton balls. Peri-Wound Care Topical Dakin's Solution Discharge Instruction: Apply Dakins moistened gauze to wounds Primary Dressing Secondary Dressing ABD Pad, 8x10 Discharge Instruction: Apply over primary dressing as directed. NonWoven Sponge, 4x4 (in/in) Discharge Instruction: Apply over primary dressing as directed Secured With Kerlix Roll Sterile, 4.5x3.1 (in/yd) Discharge Instruction: Secure with Kerlix as directed. Transpore Surgical Tape, 2x10 (in/yd) Discharge Instruction: Secure dressing with tape as directed. Compression  Wrap Compression Stockings Add-Ons Electronic Signature(s) Signed: 06/14/2021 5:29:47 PM By: Candace Robinson Entered By: Candace Robinson on 06/14/2021 15:03:26 -------------------------------------------------------------------------------- Vitals Details Patient Name: Date of Service: Candace A [name], [name] 06/14/2021 2:00 PM Medical Record Number: [id number] Patient Account Number: [id number] Date of Birth/Sex: Treating RN: 11-Sep-1965 (55 y.o. Candace Robinson Primary Care Desi Carby: Candace Robinson Other Clinician: Referring Mackenzey Crownover: Treating Synda Bagent/Extender: Candace Robinson in Treatment: Robinson Vital Signs Time Taken: 14:39 Temperature (F): 98.2 Height (in): 63 Pulse (bpm): 111 Weight (lbs): 157 Respiratory Rate (breaths/min): 18 Body Mass Index (BMI): 27.8 Blood Pressure (mmHg): 130/75 Reference Range: 80 - 120 mg / dl Electronic Signature(s) Signed: 06/14/2021 5:29:47 PM By: Candace Robinson Entered By: Candace Robinson on 06/14/2021 14:39:37

## 2021-06-21 ENCOUNTER — Encounter (HOSPITAL_BASED_OUTPATIENT_CLINIC_OR_DEPARTMENT_OTHER): Payer: Medicaid Other | Admitting: Internal Medicine

## 2021-06-21 ENCOUNTER — Telehealth: Payer: Medicaid Other | Admitting: Nurse Practitioner

## 2021-06-22 NOTE — Progress Notes (Signed)
Office Visit Note  Patient: Candace Robinson             Date of Birth: [birth year]           MRN: [id number]             PCP: [name], MD Referring: [name], MD Visit Date: 07/06/2021 Occupation: @GUAROCC @  Subjective:  Recurrent chronic ulcers on bilateral ankles and feet.Marland Kitchen   History of Present Illness: Candace Robinson is a 55 y.o. female seen in consultation per request of her PCP.  According to the patient she has had lower extremity edema for many years and she uses compression socks.  She has had recurrent chronic ulcers on her bilateral ankles and her feet for the last 20 years.  She had been going to the wound care center in [place].  She states the ulcers occur almost every 6 months.  Now the ulcers have become more frequent.  She takes frequent courses of antibiotics.  She switched her care to Dr. Heber [place] in [place] in July 2022.  She states she also had a biopsy by Dr. Heber [place] and was referred to vascular surgery.  Arterial and venous studies were normal.  There was nothing to offer from the vascular surgery point of view.  She states that she was extremely overweight but after she developed thyrotoxicosis she lost weight.  She recalls that in 2006 she developed a lesion on her left cheek which she initially thought was a mole.  At the time she was seen by dermatologist Dr. Frederico Hamman who performed a biopsy and diagnosed her with cutaneous lupus.  She was treated with Protopic ointment since then.  Over time she developed hair loss and some scarring on her scalp.  She states she has intermittent rash on her arms which responds to Protopic.  She denies any history of oral ulcers, nasal ulcers, malar rash, photosensitivity, Raynaud's phenomenon or lymphadenopathy.  She has a cousin with cutaneous lupus and 2 cousins with systemic lupus.  She is gravida 1, para 1, miscarriages 0.  There is no history of DVTs.  Activities of Daily Living:  Patient reports  morning stiffness for 0 minutes.   Patient Reports nocturnal pain.  Difficulty dressing/grooming: Denies Difficulty climbing stairs: Reports Difficulty getting out of chair: Reports Difficulty using hands for taps, buttons, cutlery, and/or writing: Reports  Review of Systems  Constitutional:  Positive for fatigue.  HENT:  Negative for mouth sores, mouth dryness and nose dryness.   Eyes:  Negative for pain, itching and dryness.  Respiratory:  Negative for shortness of breath and difficulty breathing.   Cardiovascular:  Negative for chest pain and palpitations.  Gastrointestinal:  Negative for constipation and diarrhea.  Endocrine: Negative for increased urination.  Genitourinary:  Negative for difficulty urinating.  Musculoskeletal:  Negative for joint pain, joint pain, joint swelling, myalgias, morning stiffness, muscle tenderness and myalgias.  Skin:  Positive for hair loss and ulcers. Negative for color change, rash and sensitivity to sunlight.  Allergic/Immunologic: Negative for susceptible to infections.  Neurological:  Positive for weakness. Negative for dizziness, headaches and memory loss.  Hematological:  Positive for anemia. Negative for swollen glands.  Psychiatric/Behavioral:  Negative for depressed mood, confusion and sleep disturbance. The patient is not nervous/anxious.    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   Iron deficiency anemia due to dietary causes 09/18/2020   Chronic anemia 09/18/2020   GERD  without esophagitis 09/18/2020   Controlled type 2 diabetes mellitus with circulatory disorder ([place]) 09/18/2020   Chronic diastolic heart failure ([place]) 09/18/2020   Aortic atherosclerosis ([place]) 09/18/2020   Vitamin D deficiency 09/18/2020   Folate deficiency 09/18/2020   Hypertension associated with type 2 diabetes mellitus ([place]) 09/18/2020   Wound infection    Other pancytopenia ([place])     Thyrotoxicosis 09/01/2020   Atrial fibrillation with RVR (HCC)    Ulcers of both lower extremities ([place])    Acute encephalopathy 08/31/2020    Past Medical History:  Diagnosis Date   Anemia    Atrial fibrillation (HCC)    CHF (congestive heart failure) (HCC)    Diabetes mellitus without complication (HCC)    Heart murmur    Hypertension    Lupus ([place])    Thyroid disease    hyperthyroid    Family History  Problem Relation Age of Onset   Hypertension Mother    Diabetes Mother    COPD Mother    Glaucoma Mother    Cancer - Colon Father    Diabetes Sister    Glaucoma Sister    Uveitis Daughter    Lupus Cousin        systemic   Lupus Cousin        systemic   Lupus Cousin        cutaneous   Past Surgical History:  Procedure Laterality Date   ABLATION Right    approximately 2015. right lower leg   TONSILLECTOMY     age 79   Social History   Social History Narrative   Not on file   Immunization History  Administered Date(s) Administered   PFIZER(Purple Top)SARS-COV-2 Vaccination 09/12/2019, 06/30/2020     Objective: Vital Signs: BP 135/81 (BP Location: Right Arm, Patient Position: Sitting, Cuff Size: Normal)    Pulse 78    Ht 5\' 3"  (1.6 m)    Wt 159 lb (72.1 kg)    BMI 28.17 kg/m    Physical Exam Vitals and nursing note reviewed.  Constitutional:      Appearance: She is well-developed.  HENT:     Head: Normocephalic and atraumatic.  Eyes:     Conjunctiva/sclera: Conjunctivae normal.  Cardiovascular:     Rate and Rhythm: Normal rate and regular rhythm.     Heart sounds: Normal heart sounds.  Pulmonary:     Effort: Pulmonary effort is normal.     Breath sounds: Normal breath sounds.  Abdominal:     General: Bowel sounds are normal.     Palpations: Abdomen is soft.  Musculoskeletal:     Cervical back: Normal range of motion.  Lymphadenopathy:     Cervical: No cervical adenopathy.  Skin:    General: Skin is warm and dry.     Capillary Refill: Capillary  refill takes less than 2 seconds.     Comments: Multiple deep ulcerations were noted over bilateral ankles and feet.  No sclerodactyly, telangiectasias or nailbed capillary changes were noted.  Neurological:     Mental Status: She is alert and oriented to person, place, and time.  Psychiatric:        Behavior: Behavior normal.     Musculoskeletal Exam: C-spine was in good range of motion.  Shoulder joints, elbow joints, wrist joints, MCPs PIPs and DIPs with good range of motion.  Hip joints and knee joints in good range of motion.  She had no tenderness over ankles or MTPs.  She had edema over bilateral lower  extremities with hyperpigmentation.  She had multiple deep ulcerations over bilateral ankles and dorsum of her feet.  Some granulation tissue was noted.  Fatty tissue was exposed.  Her toes were warm to touch.  CDAI Exam: CDAI Score: -- Patient Global: --; Provider Global: -- Swollen: --; Tender: -- Joint Exam 07/06/2021   No joint exam has been documented for this visit   There is currently no information documented on the homunculus. Go to the Rheumatology activity and complete the homunculus joint exam.  Investigation: No additional findings.  Imaging: No results found.  Recent Labs: Lab Results  Component Value Date   WBC 3.2 (L) 09/21/2020   HGB 7.7 (L) 09/21/2020   PLT 208 09/21/2020   NA 133 (L) 09/17/2020   K 4.2 09/17/2020   CL 108 09/17/2020   CO2 20 (L) 09/17/2020   GLUCOSE 92 09/17/2020   BUN 7 09/17/2020   CREATININE 0.45 09/17/2020   BILITOT 0.8 09/10/2020   ALKPHOS 146 (H) 09/10/2020   AST 52 (H) 09/10/2020   ALT 33 09/10/2020   PROT 6.6 09/10/2020   ALBUMIN 2.1 (L) 09/10/2020   CALCIUM 9.2 09/17/2020    Speciality Comments: No specialty comments available.  Procedures:  No procedures performed Allergies: Motrin [ibuprofen]   Assessment / Plan:     Visit Diagnoses: Cutaneous lupus erythematosus - Dx in 2006 by Dr. Memory Argue, bx proven per  patient -patient was diagnosed with cutaneous lupus several years ago.  She has hair loss and some scarring of her scalp.  There is no history of oral ulcers, nasal ulcers, malar rash, photosensitivity or lymphadenopathy.  There is no history of inflammatory arthritis.  I will obtain following labs today.  Patient wants to see a dermatologist in [place].  I will refer her to Dr. Sharol Roussel. Plan: Urinalysis, Routine w reflex microscopic, Sedimentation rate, ANA, Anti-DNA antibody, double-stranded, Sjogrens syndrome-B extractable nuclear antibody, Sjogrens syndrome-A extractable nuclear antibody, Anti-Smith antibody, RNP Antibody, Anti-scleroderma antibody, C3 and C4, Beta-2 glycoprotein antibodies, Cardiolipin antibodies, IgG, IgM, IgA, Lupus Anticoagulant Eval w/Reflex  Skin ulcers of both feet (HCC) -she had deep ulcerations over bilateral ankles and feet.  Evaluated by Dr. Donzetta Matters (vascular surgery) -patient has been experiencing recurrent ulcers on her bilateral lower extremities for the last 20 years.  She was initially in repair of wound management in [place] and has been under care of Dr. Heber [place] since July  2022 plan: CK, Cryoglobulin, Pan-ANCA.  She is also evaluated by vascular surgery.  Arterial and venous studies were normal.  Other pancytopenia ([place]) -last labs were in March 2022.  Plan: CBC with Differential/Platelet  Other fatigue - Plan: COMPLETE METABOLIC PANEL WITH GFR  Hypertension associated with type 2 diabetes mellitus (HCC)-her blood pressure was normal today.  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, ttd with radio iodine  Vitamin D deficiency  Iron deficiency anemia due to dietary  causes  Folate deficiency  Orders: Orders Placed This Encounter  Procedures   CBC with Differential/Platelet   COMPLETE METABOLIC PANEL WITH GFR   Urinalysis, Routine w reflex microscopic   Sedimentation rate   CK   ANA   Anti-DNA antibody, double-stranded   Sjogrens syndrome-B extractable nuclear antibody   Sjogrens syndrome-A extractable nuclear antibody   Anti-Smith antibody  RNP Antibody   Anti-scleroderma antibody   C3 and C4   Beta-2 glycoprotein antibodies   Cardiolipin antibodies, IgG, IgM, IgA   Lupus Anticoagulant Eval w/Reflex   Cryoglobulin   Pan-ANCA   Ambulatory referral to Dermatology   No orders of the defined types were placed in this encounter.    Follow-Up Instructions: Return for Recurrent skin ulcers and cutaneous lupus.   Bo Merino, MD  Note - This record has been created using Editor, commissioning.  Chart creation errors have been sought, but may not always  have been located. Such creation errors do not reflect on  the standard of medical care.

## 2021-06-24 ENCOUNTER — Encounter (HOSPITAL_BASED_OUTPATIENT_CLINIC_OR_DEPARTMENT_OTHER): Payer: Medicaid Other | Admitting: Internal Medicine

## 2021-07-06 ENCOUNTER — Ambulatory Visit (INDEPENDENT_AMBULATORY_CARE_PROVIDER_SITE_OTHER): Payer: Medicaid Other | Admitting: Rheumatology

## 2021-07-06 ENCOUNTER — Other Ambulatory Visit: Payer: Self-pay

## 2021-07-06 ENCOUNTER — Encounter: Payer: Self-pay | Admitting: Rheumatology

## 2021-07-06 VITALS — BP 135/81 | HR 78 | Ht 63.0 in | Wt 159.0 lb

## 2021-07-06 DIAGNOSIS — E0581 Other thyrotoxicosis with thyrotoxic crisis or storm: Secondary | ICD-10-CM

## 2021-07-06 DIAGNOSIS — I5032 Chronic diastolic (congestive) heart failure: Secondary | ICD-10-CM

## 2021-07-06 DIAGNOSIS — E1159 Type 2 diabetes mellitus with other circulatory complications: Secondary | ICD-10-CM

## 2021-07-06 DIAGNOSIS — I152 Hypertension secondary to endocrine disorders: Secondary | ICD-10-CM

## 2021-07-06 DIAGNOSIS — K219 Gastro-esophageal reflux disease without esophagitis: Secondary | ICD-10-CM

## 2021-07-06 DIAGNOSIS — E538 Deficiency of other specified B group vitamins: Secondary | ICD-10-CM

## 2021-07-06 DIAGNOSIS — R5383 Other fatigue: Secondary | ICD-10-CM | POA: Diagnosis not present

## 2021-07-06 DIAGNOSIS — L932 Other local lupus erythematosus: Secondary | ICD-10-CM | POA: Diagnosis not present

## 2021-07-06 DIAGNOSIS — I4891 Unspecified atrial fibrillation: Secondary | ICD-10-CM

## 2021-07-06 DIAGNOSIS — L97519 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: Secondary | ICD-10-CM

## 2021-07-06 DIAGNOSIS — D508 Other iron deficiency anemias: Secondary | ICD-10-CM

## 2021-07-06 DIAGNOSIS — D61818 Other pancytopenia: Secondary | ICD-10-CM | POA: Diagnosis not present

## 2021-07-06 DIAGNOSIS — I700 Atherosclerosis of aorta: Secondary | ICD-10-CM

## 2021-07-06 DIAGNOSIS — L97529 Non-pressure chronic ulcer of other part of left foot with unspecified severity: Secondary | ICD-10-CM

## 2021-07-06 DIAGNOSIS — E559 Vitamin D deficiency, unspecified: Secondary | ICD-10-CM

## 2021-07-07 NOTE — Progress Notes (Signed)
Severe anemia noted.  Elevated creatinine noted.  Rest the labs are pending.  Please notify patient and forward labs to her PCP.

## 2021-07-11 NOTE — Progress Notes (Signed)
RNP and lupus anticoagulant are positive.  All other autoimmune labs are negative.  I would repeat this test in 3 months.  Please refer to nephrology for the evaluation of elevated creatinine.

## 2021-07-12 ENCOUNTER — Other Ambulatory Visit: Payer: Self-pay | Admitting: *Deleted

## 2021-07-12 ENCOUNTER — Other Ambulatory Visit: Payer: Self-pay

## 2021-07-12 ENCOUNTER — Encounter (HOSPITAL_BASED_OUTPATIENT_CLINIC_OR_DEPARTMENT_OTHER): Payer: Medicaid Other | Attending: Internal Medicine | Admitting: Internal Medicine

## 2021-07-12 ENCOUNTER — Telehealth: Payer: Self-pay | Admitting: *Deleted

## 2021-07-12 DIAGNOSIS — L97522 Non-pressure chronic ulcer of other part of left foot with fat layer exposed: Secondary | ICD-10-CM | POA: Insufficient documentation

## 2021-07-12 DIAGNOSIS — M329 Systemic lupus erythematosus, unspecified: Secondary | ICD-10-CM | POA: Insufficient documentation

## 2021-07-12 DIAGNOSIS — L97512 Non-pressure chronic ulcer of other part of right foot with fat layer exposed: Secondary | ICD-10-CM | POA: Insufficient documentation

## 2021-07-12 DIAGNOSIS — R7989 Other specified abnormal findings of blood chemistry: Secondary | ICD-10-CM

## 2021-07-12 DIAGNOSIS — L932 Other local lupus erythematosus: Secondary | ICD-10-CM

## 2021-07-12 DIAGNOSIS — E11621 Type 2 diabetes mellitus with foot ulcer: Secondary | ICD-10-CM | POA: Diagnosis not present

## 2021-07-12 DIAGNOSIS — G8929 Other chronic pain: Secondary | ICD-10-CM | POA: Diagnosis not present

## 2021-07-12 DIAGNOSIS — E1151 Type 2 diabetes mellitus with diabetic peripheral angiopathy without gangrene: Secondary | ICD-10-CM | POA: Diagnosis not present

## 2021-07-12 DIAGNOSIS — I4891 Unspecified atrial fibrillation: Secondary | ICD-10-CM | POA: Diagnosis not present

## 2021-07-12 DIAGNOSIS — I5032 Chronic diastolic (congestive) heart failure: Secondary | ICD-10-CM | POA: Diagnosis not present

## 2021-07-12 DIAGNOSIS — I110 Hypertensive heart disease with heart failure: Secondary | ICD-10-CM | POA: Insufficient documentation

## 2021-07-12 LAB — COMPLETE METABOLIC PANEL WITH GFR
AG Ratio: 0.5 (calc) — ABNORMAL LOW (ref 1.0–2.5)
ALT: 9 U/L (ref 6–29)
AST: 25 U/L (ref 10–35)
Albumin: 3.3 g/dL — ABNORMAL LOW (ref 3.6–5.1)
Alkaline phosphatase (APISO): 103 U/L (ref 37–153)
BUN/Creatinine Ratio: 15 (calc) (ref 6–22)
BUN: 27 mg/dL — ABNORMAL HIGH (ref 7–25)
CO2: 25 mmol/L (ref 20–32)
Calcium: 9.4 mg/dL (ref 8.6–10.4)
Chloride: 97 mmol/L — ABNORMAL LOW (ref 98–110)
Creat: 1.8 mg/dL — ABNORMAL HIGH (ref 0.50–1.03)
Globulin: 6.5 g/dL (calc) — ABNORMAL HIGH (ref 1.9–3.7)
Glucose, Bld: 72 mg/dL (ref 65–99)
Potassium: 4 mmol/L (ref 3.5–5.3)
Sodium: 132 mmol/L — ABNORMAL LOW (ref 135–146)
Total Bilirubin: 0.3 mg/dL (ref 0.2–1.2)
Total Protein: 9.8 g/dL — ABNORMAL HIGH (ref 6.1–8.1)
eGFR: 32 mL/min/{1.73_m2} — ABNORMAL LOW (ref >=60)

## 2021-07-12 LAB — CBC WITH DIFFERENTIAL/PLATELET
Absolute Monocytes: 583 cells/uL (ref 200–950)
Basophils Absolute: 17 cells/uL (ref 0–200)
Basophils Relative: 0.2 %
Eosinophils Absolute: 17 cells/uL (ref 15–500)
Eosinophils Relative: 0.2 %
HCT: 24.1 % — ABNORMAL LOW (ref 35.0–45.0)
Hemoglobin: 7 g/dL — ABNORMAL LOW (ref 11.7–15.5)
Lymphs Abs: 783 cells/uL — ABNORMAL LOW (ref 850–3900)
MCH: 24.7 pg — ABNORMAL LOW (ref 27.0–33.0)
MCHC: 29 g/dL — ABNORMAL LOW (ref 32.0–36.0)
MCV: 85.2 fL (ref 80.0–100.0)
MPV: 10.1 fL (ref 7.5–12.5)
Monocytes Relative: 6.7 %
Neutro Abs: 7299 cells/uL (ref 1500–7800)
Neutrophils Relative %: 83.9 %
Platelets: 299 10*3/uL (ref 140–400)
RBC: 2.83 10*6/uL — ABNORMAL LOW (ref 3.80–5.10)
RDW: 16.4 % — ABNORMAL HIGH (ref 11.0–15.0)
Total Lymphocyte: 9 %
WBC: 8.7 10*3/uL (ref 3.8–10.8)

## 2021-07-12 LAB — CK: Total CK: 167 U/L — ABNORMAL HIGH (ref 29–143)

## 2021-07-12 LAB — PAN-ANCA
Myeloperoxidase Abs: <1 AI
Serine Protease 3: <1 AI

## 2021-07-12 LAB — CARDIOLIPIN ANTIBODIES, IGG, IGM, IGA
Anticardiolipin IgA: <2 APL-U/mL
Anticardiolipin IgG: <2 GPL-U/mL
Anticardiolipin IgM: <2 MPL-U/mL

## 2021-07-12 LAB — LUPUS ANTICOAGULANT EVAL W/ REFLEX
PTT-LA Screen: 42 s — ABNORMAL HIGH (ref <=40)
dRVVT: 33 s (ref <=45)

## 2021-07-12 LAB — SJOGRENS SYNDROME-B EXTRACTABLE NUCLEAR ANTIBODY: SSB (La) (ENA) Antibody, IgG: <1 AI

## 2021-07-12 LAB — BETA-2 GLYCOPROTEIN ANTIBODIES
Beta-2 Glyco 1 IgA: <2 U/mL
Beta-2 Glyco 1 IgM: <2 U/mL
Beta-2 Glyco I IgG: <2 U/mL

## 2021-07-12 LAB — URINALYSIS, ROUTINE W REFLEX MICROSCOPIC
Bilirubin Urine: NEGATIVE
Glucose, UA: NEGATIVE
Hgb urine dipstick: NEGATIVE
Ketones, ur: NEGATIVE
Leukocytes,Ua: NEGATIVE
Nitrite: NEGATIVE
Specific Gravity, Urine: 1.028 (ref 1.001–1.035)
pH: <=5 (ref 5.0–8.0)

## 2021-07-12 LAB — ANTI-SMITH ANTIBODY: ENA SM Ab Ser-aCnc: <1 AI

## 2021-07-12 LAB — ANTI-SCLERODERMA ANTIBODY: Scleroderma (Scl-70) (ENA) Antibody, IgG: <1 AI

## 2021-07-12 LAB — SEDIMENTATION RATE

## 2021-07-12 LAB — THROMBIN CLOTTING TIME: Thrombin Clotting Time: 19 s (ref 13–19)

## 2021-07-12 LAB — CRYOGLOBULIN: Cryoglobulin, Qualitative Analysis: NOT DETECTED

## 2021-07-12 LAB — RFLX HEXAGONAL PHASE CONFIRM: Hexagonal Phase Conf: POSITIVE — AB

## 2021-07-12 LAB — SJOGRENS SYNDROME-A EXTRACTABLE NUCLEAR ANTIBODY: SSA (Ro) (ENA) Antibody, IgG: <1 AI

## 2021-07-12 LAB — C3 AND C4
C3 Complement: 225 mg/dL — ABNORMAL HIGH (ref 83–193)
C4 Complement: 38 mg/dL (ref 15–57)

## 2021-07-12 LAB — ANTI-DNA ANTIBODY, DOUBLE-STRANDED: ds DNA Ab: 1 IU/mL

## 2021-07-12 LAB — MICROSCOPIC MESSAGE

## 2021-07-12 LAB — RNP ANTIBODY: Ribonucleic Protein(ENA) Antibody, IgG: >8 AI — AB

## 2021-07-12 LAB — ANA: Anti Nuclear Antibody (ANA): NEGATIVE

## 2021-07-12 NOTE — Telephone Encounter (Signed)
Patient contacted the office stating Candace Robinson called and reviewed her lab results with her. Patient states she was advised her Lupus Anticoagulant was positive. Patient states she was diagnosed with Cutaneous Lupus in 2006 and wanted to make sure this is what her labs show.  Attempted to contact the patient and left message for patient to call the office.

## 2021-07-12 NOTE — Progress Notes (Addendum)
Candace Robinson ([id number]) Visit Report for 07/12/2021 Chief Complaint Document Details Patient Name: Date of Service: [name], [name] 07/12/2021 2:00 PM Medical Record Number: [id number] Patient Account Number: [id number] Date of Birth/Sex: Treating RN: 1965-11-06 (56 y.o. [name] Primary Care Provider: [name] Other Clinician: Referring Provider: Treating Provider/Extender: Darcey Nora in Treatment: 20 Information Obtained from: Patient Chief Complaint Bilateral feet wounds Electronic Signature(s) Signed: 07/12/2021 3:42:32 PM By: Kalman Shan DO Entered By: Kalman Shan on 07/12/2021 15:32:38 -------------------------------------------------------------------------------- HPI Details Patient Name: Date of Service: BRO Candace Robinson, [name] 07/12/2021 2:00 PM Medical Record Number: [id number] Patient Account Number: [id number] Date of Birth/Sex: Treating RN: 06/16/1966 (56 y.o. [name] Primary Care Provider: [name] Other Clinician: Referring Provider: Treating Provider/Extender: Darcey Nora in Treatment: 20 History of Present Illness HPI Description: Admission 8/22 [name] is a 56 year old female with a past medical history of diet-controlled type 2 diabetes, hypothyroidism, chronic diastolic heart failure, cutaneous lupus erythematosus and essential hypertension that presents to the clinic for a 29-month history of worsening bilateral feet ulcers. She states that wounds to her feet have been an ongoing issue for several years. She states that with wound care they improve however they tend to wax and wane. 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 She was discharged to a nursing facility and she was provided wound care for her bilateral feet ulcers at that time. She states they used Silvadene and silver alginate. She states that when  she was discharged in April from the nursing facility her wounds had improved but were not fully closed. She currently uses Silvadene but has not been using silver alginate for several months due to running out. She reports significant decline in her wound healing over the last 4 months. She reports pain to the wound sites. She currently denies increased warmth erythema or purulent drainage. 8/29; patient presents for 1 week follow-up. She states she had ABIs completed without TBI's. She continues to use silver alginate with dressing changes. She reports continued pain. She denies signs of infection. 9/8; patient presents for 1 week follow-up. She was evaluated by Dr. Donzetta Matters and she reports follow-up with him in 1 month. She has been using Santyl to the wound beds. She reports some improvement in wound healing. She denies signs of infection. 9/15; patient presents for follow-up. She continues to use Santyl and silver alginate to the wound beds. She states the alginate is sticking and hard to remove with dressing changes. She currently denies signs of infection. 9/22; patient presents for 1 week follow-up. She is using Santyl and silver alginate to the wound beds. She ran out of [place] and has been using Silvadene. She states that gentamicin ointment is ready at her pharmacy and will be picking this up today. She has no issues or complaints today. She denies signs of infection. 9/30; she is using Santyl and silver alginate. She has substantial wounds on her bilateral feet and bilateral ankles that have been there for a year. She is in a lot of pain. She cannot bear to have these wounds touched let alone a mechanical debridement here. Furthermore the etiology of this is really not totally clear. She had reasonably normal ABIs with triphasic waveforms in August [I did not review this however]. She is completing a course of ciprofloxacin for an area on the left lateral ankle. She has had prior  ablations  and follows with vascular surgery. Finally she apparently has lupus 10/6; patient has been using Silvadene to the wound beds with dressing changes. She states she is picking up Santyl today. She has no issues or complaints today. She denies signs of infection. 10/17 the patient managed to get Santyl which the she is using to all wound beds. There is a large areas of breakdown here including substantially on the left distal foot, right foot involving the dorsal toes both medial ankles. All of these wounds are covered with some degree of very adherent fibrinous material. The patient did has an appointment with vascular surgery Dr. Donzetta Matters in 2 days time 10/24; patient presents for follow-up. She has been using Santyl and silver alginate to the wound beds. She states that she is going to be set up with rheumatology soon. She denies signs of infection. She overall reports stability in her wound healing. 11/7; patient presents for follow-up. She continues to use Santyl and silver alginate to the wound beds. She has an appointment set to see rheumatology on 07/06/2021. She reports some improvement in her wound healing. She currently denies signs of infection. 11/14; patient presents for follow-up. She has been using Santyl and silver alginate to the wound beds. She has no issues or complaints today. She denies signs of infection. 11/28; patient presents for follow-up. She has been using silver alginate and Santyl to the wound beds. She did not pick up Dakin's solution. She felt a burning sensation to her feet when it was applied in office. She states she picked up ciprofloxacin 1 week ago. And she is currently taking this. She currently denies signs of infection. 12/12; patient presents for follow-up. States she has been using Silvadene to the wound beds. She has chronic pain. She currently denies signs of infection. 1/9; patient presents for follow-up. She uses Silvadene to the wound beds and changes this up  with Dakin's wet-to-dry dressings. She has chronic pain. She states she would like to consider OR debridement at this time. She currently denies systemic signs of infection. Electronic Signature(s) Signed: 07/12/2021 3:42:32 PM By: Kalman Shan DO Entered By: Kalman Shan on 07/12/2021 15:33:19 -------------------------------------------------------------------------------- Physical Exam Details Patient Name: Date of Service: [name], [name] 07/12/2021 2:00 PM Medical Record Number: [id number] Patient Account Number: [id number] Date of Birth/Sex: Treating RN: 08/25/1965 (56 y.o. [name] Primary Care Provider: [name] Other Clinician: Referring Provider: Treating Provider/Extender: [name] Weeks in Treatment: 20 Constitutional respirations regular, non-labored and within target range for patient.. Cardiovascular 2+ dorsalis pedis/posterior tibialis pulses. Psychiatric pleasant and cooperative. Notes Wounds throughout the feet bilaterally with mostly nonviable tissue and scant granulation tissue present. No obvious signs of surrounding soft tissue infection. Electronic Signature(s) Signed: 07/12/2021 3:42:32 PM By: Kalman Shan DO Entered By: Kalman Shan on 07/12/2021 15:33:56 -------------------------------------------------------------------------------- Physician Orders Details Patient Name: Date of Service: [name], [name] 07/12/2021 2:00 PM Medical Record Number: [id number] Patient Account Number: [id number] Date of Birth/Sex: Treating RN: [birth year] (56 y.o. [name] Primary Care Provider: [name] Other Clinician: Referring Provider: Treating Provider/Extender: Darcey Nora in Treatment: 20 Verbal / Phone Orders: No Diagnosis Coding ICD-10 Coding Code Description [phone] Non-pressure chronic ulcer of other part of right foot with fat layer exposed L97.522  Non-pressure chronic ulcer of other part of left foot with fat layer exposed I73.9 Peripheral vascular disease, unspecified E11.9 Type 2 diabetes mellitus without complications  L93.2 Other local lupus erythematosus X83.38 Chronic diastolic (congestive) heart failure I10 Essential (primary) hypertension E11.621 Type 2 diabetes mellitus with foot ulcer Follow-up Appointments ppointment in 2 weeks. - with Dr. Heber Bixby *****EXTRA TIME - 74 MINUTES***** Return A Bathing/ Shower/ Hygiene May shower and wash wound with soap and water. - when dressings changed Edema Control - Lymphedema / SCD / Other Elevate legs to the level of the heart or above for 30 minutes daily and/or when sitting, a frequency of: - throughout the day Avoid standing for long periods of time. Additional Orders / Instructions Follow Nutritious Diet - 100-120g of protein daily Wound Treatment Wound #1 - Ankle Wound Laterality: Right, Medial Cleanser: Soap and Water Other:2 times per day/30 Days Discharge Instructions: May shower and wash wound with dial antibacterial soap and water prior to dressing change. Cleanser: Wound Cleanser Other:2 times per day/30 Days Discharge Instructions: Cleanse the wound with wound cleanser prior to applying a clean dressing using gauze sponges, not tissue or cotton balls. Topical: Dakin's Solution Other:2 times per day/30 Days Discharge Instructions: Apply Dakins moistened gauze to wounds Secondary Dressing: ABD Pad, 8x10 (Generic) Other:2 times per day/30 Days Discharge Instructions: Apply over primary dressing as directed. Secondary Dressing: NonWoven Sponge, 4x4 (in/in) (Generic) Other:2 times per day/30 Days Discharge Instructions: Apply over primary dressing as directed Secured With: Kerlix Roll Sterile, 4.5x3.1 (in/yd) Other:2 times per day/30 Days Discharge Instructions: Secure with Kerlix as directed. Secured With: Transpore Surgical Tape, 2x10 (in/yd) Other:2 times per day/30  Days Discharge Instructions: Secure dressing with tape as directed. Wound #2 - Foot Wound Laterality: Dorsal, Right Cleanser: Soap and Water Other:2 times per day/30 Days Discharge Instructions: May shower and wash wound with dial antibacterial soap and water prior to dressing change. Cleanser: Wound Cleanser Other:2 times per day/30 Days Discharge Instructions: Cleanse the wound with wound cleanser prior to applying a clean dressing using gauze sponges, not tissue or cotton balls. Topical: Dakin's Solution Other:2 times per day/30 Days Discharge Instructions: Apply Dakins moistened gauze to wounds Secondary Dressing: ABD Pad, 8x10 (Generic) Other:2 times per day/30 Days Discharge Instructions: Apply over primary dressing as directed. Secondary Dressing: NonWoven Sponge, 4x4 (in/in) (Generic) Other:2 times per day/30 Days Discharge Instructions: Apply over primary dressing as directed Secured With: Kerlix Roll Sterile, 4.5x3.1 (in/yd) Other:2 times per day/30 Days Discharge Instructions: Secure with Kerlix as directed. Secured With: Transpore Surgical Tape, 2x10 (in/yd) Other:2 times per day/30 Days Discharge Instructions: Secure dressing with tape as directed. Wound #3 - Ankle Wound Laterality: Left, Medial Cleanser: Soap and Water Other:2 times per day/30 Days Discharge Instructions: May shower and wash wound with dial antibacterial soap and water prior to dressing change. Cleanser: Wound Cleanser Other:2 times per day/30 Days Discharge Instructions: Cleanse the wound with wound cleanser prior to applying a clean dressing using gauze sponges, not tissue or cotton balls. Topical: Dakin's Solution Other:2 times per day/30 Days Discharge Instructions: Apply Dakins moistened gauze to wounds Secondary Dressing: ABD Pad, 8x10 (Generic) Other:2 times per day/30 Days Discharge Instructions: Apply over primary dressing as directed. Secondary Dressing: NonWoven Sponge, 4x4 (in/in) (Generic) Other:2  times per day/30 Days Discharge Instructions: Apply over primary dressing as directed Secured With: Kerlix Roll Sterile, 4.5x3.1 (in/yd) Other:2 times per day/30 Days Discharge Instructions: Secure with Kerlix as directed. Secured With: Transpore Surgical Tape, 2x10 (in/yd) Other:2 times per day/30 Days Discharge Instructions: Secure dressing with tape as directed. Wound #4 - Ankle Wound Laterality: Left, Lateral Cleanser: Soap and Water Other:2 times per  day/30 Days Discharge Instructions: May shower and wash wound with dial antibacterial soap and water prior to dressing change. Cleanser: Wound Cleanser Other:2 times per day/30 Days Discharge Instructions: Cleanse the wound with wound cleanser prior to applying a clean dressing using gauze sponges, not tissue or cotton balls. Topical: Dakin's Solution Other:2 times per day/30 Days Discharge Instructions: Apply Dakins moistened gauze to wounds Secondary Dressing: ABD Pad, 8x10 (Generic) Other:2 times per day/30 Days Discharge Instructions: Apply over primary dressing as directed. Secondary Dressing: NonWoven Sponge, 4x4 (in/in) (Generic) Other:2 times per day/30 Days Discharge Instructions: Apply over primary dressing as directed Secured With: Kerlix Roll Sterile, 4.5x3.1 (in/yd) Other:2 times per day/30 Days Discharge Instructions: Secure with Kerlix as directed. Secured With: Transpore Surgical Tape, 2x10 (in/yd) Other:2 times per day/30 Days Discharge Instructions: Secure dressing with tape as directed. Wound #5 - Foot Wound Laterality: Dorsal, Left Cleanser: Soap and Water Other:2 times per day/30 Days Discharge Instructions: May shower and wash wound with dial antibacterial soap and water prior to dressing change. Cleanser: Wound Cleanser Other:2 times per day/30 Days Discharge Instructions: Cleanse the wound with wound cleanser prior to applying a clean dressing using gauze sponges, not tissue or cotton balls. Topical: Dakin's  Solution Other:2 times per day/30 Days Discharge Instructions: Apply Dakins moistened gauze to wounds Secondary Dressing: ABD Pad, 8x10 (Generic) Other:2 times per day/30 Days Discharge Instructions: Apply over primary dressing as directed. Secondary Dressing: NonWoven Sponge, 4x4 (in/in) (Generic) Other:2 times per day/30 Days Discharge Instructions: Apply over primary dressing as directed Secured With: Kerlix Roll Sterile, 4.5x3.1 (in/yd) Other:2 times per day/30 Days Discharge Instructions: Secure with Kerlix as directed. Secured With: Transpore Surgical Tape, 2x10 (in/yd) Other:2 times per day/30 Days Discharge Instructions: Secure dressing with tape as directed. Wound #6 - Foot Wound Laterality: Left, Medial, Distal Cleanser: Soap and Water Other:2 times per day/30 Days Discharge Instructions: May shower and wash wound with dial antibacterial soap and water prior to dressing change. Cleanser: Wound Cleanser Other:2 times per day/30 Days Discharge Instructions: Cleanse the wound with wound cleanser prior to applying a clean dressing using gauze sponges, not tissue or cotton balls. Topical: Dakin's Solution Other:2 times per day/30 Days Discharge Instructions: Apply Dakins moistened gauze to wounds Secondary Dressing: ABD Pad, 8x10 (Generic) Other:2 times per day/30 Days Discharge Instructions: Apply over primary dressing as directed. Secondary Dressing: NonWoven Sponge, 4x4 (in/in) (Generic) Other:2 times per day/30 Days Discharge Instructions: Apply over primary dressing as directed Secured With: Kerlix Roll Sterile, 4.5x3.1 (in/yd) Other:2 times per day/30 Days Discharge Instructions: Secure with Kerlix as directed. Secured With: Transpore Surgical Tape, 2x10 (in/yd) Other:2 times per day/30 Days Discharge Instructions: Secure dressing with tape as directed. Wound #7 - Ankle Wound Laterality: Right, Lateral Cleanser: Soap and Water Other:2 times per day/30 Days Discharge  Instructions: May shower and wash wound with dial antibacterial soap and water prior to dressing change. Cleanser: Wound Cleanser Other:2 times per day/30 Days Discharge Instructions: Cleanse the wound with wound cleanser prior to applying a clean dressing using gauze sponges, not tissue or cotton balls. Topical: Dakin's Solution Other:2 times per day/30 Days Discharge Instructions: Apply Dakins moistened gauze to wounds Secondary Dressing: ABD Pad, 8x10 (Generic) Other:2 times per day/30 Days Discharge Instructions: Apply over primary dressing as directed. Secondary Dressing: NonWoven Sponge, 4x4 (in/in) (Generic) Other:2 times per day/30 Days Discharge Instructions: Apply over primary dressing as directed Secured With: Kerlix Roll Sterile, 4.5x3.1 (in/yd) Other:2 times per day/30 Days Discharge Instructions: Secure with Kerlix as directed.  Secured With: Transpore Surgical Tape, 2x10 (in/yd) Other:2 times per day/30 Days Discharge Instructions: Secure dressing with tape as directed. Consults Plastic Surgery - Dr. Claudia Desanctis - Multiple necrotic wounds on bilateral lower legs/ankles, evaluate for surgical debridement - (ICD10 L97.522 - Non- pressure chronic ulcer of other part of left foot with fat layer exposed) Pain Clinic - Uncontrolled pain due to necrotic, non-healing wounds on bilateral lower legs/ankles - (ICD10 L93.2 - Other local lupus erythematosus) Electronic Signature(s) Signed: 07/12/2021 3:42:32 PM By: Kalman Shan DO Entered By: Kalman Shan on 07/12/2021 15:34:26 Prescription 07/12/2021 -------------------------------------------------------------------------------- Minshall, Ruchel L. Kalman Shan DO Patient Name: Provider: 1966-03-24 [id number] Date of Birth: NPI#: [id number] Sex: DEA #: [phone] Phone #: License #: [place] Patient Address: [address] [place], [place] [address] [place], [place] [address] [phone] Allergies Motrin Provider's Orders Plastic Surgery - Dr. Claudia Desanctis - ICD10: Z00.923 - Multiple necrotic wounds on bilateral lower legs/ankles, evaluate for surgical debridement Hand Signature: Date(s): Prescription 07/12/2021 Mote, Freada L. Kalman Shan DO Patient Name: Provider: [birth year] [id number] Date of Birth: NPI#: [id number] Sex: DEA #: [phone] Phone #: License #: [place] Patient Address: [address] [place], [place] [address] [place], [place] [address] [phone] Allergies Motrin Provider's Orders [place]: L93.2 - Uncontrolled pain due to necrotic, non-healing wounds on bilateral lower legs/ankles Hand Signature: Date(s): Electronic Signature(s) Signed: 07/12/2021 3:42:32 PM By: Kalman Shan DO Entered By: Kalman Shan on 07/12/2021 15:34:29 -------------------------------------------------------------------------------- Problem List Details Patient Name: Date of Service: [name], [name] 07/12/2021 2:00 PM Medical Record Number: [id number] Patient Account Number: [id number] Date of Birth/Sex: Treating RN: [birth year] (56 y.o. [name] Primary Care Provider: [name] Other Clinician: Referring Provider: Treating Provider/Extender: Darcey Nora in Treatment: 20 Active Problems ICD-10 Encounter Code Description Active Date MDM Diagnosis [phone] Non-pressure chronic ulcer of other part of right foot with fat layer exposed 02/22/2021 No Yes L97.522 Non-pressure chronic ulcer of other part of left foot with fat layer exposed 02/22/2021 No Yes L93.2 Other local lupus erythematosus 02/22/2021 No Yes Q91.69 Chronic diastolic (congestive) heart failure 02/22/2021 No Yes I10 Essential (primary) hypertension 02/22/2021 No Yes E11.621 Type 2 diabetes mellitus with foot ulcer  02/22/2021 No Yes Inactive Problems Resolved Problems Electronic Signature(s) Signed: 07/12/2021 3:42:32 PM By: Kalman Shan DO Entered By: Kalman Shan on 07/12/2021 15:38:31 -------------------------------------------------------------------------------- Progress Note Details Patient Name: Date of Service: [name], [name] 07/12/2021 2:00 PM Medical Record Number: [id number] Patient Account Number: [id number] Date of Birth/Sex: Treating RN: 11/27/1965 (56 y.o. [name] Primary Care Provider: [name] Other Clinician: Referring Provider: Treating Provider/Extender: Darcey Nora in Treatment: 20 Subjective Chief Complaint Information obtained from Patient Bilateral feet wounds History of Present Illness (HPI) Admission 8/22 [name] is a 56 year old female with a past medical history of diet-controlled type 2 diabetes, hypothyroidism, chronic diastolic heart failure, cutaneous lupus erythematosus and essential hypertension that presents to the clinic for a 82-month history of worsening bilateral feet ulcers. She states that wounds to her feet have been an ongoing issue for several years. She states that with wound care they improve however they tend to wax and wane. 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 She was discharged to a nursing facility and  she was provided wound care for her bilateral feet ulcers at that time. She states they used Silvadene and silver alginate. She states that when she was discharged in April from the nursing facility her wounds had improved but were not fully closed. She currently uses Silvadene but has not been using silver alginate for several months due to running out. She reports significant decline in her wound healing over the last 4 months. She reports pain to the wound sites. She currently denies increased warmth erythema or purulent drainage. 8/29;  patient presents for 1 week follow-up. She states she had ABIs completed without TBI's. She continues to use silver alginate with dressing changes. She reports continued pain. She denies signs of infection. 9/8; patient presents for 1 week follow-up. She was evaluated by Dr. Donzetta Matters and she reports follow-up with him in 1 month. She has been using Santyl to the wound beds. She reports some improvement in wound healing. She denies signs of infection. 9/15; patient presents for follow-up. She continues to use Santyl and silver alginate to the wound beds. She states the alginate is sticking and hard to remove with dressing changes. She currently denies signs of infection. 9/22; patient presents for 1 week follow-up. She is using Santyl and silver alginate to the wound beds. She ran out of [place] and has been using Silvadene. She states that gentamicin ointment is ready at her pharmacy and will be picking this up today. She has no issues or complaints today. She denies signs of infection. 9/30; she is using Santyl and silver alginate. She has substantial wounds on her bilateral feet and bilateral ankles that have been there for a year. She is in a lot of pain. She cannot bear to have these wounds touched let alone a mechanical debridement here. Furthermore the etiology of this is really not totally clear. She had reasonably normal ABIs with triphasic waveforms in August [I did not review this however]. She is completing a course of ciprofloxacin for an area on the left lateral ankle. She has had prior ablations and follows with vascular surgery. Finally she apparently has lupus 10/6; patient has been using Silvadene to the wound beds with dressing changes. She states she is picking up Santyl today. She has no issues or complaints today. She denies signs of infection. 10/17 the patient managed to get Santyl which the she is using to all wound beds. There is a large areas of breakdown here including  substantially on the left distal foot, right foot involving the dorsal toes both medial ankles. All of these wounds are covered with some degree of very adherent fibrinous material. The patient did has an appointment with vascular surgery Dr. Donzetta Matters in 2 days time 10/24; patient presents for follow-up. She has been using Santyl and silver alginate to the wound beds. She states that she is going to be set up with rheumatology soon. She denies signs of infection. She overall reports stability in her wound healing. 11/7; patient presents for follow-up. She continues to use Santyl and silver alginate to the wound beds. She has an appointment set to see rheumatology on 07/06/2021. She reports some improvement in her wound healing. She currently denies signs of infection. 11/14; patient presents for follow-up. She has been using Santyl and silver alginate to the wound beds. She has no issues or complaints today. She denies signs of infection. 11/28; patient presents for follow-up. She has been using silver alginate and Santyl to the wound beds. She did not pick  up Dakin's solution. She felt a burning sensation to her feet when it was applied in office. She states she picked up ciprofloxacin 1 week ago. And she is currently taking this. She currently denies signs of infection. 12/12; patient presents for follow-up. States she has been using Silvadene to the wound beds. She has chronic pain. She currently denies signs of infection. 1/9; patient presents for follow-up. She uses Silvadene to the wound beds and changes this up with Dakin's wet-to-dry dressings. She has chronic pain. She states she would like to consider OR debridement at this time. She currently denies systemic signs of infection. Patient History Information obtained from Patient. Family History Unknown History. Social History Never smoker, Marital Status - Single, Alcohol Use - Never, Drug Use - No History, Caffeine Use - Rarely. Medical  History Hematologic/Lymphatic Patient has history of Anemia Cardiovascular Patient has history of Arrhythmia - A-Fib, Congestive Heart Failure, Hypertension Denies history of Angina Endocrine Patient has history of Type II Diabetes Immunological Patient has history of Lupus Erythematosus Medical A Surgical History Notes nd Endocrine Grave's disease Objective Constitutional respirations regular, non-labored and within target range for patient.. Vitals Time Taken: 2:15 PM, Height: 63 in, Weight: 157 lbs, BMI: 27.8, Temperature: 98.5 F, Pulse: 103 bpm, Respiratory Rate: 16 breaths/min, Blood Pressure: 112/71 mmHg. Cardiovascular 2+ dorsalis pedis/posterior tibialis pulses. Psychiatric pleasant and cooperative. General Notes: Wounds throughout the feet bilaterally with mostly nonviable tissue and scant granulation tissue present. No obvious signs of surrounding soft tissue infection. Integumentary (Hair, Skin) Wound #1 status is Open. Original cause of wound was Gradually Appeared. The date acquired was: 04/03/2020. The wound has been in treatment 20 weeks. The wound is located on the Right,Medial Ankle. The wound measures 7.3cm length x 6cm width x 0.5cm depth; 34.4cm^2 area and 17.2cm^3 volume. There is Fat Layer (Subcutaneous Tissue) exposed. There is no tunneling or undermining noted. There is a medium amount of purulent drainage noted. Foul odor after cleansing was noted. The wound margin is distinct with the outline attached to the wound base. There is small (1-33%) pink, pale granulation within the wound bed. There is a large (67-100%) amount of necrotic tissue within the wound bed including Eschar and Adherent Slough. Wound #2 status is Open. Original cause of wound was Gradually Appeared. The date acquired was: 04/03/2020. The wound has been in treatment 20 weeks. The wound is located on the Right,Dorsal Foot. The wound measures 7cm length x 5.5cm width x 0.1cm depth; 30.238cm^2  area and 3.024cm^3 volume. There is Fat Layer (Subcutaneous Tissue) exposed. There is no tunneling or undermining noted. There is a medium amount of purulent drainage noted. Foul odor after cleansing was noted. The wound margin is distinct with the outline attached to the wound base. There is small (1-33%) red granulation within the wound bed. There is a large (67-100%) amount of necrotic tissue within the wound bed including Eschar and Adherent Slough. Wound #3 status is Open. Original cause of wound was Gradually Appeared. The date acquired was: 02/22/2021. The wound has been in treatment 20 weeks. The wound is located on the Left,Medial Ankle. The wound measures 1.4cm length x 2.9cm width x 0.2cm depth; 3.189cm^2 area and 0.638cm^3 volume. There is Fat Layer (Subcutaneous Tissue) exposed. There is no tunneling or undermining noted. There is a medium amount of serosanguineous drainage noted. The wound margin is distinct with the outline attached to the wound base. There is small (1-33%) pink, pale granulation within the wound bed. There is a  large (67- 100%) amount of necrotic tissue within the wound bed including Adherent Slough. Wound #4 status is Open. Original cause of wound was Gradually Appeared. The date acquired was: 04/03/2020. The wound has been in treatment 20 weeks. The wound is located on the Left,Lateral Ankle. The wound measures 8.5cm length x 6.5cm width x 0.5cm depth; 43.393cm^2 area and 21.697cm^3 volume. There is Fat Layer (Subcutaneous Tissue) exposed. There is no tunneling or undermining noted. There is a medium amount of purulent drainage noted. The wound margin is distinct with the outline attached to the wound base. There is small (1-33%) red granulation within the wound bed. There is a large (67-100%) amount of necrotic tissue within the wound bed including Eschar and Adherent Slough. Wound #5 status is Open. Original cause of wound was Gradually Appeared. The date acquired  was: 04/03/2020. The wound has been in treatment 20 weeks. The wound is located on the Left,Dorsal Foot. The wound measures 9.5cm length x 8.5cm width x 0.4cm depth; 63.421cm^2 area and 25.368cm^3 volume. There is Fat Layer (Subcutaneous Tissue) exposed. There is no tunneling or undermining noted. There is a medium amount of purulent drainage noted. Foul odor after cleansing was noted. The wound margin is distinct with the outline attached to the wound base. There is no granulation within the wound bed. There is a large (67-100%) amount of necrotic tissue within the wound bed including Eschar and Adherent Slough. Wound #6 status is Open. Original cause of wound was Gradually Appeared. The date acquired was: 04/03/2020. The wound has been in treatment 20 weeks. The wound is located on the Left,Distal,Medial Foot. The wound measures 1.4cm length x 1.8cm width x 0.3cm depth; 1.979cm^2 area and 0.594cm^3 volume. There is Fat Layer (Subcutaneous Tissue) exposed. There is no tunneling or undermining noted. There is a small amount of serosanguineous drainage noted. The wound margin is distinct with the outline attached to the wound base. There is medium (34-66%) red granulation within the wound bed. There is a medium (34-66%) amount of necrotic tissue within the wound bed including Adherent Slough. Wound #7 status is Open. Original cause of wound was Gradually Appeared. The date acquired was: 04/08/2021. The wound has been in treatment 13 weeks. The wound is located on the Right,Lateral Ankle. The wound measures 1.9cm length x 1.9cm width x 0.1cm depth; 2.835cm^2 area and 0.284cm^3 volume. There is Fat Layer (Subcutaneous Tissue) exposed. There is no tunneling or undermining noted. There is a medium amount of serosanguineous drainage noted. The wound margin is distinct with the outline attached to the wound base. There is small (1-33%) pink granulation within the wound bed. There is a large (67- 100%) amount of  necrotic tissue within the wound bed including Adherent Slough. 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 I was hoping that the Dakin's wet-to-dry dressings would help remove some of the nonviable tissue. Unfortunately this is not the case. At this time I recommended OR debridement since she cannot tolerate sharp debridement in clinic. She was agreeable to this. We will refer to plastic surgery. Since she continues to have chronic pain I recommended a referral to the pain clinic and patient would like this. No signs of soft tissue infection. I also recommended potentially  using [facility] antibiotic and we obtained a PCR culture today.Follow-up in 2 weeks. For now she can use Dakin's wet-to-dry dressings or Silvadene. Plan Follow-up Appointments: Return Appointment in 2 weeks. - with Dr. Heber [employer] *****EXTRA TIME - 50 MINUTES***** Bathing/ Shower/ Hygiene: May shower and wash wound with soap and water. - when dressings changed Edema Control - Lymphedema / SCD / Other: Elevate legs to the level of the heart or above for 30 minutes daily and/or when sitting, a frequency of: - throughout the day Avoid standing for long periods of time. Additional Orders / Instructions: Follow Nutritious Diet - 100-120g of protein daily Consults ordered were: Plastic Surgery - Dr. Claudia Desanctis - Multiple necrotic wounds on bilateral lower legs/ankles, evaluate for surgical debridement, Pain Clinic - Uncontrolled pain due to necrotic, non-healing wounds on bilateral lower legs/ankles WOUND #1: - Ankle Wound Laterality: Right, Medial Cleanser: Soap and Water Other:2 times per day/30 Days Discharge  Instructions: May shower and wash wound with dial antibacterial soap and water prior to dressing change. Cleanser: Wound Cleanser Other:2 times per day/30 Days Discharge Instructions: Cleanse the wound with wound cleanser prior to applying a clean dressing using gauze sponges, not tissue or cotton balls. Topical: Dakin's Solution Other:2 times per day/30 Days Discharge Instructions: Apply Dakins moistened gauze to wounds Secondary Dressing: ABD Pad, 8x10 (Generic) Other:2 times per day/30 Days Discharge Instructions: Apply over primary dressing as directed. Secondary Dressing: NonWoven Sponge, 4x4 (in/in) (Generic) Other:2 times per day/30 Days Discharge Instructions: Apply over primary dressing as directed Secured With: Kerlix Roll Sterile, 4.5x3.1 (in/yd) Other:2 times per day/30 Days Discharge Instructions: Secure with Kerlix as directed. Secured With: Transpore Surgical T ape, 2x10 (in/yd) Other:2 times per day/30 Days Discharge Instructions: Secure dressing with tape as directed. WOUND #2: - Foot Wound Laterality: Dorsal, Right Cleanser: Soap and Water Other:2 times per day/30 Days Discharge Instructions: May shower and wash wound with dial antibacterial soap and water prior to dressing change. Cleanser: Wound Cleanser Other:2 times per day/30 Days Discharge Instructions: Cleanse the wound with wound cleanser prior to applying a clean dressing using gauze sponges, not tissue or cotton balls. Topical: Dakin's Solution Other:2 times per day/30 Days Discharge Instructions: Apply Dakins moistened gauze to wounds Secondary Dressing: ABD Pad, 8x10 (Generic) Other:2 times per day/30 Days Discharge Instructions: Apply over primary dressing as directed. Secondary Dressing: NonWoven Sponge, 4x4 (in/in) (Generic) Other:2 times per day/30 Days Discharge Instructions: Apply over primary dressing as directed Secured With: Kerlix Roll Sterile, 4.5x3.1 (in/yd) Other:2 times per day/30 Days Discharge  Instructions: Secure with Kerlix as directed. Secured With: Transpore Surgical T ape, 2x10 (in/yd) Other:2 times per day/30 Days Discharge Instructions: Secure dressing with tape as directed. WOUND #3: - Ankle Wound Laterality: Left, Medial Cleanser: Soap and Water Other:2 times per day/30 Days Discharge Instructions: May shower and wash wound with dial antibacterial soap and water prior to dressing change. Cleanser: Wound Cleanser Other:2 times per day/30 Days Discharge Instructions: Cleanse the wound with wound cleanser prior to applying a clean dressing using gauze sponges, not tissue or cotton balls. Topical: Dakin's Solution Other:2 times per day/30 Days Discharge Instructions: Apply Dakins moistened gauze to wounds Secondary Dressing: ABD Pad, 8x10 (Generic) Other:2 times per day/30 Days Discharge Instructions: Apply over primary dressing as directed. Secondary Dressing: NonWoven Sponge, 4x4 (in/in) (Generic) Other:2 times per day/30 Days Discharge Instructions: Apply over primary dressing as directed Secured With: Kerlix Roll Sterile, 4.5x3.1 (in/yd) Other:2 times per day/30 Days Discharge Instructions: Secure with Kerlix as directed. Secured  With: Transpore Surgical T ape, 2x10 (in/yd) Other:2 times per day/30 Days Discharge Instructions: Secure dressing with tape as directed. WOUND #4: - Ankle Wound Laterality: Left, Lateral Cleanser: Soap and Water Other:2 times per day/30 Days Discharge Instructions: May shower and wash wound with dial antibacterial soap and water prior to dressing change. Cleanser: Wound Cleanser Other:2 times per day/30 Days Discharge Instructions: Cleanse the wound with wound cleanser prior to applying a clean dressing using gauze sponges, not tissue or cotton balls. Topical: Dakin's Solution Other:2 times per day/30 Days Discharge Instructions: Apply Dakins moistened gauze to wounds Secondary Dressing: ABD Pad, 8x10 (Generic) Other:2 times per day/30  Days Discharge Instructions: Apply over primary dressing as directed. Secondary Dressing: NonWoven Sponge, 4x4 (in/in) (Generic) Other:2 times per day/30 Days Discharge Instructions: Apply over primary dressing as directed Secured With: Kerlix Roll Sterile, 4.5x3.1 (in/yd) Other:2 times per day/30 Days Discharge Instructions: Secure with Kerlix as directed. Secured With: Transpore Surgical T ape, 2x10 (in/yd) Other:2 times per day/30 Days Discharge Instructions: Secure dressing with tape as directed. WOUND #5: - Foot Wound Laterality: Dorsal, Left Cleanser: Soap and Water Other:2 times per day/30 Days Discharge Instructions: May shower and wash wound with dial antibacterial soap and water prior to dressing change. Cleanser: Wound Cleanser Other:2 times per day/30 Days Discharge Instructions: Cleanse the wound with wound cleanser prior to applying a clean dressing using gauze sponges, not tissue or cotton balls. Topical: Dakin's Solution Other:2 times per day/30 Days Discharge Instructions: Apply Dakins moistened gauze to wounds Secondary Dressing: ABD Pad, 8x10 (Generic) Other:2 times per day/30 Days Discharge Instructions: Apply over primary dressing as directed. Secondary Dressing: NonWoven Sponge, 4x4 (in/in) (Generic) Other:2 times per day/30 Days Discharge Instructions: Apply over primary dressing as directed Secured With: Kerlix Roll Sterile, 4.5x3.1 (in/yd) Other:2 times per day/30 Days Discharge Instructions: Secure with Kerlix as directed. Secured With: Transpore Surgical T ape, 2x10 (in/yd) Other:2 times per day/30 Days Discharge Instructions: Secure dressing with tape as directed. WOUND #6: - Foot Wound Laterality: Left, Medial, Distal Cleanser: Soap and Water Other:2 times per day/30 Days Discharge Instructions: May shower and wash wound with dial antibacterial soap and water prior to dressing change. Cleanser: Wound Cleanser Other:2 times per day/30 Days Discharge  Instructions: Cleanse the wound with wound cleanser prior to applying a clean dressing using gauze sponges, not tissue or cotton balls. Topical: Dakin's Solution Other:2 times per day/30 Days Discharge Instructions: Apply Dakins moistened gauze to wounds Secondary Dressing: ABD Pad, 8x10 (Generic) Other:2 times per day/30 Days Discharge Instructions: Apply over primary dressing as directed. Secondary Dressing: NonWoven Sponge, 4x4 (in/in) (Generic) Other:2 times per day/30 Days Discharge Instructions: Apply over primary dressing as directed Secured With: Kerlix Roll Sterile, 4.5x3.1 (in/yd) Other:2 times per day/30 Days Discharge Instructions: Secure with Kerlix as directed. Secured With: Transpore Surgical T ape, 2x10 (in/yd) Other:2 times per day/30 Days Discharge Instructions: Secure dressing with tape as directed. WOUND #7: - Ankle Wound Laterality: Right, Lateral Cleanser: Soap and Water Other:2 times per day/30 Days Discharge Instructions: May shower and wash wound with dial antibacterial soap and water prior to dressing change. Cleanser: Wound Cleanser Other:2 times per day/30 Days Discharge Instructions: Cleanse the wound with wound cleanser prior to applying a clean dressing using gauze sponges, not tissue or cotton balls. Topical: Dakin's Solution Other:2 times per day/30 Days Discharge Instructions: Apply Dakins moistened gauze to wounds Secondary Dressing: ABD Pad, 8x10 (Generic) Other:2 times per day/30 Days Discharge Instructions: Apply over primary dressing as directed. Secondary  Dressing: NonWoven Sponge, 4x4 (in/in) (Generic) Other:2 times per day/30 Days Discharge Instructions: Apply over primary dressing as directed Secured With: Kerlix Roll Sterile, 4.5x3.1 (in/yd) Other:2 times per day/30 Days Discharge Instructions: Secure with Kerlix as directed. Secured With: Transpore Surgical T ape, 2x10 (in/yd) Other:2 times per day/30 Days Discharge Instructions: Secure dressing  with tape as directed. 1. Referral to plastic surgery 2. Dakin's wet-to-dry dressings 3. Follow-up in 2 weeks 4. PCR culture Electronic Signature(s) Signed: 07/26/2021 3:19:43 PM By: Kalman Shan DO Previous Signature: 07/12/2021 3:42:32 PM Version By: Kalman Shan DO Entered By: Kalman Shan on 07/26/2021 15:19:20 -------------------------------------------------------------------------------- HxROS Details Patient Name: Date of Service: [name], [name] 07/12/2021 2:00 PM Medical Record Number: [id number] Patient Account Number: [id number] Date of Birth/Sex: Treating RN: [birth year] (56 y.o. [name] Primary Care Provider: Other Clinician: [name] Referring Provider: Treating Provider/Extender: Darcey Nora in Treatment: 20 Information Obtained From Patient Hematologic/Lymphatic Medical History: Positive for: Anemia Cardiovascular Medical History: Positive for: Arrhythmia - A-Fib; Congestive Heart Failure; Hypertension Negative for: Angina Endocrine Medical History: Positive for: Type II Diabetes Past Medical History Notes: Grave's disease Time with diabetes: over 10 years Treated with: Diet Blood sugar tested every day: No Immunological Medical History: Positive for: Lupus Erythematosus Immunizations Pneumococcal Vaccine: Received Pneumococcal Vaccination: No Implantable Devices None Family and Social History Unknown History: Yes; Never smoker; Marital Status - Single; Alcohol Use: Never; Drug Use: No History; Caffeine Use: Rarely; Financial Concerns: No; Food, Clothing or Shelter Needs: No; Support System Lacking: No; Transportation Concerns: No Electronic Signature(s) Signed: 07/12/2021 3:42:32 PM By: Kalman Shan DO Signed: 07/12/2021 4:35:23 PM By: Lorrin Jackson Entered By: Kalman Shan on 07/12/2021  15:33:30 -------------------------------------------------------------------------------- SuperBill Details Patient Name: Date of Service: [name], [name] 07/12/2021 Medical Record Number: [id number] Patient Account Number: [id number] Date of Birth/Sex: Treating RN: [birth year] (56 y.o. [name] Primary Care Provider: [name] Other Clinician: Referring Provider: Treating Provider/Extender: Darcey Nora in Treatment: 20 Diagnosis Coding ICD-10 Codes Code Description [phone] Non-pressure chronic ulcer of other part of right foot with fat layer exposed L97.522 Non-pressure chronic ulcer of other part of left foot with fat layer exposed I73.9 Peripheral vascular disease, unspecified E11.9 Type 2 diabetes mellitus without complications N05.3 Other local lupus erythematosus Z76.73 Chronic diastolic (congestive) heart failure I10 Essential (primary) hypertension E11.621 Type 2 diabetes mellitus with foot ulcer Facility Procedures CPT4 Code: [id number] Description: 40973 - WOUND CARE VISIT-LEV 5 EST PT Modifier: Quantity: 1 Physician Procedures : CPT4 Code Description Modifier [id number] - WC PHYS LEVEL 3 - EST PT ICD-10 Diagnosis Description L97.512 Non-pressure chronic ulcer of other part of right foot with fat layer exposed L97.522 Non-pressure chronic ulcer of other part of left foot  with fat layer exposed E11.621 Type 2 diabetes mellitus with foot ulcer L93.2 Other local lupus erythematosus Quantity: 1 Electronic Signature(s) Signed: 07/12/2021 4:41:04 PM By: Levan Hurst RN, BSN Signed: 07/13/2021 9:20:00 AM By: Kalman Shan DO Previous Signature: 07/12/2021 3:42:32 PM Version By: Kalman Shan DO Entered By: Levan Hurst on 07/12/2021 16:37:08

## 2021-07-12 NOTE — Progress Notes (Signed)
Candace Robinson, Candace Robinson ([id number]) Visit Report for 07/12/2021 Arrival Information Details Patient Name: Date of Service: Candace Robinson, [name] 07/12/2021 2:00 PM Medical Record Number: [id number] Patient Account Number: [id number] Date of Birth/Sex: Treating RN: 05/20/1966 (56 y.o. Candace Robinson, Candace Robinson Primary Care [name]: [name] Other Clinician: Referring [name]: Treating [name]/Extender: Darcey Nora in Treatment: 65 Visit Information History Since Last Visit Added or deleted any medications: No Patient Arrived: Candace Robinson Any new allergies or adverse reactions: No Arrival Time: 14:15 Had a fall or experienced change in No Accompanied By: alone activities of daily living that may affect Transfer Assistance: None risk of falls: Patient Identification Verified: Yes Signs or symptoms of abuse/neglect since last visito No Secondary Verification Process Completed: Yes Hospitalized since last visit: No Patient Requires Transmission-Based No Implantable device outside of the clinic excluding No Precautions: cellular tissue based products placed in the center Patient Has Alerts: Yes since last visit: Patient Alerts: R ABI: 1.0 L ABI: 1.1 Has Dressing in Place as Prescribed: Yes NO BENZOCAINE Pain Present Now: Yes USE LIDOCAINE GEL ONLY Electronic Signature(s) Signed: 07/12/2021 4:41:04 PM By: Levan Hurst RN, BSN Entered By: Levan Hurst on 07/12/2021 14:17:42 -------------------------------------------------------------------------------- Clinic Level of Care Assessment Details Patient Name: Date of Service: Candace Robinson, [name] 07/12/2021 2:00 PM Medical Record Number: [id number] Patient Account Number: [id number] Date of Birth/Sex: Treating RN: Candace Robinson (55 y.o. Candace Robinson Primary Care Onesti Bonfiglio: [name] Other Clinician: Referring [name]: Treating [name]/Extender: Darcey Nora in  Treatment: 20 Clinic Level of Care Assessment Items TOOL 4 Quantity Score Robinson- 1 0 Use when only an EandM is performed on FOLLOW-UP visit ASSESSMENTS - Nursing Assessment / Reassessment Robinson- 1 10 Reassessment of Co-morbidities (includes updates in patient status) Robinson- 1 5 Reassessment of Adherence to Treatment Plan ASSESSMENTS - Wound and Skin A ssessment / Reassessment []  - 0 Simple Wound Assessment / Reassessment - one wound Robinson- 6 5 Complex Wound Assessment / Reassessment - multiple wounds []  - 0 Dermatologic / Skin Assessment (not related to wound area) ASSESSMENTS - Focused Assessment []  - 0 Circumferential Edema Measurements - multi extremities []  - 0 Nutritional Assessment / Counseling / Intervention Robinson- 1 5 Lower Extremity Assessment (monofilament, tuning fork, pulses) []  - 0 Peripheral Arterial Disease Assessment (using hand held doppler) ASSESSMENTS - Ostomy and/or Continence Assessment and Care []  - 0 Incontinence Assessment and Management []  - 0 Ostomy Care Assessment and Management (repouching, etc.) PROCESS - Coordination of Care []  - 0 Simple Patient / Family Education for ongoing care Robinson- 1 20 Complex (extensive) Patient / Family Education for ongoing care []  - 0 Staff obtains Programmer, systems, Records, T Results / Process Orders est []  - 0 Staff telephones HHA, Nursing Homes / Clarify orders / etc []  - 0 Routine Transfer to another Facility (non-emergent condition) []  - 0 Routine Hospital Admission (non-emergent condition) []  - 0 New Admissions / Biomedical engineer / Ordering NPWT Apligraf, etc. , []  - 0 Emergency Hospital Admission (emergent condition) Robinson- 1 10 Simple Discharge Coordination []  - 0 Complex (extensive) Discharge Coordination PROCESS - Special Needs []  - 0 Pediatric / Minor Patient Management []  - 0 Isolation Patient Management []  - 0 Hearing / Language / Visual special needs []  - 0 Assessment of Community assistance (transportation,  D/C planning, etc.) []  - 0 Additional assistance / Altered mentation []  - 0 Support Surface(s) Assessment (bed, cushion, seat, etc.) INTERVENTIONS - Wound Cleansing /  Measurement []  - 0 Simple Wound Cleansing - one wound Robinson- 6 5 Complex Wound Cleansing - multiple wounds Robinson- 1 5 Wound Imaging (photographs - any number of wounds) []  - 0 Wound Tracing (instead of photographs) []  - 0 Simple Wound Measurement - one wound Robinson- 6 5 Complex Wound Measurement - multiple wounds INTERVENTIONS - Wound Dressings []  - 0 Small Wound Dressing one or multiple wounds Robinson- 2 15 Medium Wound Dressing one or multiple wounds []  - 0 Large Wound Dressing one or multiple wounds []  - 0 Application of Medications - topical []  - 0 Application of Medications - injection INTERVENTIONS - Miscellaneous []  - 0 External ear exam []  - 0 Specimen Collection (cultures, biopsies, blood, body fluids, etc.) []  - 0 Specimen(s) / Culture(s) sent or taken to Lab for analysis []  - 0 Patient Transfer (multiple staff / Civil Service fast streamer / Similar devices) []  - 0 Simple Staple / Suture removal (25 or less) []  - 0 Complex Staple / Suture removal (26 or more) []  - 0 Hypo / Hyperglycemic Management (close monitor of Blood Glucose) []  - 0 Ankle / Brachial Index (ABI) - do not check if billed separately Robinson- 1 5 Vital Signs Has the patient been seen at the hospital within the last three years: Yes Total Score: 180 Level Of Care: New/Established - Level 5 Electronic Signature(s) Signed: 07/12/2021 4:41:04 PM By: Levan Hurst RN, BSN Entered By: Levan Hurst on 07/12/2021 16:36:41 -------------------------------------------------------------------------------- Encounter Discharge Information Details Patient Name: Date of Service: Candace Robinson, [name] 07/12/2021 2:00 PM Medical Record Number: [id number] Patient Account Number: [id number] Date of Birth/Sex: Treating RN: June 14, 1966 (56 y.o. Candace Robinson Primary Care  [name]: [name] Other Clinician: Referring Karigan Cloninger: Treating [name]/Extender: Darcey Nora in Treatment: 20 Encounter Discharge Information Items Discharge Condition: Stable Ambulatory Status: Walker Discharge Destination: Home Transportation: Private Auto Accompanied By: alone Schedule Follow-up Appointment: Yes Clinical Summary of Care: Patient Declined Electronic Signature(s) Signed: 07/12/2021 4:41:04 PM By: Levan Hurst RN, BSN Entered By: Levan Hurst on 07/12/2021 16:38:06 -------------------------------------------------------------------------------- Multi Wound Chart Details Patient Name: Date of Service: Candace Robinson, [name] 07/12/2021 2:00 PM Medical Record Number: [id number] Patient Account Number: [id number] Date of Birth/Sex: Treating RN: July 27, 1965 (56 y.o. [name] Primary Care Katerina Zurn: [name] Other Clinician: Referring Parvin Stetzer: Treating Laurelle Skiver/Extender: [name] Weeks in Treatment: 20 Vital Signs Height(in): 63 Pulse(bpm): 103 Weight(lbs): 157 Blood Pressure(mmHg): 112/71 Body Mass Index(BMI): 28 Temperature(F): 98.5 Respiratory Rate(breaths/min): 16 Photos: [1:Right, Medial Ankle] [2:Right, Dorsal Foot] [3:Left, Medial Ankle] Wound Location: [1:Gradually Appeared] [2:Gradually Appeared] [3:Gradually Appeared] Wounding Event: [1:Diabetic Wound/Ulcer of the Lower] [2:Diabetic Wound/Ulcer of the Lower] [3:Diabetic Wound/Ulcer of the Lower] Primary Etiology: [1:Extremity Anemia, Arrhythmia, Congestive Heart Anemia, Arrhythmia, Congestive Heart Anemia, Arrhythmia, Congestive Heart] [2:Extremity] [3:Extremity] Comorbid History: [1:Failure, Hypertension, Type II Diabetes, Lupus Erythematosus 04/03/2020] [2:Failure, Hypertension, Type II Diabetes, Lupus Erythematosus 04/03/2020] [3:Failure, Hypertension, Type II Diabetes, Lupus Erythematosus 02/22/2021] Date Acquired:  [1:20] [2:20] [3:20] Weeks of Treatment: [1:Open] [2:Open] [3:Open] Wound Status: [1:7.3x6x0.5] [2:7x5.5x0.1] [3:1.4x2.9x0.2] Measurements [name] [name] D (cm) [1:34.4] [2:30.238] [3:3.189] A (cm) : rea [1:17.2] [2:3.024] [3:0.638] Volume (cm) : [1:-108.60%] [2:-156.70%] [3:-62.50%] % Reduction in A rea: [1:-421.40%] [2:-156.70%] [3:-62.30%] % Reduction in Volume: [1:Grade 2] [2:Grade 2] [3:Grade 2] Classification: [1:Medium] [2:Medium] [3:Medium] Exudate A mount: [1:Purulent] [2:Purulent] [3:Serosanguineous] Exudate Type: [1:yellow, brown, green] [2:yellow, brown, green] [3:red, brown] Exudate Color: [1:Yes] [2:Yes] [3:No] Foul Odor A Cleansing: [1:fter  No] [2:No] [3:N/A] Odor A nticipated Due to Product Use: [1:Distinct, outline attached] [2:Distinct, outline attached] [3:Distinct, outline attached] Wound Margin: [1:Small (1-33%)] [2:Small (1-33%)] [3:Small (1-33%)] Granulation A mount: [1:Pink, Pale] [2:Red] [3:Pink, Pale] Granulation Quality: [1:Large (67-100%)] [2:Large (67-100%)] [3:Large (67-100%)] Necrotic A mount: [1:Eschar, Adherent Slough] [2:Eschar, Adherent Slough] [3:Adherent Slough] Necrotic Tissue: [1:Fat Layer (Subcutaneous Tissue): Yes Fat Layer (Subcutaneous Tissue): Yes Fat Layer (Subcutaneous Tissue): Yes] Exposed Structures: [1:Fascia: No Tendon: No Muscle: No Joint: No Bone: No Small (1-33%)] [2:Fascia: No Tendon: No Muscle: No Joint: No Bone: No Small (1-33%)] [3:Fascia: No Tendon: No Muscle: No Joint: No Bone: No Small (1-33%)] Wound Number: 4 5 6  Photos: Left, Lateral Ankle Left, Dorsal Foot Left, Distal, Medial Foot Wound Location: Gradually Appeared Gradually Appeared Gradually Appeared Wounding Event: Diabetic Wound/Ulcer of the Lower Diabetic Wound/Ulcer of the Lower Diabetic Wound/Ulcer of the Lower Primary Etiology: Extremity Extremity Extremity Anemia, Arrhythmia, Congestive Heart Anemia, Arrhythmia, Congestive Heart Anemia, Arrhythmia, Congestive  Heart Comorbid History: Failure, Hypertension, Type II Failure, Hypertension, Type II Failure, Hypertension, Type II Diabetes, Lupus Erythematosus Diabetes, Lupus Erythematosus Diabetes, Lupus Erythematosus 04/03/2020 04/03/2020 04/03/2020 Date Acquired: 20 20 20  Weeks of Treatment: Open Open Open Wound Status: 8.5x6.5x0.5 9.5x8.5x0.4 1.4x1.8x0.3 Measurements [name] [name] D (cm) 43.393 63.421 1.979 A (cm) : rea 21.697 25.368 0.594 Volume (cm) : -15.10% -5.60% -530.30% % Reduction in A rea: -187.80% -322.20% -1816.10% % Reduction in Volume: Grade 2 Grade 2 Grade 2 Classification: Medium Medium Small Exudate A mount: Purulent Purulent Serosanguineous Exudate Type: yellow, brown, green yellow, brown, green red, brown Exudate Color: No Yes No Foul Odor A Cleansing: fter N/A No N/A Odor A nticipated Due to Product Use: Distinct, outline attached Distinct, outline attached Distinct, outline attached Wound Margin: Small (1-33%) None Present (0%) Medium (34-66%) Granulation A mount: Red N/A Red Granulation Quality: Large (67-100%) Large (67-100%) Medium (34-66%) Necrotic A mount: Eschar, Adherent Slough Eschar, Adherent [employer] Necrotic Tissue: Fat Layer (Subcutaneous Tissue): Yes Fat Layer (Subcutaneous Tissue): Yes Fat Layer (Subcutaneous Tissue): Yes Exposed Structures: Fascia: No Fascia: No Fascia: No Tendon: No Tendon: No Tendon: No Muscle: No Muscle: No Muscle: No Joint: No Joint: No Joint: No Bone: No Bone: No Bone: No None Small (1-33%) Medium (34-66%) Epithelialization: Wound Number: 7 N/A N/A Photos: Photos: N/A N/A Right, Lateral Ankle N/A N/A Wound Location: Gradually Appeared N/A N/A Wounding Event: Lupus N/A N/A Primary Etiology: Anemia, Arrhythmia, Congestive Heart N/A N/A Comorbid History: Failure, Hypertension, Type II Diabetes, Lupus Erythematosus 04/08/2021 N/A N/A Date Acquired: 13 N/A N/A Weeks of Treatment: Open  N/A N/A Wound Status: 1.9x1.9x0.1 N/A N/A Measurements [name] [name] D (cm) 2.835 N/A N/A A (cm) : rea 0.284 N/A N/A Volume (cm) : -9045.20% N/A N/A % Reduction in Area: -9366.70% N/A N/A % Reduction in Volume: Full Thickness Without Exposed N/A N/A Classification: Support Structures Medium N/A N/A Exudate A mount: Serosanguineous N/A N/A Exudate Type: red, brown N/A N/A Exudate Color: No N/A N/A Foul Odor A Cleansing: fter N/A N/A N/A Odor Anticipated Due to Product Use: Distinct, outline attached N/A N/A Wound Margin: Small (1-33%) N/A N/A Granulation A mount: Pink N/A N/A Granulation Quality: Large (67-100%) N/A N/A Necrotic Amount: Adherent Slough N/A N/A Necrotic Tissue: Fat Layer (Subcutaneous Tissue): Yes N/A N/A Exposed Structures: Fascia: No Tendon: No Muscle: No Joint: No Bone: No Small (1-33%) N/A N/A Epithelialization: Treatment Notes Electronic Signature(s) Signed: 07/12/2021 3:42:32 PM By: Kalman Shan DO Signed: 07/12/2021 4:35:23 PM By:  Lorrin Jackson Entered By: Kalman Shan on 07/12/2021 15:32:24 -------------------------------------------------------------------------------- Multi-Disciplinary Care Plan Details Patient Name: Date of Service: Candace Robinson, [name] 07/12/2021 2:00 PM Medical Record Number: [id number] Patient Account Number: [id number] Date of Birth/Sex: Treating RN: 05/04/1966 (56 y.o. Candace Robinson Primary Care Dayelin Balducci: [name] Other Clinician: Referring Yesli Vanderhoff: Treating Ansley Stanwood/Extender: [name] Weeks in Treatment: 20 Active Inactive Wound/Skin Impairment Nursing Diagnoses: Impaired tissue integrity Goals: Patient/caregiver will verbalize understanding of skin care regimen Date Initiated: 02/22/2021 Target Resolution Date: 07/30/2021 Goal Status: Active Interventions: Assess patient/caregiver ability to obtain necessary supplies Assess patient/caregiver ability to  perform ulcer/skin care regimen upon admission and as needed Assess ulceration(s) every visit Provide education on ulcer and skin care Treatment Activities: Topical wound management initiated : 02/22/2021 Notes: 03/25/21: All wounds not yet at 30%, culture positive. Using gent cream. 05/31/21: Wound regimen continues, patient has not been compliant with all aspects of wound treatment. Electronic Signature(s) Signed: 07/12/2021 4:41:04 PM By: Levan Hurst RN, BSN Entered By: Levan Hurst on 07/12/2021 14:55:35 -------------------------------------------------------------------------------- Pain Assessment Details Patient Name: Date of Service: Candace Robinson, [name] 07/12/2021 2:00 PM Medical Record Number: [id number] Patient Account Number: [id number] Date of Birth/Sex: Treating RN: 13-Sep-1965 (56 y.o. Candace Robinson Primary Care Pryce Folts: [name] Other Clinician: Referring Ersa Delaney: Treating Tamaira Ciriello/Extender: Darcey Nora in Treatment: 20 Active Problems Location of Pain Severity and Description of Pain Patient Has Paino Yes Site Locations Pain Location: Pain in Ulcers With Dressing Change: Yes Rate the pain. Current Pain Level: 6 Character of Pain Describe the Pain: Burning, Throbbing Pain Management and Medication Current Pain Management: Medication: Yes Cold Application: No Rest: No Massage: No Activity: No T.E.N.S.: No Heat Application: No Leg drop or elevation: No Is the Current Pain Management Adequate: Adequate How does your wound impact your activities of daily livingo Sleep: No Bathing: No Appetite: No Relationship With Others: No Bladder Continence: No Emotions: No Bowel Continence: No Work: No Toileting: No Drive: No Dressing: No Hobbies: No Electronic Signature(s) Signed: 07/12/2021 4:41:04 PM By: Levan Hurst RN, BSN Entered By: Levan Hurst on 07/12/2021  14:19:23 -------------------------------------------------------------------------------- Patient/Caregiver Education Details Patient Name: Date of Service: Candace Robinson, [name] 1/9/2023andnbsp2:00 PM Medical Record Number: [id number] Patient Account Number: [id number] Date of Birth/Gender: Treating RN: [birth year] (56 y.o. Candace Robinson Primary Care Physician: [name] Other Clinician: Referring Physician: Treating Physician/Extender: Darcey Nora in Treatment: 20 Education Assessment Education Provided To: Patient Education Topics Provided Wound/Skin Impairment: Methods: Explain/Verbal Responses: State content correctly [employer]) Signed: 07/12/2021 4:41:04 PM By: Levan Hurst RN, BSN Entered By: Levan Hurst on 07/12/2021 14:55:53 -------------------------------------------------------------------------------- Wound Assessment Details Patient Name: Date of Service: Candace Robinson, [name] 07/12/2021 2:00 PM Medical Record Number: [id number] Patient Account Number: [id number] Date of Birth/Sex: Treating RN: 04-10-1966 (56 y.o. Candace Robinson Primary Care Aoki Wedemeyer: [name] Other Clinician: Referring Margrett Kalb: Treating Yashira Offenberger/Extender: [name] Weeks in Treatment: 20 Wound Status Wound Number: 1 Primary Diabetic Wound/Ulcer of the Lower Extremity Etiology: Wound Location: Right, Medial Ankle Wound Open Wounding Event: Gradually Appeared Status: Date Acquired: 04/03/2020 Comorbid Anemia, Arrhythmia, Congestive Heart Failure, Hypertension, Weeks Of Treatment: 20 History: Type II Diabetes, Lupus Erythematosus Clustered Wound: No Photos Wound Measurements Length: (cm) 7.3 Width: (cm) 6 Depth: (cm) 0.5 Area: (cm) 34.4 Volume: (cm) 17.2 % Reduction in Area: -108.6% % Reduction in Volume: -421.4% Epithelialization:  Small (1-33%) Tunneling: No Undermining: No Wound  Description Classification: Grade 2 Wound Margin: Distinct, outline attached Exudate Amount: Medium Exudate Type: Purulent Exudate Color: yellow, brown, green Foul Odor After Cleansing: Yes Due to Product Use: No Slough/Fibrino Yes Wound Bed Granulation Amount: Small (1-33%) Exposed Structure Granulation Quality: Pink, Pale Fascia Exposed: No Necrotic Amount: Large (67-100%) Fat Layer (Subcutaneous Tissue) Exposed: Yes Necrotic Quality: Eschar, Adherent Slough Tendon Exposed: No Muscle Exposed: No Joint Exposed: No Bone Exposed: No Treatment Notes Wound #1 (Ankle) Wound Laterality: Right, Medial Cleanser Soap and Water Discharge Instruction: May shower and wash wound with dial antibacterial soap and water prior to dressing change. Wound Cleanser Discharge Instruction: Cleanse the wound with wound cleanser prior to applying a clean dressing using gauze sponges, not tissue or cotton balls. Peri-Wound Care Topical Dakin's Solution Discharge Instruction: Apply Dakins moistened gauze to wounds Primary Dressing Secondary Dressing ABD Pad, 8x10 Discharge Instruction: Apply over primary dressing as directed. NonWoven Sponge, 4x4 (in/in) Discharge Instruction: Apply over primary dressing as directed Secured With Kerlix Roll Sterile, 4.5x3.1 (in/yd) Discharge Instruction: Secure with Kerlix as directed. Transpore Surgical Tape, 2x10 (in/yd) Discharge Instruction: Secure dressing with tape as directed. Compression Wrap Compression Stockings Add-Ons Electronic Signature(s) Signed: 07/12/2021 4:41:04 PM By: Levan Hurst RN, BSN Signed: 07/12/2021 4:41:04 PM By: Levan Hurst RN, BSN Entered By: Levan Hurst on 07/12/2021 14:35:31 -------------------------------------------------------------------------------- Wound Assessment Details Patient Name: Date of Service: Candace Robinson, [name] 07/12/2021 2:00 PM Medical Record Number: [id number] Patient Account Number:  [id number] Date of Birth/Sex: Treating RN: [birth year] (56 y.o. Candace Robinson Primary Care Shanyia Stines: [name] Other Clinician: Referring Jayleon Mcfarlane: Treating Shaquisha Wynn/Extender: [name] Weeks in Treatment: 20 Wound Status Wound Number: 2 Primary Diabetic Wound/Ulcer of the Lower Extremity Etiology: Wound Location: Right, Dorsal Foot Wound Open Wounding Event: Gradually Appeared Status: Date Acquired: 04/03/2020 Comorbid Anemia, Arrhythmia, Congestive Heart Failure, Hypertension, Weeks Of Treatment: 20 History: Type II Diabetes, Lupus Erythematosus Clustered Wound: No Photos Wound Measurements Length: (cm) 7 Width: (cm) 5.5 Depth: (cm) 0.1 Area: (cm) 30.238 Volume: (cm) 3.024 % Reduction in Area: -156.7% % Reduction in Volume: -156.7% Epithelialization: Small (1-33%) Tunneling: No Undermining: No Wound Description Classification: Grade 2 Wound Margin: Distinct, outline attached Exudate Amount: Medium Exudate Type: Purulent Exudate Color: yellow, brown, green Foul Odor After Cleansing: Yes Due to Product Use: No Slough/Fibrino Yes Wound Bed Granulation Amount: Small (1-33%) Exposed Structure Granulation Quality: Red Fascia Exposed: No Necrotic Amount: Large (67-100%) Fat Layer (Subcutaneous Tissue) Exposed: Yes Necrotic Quality: Eschar, Adherent Slough Tendon Exposed: No Muscle Exposed: No Joint Exposed: No Bone Exposed: No Treatment Notes Wound #2 (Foot) Wound Laterality: Dorsal, Right Cleanser Soap and Water Discharge Instruction: May shower and wash wound with dial antibacterial soap and water prior to dressing change. Wound Cleanser Discharge Instruction: Cleanse the wound with wound cleanser prior to applying a clean dressing using gauze sponges, not tissue or cotton balls. Peri-Wound Care Topical Dakin's Solution Discharge Instruction: Apply Dakins moistened gauze to wounds Primary Dressing Secondary  Dressing ABD Pad, 8x10 Discharge Instruction: Apply over primary dressing as directed. NonWoven Sponge, 4x4 (in/in) Discharge Instruction: Apply over primary dressing as directed Secured With Kerlix Roll Sterile, 4.5x3.1 (in/yd) Discharge Instruction: Secure with Kerlix as directed. Transpore Surgical Tape, 2x10 (in/yd) Discharge Instruction: Secure dressing with tape as directed. Compression Wrap Compression Stockings Add-Ons Electronic Signature(s) Signed: 07/12/2021 4:41:04 PM By: Levan Hurst RN, BSN Entered By: Levan Hurst on 07/12/2021 14:36:24 -------------------------------------------------------------------------------- Wound Assessment Details  Patient Name: Date of Service: Candace Robinson, [name] 07/12/2021 2:00 PM Medical Record Number: [id number] Patient Account Number: [id number] Date of Birth/Sex: Treating RN: 07/23/1965 (56 y.o. Candace Robinson Primary Care Morrisa Aldaba: [name] Other Clinician: Referring Caleyah Jr: Treating Reggie Bise/Extender: [name] Weeks in Treatment: 20 Wound Status Wound Number: 3 Primary Diabetic Wound/Ulcer of the Lower Extremity Etiology: Wound Location: Left, Medial Ankle Wound Open Wounding Event: Gradually Appeared Status: Date Acquired: 02/22/2021 Comorbid Anemia, Arrhythmia, Congestive Heart Failure, Hypertension, Weeks Of Treatment: 20 History: Type II Diabetes, Lupus Erythematosus Clustered Wound: No Photos Wound Measurements Length: (cm) 1.4 Width: (cm) 2.9 Depth: (cm) 0.2 Area: (cm) 3.189 Volume: (cm) 0.638 % Reduction in Area: -62.5% % Reduction in Volume: -62.3% Epithelialization: Small (1-33%) Tunneling: No Undermining: No Wound Description Classification: Grade 2 Wound Margin: Distinct, outline attached Exudate Amount: Medium Exudate Type: Serosanguineous Exudate Color: red, brown Foul Odor After Cleansing: No Slough/Fibrino Yes Wound Bed Granulation Amount: Small  (1-33%) Exposed Structure Granulation Quality: Pink, Pale Fascia Exposed: No Necrotic Amount: Large (67-100%) Fat Layer (Subcutaneous Tissue) Exposed: Yes Necrotic Quality: Adherent Slough Tendon Exposed: No Muscle Exposed: No Joint Exposed: No Bone Exposed: No Treatment Notes Wound #3 (Ankle) Wound Laterality: Left, Medial Cleanser Soap and Water Discharge Instruction: May shower and wash wound with dial antibacterial soap and water prior to dressing change. Wound Cleanser Discharge Instruction: Cleanse the wound with wound cleanser prior to applying a clean dressing using gauze sponges, not tissue or cotton balls. Peri-Wound Care Topical Dakin's Solution Discharge Instruction: Apply Dakins moistened gauze to wounds Primary Dressing Secondary Dressing ABD Pad, 8x10 Discharge Instruction: Apply over primary dressing as directed. NonWoven Sponge, 4x4 (in/in) Discharge Instruction: Apply over primary dressing as directed Secured With Kerlix Roll Sterile, 4.5x3.1 (in/yd) Discharge Instruction: Secure with Kerlix as directed. Transpore Surgical Tape, 2x10 (in/yd) Discharge Instruction: Secure dressing with tape as directed. Compression Wrap Compression Stockings Add-Ons Electronic Signature(s) Signed: 07/12/2021 4:41:04 PM By: Levan Hurst RN, BSN Entered By: Levan Hurst on 07/12/2021 14:37:09 -------------------------------------------------------------------------------- Wound Assessment Details Patient Name: Date of Service: Candace Robinson, [name] 07/12/2021 2:00 PM Medical Record Number: [id number] Patient Account Number: [id number] Date of Birth/Sex: Treating RN: Jul 25, 1965 (56 y.o. Candace Robinson Primary Care Rien Marland: [name] Other Clinician: Referring Aalia Greulich: Treating Mykalah Saari/Extender: [name] Weeks in Treatment: 20 Wound Status Wound Number: 4 Primary Diabetic Wound/Ulcer of the Lower Extremity Etiology: Wound  Location: Left, Lateral Ankle Wound Open Wounding Event: Gradually Appeared Status: Date Acquired: 04/03/2020 Comorbid Anemia, Arrhythmia, Congestive Heart Failure, Hypertension, Weeks Of Treatment: 20 History: Type II Diabetes, Lupus Erythematosus Clustered Wound: No Photos Wound Measurements Length: (cm) 8.5 Width: (cm) 6.5 Depth: (cm) 0.5 Area: (cm) 43.393 Volume: (cm) 21.697 % Reduction in Area: -15.1% % Reduction in Volume: -187.8% Epithelialization: None Tunneling: No Undermining: No Wound Description Classification: Grade 2 Wound Margin: Distinct, outline attached Exudate Amount: Medium Exudate Type: Purulent Exudate Color: yellow, brown, green Foul Odor After Cleansing: No Slough/Fibrino Yes Wound Bed Granulation Amount: Small (1-33%) Exposed Structure Granulation Quality: Red Fascia Exposed: No Necrotic Amount: Large (67-100%) Fat Layer (Subcutaneous Tissue) Exposed: Yes Necrotic Quality: Eschar, Adherent Slough Tendon Exposed: No Muscle Exposed: No Joint Exposed: No Bone Exposed: No Treatment Notes Wound #4 (Ankle) Wound Laterality: Left, Lateral Cleanser Soap and Water Discharge Instruction: May shower and wash wound with dial antibacterial soap and water prior to dressing change. Wound Cleanser Discharge Instruction: Cleanse the wound with wound cleanser  prior to applying a clean dressing using gauze sponges, not tissue or cotton balls. Peri-Wound Care Topical Dakin's Solution Discharge Instruction: Apply Dakins moistened gauze to wounds Primary Dressing Secondary Dressing ABD Pad, 8x10 Discharge Instruction: Apply over primary dressing as directed. NonWoven Sponge, 4x4 (in/in) Discharge Instruction: Apply over primary dressing as directed Secured With Kerlix Roll Sterile, 4.5x3.1 (in/yd) Discharge Instruction: Secure with Kerlix as directed. Transpore Surgical Tape, 2x10 (in/yd) Discharge Instruction: Secure dressing with tape as  directed. Compression Wrap Compression Stockings Add-Ons Electronic Signature(s) Signed: 07/12/2021 4:41:04 PM By: Levan Hurst RN, BSN Entered By: Levan Hurst on 07/12/2021 14:38:17 -------------------------------------------------------------------------------- Wound Assessment Details Patient Name: Date of Service: Candace Robinson, [name] 07/12/2021 2:00 PM Medical Record Number: [id number] Patient Account Number: [id number] Date of Birth/Sex: Treating RN: May 13, 1966 (56 y.o. Candace Robinson Primary Care Ommie Degeorge: [name] Other Clinician: Referring Vertis Scheib: Treating Jocelynn Gioffre/Extender: [name] Weeks in Treatment: 20 Wound Status Wound Number: 5 Primary Diabetic Wound/Ulcer of the Lower Extremity Etiology: Wound Location: Left, Dorsal Foot Wound Open Wounding Event: Gradually Appeared Status: Date Acquired: 04/03/2020 Comorbid Anemia, Arrhythmia, Congestive Heart Failure, Hypertension, Weeks Of Treatment: 20 History: Type II Diabetes, Lupus Erythematosus Clustered Wound: No Photos Wound Measurements Length: (cm) 9.5 Width: (cm) 8.5 Depth: (cm) 0.4 Area: (cm) 63.421 Volume: (cm) 25.368 % Reduction in Area: -5.6% % Reduction in Volume: -322.2% Epithelialization: Small (1-33%) Tunneling: No Undermining: No Wound Description Classification: Grade 2 Wound Margin: Distinct, outline attached Exudate Amount: Medium Exudate Type: Purulent Exudate Color: yellow, brown, green Foul Odor After Cleansing: Yes Due to Product Use: No Slough/Fibrino Yes Wound Bed Granulation Amount: None Present (0%) Exposed Structure Necrotic Amount: Large (67-100%) Fascia Exposed: No Necrotic Quality: Eschar, Adherent Slough Fat Layer (Subcutaneous Tissue) Exposed: Yes Tendon Exposed: No Muscle Exposed: No Joint Exposed: No Bone Exposed: No Treatment Notes Wound #5 (Foot) Wound Laterality: Dorsal, Left Cleanser Soap and Water Discharge  Instruction: May shower and wash wound with dial antibacterial soap and water prior to dressing change. Wound Cleanser Discharge Instruction: Cleanse the wound with wound cleanser prior to applying a clean dressing using gauze sponges, not tissue or cotton balls. Peri-Wound Care Topical Dakin's Solution Discharge Instruction: Apply Dakins moistened gauze to wounds Primary Dressing Secondary Dressing ABD Pad, 8x10 Discharge Instruction: Apply over primary dressing as directed. NonWoven Sponge, 4x4 (in/in) Discharge Instruction: Apply over primary dressing as directed Secured With Kerlix Roll Sterile, 4.5x3.1 (in/yd) Discharge Instruction: Secure with Kerlix as directed. Transpore Surgical Tape, 2x10 (in/yd) Discharge Instruction: Secure dressing with tape as directed. Compression Wrap Compression Stockings Add-Ons Electronic Signature(s) Signed: 07/12/2021 4:41:04 PM By: Levan Hurst RN, BSN Entered By: Levan Hurst on 07/12/2021 14:38:56 -------------------------------------------------------------------------------- Wound Assessment Details Patient Name: Date of Service: Candace Robinson, [name] 07/12/2021 2:00 PM Medical Record Number: [id number] Patient Account Number: [id number] Date of Birth/Sex: Treating RN: 1965-12-17 (56 y.o. Candace Robinson Primary Care Ruthene Methvin: [name] Other Clinician: Referring Derrius Furtick: Treating Adilene Areola/Extender: [name] Weeks in Treatment: 20 Wound Status Wound Number: 6 Primary Diabetic Wound/Ulcer of the Lower Extremity Etiology: Wound Location: Left, Distal, Medial Foot Wound Open Wounding Event: Gradually Appeared Status: Date Acquired: 04/03/2020 Comorbid Anemia, Arrhythmia, Congestive Heart Failure, Hypertension, Weeks Of Treatment: 20 History: Type II Diabetes, Lupus Erythematosus Clustered Wound: No Photos Wound Measurements Length: (cm) 1.4 Width: (cm) 1.8 Depth: (cm) 0.3 Area: (cm)  1.979 Volume: (cm) 0.594 % Reduction in Area: -530.3% % Reduction in Volume: -1816.1% Epithelialization:  Medium (34-66%) Tunneling: No Undermining: No Wound Description Classification: Grade 2 Wound Margin: Distinct, outline attached Exudate Amount: Small Exudate Type: Serosanguineous Exudate Color: red, brown Foul Odor After Cleansing: No Slough/Fibrino Yes Wound Bed Granulation Amount: Medium (34-66%) Exposed Structure Granulation Quality: Red Fascia Exposed: No Necrotic Amount: Medium (34-66%) Fat Layer (Subcutaneous Tissue) Exposed: Yes Necrotic Quality: Adherent Slough Tendon Exposed: No Muscle Exposed: No Joint Exposed: No Bone Exposed: No Treatment Notes Wound #6 (Foot) Wound Laterality: Left, Medial, Distal Cleanser Soap and Water Discharge Instruction: May shower and wash wound with dial antibacterial soap and water prior to dressing change. Wound Cleanser Discharge Instruction: Cleanse the wound with wound cleanser prior to applying a clean dressing using gauze sponges, not tissue or cotton balls. Peri-Wound Care Topical Dakin's Solution Discharge Instruction: Apply Dakins moistened gauze to wounds Primary Dressing Secondary Dressing ABD Pad, 8x10 Discharge Instruction: Apply over primary dressing as directed. NonWoven Sponge, 4x4 (in/in) Discharge Instruction: Apply over primary dressing as directed Secured With Kerlix Roll Sterile, 4.5x3.1 (in/yd) Discharge Instruction: Secure with Kerlix as directed. Transpore Surgical Tape, 2x10 (in/yd) Discharge Instruction: Secure dressing with tape as directed. Compression Wrap Compression Stockings Add-Ons Electronic Signature(s) Signed: 07/12/2021 4:41:04 PM By: Levan Hurst RN, BSN Signed: 07/12/2021 4:41:04 PM By: Levan Hurst RN, BSN Entered By: Levan Hurst on 07/12/2021 14:39:40 -------------------------------------------------------------------------------- Wound Assessment Details Patient  Name: Date of Service: Candace Robinson, [name] 07/12/2021 2:00 PM Medical Record Number: [id number] Patient Account Number: [id number] Date of Birth/Sex: Treating RN: 1966/06/10 (56 y.o. Candace Robinson Primary Care Hoby Kawai: [name] Other Clinician: Referring Melysa Schroyer: Treating Viera Okonski/Extender: [name] Weeks in Treatment: 20 Wound Status Wound Number: 7 Primary Lupus Etiology: Wound Location: Right, Lateral Ankle Wound Open Wounding Event: Gradually Appeared Status: Date Acquired: 04/08/2021 Comorbid Anemia, Arrhythmia, Congestive Heart Failure, Hypertension, Weeks Of Treatment: 13 History: Type II Diabetes, Lupus Erythematosus Clustered Wound: No Photos Wound Measurements Length: (cm) 1.9 Width: (cm) 1.9 Depth: (cm) 0.1 Area: (cm) 2.835 Volume: (cm) 0.284 % Reduction in Area: -9045.2% % Reduction in Volume: -9366.7% Epithelialization: Small (1-33%) Tunneling: No Undermining: No Wound Description Classification: Full Thickness Without Exposed Support Structures Wound Margin: Distinct, outline attached Exudate Amount: Medium Exudate Type: Serosanguineous Exudate Color: red, brown Foul Odor After Cleansing: No Slough/Fibrino Yes Wound Bed Granulation Amount: Small (1-33%) Exposed Structure Granulation Quality: Pink Fascia Exposed: No Necrotic Amount: Large (67-100%) Fat Layer (Subcutaneous Tissue) Exposed: Yes Necrotic Quality: Adherent Slough Tendon Exposed: No Muscle Exposed: No Joint Exposed: No Bone Exposed: No Treatment Notes Wound #7 (Ankle) Wound Laterality: Right, Lateral Cleanser Soap and Water Discharge Instruction: May shower and wash wound with dial antibacterial soap and water prior to dressing change. Wound Cleanser Discharge Instruction: Cleanse the wound with wound cleanser prior to applying a clean dressing using gauze sponges, not tissue or cotton balls. Peri-Wound Care Topical Dakin's  Solution Discharge Instruction: Apply Dakins moistened gauze to wounds Primary Dressing Secondary Dressing ABD Pad, 8x10 Discharge Instruction: Apply over primary dressing as directed. NonWoven Sponge, 4x4 (in/in) Discharge Instruction: Apply over primary dressing as directed Secured With Kerlix Roll Sterile, 4.5x3.1 (in/yd) Discharge Instruction: Secure with Kerlix as directed. Transpore Surgical Tape, 2x10 (in/yd) Discharge Instruction: Secure dressing with tape as directed. Compression Wrap Compression Stockings Add-Ons Electronic Signature(s) Signed: 07/12/2021 4:41:04 PM By: Levan Hurst RN, BSN Entered By: Levan Hurst on 07/12/2021 14:40:23 -------------------------------------------------------------------------------- Vitals Details Patient Name: Date of Service: Candace Robinson, [name] 07/12/2021 2:00 PM Medical  Record Number: [id number] Patient Account Number: [id number] Date of Birth/Sex: Treating RN: [birth year] (56 y.o. Candace Robinson Primary Care Dontez Hauss: [name] Other Clinician: Referring Marti Acebo: Treating Tomie Spizzirri/Extender: [name] Weeks in Treatment: 20 Vital Signs Time Taken: 14:15 Temperature (F): 98.5 Height (in): 63 Pulse (bpm): 103 Weight (lbs): 157 Respiratory Rate (breaths/min): 16 Body Mass Index (BMI): 27.8 Blood Pressure (mmHg): 112/71 Reference Range: 80 - 120 mg / dl Electronic Signature(s) Signed: 07/12/2021 4:41:04 PM By: Levan Hurst RN, BSN Entered By: Levan Hurst on 07/12/2021 14:19:04

## 2021-07-13 NOTE — Progress Notes (Signed)
I will discuss results at the follow-up visit.

## 2021-07-13 NOTE — Telephone Encounter (Signed)
Yes ,she should come back to get a sedimentation rate.

## 2021-07-13 NOTE — Telephone Encounter (Signed)
Spoke with patient and advised her Lupus anticoagulant is positive. Patient advised that this result does not change the diagnosis. Patient states she was concerned as she has several family member who have systemic lupus and they have since passed away.  The sedimentation rate was cancelled as they did not have enough blood to run the test. Patient would like to know if she needs to come back to the office to have that redrawn. Please advise.

## 2021-07-13 NOTE — Telephone Encounter (Signed)
Patient advised Dr. Estanislado Pandy would like for her to come to the office to have the sedimentation rate drawn. Future order placed.

## 2021-07-13 NOTE — Addendum Note (Signed)
Addended by: Carole Binning on: 07/13/2021 12:50 PM   Modules accepted: Orders

## 2021-07-15 NOTE — Progress Notes (Deleted)
Office Visit Note  Patient: Candace Robinson             Date of Birth: 1966-01-26           MRN: [id number]             PCP: [name], MD Referring: [name], MD Visit Date: 07/27/2021 Occupation: @GUAROCC @  Subjective:  No chief complaint on file.   History of Present Illness: Candace Robinson is a 56 y.o. female ***   Activities of Daily Living:  Patient reports morning stiffness for *** {minute/hour:19697}.   Patient {ACTIONS;DENIES/REPORTS:21021675::"Denies"} nocturnal pain.  Difficulty dressing/grooming: {ACTIONS;DENIES/REPORTS:21021675::"Denies"} Difficulty climbing stairs: {ACTIONS;DENIES/REPORTS:21021675::"Denies"} Difficulty getting out of chair: {ACTIONS;DENIES/REPORTS:21021675::"Denies"} Difficulty using hands for taps, buttons, cutlery, and/or writing: {ACTIONS;DENIES/REPORTS:21021675::"Denies"}  No Rheumatology ROS completed.   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   Iron deficiency anemia due to dietary causes 09/18/2020   Chronic anemia 09/18/2020   GERD without esophagitis 09/18/2020   Controlled type 2 diabetes mellitus with circulatory disorder ([place]) 09/18/2020   Chronic diastolic heart failure ([place]) 09/18/2020   Aortic atherosclerosis ([place]) 09/18/2020   Vitamin D deficiency 09/18/2020   Folate deficiency 09/18/2020   Hypertension associated with type 2 diabetes mellitus ([place]) 09/18/2020   Wound infection    Other pancytopenia ([place])    Thyrotoxicosis 09/01/2020   Atrial fibrillation with RVR (HCC)    Ulcers of both lower extremities ([place])    Acute encephalopathy 08/31/2020    Past Medical History:  Diagnosis Date   Anemia    Atrial fibrillation (HCC)    CHF (congestive heart failure) (HCC)    Diabetes mellitus without complication (HCC)    Heart murmur    Hypertension    Lupus ([place])    Thyroid disease    hyperthyroid    Family History  Problem  Relation Age of Onset   Hypertension Mother    Diabetes Mother    COPD Mother    Glaucoma Mother    Cancer - Colon Father    Diabetes Sister    Glaucoma Sister    Uveitis Daughter    Lupus Cousin        systemic   Lupus Cousin        systemic   Lupus Cousin        cutaneous   Past Surgical History:  Procedure Laterality Date   ABLATION Right    approximately 2015. right lower leg   TONSILLECTOMY     age 58   Social History   Social History Narrative   Not on file   Immunization History  Administered Date(s) Administered   PFIZER(Purple Top)SARS-COV-2 Vaccination 09/12/2019, 06/30/2020     Objective: Vital Signs: There were no vitals taken for this visit.   Physical Exam   Musculoskeletal Exam: ***  CDAI Exam: CDAI Score: -- Patient Global: --; Provider Global: -- Swollen: --; Tender: -- Joint Exam 07/27/2021   No joint exam has been documented for this visit   There is currently no information documented on the homunculus. Go to the Rheumatology activity and complete the homunculus joint exam.  Investigation: No additional findings.  Imaging: No results found.  Recent Labs: Lab Results  Component Value Date   WBC 8.7 07/06/2021   HGB 7.0 (L) 07/06/2021   PLT 299 07/06/2021   NA 132 (L) 07/06/2021   K 4.0 07/06/2021   CL 97 (L) 07/06/2021   CO2 25 07/06/2021  GLUCOSE 72 07/06/2021   BUN 27 (H) 07/06/2021   CREATININE 1.80 (H) 07/06/2021   BILITOT 0.3 07/06/2021   ALKPHOS 146 (H) 09/10/2020   AST 25 07/06/2021   ALT 9 07/06/2021   PROT 9.8 (H) 07/06/2021   ALBUMIN 2.1 (L) 09/10/2020   CALCIUM 9.4 07/06/2021   July 06, 2021 UA showed 1+ protein, few epithelial cells and bacteria, ANA negative, RNP>8.0, (double-stranded DNA, SSA, SSB, [place], SCL 70 negative), C3-C4 normal, anticardiolipin antibody negative, beta-2 GP 1 negative, lupus anticoagulant positive, ANCA negative, MPO negative, serum proteinase 3 negative, cryoglobulins negative,  [id number]  Speciality Comments: No specialty comments available.  Procedures:  No procedures performed Allergies: Motrin [ibuprofen]   Assessment / Plan:     Visit Diagnoses: No diagnosis found.  Orders: No orders of the defined types were placed in this encounter.  No orders of the defined types were placed in this encounter.   Face-to-face time spent with patient was *** minutes. Greater than 50% of time was spent in counseling and coordination of care.  Follow-Up Instructions: No follow-ups on file.   Bo Merino, MD  Note - This record has been created using Editor, commissioning.  Chart creation errors have been sought, but may not always  have been located. Such creation errors do not reflect on  the standard of medical care.

## 2021-07-16 ENCOUNTER — Telehealth: Payer: Self-pay | Admitting: Rheumatology

## 2021-07-16 ENCOUNTER — Other Ambulatory Visit: Payer: Self-pay | Admitting: *Deleted

## 2021-07-16 DIAGNOSIS — L932 Other local lupus erythematosus: Secondary | ICD-10-CM

## 2021-07-16 LAB — SEDIMENTATION RATE: Sed Rate: 80 mm/h — ABNORMAL HIGH (ref 0–30)

## 2021-07-16 NOTE — Telephone Encounter (Signed)
error 

## 2021-07-17 LAB — T4, FREE: Free T4: 0.92 ng/dL (ref 0.82–1.77)

## 2021-07-17 LAB — TSH: TSH: 56.6 u[IU]/mL — ABNORMAL HIGH (ref 0.450–4.500)

## 2021-07-19 NOTE — Progress Notes (Signed)
Sed rate is still elevated most likely due to infection.  Please forward the results to her PCP and wound management.

## 2021-07-26 ENCOUNTER — Other Ambulatory Visit: Payer: Self-pay

## 2021-07-26 ENCOUNTER — Encounter (HOSPITAL_BASED_OUTPATIENT_CLINIC_OR_DEPARTMENT_OTHER): Payer: Medicaid Other | Admitting: Internal Medicine

## 2021-07-26 DIAGNOSIS — L97522 Non-pressure chronic ulcer of other part of left foot with fat layer exposed: Secondary | ICD-10-CM | POA: Diagnosis not present

## 2021-07-26 DIAGNOSIS — L97512 Non-pressure chronic ulcer of other part of right foot with fat layer exposed: Secondary | ICD-10-CM | POA: Diagnosis not present

## 2021-07-26 DIAGNOSIS — L932 Other local lupus erythematosus: Secondary | ICD-10-CM

## 2021-07-26 DIAGNOSIS — E11621 Type 2 diabetes mellitus with foot ulcer: Secondary | ICD-10-CM | POA: Diagnosis not present

## 2021-07-26 NOTE — Progress Notes (Signed)
Candace, Robinson ([id number]) Visit Report for 07/26/2021 Chief Complaint Document Details Patient Name: Date of Service: Candace Robinson, [name] 07/26/2021 2:00 PM Medical Record Number: [id number] Patient Account Number: [id number] Date of Birth/Sex: Treating RN: [birth year] (56 y.o. [name] Primary Care Provider: [name] Other Clinician: Referring Provider: Treating Provider/Extender: Darcey Nora in Treatment: 22 Information Obtained from: Patient Chief Complaint Bilateral feet wounds Electronic Signature(s) Signed: 07/26/2021 3:17:38 PM By: Kalman Shan DO Entered By: Kalman Shan on 07/26/2021 15:12:40 -------------------------------------------------------------------------------- HPI Details Patient Name: Date of Service: Candace Robinson, [name] 07/26/2021 2:00 PM Medical Record Number: [id number] Patient Account Number: [id number] Date of Birth/Sex: Treating RN: Feb 02, 1966 (56 y.o. [name] Primary Care Provider: [name] Other Clinician: Referring Provider: Treating Provider/Extender: Darcey Nora in Treatment: 22 History of Present Illness HPI Description: Admission 8/22 Candace Robinson is a 56 year old female with a past medical history of diet-controlled type 2 diabetes, hypothyroidism, chronic diastolic heart failure, cutaneous lupus erythematosus and essential hypertension that presents to the clinic for a 73-month history of worsening bilateral feet ulcers. She states that wounds to her feet have been an ongoing issue for several years. She states that with wound care they improve however they tend to wax and wane. 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 She was discharged to a nursing facility and she was provided wound care for her bilateral feet ulcers at that time. She states they used Silvadene and silver alginate. She states that  when she was discharged in April from the nursing facility her wounds had improved but were not fully closed. She currently uses Silvadene but has not been using silver alginate for several months due to running out. She reports significant decline in her wound healing over the last 4 months. She reports pain to the wound sites. She currently denies increased warmth erythema or purulent drainage. 8/29; patient presents for 1 week follow-up. She states she had ABIs completed without TBI's. She continues to use silver alginate with dressing changes. She reports continued pain. She denies signs of infection. 9/8; patient presents for 1 week follow-up. She was evaluated by Dr. Donzetta Matters and she reports follow-up with him in 1 month. She has been using Santyl to the wound beds. She reports some improvement in wound healing. She denies signs of infection. 9/15; patient presents for follow-up. She continues to use Santyl and silver alginate to the wound beds. She states the alginate is sticking and hard to remove with dressing changes. She currently denies signs of infection. 9/22; patient presents for 1 week follow-up. She is using Santyl and silver alginate to the wound beds. She ran out of [place] and has been using Silvadene. She states that gentamicin ointment is ready at her pharmacy and will be picking this up today. She has no issues or complaints today. She denies signs of infection. 9/30; she is using Santyl and silver alginate. She has substantial wounds on her bilateral feet and bilateral ankles that have been there for a year. She is in a lot of pain. She cannot bear to have these wounds touched let alone a mechanical debridement here. Furthermore the etiology of this is really not totally clear. She had reasonably normal ABIs with triphasic waveforms in August [I did not review this however]. She is completing a course of ciprofloxacin for an area on the left lateral ankle. She has had prior  ablations and follows with vascular surgery. Finally she apparently has lupus 10/6; patient has been using Silvadene to the wound beds with dressing changes. She states she is picking up Santyl today. She has no issues or complaints today. She denies signs of infection. 10/17 the patient managed to get Santyl which the she is using to all wound beds. There is a large areas of breakdown here including substantially on the left distal foot, right foot involving the dorsal toes both medial ankles. All of these wounds are covered with some degree of very adherent fibrinous material. The patient did has an appointment with vascular surgery Dr. Donzetta Matters in 2 days time 10/24; patient presents for follow-up. She has been using Santyl and silver alginate to the wound beds. She states that she is going to be set up with rheumatology soon. She denies signs of infection. She overall reports stability in her wound healing. 11/7; patient presents for follow-up. She continues to use Santyl and silver alginate to the wound beds. She has an appointment set to see rheumatology on 07/06/2021. She reports some improvement in her wound healing. She currently denies signs of infection. 11/14; patient presents for follow-up. She has been using Santyl and silver alginate to the wound beds. She has no issues or complaints today. She denies signs of infection. 11/28; patient presents for follow-up. She has been using silver alginate and Santyl to the wound beds. She did not pick up Dakin's solution. She felt a burning sensation to her feet when it was applied in office. She states she picked up ciprofloxacin 1 week ago. And she is currently taking this. She currently denies signs of infection. 12/12; patient presents for follow-up. States she has been using Silvadene to the wound beds. She has chronic pain. She currently denies signs of infection. 1/9; patient presents for follow-up. She uses Silvadene to the wound beds and changes  this up with Dakin's wet-to-dry dressings. She has chronic pain. She states she would like to consider OR debridement at this time. She currently denies systemic signs of infection. 1/23; patient presents for follow-up. She received her Keystone antibiotic and started this yesterday. She has no acute issues. She reports chronic pain to the wound sites. She is scheduled to see Dr. Claudia Desanctis on 2/1 for discussion of OR debridement. Electronic Signature(s) Signed: 07/26/2021 3:17:38 PM By: Kalman Shan DO Entered By: Kalman Shan on 07/26/2021 15:13:50 -------------------------------------------------------------------------------- Physical Exam Details Patient Name: Date of Service: [name], [name] 07/26/2021 2:00 PM Medical Record Number: [id number] Patient Account Number: [id number] Date of Birth/Sex: Treating RN: 08/01/1965 (56 y.o. [name] Primary Care Provider: [name] Other Clinician: Referring Provider: Treating Provider/Extender: [name] Weeks in Treatment: 22 Constitutional respirations regular, non-labored and within target range for patient.. Cardiovascular 2+ dorsalis pedis/posterior tibialis pulses. Psychiatric pleasant and cooperative. Notes Wounds throughout the feet bilaterally with mostly nonviable tissue and scant granulation tissue present. No obvious signs of surrounding soft tissue infection. Electronic Signature(s) Signed: 07/26/2021 3:17:38 PM By: Kalman Shan DO Entered By: Kalman Shan on 07/26/2021 15:14:12 -------------------------------------------------------------------------------- Physician Orders Details Patient Name: Date of Service: [name], [name] 07/26/2021 2:00 PM Medical Record Number: [id number] Patient Account Number: [id number] Date of Birth/Sex: Treating RN: 06/08/1966 (56 y.o. 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 - with Dr. Heber Bessemer *****EXTRA TIME - 87 MINUTES***** Return A Bathing/ Shower/ Hygiene May shower and wash wound with soap and water. - when dressings changed Edema Control - Lymphedema / SCD / Other Elevate legs to the level of the heart or above for 30 minutes daily and/or when sitting, a frequency of: - throughout the day Avoid standing for long periods of time. Additional Orders / Instructions Follow Nutritious Diet - 100-120g of protein daily Wound Treatment Wound #1 - Ankle Wound Laterality: Right, Medial Cleanser: Soap and Water 1 x Per Day/30 Days Discharge Instructions: May shower and wash wound with dial antibacterial soap and water prior to dressing change. Cleanser: Wound Cleanser 1 x Per Day/30 Days Discharge Instructions: Cleanse the wound with wound cleanser prior to applying a clean dressing using gauze sponges, not tissue or cotton balls. Prim Dressing: keystone antibiotic compound 1 x Per Day/30 Days ary Discharge Instructions: thin layer to wound bed Secondary Dressing: ABD Pad, 8x10 (Generic) 1 x Per Day/30 Days Discharge Instructions: Apply over primary dressing as directed. Secondary Dressing: NonWoven Sponge, 4x4 (in/in) (Generic) 1 x Per Day/30 Days Discharge Instructions: Apply over primary dressing as directed Secured With: Kerlix Roll Sterile, 4.5x3.1 (in/yd) 1 x Per Day/30 Days Discharge  Instructions: Secure with Kerlix as directed. Secured With: Transpore Surgical Tape, 2x10 (in/yd) 1 x Per Day/30 Days Discharge Instructions: Secure dressing with tape as directed. Wound #2 - Foot Wound Laterality: Dorsal, Right Cleanser: Soap and Water 1 x Per Day/30 Days Discharge Instructions: May shower and wash wound with dial antibacterial soap and water prior to dressing change. Cleanser: Wound Cleanser 1 x Per Day/30 Days Discharge Instructions: Cleanse the wound with wound cleanser prior to applying a clean dressing using gauze sponges, not tissue or cotton balls. Prim Dressing: keystone antibiotic compound 1 x Per Day/30 Days ary Discharge Instructions: thin layer to wound bed Secondary Dressing: ABD Pad, 8x10 (Generic) 1 x Per Day/30 Days Discharge Instructions: Apply over primary dressing as directed. Secondary Dressing: NonWoven Sponge, 4x4 (in/in) (Generic) 1 x Per Day/30 Days Discharge Instructions: Apply over primary dressing as directed Secured With: Kerlix Roll Sterile, 4.5x3.1 (in/yd) 1 x Per Day/30 Days Discharge Instructions: Secure with Kerlix as directed. Secured With: Transpore Surgical Tape, 2x10 (in/yd) 1 x Per Day/30 Days Discharge Instructions: Secure dressing with tape as directed. Wound #3 - Ankle Wound Laterality: Left, Medial Cleanser: Soap and Water 1 x Per Day/30 Days Discharge Instructions: May shower and wash wound with dial antibacterial soap and water prior to dressing change. Cleanser: Wound Cleanser 1 x Per Day/30 Days Discharge Instructions: Cleanse the wound with wound cleanser prior to applying a clean dressing using gauze sponges, not tissue or cotton balls. Prim Dressing: keystone antibiotic compound 1 x Per Day/30 Days ary Discharge Instructions: thin layer to wound bed Secondary Dressing: ABD Pad, 8x10 (Generic) 1 x Per Day/30 Days Discharge Instructions: Apply over primary dressing as directed. Secondary Dressing: NonWoven Sponge, 4x4  (in/in) (Generic) 1 x Per Day/30 Days Discharge Instructions: Apply over primary dressing as directed Secured With: Kerlix Roll Sterile, 4.5x3.1 (in/yd) 1 x Per Day/30 Days Discharge Instructions: Secure with Kerlix as directed. Secured  With: Transpore Surgical Tape, 2x10 (in/yd) 1 x Per Day/30 Days Discharge Instructions: Secure dressing with tape as directed. Wound #4 - Ankle Wound Laterality: Left, Lateral Cleanser: Soap and Water 1 x Per Day/30 Days Discharge Instructions: May shower and wash wound with dial antibacterial soap and water prior to dressing change. Cleanser: Wound Cleanser 1 x Per Day/30 Days Discharge Instructions: Cleanse the wound with wound cleanser prior to applying a clean dressing using gauze sponges, not tissue or cotton balls. Prim Dressing: keystone antibiotic compound 1 x Per Day/30 Days ary Discharge Instructions: thin layer to wound bed Secondary Dressing: ABD Pad, 8x10 (Generic) 1 x Per Day/30 Days Discharge Instructions: Apply over primary dressing as directed. Secondary Dressing: NonWoven Sponge, 4x4 (in/in) (Generic) 1 x Per Day/30 Days Discharge Instructions: Apply over primary dressing as directed Secured With: Kerlix Roll Sterile, 4.5x3.1 (in/yd) 1 x Per Day/30 Days Discharge Instructions: Secure with Kerlix as directed. Secured With: Transpore Surgical Tape, 2x10 (in/yd) 1 x Per Day/30 Days Discharge Instructions: Secure dressing with tape as directed. Wound #5 - Foot Wound Laterality: Dorsal, Left Cleanser: Soap and Water 1 x Per Day/30 Days Discharge Instructions: May shower and wash wound with dial antibacterial soap and water prior to dressing change. Cleanser: Wound Cleanser 1 x Per Day/30 Days Discharge Instructions: Cleanse the wound with wound cleanser prior to applying a clean dressing using gauze sponges, not tissue or cotton balls. Prim Dressing: keystone antibiotic compound 1 x Per Day/30 Days ary Discharge Instructions: thin layer to  wound bed Secondary Dressing: ABD Pad, 8x10 (Generic) 1 x Per Day/30 Days Discharge Instructions: Apply over primary dressing as directed. Secondary Dressing: NonWoven Sponge, 4x4 (in/in) (Generic) 1 x Per Day/30 Days Discharge Instructions: Apply over primary dressing as directed Secured With: Kerlix Roll Sterile, 4.5x3.1 (in/yd) 1 x Per Day/30 Days Discharge Instructions: Secure with Kerlix as directed. Secured With: Transpore Surgical Tape, 2x10 (in/yd) 1 x Per Day/30 Days Discharge Instructions: Secure dressing with tape as directed. Wound #6 - Foot Wound Laterality: Left, Medial, Distal Cleanser: Soap and Water 1 x Per Day/30 Days Discharge Instructions: May shower and wash wound with dial antibacterial soap and water prior to dressing change. Cleanser: Wound Cleanser 1 x Per Day/30 Days Discharge Instructions: Cleanse the wound with wound cleanser prior to applying a clean dressing using gauze sponges, not tissue or cotton balls. Prim Dressing: keystone antibiotic compound 1 x Per Day/30 Days ary Discharge Instructions: thin layer to wound bed Secondary Dressing: ABD Pad, 8x10 (Generic) 1 x Per Day/30 Days Discharge Instructions: Apply over primary dressing as directed. Secondary Dressing: NonWoven Sponge, 4x4 (in/in) (Generic) 1 x Per Day/30 Days Discharge Instructions: Apply over primary dressing as directed Secured With: Kerlix Roll Sterile, 4.5x3.1 (in/yd) 1 x Per Day/30 Days Discharge Instructions: Secure with Kerlix as directed. Secured With: Transpore Surgical Tape, 2x10 (in/yd) 1 x Per Day/30 Days Discharge Instructions: Secure dressing with tape as directed. Wound #7 - Ankle Wound Laterality: Right, Lateral Cleanser: Soap and Water 1 x Per Day/30 Days Discharge Instructions: May shower and wash wound with dial antibacterial soap and water prior to dressing change. Cleanser: Wound Cleanser 1 x Per Day/30 Days Discharge Instructions: Cleanse the wound with wound cleanser  prior to applying a clean dressing using gauze sponges, not tissue or cotton balls. Prim Dressing: keystone antibiotic compound 1 x Per Day/30 Days ary Discharge Instructions: thin layer to wound bed Secondary Dressing: ABD Pad, 8x10 (Generic) 1 x Per Day/30 Days Discharge Instructions: Apply over primary  dressing as directed. Secondary Dressing: NonWoven Sponge, 4x4 (in/in) (Generic) 1 x Per Day/30 Days Discharge Instructions: Apply over primary dressing as directed Secured With: Kerlix Roll Sterile, 4.5x3.1 (in/yd) 1 x Per Day/30 Days Discharge Instructions: Secure with Kerlix as directed. Secured With: Transpore Surgical Tape, 2x10 (in/yd) 1 x Per Day/30 Days Discharge Instructions: Secure dressing with tape as directed. Electronic Signature(s) Signed: 07/26/2021 3:17:38 PM By: Kalman Shan DO Entered By: Kalman Shan on 07/26/2021 15:14:40 -------------------------------------------------------------------------------- Problem List Details Patient Name: Date of Service: [name], [name] 07/26/2021 2:00 PM Medical Record Number: [id number] Patient Account Number: [id number] Date of Birth/Sex: Treating RN: 1965-10-11 (56 y.o. [name] Primary Care Provider: [name] Other Clinician: Referring Provider: Treating Provider/Extender: Darcey Nora in Treatment: 22 Active Problems ICD-10 Encounter Code Description Active Date MDM Diagnosis L97.512 Non-pressure chronic ulcer of other part of right foot with fat layer exposed 02/22/2021 No Yes L97.522 Non-pressure chronic ulcer of other part of left foot with fat layer exposed 02/22/2021 No Yes L93.2 Other local lupus erythematosus 02/22/2021 No Yes P10.25 Chronic diastolic (congestive) heart failure 02/22/2021 No Yes I10 Essential (primary) hypertension 02/22/2021 No Yes E11.621 Type 2 diabetes mellitus with foot ulcer 02/22/2021 No Yes Inactive Problems Resolved  Problems Electronic Signature(s) Signed: 07/26/2021 3:17:38 PM By: Kalman Shan DO Entered By: Kalman Shan on 07/26/2021 15:12:24 -------------------------------------------------------------------------------- Progress Note Details Patient Name: Date of Service: [name], [name] 07/26/2021 2:00 PM Medical Record Number: [id number] Patient Account Number: [id number] Date of Birth/Sex: Treating RN: [birth year] (56 y.o. [name] Primary Care Provider: [name] Other Clinician: Referring Provider: Treating Provider/Extender: Darcey Nora in Treatment: 22 Subjective Chief Complaint Information obtained from Patient Bilateral feet wounds History of Present Illness (HPI) Admission 8/22 [name] is a 56 year old female with a past medical history of diet-controlled type 2 diabetes, hypothyroidism, chronic diastolic heart failure, cutaneous lupus erythematosus and essential hypertension that presents to the clinic for a 66-month history of worsening bilateral feet ulcers. She states that wounds to her feet have been an ongoing issue for several years. She states that with wound care they improve however they tend to wax and wane. 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 She was discharged to a nursing facility and she was provided wound care for her bilateral feet ulcers at that time. She states they used Silvadene and silver alginate. She states that when she was discharged in April from the nursing facility her wounds had improved but were not fully closed. She currently uses Silvadene but has not been using silver alginate for several months due to running out. She reports significant decline in her wound healing over the last 4 months. She reports pain to the wound sites. She currently denies increased warmth erythema or purulent drainage. 8/29; patient presents for 1 week follow-up. She states  she had ABIs completed without TBI's. She continues to use silver alginate with dressing changes. She reports continued pain. She denies signs of infection. 9/8; patient presents for 1 week follow-up. She was evaluated by Dr. Donzetta Matters and she reports follow-up with him in 1 month. She has been using Santyl to the wound beds. She reports some improvement in wound healing. She denies signs of infection. 9/15; patient presents for follow-up. She continues to use Santyl and silver alginate to the wound beds. She states the alginate is sticking and hard to remove with dressing  changes. She currently denies signs of infection. 9/22; patient presents for 1 week follow-up. She is using Santyl and silver alginate to the wound beds. She ran out of [place] and has been using Silvadene. She states that gentamicin ointment is ready at her pharmacy and will be picking this up today. She has no issues or complaints today. She denies signs of infection. 9/30; she is using Santyl and silver alginate. She has substantial wounds on her bilateral feet and bilateral ankles that have been there for a year. She is in a lot of pain. She cannot bear to have these wounds touched let alone a mechanical debridement here. Furthermore the etiology of this is really not totally clear. She had reasonably normal ABIs with triphasic waveforms in August [I did not review this however]. She is completing a course of ciprofloxacin for an area on the left lateral ankle. She has had prior ablations and follows with vascular surgery. Finally she apparently has lupus 10/6; patient has been using Silvadene to the wound beds with dressing changes. She states she is picking up Santyl today. She has no issues or complaints today. She denies signs of infection. 10/17 the patient managed to get Santyl which the she is using to all wound beds. There is a large areas of breakdown here including substantially on the left distal foot, right foot involving  the dorsal toes both medial ankles. All of these wounds are covered with some degree of very adherent fibrinous material. The patient did has an appointment with vascular surgery Dr. Donzetta Matters in 2 days time 10/24; patient presents for follow-up. She has been using Santyl and silver alginate to the wound beds. She states that she is going to be set up with rheumatology soon. She denies signs of infection. She overall reports stability in her wound healing. 11/7; patient presents for follow-up. She continues to use Santyl and silver alginate to the wound beds. She has an appointment set to see rheumatology on 07/06/2021. She reports some improvement in her wound healing. She currently denies signs of infection. 11/14; patient presents for follow-up. She has been using Santyl and silver alginate to the wound beds. She has no issues or complaints today. She denies signs of infection. 11/28; patient presents for follow-up. She has been using silver alginate and Santyl to the wound beds. She did not pick up Dakin's solution. She felt a burning sensation to her feet when it was applied in office. She states she picked up ciprofloxacin 1 week ago. And she is currently taking this. She currently denies signs of infection. 12/12; patient presents for follow-up. States she has been using Silvadene to the wound beds. She has chronic pain. She currently denies signs of infection. 1/9; patient presents for follow-up. She uses Silvadene to the wound beds and changes this up with Dakin's wet-to-dry dressings. She has chronic pain. She states she would like to consider OR debridement at this time. She currently denies systemic signs of infection. 1/23; patient presents for follow-up. She received her Keystone antibiotic and started this yesterday. She has no acute issues. She reports chronic pain to the wound sites. She is scheduled to see Dr. Claudia Desanctis on 2/1 for discussion of OR debridement. Patient History Information  obtained from Patient. Family History Unknown History. Social History Never smoker, Marital Status - Single, Alcohol Use - Never, Drug Use - No History, Caffeine Use - Rarely. Medical History Hematologic/Lymphatic Patient has history of Anemia Cardiovascular Patient has history of Arrhythmia - A-Fib, Congestive  Heart Failure, Hypertension Denies history of Angina Endocrine Patient has history of Type II Diabetes Immunological Patient has history of Lupus Erythematosus Medical A Surgical History Notes nd Endocrine Grave's disease Objective Constitutional respirations regular, non-labored and within target range for patient.. Vitals Time Taken: 2:10 PM, Height: 63 in, Source: Stated, Weight: 157 lbs, Source: Stated, BMI: 27.8, Temperature: 98.4 F, Pulse: 89 bpm, Respiratory Rate: 18 breaths/min, Blood Pressure: 123/78 mmHg. Cardiovascular 2+ dorsalis pedis/posterior tibialis pulses. Psychiatric pleasant and cooperative. General Notes: Wounds throughout the feet bilaterally with mostly nonviable tissue and scant granulation tissue present. No obvious signs of surrounding soft tissue infection. Integumentary (Hair, Skin) Wound #1 status is Open. Original cause of wound was Gradually Appeared. The date acquired was: 04/03/2020. The wound has been in treatment 22 weeks. The wound is located on the Right,Medial Ankle. The wound measures 7.5cm length x 6.6cm width x 0.3cm depth; 38.877cm^2 area and 11.663cm^3 volume. There is Fat Layer (Subcutaneous Tissue) exposed. There is no tunneling or undermining noted. There is a medium amount of serosanguineous drainage noted. Foul odor after cleansing was noted. The wound margin is distinct with the outline attached to the wound base. There is small (1-33%) red, pink granulation within the wound bed. There is a large (67-100%) amount of necrotic tissue within the wound bed including Eschar and Adherent Slough. Wound #2 status is Open. Original  cause of wound was Gradually Appeared. The date acquired was: 04/03/2020. The wound has been in treatment 22 weeks. The wound is located on the Right,Dorsal Foot. The wound measures 6.5cm length x 6.3cm width x 0.2cm depth; 32.162cm^2 area and 6.432cm^3 volume. There is Fat Layer (Subcutaneous Tissue) exposed. There is no tunneling or undermining noted. There is a medium amount of serosanguineous drainage noted. Foul odor after cleansing was noted. The wound margin is distinct with the outline attached to the wound base. There is small (1-33%) pink granulation within the wound bed. There is a large (67-100%) amount of necrotic tissue within the wound bed including Eschar and Adherent Slough. Wound #3 status is Open. Original cause of wound was Gradually Appeared. The date acquired was: 02/22/2021. The wound has been in treatment 22 weeks. The wound is located on the Left,Medial Ankle. The wound measures 1.3cm length x 2.8cm width x 0.1cm depth; 2.859cm^2 area and 0.286cm^3 volume. There is Fat Layer (Subcutaneous Tissue) exposed. There is no tunneling or undermining noted. There is a medium amount of serosanguineous drainage noted. The wound margin is distinct with the outline attached to the wound base. There is small (1-33%) pink, pale granulation within the wound bed. There is a large (67- 100%) amount of necrotic tissue within the wound bed including Adherent Slough. Wound #4 status is Open. Original cause of wound was Gradually Appeared. The date acquired was: 04/03/2020. The wound has been in treatment 22 weeks. The wound is located on the Left,Lateral Ankle. The wound measures 8.8cm length x 6.5cm width x 0.4cm depth; 44.925cm^2 area and 17.97cm^3 volume. There is Fat Layer (Subcutaneous Tissue) exposed. There is no tunneling or undermining noted. There is a medium amount of serosanguineous drainage noted. The wound margin is distinct with the outline attached to the wound base. There is small  (1-33%) red, pink granulation within the wound bed. There is a large (67- 100%) amount of necrotic tissue within the wound bed including Eschar and Adherent Slough. Wound #5 status is Open. Original cause of wound was Gradually Appeared. The date acquired was: 04/03/2020. The wound has been  in treatment 22 weeks. The wound is located on the Left,Dorsal Foot. The wound measures 9.6cm length x 9.3cm width x 0.4cm depth; 70.12cm^2 area and 28.048cm^3 volume. There is Fat Layer (Subcutaneous Tissue) exposed. There is no tunneling or undermining noted. There is a medium amount of serosanguineous drainage noted. Foul odor after cleansing was noted. The wound margin is distinct with the outline attached to the wound base. There is no granulation within the wound bed. There is a large (67-100%) amount of necrotic tissue within the wound bed including Eschar and Adherent Slough. Wound #6 status is Open. Original cause of wound was Gradually Appeared. The date acquired was: 04/03/2020. The wound has been in treatment 22 weeks. The wound is located on the Left,Distal,Medial Foot. The wound measures 2.1cm length x 1.2cm width x 0.1cm depth; 1.979cm^2 area and 0.198cm^3 volume. There is Fat Layer (Subcutaneous Tissue) exposed. There is no tunneling or undermining noted. There is a small amount of serosanguineous drainage noted. The wound margin is distinct with the outline attached to the wound base. There is no granulation within the wound bed. There is a large (67-100%) amount of necrotic tissue within the wound bed including Adherent Slough. Wound #7 status is Open. Original cause of wound was Gradually Appeared. The date acquired was: 04/08/2021. The wound has been in treatment 15 weeks. The wound is located on the Right,Lateral Ankle. The wound measures 1.8cm length x 1.8cm width x 0.2cm depth; 2.545cm^2 area and 0.509cm^3 volume. There is Fat Layer (Subcutaneous Tissue) exposed. There is no tunneling or  undermining noted. There is a medium amount of serosanguineous drainage noted. The wound margin is distinct with the outline attached to the wound base. There is small (1-33%) pink granulation within the wound bed. There is a large (67- 100%) amount of necrotic tissue within the wound bed including Adherent Slough. 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 Due to discomfort patient is unable to tolerate debridement. She is scheduled to see Dr. Claudia Desanctis on 2/1 to discuss the OR debridement. She was able to obtain the [facility] antibiotics from PCR culture done at last clinic visit. I recommended continuing this daily. No signs of surrounding infection on exam. Follow-up in 2 weeks. Plan Follow-up Appointments: Return Appointment in 2 weeks. - with Dr. Heber Alpine *****EXTRA TIME - 55 MINUTES***** Bathing/ Shower/ Hygiene: May shower and wash wound with soap and water. - when dressings changed Edema Control - Lymphedema / SCD / Other: Elevate legs to the level of the heart or above for 30 minutes daily and/or when sitting, a frequency of: - throughout the day Avoid standing for long periods of time. Additional Orders / Instructions: Follow Nutritious Diet - 100-120g of protein daily WOUND #1: - Ankle Wound Laterality: Right, Medial Cleanser: Soap and Water 1 x Per Day/30 Days Discharge Instructions: May shower and wash wound with dial antibacterial soap and water prior to dressing change. Cleanser: Wound Cleanser 1 x Per Day/30 Days Discharge Instructions: Cleanse the wound with wound cleanser prior to applying a clean dressing using gauze sponges, not tissue or cotton  balls. Prim Dressing: keystone antibiotic compound 1 x Per Day/30 Days ary Discharge Instructions: thin layer to wound bed Secondary Dressing: ABD Pad, 8x10 (Generic) 1  x Per Day/30 Days Discharge Instructions: Apply over primary dressing as directed. Secondary Dressing: NonWoven Sponge, 4x4 (in/in) (Generic) 1 x Per Day/30 Days Discharge Instructions: Apply over primary dressing as directed Secured With: Kerlix Roll Sterile, 4.5x3.1 (in/yd) 1 x Per Day/30 Days Discharge Instructions: Secure with Kerlix as directed. Secured With: Transpore Surgical T ape, 2x10 (in/yd) 1 x Per Day/30 Days Discharge Instructions: Secure dressing with tape as directed. WOUND #2: - Foot Wound Laterality: Dorsal, Right Cleanser: Soap and Water 1 x Per Day/30 Days Discharge Instructions: May shower and wash wound with dial antibacterial soap and water prior to dressing change. Cleanser: Wound Cleanser 1 x Per Day/30 Days Discharge Instructions: Cleanse the wound with wound cleanser prior to applying a clean dressing using gauze sponges, not tissue or cotton balls. Prim Dressing: keystone antibiotic compound 1 x Per Day/30 Days ary Discharge Instructions: thin layer to wound bed Secondary Dressing: ABD Pad, 8x10 (Generic) 1 x Per Day/30 Days Discharge Instructions: Apply over primary dressing as directed. Secondary Dressing: NonWoven Sponge, 4x4 (in/in) (Generic) 1 x Per Day/30 Days Discharge Instructions: Apply over primary dressing as directed Secured With: Kerlix Roll Sterile, 4.5x3.1 (in/yd) 1 x Per Day/30 Days Discharge Instructions: Secure with Kerlix as directed. Secured With: Transpore Surgical T ape, 2x10 (in/yd) 1 x Per Day/30 Days Discharge Instructions: Secure dressing with tape as directed. WOUND #3: - Ankle Wound Laterality: Left, Medial Cleanser: Soap and Water 1 x Per Day/30 Days Discharge Instructions: May shower and wash wound with dial antibacterial soap and water prior to dressing  change. Cleanser: Wound Cleanser 1 x Per Day/30 Days Discharge Instructions: Cleanse the wound with wound cleanser prior to applying a clean dressing using gauze sponges, not tissue or cotton balls. Prim Dressing: keystone antibiotic compound 1 x Per Day/30 Days ary Discharge Instructions: thin layer to wound bed Secondary Dressing: ABD Pad, 8x10 (Generic) 1 x Per Day/30 Days Discharge Instructions: Apply over primary dressing as directed. Secondary Dressing: NonWoven Sponge, 4x4 (in/in) (Generic) 1 x Per Day/30 Days Discharge Instructions: Apply over primary dressing as directed Secured With: Kerlix Roll Sterile, 4.5x3.1 (in/yd) 1 x Per Day/30 Days Discharge Instructions: Secure with Kerlix as directed. Secured With: Transpore Surgical T ape, 2x10 (in/yd) 1 x Per Day/30 Days Discharge Instructions: Secure dressing with tape as directed. WOUND #4: - Ankle Wound Laterality: Left, Lateral Cleanser: Soap and Water 1 x Per Day/30 Days Discharge Instructions: May shower and wash wound with dial antibacterial soap and water prior to dressing change. Cleanser: Wound Cleanser 1 x Per Day/30 Days Discharge Instructions: Cleanse the wound with wound cleanser prior to applying a clean dressing using gauze sponges, not tissue or cotton balls. Prim Dressing: keystone antibiotic compound 1 x Per Day/30 Days ary Discharge Instructions: thin layer to wound bed Secondary Dressing: ABD Pad, 8x10 (Generic) 1 x Per Day/30 Days Discharge Instructions: Apply over primary dressing as directed. Secondary Dressing: NonWoven Sponge, 4x4 (in/in) (Generic) 1 x Per Day/30 Days Discharge Instructions: Apply over primary dressing as directed Secured With: Kerlix Roll Sterile, 4.5x3.1 (in/yd) 1 x Per Day/30 Days Discharge Instructions: Secure with Kerlix as directed. Secured With: Transpore Surgical T ape, 2x10 (in/yd) 1 x Per Day/30 Days Discharge Instructions: Secure dressing with tape as directed. WOUND #5: - Foot  Wound Laterality: Dorsal, Left Cleanser: Soap and Water 1 x Per Day/30 Days Discharge Instructions: May shower and wash wound with dial antibacterial soap and water prior to dressing change. Cleanser: Wound Cleanser 1 x Per Day/30 Days Discharge  Instructions: Cleanse the wound with wound cleanser prior to applying a clean dressing using gauze sponges, not tissue or cotton balls. Prim Dressing: keystone antibiotic compound 1 x Per Day/30 Days ary Discharge Instructions: thin layer to wound bed Secondary Dressing: ABD Pad, 8x10 (Generic) 1 x Per Day/30 Days Discharge Instructions: Apply over primary dressing as directed. Secondary Dressing: NonWoven Sponge, 4x4 (in/in) (Generic) 1 x Per Day/30 Days Discharge Instructions: Apply over primary dressing as directed Secured With: Kerlix Roll Sterile, 4.5x3.1 (in/yd) 1 x Per Day/30 Days Discharge Instructions: Secure with Kerlix as directed. Secured With: Transpore Surgical T ape, 2x10 (in/yd) 1 x Per Day/30 Days Discharge Instructions: Secure dressing with tape as directed. WOUND #6: - Foot Wound Laterality: Left, Medial, Distal Cleanser: Soap and Water 1 x Per Day/30 Days Discharge Instructions: May shower and wash wound with dial antibacterial soap and water prior to dressing change. Cleanser: Wound Cleanser 1 x Per Day/30 Days Discharge Instructions: Cleanse the wound with wound cleanser prior to applying a clean dressing using gauze sponges, not tissue or cotton balls. Prim Dressing: keystone antibiotic compound 1 x Per Day/30 Days ary Discharge Instructions: thin layer to wound bed Secondary Dressing: ABD Pad, 8x10 (Generic) 1 x Per Day/30 Days Discharge Instructions: Apply over primary dressing as directed. Secondary Dressing: NonWoven Sponge, 4x4 (in/in) (Generic) 1 x Per Day/30 Days Discharge Instructions: Apply over primary dressing as directed Secured With: Kerlix Roll Sterile, 4.5x3.1 (in/yd) 1 x Per Day/30 Days Discharge  Instructions: Secure with Kerlix as directed. Secured With: Transpore Surgical T ape, 2x10 (in/yd) 1 x Per Day/30 Days Discharge Instructions: Secure dressing with tape as directed. WOUND #7: - Ankle Wound Laterality: Right, Lateral Cleanser: Soap and Water 1 x Per Day/30 Days Discharge Instructions: May shower and wash wound with dial antibacterial soap and water prior to dressing change. Cleanser: Wound Cleanser 1 x Per Day/30 Days Discharge Instructions: Cleanse the wound with wound cleanser prior to applying a clean dressing using gauze sponges, not tissue or cotton balls. Prim Dressing: keystone antibiotic compound 1 x Per Day/30 Days ary Discharge Instructions: thin layer to wound bed Secondary Dressing: ABD Pad, 8x10 (Generic) 1 x Per Day/30 Days Discharge Instructions: Apply over primary dressing as directed. Secondary Dressing: NonWoven Sponge, 4x4 (in/in) (Generic) 1 x Per Day/30 Days Discharge Instructions: Apply over primary dressing as directed Secured With: Kerlix Roll Sterile, 4.5x3.1 (in/yd) 1 x Per Day/30 Days Discharge Instructions: Secure with Kerlix as directed. Secured With: Transpore Surgical T ape, 2x10 (in/yd) 1 x Per Day/30 Days Discharge Instructions: Secure dressing with tape as directed. 1. Keystone antibiotic daily 2. Follow-up in 2 weeks Electronic Signature(s) Signed: 07/26/2021 3:17:38 PM By: Kalman Shan DO Entered By: Kalman Shan on 07/26/2021 15:16:51 -------------------------------------------------------------------------------- HxROS Details Patient Name: Date of Service: [name], [name] 07/26/2021 2:00 PM Medical Record Number: [id number] Patient Account Number: [id number] Date of Birth/Sex: Treating RN: 08-Apr-1966 (56 y.o. [name] Primary Care Provider: [name] Other Clinician: Referring Provider: Treating Provider/Extender: Darcey Nora in Treatment: 22 Information Obtained  From Patient Hematologic/Lymphatic Medical History: Positive for: Anemia Cardiovascular Medical History: Positive for: Arrhythmia - A-Fib; Congestive Heart Failure; Hypertension Negative for: Angina Endocrine Medical History: Positive for: Type II Diabetes Past Medical History Notes: Grave's disease Time with diabetes: over 10 years Treated with: Diet Blood sugar tested every day: No Immunological Medical History: Positive for: Lupus Erythematosus Immunizations Pneumococcal Vaccine: Received Pneumococcal Vaccination: No Implantable Devices None Family and Social  History Unknown History: Yes; Never smoker; Marital Status - Single; Alcohol Use: Never; Drug Use: No History; Caffeine Use: Rarely; Financial Concerns: No; Food, Clothing or Shelter Needs: No; Support System Lacking: No; Transportation Concerns: No Electronic Signature(s) Signed: 07/26/2021 3:17:38 PM By: Kalman Shan DO Signed: 07/26/2021 3:38:08 PM By: Lorrin Jackson Entered By: Kalman Shan on 07/26/2021 15:13:56 -------------------------------------------------------------------------------- SuperBill Details Patient Name: Date of Service: [name], [name] 07/26/2021 Medical Record Number: [id number] Patient Account Number: [id number] Date of Birth/Sex: Treating RN: 05/20/1966 (56 y.o. [name] Primary Care Provider: [name] Other Clinician: Referring Provider: Treating Provider/Extender: Darcey Nora in Treatment: 22 Diagnosis Coding ICD-10 Codes Code Description [phone] Non-pressure chronic ulcer of other part of right foot with fat layer exposed L97.522 Non-pressure chronic ulcer of other part of left foot with fat layer exposed L93.2 Other local lupus erythematosus B14.78 Chronic diastolic (congestive) heart failure I10 Essential (primary) hypertension E11.621 Type 2 diabetes mellitus with foot ulcer Facility Procedures CPT4 Code:  [id number] Description: [phone] - WOUND CARE VISIT-LEV 5 EST PT Modifier: Quantity: 1 Physician Procedures : CPT4 Code Description Modifier [id number] - WC PHYS LEVEL 3 - EST PT ICD-10 Diagnosis Description L97.512 Non-pressure chronic ulcer of other part of right foot with fat layer exposed L97.522 Non-pressure chronic ulcer of other part of left foot  with fat layer exposed L93.2 Other local lupus erythematosus E11.621 Type 2 diabetes mellitus with foot ulcer Quantity: 1 Electronic Signature(s) Signed: 07/26/2021 4:02:05 PM By: Kalman Shan DO Signed: 07/26/2021 4:39:43 PM By: Baruch Gouty RN, BSN Previous Signature: 07/26/2021 3:17:38 PM Version By: Kalman Shan DO Entered By: Baruch Gouty on 07/26/2021 15:33:12

## 2021-07-26 NOTE — Progress Notes (Signed)
Candace Robinson, Candace Robinson ([id number]) Visit Report for 07/26/2021 Arrival Information Details Patient Name: Date of Service: Candace Robinson, [name] 07/26/2021 2:00 PM Medical Record Number: [id number] Patient Account Number: [id number] Date of Birth/Sex: Treating RN: Jun 17, 1966 (56 y.o. [name], [name] Primary Care Brandom Kerwin: [name] Other Clinician: Referring Vallery Mcdade: Treating Felina Tello/Extender: Darcey Nora in Treatment: 30 Visit Information History Since Last Visit Added or deleted any medications: Yes Patient Arrived: Walker Any new allergies or adverse reactions: No Arrival Time: 14:02 Had a fall or experienced change in No Accompanied By: self activities of daily living that may affect Transfer Assistance: None risk of falls: Patient Identification Verified: Yes Signs or symptoms of abuse/neglect since No Secondary Verification Process Completed: Yes last visito Patient Requires Transmission-Based No Hospitalized since last visit: No Precautions: Implantable device outside of the clinic No Patient Has Alerts: Yes excluding Patient Alerts: R ABI: 1.0 L ABI: 1.1 cellular tissue based products placed in the NO BENZOCAINE center USE LIDOCAINE GEL since last visit: ONLY Has Dressing in Place as Prescribed: Yes Has Footwear/Offloading in Place as Yes Prescribed: Left: Surgical Shoe with Pressure Relief Insole Right: Surgical Shoe with Pressure Relief Insole Pain Present Now: Yes Electronic Signature(s) Signed: 07/26/2021 4:39:43 PM By: Baruch Gouty RN, BSN Entered By: Baruch Gouty on 07/26/2021 14:10:49 -------------------------------------------------------------------------------- Clinic Level of Care Assessment Details Patient Name: Date of Service: Candace Robinson, [name] 07/26/2021 2:00 PM Medical Record Number: [id number] Patient Account Number: [id number] Date of Birth/Sex: Treating RN: 29-Mar-1966 (56 y.o. [name] Primary Care [place]: [name] Other Clinician: Referring Pattie Flaharty: Treating Drucella Karbowski/Extender: Darcey Nora in Treatment: 22 Clinic Level of Care Assessment Items TOOL 4 Quantity Score []  - 0 Use when only an EandM is performed on FOLLOW-UP visit ASSESSMENTS - Nursing Assessment / Reassessment Robinson- 1 10 Reassessment of Co-morbidities (includes updates in patient status) Robinson- 1 5 Reassessment of Adherence to Treatment Plan ASSESSMENTS - Wound and Skin A ssessment / Reassessment []  - 0 Simple Wound Assessment / Reassessment - one wound Robinson- 7 5 Complex Wound Assessment / Reassessment - multiple wounds []  - 0 Dermatologic / Skin Assessment (not related to wound area) ASSESSMENTS - Focused Assessment Robinson- 2 5 Circumferential Edema Measurements - multi extremities []  - 0 Nutritional Assessment / Counseling / Intervention Robinson- 1 5 Lower Extremity Assessment (monofilament, tuning fork, pulses) []  - 0 Peripheral Arterial Disease Assessment (using hand held doppler) ASSESSMENTS - Ostomy and/or Continence Assessment and Care []  - 0 Incontinence Assessment and Management []  - 0 Ostomy Care Assessment and Management (repouching, etc.) PROCESS - Coordination of Care Robinson - Simple Patient / Family Education for ongoing care 1 15 []  - 0 Complex (extensive) Patient / Family Education for ongoing care Robinson- 1 10 Staff obtains Programmer, systems, Records, T Results / Process Orders est []  - 0 Staff telephones HHA, Nursing Homes / Clarify orders / etc []  - 0 Routine Transfer to another Facility (non-emergent condition) []  - 0 Routine Hospital Admission (non-emergent condition) []  - 0 New Admissions / Biomedical engineer / Ordering NPWT Apligraf, etc. , []  - 0 Emergency Hospital Admission (emergent condition) Robinson- 1 10 Simple Discharge Coordination []  - 0 Complex (extensive) Discharge Coordination PROCESS - Special Needs []  - 0 Pediatric / Minor Patient  Management []  - 0 Isolation Patient Management []  - 0 Hearing / Language / Visual special needs []  - 0 Assessment of Community assistance (transportation, D/C planning, etc.) []  -  0 Additional assistance / Altered mentation []  - 0 Support Surface(s) Assessment (bed, cushion, seat, etc.) INTERVENTIONS - Wound Cleansing / Measurement []  - 0 Simple Wound Cleansing - one wound Robinson- 7 5 Complex Wound Cleansing - multiple wounds Robinson- 1 5 Wound Imaging (photographs - any number of wounds) []  - 0 Wound Tracing (instead of photographs) []  - 0 Simple Wound Measurement - one wound Robinson- 7 5 Complex Wound Measurement - multiple wounds INTERVENTIONS - Wound Dressings Robinson - Small Wound Dressing one or multiple wounds 7 10 []  - 0 Medium Wound Dressing one or multiple wounds []  - 0 Large Wound Dressing one or multiple wounds Robinson- 1 5 Application of Medications - topical []  - 0 Application of Medications - injection INTERVENTIONS - Miscellaneous []  - 0 External ear exam []  - 0 Specimen Collection (cultures, biopsies, blood, body fluids, etc.) []  - 0 Specimen(s) / Culture(s) sent or taken to Lab for analysis []  - 0 Patient Transfer (multiple staff / Civil Service fast streamer / Similar devices) []  - 0 Simple Staple / Suture removal (25 or less) []  - 0 Complex Staple / Suture removal (26 or more) []  - 0 Hypo / Hyperglycemic Management (close monitor of Blood Glucose) []  - 0 Ankle / Brachial Index (ABI) - do not check if billed separately Robinson- 1 5 Vital Signs Has the patient been seen at the hospital within the last three years: Yes Total Score: 255 Level Of Care: New/Established - Level 5 Electronic Signature(s) Signed: 07/26/2021 4:39:43 PM By: Baruch Gouty RN, BSN Entered By: Baruch Gouty on 07/26/2021 15:33:02 -------------------------------------------------------------------------------- Encounter Discharge Information Details Patient Name: Date of Service: Candace Robinson, [name] 07/26/2021  2:00 PM Medical Record Number: [id number] Patient Account Number: [id number] Date of Birth/Sex: Treating RN: [birth year] (56 y.o. [name] Primary Care Shailey Butterbaugh: [name] Other Clinician: Referring Aeryn Medici: Treating Khian Remo/Extender: Darcey Nora in Treatment: 22 Encounter Discharge Information Items Discharge Condition: Stable Ambulatory Status: Walker Discharge Destination: Home Transportation: Other Accompanied By: self Schedule Follow-up Appointment: Yes Clinical Summary of Care: Patient Declined Notes transportation service Electronic Signature(s) Signed: 07/26/2021 4:39:43 PM By: Baruch Gouty RN, BSN Entered By: Baruch Gouty on 07/26/2021 15:41:48 -------------------------------------------------------------------------------- Lower Extremity Assessment Details Patient Name: Date of Service: Candace Robinson, [name] 07/26/2021 2:00 PM Medical Record Number: [id number] Patient Account Number: [id number] Date of Birth/Sex: Treating RN: [birth year] (56 y.o. [name] Primary Care Thedford Bunton: [name] Other Clinician: Referring Sirr Kabel: Treating Rahma Meller/Extender: [name] Weeks in Treatment: 22 Edema Assessment Assessed: [Left: No] [Right: No] Edema: [Left: Yes] [Right: Yes] Calf Left: Right: Point of Measurement: 29 cm From Medial Instep 35.8 cm 36 cm Ankle Left: Right: Point of Measurement: 10 cm From Medial Instep 25.3 cm 25.3 cm Vascular Assessment Pulses: Dorsalis Pedis Palpable: [Left:No] [Right:No] Electronic Signature(s) Signed: 07/26/2021 4:39:43 PM By: Baruch Gouty RN, BSN Entered By: Baruch Gouty on 07/26/2021 14:19:13 -------------------------------------------------------------------------------- Multi Wound Chart Details Patient Name: Date of Service: Candace Robinson, [name] 07/26/2021 2:00 PM Medical Record Number: [id number] Patient Account Number:  [id number] Date of Birth/Sex: Treating RN: 1966-04-21 (56 y.o. [name] Primary Care Izzabella Besse: [name] Other Clinician: Referring Srinidhi Landers: Treating Paige Monarrez/Extender: [name] Weeks in Treatment: 22 Vital Signs Height(in): 63 Pulse(bpm): 89 Weight(lbs): 157 Blood Pressure(mmHg): 123/78 Body Mass Index(BMI): 27.8 Temperature(F): 98.4 Respiratory Rate(breaths/min): 18 Photos: Right, Medial Ankle Right, Dorsal Foot Left, Medial Ankle Wound Location: Gradually Appeared  Gradually Appeared Gradually Appeared Wounding Event: Diabetic Wound/Ulcer of the Lower Diabetic Wound/Ulcer of the Lower Diabetic Wound/Ulcer of the Lower Primary Etiology: Extremity Extremity Extremity Anemia, Arrhythmia, Congestive Heart Anemia, Arrhythmia, Congestive Heart Anemia, Arrhythmia, Congestive Heart Comorbid History: Failure, Hypertension, Type II Failure, Hypertension, Type II Failure, Hypertension, Type II Diabetes, Lupus Erythematosus Diabetes, Lupus Erythematosus Diabetes, Lupus Erythematosus 04/03/2020 04/03/2020 02/22/2021 Date Acquired: 22 22 22  Weeks of Treatment: Open Open Open Wound Status: No No No Wound Recurrence: 7.5x6.6x0.3 6.5x6.3x0.2 1.3x2.8x0.1 Measurements [name] [name] D (cm) 38.877 32.162 2.859 A (cm) : rea 11.663 6.432 0.286 Volume (cm) : -135.70% -173.00% -45.60% % Reduction in A rea: -253.50% -446.00% 27.20% % Reduction in Volume: Grade 2 Grade 2 Grade 2 Classification: Medium Medium Medium Exudate A mount: Serosanguineous Serosanguineous Serosanguineous Exudate Type: red, brown red, brown red, brown Exudate Color: Yes Yes No Foul Odor A Cleansing: fter No No N/A Odor A nticipated Due to Product Use: Distinct, outline attached Distinct, outline attached Distinct, outline attached Wound Margin: Small (1-33%) Small (1-33%) Small (1-33%) Granulation Amount: Red, Pink Pink Pink, Pale Granulation Quality: Large  (67-100%) Large (67-100%) Large (67-100%) Necrotic Amount: Eschar, Adherent Slough Eschar, Adherent [employer] Necrotic Tissue: Fat Layer (Subcutaneous Tissue): Yes Fat Layer (Subcutaneous Tissue): Yes Fat Layer (Subcutaneous Tissue): Yes Exposed Structures: Fascia: No Fascia: No Fascia: No Tendon: No Tendon: No Tendon: No Muscle: No Muscle: No Muscle: No Joint: No Joint: No Joint: No Bone: No Bone: No Bone: No None None None Epithelialization: Wound Number: 4 5 6  Photos: Left, Lateral Ankle Left, Dorsal Foot Left, Distal, Medial Foot Wound Location: Gradually Appeared Gradually Appeared Gradually Appeared Wounding Event: Diabetic Wound/Ulcer of the Lower Diabetic Wound/Ulcer of the Lower Diabetic Wound/Ulcer of the Lower Primary Etiology: Extremity Extremity Extremity Anemia, Arrhythmia, Congestive Heart Anemia, Arrhythmia, Congestive Heart Anemia, Arrhythmia, Congestive Heart Comorbid History: Failure, Hypertension, Type II Failure, Hypertension, Type II Failure, Hypertension, Type II Diabetes, Lupus Erythematosus Diabetes, Lupus Erythematosus Diabetes, Lupus Erythematosus 04/03/2020 04/03/2020 04/03/2020 Date Acquired: 22 22 22  Weeks of Treatment: Open Open Open Wound Status: No No No Wound Recurrence: 8.8x6.5x0.4 9.6x9.3x0.4 2.1x1.2x0.1 Measurements [name] [name] D (cm) 44.925 70.12 1.979 A (cm) : rea 17.97 28.048 0.198 Volume (cm) : -19.20% -16.70% -530.30% % Reduction in A rea: -138.30% -366.80% -538.70% % Reduction in Volume: Grade 2 Grade 2 Grade 2 Classification: Medium Medium Small Exudate A mount: Serosanguineous Serosanguineous Serosanguineous Exudate Type: red, brown red, brown red, brown Exudate Color: No Yes No Foul Odor A Cleansing: fter N/A No N/A Odor A nticipated Due to Product Use: Distinct, outline attached Distinct, outline attached Distinct, outline attached Wound Margin: Small (1-33%) None Present (0%) None Present  (0%) Granulation A mount: Red, Pink N/A N/A Granulation Quality: Large (67-100%) Large (67-100%) Large (67-100%) Necrotic A mount: Eschar, Adherent Slough Eschar, Adherent [employer] Necrotic Tissue: Fat Layer (Subcutaneous Tissue): Yes Fat Layer (Subcutaneous Tissue): Yes Fat Layer (Subcutaneous Tissue): Yes Exposed Structures: Fascia: No Fascia: No Fascia: No Tendon: No Tendon: No Tendon: No Muscle: No Muscle: No Muscle: No Joint: No Joint: No Joint: No Bone: No Bone: No Bone: No None None Small (1-33%) Epithelialization: Wound Number: 7 N/A N/A Photos: N/A N/A Right, Lateral Ankle N/A N/A Wound Location: Gradually Appeared N/A N/A Wounding Event: Lupus N/A N/A Primary Etiology: Anemia, Arrhythmia, Congestive Heart N/A N/A Comorbid History: Failure, Hypertension, Type II Diabetes, Lupus Erythematosus 04/08/2021 N/A N/A Date Acquired: 15 N/A N/A Weeks of Treatment: Open N/A N/A Wound  Status: No N/A N/A Wound Recurrence: 1.8x1.8x0.2 N/A N/A Measurements [name] [name] D (cm) 2.545 N/A N/A A (cm) : rea 0.509 N/A N/A Volume (cm) : -8109.70% N/A N/A % Reduction in Area: -16866.70% N/A N/A % Reduction in Volume: Full Thickness Without Exposed N/A N/A Classification: Support Structures Medium N/A N/A Exudate A mount: Serosanguineous N/A N/A Exudate Type: red, brown N/A N/A Exudate Color: No N/A N/A Foul Odor A Cleansing: fter N/A N/A N/A Odor Anticipated Due to Product Use: Distinct, outline attached N/A N/A Wound Margin: Small (1-33%) N/A N/A Granulation A mount: Pink N/A N/A Granulation Quality: Large (67-100%) N/A N/A Necrotic Amount: Adherent Slough N/A N/A Necrotic Tissue: Fat Layer (Subcutaneous Tissue): Yes N/A N/A Exposed Structures: Fascia: No Tendon: No Muscle: No Joint: No Bone: No Small (1-33%) N/A N/A Epithelialization: Treatment Notes Electronic Signature(s) Signed: 07/26/2021 3:17:38 PM By: Kalman Shan  DO Signed: 07/26/2021 3:38:08 PM By: Lorrin Jackson Entered By: Kalman Shan on 07/26/2021 15:12:32 -------------------------------------------------------------------------------- Multi-Disciplinary Care Plan Details Patient Name: Date of Service: Candace Robinson, [name] 07/26/2021 2:00 PM Medical Record Number: [id number] Patient Account Number: [id number] Date of Birth/Sex: Treating RN: [birth year] (56 y.o. [name] Primary Care Lisabeth Mian: [name] Other Clinician: Referring Shaunak Kreis: Treating Boy Delamater/Extender: Darcey Nora in Treatment: [place] reviewed with physician Active Inactive Wound/Skin Impairment Nursing Diagnoses: Impaired tissue integrity Goals: Patient/caregiver will verbalize understanding of skin care regimen Date Initiated: 02/22/2021 Target Resolution Date: 07/30/2021 Goal Status: Active Interventions: Assess patient/caregiver ability to obtain necessary supplies Assess patient/caregiver ability to perform ulcer/skin care regimen upon admission and as needed Assess ulceration(s) every visit Provide education on ulcer and skin care Treatment Activities: Topical wound management initiated : 02/22/2021 Notes: 03/25/21: All wounds not yet at 30%, culture positive. Using gent cream. 05/31/21: Wound regimen continues, patient has not been compliant with all aspects of wound treatment. Electronic Signature(s) Signed: 07/26/2021 4:39:43 PM By: Baruch Gouty RN, BSN Entered By: Baruch Gouty on 07/26/2021 14:31:29 -------------------------------------------------------------------------------- Pain Assessment Details Patient Name: Date of Service: Candace Robinson, [name] 07/26/2021 2:00 PM Medical Record Number: [id number] Patient Account Number: [id number] Date of Birth/Sex: Treating RN: August 14, 1965 (56 y.o. [name] Primary Care Gentry Seeber: [name] Other  Clinician: Referring Bobbye Petti: Treating Jamil Armwood/Extender: Darcey Nora in Treatment: 22 Active Problems Location of Pain Severity and Description of Pain Patient Has Paino Yes Site Locations Pain Location: Pain in Ulcers With Dressing Change: Yes Duration of the Pain. Constant / Intermittento Constant Rate the pain. Current Pain Level: 7 Worst Pain Level: 9 Character of Pain Describe the Pain: Aching Pain Management and Medication Current Pain Management: Medication: Yes Is the Current Pain Management Adequate: Adequate How does your wound impact your activities of daily livingo Sleep: Yes Bathing: No Appetite: No Relationship With Others: No Bladder Continence: No Emotions: Yes Bowel Continence: No Work: No Toileting: No Drive: No Dressing: No Hobbies: No Electronic Signature(s) Signed: 07/26/2021 4:39:43 PM By: Baruch Gouty RN, BSN Entered By: Baruch Gouty on 07/26/2021 14:19:57 -------------------------------------------------------------------------------- Patient/Caregiver Education Details Patient Name: Date of Service: Candace Robinson, [name] 1/23/2023andnbsp2:00 PM Medical Record Number: [id number] Patient Account Number: [id number] Date of Birth/Gender: Treating RN: 1966-04-17 (56 y.o. [name] Primary Care Physician: [name] Other Clinician: Referring Physician: Treating Physician/Extender: Darcey Nora in Treatment: 22 Education Assessment Education Provided To: Patient Education Topics Provided Infection: Methods: Explain/Verbal Responses: Reinforcements  needed, State content correctly Wound/Skin Impairment: Methods: Explain/Verbal Responses: Reinforcements needed, State content correctly Electronic Signature(s) Signed: 07/26/2021 4:39:43 PM By: Baruch Gouty RN, BSN Entered By: Baruch Gouty on 07/26/2021  14:32:35 -------------------------------------------------------------------------------- Wound Assessment Details Patient Name: Date of Service: Candace Robinson, [name] 07/26/2021 2:00 PM Medical Record Number: [id number] Patient Account Number: [id number] Date of Birth/Sex: Treating RN: 1966/01/06 (56 y.o. [name], [name] Primary Care Jaqua Ching: [name] Other Clinician: Referring Nautika Cressey: Treating Sufyan Meidinger/Extender: [name] Weeks in Treatment: 22 Wound Status Wound Number: 1 Primary Diabetic Wound/Ulcer of the Lower Extremity Etiology: Wound Location: Right, Medial Ankle Wound Open Wounding Event: Gradually Appeared Status: Date Acquired: 04/03/2020 Comorbid Anemia, Arrhythmia, Congestive Heart Failure, Hypertension, Weeks Of Treatment: 22 History: Type II Diabetes, Lupus Erythematosus Clustered Wound: No Photos Wound Measurements Length: (cm) 7.5 Width: (cm) 6.6 Depth: (cm) 0.3 Area: (cm) 38.877 Volume: (cm) 11.663 % Reduction in Area: -135.7% % Reduction in Volume: -253.5% Epithelialization: None Tunneling: No Undermining: No Wound Description Classification: Grade 2 Wound Margin: Distinct, outline attached Exudate Amount: Medium Exudate Type: Serosanguineous Exudate Color: red, brown Foul Odor After Cleansing: Yes Due to Product Use: No Slough/Fibrino Yes Wound Bed Granulation Amount: Small (1-33%) Exposed Structure Granulation Quality: Red, Pink Fascia Exposed: No Necrotic Amount: Large (67-100%) Fat Layer (Subcutaneous Tissue) Exposed: Yes Necrotic Quality: Eschar, Adherent Slough Tendon Exposed: No Muscle Exposed: No Joint Exposed: No Bone Exposed: No Electronic Signature(s) Signed: 07/26/2021 3:38:08 PM By: Lorrin Jackson Signed: 07/26/2021 4:39:43 PM By: Baruch Gouty RN, BSN Entered By: Lorrin Jackson on 07/26/2021  14:31:10 -------------------------------------------------------------------------------- Wound Assessment Details Patient Name: Date of Service: Candace Robinson, [name] 07/26/2021 2:00 PM Medical Record Number: [id number] Patient Account Number: [id number] Date of Birth/Sex: Treating RN: 1966-06-26 (56 y.o. [name], [name] Primary Care Aretta Stetzel: [name] Other Clinician: Referring Keylie Beavers: Treating Fabian Coca/Extender: [name] Weeks in Treatment: 22 Wound Status Wound Number: 2 Primary Diabetic Wound/Ulcer of the Lower Extremity Etiology: Wound Location: Right, Dorsal Foot Wound Open Wounding Event: Gradually Appeared Status: Date Acquired: 04/03/2020 Comorbid Anemia, Arrhythmia, Congestive Heart Failure, Hypertension, Weeks Of Treatment: 22 History: Type II Diabetes, Lupus Erythematosus Clustered Wound: No Photos Wound Measurements Length: (cm) 6.5 Width: (cm) 6.3 Depth: (cm) 0.2 Area: (cm) 32.162 Volume: (cm) 6.432 % Reduction in Area: -173% % Reduction in Volume: -446% Epithelialization: None Tunneling: No Undermining: No Wound Description Classification: Grade 2 Wound Margin: Distinct, outline attached Exudate Amount: Medium Exudate Type: Serosanguineous Exudate Color: red, brown Foul Odor After Cleansing: Yes Due to Product Use: No Slough/Fibrino Yes Wound Bed Granulation Amount: Small (1-33%) Exposed Structure Granulation Quality: Pink Fascia Exposed: No Necrotic Amount: Large (67-100%) Fat Layer (Subcutaneous Tissue) Exposed: Yes Necrotic Quality: Eschar, Adherent Slough Tendon Exposed: No Muscle Exposed: No Joint Exposed: No Bone Exposed: No Electronic Signature(s) Signed: 07/26/2021 3:38:08 PM By: Lorrin Jackson Signed: 07/26/2021 4:39:43 PM By: Baruch Gouty RN, BSN Entered By: Lorrin Jackson on 07/26/2021 14:32:17 -------------------------------------------------------------------------------- Wound  Assessment Details Patient Name: Date of Service: Candace Robinson, [name] 07/26/2021 2:00 PM Medical Record Number: [id number] Patient Account Number: [id number] Date of Birth/Sex: Treating RN: 14-Jul-1965 (56 y.o. [name] Primary Care Jolin Benavides: [name] Other Clinician: Referring Shakia Sebastiano: Treating Yassmin Binegar/Extender: [name] Weeks in Treatment: 22 Wound Status Wound Number: 3 Primary Diabetic Wound/Ulcer of the Lower Extremity Etiology: Wound Location: Left, Medial Ankle Wound Open Wounding Event: Gradually Appeared Status: Date Acquired: 02/22/2021 Comorbid Anemia,  Arrhythmia, Congestive Heart Failure, Hypertension, Weeks Of Treatment: 22 History: Type II Diabetes, Lupus Erythematosus Clustered Wound: No Photos Wound Measurements Length: (cm) 1.3 Width: (cm) 2.8 Depth: (cm) 0.1 Area: (cm) 2.859 Volume: (cm) 0.286 % Reduction in Area: -45.6% % Reduction in Volume: 27.2% Epithelialization: None Tunneling: No Undermining: No Wound Description Classification: Grade 2 Wound Margin: Distinct, outline attached Exudate Amount: Medium Exudate Type: Serosanguineous Exudate Color: red, brown Foul Odor After Cleansing: No Slough/Fibrino Yes Wound Bed Granulation Amount: Small (1-33%) Exposed Structure Granulation Quality: Pink, Pale Fascia Exposed: No Necrotic Amount: Large (67-100%) Fat Layer (Subcutaneous Tissue) Exposed: Yes Necrotic Quality: Adherent Slough Tendon Exposed: No Muscle Exposed: No Joint Exposed: No Bone Exposed: No Electronic Signature(s) Signed: 07/26/2021 3:38:08 PM By: Lorrin Jackson Signed: 07/26/2021 4:39:43 PM By: Baruch Gouty RN, BSN Entered By: Lorrin Jackson on 07/26/2021 14:33:44 -------------------------------------------------------------------------------- Wound Assessment Details Patient Name: Date of Service: Candace Robinson, [name] 07/26/2021 2:00 PM Medical Record Number:  [id number] Patient Account Number: [id number] Date of Birth/Sex: Treating RN: 02-01-1966 (56 y.o. [name] Primary Care Jamaris Biernat: [name] Other Clinician: Referring Stryder Poitra: Treating Laurieann Friddle/Extender: [name] Weeks in Treatment: 22 Wound Status Wound Number: 4 Primary Diabetic Wound/Ulcer of the Lower Extremity Etiology: Wound Location: Left, Lateral Ankle Wound Open Wounding Event: Gradually Appeared Status: Date Acquired: 04/03/2020 Comorbid Anemia, Arrhythmia, Congestive Heart Failure, Hypertension, Weeks Of Treatment: 22 History: Type II Diabetes, Lupus Erythematosus Clustered Wound: No Photos Wound Measurements Length: (cm) 8.8 Width: (cm) 6.5 Depth: (cm) 0.4 Area: (cm) 44.925 Volume: (cm) 17.97 % Reduction in Area: -19.2% % Reduction in Volume: -138.3% Epithelialization: None Tunneling: No Undermining: No Wound Description Classification: Grade 2 Wound Margin: Distinct, outline attached Exudate Amount: Medium Exudate Type: Serosanguineous Exudate Color: red, brown Foul Odor After Cleansing: No Slough/Fibrino Yes Wound Bed Granulation Amount: Small (1-33%) Exposed Structure Granulation Quality: Red, Pink Fascia Exposed: No Necrotic Amount: Large (67-100%) Fat Layer (Subcutaneous Tissue) Exposed: Yes Necrotic Quality: Eschar, Adherent Slough Tendon Exposed: No Muscle Exposed: No Joint Exposed: No Bone Exposed: No Electronic Signature(s) Signed: 07/26/2021 3:38:08 PM By: Lorrin Jackson Signed: 07/26/2021 4:39:43 PM By: Baruch Gouty RN, BSN Entered By: Lorrin Jackson on 07/26/2021 14:34:27 -------------------------------------------------------------------------------- Wound Assessment Details Patient Name: Date of Service: Candace Robinson, [name] 07/26/2021 2:00 PM Medical Record Number: [id number] Patient Account Number: [id number] Date of Birth/Sex: Treating RN: 1966-06-15 (56 y.o. [name] Primary Care Tydus Sanmiguel: [name] Other Clinician: Referring Raneen Jaffer: Treating Zakkery Dorian/Extender: [name] Weeks in Treatment: 22 Wound Status Wound Number: 5 Primary Diabetic Wound/Ulcer of the Lower Extremity Etiology: Wound Location: Left, Dorsal Foot Wound Open Wounding Event: Gradually Appeared Status: Date Acquired: 04/03/2020 Comorbid Anemia, Arrhythmia, Congestive Heart Failure, Hypertension, Weeks Of Treatment: 22 History: Type II Diabetes, Lupus Erythematosus Clustered Wound: No Photos Wound Measurements Length: (cm) 9.6 Width: (cm) 9.3 Depth: (cm) 0.4 Area: (cm) 70.12 Volume: (cm) 28.048 % Reduction in Area: -16.7% % Reduction in Volume: -366.8% Epithelialization: None Tunneling: No Undermining: No Wound Description Classification: Grade 2 Wound Margin: Distinct, outline attached Exudate Amount: Medium Exudate Type: Serosanguineous Exudate Color: red, brown Foul Odor After Cleansing: Yes Due to Product Use: No Slough/Fibrino Yes Wound Bed Granulation Amount: None Present (0%) Exposed Structure Necrotic Amount: Large (67-100%) Fascia Exposed: No Necrotic Quality: Eschar, Adherent Slough Fat Layer (Subcutaneous Tissue) Exposed: Yes Tendon Exposed: No Muscle Exposed: No Joint Exposed: No Bone Exposed: No Electronic Signature(s) Signed: 07/26/2021 3:38:08 PM By: Onnie Boer,  Leveda Anna Signed: 07/26/2021 4:39:43 PM By: Baruch Gouty RN, BSN Entered By: Lorrin Jackson on 07/26/2021 14:35:19 -------------------------------------------------------------------------------- Wound Assessment Details Patient Name: Date of Service: Candace Robinson, [name] 07/26/2021 2:00 PM Medical Record Number: [id number] Patient Account Number: [id number] Date of Birth/Sex: Treating RN: [birth year] (56 y.o. [name], [name] Primary Care Simrit Gohlke: [name] Other Clinician: Referring Dmiyah Liscano: Treating Doriana Mazurkiewicz/Extender: [name] Weeks in Treatment: 22 Wound Status Wound Number: 6 Primary Diabetic Wound/Ulcer of the Lower Extremity Etiology: Wound Location: Left, Distal, Medial Foot Wound Open Wounding Event: Gradually Appeared Status: Date Acquired: 04/03/2020 Comorbid Anemia, Arrhythmia, Congestive Heart Failure, Hypertension, Weeks Of Treatment: 22 History: Type II Diabetes, Lupus Erythematosus Clustered Wound: No Photos Wound Measurements Length: (cm) 2.1 Width: (cm) 1.2 Depth: (cm) 0.1 Area: (cm) 1.979 Volume: (cm) 0.198 % Reduction in Area: -530.3% % Reduction in Volume: -538.7% Epithelialization: Small (1-33%) Tunneling: No Undermining: No Wound Description Classification: Grade 2 Wound Margin: Distinct, outline attached Exudate Amount: Small Exudate Type: Serosanguineous Exudate Color: red, brown Foul Odor After Cleansing: No Slough/Fibrino Yes Wound Bed Granulation Amount: None Present (0%) Exposed Structure Necrotic Amount: Large (67-100%) Fascia Exposed: No Necrotic Quality: Adherent Slough Fat Layer (Subcutaneous Tissue) Exposed: Yes Tendon Exposed: No Muscle Exposed: No Joint Exposed: No Bone Exposed: No Electronic Signature(s) Signed: 07/26/2021 3:38:08 PM By: Lorrin Jackson Signed: 07/26/2021 4:39:43 PM By: Baruch Gouty RN, BSN Entered By: Lorrin Jackson on 07/26/2021 14:36:11 -------------------------------------------------------------------------------- Wound Assessment Details Patient Name: Date of Service: Candace Robinson, [name] 07/26/2021 2:00 PM Medical Record Number: [id number] Patient Account Number: [id number] Date of Birth/Sex: Treating RN: 1966/01/13 (56 y.o. [name], [name] Primary Care Candido Flott: [name] Other Clinician: Referring Khairi Garman: Treating Maximilian Tallo/Extender: [name] Weeks in Treatment: 22 Wound Status Wound Number: 7 Primary Lupus Etiology: Wound Location: Right, Lateral  Ankle Wound Open Wounding Event: Gradually Appeared Status: Date Acquired: 04/08/2021 Comorbid Anemia, Arrhythmia, Congestive Heart Failure, Hypertension, Weeks Of Treatment: 15 History: Type II Diabetes, Lupus Erythematosus Clustered Wound: No Photos Wound Measurements Length: (cm) 1.8 Width: (cm) 1.8 Depth: (cm) 0.2 Area: (cm) 2.545 Volume: (cm) 0.509 % Reduction in Area: -8109.7% % Reduction in Volume: -16866.7% Epithelialization: Small (1-33%) Tunneling: No Undermining: No Wound Description Classification: Full Thickness Without Exposed Support Structures Wound Margin: Distinct, outline attached Exudate Amount: Medium Exudate Type: Serosanguineous Exudate Color: red, brown Foul Odor After Cleansing: No Slough/Fibrino Yes Wound Bed Granulation Amount: Small (1-33%) Exposed Structure Granulation Quality: Pink Fascia Exposed: No Necrotic Amount: Large (67-100%) Fat Layer (Subcutaneous Tissue) Exposed: Yes Necrotic Quality: Adherent Slough Tendon Exposed: No Muscle Exposed: No Joint Exposed: No Bone Exposed: No Electronic Signature(s) Signed: 07/26/2021 3:38:08 PM By: Lorrin Jackson Signed: 07/26/2021 4:39:43 PM By: Baruch Gouty RN, BSN Entered By: Lorrin Jackson on 07/26/2021 14:36:54 -------------------------------------------------------------------------------- Vitals Details Patient Name: Date of Service: Candace Robinson, [name] 07/26/2021 2:00 PM Medical Record Number: [id number] Patient Account Number: [id number] Date of Birth/Sex: Treating RN: 1966/02/17 (56 y.o. [name] Primary Care Jaliel Deavers: [name] Other Clinician: Referring Jahira Swiss: Treating Shylee Durrett/Extender: Darcey Nora in Treatment: 22 Vital Signs Time Taken: 14:10 Temperature (F): 98.4 Height (in): 63 Pulse (bpm): 89 Source: Stated Respiratory Rate (breaths/min): 18 Weight (lbs): 157 Blood Pressure (mmHg): 123/78 Source:  Stated Reference Range: 80 - 120 mg / dl Body Mass Index (BMI): 27.8 Electronic Signature(s) Signed: 07/26/2021 4:39:43 PM By: Baruch Gouty RN, BSN Entered By: Baruch Gouty on 07/26/2021  14:11:21 °

## 2021-07-27 ENCOUNTER — Ambulatory Visit: Payer: Medicaid Other | Admitting: Rheumatology

## 2021-07-27 DIAGNOSIS — I4891 Unspecified atrial fibrillation: Secondary | ICD-10-CM

## 2021-07-27 DIAGNOSIS — I700 Atherosclerosis of aorta: Secondary | ICD-10-CM

## 2021-07-27 DIAGNOSIS — D508 Other iron deficiency anemias: Secondary | ICD-10-CM

## 2021-07-27 DIAGNOSIS — E0581 Other thyrotoxicosis with thyrotoxic crisis or storm: Secondary | ICD-10-CM

## 2021-07-27 DIAGNOSIS — D61818 Other pancytopenia: Secondary | ICD-10-CM

## 2021-07-27 DIAGNOSIS — L97519 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: Secondary | ICD-10-CM

## 2021-07-27 DIAGNOSIS — L932 Other local lupus erythematosus: Secondary | ICD-10-CM

## 2021-07-27 DIAGNOSIS — E1159 Type 2 diabetes mellitus with other circulatory complications: Secondary | ICD-10-CM

## 2021-07-27 DIAGNOSIS — E538 Deficiency of other specified B group vitamins: Secondary | ICD-10-CM

## 2021-07-27 DIAGNOSIS — K219 Gastro-esophageal reflux disease without esophagitis: Secondary | ICD-10-CM

## 2021-07-27 DIAGNOSIS — R7989 Other specified abnormal findings of blood chemistry: Secondary | ICD-10-CM

## 2021-07-27 DIAGNOSIS — R760 Raised antibody titer: Secondary | ICD-10-CM

## 2021-07-27 DIAGNOSIS — E559 Vitamin D deficiency, unspecified: Secondary | ICD-10-CM

## 2021-07-27 DIAGNOSIS — I5032 Chronic diastolic (congestive) heart failure: Secondary | ICD-10-CM

## 2021-07-28 ENCOUNTER — Ambulatory Visit: Payer: Medicaid Other | Admitting: Rheumatology

## 2021-07-29 NOTE — Progress Notes (Signed)
Office Visit Note  Patient: Candace Robinson             Date of Birth: 1965-08-21           MRN: [id number]             PCP: [name], MD Referring: [name], MD Visit Date: 07/30/2021 Occupation: @GUAROCC @  Subjective:  Discussed lab results  History of Present Illness: Candace Robinson is a 56 y.o. female with a history of cutaneous lupus and a skin ulcers.  She states she is following up with the vascular surgeon and a plastic surgeon for the skin ulcers.  She has an appointment coming up at [facility] for the cutaneous lupus.  She denies any history of oral ulcers, nasal ulcers, sicca symptoms, raynaud's phenominon, lymphadenopathy or inflammatory arthritis.  Activities of Daily Living:  Patient reports morning stiffness for denies     .   Patient Reports nocturnal pain with wounds  Difficulty dressing/grooming: Denies Difficulty climbing stairs: Reports Difficulty getting out of chair: Reports Difficulty using hands for taps, buttons, cutlery, and/or writing: Reports  Review of Systems  Constitutional:  Positive for fatigue. Negative for night sweats, weight gain and weight loss.  HENT: Negative.  Negative for mouth sores, trouble swallowing, trouble swallowing, mouth dryness and nose dryness.   Eyes: Negative.  Negative for pain, redness, visual disturbance and dryness.  Respiratory: Negative.  Negative for cough, shortness of breath and difficulty breathing.   Cardiovascular: Negative.  Negative for chest pain, palpitations, hypertension, irregular heartbeat and swelling in legs/feet.  Gastrointestinal: Negative.  Negative for blood in stool, constipation and diarrhea.  Endocrine: Positive for cold intolerance. Negative for increased urination.  Genitourinary: Negative.  Negative for vaginal dryness.  Musculoskeletal:  Positive for joint pain, gait problem and joint pain. Negative for joint swelling, myalgias, muscle weakness, morning  stiffness, muscle tenderness and myalgias.  Skin:  Positive for rash, hair loss and sensitivity to sunlight. Negative for color change, skin tightness and ulcers.       Wounds on feet   Allergic/Immunologic: Negative for susceptible to infections.  Neurological:  Positive for numbness. Negative for dizziness, fainting, headaches, memory loss, night sweats and weakness.  Hematological: Negative.  Negative for swollen glands.  Psychiatric/Behavioral:  Positive for depressed mood. Negative for suicidal ideas, self-injury and sleep disturbance. The patient is not nervous/anxious.    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   Iron deficiency anemia due to dietary causes 09/18/2020   Chronic anemia 09/18/2020   GERD without esophagitis 09/18/2020   Controlled type 2 diabetes mellitus with circulatory disorder ([place]) 09/18/2020   Chronic diastolic heart failure ([place]) 09/18/2020   Aortic atherosclerosis ([place]) 09/18/2020   Vitamin D deficiency 09/18/2020   Folate deficiency 09/18/2020   Hypertension associated with type 2 diabetes mellitus ([place]) 09/18/2020   Wound infection    Other pancytopenia ([place])    Thyrotoxicosis 09/01/2020   Atrial fibrillation with RVR ([place])    Ulcers of both lower extremities ([place])    Acute encephalopathy 08/31/2020    Past Medical History:  Diagnosis Date   Anemia    Atrial fibrillation (HCC)    CHF (congestive heart failure) (HCC)    Diabetes mellitus without complication (HCC)    Heart murmur    Hypertension    Lupus ([place])    Thyroid disease    hyperthyroid    Family History  Problem  Relation Age of Onset   Hypertension Mother    Diabetes Mother    COPD Mother    Glaucoma Mother    Cancer - Colon Father    Diabetes Sister    Glaucoma Sister    Uveitis Daughter    Lupus Cousin        systemic   Lupus Cousin        systemic   Lupus Cousin        cutaneous   Past Surgical  History:  Procedure Laterality Date   ABLATION Right    approximately 2015. right lower leg   TONSILLECTOMY     age 65   Social History   Social History Narrative   Not on file   Immunization History  Administered Date(s) Administered   PFIZER(Purple Top)SARS-COV-2 Vaccination 09/12/2019, 06/30/2020     Objective: Vital Signs: BP 134/88    Pulse 68    Ht 5\' 3"  (1.6 m)    Wt 159 lb (72.1 kg)    BMI 28.17 kg/m    Physical Exam Vitals and nursing note reviewed.  Constitutional:      Appearance: She is well-developed.  HENT:     Head: Normocephalic and atraumatic.  Eyes:     Conjunctiva/sclera: Conjunctivae normal.  Cardiovascular:     Rate and Rhythm: Normal rate and regular rhythm.     Heart sounds: Normal heart sounds.  Pulmonary:     Effort: Pulmonary effort is normal.     Breath sounds: Normal breath sounds.  Abdominal:     General: Bowel sounds are normal.     Palpations: Abdomen is soft.  Musculoskeletal:     Cervical back: Normal range of motion.  Lymphadenopathy:     Cervical: No cervical adenopathy.  Skin:    General: Skin is warm and dry.     Capillary Refill: Capillary refill takes less than 2 seconds.  Neurological:     Mental Status: She is alert and oriented to person, place, and time.  Psychiatric:        Behavior: Behavior normal.     Musculoskeletal Exam: C-spine was in good range of motion.  Shoulder joints, elbow joints, wrist joints, MCPs PIPs and DIPs with good range of motion with no synovitis.  Hip joints and knee joints with good range of motion with no tenderness.  Her ankles and feet were bandaged.  CDAI Exam: CDAI Score: -- Patient Global: --; Provider Global: -- Swollen: --; Tender: -- Joint Exam 07/30/2021   No joint exam has been documented for this visit   There is currently no information documented on the homunculus. Go to the Rheumatology activity and complete the homunculus joint exam.  Investigation: No additional  findings.  Imaging: No results found.  Recent Labs: Lab Results  Component Value Date   WBC 8.7 07/06/2021   HGB 7.0 (L) 07/06/2021   PLT 299 07/06/2021   NA 132 (L) 07/06/2021   K 4.0 07/06/2021   CL 97 (L) 07/06/2021   CO2 25 07/06/2021   GLUCOSE 72 07/06/2021   BUN 27 (H) 07/06/2021   CREATININE 1.80 (H) 07/06/2021   BILITOT 0.3 07/06/2021   ALKPHOS 146 (H) 09/10/2020   AST 25 07/06/2021   ALT 9 07/06/2021   PROT 9.8 (H) 07/06/2021   ALBUMIN 2.1 (L) 09/10/2020   CALCIUM 9.4 07/06/2021   July 06, 2021 UA showed 1+ protein, ANA negative, RNP positive (double-stranded DNA, SSA, SSB, Smith, SCL 70 negative), C3-C4 normal, beta-2 negative, anticardiolipin  negative, lupus anticoagulant positive, MPO negative, serine protease 3 negative, cryoglobulins negative, [id number]   Speciality Comments: No specialty comments available.  Procedures:  No procedures performed Allergies: Motrin [ibuprofen]   Assessment / Plan:     Visit Diagnoses: Cutaneous lupus erythematosus - Dx in 2006 by Dr. Memory Argue, bx proven per patient.  Hair loss and scarring was noted.  She has no active lesions today.  Patient states she has recurrent lesions patiently patchy alopecia.  No other clinical features of lupus.  She was referred to Dr. Sharol Roussel  Positive RNP-ANA was negative.  RNP was positive and low titer.  She has no clinical features of mixed connective tissue disease.  I will repeat RNP in 3 months.  Left findings were discussed with the patient at length.  Positive lupus anticoagulant-I will repeat lupus anticoagulant treatments.  The findings were discussed with the patient.  She takes aspirin.  Skin ulcers of both feet (HCC) - deep ulcerations over bilateral ankles and feet.  Evaluated by Dr. Donzetta Matters (vascular surgery).  History of recurrent ulcers for the last 20 years.  She is under care of Dr. Heber Roscoe now.  Elevated serum creatinine-she was referred to nephrology.  Iron deficiency anemia  due to dietary causes-her hemoglobin was low at 7.0.  She was also found to have elevated creatinine.  She was referred to nephrologist.  Other fatigue-she continues to have fatigue.  Hypertension associated with type 2 diabetes mellitus (HCC)-her blood pressure was normal today.  Chronic diastolic heart failure (HCC)  Atrial fibrillation with RVR (HCC)-patient states she had no recurrence of atrial fibrillation after treatment of hyperthyroidism.  Aortic atherosclerosis (HCC)  Controlled type 2 diabetes mellitus with other circulatory complication, without long-term current use of insulin (HCC)  GERD without esophagitis  Other thyrotoxicosis with thyrotoxic crisis or storm  Vitamin D deficiency  Folate deficiency  Orders: Orders Placed This Encounter  Procedures   RNP Antibody   Lupus Anticoagulant Eval w/Reflex   No orders of the defined types were placed in this encounter.    Follow-Up Instructions: Return in about 4 months (around 11/27/2021) for CLE, +LA.   Bo Merino, MD  Note - This record has been created using Editor, commissioning.  Chart creation errors have been sought, but may not always  have been located. Such creation errors do not reflect on  the standard of medical care.

## 2021-07-30 ENCOUNTER — Ambulatory Visit (INDEPENDENT_AMBULATORY_CARE_PROVIDER_SITE_OTHER): Payer: Medicaid Other | Admitting: Rheumatology

## 2021-07-30 ENCOUNTER — Other Ambulatory Visit: Payer: Self-pay

## 2021-07-30 ENCOUNTER — Encounter: Payer: Self-pay | Admitting: Rheumatology

## 2021-07-30 VITALS — BP 134/88 | HR 68 | Ht 63.0 in | Wt 159.0 lb

## 2021-07-30 DIAGNOSIS — L97529 Non-pressure chronic ulcer of other part of left foot with unspecified severity: Secondary | ICD-10-CM

## 2021-07-30 DIAGNOSIS — L932 Other local lupus erythematosus: Secondary | ICD-10-CM

## 2021-07-30 DIAGNOSIS — I152 Hypertension secondary to endocrine disorders: Secondary | ICD-10-CM

## 2021-07-30 DIAGNOSIS — D508 Other iron deficiency anemias: Secondary | ICD-10-CM

## 2021-07-30 DIAGNOSIS — R768 Other specified abnormal immunological findings in serum: Secondary | ICD-10-CM

## 2021-07-30 DIAGNOSIS — R5383 Other fatigue: Secondary | ICD-10-CM

## 2021-07-30 DIAGNOSIS — L97519 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: Secondary | ICD-10-CM | POA: Diagnosis not present

## 2021-07-30 DIAGNOSIS — R760 Raised antibody titer: Secondary | ICD-10-CM | POA: Diagnosis not present

## 2021-07-30 DIAGNOSIS — K219 Gastro-esophageal reflux disease without esophagitis: Secondary | ICD-10-CM

## 2021-07-30 DIAGNOSIS — I700 Atherosclerosis of aorta: Secondary | ICD-10-CM

## 2021-07-30 DIAGNOSIS — I4891 Unspecified atrial fibrillation: Secondary | ICD-10-CM

## 2021-07-30 DIAGNOSIS — E559 Vitamin D deficiency, unspecified: Secondary | ICD-10-CM

## 2021-07-30 DIAGNOSIS — E0581 Other thyrotoxicosis with thyrotoxic crisis or storm: Secondary | ICD-10-CM

## 2021-07-30 DIAGNOSIS — E538 Deficiency of other specified B group vitamins: Secondary | ICD-10-CM

## 2021-07-30 DIAGNOSIS — E1159 Type 2 diabetes mellitus with other circulatory complications: Secondary | ICD-10-CM

## 2021-07-30 DIAGNOSIS — R7989 Other specified abnormal findings of blood chemistry: Secondary | ICD-10-CM

## 2021-07-30 DIAGNOSIS — I5032 Chronic diastolic (congestive) heart failure: Secondary | ICD-10-CM

## 2021-07-30 NOTE — Patient Instructions (Signed)
Return in April for repeat labs

## 2021-08-04 ENCOUNTER — Other Ambulatory Visit: Payer: Self-pay

## 2021-08-04 ENCOUNTER — Other Ambulatory Visit: Payer: Self-pay | Admitting: Nephrology

## 2021-08-04 ENCOUNTER — Ambulatory Visit (INDEPENDENT_AMBULATORY_CARE_PROVIDER_SITE_OTHER): Payer: Medicaid Other | Admitting: Plastic Surgery

## 2021-08-04 ENCOUNTER — Other Ambulatory Visit (HOSPITAL_COMMUNITY): Payer: Self-pay

## 2021-08-04 VITALS — BP 107/70 | HR 85 | Ht 63.0 in | Wt 163.4 lb

## 2021-08-04 DIAGNOSIS — I10 Essential (primary) hypertension: Secondary | ICD-10-CM

## 2021-08-04 DIAGNOSIS — N179 Acute kidney failure, unspecified: Secondary | ICD-10-CM

## 2021-08-04 DIAGNOSIS — S81801A Unspecified open wound, right lower leg, initial encounter: Secondary | ICD-10-CM | POA: Diagnosis not present

## 2021-08-04 DIAGNOSIS — S81802A Unspecified open wound, left lower leg, initial encounter: Secondary | ICD-10-CM

## 2021-08-04 NOTE — Progress Notes (Signed)
Referring Provider [name], MD [address] [place],  [place] [address]   CC:  Chief Complaint  Patient presents with   Advice Only      Candace Robinson is an 56 y.o. female.  HPI: Patient presents with chronic wounds bilateral feet.  She was sent as a referral from the wound center.  She reports that she has had these wounds on and off for the past several years.  She had initially been treated in [place] with what sounds like Unna boots for compression wraps and they ultimately healed a few times but have not healed over the past few years.  She has seen vascular surgery who does not feel that there is anything else to offer her.  She does have lupus but has seen a rheumatologist who did not believe the wounds were autoimmune related according to the patient.  Due to pain clinic debridements have been difficult at the wound center so she is here to consider operating room debridement.  Allergies  Allergen Reactions   Motrin [Ibuprofen] Other (See Comments)    hallucinations    Outpatient Encounter Medications as of 08/04/2021  Medication Sig   acetaminophen (TYLENOL) 325 MG tablet Take 2 tablets (650 mg total) by mouth every 6 (six) hours. (Patient taking differently: Take 500 mg by mouth 2 (two) times daily.)   amLODipine (NORVASC) 10 MG tablet Take 10 mg by mouth daily.   ASPIRIN LOW DOSE 81 MG EC tablet Take 81 mg by mouth daily.   atorvastatin (LIPITOR) 20 MG tablet Take 20 mg by mouth daily.   ferrous sulfate 325 (65 FE) MG tablet Take 1 tablet (325 mg total) by mouth in the morning and at bedtime.   hydrOXYzine (ATARAX/VISTARIL) 25 MG tablet Take 25 mg by mouth every 8 (eight) hours as needed.   levothyroxine (SYNTHROID) 75 MCG tablet Take 75 mcg by mouth every morning.   lisinopril (ZESTRIL) 10 MG tablet Take 10 mg by mouth daily.   Multiple Vitamins-Minerals (SENTRY SENIOR/LUTEIN PO) Take 1 tablet by mouth daily in the afternoon.   NONFORMULARY OR COMPOUNDED  ITEM Compunded med for wound Bassa gel with Gentamycin   omeprazole (PRILOSEC) 20 MG capsule Take 20 mg by mouth 2 (two) times daily.   ondansetron (ZOFRAN) 4 MG tablet Take 4 mg by mouth as needed.   SANTYL ointment Apply 1 application topically daily.   silver sulfADIAZINE (SILVADENE) 1 % cream Apply topically daily.   tacrolimus (PROTOPIC) 0.1 % ointment Apply topically as needed.   traMADol HCl 100 MG TABS Take 1 tablet by mouth as needed.   zinc gluconate 50 MG tablet Take 50 mg by mouth daily.   [DISCONTINUED] HYSEPT 0.25 % SOLN Apply topically 2 (two) times daily. (Patient not taking: Reported on 07/06/2021)   [DISCONTINUED] levothyroxine (SYNTHROID) 50 MCG tablet Take 1 tablet (50 mcg total) by mouth daily. (Patient not taking: Reported on 07/06/2021)   [DISCONTINUED] NON FORMULARY Diet Regular (Patient not taking: Reported on 07/06/2021)   [DISCONTINUED] omeprazole (PRILOSEC) 40 MG capsule Take 1 capsule (40 mg total) by mouth daily. (Patient not taking: Reported on 08/04/2021)   No facility-administered encounter medications on file as of 08/04/2021.     Past Medical History:  Diagnosis Date   Anemia    Atrial fibrillation (HCC)    CHF (congestive heart failure) ([place])    Diabetes mellitus without complication ([place])    Heart murmur    Hypertension    Lupus ([place])  Thyroid disease    hyperthyroid    Past Surgical History:  Procedure Laterality Date   ABLATION Right    approximately 2015. right lower leg   TONSILLECTOMY     age 18    Family History  Problem Relation Age of Onset   Hypertension Mother    Diabetes Mother    COPD Mother    Glaucoma Mother    Cancer - Colon Father    Diabetes Sister    Glaucoma Sister    Uveitis Daughter    Lupus Cousin        systemic   Lupus Cousin        systemic   Lupus Cousin        cutaneous    Social History   Social History Narrative   Not on file     Review of Systems General: Denies fevers, chills, weight loss CV:  Denies chest pain, shortness of breath, palpitations  Physical Exam Vitals with BMI 08/04/2021 07/30/2021 07/06/2021  Height 5\' 3"  5\' 3"  -  Weight 163 lbs 6 oz 159 lbs -  BMI 89.37 34.28 -  Systolic 768 115 726  Diastolic 70 88 81  Pulse 85 68 78    General:  No acute distress,  Alert and oriented, Non-Toxic, Normal speech and affect Exam shows significant ulcerations bilateral ankles and dorsum of feet.  No obvious exposed tendon.  Some areas of granulation tissue mixed with fibrinous exudate.  No signs of infection.  I was unable to palpate a pulse but she does have severe edema bilaterally.  Assessment/Plan Patient presents with chronic ulcers bilateral feet and ankles.  It is possible that these are venous stasis related.  She is anxious to try something as this has been a very debilitating problem for her.  I did explain that the etiology of her problem is a wound healing issue as the wounds have come on spontaneously based on her body's physiology.  I then explained the challenges of trying to correct this with an external debridement or application.  I did conveyed to her that I am uncertain that a debridement would definitively change the course of her healing but is possible that removing any nonviable tissue and application of wound matrix would be of some benefit.  She is fully aware of the risk that include bleeding, infection, damage to surrounding structures and need for additional procedures.  All of her questions were answered and we will move forward with a operating room debridement about a month from now.  This will give her time to see how the new antibiotic topical treatment she has gotten from the wound center will work.  [name] 08/04/2021, 4:00 PM

## 2021-08-05 ENCOUNTER — Inpatient Hospital Stay (HOSPITAL_COMMUNITY): Admission: RE | Admit: 2021-08-05 | Payer: Medicaid Other | Source: Ambulatory Visit

## 2021-08-09 ENCOUNTER — Other Ambulatory Visit: Payer: Self-pay

## 2021-08-09 ENCOUNTER — Encounter (HOSPITAL_BASED_OUTPATIENT_CLINIC_OR_DEPARTMENT_OTHER): Payer: Medicaid Other | Attending: Internal Medicine | Admitting: Internal Medicine

## 2021-08-09 DIAGNOSIS — L97512 Non-pressure chronic ulcer of other part of right foot with fat layer exposed: Secondary | ICD-10-CM | POA: Insufficient documentation

## 2021-08-09 DIAGNOSIS — I5032 Chronic diastolic (congestive) heart failure: Secondary | ICD-10-CM | POA: Insufficient documentation

## 2021-08-09 DIAGNOSIS — L97522 Non-pressure chronic ulcer of other part of left foot with fat layer exposed: Secondary | ICD-10-CM | POA: Insufficient documentation

## 2021-08-09 DIAGNOSIS — L932 Other local lupus erythematosus: Secondary | ICD-10-CM | POA: Insufficient documentation

## 2021-08-09 DIAGNOSIS — I110 Hypertensive heart disease with heart failure: Secondary | ICD-10-CM | POA: Diagnosis not present

## 2021-08-09 DIAGNOSIS — E11621 Type 2 diabetes mellitus with foot ulcer: Secondary | ICD-10-CM | POA: Insufficient documentation

## 2021-08-09 NOTE — Progress Notes (Signed)
JENEFER, WOERNER ([id number]) Visit Report for 08/09/2021 Chief Complaint Document Details Patient Name: Date of Service: [name], [name] 08/09/2021 2:00 PM Medical Record Number: [id number] Patient Account Number: [id number] Date of Birth/Sex: Treating RN: 04-May-1966 (56 y.o. [name] Primary Care Provider: [name] Other Clinician: Referring Provider: Treating Provider/Extender: Darcey Nora in Treatment: 24 Information Obtained from: Patient Chief Complaint Bilateral feet wounds Electronic Signature(s) Signed: 08/09/2021 3:05:09 PM By: Kalman Shan DO Entered By: Kalman Shan on 08/09/2021 14:57:19 -------------------------------------------------------------------------------- Debridement Details Patient Name: Date of Service: [name], [name] 08/09/2021 2:00 PM Medical Record Number: [id number] Patient Account Number: [id number] Date of Birth/Sex: Treating RN: 09/18/1965 (56 y.o. [name] Primary Care Provider: [name] Other Clinician: Referring Provider: Treating Provider/Extender: Darcey Nora in Treatment: 24 Debridement Performed for Assessment: Wound #5 Left,Dorsal Foot Performed By: Physician Kalman Shan, DO Debridement Type: Debridement Severity of Tissue Pre Debridement: Fat layer exposed Level of Consciousness (Pre-procedure): Awake and Alert Pre-procedure Verification/Time Out Yes - 14:40 Taken: Start Time: 14:42 Pain Control: Lidocaine 4% T opical Solution T Area Debrided (L x W): otal 10 (cm) x 9 (cm) = 90 (cm) Tissue and other material debrided: Non-Viable, Slough, Slough Level: Non-Viable Tissue Debridement Description: Selective/Open Wound Instrument: Curette Bleeding: Minimum Hemostasis Achieved: Pressure Procedural Pain: 4 Post Procedural Pain: 3 Response to Treatment: Procedure was tolerated well Level of Consciousness (Post- Awake  and Alert procedure): Post Debridement Measurements of Total Wound Length: (cm) 10 Width: (cm) 9 Depth: (cm) 0.2 Volume: (cm) 14.137 Character of Wound/Ulcer Post Debridement: Requires Further Debridement Severity of Tissue Post Debridement: Fat layer exposed Post Procedure Diagnosis Same as Pre-procedure Electronic Signature(s) Signed: 08/09/2021 3:05:09 PM By: Kalman Shan DO Signed: 08/09/2021 4:37:55 PM By: Baruch Gouty RN, BSN Entered By: Baruch Gouty on 08/09/2021 14:45:04 -------------------------------------------------------------------------------- Debridement Details Patient Name: Date of Service: [name], [name] 08/09/2021 2:00 PM Medical Record Number: [id number] Patient Account Number: [id number] Date of Birth/Sex: Treating RN: 1966/04/27 (56 y.o. [name] Primary Care Provider: [name] Other Clinician: Referring Provider: Treating Provider/Extender: Darcey Nora in Treatment: 24 Debridement Performed for Assessment: Wound #4 Left,Lateral Ankle Performed By: Physician Kalman Shan, DO Debridement Type: Debridement Severity of Tissue Pre Debridement: Fat layer exposed Level of Consciousness (Pre-procedure): Awake and Alert Pre-procedure Verification/Time Out Yes - 14:40 Taken: Start Time: 14:42 Pain Control: Lidocaine 4% T opical Solution T Area Debrided (L x W): otal 8.4 (cm) x 5.8 (cm) = 48.72 (cm) Tissue and other material debrided: Non-Viable, Slough, Slough Level: Non-Viable Tissue Debridement Description: Selective/Open Wound Instrument: Curette Bleeding: Minimum Hemostasis Achieved: Pressure Procedural Pain: 4 Post Procedural Pain: 3 Response to Treatment: Procedure was tolerated well Level of Consciousness (Post- Awake and Alert procedure): Post Debridement Measurements of Total Wound Length: (cm) 8.4 Width: (cm) 5.8 Depth: (cm) 0.3 Volume: (cm) 11.479 Character of  Wound/Ulcer Post Debridement: Requires Further Debridement Severity of Tissue Post Debridement: Fat layer exposed Post Procedure Diagnosis Same as Pre-procedure Electronic Signature(s) Signed: 08/09/2021 3:05:09 PM By: Kalman Shan DO Signed: 08/09/2021 4:37:55 PM By: Baruch Gouty RN, BSN Entered By: Baruch Gouty on 08/09/2021 14:46:22 -------------------------------------------------------------------------------- Debridement Details Patient Name: Date of Service: [name], [name] 08/09/2021 2:00 PM Medical Record Number: [id number] Patient Account Number: [id number] Date of Birth/Sex: Treating RN: [birth year] (56 y.o. [name] Primary Care Provider: [name] Other  Clinician: Referring Provider: Treating Provider/Extender: Darcey Nora in Treatment: 24 Debridement Performed for Assessment: Wound #1 Right,Medial Ankle Performed By: Physician Kalman Shan, DO Debridement Type: Debridement Severity of Tissue Pre Debridement: Fat layer exposed Level of Consciousness (Pre-procedure): Awake and Alert Pre-procedure Verification/Time Out Yes - 14:40 Taken: Start Time: 14:42 Pain Control: Lidocaine 4% T opical Solution T Area Debrided (L x W): otal 7.9 (cm) x 6.4 (cm) = 50.56 (cm) Tissue and other material debrided: Viable, Non-Viable, Slough, Subcutaneous, Slough Level: Skin/Subcutaneous Tissue Debridement Description: Excisional Instrument: Curette Bleeding: Minimum Hemostasis Achieved: Pressure Procedural Pain: 4 Post Procedural Pain: 3 Response to Treatment: Procedure was tolerated well Level of Consciousness (Post- Awake and Alert procedure): Post Debridement Measurements of Total Wound Length: (cm) 7.9 Width: (cm) 6.4 Depth: (cm) 0.2 Volume: (cm) 7.942 Character of Wound/Ulcer Post Debridement: Requires Further Debridement Severity of Tissue Post Debridement: Fat layer exposed Post Procedure Diagnosis Same  as Pre-procedure Electronic Signature(s) Signed: 08/09/2021 3:05:09 PM By: Kalman Shan DO Signed: 08/09/2021 4:37:55 PM By: Baruch Gouty RN, BSN Entered By: Baruch Gouty on 08/09/2021 14:47:22 -------------------------------------------------------------------------------- Debridement Details Patient Name: Date of Service: [name], [name] 08/09/2021 2:00 PM Medical Record Number: [id number] Patient Account Number: [id number] Date of Birth/Sex: Treating RN: 1966-04-03 (56 y.o. [name] Primary Care Provider: [name] Other Clinician: Referring Provider: Treating Provider/Extender: Darcey Nora in Treatment: 24 Debridement Performed for Assessment: Wound #3 Left,Medial Ankle Performed By: Physician Kalman Shan, DO Debridement Type: Debridement Severity of Tissue Pre Debridement: Fat layer exposed Level of Consciousness (Pre-procedure): Awake and Alert Pre-procedure Verification/Time Out Yes - 14:40 Taken: Start Time: 14:42 Pain Control: Lidocaine 4% T opical Solution T Area Debrided (L x W): otal 1.6 (cm) x 1.4 (cm) = 2.24 (cm) Tissue and other material debrided: Non-Viable, Slough, Slough Level: Non-Viable Tissue Debridement Description: Selective/Open Wound Instrument: Curette Bleeding: Minimum Hemostasis Achieved: Pressure Procedural Pain: 4 Post Procedural Pain: 3 Response to Treatment: Procedure was tolerated well Level of Consciousness (Post- Awake and Alert procedure): Post Debridement Measurements of Total Wound Length: (cm) 1.6 Width: (cm) 1.4 Depth: (cm) 0.2 Volume: (cm) 0.352 Character of Wound/Ulcer Post Debridement: Requires Further Debridement Severity of Tissue Post Debridement: Fat layer exposed Post Procedure Diagnosis Same as Pre-procedure Electronic Signature(s) Signed: 08/09/2021 3:05:09 PM By: Kalman Shan DO Signed: 08/09/2021 4:37:55 PM By: Baruch Gouty RN, BSN Entered By:  Baruch Gouty on 08/09/2021 14:48:18 -------------------------------------------------------------------------------- Debridement Details Patient Name: Date of Service: [name], [name] 08/09/2021 2:00 PM Medical Record Number: [id number] Patient Account Number: [id number] Date of Birth/Sex: Treating RN: 01-Jan-1966 (56 y.o. [name], [name] Primary Care Provider: [name] Other Clinician: Referring Provider: Treating Provider/Extender: Darcey Nora in Treatment: 24 Debridement Performed for Assessment: Wound #6 Left,Distal,Medial Foot Performed By: Physician Kalman Shan, DO Debridement Type: Debridement Severity of Tissue Pre Debridement: Fat layer exposed Level of Consciousness (Pre-procedure): Awake and Alert Pre-procedure Verification/Time Out Yes - 14:40 Taken: Start Time: 14:42 Pain Control: Lidocaine 4% T opical Solution T Area Debrided (L x W): otal 1.9 (cm) x 1 (cm) = 1.9 (cm) Tissue and other material debrided: Non-Viable, Slough, Slough Level: Non-Viable Tissue Debridement Description: Selective/Open Wound Instrument: Curette Bleeding: Minimum Hemostasis Achieved: Pressure Procedural Pain: 4 Post Procedural Pain: 3 Response to Treatment: Procedure was tolerated well Level of Consciousness (Post- Awake and Alert procedure): Post Debridement Measurements of Total Wound Length: (cm) 1.9 Width: (cm) 1 Depth: (cm)  0.1 Volume: (cm) 0.149 Character of Wound/Ulcer Post Debridement: Requires Further Debridement Severity of Tissue Post Debridement: Fat layer exposed Post Procedure Diagnosis Same as Pre-procedure Electronic Signature(s) Signed: 08/09/2021 3:05:09 PM By: Kalman Shan DO Signed: 08/09/2021 4:37:55 PM By: Baruch Gouty RN, BSN Entered By: Baruch Gouty on 08/09/2021 14:48:57 -------------------------------------------------------------------------------- Debridement Details Patient Name: Date of  Service: [name], [name] 08/09/2021 2:00 PM Medical Record Number: [id number] Patient Account Number: [id number] Date of Birth/Sex: Treating RN: Sep 22, 1965 (56 y.o. [name], [name] Primary Care Provider: [name] Other Clinician: Referring Provider: Treating Provider/Extender: Darcey Nora in Treatment: 24 Debridement Performed for Assessment: Wound #2 Right,Dorsal Foot Performed By: Physician Kalman Shan, DO Debridement Type: Debridement Severity of Tissue Pre Debridement: Fat layer exposed Level of Consciousness (Pre-procedure): Awake and Alert Pre-procedure Verification/Time Out Yes - 14:40 Taken: Start Time: 14:42 Pain Control: Lidocaine 4% T opical Solution T Area Debrided (L x W): otal 6 (cm) x 5 (cm) = 30 (cm) Tissue and other material debrided: Non-Viable, Slough, Slough Level: Non-Viable Tissue Debridement Description: Selective/Open Wound Instrument: Curette Bleeding: Minimum Hemostasis Achieved: Pressure Procedural Pain: 4 Post Procedural Pain: 3 Response to Treatment: Procedure was tolerated well Level of Consciousness (Post- Awake and Alert procedure): Post Debridement Measurements of Total Wound Length: (cm) 6 Width: (cm) 5 Depth: (cm) 0.2 Volume: (cm) 4.712 Character of Wound/Ulcer Post Debridement: Requires Further Debridement Severity of Tissue Post Debridement: Fat layer exposed Post Procedure Diagnosis Same as Pre-procedure Electronic Signature(s) Signed: 08/09/2021 3:05:09 PM By: Kalman Shan DO Signed: 08/09/2021 4:37:55 PM By: Baruch Gouty RN, BSN Entered By: Baruch Gouty on 08/09/2021 14:49:35 -------------------------------------------------------------------------------- HPI Details Patient Name: Date of Service: [name], [name] 08/09/2021 2:00 PM Medical Record Number: [id number] Patient Account Number: [id number] Date of Birth/Sex: Treating RN: 01/16/1966 (56 y.o. [name] Primary Care Provider: [name] Other Clinician: Referring Provider: Treating Provider/Extender: Darcey Nora in Treatment: 24 History of Present Illness HPI Description: Admission 8/22 Ms. Cherika Jessie is a 56 year old female with a past medical history of diet-controlled type 2 diabetes, hypothyroidism, chronic diastolic heart failure, cutaneous lupus erythematosus and essential hypertension that presents to the clinic for a 55-month history of worsening bilateral feet ulcers. She states that wounds to her feet have been an ongoing issue for several years. She states that with wound care they improve however they tend to wax and wane. 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 She was discharged to a nursing facility and she was provided wound care for her bilateral feet ulcers at that time. She states they used Silvadene and silver alginate. She states that when she was discharged in April from the nursing facility her wounds had improved but were not fully closed. She currently uses Silvadene but has not been using silver alginate for several months due to running out. She reports significant decline in her wound healing over the last 4 months. She reports pain to the wound sites. She currently denies increased warmth erythema or purulent drainage. 8/29; patient presents for 1 week follow-up. She states she had ABIs completed without TBI's. She continues to use silver alginate with dressing changes. She reports continued pain. She denies signs of infection. 9/8; patient presents for 1 week follow-up. She was evaluated by Dr. Donzetta Matters and she reports follow-up with him in 1 month. She has been using Santyl to the wound beds. She reports some improvement in  wound healing. She denies signs of infection. 9/15; patient presents for follow-up. She continues to use Santyl and silver alginate to the wound beds. She  states the alginate is sticking and hard to remove with dressing changes. She currently denies signs of infection. 9/22; patient presents for 1 week follow-up. She is using Santyl and silver alginate to the wound beds. She ran out of [place] and has been using Silvadene. She states that gentamicin ointment is ready at her pharmacy and will be picking this up today. She has no issues or complaints today. She denies signs of infection. 9/30; she is using Santyl and silver alginate. She has substantial wounds on her bilateral feet and bilateral ankles that have been there for a year. She is in a lot of pain. She cannot bear to have these wounds touched let alone a mechanical debridement here. Furthermore the etiology of this is really not totally clear. She had reasonably normal ABIs with triphasic waveforms in August [I did not review this however]. She is completing a course of ciprofloxacin for an area on the left lateral ankle. She has had prior ablations and follows with vascular surgery. Finally she apparently has lupus 10/6; patient has been using Silvadene to the wound beds with dressing changes. She states she is picking up Santyl today. She has no issues or complaints today. She denies signs of infection. 10/17 the patient managed to get Santyl which the she is using to all wound beds. There is a large areas of breakdown here including substantially on the left distal foot, right foot involving the dorsal toes both medial ankles. All of these wounds are covered with some degree of very adherent fibrinous material. The patient did has an appointment with vascular surgery Dr. Donzetta Matters in 2 days time 10/24; patient presents for follow-up. She has been using Santyl and silver alginate to the wound beds. She states that she is going to be set up with rheumatology soon. She denies signs of infection. She overall reports stability in her wound healing. 11/7; patient presents for follow-up. She continues  to use Santyl and silver alginate to the wound beds. She has an appointment set to see rheumatology on 07/06/2021. She reports some improvement in her wound healing. She currently denies signs of infection. 11/14; patient presents for follow-up. She has been using Santyl and silver alginate to the wound beds. She has no issues or complaints today. She denies signs of infection. 11/28; patient presents for follow-up. She has been using silver alginate and Santyl to the wound beds. She did not pick up Dakin's solution. She felt a burning sensation to her feet when it was applied in office. She states she picked up ciprofloxacin 1 week ago. And she is currently taking this. She currently denies signs of infection. 12/12; patient presents for follow-up. States she has been using Silvadene to the wound beds. She has chronic pain. She currently denies signs of infection. 1/9; patient presents for follow-up. She uses Silvadene to the wound beds and changes this up with Dakin's wet-to-dry dressings. She has chronic pain. She states she would like to consider OR debridement at this time. She currently denies systemic signs of infection. 1/23; patient presents for follow-up. She received her Keystone antibiotic and started this yesterday. She has no acute issues. She reports chronic pain to the wound sites. She is scheduled to see Dr. Claudia Desanctis on 2/1 for discussion of OR debridement. 2/6; patient presents for follow-up. She continues to use [facility] antibiotic. She  saw Dr. Claudia Desanctis on 2/1 for discussion of OR debridement. Plan is to do this in 1 month. She currently denies systemic signs of infection. Electronic Signature(s) Signed: 08/09/2021 3:05:09 PM By: Kalman Shan DO Entered By: Kalman Shan on 08/09/2021 14:57:48 -------------------------------------------------------------------------------- Physical Exam Details Patient Name: Date of Service: [name], [name] 08/09/2021 2:00 PM Medical Record  Number: [id number] Patient Account Number: [id number] Date of Birth/Sex: Treating RN: Jan 05, 1966 (56 y.o. [name] Primary Care Provider: [name] Other Clinician: Referring Provider: Treating Provider/Extender: [name] Weeks in Treatment: 24 Constitutional respirations regular, non-labored and within target range for patient.. Cardiovascular 2+ dorsalis pedis/posterior tibialis pulses. Psychiatric pleasant and cooperative. Notes Wounds throughout the feet bilaterally with mostly nonviable tissue and scant granulation tissue present. No obvious signs of surrounding soft tissue infection. Electronic Signature(s) Signed: 08/09/2021 3:05:09 PM By: Kalman Shan DO Entered By: Kalman Shan on 08/09/2021 14:58:18 -------------------------------------------------------------------------------- Physician Orders Details Patient Name: Date of Service: [name], [name] 08/09/2021 2:00 PM Medical Record Number: [id number] Patient Account Number: [id number] Date of Birth/Sex: Treating RN: [birth year] (56 y.o. [name] Primary Care Provider: [name] Other Clinician: Referring Provider: Treating Provider/Extender: Darcey Nora in Treatment: 24 Verbal / Phone Orders: No Diagnosis Coding ICD-10 Coding Code Description [phone] Non-pressure chronic ulcer of other part of right foot with fat layer exposed L97.522 Non-pressure chronic ulcer of other part of left foot with fat layer exposed L93.2 Other local lupus erythematosus C16.60 Chronic diastolic (congestive) heart failure I10 Essential (primary) hypertension E11.621 Type 2 diabetes mellitus with foot ulcer Follow-up Appointments ppointment in 2 weeks. - with Dr. Heber Madras *****EXTRA TIME - 62 MINUTES***** Return A Bathing/ Shower/ Hygiene May shower and wash wound with soap and water. - when dressings changed Edema Control - Lymphedema  / SCD / Other Elevate legs to the level of the heart or above for 30 minutes daily and/or when sitting, a frequency of: - throughout the day Avoid standing for long periods of time. Moisturize legs daily. Additional Orders / Instructions Follow Nutritious Diet - 100-120g of protein daily Wound Treatment Wound #1 - Ankle Wound Laterality: Right, Medial Cleanser: Soap and Water 1 x Per Day/30 Days Discharge Instructions: May shower and wash wound with dial antibacterial soap and water prior to dressing change. Cleanser: Wound Cleanser 1 x Per Day/30 Days Discharge Instructions: Cleanse the wound with wound cleanser prior to applying a clean dressing using gauze sponges, not tissue or cotton balls. Prim Dressing: keystone antibiotic compound 1 x Per Day/30 Days ary Discharge Instructions: thin layer to wound bed Secondary Dressing: ABD Pad, 8x10 (Generic) 1 x Per Day/30 Days Discharge Instructions: Apply over primary dressing as directed. Secondary Dressing: NonWoven Sponge, 4x4 (in/in) (Generic) 1 x Per Day/30 Days Discharge Instructions: Apply over primary dressing as directed Secured With: Kerlix Roll Sterile, 4.5x3.1 (in/yd) 1 x Per Day/30 Days Discharge Instructions: Secure with Kerlix as directed. Secured With: Transpore Surgical Tape, 2x10 (in/yd) 1 x Per Day/30 Days Discharge Instructions: Secure dressing with tape as directed. Wound #2 - Foot Wound Laterality: Dorsal, Right Cleanser: Soap and Water 1 x Per Day/30 Days Discharge Instructions: May shower and wash wound with dial antibacterial soap and water prior to dressing change. Cleanser: Wound Cleanser 1 x Per Day/30 Days Discharge Instructions: Cleanse the wound with wound cleanser prior to applying a clean dressing using gauze sponges, not tissue or cotton balls. Prim Dressing: keystone antibiotic compound  1 x Per Day/30 Days ary Discharge Instructions: thin layer to wound bed Secondary Dressing: ABD Pad, 8x10 (Generic) 1 x  Per Day/30 Days Discharge Instructions: Apply over primary dressing as directed. Secondary Dressing: NonWoven Sponge, 4x4 (in/in) (Generic) 1 x Per Day/30 Days Discharge Instructions: Apply over primary dressing as directed Secured With: Kerlix Roll Sterile, 4.5x3.1 (in/yd) 1 x Per Day/30 Days Discharge Instructions: Secure with Kerlix as directed. Secured With: Transpore Surgical Tape, 2x10 (in/yd) 1 x Per Day/30 Days Discharge Instructions: Secure dressing with tape as directed. Wound #3 - Ankle Wound Laterality: Left, Medial Cleanser: Soap and Water 1 x Per Day/30 Days Discharge Instructions: May shower and wash wound with dial antibacterial soap and water prior to dressing change. Cleanser: Wound Cleanser 1 x Per Day/30 Days Discharge Instructions: Cleanse the wound with wound cleanser prior to applying a clean dressing using gauze sponges, not tissue or cotton balls. Prim Dressing: keystone antibiotic compound 1 x Per Day/30 Days ary Discharge Instructions: thin layer to wound bed Secondary Dressing: ABD Pad, 8x10 (Generic) 1 x Per Day/30 Days Discharge Instructions: Apply over primary dressing as directed. Secondary Dressing: NonWoven Sponge, 4x4 (in/in) (Generic) 1 x Per Day/30 Days Discharge Instructions: Apply over primary dressing as directed Secured With: Kerlix Roll Sterile, 4.5x3.1 (in/yd) 1 x Per Day/30 Days Discharge Instructions: Secure with Kerlix as directed. Secured With: Transpore Surgical Tape, 2x10 (in/yd) 1 x Per Day/30 Days Discharge Instructions: Secure dressing with tape as directed. Wound #4 - Ankle Wound Laterality: Left, Lateral Cleanser: Soap and Water 1 x Per Day/30 Days Discharge Instructions: May shower and wash wound with dial antibacterial soap and water prior to dressing change. Cleanser: Wound Cleanser 1 x Per Day/30 Days Discharge Instructions: Cleanse the wound with wound cleanser prior to applying a clean dressing using gauze sponges, not tissue or  cotton balls. Prim Dressing: keystone antibiotic compound 1 x Per Day/30 Days ary Discharge Instructions: thin layer to wound bed Secondary Dressing: ABD Pad, 8x10 (Generic) 1 x Per Day/30 Days Discharge Instructions: Apply over primary dressing as directed. Secondary Dressing: NonWoven Sponge, 4x4 (in/in) (Generic) 1 x Per Day/30 Days Discharge Instructions: Apply over primary dressing as directed Secured With: Kerlix Roll Sterile, 4.5x3.1 (in/yd) 1 x Per Day/30 Days Discharge Instructions: Secure with Kerlix as directed. Secured With: Transpore Surgical Tape, 2x10 (in/yd) 1 x Per Day/30 Days Discharge Instructions: Secure dressing with tape as directed. Wound #5 - Foot Wound Laterality: Dorsal, Left Cleanser: Soap and Water 1 x Per Day/30 Days Discharge Instructions: May shower and wash wound with dial antibacterial soap and water prior to dressing change. Cleanser: Wound Cleanser 1 x Per Day/30 Days Discharge Instructions: Cleanse the wound with wound cleanser prior to applying a clean dressing using gauze sponges, not tissue or cotton balls. Prim Dressing: keystone antibiotic compound 1 x Per Day/30 Days ary Discharge Instructions: thin layer to wound bed Secondary Dressing: ABD Pad, 8x10 (Generic) 1 x Per Day/30 Days Discharge Instructions: Apply over primary dressing as directed. Secondary Dressing: NonWoven Sponge, 4x4 (in/in) (Generic) 1 x Per Day/30 Days Discharge Instructions: Apply over primary dressing as directed Secured With: Kerlix Roll Sterile, 4.5x3.1 (in/yd) 1 x Per Day/30 Days Discharge Instructions: Secure with Kerlix as directed. Secured With: Transpore Surgical Tape, 2x10 (in/yd) 1 x Per Day/30 Days Discharge Instructions: Secure dressing with tape as directed. Wound #6 - Foot Wound Laterality: Left, Medial, Distal Cleanser: Soap and Water 1 x Per Day/30 Days Discharge Instructions: May shower and wash wound with dial  antibacterial soap and water prior to dressing  change. Cleanser: Wound Cleanser 1 x Per Day/30 Days Discharge Instructions: Cleanse the wound with wound cleanser prior to applying a clean dressing using gauze sponges, not tissue or cotton balls. Prim Dressing: keystone antibiotic compound 1 x Per Day/30 Days ary Discharge Instructions: thin layer to wound bed Secondary Dressing: ABD Pad, 8x10 (Generic) 1 x Per Day/30 Days Discharge Instructions: Apply over primary dressing as directed. Secondary Dressing: NonWoven Sponge, 4x4 (in/in) (Generic) 1 x Per Day/30 Days Discharge Instructions: Apply over primary dressing as directed Secured With: Kerlix Roll Sterile, 4.5x3.1 (in/yd) 1 x Per Day/30 Days Discharge Instructions: Secure with Kerlix as directed. Secured With: Transpore Surgical Tape, 2x10 (in/yd) 1 x Per Day/30 Days Discharge Instructions: Secure dressing with tape as directed. Wound #7 - Ankle Wound Laterality: Right, Lateral Cleanser: Soap and Water 1 x Per Day/30 Days Discharge Instructions: May shower and wash wound with dial antibacterial soap and water prior to dressing change. Cleanser: Wound Cleanser 1 x Per Day/30 Days Discharge Instructions: Cleanse the wound with wound cleanser prior to applying a clean dressing using gauze sponges, not tissue or cotton balls. Prim Dressing: keystone antibiotic compound 1 x Per Day/30 Days ary Discharge Instructions: thin layer to wound bed Secondary Dressing: ABD Pad, 8x10 (Generic) 1 x Per Day/30 Days Discharge Instructions: Apply over primary dressing as directed. Secondary Dressing: NonWoven Sponge, 4x4 (in/in) (Generic) 1 x Per Day/30 Days Discharge Instructions: Apply over primary dressing as directed Secured With: Kerlix Roll Sterile, 4.5x3.1 (in/yd) 1 x Per Day/30 Days Discharge Instructions: Secure with Kerlix as directed. Secured With: Transpore Surgical Tape, 2x10 (in/yd) 1 x Per Day/30 Days Discharge Instructions: Secure dressing with tape as directed. Patient  Medications llergies: Motrin A Notifications Medication Indication Start End prior to debridement 08/09/2021 lidocaine DOSE topical 4 % cream - cream topical Electronic Signature(s) Signed: 08/09/2021 3:05:09 PM By: Kalman Shan DO Entered By: Kalman Shan on 08/09/2021 15:01:18 -------------------------------------------------------------------------------- Problem List Details Patient Name: Date of Service: [name], [name] 08/09/2021 2:00 PM Medical Record Number: [id number] Patient Account Number: [id number] Date of Birth/Sex: Treating RN: [birth year] (56 y.o. [name] Primary Care Provider: [name] Other Clinician: Referring Provider: Treating Provider/Extender: Darcey Nora in Treatment: 24 Active Problems ICD-10 Encounter Code Description Active Date MDM Diagnosis L97.512 Non-pressure chronic ulcer of other part of right foot with fat layer exposed 02/22/2021 No Yes L97.522 Non-pressure chronic ulcer of other part of left foot with fat layer exposed 02/22/2021 No Yes L93.2 Other local lupus erythematosus 02/22/2021 No Yes K27.06 Chronic diastolic (congestive) heart failure 02/22/2021 No Yes I10 Essential (primary) hypertension 02/22/2021 No Yes E11.621 Type 2 diabetes mellitus with foot ulcer 02/22/2021 No Yes Inactive Problems Resolved Problems Electronic Signature(s) Signed: 08/09/2021 3:05:09 PM By: Kalman Shan DO Entered By: Kalman Shan on 08/09/2021 14:56:55 -------------------------------------------------------------------------------- Progress Note Details Patient Name: Date of Service: [name], [name] 08/09/2021 2:00 PM Medical Record Number: [id number] Patient Account Number: [id number] Date of Birth/Sex: Treating RN: 04/27/1966 (56 y.o. [name] Primary Care Provider: [name] Other Clinician: Referring Provider: Treating Provider/Extender: Darcey Nora in Treatment: 24 Subjective Chief Complaint Information obtained from Patient Bilateral feet wounds History of Present Illness (HPI) Admission 8/22 Ms. Dacoda Spallone is a 56 year old female with a past medical history of diet-controlled type 2 diabetes, hypothyroidism, chronic diastolic heart failure, cutaneous lupus erythematosus and essential hypertension that presents  to the clinic for a 59-month history of worsening bilateral feet ulcers. She states that wounds to her feet have been an ongoing issue for several years. She states that with wound care they improve however they tend to wax and wane. 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 She was discharged to a nursing facility and she was provided wound care for her bilateral feet ulcers at that time. She states they used Silvadene and silver alginate. She states that when she was discharged in April from the nursing facility her wounds had improved but were not fully closed. She currently uses Silvadene but has not been using silver alginate for several months due to running out. She reports significant decline in her wound healing over the last 4 months. She reports pain to the wound sites. She currently denies increased warmth erythema or purulent drainage. 8/29; patient presents for 1 week follow-up. She states she had ABIs completed without TBI's. She continues to use silver alginate with dressing changes. She reports continued pain. She denies signs of infection. 9/8; patient presents for 1 week follow-up. She was evaluated by Dr. Donzetta Matters and she reports follow-up with him in 1 month. She has been using Santyl to the wound beds. She reports some improvement in wound healing. She denies signs of infection. 9/15; patient presents for follow-up. She continues to use Santyl and silver alginate to the wound beds. She states the alginate is sticking and hard to remove with dressing changes. She  currently denies signs of infection. 9/22; patient presents for 1 week follow-up. She is using Santyl and silver alginate to the wound beds. She ran out of [place] and has been using Silvadene. She states that gentamicin ointment is ready at her pharmacy and will be picking this up today. She has no issues or complaints today. She denies signs of infection. 9/30; she is using Santyl and silver alginate. She has substantial wounds on her bilateral feet and bilateral ankles that have been there for a year. She is in a lot of pain. She cannot bear to have these wounds touched let alone a mechanical debridement here. Furthermore the etiology of this is really not totally clear. She had reasonably normal ABIs with triphasic waveforms in August [I did not review this however]. She is completing a course of ciprofloxacin for an area on the left lateral ankle. She has had prior ablations and follows with vascular surgery. Finally she apparently has lupus 10/6; patient has been using Silvadene to the wound beds with dressing changes. She states she is picking up Santyl today. She has no issues or complaints today. She denies signs of infection. 10/17 the patient managed to get Santyl which the she is using to all wound beds. There is a large areas of breakdown here including substantially on the left distal foot, right foot involving the dorsal toes both medial ankles. All of these wounds are covered with some degree of very adherent fibrinous material. The patient did has an appointment with vascular surgery Dr. Donzetta Matters in 2 days time 10/24; patient presents for follow-up. She has been using Santyl and silver alginate to the wound beds. She states that she is going to be set up with rheumatology soon. She denies signs of infection. She overall reports stability in her wound healing. 11/7; patient presents for follow-up. She continues to use Santyl and silver alginate to the wound beds. She has an appointment set  to see rheumatology on 07/06/2021. She reports some  improvement in her wound healing. She currently denies signs of infection. 11/14; patient presents for follow-up. She has been using Santyl and silver alginate to the wound beds. She has no issues or complaints today. She denies signs of infection. 11/28; patient presents for follow-up. She has been using silver alginate and Santyl to the wound beds. She did not pick up Dakin's solution. She felt a burning sensation to her feet when it was applied in office. She states she picked up ciprofloxacin 1 week ago. And she is currently taking this. She currently denies signs of infection. 12/12; patient presents for follow-up. States she has been using Silvadene to the wound beds. She has chronic pain. She currently denies signs of infection. 1/9; patient presents for follow-up. She uses Silvadene to the wound beds and changes this up with Dakin's wet-to-dry dressings. She has chronic pain. She states she would like to consider OR debridement at this time. She currently denies systemic signs of infection. 1/23; patient presents for follow-up. She received her Keystone antibiotic and started this yesterday. She has no acute issues. She reports chronic pain to the wound sites. She is scheduled to see Dr. Claudia Desanctis on 2/1 for discussion of OR debridement. 2/6; patient presents for follow-up. She continues to use [facility] antibiotic. She saw Dr. Claudia Desanctis on 2/1 for discussion of OR debridement. Plan is to do this in 1 month. She currently denies systemic signs of infection. Patient History Information obtained from Patient. Family History Unknown History. Social History Never smoker, Marital Status - Single, Alcohol Use - Never, Drug Use - No History, Caffeine Use - Rarely. Medical History Hematologic/Lymphatic Patient has history of Anemia Cardiovascular Patient has history of Arrhythmia - A-Fib, Congestive Heart Failure, Hypertension Denies history of  Angina Endocrine Patient has history of Type II Diabetes Immunological Patient has history of Lupus Erythematosus Medical A Surgical History Notes nd Endocrine Grave's disease Objective Constitutional respirations regular, non-labored and within target range for patient.. Vitals Time Taken: 2:04 PM, Height: 63 in, Weight: 157 lbs, BMI: 27.8, Temperature: 97.7 F, Pulse: 80 bpm, Respiratory Rate: 18 breaths/min, Blood Pressure: 103/67 mmHg. Cardiovascular 2+ dorsalis pedis/posterior tibialis pulses. Psychiatric pleasant and cooperative. General Notes: Wounds throughout the feet bilaterally with mostly nonviable tissue and scant granulation tissue present. No obvious signs of surrounding soft tissue infection. Integumentary (Hair, Skin) Wound #1 status is Open. Original cause of wound was Gradually Appeared. The date acquired was: 04/03/2020. The wound has been in treatment 24 weeks. The wound is located on the Right,Medial Ankle. The wound measures 7.9cm length x 6.4cm width x 0.2cm depth; 39.71cm^2 area and 7.942cm^3 volume. There is Fat Layer (Subcutaneous Tissue) exposed. There is no tunneling or undermining noted. There is a medium amount of serosanguineous drainage noted. Foul odor after cleansing was noted. The wound margin is distinct with the outline attached to the wound base. There is small (1-33%) red, pink granulation within the wound bed. There is a large (67-100%) amount of necrotic tissue within the wound bed including Adherent Slough. Wound #2 status is Open. Original cause of wound was Gradually Appeared. The date acquired was: 04/03/2020. The wound has been in treatment 24 weeks. The wound is located on the Right,Dorsal Foot. The wound measures 6cm length x 5cm width x 0.2cm depth; 23.562cm^2 area and 4.712cm^3 volume. There is Fat Layer (Subcutaneous Tissue) exposed. There is no tunneling or undermining noted. There is a medium amount of serosanguineous drainage noted.  The wound margin is distinct with the outline attached  to the wound base. There is small (1-33%) pink granulation within the wound bed. There is a large (67-100%) amount of necrotic tissue within the wound bed including Eschar and Adherent Slough. Wound #3 status is Open. Original cause of wound was Gradually Appeared. The date acquired was: 02/22/2021. The wound has been in treatment 24 weeks. The wound is located on the Left,Medial Ankle. The wound measures 1.6cm length x 1.4cm width x 0.2cm depth; 1.759cm^2 area and 0.352cm^3 volume. There is Fat Layer (Subcutaneous Tissue) exposed. There is no tunneling or undermining noted. There is a medium amount of serosanguineous drainage noted. The wound margin is distinct with the outline attached to the wound base. There is small (1-33%) pink granulation within the wound bed. There is a large (67-100%) amount of necrotic tissue within the wound bed including Adherent Slough. Wound #4 status is Open. Original cause of wound was Gradually Appeared. The date acquired was: 04/03/2020. The wound has been in treatment 24 weeks. The wound is located on the Left,Lateral Ankle. The wound measures 8.4cm length x 5.8cm width x 0.3cm depth; 38.265cm^2 area and 11.479cm^3 volume. There is Fat Layer (Subcutaneous Tissue) exposed. There is no tunneling or undermining noted. There is a medium amount of serosanguineous drainage noted. The wound margin is distinct with the outline attached to the wound base. There is medium (34-66%) red, pink granulation within the wound bed. There is a medium (34-66%) amount of necrotic tissue within the wound bed including Adherent Slough. Wound #5 status is Open. Original cause of wound was Gradually Appeared. The date acquired was: 04/03/2020. The wound has been in treatment 24 weeks. The wound is located on the Left,Dorsal Foot. The wound measures 10cm length x 9cm width x 0.2cm depth; 70.686cm^2 area and 14.137cm^3 volume. There is Fat  Layer (Subcutaneous Tissue) exposed. There is no tunneling or undermining noted. There is a medium amount of serosanguineous drainage noted. The wound margin is distinct with the outline attached to the wound base. There is small (1-33%) granulation within the wound bed. There is a large (67-100%) amount of necrotic tissue within the wound bed including Adherent Slough. Wound #6 status is Open. Original cause of wound was Gradually Appeared. The date acquired was: 04/03/2020. The wound has been in treatment 24 weeks. The wound is located on the Left,Distal,Medial Foot. The wound measures 1.9cm length x 1cm width x 0.1cm depth; 1.492cm^2 area and 0.149cm^3 volume. There is Fat Layer (Subcutaneous Tissue) exposed. There is no tunneling or undermining noted. There is a small amount of serosanguineous drainage noted. The wound margin is distinct with the outline attached to the wound base. There is small (1-33%) granulation within the wound bed. There is a large (67-100%) amount of necrotic tissue within the wound bed including Adherent Slough. Wound #7 status is Open. Original cause of wound was Gradually Appeared. The date acquired was: 04/08/2021. The wound has been in treatment 17 weeks. The wound is located on the Right,Lateral Ankle. The wound measures 1.8cm length x 2cm width x 0.2cm depth; 2.827cm^2 area and 0.565cm^3 volume. There is Fat Layer (Subcutaneous Tissue) exposed. There is a medium amount of serosanguineous drainage noted. The wound margin is distinct with the outline attached to the wound base. There is small (1-33%) pink granulation within the wound bed. There is a large (67-100%) amount of necrotic tissue within the wound bed including Adherent Slough. Assessment Active Problems ICD-10 Non-pressure chronic ulcer of other part of right foot with fat layer exposed Non-pressure chronic ulcer  of other part of left foot with fat layer exposed Other local lupus erythematosus Chronic  diastolic (congestive) heart failure Essential (primary) hypertension Type 2 diabetes mellitus with foot ulcer Patient has done well with [facility] antibiotic. She was able to tolerate some debridement today. No signs of surrounding infection. I recommended continuing Keystone antibiotic. Plan is for OR debridement in 1 month. I will see her back in 2 weeks. Procedures Wound #1 Pre-procedure diagnosis of Wound #1 is a Diabetic Wound/Ulcer of the Lower Extremity located on the Right,Medial Ankle .Severity of Tissue Pre Debridement is: Fat layer exposed. There was a Excisional Skin/Subcutaneous Tissue Debridement with a total area of 50.56 sq cm performed by Kalman Shan, DO. With the following instrument(s): Curette to remove Viable and Non-Viable tissue/material. Material removed includes Subcutaneous Tissue and Slough and after achieving pain control using Lidocaine 4% Topical Solution. No specimens were taken. A time out was conducted at 14:40, prior to the start of the procedure. A Minimum amount of bleeding was controlled with Pressure. The procedure was tolerated well with a pain level of 4 throughout and a pain level of 3 following the procedure. Post Debridement Measurements: 7.9cm length x 6.4cm width x 0.2cm depth; 7.942cm^3 volume. Character of Wound/Ulcer Post Debridement requires further debridement. Severity of Tissue Post Debridement is: Fat layer exposed. Post procedure Diagnosis Wound #1: Same as Pre-Procedure Wound #2 Pre-procedure diagnosis of Wound #2 is a Diabetic Wound/Ulcer of the Lower Extremity located on the Right,Dorsal Foot .Severity of Tissue Pre Debridement is: Fat layer exposed. There was a Selective/Open Wound Non-Viable Tissue Debridement with a total area of 30 sq cm performed by Kalman Shan, DO. With the following instrument(s): Curette to remove Non-Viable tissue/material. Material removed includes Slough after achieving pain control using Lidocaine 4% T  opical Solution. No specimens were taken. A time out was conducted at 14:40, prior to the start of the procedure. A Minimum amount of bleeding was controlled with Pressure. The procedure was tolerated well with a pain level of 4 throughout and a pain level of 3 following the procedure. Post Debridement Measurements: 6cm length x 5cm width x 0.2cm depth; 4.712cm^3 volume. Character of Wound/Ulcer Post Debridement requires further debridement. Severity of Tissue Post Debridement is: Fat layer exposed. Post procedure Diagnosis Wound #2: Same as Pre-Procedure Wound #3 Pre-procedure diagnosis of Wound #3 is a Diabetic Wound/Ulcer of the Lower Extremity located on the Left,Medial Ankle .Severity of Tissue Pre Debridement is: Fat layer exposed. There was a Selective/Open Wound Non-Viable Tissue Debridement with a total area of 2.24 sq cm performed by Kalman Shan, DO. With the following instrument(s): Curette to remove Non-Viable tissue/material. Material removed includes Slough after achieving pain control using Lidocaine 4% T opical Solution. No specimens were taken. A time out was conducted at 14:40, prior to the start of the procedure. A Minimum amount of bleeding was controlled with Pressure. The procedure was tolerated well with a pain level of 4 throughout and a pain level of 3 following the procedure. Post Debridement Measurements: 1.6cm length x 1.4cm width x 0.2cm depth; 0.352cm^3 volume. Character of Wound/Ulcer Post Debridement requires further debridement. Severity of Tissue Post Debridement is: Fat layer exposed. Post procedure Diagnosis Wound #3: Same as Pre-Procedure Wound #4 Pre-procedure diagnosis of Wound #4 is a Diabetic Wound/Ulcer of the Lower Extremity located on the Left,Lateral Ankle .Severity of Tissue Pre Debridement is: Fat layer exposed. There was a Selective/Open Wound Non-Viable Tissue Debridement with a total area of 48.72 sq cm performed  by Kalman Shan, DO. With  the following instrument(s): Curette to remove Non-Viable tissue/material. Material removed includes Slough after achieving pain control using Lidocaine 4% T opical Solution. No specimens were taken. A time out was conducted at 14:40, prior to the start of the procedure. A Minimum amount of bleeding was controlled with Pressure. The procedure was tolerated well with a pain level of 4 throughout and a pain level of 3 following the procedure. Post Debridement Measurements: 8.4cm length x 5.8cm width x 0.3cm depth; 11.479cm^3 volume. Character of Wound/Ulcer Post Debridement requires further debridement. Severity of Tissue Post Debridement is: Fat layer exposed. Post procedure Diagnosis Wound #4: Same as Pre-Procedure Wound #5 Pre-procedure diagnosis of Wound #5 is a Diabetic Wound/Ulcer of the Lower Extremity located on the Left,Dorsal Foot .Severity of Tissue Pre Debridement is: Fat layer exposed. There was a Selective/Open Wound Non-Viable Tissue Debridement with a total area of 90 sq cm performed by Kalman Shan, DO. With the following instrument(s): Curette to remove Non-Viable tissue/material. Material removed includes Slough after achieving pain control using Lidocaine 4% T opical Solution. No specimens were taken. A time out was conducted at 14:40, prior to the start of the procedure. A Minimum amount of bleeding was controlled with Pressure. The procedure was tolerated well with a pain level of 4 throughout and a pain level of 3 following the procedure. Post Debridement Measurements: 10cm length x 9cm width x 0.2cm depth; 14.137cm^3 volume. Character of Wound/Ulcer Post Debridement requires further debridement. Severity of Tissue Post Debridement is: Fat layer exposed. Post procedure Diagnosis Wound #5: Same as Pre-Procedure Wound #6 Pre-procedure diagnosis of Wound #6 is a Diabetic Wound/Ulcer of the Lower Extremity located on the Left,Distal,Medial Foot .Severity of Tissue  Pre Debridement is: Fat layer exposed. There was a Selective/Open Wound Non-Viable Tissue Debridement with a total area of 1.9 sq cm performed by Kalman Shan, DO. With the following instrument(s): Curette to remove Non-Viable tissue/material. Material removed includes Slough after achieving pain control using Lidocaine 4% T opical Solution. No specimens were taken. A time out was conducted at 14:40, prior to the start of the procedure. A Minimum amount of bleeding was controlled with Pressure. The procedure was tolerated well with a pain level of 4 throughout and a pain level of 3 following the procedure. Post Debridement Measurements: 1.9cm length x 1cm width x 0.1cm depth; 0.149cm^3 volume. Character of Wound/Ulcer Post Debridement requires further debridement. Severity of Tissue Post Debridement is: Fat layer exposed. Post procedure Diagnosis Wound #6: Same as Pre-Procedure Plan Follow-up Appointments: Return Appointment in 2 weeks. - with Dr. Heber [place] *****EXTRA TIME - 48 MINUTES***** Bathing/ Shower/ Hygiene: May shower and wash wound with soap and water. - when dressings changed Edema Control - Lymphedema / SCD / Other: Elevate legs to the level of the heart or above for 30 minutes daily and/or when sitting, a frequency of: - throughout the day Avoid standing for long periods of time. Moisturize legs daily. Additional Orders / Instructions: Follow Nutritious Diet - 100-120g of protein daily The following medication(s) was prescribed: lidocaine topical 4 % cream cream topical for prior to debridement was prescribed at facility WOUND #1: - Ankle Wound Laterality: Right, Medial Cleanser: Soap and Water 1 x Per Day/30 Days Discharge Instructions: May shower and wash wound with dial antibacterial soap and water prior to dressing change. Cleanser: Wound Cleanser 1 x Per Day/30 Days Discharge Instructions: Cleanse the wound with wound cleanser prior to applying a clean dressing using gauze  sponges,  not tissue or cotton balls. Prim Dressing: keystone antibiotic compound 1 x Per Day/30 Days ary Discharge Instructions: thin layer to wound bed Secondary Dressing: ABD Pad, 8x10 (Generic) 1 x Per Day/30 Days Discharge Instructions: Apply over primary dressing as directed. Secondary Dressing: NonWoven Sponge, 4x4 (in/in) (Generic) 1 x Per Day/30 Days Discharge Instructions: Apply over primary dressing as directed Secured With: Kerlix Roll Sterile, 4.5x3.1 (in/yd) 1 x Per Day/30 Days Discharge Instructions: Secure with Kerlix as directed. Secured With: Transpore Surgical T ape, 2x10 (in/yd) 1 x Per Day/30 Days Discharge Instructions: Secure dressing with tape as directed. WOUND #2: - Foot Wound Laterality: Dorsal, Right Cleanser: Soap and Water 1 x Per Day/30 Days Discharge Instructions: May shower and wash wound with dial antibacterial soap and water prior to dressing change. Cleanser: Wound Cleanser 1 x Per Day/30 Days Discharge Instructions: Cleanse the wound with wound cleanser prior to applying a clean dressing using gauze sponges, not tissue or cotton balls. Prim Dressing: keystone antibiotic compound 1 x Per Day/30 Days ary Discharge Instructions: thin layer to wound bed Secondary Dressing: ABD Pad, 8x10 (Generic) 1 x Per Day/30 Days Discharge Instructions: Apply over primary dressing as directed. Secondary Dressing: NonWoven Sponge, 4x4 (in/in) (Generic) 1 x Per Day/30 Days Discharge Instructions: Apply over primary dressing as directed Secured With: Kerlix Roll Sterile, 4.5x3.1 (in/yd) 1 x Per Day/30 Days Discharge Instructions: Secure with Kerlix as directed. Secured With: Transpore Surgical T ape, 2x10 (in/yd) 1 x Per Day/30 Days Discharge Instructions: Secure dressing with tape as directed. WOUND #3: - Ankle Wound Laterality: Left, Medial Cleanser: Soap and Water 1 x Per Day/30 Days Discharge Instructions: May shower and wash wound with dial antibacterial soap and  water prior to dressing change. Cleanser: Wound Cleanser 1 x Per Day/30 Days Discharge Instructions: Cleanse the wound with wound cleanser prior to applying a clean dressing using gauze sponges, not tissue or cotton balls. Prim Dressing: keystone antibiotic compound 1 x Per Day/30 Days ary Discharge Instructions: thin layer to wound bed Secondary Dressing: ABD Pad, 8x10 (Generic) 1 x Per Day/30 Days Discharge Instructions: Apply over primary dressing as directed. Secondary Dressing: NonWoven Sponge, 4x4 (in/in) (Generic) 1 x Per Day/30 Days Discharge Instructions: Apply over primary dressing as directed Secured With: Kerlix Roll Sterile, 4.5x3.1 (in/yd) 1 x Per Day/30 Days Discharge Instructions: Secure with Kerlix as directed. Secured With: Transpore Surgical T ape, 2x10 (in/yd) 1 x Per Day/30 Days Discharge Instructions: Secure dressing with tape as directed. WOUND #4: - Ankle Wound Laterality: Left, Lateral Cleanser: Soap and Water 1 x Per Day/30 Days Discharge Instructions: May shower and wash wound with dial antibacterial soap and water prior to dressing change. Cleanser: Wound Cleanser 1 x Per Day/30 Days Discharge Instructions: Cleanse the wound with wound cleanser prior to applying a clean dressing using gauze sponges, not tissue or cotton balls. Prim Dressing: keystone antibiotic compound 1 x Per Day/30 Days ary Discharge Instructions: thin layer to wound bed Secondary Dressing: ABD Pad, 8x10 (Generic) 1 x Per Day/30 Days Discharge Instructions: Apply over primary dressing as directed. Secondary Dressing: NonWoven Sponge, 4x4 (in/in) (Generic) 1 x Per Day/30 Days Discharge Instructions: Apply over primary dressing as directed Secured With: Kerlix Roll Sterile, 4.5x3.1 (in/yd) 1 x Per Day/30 Days Discharge Instructions: Secure with Kerlix as directed. Secured With: Transpore Surgical T ape, 2x10 (in/yd) 1 x Per Day/30 Days Discharge Instructions: Secure dressing with tape as  directed. WOUND #5: - Foot Wound Laterality: Dorsal, Left Cleanser: Soap and Water 1  x Per Day/30 Days Discharge Instructions: May shower and wash wound with dial antibacterial soap and water prior to dressing change. Cleanser: Wound Cleanser 1 x Per Day/30 Days Discharge Instructions: Cleanse the wound with wound cleanser prior to applying a clean dressing using gauze sponges, not tissue or cotton balls. Prim Dressing: keystone antibiotic compound 1 x Per Day/30 Days ary Discharge Instructions: thin layer to wound bed Secondary Dressing: ABD Pad, 8x10 (Generic) 1 x Per Day/30 Days Discharge Instructions: Apply over primary dressing as directed. Secondary Dressing: NonWoven Sponge, 4x4 (in/in) (Generic) 1 x Per Day/30 Days Discharge Instructions: Apply over primary dressing as directed Secured With: Kerlix Roll Sterile, 4.5x3.1 (in/yd) 1 x Per Day/30 Days Discharge Instructions: Secure with Kerlix as directed. Secured With: Transpore Surgical T ape, 2x10 (in/yd) 1 x Per Day/30 Days Discharge Instructions: Secure dressing with tape as directed. WOUND #6: - Foot Wound Laterality: Left, Medial, Distal Cleanser: Soap and Water 1 x Per Day/30 Days Discharge Instructions: May shower and wash wound with dial antibacterial soap and water prior to dressing change. Cleanser: Wound Cleanser 1 x Per Day/30 Days Discharge Instructions: Cleanse the wound with wound cleanser prior to applying a clean dressing using gauze sponges, not tissue or cotton balls. Prim Dressing: keystone antibiotic compound 1 x Per Day/30 Days ary Discharge Instructions: thin layer to wound bed Secondary Dressing: ABD Pad, 8x10 (Generic) 1 x Per Day/30 Days Discharge Instructions: Apply over primary dressing as directed. Secondary Dressing: NonWoven Sponge, 4x4 (in/in) (Generic) 1 x Per Day/30 Days Discharge Instructions: Apply over primary dressing as directed Secured With: Kerlix Roll Sterile, 4.5x3.1 (in/yd) 1 x Per Day/30  Days Discharge Instructions: Secure with Kerlix as directed. Secured With: Transpore Surgical T ape, 2x10 (in/yd) 1 x Per Day/30 Days Discharge Instructions: Secure dressing with tape as directed. WOUND #7: - Ankle Wound Laterality: Right, Lateral Cleanser: Soap and Water 1 x Per Day/30 Days Discharge Instructions: May shower and wash wound with dial antibacterial soap and water prior to dressing change. Cleanser: Wound Cleanser 1 x Per Day/30 Days Discharge Instructions: Cleanse the wound with wound cleanser prior to applying a clean dressing using gauze sponges, not tissue or cotton balls. Prim Dressing: keystone antibiotic compound 1 x Per Day/30 Days ary Discharge Instructions: thin layer to wound bed Secondary Dressing: ABD Pad, 8x10 (Generic) 1 x Per Day/30 Days Discharge Instructions: Apply over primary dressing as directed. Secondary Dressing: NonWoven Sponge, 4x4 (in/in) (Generic) 1 x Per Day/30 Days Discharge Instructions: Apply over primary dressing as directed Secured With: Kerlix Roll Sterile, 4.5x3.1 (in/yd) 1 x Per Day/30 Days Discharge Instructions: Secure with Kerlix as directed. Secured With: Transpore Surgical T ape, 2x10 (in/yd) 1 x Per Day/30 Days Discharge Instructions: Secure dressing with tape as directed. 1. Refill Keystone antibiotic 2. Follow-up in 2 weeks 3. In office sharp debridement Electronic Signature(s) Signed: 08/09/2021 3:05:09 PM By: Kalman Shan DO Entered By: Kalman Shan on 08/09/2021 15:03:43 -------------------------------------------------------------------------------- HxROS Details Patient Name: Date of Service: [name], [name] 08/09/2021 2:00 PM Medical Record Number: [id number] Patient Account Number: [id number] Date of Birth/Sex: Treating RN: 1965-11-29 (56 y.o. [name] Primary Care Provider: [name] Other Clinician: Referring Provider: Treating Provider/Extender: Darcey Nora in Treatment: 24 Information Obtained From Patient Hematologic/Lymphatic Medical History: Positive for: Anemia Cardiovascular Medical History: Positive for: Arrhythmia - A-Fib; Congestive Heart Failure; Hypertension Negative for: Angina Endocrine Medical History: Positive for: Type II Diabetes Past Medical History Notes: Grave's disease  Time with diabetes: over 10 years Treated with: Diet Blood sugar tested every day: No Immunological Medical History: Positive for: Lupus Erythematosus Immunizations Pneumococcal Vaccine: Received Pneumococcal Vaccination: No Implantable Devices None Family and Social History Unknown History: Yes; Never smoker; Marital Status - Single; Alcohol Use: Never; Drug Use: No History; Caffeine Use: Rarely; Financial Concerns: No; Food, Clothing or Shelter Needs: No; Support System Lacking: No; Transportation Concerns: No Electronic Signature(s) Signed: 08/09/2021 3:05:09 PM By: Kalman Shan DO Signed: 08/09/2021 4:37:55 PM By: Baruch Gouty RN, BSN Entered By: Kalman Shan on 08/09/2021 14:57:58 -------------------------------------------------------------------------------- [place] Details Patient Name: Date of Service: [name], [name] 08/09/2021 Medical Record Number: [id number] Patient Account Number: [id number] Date of Birth/Sex: Treating RN: 1966-05-18 (56 y.o. 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.2 Other local lupus erythematosus H15.05 Chronic diastolic (congestive) heart failure I10 Essential (primary) hypertension E11.621 Type 2 diabetes  mellitus with foot ulcer Facility Procedures CPT4 Code: [id number] Description: [place] - DEB SUBQ TISSUE 20 SQ CM/< ICD-10 Diagnosis Description L97.512 Non-pressure chronic ulcer of other part of right foot with fat layer exposed Modifier: Quantity: 1 CPT4 Code: [id number] Description: 11045 - DEB SUBQ TISS EA ADDL 20CM ICD-10 Diagnosis Description L97.512 Non-pressure chronic ulcer of other part of right foot with fat layer exposed Modifier: Quantity: 2 CPT4 Code: [id number] Description: 49201 - DEBRIDE WOUND 1ST 20 SQ CM OR < ICD-10 Diagnosis Description L97.512 Non-pressure chronic ulcer of other part of right foot with fat layer exposed L97.522 Non-pressure chronic ulcer of other part of left foot with fat layer exposed Modifier: Quantity: 1 CPT4 Code: [id number] Description: 58832 - DEBRIDE WOUND EA ADDL 20 SQ CM ICD-10 Diagnosis Description L97.512 Non-pressure chronic ulcer of other part of right foot with fat layer exposed L97.522 Non-pressure chronic ulcer of other part of left foot with fat layer exposed Modifier: Quantity: 8 Physician Procedures : CPT4 Code Description Modifier [id number] - WC PHYS SUBQ TISS 20 SQ CM ICD-10 Diagnosis Description L97.512 Non-pressure chronic ulcer of other part of right foot with fat layer exposed Quantity: 1 : [id number] 88110 - WC PHYS SUBQ TISS EA ADDL 20 CM ICD-10 Diagnosis Description L97.512 Non-pressure chronic ulcer of other part of right foot with fat layer exposed Quantity: 2 : [id number] 59292 - WC PHYS DEBR WO ANESTH 20 SQ CM ICD-10 Diagnosis Description L97.512 Non-pressure chronic ulcer of other part of right foot with fat layer exposed L97.522 Non-pressure chronic ulcer of other part of left foot with fat layer exposed Quantity: 1 : [id number] 81771 - WC PHYS DEBR WO ANESTH EA ADD 20 CM 8 ICD-10 Diagnosis Description L97.512 Non-pressure chronic ulcer of other part of right foot with fat layer exposed L97.522 Non-pressure chronic ulcer of other  part of left foot with fat layer  exposed Quantity: Electronic Signature(s) Signed: 08/09/2021 3:05:09 PM By: Kalman Shan DO Entered By: Kalman Shan on 08/09/2021 15:04:25

## 2021-08-09 NOTE — Progress Notes (Signed)
MADHAVI, HAMBLEN ([id number]) Visit Report for 08/09/2021 Arrival Information Details Patient Name: Date of Service: Candace Robinson, [name] 08/09/2021 2:00 PM Medical Record Number: [id number] Patient Account Number: [id number] Date of Birth/Sex: Treating RN: May 17, 1966 (56 y.o. [name], [name] Primary Care Piya Mesch: [name] Other Clinician: Referring Augusta Mirkin: Treating Taneeka Curtner/Extender: Darcey Nora in Treatment: 24 Visit Information History Since Last Visit Added or deleted any medications: No Patient Arrived: [name] Any new allergies or adverse reactions: No Arrival Time: 13:49 Had a fall or experienced change in No Accompanied By: self activities of daily living that may affect Transfer Assistance: None risk of falls: Patient Identification Verified: Yes Signs or symptoms of abuse/neglect since No Secondary Verification Process Completed: Yes last visito Patient Requires Transmission-Based No Hospitalized since last visit: No Precautions: Implantable device outside of the clinic No Patient Has Alerts: Yes excluding Patient Alerts: R ABI: 1.0 L ABI: 1.1 cellular tissue based products placed in the NO BENZOCAINE center USE LIDOCAINE GEL since last visit: ONLY Has Dressing in Place as Prescribed: Yes Has Footwear/Offloading in Place as Yes Prescribed: Left: Surgical Shoe with Pressure Relief Insole Right: Surgical Shoe with Pressure Relief Insole Pain Present Now: Yes Electronic Signature(s) Signed: 08/09/2021 4:37:55 PM By: Baruch Gouty RN, BSN Entered By: Baruch Gouty on 08/09/2021 14:04:13 -------------------------------------------------------------------------------- Encounter Discharge Information Details Patient Name: Date of Service: Candace Robinson, [name] 08/09/2021 2:00 PM Medical Record Number: [id number] Patient Account Number: [id number] Date of Birth/Sex: Treating RN: 03/23/1966 (56 y.o. Candace Robinson Primary Care Lysha Schrade: [name] Other Clinician: Referring Clista Rainford: Treating Kymberley Raz/Extender: Darcey Nora in Treatment: 24 Encounter Discharge Information Items Post Procedure Vitals Discharge Condition: Stable Temperature (F): 97.7 Ambulatory Status: Walker Pulse (bpm): 80 Discharge Destination: Home Respiratory Rate (breaths/min): 18 Transportation: Other Blood Pressure (mmHg): 103/67 Accompanied By: self Schedule Follow-up Appointment: Yes Clinical Summary of Care: Patient Declined Notes transportation service Electronic Signature(s) Signed: 08/09/2021 4:37:55 PM By: Baruch Gouty RN, BSN Entered By: Baruch Gouty on 08/09/2021 15:22:38 -------------------------------------------------------------------------------- Lower Extremity Assessment Details Patient Name: Date of Service: [name], [name] 08/09/2021 2:00 PM Medical Record Number: [id number] Patient Account Number: [id number] Date of Birth/Sex: Treating RN: [birth year] (56 y.o. Candace Robinson Primary Care Johnnie Moten: [name] Other Clinician: Referring Deniqua Perry: Treating Dallen Bunte/Extender: [name] Weeks in Treatment: 24 Edema Assessment Assessed: [Left: No] [Right: No] Edema: [Left: Yes] [Right: Yes] Calf Left: Right: Point of Measurement: 29 cm From Medial Instep 34.5 cm 35.5 cm Ankle Left: Right: Point of Measurement: 10 cm From Medial Instep 23.5 cm 24.5 cm Vascular Assessment Pulses: Dorsalis Pedis Palpable: [Left:No] [Right:No] Electronic Signature(s) Signed: 08/09/2021 4:37:55 PM By: Baruch Gouty RN, BSN Entered By: Baruch Gouty on 08/09/2021 14:08:47 -------------------------------------------------------------------------------- Multi Wound Chart Details Patient Name: Date of Service: Candace Robinson, [name] 08/09/2021 2:00 PM Medical Record Number: [id number] Patient Account Number: [id number] Date  of Birth/Sex: Treating RN: 01-14-1966 (56 y.o. Candace Robinson Primary Care Candace Robinson: [name] Other Clinician: Referring Kahiau Schewe: Treating Candace Robinson/Extender: [name] Weeks in Treatment: 24 Vital Signs Height(in): 63 Pulse(bpm): 80 Weight(lbs): 157 Blood Pressure(mmHg): 103/67 Body Mass Index(BMI): 27.8 Temperature(F): 97.7 Respiratory Rate(breaths/min): 18 Photos: [1:Right, Medial Ankle] [2:Right, Dorsal Foot] [3:Left, Medial Ankle] Wound Location: [1:Gradually Appeared] [2:Gradually Appeared] [3:Gradually Appeared] Wounding Event: [1:Diabetic Wound/Ulcer of the Lower] [2:Diabetic Wound/Ulcer of the Lower] [3:Diabetic Wound/Ulcer of the Lower] Primary Etiology: [1:Extremity  Anemia, Arrhythmia, Congestive Heart Anemia, Arrhythmia, Congestive Heart Anemia, Arrhythmia, Congestive Heart] [2:Extremity] [3:Extremity] Comorbid History: [1:Failure, Hypertension, Type II Diabetes, Lupus Erythematosus 04/03/2020] [2:Failure, Hypertension, Type II Diabetes, Lupus Erythematosus 04/03/2020] [3:Failure, Hypertension, Type II Diabetes, Lupus Erythematosus 02/22/2021] Date Acquired: [1:24] [2:24] [3:24] Weeks of Treatment: [1:Open] [2:Open] [3:Open] Wound Status: [1:No] [2:No] [3:No] Wound Recurrence: [1:7.9x6.4x0.2] [2:6x5x0.2] [3:1.6x1.4x0.2] Measurements L x W x D (cm) [1:39.71] [2:23.562] [3:1.759] A (cm) : rea [1:7.942] [2:4.712] [3:0.352] Volume (cm) : [1:-140.80%] [2:-100.00%] [3:10.40%] % Reduction in A rea: [1:-140.70%] [2:-300.00%] [3:10.40%] % Reduction in Volume: [1:Grade 2] [2:Grade 2] [3:Grade 2] Classification: [1:Medium] [2:Medium] [3:Medium] Exudate A mount: [1:Serosanguineous] [2:Serosanguineous] [3:Serosanguineous] Exudate Type: [1:red, brown] [2:red, brown] [3:red, brown] Exudate Color: [1:Yes] [2:No] [3:No] Foul Odor A Cleansing: [1:fter No] [2:N/A] [3:N/A] Odor A nticipated Due to Product Use: [1:Distinct, outline attached]  [2:Distinct, outline attached] [3:Distinct, outline attached] Wound Margin: [1:Small (1-33%)] [2:Small (1-33%)] [3:Small (1-33%)] Granulation A mount: [1:Red, Pink] [2:Pink] [3:Pink] Granulation Quality: [1:Large (67-100%)] [2:Large (67-100%)] [3:Large (67-100%)] Necrotic A mount: [1:Adherent Slough] [2:Eschar, Adherent Slough] [3:Adherent Slough] Necrotic Tissue: [1:Fat Layer (Subcutaneous Tissue): Yes Fat Layer (Subcutaneous Tissue): Yes Fat Layer (Subcutaneous Tissue): Yes] Exposed Structures: [1:Fascia: No Tendon: No Muscle: No Joint: No Bone: No None] [2:Fascia: No Tendon: No Muscle: No Joint: No Bone: No None] [3:Fascia: No Tendon: No Muscle: No Joint: No Bone: No None] Epithelialization: [1:Debridement - Excisional] [2:Debridement - Selective/Open Wound Debridement - Selective/Open Wound] Debridement: Pre-procedure Verification/Time Out 14:40 [2:14:40] [3:14:40] Taken: [1:Lidocaine 4% Topical Solution] [2:Lidocaine 4% Topical Solution] [3:Lidocaine 4% Topical Solution] Pain Control: [1:Subcutaneous, Slough] [2:Slough] [3:Slough] Tissue Debrided: [1:Skin/Subcutaneous Tissue] [2:Non-Viable Tissue] [3:Non-Viable Tissue] Level: [1:50.56] [2:30] [3:2.24] Debridement A (sq cm): [1:rea Curette] [2:Curette] [3:Curette] Instrument: [1:Minimum] [2:Minimum] [3:Minimum] Bleeding: [1:Pressure] [2:Pressure] [3:Pressure] Hemostasis A chieved: [1:4] [2:4] [3:4] Procedural Pain: [1:3] [2:3] [3:3] Post Procedural Pain: [1:Procedure was tolerated well] [2:Procedure was tolerated well] [3:Procedure was tolerated well] Debridement Treatment Response: [1:7.9x6.4x0.2] [2:6x5x0.2] [3:1.6x1.4x0.2] Post Debridement Measurements L x W x D (cm) [1:7.942] [2:4.712] [3:0.352] Post Debridement Volume: (cm) [1:Debridement] [2:Debridement] [3:Debridement] Wound Number: 4 5 6  Photos: Left, Lateral Ankle Left, Dorsal Foot Left, Distal, Medial Foot Wound Location: Gradually Appeared Gradually Appeared Gradually  Appeared Wounding Event: Diabetic Wound/Ulcer of the Lower Diabetic Wound/Ulcer of the Lower Diabetic Wound/Ulcer of the Lower Primary Etiology: Extremity Extremity Extremity Anemia, Arrhythmia, Congestive Heart Anemia, Arrhythmia, Congestive Heart Anemia, Arrhythmia, Congestive Heart Comorbid History: Failure, Hypertension, Type II Failure, Hypertension, Type II Failure, Hypertension, Type II Diabetes, Lupus Erythematosus Diabetes, Lupus Erythematosus Diabetes, Lupus Erythematosus 04/03/2020 04/03/2020 04/03/2020 Date Acquired: 24 24 24  Weeks of Treatment: Open Open Open Wound Status: No No No Wound Recurrence: 8.4x5.8x0.3 10x9x0.2 1.9x1x0.1 Measurements L x W x D (cm) 38.265 70.686 1.492 A (cm) : rea 11.479 14.137 0.149 Volume (cm) : -1.50% -17.60% -375.20% % Reduction in A rea: -52.20% -135.30% -380.60% % Reduction in Volume: Grade 2 Grade 2 Grade 2 Classification: Medium Medium Small Exudate A mount: Serosanguineous Serosanguineous Serosanguineous Exudate Type: red, brown red, brown red, brown Exudate Color: No No No Foul Odor A Cleansing: fter N/A N/A N/A Odor A nticipated Due to Product Use: Distinct, outline attached Distinct, outline attached Distinct, outline attached Wound Margin: Medium (34-66%) Small (1-33%) Small (1-33%) Granulation A mount: Red, Pink N/A N/A Granulation Quality: Medium (34-66%) Large (67-100%) Large (67-100%) Necrotic A mount: Adherent [place] Necrotic Tissue: Fat Layer (Subcutaneous Tissue): Yes Fat Layer (Subcutaneous Tissue): Yes Fat Layer (Subcutaneous Tissue): Yes Exposed Structures: Fascia: No Fascia:  No Fascia: No Tendon: No Tendon: No Tendon: No Muscle: No Muscle: No Muscle: No Joint: No Joint: No Joint: No Bone: No Bone: No Bone: No None None Small (1-33%) Epithelialization: Debridement - Selective/Open Wound Debridement - Selective/Open Wound Debridement - Selective/Open  Wound Debridement: Pre-procedure Verification/Time Out 14:40 14:40 14:40 Taken: Lidocaine 4% Topical Solution Lidocaine 4% Topical Solution Lidocaine 4% Topical Solution Pain Control: [facility] Tissue Debrided: Non-Viable Tissue Non-Viable Tissue Non-Viable Tissue Level: 48.72 90 1.9 Debridement A (sq cm): rea Curette Curette Curette Instrument: Minimum Minimum Minimum Bleeding: Pressure Pressure Pressure Hemostasis A chieved: 4 4 4  Procedural Pain: 3 3 3  Post Procedural Pain: Procedure was tolerated well Procedure was tolerated well Procedure was tolerated well Debridement Treatment Response: 8.4x5.8x0.3 10x9x0.2 1.9x1x0.1 Post Debridement Measurements L x W x D (cm) 11.479 14.137 0.149 Post Debridement Volume: (cm) Debridement Debridement Debridement Procedures Performed: Wound Number: 7 N/A N/A Photos: N/A N/A Right, Lateral Ankle N/A N/A Wound Location: Gradually Appeared N/A N/A Wounding Event: Lupus N/A N/A Primary Etiology: Anemia, Arrhythmia, Congestive Heart N/A N/A Comorbid History: Failure, Hypertension, Type II Diabetes, Lupus Erythematosus 04/08/2021 N/A N/A Date Acquired: 17 N/A N/A Weeks of Treatment: Open N/A N/A Wound Status: No N/A N/A Wound Recurrence: 1.8x2x0.2 N/A N/A Measurements L x W x D (cm) 2.827 N/A N/A A (cm) : rea 0.565 N/A N/A Volume (cm) : -9019.40% N/A N/A % Reduction in Area: -18733.30% N/A N/A % Reduction in Volume: Full Thickness Without Exposed N/A N/A Classification: Support Structures Medium N/A N/A Exudate A mount: Serosanguineous N/A N/A Exudate Type: red, brown N/A N/A Exudate Color: No N/A N/A Foul Odor A Cleansing: fter N/A N/A N/A Odor Anticipated Due to Product Use: Distinct, outline attached N/A N/A Wound Margin: Small (1-33%) N/A N/A Granulation A mount: Pink N/A N/A Granulation Quality: Large (67-100%) N/A N/A Necrotic Amount: Adherent Slough N/A N/A Necrotic  Tissue: Fat Layer (Subcutaneous Tissue): Yes N/A N/A Exposed Structures: Fascia: No Tendon: No Muscle: No Joint: No Bone: No Small (1-33%) N/A N/A Epithelialization: N/A N/A N/A Debridement: N/A N/A N/A Pain Control: N/A N/A N/A Tissue Debrided: N/A N/A N/A Level: N/A N/A N/A Debridement A (sq cm): rea N/A N/A N/A Instrument: N/A N/A N/A Bleeding: N/A N/A N/A Hemostasis A chieved: N/A N/A N/A Procedural Pain: N/A N/A N/A Post Procedural Pain: N/A N/A N/A Debridement Treatment Response: N/A N/A N/A Post Debridement Measurements L x W x D (cm) N/A N/A N/A Post Debridement Volume: (cm) N/A N/A N/A Procedures Performed: Treatment Notes Electronic Signature(s) Signed: 08/09/2021 3:05:09 PM By: Candace Shan DO Signed: 08/09/2021 4:37:55 PM By: Baruch Gouty RN, BSN Entered By: Candace Robinson on 08/09/2021 14:57:03 -------------------------------------------------------------------------------- Multi-Disciplinary Care Plan Details Patient Name: Date of Service: Candace Robinson, [name] 08/09/2021 2:00 PM Medical Record Number: [id number] Patient Account Number: [id number] Date of Birth/Sex: Treating RN: 02-Jun-1966 (56 y.o. Candace Robinson Primary Care Ayansh Feutz: [name] Other Clinician: Referring Maston Wight: Treating Omere Marti/Extender: Darcey Nora in Treatment: 24 Multidisciplinary Care Plan reviewed with physician Active Inactive Wound/Skin Impairment Nursing Diagnoses: Impaired tissue integrity Goals: Patient/caregiver will verbalize understanding of skin care regimen Date Initiated: 02/22/2021 Target Resolution Date: 08/27/2021 Goal Status: Active Interventions: Assess patient/caregiver ability to obtain necessary supplies Assess patient/caregiver ability to perform ulcer/skin care regimen upon admission and as needed Assess ulceration(s) every visit Provide education on ulcer and skin care Treatment  Activities: Topical wound management initiated : 02/22/2021 Notes: 03/25/21: All wounds not yet at 30%,  culture positive. Using gent cream. 05/31/21: Wound regimen continues, patient has not been compliant with all aspects of wound treatment. Electronic Signature(s) Signed: 08/09/2021 4:37:55 PM By: Baruch Gouty RN, BSN Entered By: Baruch Gouty on 08/09/2021 14:33:54 -------------------------------------------------------------------------------- Pain Assessment Details Patient Name: Date of Service: [name], [name] 08/09/2021 2:00 PM Medical Record Number: [id number] Patient Account Number: [id number] Date of Birth/Sex: Treating RN: 1966-03-26 (56 y.o. Candace Robinson Primary Care Toccara Alford: [name] Other Clinician: Referring Larissa Pegg: Treating Elhadji Pecore/Extender: Darcey Nora in Treatment: 24 Active Problems Location of Pain Severity and Description of Pain Patient Has Paino Yes Site Locations Pain Location: Pain in Ulcers With Dressing Change: Yes Duration of the Pain. Constant / Intermittento Constant Rate the pain. Current Pain Level: 4 Worst Pain Level: 10 Least Pain Level: 3 Character of Pain Describe the Pain: Aching, Throbbing Pain Management and Medication Current Pain Management: Medication: Yes Rest: Yes How does your wound impact your activities of daily livingo Sleep: Yes Emotions: Yes Electronic Signature(s) Signed: 08/09/2021 4:37:55 PM By: Baruch Gouty RN, BSN Entered By: Baruch Gouty on 08/09/2021 14:07:13 -------------------------------------------------------------------------------- Patient/Caregiver Education Details Patient Name: Date of Service: [name], [name] 2/6/2023andnbsp2:00 PM Medical Record Number: [id number] Patient Account Number: [id number] Date of Birth/Gender: Treating RN: 1965-07-28 (56 y.o. Candace Robinson Primary Care Physician: [name] Other  Clinician: Referring Physician: Treating Physician/Extender: Darcey Nora in Treatment: 24 Education Assessment Education Provided To: Patient Education Topics Provided Venous: Methods: Explain/Verbal Responses: Reinforcements needed, State content correctly Wound/Skin Impairment: Methods: Explain/Verbal Responses: Reinforcements needed, State content correctly Electronic Signature(s) Signed: 08/09/2021 4:37:55 PM By: Baruch Gouty RN, BSN Entered By: Baruch Gouty on 08/09/2021 14:34:20 -------------------------------------------------------------------------------- Wound Assessment Details Patient Name: Date of Service: [name], [name] 08/09/2021 2:00 PM Medical Record Number: [id number] Patient Account Number: [id number] Date of Birth/Sex: Treating RN: 1965/10/06 (56 y.o. [name], [name] Primary Care Candace Robinson: [name] Other Clinician: Referring Arby Dahir: Treating Jacqeline Broers/Extender: [name] Weeks in Treatment: 24 Wound Status Wound Number: 1 Primary Diabetic Wound/Ulcer of the Lower Extremity Etiology: Wound Location: Right, Medial Ankle Wound Open Wounding Event: Gradually Appeared Status: Date Acquired: 04/03/2020 Comorbid Anemia, Arrhythmia, Congestive Heart Failure, Hypertension, Weeks Of Treatment: 24 History: Type II Diabetes, Lupus Erythematosus Clustered Wound: No Photos Wound Measurements Length: (cm) 7.9 Width: (cm) 6.4 Depth: (cm) 0.2 Area: (cm) 39.71 Volume: (cm) 7.942 % Reduction in Area: -140.8% % Reduction in Volume: -140.7% Epithelialization: None Tunneling: No Undermining: No Wound Description Classification: Grade 2 Wound Margin: Distinct, outline attached Exudate Amount: Medium Exudate Type: Serosanguineous Exudate Color: red, brown Foul Odor After Cleansing: Yes Due to Product Use: No Slough/Fibrino Yes Wound Bed Granulation Amount: Small (1-33%) Exposed  Structure Granulation Quality: Red, Pink Fascia Exposed: No Necrotic Amount: Large (67-100%) Fat Layer (Subcutaneous Tissue) Exposed: Yes Necrotic Quality: Adherent Slough Tendon Exposed: No Muscle Exposed: No Joint Exposed: No Bone Exposed: No Treatment Notes Wound #1 (Ankle) Wound Laterality: Right, Medial Cleanser Soap and Water Discharge Instruction: May shower and wash wound with dial antibacterial soap and water prior to dressing change. Wound Cleanser Discharge Instruction: Cleanse the wound with wound cleanser prior to applying a clean dressing using gauze sponges, not tissue or cotton balls. Peri-Wound Care Topical Primary Dressing keystone antibiotic compound Discharge Instruction: thin layer to wound bed Secondary Dressing ABD Pad, 8x10 Discharge Instruction: Apply over primary dressing as directed. NonWoven Sponge, 4x4 (in/in) Discharge  Instruction: Apply over primary dressing as directed Secured With [employer] Sterile, 4.5x3.1 (in/yd) Discharge Instruction: Secure with Kerlix as directed. Transpore Surgical Tape, 2x10 (in/yd) Discharge Instruction: Secure dressing with tape as directed. Compression Wrap Compression Stockings Add-Ons Electronic Signature(s) Signed: 08/09/2021 4:37:55 PM By: Baruch Gouty RN, BSN Entered By: Baruch Gouty on 08/09/2021 14:19:38 -------------------------------------------------------------------------------- Wound Assessment Details Patient Name: Date of Service: [name], [name] 08/09/2021 2:00 PM Medical Record Number: [id number] Patient Account Number: [id number] Date of Birth/Sex: Treating RN: 1965-09-08 (56 y.o. [name], [name] Primary Care Kajuan Guyton: [name] Other Clinician: Referring Enisa Runyan: Treating Eastyn Dattilo/Extender: [name] Weeks in Treatment: 24 Wound Status Wound Number: 2 Primary Diabetic Wound/Ulcer of the Lower Extremity Etiology: Wound Location: Right,  Dorsal Foot Wound Open Wounding Event: Gradually Appeared Status: Date Acquired: 04/03/2020 Comorbid Anemia, Arrhythmia, Congestive Heart Failure, Hypertension, Weeks Of Treatment: 24 History: Type II Diabetes, Lupus Erythematosus Clustered Wound: No Photos Wound Measurements Length: (cm) 6 Width: (cm) 5 Depth: (cm) 0.2 Area: (cm) 23.562 Volume: (cm) 4.712 % Reduction in Area: -100% % Reduction in Volume: -300% Epithelialization: None Tunneling: No Undermining: No Wound Description Classification: Grade 2 Wound Margin: Distinct, outline attached Exudate Amount: Medium Exudate Type: Serosanguineous Exudate Color: red, brown Foul Odor After Cleansing: No Slough/Fibrino Yes Wound Bed Granulation Amount: Small (1-33%) Exposed Structure Granulation Quality: Pink Fascia Exposed: No Necrotic Amount: Large (67-100%) Fat Layer (Subcutaneous Tissue) Exposed: Yes Necrotic Quality: Eschar, Adherent Slough Tendon Exposed: No Muscle Exposed: No Joint Exposed: No Bone Exposed: No Treatment Notes Wound #2 (Foot) Wound Laterality: Dorsal, Right Cleanser Soap and Water Discharge Instruction: May shower and wash wound with dial antibacterial soap and water prior to dressing change. Wound Cleanser Discharge Instruction: Cleanse the wound with wound cleanser prior to applying a clean dressing using gauze sponges, not tissue or cotton balls. Peri-Wound Care Topical Primary Dressing keystone antibiotic compound Discharge Instruction: thin layer to wound bed Secondary Dressing ABD Pad, 8x10 Discharge Instruction: Apply over primary dressing as directed. NonWoven Sponge, 4x4 (in/in) Discharge Instruction: Apply over primary dressing as directed Secured With Kerlix Roll Sterile, 4.5x3.1 (in/yd) Discharge Instruction: Secure with Kerlix as directed. Transpore Surgical Tape, 2x10 (in/yd) Discharge Instruction: Secure dressing with tape as directed. Compression Wrap Compression  Stockings Add-Ons Electronic Signature(s) Signed: 08/09/2021 4:37:55 PM By: Baruch Gouty RN, BSN Signed: 08/09/2021 4:37:55 PM By: Baruch Gouty RN, BSN Entered By: Baruch Gouty on 08/09/2021 14:21:46 -------------------------------------------------------------------------------- Wound Assessment Details Patient Name: Date of Service: [name], [name] 08/09/2021 2:00 PM Medical Record Number: [id number] Patient Account Number: [id number] Date of Birth/Sex: Treating RN: [birth year] (56 y.o. [name], [name] Primary Care Vondell Sowell: [name] Other Clinician: Referring Cristy Colmenares: Treating Candace Robinson/Extender: [name] Weeks in Treatment: 24 Wound Status Wound Number: 3 Primary Diabetic Wound/Ulcer of the Lower Extremity Etiology: Wound Location: Left, Medial Ankle Wound Open Wounding Event: Gradually Appeared Status: Date Acquired: 02/22/2021 Comorbid Anemia, Arrhythmia, Congestive Heart Failure, Hypertension, Weeks Of Treatment: 24 History: Type II Diabetes, Lupus Erythematosus Clustered Wound: No Photos Wound Measurements Length: (cm) 1.6 Width: (cm) 1.4 Depth: (cm) 0.2 Area: (cm) 1.759 Volume: (cm) 0.352 % Reduction in Area: 10.4% % Reduction in Volume: 10.4% Epithelialization: None Tunneling: No Undermining: No Wound Description Classification: Grade 2 Wound Margin: Distinct, outline attached Exudate Amount: Medium Exudate Type: Serosanguineous Exudate Color: red, brown Foul Odor After Cleansing: No Slough/Fibrino Yes Wound Bed Granulation Amount: Small (1-33%) Exposed Structure Granulation Quality: Pink Fascia Exposed:  No Necrotic Amount: Large (67-100%) Fat Layer (Subcutaneous Tissue) Exposed: Yes Necrotic Quality: Adherent Slough Tendon Exposed: No Muscle Exposed: No Joint Exposed: No Bone Exposed: No Treatment Notes Wound #3 (Ankle) Wound Laterality: Left, Medial Cleanser Soap and Water Discharge  Instruction: May shower and wash wound with dial antibacterial soap and water prior to dressing change. Wound Cleanser Discharge Instruction: Cleanse the wound with wound cleanser prior to applying a clean dressing using gauze sponges, not tissue or cotton balls. Peri-Wound Care Topical Primary Dressing keystone antibiotic compound Discharge Instruction: thin layer to wound bed Secondary Dressing ABD Pad, 8x10 Discharge Instruction: Apply over primary dressing as directed. NonWoven Sponge, 4x4 (in/in) Discharge Instruction: Apply over primary dressing as directed Secured With Kerlix Roll Sterile, 4.5x3.1 (in/yd) Discharge Instruction: Secure with Kerlix as directed. Transpore Surgical Tape, 2x10 (in/yd) Discharge Instruction: Secure dressing with tape as directed. Compression Wrap Compression Stockings Add-Ons Electronic Signature(s) Signed: 08/09/2021 4:37:55 PM By: Baruch Gouty RN, BSN Entered By: Baruch Gouty on 08/09/2021 14:23:05 -------------------------------------------------------------------------------- Wound Assessment Details Patient Name: Date of Service: [name], [name] 08/09/2021 2:00 PM Medical Record Number: [id number] Patient Account Number: [id number] Date of Birth/Sex: Treating RN: 05-Aug-1965 (56 y.o. [name], [name] Primary Care Makaylynn Bonillas: [name] Other Clinician: Referring Beatriz Settles: Treating Marlyn Tondreau/Extender: [name] Weeks in Treatment: 24 Wound Status Wound Number: 4 Primary Diabetic Wound/Ulcer of the Lower Extremity Etiology: Wound Location: Left, Lateral Ankle Wound Open Wounding Event: Gradually Appeared Status: Date Acquired: 04/03/2020 Comorbid Anemia, Arrhythmia, Congestive Heart Failure, Hypertension, Weeks Of Treatment: 24 History: Type II Diabetes, Lupus Erythematosus Clustered Wound: No Photos Wound Measurements Length: (cm) 8.4 Width: (cm) 5.8 Depth: (cm) 0.3 Area: (cm)  38.265 Volume: (cm) 11.479 % Reduction in Area: -1.5% % Reduction in Volume: -52.2% Epithelialization: None Tunneling: No Undermining: No Wound Description Classification: Grade 2 Wound Margin: Distinct, outline attached Exudate Amount: Medium Exudate Type: Serosanguineous Exudate Color: red, brown Foul Odor After Cleansing: No Slough/Fibrino Yes Wound Bed Granulation Amount: Medium (34-66%) Exposed Structure Granulation Quality: Red, Pink Fascia Exposed: No Necrotic Amount: Medium (34-66%) Fat Layer (Subcutaneous Tissue) Exposed: Yes Necrotic Quality: Adherent Slough Tendon Exposed: No Muscle Exposed: No Joint Exposed: No Bone Exposed: No Treatment Notes Wound #4 (Ankle) Wound Laterality: Left, Lateral Cleanser Soap and Water Discharge Instruction: May shower and wash wound with dial antibacterial soap and water prior to dressing change. Wound Cleanser Discharge Instruction: Cleanse the wound with wound cleanser prior to applying a clean dressing using gauze sponges, not tissue or cotton balls. Peri-Wound Care Topical Primary Dressing keystone antibiotic compound Discharge Instruction: thin layer to wound bed Secondary Dressing ABD Pad, 8x10 Discharge Instruction: Apply over primary dressing as directed. NonWoven Sponge, 4x4 (in/in) Discharge Instruction: Apply over primary dressing as directed Secured With Kerlix Roll Sterile, 4.5x3.1 (in/yd) Discharge Instruction: Secure with Kerlix as directed. Transpore Surgical Tape, 2x10 (in/yd) Discharge Instruction: Secure dressing with tape as directed. Compression Wrap Compression Stockings Add-Ons Electronic Signature(s) Signed: 08/09/2021 4:37:55 PM By: Baruch Gouty RN, BSN Entered By: Baruch Gouty on 08/09/2021 14:24:19 -------------------------------------------------------------------------------- Wound Assessment Details Patient Name: Date of Service: [name], [name] 08/09/2021 2:00 PM Medical  Record Number: [id number] Patient Account Number: [id number] Date of Birth/Sex: Treating RN: 11-21-1965 (56 y.o. Candace Robinson Primary Care Aldine Grainger: [name] Other Clinician: Referring Javaeh Muscatello: Treating Peyten Weare/Extender: [name] Weeks in Treatment: 24 Wound Status Wound Number: 5 Primary Diabetic Wound/Ulcer of the Lower Extremity Etiology: Wound Location: Left,  Dorsal Foot Wound Open Wounding Event: Gradually Appeared Status: Date Acquired: 04/03/2020 Comorbid Anemia, Arrhythmia, Congestive Heart Failure, Hypertension, Weeks Of Treatment: 24 History: Type II Diabetes, Lupus Erythematosus Clustered Wound: No Photos Wound Measurements Length: (cm) 10 Width: (cm) 9 Depth: (cm) 0.2 Area: (cm) 70.686 Volume: (cm) 14.137 % Reduction in Area: -17.6% % Reduction in Volume: -135.3% Epithelialization: None Tunneling: No Undermining: No Wound Description Classification: Grade 2 Wound Margin: Distinct, outline attached Exudate Amount: Medium Exudate Type: Serosanguineous Exudate Color: red, brown Foul Odor After Cleansing: No Slough/Fibrino Yes Wound Bed Granulation Amount: Small (1-33%) Exposed Structure Necrotic Amount: Large (67-100%) Fascia Exposed: No Necrotic Quality: Adherent Slough Fat Layer (Subcutaneous Tissue) Exposed: Yes Tendon Exposed: No Muscle Exposed: No Joint Exposed: No Bone Exposed: No Treatment Notes Wound #5 (Foot) Wound Laterality: Dorsal, Left Cleanser Soap and Water Discharge Instruction: May shower and wash wound with dial antibacterial soap and water prior to dressing change. Wound Cleanser Discharge Instruction: Cleanse the wound with wound cleanser prior to applying a clean dressing using gauze sponges, not tissue or cotton balls. Peri-Wound Care Topical Primary Dressing keystone antibiotic compound Discharge Instruction: thin layer to wound bed Secondary Dressing ABD Pad, 8x10 Discharge  Instruction: Apply over primary dressing as directed. NonWoven Sponge, 4x4 (in/in) Discharge Instruction: Apply over primary dressing as directed Secured With Kerlix Roll Sterile, 4.5x3.1 (in/yd) Discharge Instruction: Secure with Kerlix as directed. Transpore Surgical Tape, 2x10 (in/yd) Discharge Instruction: Secure dressing with tape as directed. Compression Wrap Compression Stockings Add-Ons Electronic Signature(s) Signed: 08/09/2021 4:37:55 PM By: Baruch Gouty RN, BSN Entered By: Baruch Gouty on 08/09/2021 14:25:19 -------------------------------------------------------------------------------- Wound Assessment Details Patient Name: Date of Service: [name], [name] 08/09/2021 2:00 PM Medical Record Number: [id number] Patient Account Number: [id number] Date of Birth/Sex: Treating RN: Aug 08, 1965 (56 y.o. [name], [name] Primary Care Candace Robinson: [name] Other Clinician: Referring Candace Robinson: Treating Candace Robinson/Extender: [name] Weeks in Treatment: 24 Wound Status Wound Number: 6 Primary Diabetic Wound/Ulcer of the Lower Extremity Etiology: Wound Location: Left, Distal, Medial Foot Wound Open Wounding Event: Gradually Appeared Status: Date Acquired: 04/03/2020 Comorbid Anemia, Arrhythmia, Congestive Heart Failure, Hypertension, Weeks Of Treatment: 24 History: Type II Diabetes, Lupus Erythematosus Clustered Wound: No Photos Wound Measurements Length: (cm) 1.9 Width: (cm) 1 Depth: (cm) 0.1 Area: (cm) 1.492 Volume: (cm) 0.149 % Reduction in Area: -375.2% % Reduction in Volume: -380.6% Epithelialization: Small (1-33%) Tunneling: No Undermining: No Wound Description Classification: Grade 2 Wound Margin: Distinct, outline attached Exudate Amount: Small Exudate Type: Serosanguineous Exudate Color: red, brown Foul Odor After Cleansing: No Slough/Fibrino Yes Wound Bed Granulation Amount: Small (1-33%) Exposed  Structure Necrotic Amount: Large (67-100%) Fascia Exposed: No Necrotic Quality: Adherent Slough Fat Layer (Subcutaneous Tissue) Exposed: Yes Tendon Exposed: No Muscle Exposed: No Joint Exposed: No Bone Exposed: No Treatment Notes Wound #6 (Foot) Wound Laterality: Left, Medial, Distal Cleanser Soap and Water Discharge Instruction: May shower and wash wound with dial antibacterial soap and water prior to dressing change. Wound Cleanser Discharge Instruction: Cleanse the wound with wound cleanser prior to applying a clean dressing using gauze sponges, not tissue or cotton balls. Peri-Wound Care Topical Primary Dressing keystone antibiotic compound Discharge Instruction: thin layer to wound bed Secondary Dressing ABD Pad, 8x10 Discharge Instruction: Apply over primary dressing as directed. NonWoven Sponge, 4x4 (in/in) Discharge Instruction: Apply over primary dressing as directed Secured With Kerlix Roll Sterile, 4.5x3.1 (in/yd) Discharge Instruction: Secure with Kerlix as directed. Transpore Surgical Tape, 2x10 (in/yd) Discharge Instruction: Secure dressing  with tape as directed. Compression Wrap Compression Stockings Add-Ons Electronic Signature(s) Signed: 08/09/2021 4:37:55 PM By: Baruch Gouty RN, BSN Entered By: Baruch Gouty on 08/09/2021 14:26:01 -------------------------------------------------------------------------------- Wound Assessment Details Patient Name: Date of Service: [name], [name] 08/09/2021 2:00 PM Medical Record Number: [id number] Patient Account Number: [id number] Date of Birth/Sex: Treating RN: 05-Nov-1965 (56 y.o. [name], [name] Primary Care Candace Robinson: [name] Other Clinician: Referring Candace Robinson: Treating Candace Robinson/Extender: [name] Weeks in Treatment: 24 Wound Status Wound Number: 7 Primary Lupus Etiology: Wound Location: Right, Lateral Ankle Wound Open Wounding Event: Gradually  Appeared Status: Date Acquired: 04/08/2021 Comorbid Anemia, Arrhythmia, Congestive Heart Failure, Hypertension, Weeks Of Treatment: 17 History: Type II Diabetes, Lupus Erythematosus Clustered Wound: No Photos Wound Measurements Length: (cm) 1.8 Width: (cm) 2 Depth: (cm) 0.2 Area: (cm) 2.827 Volume: (cm) 0.565 % Reduction in Area: -9019.4% % Reduction in Volume: -18733.3% Epithelialization: Small (1-33%) Wound Description Classification: Full Thickness Without Exposed Support Structures Wound Margin: Distinct, outline attached Exudate Amount: Medium Exudate Type: Serosanguineous Exudate Color: red, brown Foul Odor After Cleansing: No Slough/Fibrino Yes Wound Bed Granulation Amount: Small (1-33%) Exposed Structure Granulation Quality: Pink Fascia Exposed: No Necrotic Amount: Large (67-100%) Fat Layer (Subcutaneous Tissue) Exposed: Yes Necrotic Quality: Adherent Slough Tendon Exposed: No Muscle Exposed: No Joint Exposed: No Bone Exposed: No Treatment Notes Wound #7 (Ankle) Wound Laterality: Right, Lateral Cleanser Soap and Water Discharge Instruction: May shower and wash wound with dial antibacterial soap and water prior to dressing change. Wound Cleanser Discharge Instruction: Cleanse the wound with wound cleanser prior to applying a clean dressing using gauze sponges, not tissue or cotton balls. Peri-Wound Care Topical Primary Dressing keystone antibiotic compound Discharge Instruction: thin layer to wound bed Secondary Dressing ABD Pad, 8x10 Discharge Instruction: Apply over primary dressing as directed. NonWoven Sponge, 4x4 (in/in) Discharge Instruction: Apply over primary dressing as directed Secured With Kerlix Roll Sterile, 4.5x3.1 (in/yd) Discharge Instruction: Secure with Kerlix as directed. Transpore Surgical Tape, 2x10 (in/yd) Discharge Instruction: Secure dressing with tape as directed. Compression Wrap Compression Stockings Add-Ons Electronic  Signature(s) Signed: 08/09/2021 4:37:55 PM By: Baruch Gouty RN, BSN Signed: 08/09/2021 4:37:55 PM By: Baruch Gouty RN, BSN Entered By: Baruch Gouty on 08/09/2021 14:26:40 -------------------------------------------------------------------------------- Vitals Details Patient Name: Date of Service: BRO Candace Robinson, [name] 08/09/2021 2:00 PM Medical Record Number: [id number] Patient Account Number: [id number] Date of Birth/Sex: Treating RN: 1965-12-11 (56 y.o. Candace Robinson Primary Care Kristeena Meineke: [name] Other Clinician: Referring Jady Braggs: Treating Quavion Boule/Extender: Darcey Nora in Treatment: 24 Vital Signs Time Taken: 14:04 Temperature (F): 97.7 Height (in): 63 Pulse (bpm): 80 Weight (lbs): 157 Respiratory Rate (breaths/min): 18 Body Mass Index (BMI): 27.8 Blood Pressure (mmHg): 103/67 Reference Range: 80 - 120 mg / dl Electronic Signature(s) Signed: 08/09/2021 4:37:55 PM By: Baruch Gouty RN, BSN Entered By: Baruch Gouty on 08/09/2021 14:04:40

## 2021-08-11 ENCOUNTER — Ambulatory Visit
Admission: RE | Admit: 2021-08-11 | Discharge: 2021-08-11 | Disposition: A | Discharge status: Home / Self Care | Payer: Medicaid Other | Source: Ambulatory Visit | Attending: Nephrology | Admitting: Nephrology

## 2021-08-11 DIAGNOSIS — N179 Acute kidney failure, unspecified: Secondary | ICD-10-CM

## 2021-08-11 DIAGNOSIS — I10 Essential (primary) hypertension: Secondary | ICD-10-CM

## 2021-08-20 ENCOUNTER — Telehealth (INDEPENDENT_AMBULATORY_CARE_PROVIDER_SITE_OTHER): Payer: Medicaid Other | Admitting: Nurse Practitioner

## 2021-08-20 ENCOUNTER — Encounter: Payer: Self-pay | Admitting: Nurse Practitioner

## 2021-08-20 VITALS — BP 131/80 | HR 70 | Ht 63.0 in | Wt 160.0 lb

## 2021-08-20 DIAGNOSIS — E890 Postprocedural hypothyroidism: Secondary | ICD-10-CM

## 2021-08-20 NOTE — Progress Notes (Signed)
08/20/2021     Endocrinology Follow Up Note     TELEHEALTH VISIT: The patient is being engaged in telehealth visit due to COVID-19.  This type of visit limits physical examination significantly, and thus is not preferable over face-to-face encounters.  I connected with  Candace Robinson on 08/20/21 by a video enabled telemedicine application and verified that I am speaking with the correct person using two identifiers.   I discussed the limitations of evaluation and management by telemedicine. The patient expressed understanding and agreed to proceed.    The participants involved in this visit include: Brita Romp, NP located at [facility] and Candace Robinson  located at their personal residence listed.   Subjective:    Patient ID: Candace Robinson, female    DOB: 1965-08-15, PCP Candace Douglas, MD.   Past Medical History:  Diagnosis Date   Anemia    Atrial fibrillation ([place])    CHF (congestive heart failure) ([place])    Diabetes mellitus without complication ([place])    Heart murmur    Hypertension    Lupus ([place])    Thyroid disease    hyperthyroid    Past Surgical History:  Procedure Laterality Date   ABLATION Right    approximately 2015. right lower leg   TONSILLECTOMY     age 63    Social History   Socioeconomic History   Marital status: Single    Spouse name: Not on file   Number of children: Not on file   Years of education: Not on file   Highest education level: Not on file  Occupational History   Not on file  Tobacco Use   Smoking status: Never   Smokeless tobacco: Never  Vaping Use   Vaping Use: Never used  Substance and Sexual Activity   Alcohol use: Not Currently   Drug use: Not Currently   Sexual activity: Not on file  Other Topics Concern   Not on file  Social History Narrative   Not on file   Social Determinants of Health   Financial Resource Strain: Not on file  Food Insecurity: Not on  file  Transportation Needs: Not on file  Physical Activity: Not on file  Stress: Not on file  Social Connections: Not on file    Family History  Problem Relation Age of Onset   Hypertension Mother    Diabetes Mother    COPD Mother    Glaucoma Mother    Cancer - Colon Father    Diabetes Sister    Glaucoma Sister    Uveitis Daughter    Lupus Cousin        systemic   Lupus Cousin        systemic   Lupus Cousin        cutaneous    Outpatient Encounter Medications as of 08/20/2021  Medication Sig   acetaminophen (TYLENOL) 325 MG tablet Take 2 tablets (650 mg total) by mouth every 6 (six) hours. (Patient taking differently: Take 500 mg by mouth 2 (two) times daily.)   amLODipine (NORVASC) 10 MG tablet Take 10 mg by mouth daily.   ASPIRIN LOW DOSE 81 MG EC tablet Take 81 mg by mouth daily.   atorvastatin (LIPITOR) 20 MG tablet Take 20 mg by mouth daily.   colistimethate (COLYMYCIN) 150 MG injection Add contents of 2 vials Colistimethate (300mg ) and 1 capsule in the mixing bottle with 10 pumps of Bassa-Gel.Mix well and apply to affected area(s)  daily.   ferrous sulfate 325 (65 FE) MG tablet Take 1 tablet (325 mg total) by mouth in the morning and at bedtime.   hydrOXYzine (ATARAX/VISTARIL) 25 MG tablet Take 25 mg by mouth every 8 (eight) hours as needed.   levothyroxine (SYNTHROID) 75 MCG tablet Take 75 mcg by mouth every morning.   lisinopril (ZESTRIL) 10 MG tablet Take 10 mg by mouth daily.   Multiple Vitamins-Minerals (SENTRY SENIOR/LUTEIN PO) Take 1 tablet by mouth daily in the afternoon.   NONFORMULARY OR COMPOUNDED ITEM Compunded med for wound Bassa gel with Gentamycin   omeprazole (PRILOSEC) 20 MG capsule Take 20 mg by mouth 2 (two) times daily.   ondansetron (ZOFRAN) 4 MG tablet Take 4 mg by mouth as needed.   SANTYL ointment Apply 1 application topically daily.   silver sulfADIAZINE (SILVADENE) 1 % cream Apply topically daily.   tacrolimus (PROTOPIC) 0.1 % ointment Apply  topically as needed.   traMADol HCl 100 MG TABS Take 1 tablet by mouth every 8 (eight) hours.   zinc gluconate 50 MG tablet Take 50 mg by mouth daily.   No facility-administered encounter medications on file as of 08/20/2021.    ALLERGIES: Allergies  Allergen Reactions   Motrin [Ibuprofen] Other (See Comments)    hallucinations    VACCINATION STATUS: Immunization History  Administered Date(s) Administered   PFIZER(Purple Top)SARS-COV-2 Vaccination 09/12/2019, 06/30/2020     HPI  Candace Robinson is 56 y.o. female who presents today with a medical history as above. she is being seen in follow up after being seen in consultation for hyperthyroidism requested by Candace Douglas, MD.  she was recently hospitalized for complications with nonhealing wounds on her bilateral feet.  She developed altered mental status and thus thyroid labs were performed showing severely suppressed TSH and high Free T3 levels, in impending thyroid storm.  She was treated with high dose of Methimazole 30 mg po daily and sent to the [facility] for rehabilitation where she has since been sent home.  She also had baseline thyroid ultrasound which did not show any suspicious nodules.  she currently denies dysphagia, choking, shortness of breath, no recent voice change.    she denies family history of thyroid dysfunction and denies family hx of thyroid cancer. she denies personal history of goiter. she is on any anti-thyroid medications and propanolol for symptom management. Denies use of Biotin containing supplements.    Once her thyroid labs stabilized, we were able to taper and stop her Methimazole and proceed with NM uptake and scan which confirmed Graves disease.  She is s/p RAI therapy on 12/11/20.  She has since started on thyroid hormone replacement therapy.   Review of systems  Constitutional: + Minimally fluctuating body weight,  current Body mass index is 28.34 kg/m. , + fatigue, no subjective  hyperthermia, no subjective hypothermia Eyes: no blurry vision, no xerophthalmia ENT: no sore throat, no nodules palpated in throat, no dysphagia/odynophagia, no hoarseness Cardiovascular: no chest pain, no shortness of breath, no palpitations, + leg swelling Respiratory: no cough, no shortness of breath Gastrointestinal: no nausea/vomiting/diarrhea Musculoskeletal: no muscle/joint aches Skin: no rashes, no hyperemia, still has wounds to bilateral feet- improving slowly (surgical procedure scheduled for March) Neurological: no tremors, no numbness, no tingling, no dizziness Psychiatric: no depression, no anxiety   Objective:    BP 131/80    Pulse 70    Ht 5\' 3"  (1.6 m)    Wt 160 lb (72.6 kg) Comment: Weighed at home  for virtual visit   BMI 28.34 kg/m   Wt Readings from Last 3 Encounters:  08/20/21 160 lb (72.6 kg)  08/04/21 163 lb 6.4 oz (74.1 kg)  07/30/21 159 lb (72.1 kg)     BP Readings from Last 3 Encounters:  08/20/21 131/80  08/04/21 107/70  07/30/21 134/88    Physical Exam- Telehealth- significantly limited due to nature of visit  Constitutional: Body mass index is 28.34 kg/m. , not in acute distress, normal state of mind Respiratory: Adequate breathing efforts   CMP     Component Value Date/Time   NA 132 (L) 07/06/2021 1111   K 4.0 07/06/2021 1111   CL 97 (L) 07/06/2021 1111   CO2 25 07/06/2021 1111   GLUCOSE 72 07/06/2021 1111   BUN 27 (H) 07/06/2021 1111   CREATININE 1.80 (H) 07/06/2021 1111   CALCIUM 9.4 07/06/2021 1111   PROT 9.8 (H) 07/06/2021 1111   ALBUMIN 2.1 (L) 09/10/2020 0243   AST 25 07/06/2021 1111   ALT 9 07/06/2021 1111   ALKPHOS 146 (H) 09/10/2020 0243   BILITOT 0.3 07/06/2021 1111   GFRNONAA >60 09/17/2020 0247     CBC    Component Value Date/Time   WBC 8.7 07/06/2021 1111   RBC 2.83 (L) 07/06/2021 1111   HGB 7.0 (L) 07/06/2021 1111   HCT 24.1 (L) 07/06/2021 1111   PLT 299 07/06/2021 1111   MCV 85.2 07/06/2021 1111   MCH 24.7  (L) 07/06/2021 1111   MCHC 29.0 (L) 07/06/2021 1111   RDW 16.4 (H) 07/06/2021 1111   LYMPHSABS 783 (L) 07/06/2021 1111   MONOABS 0.4 09/12/2020 0329   EOSABS 17 07/06/2021 1111   BASOSABS 17 07/06/2021 1111     Diabetic Labs (most recent): Lab Results  Component Value Date   HGBA1C 5.6 09/01/2020    Lipid Panel     Component Value Date/Time   CHOL 77 09/01/2020 0342   TRIG 52 09/01/2020 0342   HDL 20 (L) 09/01/2020 0342   CHOLHDL 3.9 09/01/2020 0342   VLDL 10 09/01/2020 0342   LDLCALC 47 09/01/2020 0342     Lab Results  Component Value Date   TSH 56.600 (H) 07/16/2021   TSH 98.600 (H) 04/13/2021   TSH 0.012 (L) 02/05/2021   TSH 0.01 (A) 11/18/2020   TSH 0.029 (L) 10/09/2020   TSH 0.018 (L) 10/08/2020   TSH <0.010 (L) 09/06/2020   TSH <0.010 (L) 09/04/2020   TSH <0.010 (L) 08/31/2020   FREET4 0.92 07/16/2021   FREET4 0.22 (L) 04/13/2021   FREET4 1.01 02/05/2021   FREET4 0.73 10/09/2020   FREET4 0.78 10/08/2020   FREET4 1.40 (H) 09/06/2020   FREET4 1.72 (H) 09/04/2020   FREET4 2.53 (H) 08/31/2020     Results for SUNNY, AGUON (MRN [id number]) as of 04/19/2021 11:04  Ref. Range 10/09/2020 08:13 10/09/2020 08:14 11/18/2020 00:00 02/05/2021 09:04 04/13/2021 09:17  TSH Latest Ref Range: 0.450 - 4.500 uIU/mL 0.029 (L)  0.01 (A) 0.012 (L) 98.600 (H)  Triiodothyronine,Free,Serum Latest Ref Range: 2.0 - 4.4 pg/mL  1.5 (L)     T4,Free(Direct) Latest Ref Range: 0.82 - 1.77 ng/dL 0.73   1.01 0.22 (L)  Thyroperoxidase Ab SerPl-aCnc Latest Ref Range: 0 - 34 IU/mL 161 (H)      Thyroglobulin Antibody Latest Ref Range: 0.0 - 0.9 IU/mL <1.0        Latest Reference Range & Units 10/09/20 08:14 11/18/20 00:00 02/05/21 09:04 04/13/21 09:17 07/16/21 16:53  TSH 0.450 - 4.500  uIU/mL  0.01 ! 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    Her previsit thyroid function tests are consistent with under-replacement.  She saw her PCP prior to me who advised her to increase her dose of Levothyroxine to 75 mcg po daily before breakfast (can take 1.5 of her current 50 mcg tabs until she depletes her current supply).  Will repeat thyroid function prior to next visit and adjust dose if needed.   - The correct intake of thyroid hormone (Levothyroxine, Synthroid), is on empty stomach first thing in the morning, with water, separated by at least 30 minutes from breakfast and other medications,  and separated by more than 4 hours from calcium, iron, multivitamins, acid reflux medications (PPIs).  - This medication is a life-long medication and will be needed to correct thyroid hormone imbalances for the rest of your life.  The dose may change from time to time, based on thyroid blood work.  - It is extremely important to be consistent taking this medication, near the same time each morning.  -AVOID TAKING PRODUCTS CONTAINING BIOTIN (commonly found in Hair, Skin, Nails vitamins) AS IT INTERFERES WITH THE VALIDITY OF THYROID FUNCTION BLOOD TESTS.    -Patient is advised to maintain close follow up with Candace Douglas, MD for primary care needs.     I spent 20 minutes dedicated to the care of this patient on the date of this encounter to include pre-visit review of records, face-to-face time with the patient, and post visit ordering of  testing.  Sharene Butters Filsinger  participated in the discussions, expressed understanding, and voiced agreement with the above plans.  All questions were answered to her satisfaction. she is encouraged to contact clinic should she have any questions or concerns prior to her return visit.    Follow up  plan: Return in about 2 months (around 10/18/2021) for Thyroid follow up, Previsit labs.   Rayetta Pigg, [facility] [address] [place], [place] [address] Phone: [phone] Fax: [phone]  08/20/2021, 8:57 AM

## 2021-08-20 NOTE — Patient Instructions (Signed)

## 2021-08-23 ENCOUNTER — Encounter (HOSPITAL_BASED_OUTPATIENT_CLINIC_OR_DEPARTMENT_OTHER): Payer: Medicaid Other | Admitting: Internal Medicine

## 2021-08-23 ENCOUNTER — Other Ambulatory Visit: Payer: Self-pay

## 2021-08-23 DIAGNOSIS — E11621 Type 2 diabetes mellitus with foot ulcer: Secondary | ICD-10-CM

## 2021-08-23 DIAGNOSIS — L97522 Non-pressure chronic ulcer of other part of left foot with fat layer exposed: Secondary | ICD-10-CM

## 2021-08-23 DIAGNOSIS — L932 Other local lupus erythematosus: Secondary | ICD-10-CM | POA: Diagnosis not present

## 2021-08-23 DIAGNOSIS — L97512 Non-pressure chronic ulcer of other part of right foot with fat layer exposed: Secondary | ICD-10-CM | POA: Diagnosis not present

## 2021-08-23 NOTE — Progress Notes (Addendum)
Patient ID: Candace Robinson, female    DOB: 11-24-1965, 56 y.o.   MRN: [id number]  Chief Complaint  Patient presents with   Pre-op Exam      ICD-10-CM   1. Multiple open wounds of left lower extremity, initial encounter  S81.802A        History of Present Illness: Candace Robinson is a 56 y.o.  female  with a history of chronic bilateral foot wounds.  She presents for preoperative evaluation for upcoming procedure, debridement of her foot and ankle wounds bilaterally with bilateral application of PriMatrix Ag, scheduled for 09/06/2021 with Dr. Claudia Desanctis.  The patient has not had problems with anesthesia.  She denies any personal or family history of blood clots or clotting disorder.  She has never had cancer before.  She currently resides with her sister.  Ambulates with an assistive walker due to pain from her lower extremity wounds for which she is followed at pain management clinic.  She takes tramadol 100 mg 3 times daily as needed.  Most recent hemoglobin obtained 07/06/2021 was 7.0, down from baseline of approximately 7.8-8.0.  According the chart, cause of her normocytic anemia is multifactorial.  Anemia is managed by her nephrologist, Dr. Royce Macadamia, who she sees 08/31/2021.  She will let her know about her upcoming surgery and inquire about whether she will require transfusion before then.  Patient takes aspirin 81 mg daily as prescribed by Dr. Candis Musa, cardiology at [facility].  Will reach out to that office for surgical clearance to hold ASA 5-7 days pre-op, particularly given that she is already on the lower end of her baseline hemoglobin.  She tells me that she does her dressing changes at home independently and feels comfortable doing that postoperatively, as well.  Summary of Previous Visit: Patient was seen here for consultation on 08/04/2021, referred from the wound center.  She complained of chronic wounds on her bilateral feet and ankles.  Treated with Unna boots without any considerable  improvement these past few years.  Patient does have lupus which may be contributing to her difficulty with wound healing.  Debridements at wound care center have been limited due to patient's discomfort which is why she was referred here for consideration of OR debridement.  Severe bilateral edema seen on exam.  Discussed risks and benefits of proceeding with OR debridement with application of wound matrix.  Patient expressed understanding and was agreeable to plan.  PMH Significant for: HTN, anemia, cutaneous lupus erythematosus, chronic skin ulcers of bilateral feet and ankles, thyroid disorder, previous atrial fibrillation which has since resolved after treatment of hypothyroidism.   Past Medical History: Allergies: Allergies  Allergen Reactions   Motrin [Ibuprofen] Other (See Comments)    hallucinations    Current Medications:  Current Outpatient Medications:    acetaminophen (TYLENOL) 325 MG tablet, Take 2 tablets (650 mg total) by mouth every 6 (six) hours. (Patient not taking: Reported on 08/20/2021), Disp: 30 tablet, Rfl: 0   acetaminophen (TYLENOL) 500 MG tablet, Take 1,000 mg by mouth every 6 (six) hours as needed for moderate pain., Disp: , Rfl:    amLODipine (NORVASC) 10 MG tablet, Take 10 mg by mouth daily., Disp: , Rfl:    ASPIRIN LOW DOSE 81 MG EC tablet, Take 81 mg by mouth daily., Disp: , Rfl:    atorvastatin (LIPITOR) 20 MG tablet, Take 20 mg by mouth daily., Disp: , Rfl:    COLISTIMETHATE SODIUM, CBA, IJ, 300 mg See admin instructions.  Add contents of 2 vials (300 mg) and 1 capsule in the mixing bottle 10 pumps of Bassa Gel; mix well and apply to affected area daily, Disp: , Rfl:    ferrous sulfate 325 (65 FE) MG tablet, Take 1 tablet (325 mg total) by mouth in the morning and at bedtime., Disp: 60 tablet, Rfl: 0   hydrOXYzine (ATARAX/VISTARIL) 25 MG tablet, Take 25 mg by mouth every 8 (eight) hours as needed for itching., Disp: , Rfl:    levothyroxine (SYNTHROID) 75 MCG  tablet, Take 75 mcg by mouth every morning., Disp: , Rfl:    lisinopril (ZESTRIL) 10 MG tablet, Take 10 mg by mouth daily., Disp: , Rfl:    Multiple Vitamins-Minerals (CENTRUM SILVER 50+WOMEN) TABS, Take 1 tablet by mouth daily., Disp: , Rfl:    NONFORMULARY OR COMPOUNDED ITEM, Apply 1 application topically daily. Compunded med for wound Bassa gel with Gentamycin, Disp: , Rfl:    omeprazole (PRILOSEC) 20 MG capsule, Take 20 mg by mouth 2 (two) times daily., Disp: , Rfl:    ondansetron (ZOFRAN) 4 MG tablet, Take 4 mg by mouth every 8 (eight) hours as needed for vomiting or nausea., Disp: , Rfl:    tacrolimus (PROTOPIC) 0.1 % ointment, Apply 1 application topically daily as needed (lupus skin irritation)., Disp: , Rfl:    traMADol HCl 100 MG TABS, Take 100 mg by mouth every 8 (eight) hours as needed (pain)., Disp: , Rfl:    zinc gluconate 50 MG tablet, Take 50 mg by mouth daily., Disp: , Rfl:   Past Medical Problems: Past Medical History:  Diagnosis Date   Anemia    Atrial fibrillation ([place])    in setting of hyperthyroidism   CHF (congestive heart failure) ([place])    Diabetes mellitus without complication ([place])    Heart murmur    mild AS 02/15/21 echo [facility])   Hypertension    Hyperthyroidism    throtoxicosis 08/2020; s/p RAI therapy 12/11/20 for Graves' Disease   Hypothyroidism    post-RAI therapy   Lupus ([place])    cutaneous discoid lupus    Past Surgical History: Past Surgical History:  Procedure Laterality Date   ABLATION Right    approximately 2015. right lower leg   TONSILLECTOMY     age 56    Social History: Social History   Socioeconomic History   Marital status: Single    Spouse name: Not on file   Number of children: Not on file   Years of education: Not on file   Highest education level: Not on file  Occupational History   Not on file  Tobacco Use   Smoking status: Never   Smokeless tobacco: Never  Vaping Use   Vaping Use: Never used  Substance and Sexual Activity    Alcohol use: Not Currently   Drug use: Not Currently   Sexual activity: Not on file  Other Topics Concern   Not on file  Social History Narrative   Not on file   Social Determinants of Health   Financial Resource Strain: Not on file  Food Insecurity: Not on file  Transportation Needs: Not on file  Physical Activity: Not on file  Stress: Not on file  Social Connections: Not on file  Intimate Partner Violence: Not on file    Family History: Family History  Problem Relation Age of Onset   Hypertension Mother    Diabetes Mother    COPD Mother    Glaucoma Mother    Cancer -  Colon Father    Diabetes Sister    Glaucoma Sister    Uveitis Daughter    Lupus Cousin        systemic   Lupus Cousin        systemic   Lupus Cousin        cutaneous    Review of Systems: ROS Denies recent fevers, chills, illness or infection, chest pain and difficulty breathing.  Physical Exam: Vital Signs BP 114/74 (BP Location: Left Arm, Patient Position: Sitting, Cuff Size: Large)    Pulse 94    Ht 5\' 3"  (1.6 m)    Wt 161 lb 1.6 oz (73.1 kg)    SpO2 100%    BMI 28.54 kg/m   Physical Exam Constitutional:      General: Not in acute distress.    Appearance: Normal appearance. Not ill-appearing.  HENT:     Head: Normocephalic and atraumatic.  Eyes:     Pupils: Pupils are equal, round. Cardiovascular:     Rate and Rhythm: Normal rate.    Pulses: Normal pulses.  Pulmonary:     Effort: No respiratory distress or increased work of breathing.  Speaks in full sentences. Abdominal:     General: Abdomen is flat. No distension.   Musculoskeletal: Normal range of motion.  Ace wraps and dressings on each leg.  Mild swelling appreciated, but no obvious varicosities. Skin:    General: Skin is warm and dry.     Findings: No erythema or rash.  Neurological:     Mental Status: Alert and oriented to person, place, and time.  Psychiatric:        Mood and Affect: Mood normal.        Behavior:  Behavior normal.    Assessment/Plan: The patient is scheduled for lower extremity debridement with application of PriMatrix Ag with Dr. Claudia Desanctis.  Risks, benefits, and alternatives of procedure discussed, questions answered and consent obtained.    Smoking Status: Non-smoker.  Caprini Score: 5; Risk Factors include: Age, BMI greater than 25, limited ambulation, leg swelling, and length of planned surgery. Recommendation for mechanical prophylaxis. Encourage early ambulation.   Pictures obtained: 03/10/2021.  She states that not much is changed since those photos were obtained.  Post-op Rx sent to pharmacy: Zofran.  Patient was provided with the General Surgical Risk consent document and Pain Medication Agreement prior to their appointment.  They had adequate time to read through the risk consent documents and Pain Medication Agreement. We also discussed them in person together during this preop appointment. All of their questions were answered to their satisfaction.  Recommended calling if they have any further questions.  Risk consent form and Pain Medication Agreement to be scanned into patient's chart.    Electronically signed by: Krista Blue, PA-C 08/26/2021 4:13 PM

## 2021-08-24 NOTE — Pre-Procedure Instructions (Signed)
Surgical Instructions    Your procedure is scheduled on Monday 09/06/21.   Report to [facility] Main Entrance "A" at 07:45 A.M., then check in with the Admitting office.  Call this number if you have problems the morning of surgery:  [phone]   If you have any questions prior to your surgery date call [phone]: Open Monday-Friday 8am-4pm    Remember:  Do not eat after midnight the night before your surgery  You may drink clear liquids until 06:45 A.M. the morning of your surgery.   Clear liquids allowed are: Water, Non-Citrus Juices (without pulp), Carbonated Beverages, Clear Tea, Black Coffee ONLY (NO MILK, CREAM OR POWDERED CREAMER of any kind), and Gatorade    Take these medicines the morning of surgery with A SIP OF WATER:   amLODipine (NORVASC)  atorvastatin (LIPITOR)   levothyroxine (SYNTHROID)  omeprazole (PRILOSEC)    acetaminophen (TYLENOL)- If needed  hydrOXYzine (ATARAX/VISTARIL)- If needed  ondansetron [facility])- If needed  traMADol- If needed   As of today, STOP taking any Aspirin (unless otherwise instructed by your surgeon) Aleve, Naproxen, Ibuprofen, Motrin, Advil, Goody's, BC's, all herbal medications, fish oil, and all vitamins.           Do not wear jewelry or makeup Do not wear lotions, powders, perfumes/colognes, or deodorant. Do not shave 48 hours prior to surgery.  Men may shave face and neck. Do not bring valuables to the hospital. Do not wear nail polish, gel polish, artificial nails, or any other type of covering on natural nails (fingers and toes) If you have artificial nails or gel coating that need to be removed by a nail salon, please have this removed prior to surgery. Artificial nails or gel coating may interfere with anesthesia's ability to adequately monitor your vital signs.  [facility] is not responsible for any belongings or valuables. .   Do NOT Smoke (Tobacco/Vaping)  24 hours prior to your procedure  If you use a CPAP at night,  you may bring your mask for your overnight stay.   Contacts, glasses, hearing aids, dentures or partials may not be worn into surgery, please bring cases for these belongings   For patients admitted to the hospital, discharge time will be determined by your treatment team.   Patients discharged the day of surgery will not be allowed to drive home, and someone needs to stay with them for 24 hours.  NO VISITORS WILL BE ALLOWED IN PRE-OP WHERE PATIENTS ARE PREPPED FOR SURGERY.  ONLY 1 SUPPORT PERSON MAY BE PRESENT IN THE WAITING ROOM WHILE YOU ARE IN SURGERY.  IF YOU ARE TO BE ADMITTED, ONCE YOU ARE IN YOUR ROOM YOU WILL BE ALLOWED TWO (2) VISITORS. 1 (ONE) VISITOR MAY STAY OVERNIGHT BUT MUST ARRIVE TO THE ROOM BY 8pm.  Minor children may have two parents present. Special consideration for safety and communication needs will be reviewed on a case by case basis.  Special instructions:    Oral Hygiene is also important to reduce your risk of infection.  Remember - BRUSH YOUR TEETH THE MORNING OF SURGERY WITH YOUR REGULAR TOOTHPASTE   [place]- Preparing For Surgery  Before surgery, you can play an important role. Because skin is not sterile, your skin needs to be as free of germs as possible. You can reduce the number of germs on your skin by washing with CHG (chlorahexidine gluconate) Soap before surgery.  CHG is an antiseptic cleaner which kills germs and bonds with the skin to continue  killing germs even after washing.     Please do not use if you have an allergy to CHG or antibacterial soaps. If your skin becomes reddened/irritated stop using the CHG.  Do not shave (including legs and underarms) for at least 48 hours prior to first CHG shower. It is OK to shave your face.  Please follow these instructions carefully.     Shower the NIGHT BEFORE SURGERY and the MORNING OF SURGERY with CHG Soap.   If you chose to wash your hair, wash your hair first as usual with your normal shampoo. After  you shampoo, rinse your hair and body thoroughly to remove the shampoo.  Then [employer] and genitals (private parts) with your normal soap and rinse thoroughly to remove soap.  After that Use CHG Soap as you would any other liquid soap. You can apply CHG directly to the skin and wash gently with a scrungie or a clean washcloth.   Apply the CHG Soap to your body ONLY FROM THE NECK DOWN.  Do not use on open wounds or open sores. Avoid contact with your eyes, ears, mouth and genitals (private parts). Wash Face and genitals (private parts)  with your normal soap.   Wash thoroughly, paying special attention to the area where your surgery will be performed.  Thoroughly rinse your body with warm water from the neck down.  DO NOT shower/wash with your normal soap after using and rinsing off the CHG Soap.  Pat yourself dry with a CLEAN TOWEL.  Wear CLEAN PAJAMAS to bed the night before surgery  Place CLEAN SHEETS on your bed the night before your surgery  DO NOT SLEEP WITH PETS.   Day of Surgery:  Take a shower with CHG soap. Wear Clean/Comfortable clothing the morning of surgery Do not apply any deodorants/lotions.   Remember to brush your teeth WITH YOUR REGULAR TOOTHPASTE.    COVID testing  If you are going to stay overnight or be admitted after your procedure/surgery and require a pre-op COVID test, please follow these instructions after your COVID test   You are not required to quarantine however you are required to wear a well-fitting mask when you are out and around people not in your household.  If your mask becomes wet or soiled, replace with a new one.  Wash your hands often with soap and water for 20 seconds or clean your hands with an alcohol-based hand sanitizer that contains at least 60% alcohol.  Do not share personal items.  Notify your provider: if you are in close contact with someone who has COVID  or if you develop a fever of 100.4 or greater, sneezing, cough,  sore throat, shortness of breath or body aches.    Please read over the following fact sheets that you were given.

## 2021-08-25 ENCOUNTER — Encounter (HOSPITAL_COMMUNITY): Payer: Self-pay

## 2021-08-25 ENCOUNTER — Other Ambulatory Visit: Payer: Self-pay

## 2021-08-25 ENCOUNTER — Encounter (HOSPITAL_COMMUNITY): Payer: Self-pay | Admitting: Vascular Surgery

## 2021-08-25 ENCOUNTER — Encounter (HOSPITAL_COMMUNITY)
Admission: RE | Admit: 2021-08-25 | Discharge: 2021-08-25 | Disposition: A | Discharge status: Home / Self Care | Payer: Medicaid Other | Source: Ambulatory Visit | Attending: Plastic Surgery | Admitting: Plastic Surgery

## 2021-08-25 VITALS — BP 114/73 | HR 85 | Temp 98.0°F | Resp 18 | Ht 63.0 in | Wt 161.8 lb

## 2021-08-25 DIAGNOSIS — S90922A Unspecified superficial injury of left foot, initial encounter: Secondary | ICD-10-CM | POA: Insufficient documentation

## 2021-08-25 DIAGNOSIS — I5032 Chronic diastolic (congestive) heart failure: Secondary | ICD-10-CM | POA: Diagnosis not present

## 2021-08-25 DIAGNOSIS — X58XXXA Exposure to other specified factors, initial encounter: Secondary | ICD-10-CM | POA: Insufficient documentation

## 2021-08-25 DIAGNOSIS — E0500 Thyrotoxicosis with diffuse goiter without thyrotoxic crisis or storm: Secondary | ICD-10-CM | POA: Diagnosis not present

## 2021-08-25 DIAGNOSIS — E119 Type 2 diabetes mellitus without complications: Secondary | ICD-10-CM

## 2021-08-25 DIAGNOSIS — I350 Nonrheumatic aortic (valve) stenosis: Secondary | ICD-10-CM | POA: Insufficient documentation

## 2021-08-25 DIAGNOSIS — R011 Cardiac murmur, unspecified: Secondary | ICD-10-CM | POA: Insufficient documentation

## 2021-08-25 DIAGNOSIS — S90921A Unspecified superficial injury of right foot, initial encounter: Secondary | ICD-10-CM | POA: Diagnosis not present

## 2021-08-25 DIAGNOSIS — Z01818 Encounter for other preprocedural examination: Secondary | ICD-10-CM | POA: Diagnosis present

## 2021-08-25 DIAGNOSIS — E039 Hypothyroidism, unspecified: Secondary | ICD-10-CM | POA: Diagnosis not present

## 2021-08-25 DIAGNOSIS — S90912A Unspecified superficial injury of left ankle, initial encounter: Secondary | ICD-10-CM | POA: Diagnosis not present

## 2021-08-25 DIAGNOSIS — I2510 Atherosclerotic heart disease of native coronary artery without angina pectoris: Secondary | ICD-10-CM

## 2021-08-25 DIAGNOSIS — S90911A Unspecified superficial injury of right ankle, initial encounter: Secondary | ICD-10-CM | POA: Insufficient documentation

## 2021-08-25 DIAGNOSIS — I110 Hypertensive heart disease with heart failure: Secondary | ICD-10-CM | POA: Insufficient documentation

## 2021-08-25 DIAGNOSIS — D649 Anemia, unspecified: Secondary | ICD-10-CM | POA: Diagnosis not present

## 2021-08-25 DIAGNOSIS — E785 Hyperlipidemia, unspecified: Secondary | ICD-10-CM | POA: Insufficient documentation

## 2021-08-25 DIAGNOSIS — I4891 Unspecified atrial fibrillation: Secondary | ICD-10-CM | POA: Diagnosis not present

## 2021-08-25 DIAGNOSIS — L930 Discoid lupus erythematosus: Secondary | ICD-10-CM | POA: Diagnosis not present

## 2021-08-25 DIAGNOSIS — Z7982 Long term (current) use of aspirin: Secondary | ICD-10-CM | POA: Insufficient documentation

## 2021-08-25 HISTORY — DX: Thyrotoxicosis, unspecified without thyrotoxic crisis or storm: E05.90

## 2021-08-25 HISTORY — DX: Hypothyroidism, unspecified: E03.9

## 2021-08-25 LAB — BASIC METABOLIC PANEL
Anion gap: 6 (ref 5–15)
BUN: 23 mg/dL — ABNORMAL HIGH (ref 6–20)
CO2: 24 mmol/L (ref 22–32)
Calcium: 9.3 mg/dL (ref 8.9–10.3)
Chloride: 100 mmol/L (ref 98–111)
Creatinine, Ser: 1.06 mg/dL — ABNORMAL HIGH (ref 0.44–1.00)
GFR, Estimated: >60 mL/min (ref >60)
Glucose, Bld: 93 mg/dL (ref 70–99)
Potassium: 4.9 mmol/L (ref 3.5–5.1)
Sodium: 130 mmol/L — ABNORMAL LOW (ref 135–145)

## 2021-08-25 LAB — CBC
HCT: 25.1 % — ABNORMAL LOW (ref 36.0–46.0)
Hemoglobin: 7.3 g/dL — ABNORMAL LOW (ref 12.0–15.0)
MCH: 25 pg — ABNORMAL LOW (ref 26.0–34.0)
MCHC: 29.1 g/dL — ABNORMAL LOW (ref 30.0–36.0)
MCV: 86 fL (ref 80.0–100.0)
Platelets: 279 10*3/uL (ref 150–400)
RBC: 2.92 MIL/uL — ABNORMAL LOW (ref 3.87–5.11)
RDW: 18.7 % — ABNORMAL HIGH (ref 11.5–15.5)
WBC: 3.8 10*3/uL — ABNORMAL LOW (ref 4.0–10.5)
nRBC: 0 % (ref 0.0–0.2)

## 2021-08-25 LAB — SURGICAL PCR SCREEN
MRSA, PCR: NEGATIVE
Staphylococcus aureus: NEGATIVE

## 2021-08-25 LAB — GLUCOSE, CAPILLARY: Glucose-Capillary: 80 mg/dL (ref 70–99)

## 2021-08-25 NOTE — Progress Notes (Signed)
Called Dr. Claudia Desanctis office to notify of today's abnormal CBC- hemoglobin 7.3, hematocrit 25.1,and BMP- sodium level 130 on today's labs. Per the representative she will make Dr. Claudia Desanctis aware and per the representative patient has a pre-op appointment tomorrow with Dr. Claudia Desanctis.

## 2021-08-25 NOTE — Progress Notes (Addendum)
Anesthesia Chart Review:  Case: [id number] Date/Time: 09/06/21 0930   Procedures:      DEBRIDEMENT FOOT AND ANKLE WOUNDS (Bilateral: Ankle)     APPLICATION OF PRIMATRIX AG (Bilateral: Ankle)   Anesthesia type: General   Pre-op diagnosis: WOUND OF LOWER EXTREMITIES   Location: [facility] OR ROOM 07 / [facility] OR   Surgeons: [name], MD       DISCUSSION: Patient is a 56 year old female scheduled for the above procedure.  History includes never smoker, HTN, DM2, discoid cutaneous lupus (recurrent skin ulcerations), hypothyroidism (thyrotoxicosis 08/2020, diagnosed with Graves' Disease, s/p RAI therapy 12/11/20), post-ablative hypothyroidism, atrial fibrillation (in setting of thyrotoxicosis 08/2020), murmur/aortic stenosis (mild 02/2021), HFpEF, anemia, right GSV ablation (2016).  Last cardiology evaluation with Dr. Candis Musa on 05/13/21. No recurrence of afib after hyperthyroidism treated. On low dose ASA without bleeding issues. Continue statin for HLD. Continue surveillance of mild AS. Consider reducing amlodipine if  BP is running low. Six month follow-up planned.    Preoperative labs showed H/H 7.3/25.1, previously 7.0/24.1. She was scheduled for PRBC on 08/05/21, but did not show. Reportedly, nephrologist Dr. Royce Macadamia is managing her anemia. Office is closed currently, but would like to nephrology to review results. Unclear what their threshold for transfusion--her HGB has been ~ 7.0-8.3 for the past year according to labs in [facility]. She is scheduled to see Plastic Surgery on 08/26/21.  ADDENDUM 08/26/21 11:03 AM: I called and spoke with Arville Go assistant to Dr. Royce Macadamia regarding 08/25/21 lab results and surgery plans. She was able to view them and will have Dr. Royce Macadamia review. Patient is actually scheduled to see Dr. Royce Macadamia on 08/31/21 for follow-up and if any new recommendations can address at that time. Updated anesthesiologist Suella Broad, MD.   VS: BP 114/73    Pulse 85    Temp 36.7 C (Oral)    Resp 18    Ht 5\' 3"   (1.6 m)    Wt 73.4 kg    SpO2 100%    BMI 28.66 kg/m    PROVIDERS: [name], MD Cathie Hoops, MD is cardiologist [facility]) Harrie Jeans, MD is nephrologist Bo Merino, MD is rheumatologist Rayetta Pigg, NP is endocrinology provider Servando Snare, MD is vascular surgeon Kalman Shan, DO is wound care provider   LABS: Preoperative labs noted. See DISCUSSION. (all labs ordered are listed, but only abnormal results are displayed)  Labs Reviewed  BASIC METABOLIC PANEL - Abnormal; Notable for the following components:      Result Value   Sodium 130 (*)    BUN 23 (*)    Creatinine, Ser 1.06 (*)    All other components within normal limits  CBC - Abnormal; Notable for the following components:   WBC 3.8 (*)    RBC 2.92 (*)    Hemoglobin 7.3 (*)    HCT 25.1 (*)    MCH 25.0 (*)    MCHC 29.1 (*)    RDW 18.7 (*)    All other components within normal limits  SURGICAL PCR SCREEN  GLUCOSE, CAPILLARY  HEMOGLOBIN A1C     IMAGES: US Renal 08/11/21: IMPRESSION: 1. No hydronephrosis. 2. Cyst measuring up to 2.8 cm within the midpole of the left kidney.    EKG: 08/25/21: Normal sinus rhythm Cannot rule out Anterior infarct , age undetermined Abnormal ECG When compared with ECG of 31-Aug-2020 10:41, Since last tracing rate slower Confirmed by Fransico Him ([id number]) on 08/25/2021 11:52:04 AM   CV: 8-15 day Cardiac event monitor 02/16/21 (  [facility]): Impression: Patient had a min HR of 36 bpm, max HR of 167 bpm, and avg HR of 84 bpm. Predominant underlying rhythm was Sinus Rhythm. 4 Supraventricular Tachycardia runs occurred, the run with the fastest interval lasting 5 beats with a max rate of 167 bpm (avg 143 bpm); the run with the fastest interval was also the longest. 3 episode(s) of AV Block (2nd) occurred, lasting a total of 11 secs. Isolated SVEs were rare (<1.0%), SVE Couplets were rare (<1.0%), and SVE Triplets were rare (<1.0%). Isolated VEs were rare (<1.0%),  and no VE Couplets or VE Triplets were present.  Final Interpretation  Agree with Findings. Patient was in sinus rhythm with rare ectopy. Patient triggered event corresponds to sinus rhythm. No sustained arrhythmia, high degree AV block or pathological pauses present. Electronically signed by Dr. Jodelle Gross Assar 02/15/21 05:39 PM   Echo 02/15/21 [facility]): Summary    1. The left ventricle is normal in size with upper normal wall thickness.    2. The left ventricular systolic function is normal, LVEF is visually  estimated at 60-65%.    3. There is grade I diastolic dysfunction (impaired relaxation).    4. There is mild aortic valve stenosis. Peak AV transvalvular velocity:  2.2 m/s.    Mean gradient: 10 mmHg.    5. The left atrium is mildly to moderately dilated in size.    6. The right ventricle is normal in size, with normal systolic function.    Treadmill Stress Echo 11/18/14: As outlined by [facility] cardiologist Dr. Prince Rome in 01/12/15 note, "She had a stress echocardiogram. Her echo images were negative. She did not have chest pain on the treadmill. She exercised for 7 metabolic equivalents. She did have mild upsloping ST depression in recovery."   Past Medical History:  Diagnosis Date   Anemia    Atrial fibrillation ([facility])    CHF (congestive heart failure) ([place])    Diabetes mellitus without complication ([place])    Heart murmur    Hypertension    Hyperthyroidism    Lupus ([place])    Thyroid disease    hyperthyroid    Past Surgical History:  Procedure Laterality Date   ABLATION Right    approximately 2015. right lower leg   TONSILLECTOMY     age 73    MEDICATIONS:  acetaminophen (TYLENOL) 325 MG tablet   acetaminophen (TYLENOL) 500 MG tablet   amLODipine (NORVASC) 10 MG tablet   ASPIRIN LOW DOSE 81 MG EC tablet   atorvastatin (LIPITOR) 20 MG tablet   COLISTIMETHATE SODIUM, CBA, IJ   ferrous sulfate 325 (65 FE) MG tablet   hydrOXYzine (ATARAX/VISTARIL) 25 MG tablet    levothyroxine (SYNTHROID) 75 MCG tablet   lisinopril (ZESTRIL) 10 MG tablet   Multiple Vitamins-Minerals (CENTRUM SILVER 50+WOMEN) TABS   NONFORMULARY OR COMPOUNDED ITEM   omeprazole (PRILOSEC) 20 MG capsule   ondansetron (ZOFRAN) 4 MG tablet   tacrolimus (PROTOPIC) 0.1 % ointment   traMADol HCl 100 MG TABS   zinc gluconate 50 MG tablet   No current facility-administered medications for this encounter.    Myra Gianotti, PA-C Surgical Short Stay/Anesthesiology [facility] Phone [phone] [facility] Phone [phone] 08/25/2021 6:14 PM

## 2021-08-25 NOTE — Progress Notes (Signed)
PCP - Dr. [name] Cardiologist - Dr. Cathie Hoops- Notes in Care everywhere Nephrologist- Dr. Harrie Jeans- [facility] Rheumatologist- Dr. Bo Merino   PPM/ICD - n/a Device Orders - n/a Rep Notified - n/a  Chest x-ray - 08/31/20 EKG - 08/25/21 Stress Test - denies ECHO - 09/01/20 Cardiac Cath - denies  Sleep Study - denies CPAP - n/a  Fasting Blood Sugar - 80 Checks Blood Sugar _____ times a day- Patient states she does not check her blood sugar.   Blood Thinner Instructions: n/a Aspirin Instructions: As of today stop taking Aspirin unless otherwise instructed by your surgeon. Patient states she has an appointment with Dr. Claudia Desanctis tomorrow and will confirm to stop taking Aspirin.   ERAS Protcol - Yes PRE-SURGERY Ensure or G2- No  COVID TEST- Ambulatory Surgery   Anesthesia review: Yes. Hemoglobin 7.0 on 07/06/21. Patient was supposed to have a blood transfusion on 08/05/21. She was referred to Dr. Harrie Jeans by Dr. Bo Merino for elevated creatinine and iron deficiency anemia. Patient states she missed the blood transfusion appointment because she was not aware. Patient states she was told they would recheck her blood work in a "few weeks" but has not heard from the office.   Patient denies shortness of breath, fever, cough and chest pain at PAT appointment   All instructions explained to the patient, with a verbal understanding of the material. Patient agrees to go over the instructions while at home for a better understanding. Patient also instructed to self quarantine after being tested for COVID-19. The opportunity to ask questions was provided.

## 2021-08-26 ENCOUNTER — Encounter: Payer: Self-pay | Admitting: Physician Assistant

## 2021-08-26 ENCOUNTER — Ambulatory Visit (INDEPENDENT_AMBULATORY_CARE_PROVIDER_SITE_OTHER): Payer: Medicaid Other | Admitting: Physician Assistant

## 2021-08-26 VITALS — BP 114/74 | HR 94 | Ht 63.0 in | Wt 161.1 lb

## 2021-08-26 DIAGNOSIS — S81802A Unspecified open wound, left lower leg, initial encounter: Secondary | ICD-10-CM

## 2021-08-26 LAB — HEMOGLOBIN A1C
Hgb A1c MFr Bld: 5.2 % (ref 4.8–5.6)
Mean Plasma Glucose: 103 mg/dL

## 2021-08-26 MED ORDER — ONDANSETRON 4 MG PO TBDP: 4.0000 mg | ORAL_TABLET | Freq: Three times a day (TID) | ORAL | 0 refills | Status: AC | PRN

## 2021-08-26 NOTE — Anesthesia Preprocedure Evaluation (Deleted)
Anesthesia Evaluation  Patient identified by MRN, date of birth, ID band Patient awake    Reviewed: Allergy & Precautions, NPO status , Patient's Chart, lab work & pertinent test results  Airway Mallampati: II       Dental  (+) Poor Dentition, Missing, Loose   Pulmonary neg pulmonary ROS   Pulmonary exam normal        Cardiovascular hypertension, Pt. on medications + Past MI and + Cardiac Stents  Normal cardiovascular exam     Neuro/Psych  PSYCHIATRIC DISORDERS  Depression    negative neurological ROS     GI/Hepatic   Endo/Other  diabetes, Type 2, Oral Hypoglycemic Agents    Renal/GU      Musculoskeletal   Abdominal  (+) + obese  Peds  Hematology   Anesthesia Other Findings    Ramus lesion is 80% stenosed.   Prox Cx lesion is 95% stenosed.   Mid LAD lesion is 20% stenosed.   Prox RCA lesion is 40% stenosed.   A drug-eluting stent was successfully placed using a STENT ONYX FRONTIER 3.5X15.   Post intervention, there is a 0% residual stenosis.   1.  Significant one-vessel coronary artery disease with 95% stenosis in the proximal left circumflex which is a likely culprit for non-ST elevation myocardial infarction.  In addition, there is an 80% ostial stenosis in the ramus intermedius. 2.  Left ventricular angiography was not performed.  EF was normal by echo.  Mildly elevated left ventricular end-diastolic pressure. 3.  Successful angioplasty and drug-eluting stent placement to the left circumflex. 4.  Difficult catheterization via the right radial artery due to significant tortuosity of the right subclavian and innominate arteries.  A long sheath was required to facilitate engaging the coronary arteries.   Recommendations: Dual antiplatelet therapy for at least 12 months. Aggressive treatment of risk factors. Recommend medical therapy for the stenosis in the ramus given ostial location.      Reproductive/Obstetrics                             Anesthesia Physical Anesthesia Plan  ASA: 3  Anesthesia Plan: General   Post-op Pain Management: Regional block*   Induction: Intravenous  PONV Risk Score and Plan: Ondansetron  Airway Management Planned: LMA  Additional Equipment: None  Intra-op Plan:   Post-operative Plan: Extubation in OR  Informed Consent: I have reviewed the patients History and Physical, chart, labs and discussed the procedure including the risks, benefits and alternatives for the proposed anesthesia with the patient or authorized representative who has indicated his/her understanding and acceptance.     Dental advisory given  Plan Discussed with: CRNA  Anesthesia Plan Comments: (PAT note written by Barabara Motz, PA-C.  )       Anesthesia Quick Evaluation  

## 2021-08-30 ENCOUNTER — Telehealth: Payer: Self-pay

## 2021-08-30 NOTE — Telephone Encounter (Signed)
Faxed Surgical Clearance form on 08/27/21 to confirm if patient is ok to hold ASA 5-7 days.

## 2021-09-01 ENCOUNTER — Telehealth: Payer: Self-pay

## 2021-09-01 NOTE — Telephone Encounter (Signed)
Candace Robinson [phone]) called to say she is scheduled to have surgery on Monday, 3/6 at 9:45am, and after she saw her dermatologist, he is not recommending her to have surgery.  She said he feels surgery would be detrimental to her wound condition.  She would like to cancel her surgery. ?

## 2021-09-02 ENCOUNTER — Telehealth: Payer: Self-pay | Admitting: Plastic Surgery

## 2021-09-02 NOTE — Progress Notes (Signed)
DONAE, KUEKER ([id number]) Visit Report for 08/23/2021 Chief Complaint Document Details Patient Name: Date of Service: Candace Robinson, [name] 08/23/2021 2:00 PM Medical Record Number: [id number] Patient Account Number: [id number] Date of Birth/Sex: Treating RN: April 17, 1966 (56 y.o. [name] Primary Care Provider: [name] Other Clinician: Referring Provider: Treating Provider/Extender: Darcey Nora in Treatment: 26 Information Obtained from: Patient Chief Complaint Bilateral feet wounds Electronic Signature(s) Signed: 08/23/2021 2:41:13 PM By: Kalman Shan DO Entered By: Kalman Shan on 08/23/2021 14:36:39 -------------------------------------------------------------------------------- HPI Details Patient Name: Date of Service: Candace Robinson, [name] 08/23/2021 2:00 PM Medical Record Number: [id number] Patient Account Number: [id number] Date of Birth/Sex: Treating RN: [birth year] (56 y.o. [name] Primary Care Provider: [name] Other Clinician: Referring Provider: Treating Provider/Extender: Darcey Nora in Treatment: 26 History of Present Illness HPI Description: Admission 8/22 [name] is a 56 year old female with a past medical history of diet-controlled type 2 diabetes, hypothyroidism, chronic diastolic heart failure, cutaneous lupus erythematosus and essential hypertension that presents to the clinic for a 21-month history of worsening bilateral feet ulcers. She states that wounds to her feet have been an ongoing issue for several years. She states that with wound care they improve however they tend to wax and wane. 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 She was discharged to a nursing facility and she was provided wound care for her bilateral feet ulcers at that time. She states they used Silvadene and silver alginate. She states that  when she was discharged in April from the nursing facility her wounds had improved but were not fully closed. She currently uses Silvadene but has not been using silver alginate for several months due to running out. She reports significant decline in her wound healing over the last 4 months. She reports pain to the wound sites. She currently denies increased warmth erythema or purulent drainage. 8/29; patient presents for 1 week follow-up. She states she had ABIs completed without TBI's. She continues to use silver alginate with dressing changes. She reports continued pain. She denies signs of infection. 9/8; patient presents for 1 week follow-up. She was evaluated by Dr. Donzetta Matters and she reports follow-up with him in 1 month. She has been using Santyl to the wound beds. She reports some improvement in wound healing. She denies signs of infection. 9/15; patient presents for follow-up. She continues to use Santyl and silver alginate to the wound beds. She states the alginate is sticking and hard to remove with dressing changes. She currently denies signs of infection. 9/22; patient presents for 1 week follow-up. She is using Santyl and silver alginate to the wound beds. She ran out of [place] and has been using Silvadene. She states that gentamicin ointment is ready at her pharmacy and will be picking this up today. She has no issues or complaints today. She denies signs of infection. 9/30; she is using Santyl and silver alginate. She has substantial wounds on her bilateral feet and bilateral ankles that have been there for a year. She is in a lot of pain. She cannot bear to have these wounds touched let alone a mechanical debridement here. Furthermore the etiology of this is really not totally clear. She had reasonably normal ABIs with triphasic waveforms in August [I did not review this however]. She is completing a course of ciprofloxacin for an area on the left lateral ankle. She has had prior  ablations and follows with vascular surgery. Finally she apparently has lupus 10/6; patient has been using Silvadene to the wound beds with dressing changes. She states she is picking up Santyl today. She has no issues or complaints today. She denies signs of infection. 10/17 the patient managed to get Santyl which the she is using to all wound beds. There is a large areas of breakdown here including substantially on the left distal foot, right foot involving the dorsal toes both medial ankles. All of these wounds are covered with some degree of very adherent fibrinous material. The patient did has an appointment with vascular surgery Dr. Donzetta Matters in 2 days time 10/24; patient presents for follow-up. She has been using Santyl and silver alginate to the wound beds. She states that she is going to be set up with rheumatology soon. She denies signs of infection. She overall reports stability in her wound healing. 11/7; patient presents for follow-up. She continues to use Santyl and silver alginate to the wound beds. She has an appointment set to see rheumatology on 07/06/2021. She reports some improvement in her wound healing. She currently denies signs of infection. 11/14; patient presents for follow-up. She has been using Santyl and silver alginate to the wound beds. She has no issues or complaints today. She denies signs of infection. 11/28; patient presents for follow-up. She has been using silver alginate and Santyl to the wound beds. She did not pick up Dakin's solution. She felt a burning sensation to her feet when it was applied in office. She states she picked up ciprofloxacin 1 week ago. And she is currently taking this. She currently denies signs of infection. 12/12; patient presents for follow-up. States she has been using Silvadene to the wound beds. She has chronic pain. She currently denies signs of infection. 1/9; patient presents for follow-up. She uses Silvadene to the wound beds and changes  this up with Dakin's wet-to-dry dressings. She has chronic pain. She states she would like to consider OR debridement at this time. She currently denies systemic signs of infection. 1/23; patient presents for follow-up. She received her Keystone antibiotic and started this yesterday. She has no acute issues. She reports chronic pain to the wound sites. She is scheduled to see Dr. Claudia Desanctis on 2/1 for discussion of OR debridement. 2/6; patient presents for follow-up. She continues to use [facility] antibiotic. She saw Dr. Claudia Desanctis on 2/1 for discussion of OR debridement. Plan is to do this in 1 month. She currently denies systemic signs of infection. 2/20; patient presents for follow-up. She is using Keystone antibiotics. She is scheduled for wound debridement in the OR with Dr. Claudia Desanctis on 3/6. She currently denies signs of infection. Electronic Signature(s) Signed: 08/23/2021 2:41:13 PM By: Kalman Shan DO Entered By: Kalman Shan on 08/23/2021 14:37:36 -------------------------------------------------------------------------------- Physical Exam Details Patient Name: Date of Service: [name], [name] 08/23/2021 2:00 PM Medical Record Number: [id number] Patient Account Number: [id number] Date of Birth/Sex: Treating RN: 1966-03-31 (56 y.o. [name] Primary Care Provider: [name] Other Clinician: Referring Provider: Treating Provider/Extender: [name] Weeks in Treatment: 26 Constitutional respirations regular, non-labored and within target range for patient.Marland Kitchen Psychiatric pleasant and cooperative. Notes Wounds throughout the feet bilaterally with granulation tissue and nonviable tissue. No obvious signs of surrounding soft tissue infection. Electronic Signature(s) Signed: 08/23/2021 2:41:13 PM By: Kalman Shan DO Entered By: Kalman Shan on 08/23/2021  14:38:12 -------------------------------------------------------------------------------- Physician Orders Details Patient Name: Date of Service: BRO A  DNA X, [name] 08/23/2021 2:00 PM Medical Record Number: [id number] Patient Account Number: [id number] Date of Birth/Sex: Treating RN: [birth year] (56 y.o. [name], [name] Primary Care Provider: [name] Other Clinician: Referring Provider: Treating Provider/Extender: Darcey Nora in Treatment: 78 Verbal / Phone Orders: No Diagnosis Coding Follow-up Appointments ppointment in 2 weeks. - with Dr. Heber Lea w/ [facility] Room # 7 *****EXTRA TIME - 3 MINUTES***** Return A Bathing/ Shower/ Hygiene May shower and wash wound with soap and water. - when dressings changed Edema Control - Lymphedema / SCD / Other Elevate legs to the level of the heart or above for 30 minutes daily and/or when sitting, a frequency of: - throughout the day Avoid standing for long periods of time. Moisturize legs daily. Additional Orders / Instructions Follow Nutritious Diet - 100-120g of protein daily Wound Treatment Wound #1 - Ankle Wound Laterality: Right, Medial Cleanser: Soap and Water 1 x Per Day/30 Days Discharge Instructions: May shower and wash wound with dial antibacterial soap and water prior to dressing change. Cleanser: Wound Cleanser 1 x Per Day/30 Days Discharge Instructions: Cleanse the wound with wound cleanser prior to applying a clean dressing using gauze sponges, not tissue or cotton balls. Prim Dressing: Adaptic T ary ouch Silicone Non-Adherent Dressing, 3x4(in/in) (DME) (Generic) 1 x Per Day/30 Days Prim Dressing: keystone antibiotic compound 1 x Per Day/30 Days ary Discharge Instructions: thin layer to wound bed Secondary Dressing: ABD Pad, 8x10 (Generic) 1 x Per Day/30 Days Discharge Instructions: Apply over primary dressing as directed. Secondary Dressing: T Non-Adherent Dressing, 3x4 in (DME)  (Generic) 1 x Per Day/30 Days elfa Discharge Instructions: Apply over primary dressing as directed. Secondary Dressing: NonWoven Sponge, 4x4 (in/in) (Generic) 1 x Per Day/30 Days Discharge Instructions: Apply over primary dressing as directed Secured With: Kerlix Roll Sterile, 4.5x3.1 (in/yd) 1 x Per Day/30 Days Discharge Instructions: Secure with Kerlix as directed. Secured With: Transpore Surgical Tape, 2x10 (in/yd) 1 x Per Day/30 Days Discharge Instructions: Secure dressing with tape as directed. Wound #2 - Foot Wound Laterality: Dorsal, Right Cleanser: Soap and Water 1 x Per Day/30 Days Discharge Instructions: May shower and wash wound with dial antibacterial soap and water prior to dressing change. Cleanser: Wound Cleanser 1 x Per Day/30 Days Discharge Instructions: Cleanse the wound with wound cleanser prior to applying a clean dressing using gauze sponges, not tissue or cotton balls. Prim Dressing: Adaptic T ary ouch Silicone Non-Adherent Dressing, 3x4(in/in) (DME) (Generic) 1 x Per Day/30 Days Prim Dressing: keystone antibiotic compound 1 x Per Day/30 Days ary Discharge Instructions: thin layer to wound bed Secondary Dressing: ABD Pad, 8x10 (Generic) 1 x Per Day/30 Days Discharge Instructions: Apply over primary dressing as directed. Secondary Dressing: T Non-Adherent Dressing, 3x4 in (DME) (Generic) 1 x Per Day/30 Days elfa Discharge Instructions: Apply over primary dressing as directed. Secondary Dressing: NonWoven Sponge, 4x4 (in/in) (Generic) 1 x Per Day/30 Days Discharge Instructions: Apply over primary dressing as directed Secured With: Kerlix Roll Sterile, 4.5x3.1 (in/yd) 1 x Per Day/30 Days Discharge Instructions: Secure with Kerlix as directed. Secured With: Transpore Surgical Tape, 2x10 (in/yd) 1 x Per Day/30 Days Discharge Instructions: Secure dressing with tape as directed. Wound #3 - Ankle Wound Laterality: Left, Medial Cleanser: Soap and Water 1 x Per Day/30  Days Discharge Instructions: May shower and wash wound with dial antibacterial soap and water prior to dressing change. Cleanser: Wound Cleanser 1 x Per Day/30 Days Discharge Instructions: Cleanse the wound with wound cleanser  prior to applying a clean dressing using gauze sponges, not tissue or cotton balls. Prim Dressing: Adaptic T ary ouch Silicone Non-Adherent Dressing, 3x4(in/in) (DME) (Generic) 1 x Per Day/30 Days Prim Dressing: keystone antibiotic compound 1 x Per Day/30 Days ary Discharge Instructions: thin layer to wound bed Secondary Dressing: ABD Pad, 8x10 (Generic) 1 x Per Day/30 Days Discharge Instructions: Apply over primary dressing as directed. Secondary Dressing: T Non-Adherent Dressing, 3x4 in (DME) (Generic) 1 x Per Day/30 Days elfa Discharge Instructions: Apply over primary dressing as directed. Secondary Dressing: NonWoven Sponge, 4x4 (in/in) (Generic) 1 x Per Day/30 Days Discharge Instructions: Apply over primary dressing as directed Secured With: Kerlix Roll Sterile, 4.5x3.1 (in/yd) 1 x Per Day/30 Days Discharge Instructions: Secure with Kerlix as directed. Secured With: Transpore Surgical Tape, 2x10 (in/yd) 1 x Per Day/30 Days Discharge Instructions: Secure dressing with tape as directed. Wound #4 - Ankle Wound Laterality: Left, Lateral Cleanser: Soap and Water 1 x Per Day/30 Days Discharge Instructions: May shower and wash wound with dial antibacterial soap and water prior to dressing change. Cleanser: Wound Cleanser 1 x Per Day/30 Days Discharge Instructions: Cleanse the wound with wound cleanser prior to applying a clean dressing using gauze sponges, not tissue or cotton balls. Prim Dressing: Adaptic T ary ouch Silicone Non-Adherent Dressing, 3x4(in/in) (DME) (Generic) 1 x Per Day/30 Days Prim Dressing: keystone antibiotic compound 1 x Per Day/30 Days ary Discharge Instructions: thin layer to wound bed Secondary Dressing: ABD Pad, 8x10 (Generic) 1 x Per Day/30  Days Discharge Instructions: Apply over primary dressing as directed. Secondary Dressing: T Non-Adherent Dressing, 3x4 in (DME) (Generic) 1 x Per Day/30 Days elfa Discharge Instructions: Apply over primary dressing as directed. Secondary Dressing: NonWoven Sponge, 4x4 (in/in) (Generic) 1 x Per Day/30 Days Discharge Instructions: Apply over primary dressing as directed Secured With: Kerlix Roll Sterile, 4.5x3.1 (in/yd) 1 x Per Day/30 Days Discharge Instructions: Secure with Kerlix as directed. Secured With: Transpore Surgical Tape, 2x10 (in/yd) 1 x Per Day/30 Days Discharge Instructions: Secure dressing with tape as directed. Wound #5 - Foot Wound Laterality: Dorsal, Left Cleanser: Soap and Water 1 x Per Day/30 Days Discharge Instructions: May shower and wash wound with dial antibacterial soap and water prior to dressing change. Cleanser: Wound Cleanser 1 x Per Day/30 Days Discharge Instructions: Cleanse the wound with wound cleanser prior to applying a clean dressing using gauze sponges, not tissue or cotton balls. Prim Dressing: Adaptic T ary ouch Silicone Non-Adherent Dressing, 3x4(in/in) (DME) (Generic) 1 x Per Day/30 Days Prim Dressing: keystone antibiotic compound 1 x Per Day/30 Days ary Discharge Instructions: thin layer to wound bed Secondary Dressing: ABD Pad, 8x10 (Generic) 1 x Per Day/30 Days Discharge Instructions: Apply over primary dressing as directed. Secondary Dressing: T Non-Adherent Dressing, 3x4 in (DME) (Generic) 1 x Per Day/30 Days elfa Discharge Instructions: Apply over primary dressing as directed. Secondary Dressing: NonWoven Sponge, 4x4 (in/in) (Generic) 1 x Per Day/30 Days Discharge Instructions: Apply over primary dressing as directed Secured With: Kerlix Roll Sterile, 4.5x3.1 (in/yd) 1 x Per Day/30 Days Discharge Instructions: Secure with Kerlix as directed. Secured With: Transpore Surgical Tape, 2x10 (in/yd) 1 x Per Day/30 Days Discharge Instructions:  Secure dressing with tape as directed. Wound #6 - Foot Wound Laterality: Left, Medial, Distal Cleanser: Soap and Water 1 x Per Day/30 Days Discharge Instructions: May shower and wash wound with dial antibacterial soap and water prior to dressing change. Cleanser: Wound Cleanser 1 x Per Day/30 Days Discharge Instructions: Cleanse the wound  with wound cleanser prior to applying a clean dressing using gauze sponges, not tissue or cotton balls. Prim Dressing: Adaptic T ary ouch Silicone Non-Adherent Dressing, 3x4(in/in) (DME) (Generic) 1 x Per Day/30 Days Prim Dressing: keystone antibiotic compound 1 x Per Day/30 Days ary Discharge Instructions: thin layer to wound bed Secondary Dressing: ABD Pad, 8x10 (Generic) 1 x Per Day/30 Days Discharge Instructions: Apply over primary dressing as directed. Secondary Dressing: T Non-Adherent Dressing, 3x4 in (DME) (Generic) 1 x Per Day/30 Days elfa Discharge Instructions: Apply over primary dressing as directed. Secondary Dressing: NonWoven Sponge, 4x4 (in/in) (Generic) 1 x Per Day/30 Days Discharge Instructions: Apply over primary dressing as directed Secured With: Kerlix Roll Sterile, 4.5x3.1 (in/yd) 1 x Per Day/30 Days Discharge Instructions: Secure with Kerlix as directed. Secured With: Transpore Surgical Tape, 2x10 (in/yd) 1 x Per Day/30 Days Discharge Instructions: Secure dressing with tape as directed. Wound #7 - Ankle Wound Laterality: Right, Lateral Cleanser: Soap and Water 1 x Per Day/30 Days Discharge Instructions: May shower and wash wound with dial antibacterial soap and water prior to dressing change. Cleanser: Wound Cleanser 1 x Per Day/30 Days Discharge Instructions: Cleanse the wound with wound cleanser prior to applying a clean dressing using gauze sponges, not tissue or cotton balls. Prim Dressing: Adaptic T ary ouch Silicone Non-Adherent Dressing, 3x4(in/in) (DME) (Generic) 1 x Per Day/30 Days Prim Dressing: keystone antibiotic  compound 1 x Per Day/30 Days ary Discharge Instructions: thin layer to wound bed Secondary Dressing: ABD Pad, 8x10 (Generic) 1 x Per Day/30 Days Discharge Instructions: Apply over primary dressing as directed. Secondary Dressing: T Non-Adherent Dressing, 3x4 in (DME) (Generic) 1 x Per Day/30 Days elfa Discharge Instructions: Apply over primary dressing as directed. Secondary Dressing: NonWoven Sponge, 4x4 (in/in) (Generic) 1 x Per Day/30 Days Discharge Instructions: Apply over primary dressing as directed Secured With: Kerlix Roll Sterile, 4.5x3.1 (in/yd) 1 x Per Day/30 Days Discharge Instructions: Secure with Kerlix as directed. Secured With: Transpore Surgical Tape, 2x10 (in/yd) 1 x Per Day/30 Days Discharge Instructions: Secure dressing with tape as directed. Electronic Signature(s) Signed: 08/23/2021 2:41:13 PM By: Kalman Shan DO Entered By: Kalman Shan on 08/23/2021 14:38:40 -------------------------------------------------------------------------------- Problem List Details Patient Name: Date of Service: [name], [name] 08/23/2021 2:00 PM Medical Record Number: [id number] Patient Account Number: [id number] Date of Birth/Sex: Treating RN: June 08, 1966 (56 y.o. [name] Primary Care Provider: Other Clinician: [name] Referring Provider: Treating Provider/Extender: Darcey Nora in Treatment: 26 Active Problems ICD-10 Encounter Code Description Active Date MDM Diagnosis L97.512 Non-pressure chronic ulcer of other part of right foot with fat layer exposed 02/22/2021 No Yes L97.522 Non-pressure chronic ulcer of other part of left foot with fat layer exposed 02/22/2021 No Yes L93.2 Other local lupus erythematosus 02/22/2021 No Yes U72.53 Chronic diastolic (congestive) heart failure 02/22/2021 No Yes I10 Essential (primary) hypertension 02/22/2021 No Yes E11.621 Type 2 diabetes mellitus with foot ulcer 02/22/2021 No  Yes Inactive Problems Resolved Problems Electronic Signature(s) Signed: 08/23/2021 2:41:13 PM By: Kalman Shan DO Entered By: Kalman Shan on 08/23/2021 14:36:18 -------------------------------------------------------------------------------- Progress Note Details Patient Name: Date of Service: [name], [name] 08/23/2021 2:00 PM Medical Record Number: [id number] Patient Account Number: [id number] Date of Birth/Sex: Treating RN: 06/03/1966 (56 y.o. [name] Primary Care Provider: [name] Other Clinician: Referring Provider: Treating Provider/Extender: Darcey Nora in Treatment: 26 Subjective Chief Complaint Information obtained from Patient Bilateral feet wounds History of  Present Illness (HPI) Admission 8/22 [name] is a 56 year old female with a past medical history of diet-controlled type 2 diabetes, hypothyroidism, chronic diastolic heart failure, cutaneous lupus erythematosus and essential hypertension that presents to the clinic for a 32-month history of worsening bilateral feet ulcers. She states that wounds to her feet have been an ongoing issue for several years. She states that with wound care they improve however they tend to wax and wane. 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 She was discharged to a nursing facility and she was provided wound care for her bilateral feet ulcers at that time. She states they used Silvadene and silver alginate. She states that when she was discharged in April from the nursing facility her wounds had improved but were not fully closed. She currently uses Silvadene but has not been using silver alginate for several months due to running out. She reports significant decline in her wound healing over the last 4 months. She reports pain to the wound sites. She currently denies increased warmth erythema or purulent drainage. 8/29; patient  presents for 1 week follow-up. She states she had ABIs completed without TBI's. She continues to use silver alginate with dressing changes. She reports continued pain. She denies signs of infection. 9/8; patient presents for 1 week follow-up. She was evaluated by Dr. Donzetta Matters and she reports follow-up with him in 1 month. She has been using Santyl to the wound beds. She reports some improvement in wound healing. She denies signs of infection. 9/15; patient presents for follow-up. She continues to use Santyl and silver alginate to the wound beds. She states the alginate is sticking and hard to remove with dressing changes. She currently denies signs of infection. 9/22; patient presents for 1 week follow-up. She is using Santyl and silver alginate to the wound beds. She ran out of [place] and has been using Silvadene. She states that gentamicin ointment is ready at her pharmacy and will be picking this up today. She has no issues or complaints today. She denies signs of infection. 9/30; she is using Santyl and silver alginate. She has substantial wounds on her bilateral feet and bilateral ankles that have been there for a year. She is in a lot of pain. She cannot bear to have these wounds touched let alone a mechanical debridement here. Furthermore the etiology of this is really not totally clear. She had reasonably normal ABIs with triphasic waveforms in August [I did not review this however]. She is completing a course of ciprofloxacin for an area on the left lateral ankle. She has had prior ablations and follows with vascular surgery. Finally she apparently has lupus 10/6; patient has been using Silvadene to the wound beds with dressing changes. She states she is picking up Santyl today. She has no issues or complaints today. She denies signs of infection. 10/17 the patient managed to get Santyl which the she is using to all wound beds. There is a large areas of breakdown here including substantially on  the left distal foot, right foot involving the dorsal toes both medial ankles. All of these wounds are covered with some degree of very adherent fibrinous material. The patient did has an appointment with vascular surgery Dr. Donzetta Matters in 2 days time 10/24; patient presents for follow-up. She has been using Santyl and silver alginate to the wound beds. She states that she is going to be set up with rheumatology soon. She denies signs of infection. She overall reports  stability in her wound healing. 11/7; patient presents for follow-up. She continues to use Santyl and silver alginate to the wound beds. She has an appointment set to see rheumatology on 07/06/2021. She reports some improvement in her wound healing. She currently denies signs of infection. 11/14; patient presents for follow-up. She has been using Santyl and silver alginate to the wound beds. She has no issues or complaints today. She denies signs of infection. 11/28; patient presents for follow-up. She has been using silver alginate and Santyl to the wound beds. She did not pick up Dakin's solution. She felt a burning sensation to her feet when it was applied in office. She states she picked up ciprofloxacin 1 week ago. And she is currently taking this. She currently denies signs of infection. 12/12; patient presents for follow-up. States she has been using Silvadene to the wound beds. She has chronic pain. She currently denies signs of infection. 1/9; patient presents for follow-up. She uses Silvadene to the wound beds and changes this up with Dakin's wet-to-dry dressings. She has chronic pain. She states she would like to consider OR debridement at this time. She currently denies systemic signs of infection. 1/23; patient presents for follow-up. She received her Keystone antibiotic and started this yesterday. She has no acute issues. She reports chronic pain to the wound sites. She is scheduled to see Dr. Claudia Desanctis on 2/1 for discussion of OR  debridement. 2/6; patient presents for follow-up. She continues to use [facility] antibiotic. She saw Dr. Claudia Desanctis on 2/1 for discussion of OR debridement. Plan is to do this in 1 month. She currently denies systemic signs of infection. 2/20; patient presents for follow-up. She is using Keystone antibiotics. She is scheduled for wound debridement in the OR with Dr. Claudia Desanctis on 3/6. She currently denies signs of infection. Patient History Information obtained from Patient. Family History Unknown History. Social History Never smoker, Marital Status - Single, Alcohol Use - Never, Drug Use - No History, Caffeine Use - Rarely. Medical History Hematologic/Lymphatic Patient has history of Anemia Cardiovascular Patient has history of Arrhythmia - A-Fib, Congestive Heart Failure, Hypertension Denies history of Angina Endocrine Patient has history of Type II Diabetes Immunological Patient has history of Lupus Erythematosus Medical A Surgical History Notes nd Endocrine Grave's disease Objective Constitutional respirations regular, non-labored and within target range for patient.. Vitals Time Taken: 1:58 PM, Height: 63 in, Weight: 157 lbs, BMI: 27.8, Temperature: 100.2 F, Pulse: 106 bpm, Respiratory Rate: 17 breaths/min, Blood Pressure: 122/76 mmHg. Psychiatric pleasant and cooperative. General Notes: Wounds throughout the feet bilaterally with granulation tissue and nonviable tissue. No obvious signs of surrounding soft tissue infection. Integumentary (Hair, Skin) Wound #1 status is Open. Original cause of wound was Gradually Appeared. The date acquired was: 04/03/2020. The wound has been in treatment 26 weeks. The wound is located on the Right,Medial Ankle. The wound measures 7.4cm length x 6.8cm width x 0.4cm depth; 39.521cm^2 area and 15.808cm^3 volume. There is Fat Layer (Subcutaneous Tissue) exposed. There is no tunneling or undermining noted. There is a medium amount of serosanguineous drainage  noted. Foul odor after cleansing was noted. The wound margin is distinct with the outline attached to the wound base. There is small (1-33%) red, pink granulation within the wound bed. There is a large (67-100%) amount of necrotic tissue within the wound bed including Adherent Slough. Wound #2 status is Open. Original cause of wound was Gradually Appeared. The date acquired was: 04/03/2020. The wound has been in treatment 26 weeks.  The wound is located on the Right,Dorsal Foot. The wound measures 6cm length x 5.5cm width x 0.2cm depth; 25.918cm^2 area and 5.184cm^3 volume. There is Fat Layer (Subcutaneous Tissue) exposed. There is no tunneling or undermining noted. There is a medium amount of serosanguineous drainage noted. The wound margin is distinct with the outline attached to the wound base. There is small (1-33%) pink granulation within the wound bed. There is a large (67-100%) amount of necrotic tissue within the wound bed including Eschar and Adherent Slough. Wound #3 status is Open. Original cause of wound was Gradually Appeared. The date acquired was: 02/22/2021. The wound has been in treatment 26 weeks. The wound is located on the Left,Medial Ankle. The wound measures 1.2cm length x 2.3cm width x 0.2cm depth; 2.168cm^2 area and 0.434cm^3 volume. There is Fat Layer (Subcutaneous Tissue) exposed. There is no tunneling or undermining noted. There is a medium amount of serosanguineous drainage noted. The wound margin is distinct with the outline attached to the wound base. There is small (1-33%) pink granulation within the wound bed. There is a large (67-100%) amount of necrotic tissue within the wound bed including Adherent Slough. Wound #4 status is Open. Original cause of wound was Gradually Appeared. The date acquired was: 04/03/2020. The wound has been in treatment 26 weeks. The wound is located on the Left,Lateral Ankle. The wound measures 8.5cm length x 5.5cm width x 0.4cm depth; 36.717cm^2  area and 14.687cm^3 volume. There is Fat Layer (Subcutaneous Tissue) exposed. There is no tunneling or undermining noted. There is a medium amount of serosanguineous drainage noted. The wound margin is distinct with the outline attached to the wound base. There is medium (34-66%) red, pink granulation within the wound bed. There is a medium (34-66%) amount of necrotic tissue within the wound bed including Adherent Slough. Wound #5 status is Open. Original cause of wound was Gradually Appeared. The date acquired was: 04/03/2020. The wound has been in treatment 26 weeks. The wound is located on the Left,Dorsal Foot. The wound measures 9.8cm length x 9.7cm width x 0.3cm depth; 74.66cm^2 area and 22.398cm^3 volume. There is Fat Layer (Subcutaneous Tissue) exposed. There is no tunneling or undermining noted. There is a medium amount of serosanguineous drainage noted. The wound margin is distinct with the outline attached to the wound base. There is small (1-33%) granulation within the wound bed. There is a large (67-100%) amount of necrotic tissue within the wound bed including Adherent Slough. Wound #6 status is Open. Original cause of wound was Gradually Appeared. The date acquired was: 04/03/2020. The wound has been in treatment 26 weeks. The wound is located on the Left,Distal,Medial Foot. The wound measures 0.9cm length x 1.4cm width x 0.2cm depth; 0.99cm^2 area and 0.198cm^3 volume. There is Fat Layer (Subcutaneous Tissue) exposed. There is no tunneling or undermining noted. There is a small amount of serosanguineous drainage noted. The wound margin is distinct with the outline attached to the wound base. There is small (1-33%) granulation within the wound bed. There is a large (67-100%) amount of necrotic tissue within the wound bed including Adherent Slough. Wound #7 status is Open. Original cause of wound was Gradually Appeared. The date acquired was: 04/08/2021. The wound has been in treatment 19  weeks. The wound is located on the Right,Lateral Ankle. The wound measures 1.6cm length x 1.6cm width x 0.2cm depth; 2.011cm^2 area and 0.402cm^3 volume. There is Fat Layer (Subcutaneous Tissue) exposed. There is no tunneling or undermining noted. There is a medium  amount of serosanguineous drainage noted. The wound margin is distinct with the outline attached to the wound base. There is small (1-33%) pink granulation within the wound bed. There is a large (67- 100%) amount of necrotic tissue within the wound bed including Adherent Slough. 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 No signs of infection. I recommended continuing Keystone antibiotic and she can add Adaptic to this to help with alleviating the gauze from sticking with dressing changes. She is scheduled for OR debridement of her bilateral feet wounds on 09/06/2021 with Dr. Claudia Desanctis. She knows to call with any questions or concerns and can be seen prior to her surgery. Plan Follow-up Appointments: Return Appointment in 2 weeks. - with Dr. Heber Elmwood w/ [facility] Room # 7 *****EXTRA TIME - 13 MINUTES***** Bathing/ Shower/ Hygiene: May shower and wash wound with soap and water. - when dressings changed Edema Control - Lymphedema / SCD / Other: Elevate legs to the level of the heart or above for 30 minutes daily and/or when sitting, a frequency of: - throughout the day Avoid standing for long periods of time. Moisturize legs daily. Additional Orders / Instructions: Follow Nutritious Diet - 100-120g of protein daily WOUND #1: - Ankle Wound Laterality: Right, Medial Cleanser: Soap and Water 1 x Per Day/30 Days Discharge  Instructions: May shower and wash wound with dial antibacterial soap and water prior to dressing change. Cleanser: Wound Cleanser 1 x Per Day/30 Days Discharge Instructions: Cleanse the wound with wound cleanser prior to applying a clean dressing using gauze sponges, not tissue or cotton balls. Prim Dressing: Adaptic T ary ouch Silicone Non-Adherent Dressing, 3x4(in/in) (DME) (Generic) 1 x Per Day/30 Days Prim Dressing: keystone antibiotic compound 1 x Per Day/30 Days ary Discharge Instructions: thin layer to wound bed Secondary Dressing: ABD Pad, 8x10 (Generic) 1 x Per Day/30 Days Discharge Instructions: Apply over primary dressing as directed. Secondary Dressing: T Non-Adherent Dressing, 3x4 in (DME) (Generic) 1 x Per Day/30 Days elfa Discharge Instructions: Apply over primary dressing as directed. Secondary Dressing: NonWoven Sponge, 4x4 (in/in) (Generic) 1 x Per Day/30 Days Discharge Instructions: Apply over primary dressing as directed Secured With: Kerlix Roll Sterile, 4.5x3.1 (in/yd) 1 x Per Day/30 Days Discharge Instructions: Secure with Kerlix as directed. Secured With: Transpore Surgical T ape, 2x10 (in/yd) 1 x Per Day/30 Days Discharge Instructions: Secure dressing with tape as directed. WOUND #2: - Foot Wound Laterality: Dorsal, Right Cleanser: Soap and Water 1 x Per Day/30 Days Discharge Instructions: May shower and wash wound with dial antibacterial soap and water prior to dressing change. Cleanser: Wound Cleanser 1 x Per Day/30 Days Discharge Instructions: Cleanse the wound with wound cleanser prior to applying a clean dressing using gauze sponges, not tissue or cotton balls. Prim Dressing: Adaptic T ary ouch Silicone Non-Adherent Dressing, 3x4(in/in) (DME) (Generic) 1 x Per Day/30 Days Prim Dressing: keystone antibiotic compound 1 x Per Day/30 Days ary Discharge Instructions: thin layer to wound bed Secondary Dressing: ABD Pad, 8x10 (Generic) 1 x Per Day/30  Days Discharge Instructions: Apply over primary dressing as directed. Secondary Dressing: T Non-Adherent Dressing, 3x4 in (DME) (Generic) 1 x Per Day/30 Days elfa Discharge Instructions: Apply over primary dressing as directed. Secondary Dressing: NonWoven Sponge, 4x4 (in/in) (  Generic) 1 x Per Day/30 Days Discharge Instructions: Apply over primary dressing as directed Secured With: Kerlix Roll Sterile, 4.5x3.1 (in/yd) 1 x Per Day/30 Days Discharge Instructions: Secure with Kerlix as directed. Secured With: Transpore Surgical T ape, 2x10 (in/yd) 1 x Per Day/30 Days Discharge Instructions: Secure dressing with tape as directed. WOUND #3: - Ankle Wound Laterality: Left, Medial Cleanser: Soap and Water 1 x Per Day/30 Days Discharge Instructions: May shower and wash wound with dial antibacterial soap and water prior to dressing change. Cleanser: Wound Cleanser 1 x Per Day/30 Days Discharge Instructions: Cleanse the wound with wound cleanser prior to applying a clean dressing using gauze sponges, not tissue or cotton balls. Prim Dressing: Adaptic T ary ouch Silicone Non-Adherent Dressing, 3x4(in/in) (DME) (Generic) 1 x Per Day/30 Days Prim Dressing: keystone antibiotic compound 1 x Per Day/30 Days ary Discharge Instructions: thin layer to wound bed Secondary Dressing: ABD Pad, 8x10 (Generic) 1 x Per Day/30 Days Discharge Instructions: Apply over primary dressing as directed. Secondary Dressing: T Non-Adherent Dressing, 3x4 in (DME) (Generic) 1 x Per Day/30 Days elfa Discharge Instructions: Apply over primary dressing as directed. Secondary Dressing: NonWoven Sponge, 4x4 (in/in) (Generic) 1 x Per Day/30 Days Discharge Instructions: Apply over primary dressing as directed Secured With: Kerlix Roll Sterile, 4.5x3.1 (in/yd) 1 x Per Day/30 Days Discharge Instructions: Secure with Kerlix as directed. Secured With: Transpore Surgical T ape, 2x10 (in/yd) 1 x Per Day/30 Days Discharge Instructions:  Secure dressing with tape as directed. WOUND #4: - Ankle Wound Laterality: Left, Lateral Cleanser: Soap and Water 1 x Per Day/30 Days Discharge Instructions: May shower and wash wound with dial antibacterial soap and water prior to dressing change. Cleanser: Wound Cleanser 1 x Per Day/30 Days Discharge Instructions: Cleanse the wound with wound cleanser prior to applying a clean dressing using gauze sponges, not tissue or cotton balls. Prim Dressing: Adaptic T ary ouch Silicone Non-Adherent Dressing, 3x4(in/in) (DME) (Generic) 1 x Per Day/30 Days Prim Dressing: keystone antibiotic compound 1 x Per Day/30 Days ary Discharge Instructions: thin layer to wound bed Secondary Dressing: ABD Pad, 8x10 (Generic) 1 x Per Day/30 Days Discharge Instructions: Apply over primary dressing as directed. Secondary Dressing: T Non-Adherent Dressing, 3x4 in (DME) (Generic) 1 x Per Day/30 Days elfa Discharge Instructions: Apply over primary dressing as directed. Secondary Dressing: NonWoven Sponge, 4x4 (in/in) (Generic) 1 x Per Day/30 Days Discharge Instructions: Apply over primary dressing as directed Secured With: Kerlix Roll Sterile, 4.5x3.1 (in/yd) 1 x Per Day/30 Days Discharge Instructions: Secure with Kerlix as directed. Secured With: Transpore Surgical T ape, 2x10 (in/yd) 1 x Per Day/30 Days Discharge Instructions: Secure dressing with tape as directed. WOUND #5: - Foot Wound Laterality: Dorsal, Left Cleanser: Soap and Water 1 x Per Day/30 Days Discharge Instructions: May shower and wash wound with dial antibacterial soap and water prior to dressing change. Cleanser: Wound Cleanser 1 x Per Day/30 Days Discharge Instructions: Cleanse the wound with wound cleanser prior to applying a clean dressing using gauze sponges, not tissue or cotton balls. Prim Dressing: Adaptic T ary ouch Silicone Non-Adherent Dressing, 3x4(in/in) (DME) (Generic) 1 x Per Day/30 Days Prim Dressing: keystone antibiotic compound 1  x Per Day/30 Days ary Discharge Instructions: thin layer to wound bed Secondary Dressing: ABD Pad, 8x10 (Generic) 1 x Per Day/30 Days Discharge Instructions: Apply over primary dressing as directed. Secondary Dressing: T Non-Adherent Dressing, 3x4 in (DME) (Generic) 1 x Per Day/30 Days elfa Discharge Instructions: Apply over primary dressing as directed. Secondary  Dressing: NonWoven Sponge, 4x4 (in/in) (Generic) 1 x Per Day/30 Days Discharge Instructions: Apply over primary dressing as directed Secured With: Kerlix Roll Sterile, 4.5x3.1 (in/yd) 1 x Per Day/30 Days Discharge Instructions: Secure with Kerlix as directed. Secured With: Transpore Surgical T ape, 2x10 (in/yd) 1 x Per Day/30 Days Discharge Instructions: Secure dressing with tape as directed. WOUND #6: - Foot Wound Laterality: Left, Medial, Distal Cleanser: Soap and Water 1 x Per Day/30 Days Discharge Instructions: May shower and wash wound with dial antibacterial soap and water prior to dressing change. Cleanser: Wound Cleanser 1 x Per Day/30 Days Discharge Instructions: Cleanse the wound with wound cleanser prior to applying a clean dressing using gauze sponges, not tissue or cotton balls. Prim Dressing: Adaptic T ary ouch Silicone Non-Adherent Dressing, 3x4(in/in) (DME) (Generic) 1 x Per Day/30 Days Prim Dressing: keystone antibiotic compound 1 x Per Day/30 Days ary Discharge Instructions: thin layer to wound bed Secondary Dressing: ABD Pad, 8x10 (Generic) 1 x Per Day/30 Days Discharge Instructions: Apply over primary dressing as directed. Secondary Dressing: T Non-Adherent Dressing, 3x4 in (DME) (Generic) 1 x Per Day/30 Days elfa Discharge Instructions: Apply over primary dressing as directed. Secondary Dressing: NonWoven Sponge, 4x4 (in/in) (Generic) 1 x Per Day/30 Days Discharge Instructions: Apply over primary dressing as directed Secured With: Kerlix Roll Sterile, 4.5x3.1 (in/yd) 1 x Per Day/30 Days Discharge  Instructions: Secure with Kerlix as directed. Secured With: Transpore Surgical T ape, 2x10 (in/yd) 1 x Per Day/30 Days Discharge Instructions: Secure dressing with tape as directed. WOUND #7: - Ankle Wound Laterality: Right, Lateral Cleanser: Soap and Water 1 x Per Day/30 Days Discharge Instructions: May shower and wash wound with dial antibacterial soap and water prior to dressing change. Cleanser: Wound Cleanser 1 x Per Day/30 Days Discharge Instructions: Cleanse the wound with wound cleanser prior to applying a clean dressing using gauze sponges, not tissue or cotton balls. Prim Dressing: Adaptic T ary ouch Silicone Non-Adherent Dressing, 3x4(in/in) (DME) (Generic) 1 x Per Day/30 Days Prim Dressing: keystone antibiotic compound 1 x Per Day/30 Days ary Discharge Instructions: thin layer to wound bed Secondary Dressing: ABD Pad, 8x10 (Generic) 1 x Per Day/30 Days Discharge Instructions: Apply over primary dressing as directed. Secondary Dressing: T Non-Adherent Dressing, 3x4 in (DME) (Generic) 1 x Per Day/30 Days elfa Discharge Instructions: Apply over primary dressing as directed. Secondary Dressing: NonWoven Sponge, 4x4 (in/in) (Generic) 1 x Per Day/30 Days Discharge Instructions: Apply over primary dressing as directed Secured With: Kerlix Roll Sterile, 4.5x3.1 (in/yd) 1 x Per Day/30 Days Discharge Instructions: Secure with Kerlix as directed. Secured With: Transpore Surgical T ape, 2x10 (in/yd) 1 x Per Day/30 Days Discharge Instructions: Secure dressing with tape as directed. 1. Keystone antibiotic with Adaptic 2. Follow-up as neededoowe will place her on the schedule for 2 weeks however if she has surgery this will be canceled. Electronic Signature(s) Signed: 08/23/2021 2:41:13 PM By: Kalman Shan DO Entered By: Kalman Shan on 08/23/2021 14:40:21 -------------------------------------------------------------------------------- HxROS Details Patient Name: Date of  Service: [name], [name] 08/23/2021 2:00 PM Medical Record Number: [id number] Patient Account Number: [id number] Date of Birth/Sex: Treating RN: 1966-04-15 (56 y.o. [name] Primary Care Provider: [name] Other Clinician: Referring Provider: Treating Provider/Extender: Darcey Nora in Treatment: 26 Information Obtained From Patient Hematologic/Lymphatic Medical History: Positive for: Anemia Cardiovascular Medical History: Positive for: Arrhythmia - A-Fib; Congestive Heart Failure; Hypertension Negative for: Angina Endocrine Medical History: Positive for: Type II Diabetes Past Medical  History Notes: Grave's disease Time with diabetes: over 10 years Treated with: Diet Blood sugar tested every day: No Immunological Medical History: Positive for: Lupus Erythematosus Immunizations Pneumococcal Vaccine: Received Pneumococcal Vaccination: No Implantable Devices None Family and Social History Unknown History: Yes; Never smoker; Marital Status - Single; Alcohol Use: Never; Drug Use: No History; Caffeine Use: Rarely; Financial Concerns: No; Food, Clothing or Shelter Needs: No; Support System Lacking: No; Transportation Concerns: No Electronic Signature(s) Signed: 08/23/2021 2:41:13 PM By: Kalman Shan DO Signed: 09/02/2021 5:00:00 PM By: Lorrin Jackson Entered By: Kalman Shan on 08/23/2021 14:37:43 -------------------------------------------------------------------------------- SuperBill Details Patient Name: Date of Service: [name], [name] 08/23/2021 Medical Record Number: [id number] Patient Account Number: [id number] Date of Birth/Sex: Treating RN: 08-26-1965 (56 y.o. [name], [name] Primary Care Provider: [name] Other Clinician: Referring Provider: Treating Provider/Extender: Darcey Nora in Treatment: 26 Diagnosis Coding ICD-10 Codes Code  Description [phone] Non-pressure chronic ulcer of other part of right foot with fat layer exposed L97.522 Non-pressure chronic ulcer of other part of left foot with fat layer exposed L93.2 Other local lupus erythematosus F38.32 Chronic diastolic (congestive) heart failure I10 Essential (primary) hypertension E11.621 Type 2 diabetes mellitus with foot ulcer Facility Procedures CPT4 Code: [id number] Description: 00459 - WOUND CARE VISIT-LEV 5 EST PT Modifier: Quantity: 1 Physician Procedures Electronic Signature(s) Signed: 08/26/2021 5:08:23 PM By: Kalman Shan DO Previous Signature: 08/23/2021 3:56:16 PM Version By: Kalman Shan DO Previous Signature: 08/23/2021 5:21:26 PM Version By: [name] RN Previous Signature: 08/23/2021 2:41:13 PM Version By: Kalman Shan DO Entered By: Kalman Shan on 08/26/2021 17:06:16

## 2021-09-02 NOTE — Progress Notes (Addendum)
Candace, Robinson ([id number]) Visit Report for 08/23/2021 Arrival Information Details Patient Name: Date of Service: Candace Robinson, Candace RO L L. 08/23/2021 2:00 PM Medical Record Number: [id number] Patient Account Number: [id number] Date of Birth/Sex: Treating RN: February 12, 1966 (56 y.o. Candace Robinson, Candace Robinson Primary Care Candace Robinson: Candace Robinson Other Clinician: Referring Candace Robinson: Treating Candace Robinson/Extender: Darcey Nora in Treatment: 49 Visit Information History Since Last Visit Added or deleted any medications: No Patient Arrived: Candace Robinson Any new allergies or adverse reactions: No Arrival Time: 13:57 Had a fall or experienced change in No Accompanied By: self activities of daily living that may affect Transfer Assistance: Manual risk of falls: Patient Identification Verified: Yes Signs or symptoms of abuse/neglect since last visito No Secondary Verification Process Completed: Yes Hospitalized since last visit: No Patient Requires Transmission-Based No Implantable device outside of the clinic excluding No Precautions: cellular tissue based products placed in the center Patient Has Alerts: Yes since last visit: Patient Alerts: R ABI: 1.0 L ABI: 1.1 Has Dressing in Place as Prescribed: Yes NO BENZOCAINE Pain Present Now: Yes USE LIDOCAINE GEL ONLY Electronic Signature(s) Signed: 08/23/2021 5:21:26 PM By: Candace Hammock RN Entered By: Candace Robinson on 08/23/2021 13:58:08 -------------------------------------------------------------------------------- Clinic Level of Care Assessment Details Patient Name: Date of Service: Candace Robinson, Candace RO L L. 08/23/2021 2:00 PM Medical Record Number: [id number] Patient Account Number: [id number] Date of Birth/Sex: Treating RN: 12-10-1965 (56 y.o. Candace Robinson, Candace Robinson Primary Care Candace Robinson: Candace Robinson Other Clinician: Referring Candace Robinson: Treating Candace Robinson/Extender: Darcey Nora in Treatment: 26 Clinic Level of Care Assessment Items TOOL 4 Quantity Score Robinson- 1 0 Use when only an EandM is performed on FOLLOW-UP visit ASSESSMENTS - Nursing Assessment / Reassessment Robinson- 1 10 Reassessment of Co-morbidities (includes updates in patient status) Robinson- 1 5 Reassessment of Adherence to Treatment Plan ASSESSMENTS - Wound and Skin A ssessment / Reassessment []  - 0 Simple Wound Assessment / Reassessment - one wound Robinson- 7 5 Complex Wound Assessment / Reassessment - multiple wounds []  - 0 Dermatologic / Skin Assessment (not related to wound area) ASSESSMENTS - Focused Assessment Robinson- 2 5 Circumferential Edema Measurements - multi extremities []  - 0 Nutritional Assessment / Counseling / Intervention []  - 0 Lower Extremity Assessment (monofilament, tuning fork, pulses) []  - 0 Peripheral Arterial Disease Assessment (using hand held doppler) ASSESSMENTS - Ostomy and/or Continence Assessment and Care []  - 0 Incontinence Assessment and Management []  - 0 Ostomy Care Assessment and Management (repouching, etc.) PROCESS - Coordination of Care []  - 0 Simple Patient / Family Education for ongoing care Robinson- 1 20 Complex (extensive) Patient / Family Education for ongoing care Robinson- 1 10 Staff obtains Programmer, systems, Records, T Results / Process Orders est Robinson- 1 10 Staff telephones HHA, Nursing Homes / Clarify orders / etc []  - 0 Routine Transfer to another Facility (non-emergent condition) []  - 0 Routine Hospital Admission (non-emergent condition) []  - 0 New Admissions / Biomedical engineer / Ordering NPWT Apligraf, etc. , []  - 0 Emergency Hospital Admission (emergent condition) []  - 0 Simple Discharge Coordination Robinson- 1 15 Complex (extensive) Discharge Coordination PROCESS - Special Needs []  - 0 Pediatric / Minor Patient Management []  - 0 Isolation Patient Management []  - 0 Hearing / Language / Visual special needs []  - 0 Assessment of Community assistance  (transportation, D/C planning, etc.) []  - 0 Additional assistance / Altered mentation []  - 0 Support Surface(s) Assessment (bed, cushion, seat, etc.) INTERVENTIONS - Wound Cleansing / Measurement []  -  0 Simple Wound Cleansing - one wound Robinson- 7 5 Complex Wound Cleansing - multiple wounds Robinson- 1 5 Wound Imaging (photographs - any number of wounds) []  - 0 Wound Tracing (instead of photographs) []  - 0 Simple Wound Measurement - one wound Robinson- 7 5 Complex Wound Measurement - multiple wounds INTERVENTIONS - Wound Dressings []  - 0 Small Wound Dressing one or multiple wounds []  - 0 Medium Wound Dressing one or multiple wounds Robinson- 7 20 Large Wound Dressing one or multiple wounds []  - 0 Application of Medications - topical []  - 0 Application of Medications - injection INTERVENTIONS - Miscellaneous []  - 0 External ear exam []  - 0 Specimen Collection (cultures, biopsies, blood, body fluids, etc.) []  - 0 Specimen(s) / Culture(s) sent or taken to Lab for analysis []  - 0 Patient Transfer (multiple staff / Civil Service fast streamer / Similar devices) []  - 0 Simple Staple / Suture removal (25 or less) []  - 0 Complex Staple / Suture removal (26 or more) []  - 0 Hypo / Hyperglycemic Management (close monitor of Blood Glucose) []  - 0 Ankle / Brachial Index (ABI) - do not check if billed separately Robinson- 1 5 Vital Signs Has the patient been seen at the hospital within the last three years: Yes Total Score: 335 Level Of Care: New/Established - Level 5 Electronic Signature(s) Signed: 08/23/2021 5:21:26 PM By: Candace Hammock RN Entered By: Candace Robinson on 08/23/2021 14:39:52 -------------------------------------------------------------------------------- Encounter Discharge Information Details Patient Name: Date of Service: Candace Robinson, Candace RO L L. 08/23/2021 2:00 PM Medical Record Number: [id number] Patient Account Number: [id number] Date of Birth/Sex: Treating RN: September 06, 1965 (56 y.o. Candace Robinson,  Candace Robinson Primary Care Chezney Huether: Candace Robinson Other Clinician: Referring [name]: Treating [name]/Extender: Darcey Nora in Treatment: 26 Encounter Discharge Information Items Discharge Condition: Stable Ambulatory Status: Ambulatory Discharge Destination: Home Transportation: Private Auto Accompanied By: self Schedule Follow-up Appointment: Yes Clinical Summary of Care: Patient Declined Electronic Signature(s) Signed: 08/23/2021 5:21:26 PM By: Candace Hammock RN Entered By: Candace Robinson on 08/23/2021 14:42:21 -------------------------------------------------------------------------------- Lower Extremity Assessment Details Patient Name: Date of Service: Candace Robinson, Candace RO L L. 08/23/2021 2:00 PM Medical Record Number: [id number] Patient Account Number: [id number] Date of Birth/Sex: Treating RN: May 06, 1966 (56 y.o. Candace Robinson, Candace Robinson Primary Care Acire Tang: Candace Robinson Other Clinician: Referring Elson Ulbrich: Treating Jizel Cheeks/Extender: [name] Weeks in Treatment: 26 Edema Assessment Assessed: [name]: Yes] [name]: Yes] Edema: [Left: Yes] [Right: Yes] Calf Left: Right: Point of Measurement: 29 cm From Medial Instep 34.5 cm 35.5 cm Ankle Left: Right: Point of Measurement: 10 cm From Medial Instep 23.5 cm 24.5 cm Vascular Assessment Pulses: Dorsalis Pedis Palpable: [Left:Yes] [Right:Yes] Posterior Tibial Palpable: [Left:Yes] [Right:Yes] Electronic Signature(s) Signed: 08/23/2021 5:21:26 PM By: Candace Hammock RN Entered By: Candace Robinson on 08/23/2021 14:00:45 -------------------------------------------------------------------------------- Multi Wound Chart Details Patient Name: Date of Service: Candace Robinson, Candace RO L L. 08/23/2021 2:00 PM Medical Record Number: [id number] Patient Account Number: [id number] Date of Birth/Sex: Treating RN: [birth year] (56 y.o. [name] Primary Care Jaselynn Tamas:  Candace Robinson Other Clinician: Referring Roma Bierlein: Treating Aldwin Micalizzi/Extender: [name] Weeks in Treatment: 26 Vital Signs Height(in): 63 Pulse(bpm): 106 Weight(lbs): 157 Blood Pressure(mmHg): 122/76 Body Mass Index(BMI): 27.8 Temperature(F): 100.2 Respiratory Rate(breaths/min): 17 Photos: [1:No Photos Right, Medial Ankle] [2:No Photos Right, Dorsal Foot] [3:No Photos Left, Medial Ankle] Wound Location: [1:Gradually Appeared] [2:Gradually Appeared] [3:Gradually Appeared] Wounding Event: [1:Diabetic Wound/Ulcer of the Lower] [2:Diabetic Wound/Ulcer of the Lower] [3:Diabetic Wound/Ulcer of  the Lower] Primary Etiology: [1:Extremity Anemia, Arrhythmia, Congestive Heart Anemia, Arrhythmia, Congestive Heart Anemia, Arrhythmia, Congestive Heart] [2:Extremity] [3:Extremity] Comorbid History: [1:Failure, Hypertension, Type II Diabetes, Lupus Erythematosus 04/03/2020] [2:Failure, Hypertension, Type II Diabetes, Lupus Erythematosus 04/03/2020] [3:Failure, Hypertension, Type II Diabetes, Lupus Erythematosus 02/22/2021] Date Acquired: [1:26] [2:26] [3:26] Weeks of Treatment: [1:Open] [2:Open] [3:Open] Wound Status: [1:No] [2:No] [3:No] Wound Recurrence: [1:No] [2:No] [3:No] Clustered Wound: [1:N/A] [2:N/A] [3:N/A] Clustered Quantity: [1:7.4x6.8x0.4] [2:6x5.5x0.2] [3:1.2x2.3x0.2] Measurements [name] [name] D (cm) [1:39.521] [2:25.918] [3:2.168] A (cm) : rea [1:15.808] [2:5.184] [3:0.434] Volume (cm) : [1:-139.60%] [2:-120.00%] [3:-10.40%] % Reduction in A rea: [1:-379.20%] [2:-340.10%] [3:-10.40%] % Reduction in Volume: [1:Grade 2] [2:Grade 2] [3:Grade 2] Classification: [1:Medium] [2:Medium] [3:Medium] Exudate A mount: [1:Serosanguineous] [2:Serosanguineous] [3:Serosanguineous] Exudate Type: [1:red, brown] [2:red, brown] [3:red, brown] Exudate Color: [1:Yes] [2:No] [3:No] Foul Odor A Cleansing: [1:fter No] [2:N/A] [3:N/A] Odor A nticipated Due to Product Use:  [1:Distinct, outline attached] [2:Distinct, outline attached] [3:Distinct, outline attached] Wound Margin: [1:Small (1-33%)] [2:Small (1-33%)] [3:Small (1-33%)] Granulation A mount: [1:Red, Pink] [2:Pink] [3:Pink] Granulation Quality: [1:Large (67-100%)] [2:Large (67-100%)] [3:Large (67-100%)] Necrotic A mount: [1:Adherent Slough] [2:Eschar, Adherent Slough] [3:Adherent Slough] Necrotic Tissue: [1:Fat Layer (Subcutaneous Tissue): Yes Fat Layer (Subcutaneous Tissue): Yes Fat Layer (Subcutaneous Tissue): Yes] Exposed Structures: [1:Fascia: No Tendon: No Muscle: No Joint: No Bone: No None] [2:Fascia: No Tendon: No Muscle: No Joint: No Bone: No None] [3:Fascia: No Tendon: No Muscle: No Joint: No Bone: No None] Wound Number: 4 5 6  Photos: No Photos No Photos No Photos Left, Lateral Ankle Left, Dorsal Foot Left, Distal, Medial Foot Wound Location: Gradually Appeared Gradually Appeared Gradually Appeared Wounding Event: Diabetic Wound/Ulcer of the Lower Diabetic Wound/Ulcer of the Lower Diabetic Wound/Ulcer of the Lower Primary Etiology: Extremity Extremity Extremity Anemia, Arrhythmia, Congestive Heart Anemia, Arrhythmia, Congestive Heart Anemia, Arrhythmia, Congestive Heart Comorbid History: Failure, Hypertension, Type II Failure, Hypertension, Type II Failure, Hypertension, Type II Diabetes, Lupus Erythematosus Diabetes, Lupus Erythematosus Diabetes, Lupus Erythematosus 04/03/2020 04/03/2020 04/03/2020 Date Acquired: 26 26 26  Weeks of Treatment: Open Open Open Wound Status: No No No Wound Recurrence: No Yes No Clustered Wound: N/A 2 N/A Clustered Quantity: 8.5x5.5x0.4 9.8x9.7x0.3 0.9x1.4x0.2 Measurements [name] [name] D (cm) 36.717 74.66 0.99 A (cm) : rea 14.687 22.398 0.198 Volume (cm) : 2.60% -24.30% -215.30% % Reduction in A rea: -94.80% -272.80% -538.70% % Reduction in Volume: Grade 2 Grade 2 Grade 2 Classification: Medium Medium Small Exudate A mount: Serosanguineous  Serosanguineous Serosanguineous Exudate Type: red, brown red, brown red, brown Exudate Color: No No No Foul Odor A Cleansing: fter N/A N/A N/A Odor A nticipated Due to Product Use: Distinct, outline attached Distinct, outline attached Distinct, outline attached Wound Margin: Medium (34-66%) Small (1-33%) Small (1-33%) Granulation A mount: Red, Pink N/A N/A Granulation Quality: Medium (34-66%) Large (67-100%) Large (67-100%) Necrotic A mount: Adherent Rosita Necrotic Tissue: Fat Layer (Subcutaneous Tissue): Yes Fat Layer (Subcutaneous Tissue): Yes Fat Layer (Subcutaneous Tissue): Yes Exposed Structures: Fascia: No Fascia: No Fascia: No Tendon: No Tendon: No Tendon: No Muscle: No Muscle: No Muscle: No Joint: No Joint: No Joint: No Bone: No Bone: No Bone: No None None Small (1-33%) Epithelialization: Wound Number: 7 N/A N/A Photos: No Photos N/A N/A Right, Lateral Ankle N/A N/A Wound Location: Gradually Appeared N/A N/A Wounding Event: Lupus N/A N/A Primary Etiology: Anemia, Arrhythmia, Congestive Heart N/A N/A Comorbid History: Failure, Hypertension, Type II Diabetes, Lupus Erythematosus 04/08/2021 N/A N/A Date Acquired: 84 N/A N/A Weeks of Treatment:  Open N/A N/A Wound Status: No N/A N/A Wound Recurrence: No N/A N/A Clustered Wound: N/A N/A N/A Clustered Quantity: 1.6x1.6x0.2 N/A N/A Measurements [name] [name] D (cm) 2.011 N/A N/A A (cm) : rea 0.402 N/A N/A Volume (cm) : -6387.10% N/A N/A % Reduction in Area: -13300.00% N/A N/A % Reduction in Volume: Full Thickness Without Exposed N/A N/A Classification: Support Structures Medium N/A N/A Exudate A mount: Serosanguineous N/A N/A Exudate Type: red, brown N/A N/A Exudate Color: No N/A N/A Foul Odor A Cleansing: fter N/A N/A N/A Odor Anticipated Due to Product Use: Distinct, outline attached N/A N/A Wound Margin: Small (1-33%) N/A N/A Granulation A  mount: Pink N/A N/A Granulation Quality: Large (67-100%) N/A N/A Necrotic Amount: Adherent Slough N/A N/A Necrotic Tissue: Fat Layer (Subcutaneous Tissue): Yes N/A N/A Exposed Structures: Fascia: No Tendon: No Muscle: No Joint: No Bone: No Small (1-33%) N/A N/A Epithelialization: Treatment Notes Electronic Signature(s) Signed: 08/23/2021 2:41:13 PM By: Kalman Shan DO Signed: 09/02/2021 5:00:00 PM By: Lorrin Jackson Entered By: Kalman Shan on 08/23/2021 14:36:29 -------------------------------------------------------------------------------- Multi-Disciplinary Care Plan Details Patient Name: Date of Service: Candace Robinson, Candace RO L L. 08/23/2021 2:00 PM Medical Record Number: [id number] Patient Account Number: [id number] Date of Birth/Sex: Treating RN: [birth year] (56 y.o. Candace Robinson, Candace Robinson Primary Care Raiden Haydu: Candace Robinson Other Clinician: Referring Roberta Angell: Treating Braiden Rodman/Extender: Darcey Nora in Treatment: Scott reviewed with physician Active Inactive Electronic Signature(s) Signed: 10/22/2021 10:25:18 AM By: Deon Pilling RN, BSN Signed: 04/01/2022 11:55:26 AM By: Candace Hammock RN Previous Signature: 08/23/2021 5:21:26 PM Version By: Candace Hammock RN Entered By: Deon Pilling on 10/22/2021 10:25:16 -------------------------------------------------------------------------------- Pain Assessment Details Patient Name: Date of Service: Candace Robinson, Candace RO L L. 08/23/2021 2:00 PM Medical Record Number: [id number] Patient Account Number: [id number] Date of Birth/Sex: Treating RN: 07-02-1966 (56 y.o. Candace Robinson, Candace Robinson Primary Care Chelise Hanger: Candace Robinson Other Clinician: Referring Brahim Dolman: Treating Deagan Sevin/Extender: Darcey Nora in Treatment: 26 Active Problems Location of Pain Severity and Description of Pain Patient Has Paino Yes Site Locations Pain  Location: Generalized Pain, Pain in Ulcers With Dressing Change: Yes Duration of the Pain. Constant / Intermittento Intermittent Rate the pain. Current Pain Level: 6 Worst Pain Level: 10 Least Pain Level: 0 Tolerable Pain Level: 6 Character of Pain Describe the Pain: Aching Pain Management and Medication Current Pain Management: Medication: No Cold Application: No Rest: No Massage: No Activity: No T.E.N.S.: No Heat Application: No Leg drop or elevation: No Is the Current Pain Management Adequate: Adequate How does your wound impact your activities of daily livingo Sleep: No Bathing: No Appetite: No Relationship With Others: No Bladder Continence: No Emotions: No Bowel Continence: No Work: No Toileting: No Drive: No Dressing: No Hobbies: No Electronic Signature(s) Signed: 08/23/2021 5:21:26 PM By: Candace Hammock RN Entered By: Candace Robinson on 08/23/2021 14:00:35 -------------------------------------------------------------------------------- Patient/Caregiver Education Details Patient Name: Date of Service: Candace Robinson Candace Robinson, Candace RO L L. 2/20/2023andnbsp2:00 PM Medical Record Number: [id number] Patient Account Number: [id number] Date of Birth/Gender: Treating RN: [birth year] (56 y.o. Candace Robinson, Candace Robinson Primary Care Physician: Candace Robinson Other Clinician: Referring Physician: Treating Physician/Extender: Darcey Nora in Treatment: 26 Education Assessment Education Provided To: Patient Education Topics Provided Wound/Skin Impairment: Methods: Explain/Verbal Responses: Reinforcements needed, State content correctly [employer]) Signed: 08/23/2021 5:21:26 PM By: Candace Hammock RN Entered By: Candace Robinson on 08/23/2021 14:38:23 -------------------------------------------------------------------------------- Wound Assessment Details Patient Name: Date of Service: Candace Robinson  Robinson, Candace RO L L. 08/23/2021 2:00  PM Medical Record Number: [id number] Patient Account Number: [id number] Date of Birth/Sex: Treating RN: 04-29-1966 (56 y.o. Candace Robinson, Candace Robinson Primary Care Payzlee Ryder: Candace Robinson Other Clinician: Referring Lani Mendiola: Treating Somya Jauregui/Extender: [name] Weeks in Treatment: 26 Wound Status Wound Number: 1 Primary Diabetic Wound/Ulcer of the Lower Extremity Etiology: Wound Location: Right, Medial Ankle Wound Open Wounding Event: Gradually Appeared Status: Date Acquired: 04/03/2020 Comorbid Anemia, Arrhythmia, Congestive Heart Failure, Hypertension, Weeks Of Treatment: 26 Weeks Of Treatment: 26 History: Type II Diabetes, Lupus Erythematosus Clustered Wound: No Wound Measurements Length: (cm) 7.4 Width: (cm) 6.8 Depth: (cm) 0.4 Area: (cm) 39.521 Volume: (cm) 15.808 % Reduction in Area: -139.6% % Reduction in Volume: -379.2% Epithelialization: None Tunneling: No Undermining: No Wound Description Classification: Grade 2 Wound Margin: Distinct, outline attached Exudate Amount: Medium Exudate Type: Serosanguineous Exudate Color: red, brown Foul Odor After Cleansing: Yes Due to Product Use: No Slough/Fibrino Yes Wound Bed Granulation Amount: Small (1-33%) Exposed Structure Granulation Quality: Red, Pink Fascia Exposed: No Necrotic Amount: Large (67-100%) Fat Layer (Subcutaneous Tissue) Exposed: Yes Necrotic Quality: Adherent Slough Tendon Exposed: No Muscle Exposed: No Joint Exposed: No Bone Exposed: No Electronic Signature(s) Signed: 08/23/2021 5:21:26 PM By: Candace Hammock RN Entered By: Candace Robinson on 08/23/2021 17:19:31 -------------------------------------------------------------------------------- Wound Assessment Details Patient Name: Date of Service: Candace Robinson, Candace RO L L. 08/23/2021 2:00 PM Medical Record Number: [id number] Patient Account Number: [id number] Date of Birth/Sex: Treating RN: [birth year] (56 y.o. Candace Robinson, Candace Robinson Primary Care Pattijo Juste: Candace Robinson Other Clinician: Referring Autry Droege: Treating Yousef Huge/Extender: [name] Weeks in Treatment: 26 Wound Status Wound Number: 2 Primary Diabetic Wound/Ulcer of the Lower Extremity Etiology: Wound Location: Right, Dorsal Foot Wound Open Wounding Event: Gradually Appeared Status: Date Acquired: 04/03/2020 Comorbid Anemia, Arrhythmia, Congestive Heart Failure, Hypertension, Weeks Of Treatment: 26 History: Type II Diabetes, Lupus Erythematosus Clustered Wound: No Wound Measurements Length: (cm) 6 Width: (cm) 5.5 Depth: (cm) 0.2 Area: (cm) 25.918 Volume: (cm) 5.184 % Reduction in Area: -120% % Reduction in Volume: -340.1% Epithelialization: None Tunneling: No Undermining: No Wound Description Classification: Grade 2 Wound Margin: Distinct, outline attached Exudate Amount: Medium Exudate Type: Serosanguineous Exudate Color: red, brown Foul Odor After Cleansing: No Slough/Fibrino Yes Wound Bed Granulation Amount: Small (1-33%) Exposed Structure Granulation Quality: Pink Fascia Exposed: No Necrotic Amount: Large (67-100%) Fat Layer (Subcutaneous Tissue) Exposed: Yes Necrotic Quality: Eschar, Adherent Slough Tendon Exposed: No Muscle Exposed: No Joint Exposed: No Bone Exposed: No Electronic Signature(s) Signed: 08/23/2021 5:21:26 PM By: Candace Hammock RN Entered By: Candace Robinson on 08/23/2021 14:18:45 -------------------------------------------------------------------------------- Wound Assessment Details Patient Name: Date of Service: Candace Robinson, Candace RO L L. 08/23/2021 2:00 PM Medical Record Number: [id number] Patient Account Number: [id number] Date of Birth/Sex: Treating RN: 04/16/1966 (56 y.o. Candace Robinson, Candace Robinson Primary Care Jaideep Pollack: Candace Robinson Other Clinician: Referring Jazlyne Gauger: Treating Radiance Deady/Extender: [name] Weeks in  Treatment: 26 Wound Status Wound Number: 3 Primary Diabetic Wound/Ulcer of the Lower Extremity Etiology: Wound Location: Left, Medial Ankle Wound Open Wounding Event: Gradually Appeared Status: Date Acquired: 02/22/2021 Comorbid Anemia, Arrhythmia, Congestive Heart Failure, Hypertension, Weeks Of Treatment: 26 History: Type II Diabetes, Lupus Erythematosus Clustered Wound: No Wound Measurements Length: (cm) 1.2 Width: (cm) 2.3 Depth: (cm) 0.2 Area: (cm) 2.168 Volume: (cm) 0.434 % Reduction in Area: -10.4% % Reduction in Volume: -10.4% Epithelialization: None Tunneling: No Undermining: No Wound Description Classification: Grade 2 Wound Margin: Distinct, outline attached Exudate Amount: Medium  Exudate Type: Serosanguineous Exudate Color: red, brown Foul Odor After Cleansing: No Slough/Fibrino Yes Wound Bed Granulation Amount: Small (1-33%) Exposed Structure Granulation Quality: Pink Fascia Exposed: No Necrotic Amount: Large (67-100%) Fat Layer (Subcutaneous Tissue) Exposed: Yes Necrotic Quality: Adherent Slough Tendon Exposed: No Muscle Exposed: No Joint Exposed: No Bone Exposed: No Electronic Signature(s) Signed: 08/23/2021 5:21:26 PM By: Candace Hammock RN Entered By: Candace Robinson on 08/23/2021 14:19:16 -------------------------------------------------------------------------------- Wound Assessment Details Patient Name: Date of Service: Candace Robinson, Candace RO L L. 08/23/2021 2:00 PM Medical Record Number: [id number] Patient Account Number: [id number] Date of Birth/Sex: Treating RN: 12/04/1965 (56 y.o. Candace Robinson, Candace Robinson Primary Care Noelly Lasseigne: Other Clinician: Leonie Robinson Referring Bahar Shelden: Treating Chanti Golubski/Extender: Darcey Nora in Treatment: 26 Wound Status Wound Number: 4 Primary Diabetic Wound/Ulcer of the Lower Extremity Etiology: Wound Location: Left, Lateral Ankle Wound Open Wounding Event: Gradually  Appeared Status: Date Acquired: 04/03/2020 Comorbid Anemia, Arrhythmia, Congestive Heart Failure, Hypertension, Weeks Of Treatment: 26 History: Type II Diabetes, Lupus Erythematosus Clustered Wound: No Wound Measurements Length: (cm) 8.5 Width: (cm) 5.5 Depth: (cm) 0.4 Area: (cm) 36.717 Volume: (cm) 14.687 % Reduction in Area: 2.6% % Reduction in Volume: -94.8% Epithelialization: None Tunneling: No Undermining: No Wound Description Classification: Grade 2 Wound Margin: Distinct, outline attached Exudate Amount: Medium Exudate Type: Serosanguineous Exudate Color: red, brown Foul Odor After Cleansing: No Slough/Fibrino Yes Wound Bed Granulation Amount: Medium (34-66%) Exposed Structure Granulation Quality: Red, Pink Fascia Exposed: No Necrotic Amount: Medium (34-66%) Fat Layer (Subcutaneous Tissue) Exposed: Yes Necrotic Quality: Adherent Slough Tendon Exposed: No Muscle Exposed: No Joint Exposed: No Bone Exposed: No Electronic Signature(s) Signed: 08/23/2021 5:21:26 PM By: Candace Hammock RN Entered By: Candace Robinson on 08/23/2021 14:19:55 -------------------------------------------------------------------------------- Wound Assessment Details Patient Name: Date of Service: Candace Robinson, Candace RO L L. 08/23/2021 2:00 PM Medical Record Number: [id number] Patient Account Number: [id number] Date of Birth/Sex: Treating RN: Jun 13, 1966 (56 y.o. Candace Robinson, Candace Robinson Primary Care Sergio Zawislak: Candace Robinson Other Clinician: Referring Breezie Micucci: Treating [place] Retana/Extender: [name] Weeks in Treatment: 26 Wound Status Wound Number: 5 Primary Diabetic Wound/Ulcer of the Lower Extremity Etiology: Wound Location: Left, Dorsal Foot Wound Open Wounding Event: Gradually Appeared Status: Date Acquired: 04/03/2020 Comorbid Anemia, Arrhythmia, Congestive Heart Failure, Hypertension, Weeks Of Treatment: 26 History: Type II Diabetes, Lupus  Erythematosus Clustered Wound: Yes Wound Measurements Length: (cm) 9.8 Width: (cm) 9.7 Depth: (cm) 0.3 Clustered Quantity: 2 Area: (cm) 74.66 Volume: (cm) 22.39 Wound Description Classification: Grade 2 Wound Margin: Distinct, outline attached Exudate Amount: Medium Exudate Type: Serosanguineous Exudate Color: red, brown Foul Odor After Cleansing: Slough/Fibrino % Reduction in Area: -24.3% % Reduction in Volume: -272.8% Epithelialization: None Tunneling: No Undermining: No 8 No Yes Wound Bed Granulation Amount: Small (1-33%) Exposed Structure Necrotic Amount: Large (67-100%) Fascia Exposed: No Necrotic Quality: Adherent Slough Fat Layer (Subcutaneous Tissue) Exposed: Yes Tendon Exposed: No Muscle Exposed: No Joint Exposed: No Bone Exposed: No Electronic Signature(s) Signed: 08/23/2021 5:21:26 PM By: Candace Hammock RN Entered By: Candace Robinson on 08/23/2021 14:16:22 -------------------------------------------------------------------------------- Wound Assessment Details Patient Name: Date of Service: Candace Robinson, Candace RO L L. 08/23/2021 2:00 PM Medical Record Number: [id number] Patient Account Number: [id number] Date of Birth/Sex: Treating RN: 1965/08/12 (56 y.o. Candace Robinson, Candace Robinson Primary Care Desarea Ohagan: Candace Robinson Other Clinician: Referring Wandell Scullion: Treating Baylyn Sickles/Extender: [name] Weeks in Treatment: 26 Wound Status Wound Number: 6 Primary Diabetic Wound/Ulcer of the Lower Extremity Etiology: Wound Location: Left, Distal, Medial Foot Wound Open  Wounding Event: Gradually Appeared Status: Date Acquired: 04/03/2020 Comorbid Anemia, Arrhythmia, Congestive Heart Failure, Hypertension, Weeks Of Treatment: 26 History: Type II Diabetes, Lupus Erythematosus Clustered Wound: No Wound Measurements Length: (cm) 0.9 Width: (cm) 1.4 Depth: (cm) 0.2 Area: (cm) 0.99 Volume: (cm) 0.198 % Reduction in Area: -215.3% %  Reduction in Volume: -538.7% Epithelialization: Small (1-33%) Tunneling: No Undermining: No Wound Description Classification: Grade 2 Wound Margin: Distinct, outline attached Exudate Amount: Small Exudate Type: Serosanguineous Exudate Color: red, brown Foul Odor After Cleansing: No Slough/Fibrino Yes Wound Bed Granulation Amount: Small (1-33%) Exposed Structure Necrotic Amount: Large (67-100%) Fascia Exposed: No Necrotic Quality: Adherent Slough Fat Layer (Subcutaneous Tissue) Exposed: Yes Tendon Exposed: No Muscle Exposed: No Joint Exposed: No Bone Exposed: No Electronic Signature(s) Signed: 08/23/2021 5:21:26 PM By: Candace Hammock RN Entered By: Candace Robinson on 08/23/2021 14:20:47 -------------------------------------------------------------------------------- Wound Assessment Details Patient Name: Date of Service: Candace Robinson, Candace RO L L. 08/23/2021 2:00 PM Medical Record Number: [id number] Patient Account Number: [id number] Date of Birth/Sex: Treating RN: 01/28/1966 (56 y.o. Candace Robinson, Candace Robinson Primary Care Deneen Slager: Candace Robinson Other Clinician: Referring Lisett Dirusso: Treating Aleea Hendry/Extender: [name] Weeks in Treatment: 26 Wound Status Wound Number: 7 Primary Lupus Etiology: Wound Location: Right, Lateral Ankle Wound Open Wounding Event: Gradually Appeared Status: Date Acquired: 04/08/2021 Comorbid Anemia, Arrhythmia, Congestive Heart Failure, Hypertension, Weeks Of Treatment: 19 History: Type II Diabetes, Lupus Erythematosus Clustered Wound: No Wound Measurements Length: (cm) 1.6 Width: (cm) 1.6 Depth: (cm) 0.2 Area: (cm) 2.011 Volume: (cm) 0.402 % Reduction in Area: -6387.1% % Reduction in Volume: -13300% Epithelialization: Small (1-33%) Tunneling: No Undermining: No Wound Description Classification: Full Thickness Without Exposed Support Structures Wound Margin: Distinct, outline attached Exudate Amount:  Medium Exudate Type: Serosanguineous Exudate Color: red, brown Foul Odor After Cleansing: No Slough/Fibrino Yes Wound Bed Granulation Amount: Small (1-33%) Exposed Structure Granulation Quality: Pink Fascia Exposed: No Necrotic Amount: Large (67-100%) Fat Layer (Subcutaneous Tissue) Exposed: Yes Necrotic Quality: Adherent Slough Tendon Exposed: No Muscle Exposed: No Joint Exposed: No Bone Exposed: No Electronic Signature(s) Signed: 08/23/2021 5:21:26 PM By: Candace Hammock RN Entered By: Candace Robinson on 08/23/2021 14:22:50 -------------------------------------------------------------------------------- Vitals Details Patient Name: Date of Service: Candace Robinson, Candace RO L L. 08/23/2021 2:00 PM Medical Record Number: [id number] Patient Account Number: [id number] Date of Birth/Sex: Treating RN: 10-26-1965 (56 y.o. Candace Robinson, Candace Robinson Primary Care Kaya Klausing: Candace Robinson Other Clinician: Referring Asad Keeven: Treating Dorotea Hand/Extender: Darcey Nora in Treatment: 26 Vital Signs Time Taken: 13:58 Temperature (F): 100.2 Height (in): 63 Pulse (bpm): 106 Weight (lbs): 157 Respiratory Rate (breaths/min): 17 Body Mass Index (BMI): 27.8 Blood Pressure (mmHg): 122/76 Reference Range: 80 - 120 mg / dl Electronic Signature(s) Signed: 08/23/2021 5:21:26 PM By: Candace Hammock RN Entered By: Candace Robinson on 08/23/2021 14:00:12

## 2021-09-02 NOTE — Telephone Encounter (Signed)
Patient left voicemail 09/01/2021 requesting surgery to be cancelled as dermatologist feels it would do more harm to have surgery. Advised provider. Cancelled surgery and necessary appointments.  ?

## 2021-09-06 ENCOUNTER — Ambulatory Visit (HOSPITAL_COMMUNITY): Admission: RE | Admit: 2021-09-06 | Payer: Medicaid Other | Source: Home / Self Care | Admitting: Plastic Surgery

## 2021-09-06 SURGERY — IRRIGATION AND DEBRIDEMENT WOUND
Anesthesia: General | Site: Ankle | Laterality: Bilateral

## 2021-09-07 ENCOUNTER — Encounter (HOSPITAL_BASED_OUTPATIENT_CLINIC_OR_DEPARTMENT_OTHER): Payer: Medicaid Other | Admitting: Internal Medicine

## 2021-09-14 ENCOUNTER — Other Ambulatory Visit: Payer: Self-pay | Admitting: Sports Medicine

## 2021-09-14 DIAGNOSIS — Z1231 Encounter for screening mammogram for malignant neoplasm of breast: Secondary | ICD-10-CM

## 2021-09-15 ENCOUNTER — Encounter: Payer: Medicaid Other | Admitting: Physician Assistant

## 2021-09-17 ENCOUNTER — Other Ambulatory Visit: Payer: Self-pay | Admitting: Anesthesiology

## 2021-09-17 ENCOUNTER — Ambulatory Visit
Admission: RE | Admit: 2021-09-17 | Discharge: 2021-09-17 | Disposition: A | Discharge status: Home / Self Care | Payer: Medicaid Other | Source: Ambulatory Visit | Attending: Anesthesiology | Admitting: Anesthesiology

## 2021-09-17 DIAGNOSIS — M5136 Other intervertebral disc degeneration, lumbar region: Secondary | ICD-10-CM

## 2021-09-21 ENCOUNTER — Ambulatory Visit
Admission: RE | Admit: 2021-09-21 | Discharge: 2021-09-21 | Disposition: A | Discharge status: Home / Self Care | Payer: Medicaid Other | Source: Ambulatory Visit | Attending: Sports Medicine | Admitting: Sports Medicine

## 2021-09-21 DIAGNOSIS — Z1231 Encounter for screening mammogram for malignant neoplasm of breast: Secondary | ICD-10-CM

## 2021-09-30 LAB — TSH: TSH: 3.11 u[IU]/mL (ref 0.450–4.500)

## 2021-09-30 LAB — T4, FREE: Free T4: 1.73 ng/dL (ref 0.82–1.77)

## 2021-10-15 NOTE — Progress Notes (Signed)
?[facility] Note ? ?10/19/2021 ? ?  ? ?CHIEF COMPLAINT ?Patient presents for Diabetic Eye Exam ? ? ?HISTORY OF PRESENT ILLNESS: ?Candace Robinson is a 56 y.o. female who presents to the clinic today for:  ? ?HPI   ? ? Diabetic Eye Exam   ?Vision is stable.  Associated Symptoms Floaters.  Diabetes characteristics include Type 2 and controlled with diet.  This started 15 years ago.  Blood sugar level is controlled.  Last Blood Glucose: Does not check.  Last A1C 5.2 (08/25/21).  Associated Diagnosis Kidney Disease (Hx kidney injury from heavy use of Naproxen).  I, the attending physician,  performed the HPI with the patient and updated documentation appropriately. ? ?  ?  ? ? Comments   ?Patient denies noticing any vision changes at this time. She is being referred by Dr. Marlon Pel for a diabetic exam ? ?  ?  ?Last edited by Bernarda Caffey, MD on 10/19/2021 10:17 PM.  ?  ?Pt is here on the referral of her PCP, Dr. Marlon Pel, her a DM exam, pt states she was dx pre-diabetic "many years ago" and was put on metformin bc diabetes runs on her mom's side of the family, she states she has not taken anything in the past 8-9 months, she states she manages the diabetes with diet and exercise, pt has cutaneous discoid lupus, she is on po Prednisone '5mg'$  BID and '500mg'$  BID Cellcept for wounds on her feet, she was dx with Graves Disease in October 2021, she had thyroid storm in February 2022, she was hospitalized for 3 weeks at that time ? ?Referring physician: ?[name], MD ?[address] ?[place],  [place] [address] ? ?HISTORICAL INFORMATION:  ? ?Selected notes from the [facility] ?Referred by Dr. [name] for DM exam ?LEE:  ?Ocular Hx- ?PMH- ?  ? ?CURRENT MEDICATIONS: ?No current outpatient medications on file. (Ophthalmic Drugs)  ? ?No current facility-administered medications for this visit. (Ophthalmic Drugs)  ? ?Current Outpatient Medications (Other)  ?Medication Sig  ?  amLODipine (NORVASC) 10 MG tablet Take 10 mg by mouth daily.  ? ASPIRIN LOW DOSE 81 MG EC tablet Take 81 mg by mouth daily.  ? atorvastatin (LIPITOR) 20 MG tablet Take 20 mg by mouth daily.  ? COLISTIMETHATE SODIUM, CBA, IJ 300 mg See admin instructions. Add contents of 2 vials (300 mg) and 1 capsule in the mixing bottle 10 pumps of Bassa Gel; mix well and apply to affected area daily  ? ferrous sulfate 325 (65 FE) MG tablet Take 1 tablet (325 mg total) by mouth in the morning and at bedtime.  ? hydrOXYzine (ATARAX/VISTARIL) 25 MG tablet Take 25 mg by mouth every 8 (eight) hours as needed for itching.  ? levothyroxine (SYNTHROID) 75 MCG tablet Take 75 mcg by mouth every morning.  ? lisinopril (ZESTRIL) 10 MG tablet Take 10 mg by mouth daily.  ? Multiple Vitamins-Minerals (CENTRUM SILVER 50+WOMEN) TABS Take 1 tablet by mouth daily.  ? mycophenolate (CELLCEPT) 500 MG tablet Take 500 mg by mouth 2 (two) times daily.  ? NONFORMULARY OR COMPOUNDED ITEM Apply 1 application topically daily. Compunded med for wound Bassa gel with Gentamycin  ? omeprazole (PRILOSEC) 20 MG capsule Take 20 mg by mouth 2 (two) times daily.  ? ondansetron (ZOFRAN) 4 MG tablet Take 4 mg by mouth every 8 (eight) hours as needed for vomiting or nausea.  ? ondansetron (ZOFRAN-ODT) 4 MG disintegrating tablet Take  1 tablet (4 mg total) by mouth every 8 (eight) hours as needed for nausea or vomiting.  ? prednisoLONE 5 MG TABS tablet Take 5 mg by mouth 2 (two) times daily.  ? tacrolimus (PROTOPIC) 0.1 % ointment Apply 1 application topically daily as needed (lupus skin irritation).  ? traMADol HCl 100 MG TABS Take 100 mg by mouth every 8 (eight) hours as needed (pain).  ? zinc gluconate 50 MG tablet Take 50 mg by mouth daily.  ? acetaminophen (TYLENOL) 325 MG tablet Take 2 tablets (650 mg total) by mouth every 6 (six) hours. (Patient not taking: Reported on 10/19/2021)  ? acetaminophen (TYLENOL) 500 MG tablet Take 1,000 mg by mouth every 6 (six) hours as  needed for moderate pain. (Patient not taking: Reported on 10/19/2021)  ? ?No current facility-administered medications for this visit. (Other)  ? ?REVIEW OF SYSTEMS: ?ROS   ?Positive for: Musculoskeletal, Endocrine, Eyes ?Last edited by Annie Paras, COT on 10/19/2021  1:51 PM.  ?  ? ?ALLERGIES ?Allergies  ?Allergen Reactions  ? Motrin [Ibuprofen] Other (See Comments)  ?  hallucinations  ? ?PAST MEDICAL HISTORY ?Past Medical History:  ?Diagnosis Date  ? Anemia   ? Atrial fibrillation ([place])   ? in setting of hyperthyroidism  ? CHF (congestive heart failure) ([place])   ? Diabetes mellitus without complication ([place])   ? Heart murmur   ? mild AS 02/15/21 echo [facility])  ? Hypertension   ? Hyperthyroidism   ? throtoxicosis 08/2020; s/p RAI therapy 12/11/20 for Graves' Disease  ? Hypothyroidism   ? post-RAI therapy  ? Lupus ([place])   ? cutaneous discoid lupus  ? ?Past Surgical History:  ?Procedure Laterality Date  ? ABLATION Right   ? approximately 2015. right lower leg  ? TONSILLECTOMY    ? age 41  ? ?FAMILY HISTORY ?Family History  ?Problem Relation Age of Onset  ? Hypertension Mother   ? Diabetes Mother   ? COPD Mother   ? Glaucoma Mother   ? Cancer - Colon Father   ? Diabetes Sister   ? Glaucoma Sister   ? Uveitis Daughter   ? Lupus Cousin   ?     systemic  ? Lupus Cousin   ?     systemic  ? Lupus Cousin   ?     cutaneous  ? ?SOCIAL HISTORY ?Social History  ? ?Tobacco Use  ? Smoking status: Never  ? Smokeless tobacco: Never  ?Vaping Use  ? Vaping Use: Never used  ?Substance Use Topics  ? Alcohol use: Not Currently  ? Drug use: Not Currently  ?  ? ?  ?OPHTHALMIC EXAM: ? ?Base Eye Exam   ? ? Visual Acuity (Snellen - Linear)   ? ?   Right Left  ? Dist [place] 20/20 -2 20/20  ? ?  ?  ? ? Tonometry (Tonopen, 2:00 PM)   ? ?   Right Left  ? Pressure 17 18  ? ?  ?  ? ? Pupils   ? ?   Dark Light Shape React APD  ? Right 3 2 Round Sluggish None  ? Left 3 2 Round Sluggish None  ? ?  ?  ? ? Visual Fields   ? ?   Left Right  ?  Full Full   ? ?  ?  ? ? Extraocular Movement   ? ?   Right Left  ?  Full Full  ? ?  ?  ? ? Neuro/Psych   ? ?  Oriented x3: Yes  ? ?  ?  ? ? Dilation   ? ? Both eyes: 1.0% Mydriacyl, 2.5% Phenylephrine @ 1:55 PM  ? ?  ?  ? ?  ? ?Slit Lamp and Fundus Exam   ? ? Slit Lamp Exam   ? ?   Right Left  ? Lids/Lashes Normal Normal  ? Conjunctiva/Sclera mild melanosis mild melanosis  ? Cornea Clear Clear  ? Anterior Chamber Deep and quiet Deep and quiet  ? Iris Round and dilated, No NVI Round and dilated, No NVI  ? Lens 1-2+ Nuclear sclerosis, 2+ Cortical cataract 1-2+ Nuclear sclerosis, 2+ Cortical cataract  ? Anterior Vitreous clear clear  ? ?  ?  ? ? Fundus Exam   ? ?   Right Left  ? Disc Pink and Sharp Pink and Sharp  ? C/D Ratio 0.5 0.5  ? Macula Flat, Good foveal reflex, No heme or edema Flat, Good foveal reflex, midl RPE mottling, No heme or edema  ? Vessels attenuated, copper wiring, mild tortuosity attenuated, copper wiring, mild tortuosity  ? Periphery Attached Attached, punctate IRH at 0900, rare MA  ? ?  ?  ? ?  ? ?IMAGING AND PROCEDURES  ?Imaging and Procedures for 10/19/2021 ? ?OCT, Retina - OU - Both Eyes   ? ?   ?Right Eye ?Quality was good. Central Foveal Thickness: 247. Progression has no prior data. Findings include normal foveal contour, no IRF, no SRF, vitreomacular adhesion .  ? ?Left Eye ?Quality was good. Central Foveal Thickness: 279. Progression has no prior data. Findings include no IRF, normal foveal contour, no SRF, vitreomacular adhesion .  ? ?Notes ?*Images captured and stored on drive ? ?Diagnosis / Impression:  ?NFP, no IRF/SRF OU ?No DME OU ? ?Clinical management:  ?See below ? ?Abbreviations: NFP - Normal foveal profile. CME - cystoid macular edema. PED - pigment epithelial detachment. IRF - intraretinal fluid. SRF - subretinal fluid. EZ - ellipsoid zone. ERM - epiretinal membrane. ORA - outer retinal atrophy. ORT - outer retinal tubulation. SRHM - subretinal hyper-reflective material. IRHM - intraretinal  hyper-reflective material ? ? ?  ?  ?  ? ?  ?ASSESSMENT/PLAN: ? ?  ICD-10-CM   ?1. Diabetes mellitus type 2 without retinopathy ([place])  E11.9 OCT, Retina - OU - Both Eyes  ?  ?2. Essential hypertension  I10

## 2021-10-19 ENCOUNTER — Encounter (INDEPENDENT_AMBULATORY_CARE_PROVIDER_SITE_OTHER): Payer: Self-pay | Admitting: Ophthalmology

## 2021-10-19 ENCOUNTER — Ambulatory Visit (INDEPENDENT_AMBULATORY_CARE_PROVIDER_SITE_OTHER): Payer: Medicaid Other | Admitting: Ophthalmology

## 2021-10-19 DIAGNOSIS — H25813 Combined forms of age-related cataract, bilateral: Secondary | ICD-10-CM | POA: Diagnosis not present

## 2021-10-19 DIAGNOSIS — E119 Type 2 diabetes mellitus without complications: Secondary | ICD-10-CM

## 2021-10-19 DIAGNOSIS — H35033 Hypertensive retinopathy, bilateral: Secondary | ICD-10-CM | POA: Diagnosis not present

## 2021-10-19 DIAGNOSIS — I10 Essential (primary) hypertension: Secondary | ICD-10-CM

## 2021-10-19 DIAGNOSIS — H3581 Retinal edema: Secondary | ICD-10-CM

## 2021-10-20 ENCOUNTER — Encounter: Payer: Self-pay | Admitting: Nurse Practitioner

## 2021-10-20 ENCOUNTER — Telehealth (INDEPENDENT_AMBULATORY_CARE_PROVIDER_SITE_OTHER): Payer: Medicaid Other | Admitting: Nurse Practitioner

## 2021-10-20 VITALS — Ht 63.0 in | Wt 155.4 lb

## 2021-10-20 DIAGNOSIS — E890 Postprocedural hypothyroidism: Secondary | ICD-10-CM | POA: Diagnosis not present

## 2021-10-20 MED ORDER — LEVOTHYROXINE SODIUM 75 MCG PO TABS: 75.0000 ug | ORAL_TABLET | Freq: Every day | ORAL | 1 refills | Status: DC

## 2021-10-20 NOTE — Progress Notes (Signed)
? ? ? 10/20/2021   ? ? ?Endocrinology Follow Up Note  ? ? ? ?TELEHEALTH VISIT: The patient is being engaged in telehealth visit due to COVID-19.  This type of visit limits physical examination significantly, and thus is not preferable over face-to-face encounters. ? ?I connected with  [name] on 10/20/21 by a video enabled telemedicine application and verified that I am speaking with the correct person using two identifiers. ?  ?I discussed the limitations of evaluation and management by telemedicine. The patient expressed understanding and agreed to proceed.  ? ? ?The participants involved in this visit include: Brita Romp, NP located at [facility] and Cadie Sorci [place]  located at their personal residence listed. ? ? ?Subjective:  ? ? Patient ID: Candace Robinson, female    DOB: 04-28-1966, PCP [name], MD. ? ? ?Past Medical History:  ?Diagnosis Date  ? Anemia   ? Atrial fibrillation ([place])   ? in setting of hyperthyroidism  ? CHF (congestive heart failure) ([place])   ? Diabetes mellitus without complication ([place])   ? Heart murmur   ? mild AS 02/15/21 echo [facility])  ? Hypertension   ? Hyperthyroidism   ? throtoxicosis 08/2020; s/p RAI therapy 12/11/20 for Graves' Disease  ? Hypothyroidism   ? post-RAI therapy  ? Lupus ([place])   ? cutaneous discoid lupus  ? ? ?Past Surgical History:  ?Procedure Laterality Date  ? ABLATION Right   ? approximately 2015. right lower leg  ? TONSILLECTOMY    ? age 10  ? ? ?Social History  ? ?Socioeconomic History  ? Marital status: Single  ?  Spouse name: Not on file  ? Number of children: Not on file  ? Years of education: Not on file  ? Highest education level: Not on file  ?Occupational History  ? Not on file  ?Tobacco Use  ? Smoking status: Never  ? Smokeless tobacco: Never  ?Vaping Use  ? Vaping Use: Never used  ?Substance and Sexual Activity  ? Alcohol use: Not Currently  ? Drug use: Not Currently  ? Sexual activity: Not on file   ?Other Topics Concern  ? Not on file  ?Social History Narrative  ? Not on file  ? ?Social Determinants of Health  ? ?Financial Resource Strain: Not on file  ?Food Insecurity: Not on file  ?Transportation Needs: Not on file  ?Physical Activity: Not on file  ?Stress: Not on file  ?Social Connections: Not on file  ? ? ?Family History  ?Problem Relation Age of Onset  ? Hypertension Mother   ? Diabetes Mother   ? COPD Mother   ? Glaucoma Mother   ? Cancer - Colon Father   ? Diabetes Sister   ? Glaucoma Sister   ? Uveitis Daughter   ? Lupus Cousin   ?     systemic  ? Lupus Cousin   ?     systemic  ? Lupus Cousin   ?     cutaneous  ? ? ?Outpatient Encounter Medications as of 10/20/2021  ?Medication Sig  ? acetaminophen (TYLENOL) 325 MG tablet Take 2 tablets (650 mg total) by mouth every 6 (six) hours.  ? acetaminophen (TYLENOL) 500 MG tablet Take 1,000 mg by mouth every 6 (six) hours as needed for moderate pain.  ? amLODipine (NORVASC) 5 MG tablet Take 5 mg by mouth daily.  ? ASPIRIN LOW DOSE 81 MG EC tablet Take 81 mg by mouth daily.  ? atorvastatin (  LIPITOR) 20 MG tablet Take 20 mg by mouth daily.  ? ferrous sulfate 325 (65 FE) MG tablet Take 1 tablet (325 mg total) by mouth in the morning and at bedtime.  ? hydrOXYzine (ATARAX/VISTARIL) 25 MG tablet Take 25 mg by mouth every 8 (eight) hours as needed for itching.  ? lisinopril (ZESTRIL) 10 MG tablet Take 10 mg by mouth daily.  ? Multiple Vitamins-Minerals (CENTRUM SILVER 50+WOMEN) TABS Take 1 tablet by mouth daily.  ? mycophenolate (CELLCEPT) 500 MG tablet Take 500 mg by mouth 2 (two) times daily.  ? omeprazole (PRILOSEC) 20 MG capsule Take 20 mg by mouth 2 (two) times daily.  ? ondansetron (ZOFRAN) 4 MG tablet Take 4 mg by mouth every 8 (eight) hours as needed for vomiting or nausea.  ? ondansetron (ZOFRAN-ODT) 4 MG disintegrating tablet Take 1 tablet (4 mg total) by mouth every 8 (eight) hours as needed for nausea or vomiting.  ? predniSONE (DELTASONE) 5 MG tablet  Take 10 mg by mouth daily.  ? tacrolimus (PROTOPIC) 0.1 % ointment Apply 1 application topically daily as needed (lupus skin irritation).  ? traMADol HCl 100 MG TABS Take 100 mg by mouth every 8 (eight) hours as needed (pain).  ? zinc gluconate 50 MG tablet Take 50 mg by mouth daily.  ? [DISCONTINUED] levothyroxine (SYNTHROID) 75 MCG tablet Take 75 mcg by mouth every morning.  ? COLISTIMETHATE SODIUM, CBA, IJ 300 mg See admin instructions. Add contents of 2 vials (300 mg) and 1 capsule in the mixing bottle 10 pumps of Bassa Gel; mix well and apply to affected area daily (Patient not taking: Reported on 10/20/2021)  ? levothyroxine (SYNTHROID) 75 MCG tablet Take 1 tablet (75 mcg total) by mouth daily before breakfast.  ? NONFORMULARY OR COMPOUNDED ITEM Apply 1 application topically daily. Compunded med for wound Bassa gel with Gentamycin (Patient not taking: Reported on 10/20/2021)  ? [DISCONTINUED] amLODipine (NORVASC) 10 MG tablet Take 10 mg by mouth daily. (Patient not taking: Reported on 10/20/2021)  ? [DISCONTINUED] prednisoLONE 5 MG TABS tablet Take 5 mg by mouth 2 (two) times daily. (Patient not taking: Reported on 10/20/2021)  ? ?No facility-administered encounter medications on file as of 10/20/2021.  ? ? ?ALLERGIES: ?Allergies  ?Allergen Reactions  ? Ibuprofen Other (See Comments)  ?  hallucinations ?Other reaction(s): Delusions (intolerance), Other (See Comments) ?hallucinations  ? ? ?VACCINATION STATUS: ?Immunization History  ?Administered Date(s) Administered  ? PFIZER(Purple Top)SARS-COV-2 Vaccination 09/12/2019, 06/30/2020  ? ? ? ?HPI ? ?Candace Robinson is 56 y.o. female who presents today with a medical history as above. she is being seen in follow up after being seen in consultation for hyperthyroidism requested by [name], MD.  she was recently hospitalized for complications with nonhealing wounds on her bilateral feet.  She developed altered mental status and thus thyroid labs were  performed showing severely suppressed TSH and high Free T3 levels, in impending thyroid storm.  She was treated with high dose of Methimazole 30 mg po daily and sent to the [facility] for rehabilitation where she has since been sent home.  She also had baseline thyroid ultrasound which did not show any suspicious nodules. ? ?she currently denies dysphagia, choking, shortness of breath, no recent voice change.  ?  ?she denies family history of thyroid dysfunction and denies family hx of thyroid cancer. she denies personal history of goiter. she is on any anti-thyroid medications and propanolol for symptom management. Denies use of Biotin containing supplements.   ? ?  Once her thyroid labs stabilized, we were able to taper and stop her Methimazole and proceed with NM uptake and scan which confirmed Graves disease.  She is s/p RAI therapy on 12/11/20.  She has since started on thyroid hormone replacement therapy. ? ? ?Review of systems ? ?Constitutional: + Minimally fluctuating body weight,  current Body mass index is 27.53 kg/m?. , no fatigue, no subjective hyperthermia, no subjective hypothermia ?Eyes: no blurry vision, no xerophthalmia ?ENT: no sore throat, no nodules palpated in throat, no dysphagia/odynophagia, no hoarseness ?Cardiovascular: no chest pain, no shortness of breath, no palpitations, no leg swelling ?Respiratory: no cough, no shortness of breath ?Gastrointestinal: no nausea/vomiting/diarrhea ?Musculoskeletal: no muscle/joint aches ?Skin: no rashes, no hyperemia ?Neurological: no tremors, no numbness, no tingling, no dizziness ?Psychiatric: no depression, no anxiety ? ? ?Objective:  ?  ?Ht '5\' 3"'$  (1.6 m)   Wt 155 lb 6.4 oz (70.5 kg)   BMI 27.53 kg/m?   ?Wt Readings from Last 3 Encounters:  ?10/20/21 155 lb 6.4 oz (70.5 kg)  ?08/26/21 161 lb 1.6 oz (73.1 kg)  ?08/25/21 161 lb 12.8 oz (73.4 kg)  ?  ? ?BP Readings from Last 3 Encounters:  ?08/26/21 114/74  ?08/25/21 114/73  ?08/20/21 131/80  ? ? ?Physical  Exam- Telehealth- significantly limited due to nature of visit ? ?Constitutional: Body mass index is 27.53 kg/m?. , not in acute distress, normal state of mind ?Respiratory: Adequate breathing efforts ? ? ?CMP  ?

## 2021-10-20 NOTE — Patient Instructions (Signed)

## 2021-11-18 ENCOUNTER — Encounter: Payer: Self-pay | Admitting: Gastroenterology

## 2021-11-22 ENCOUNTER — Telehealth: Payer: Self-pay | Admitting: *Deleted

## 2021-11-22 NOTE — Telephone Encounter (Signed)
Please review pt. History and advise if ok for [facility] ? Thank you.

## 2021-11-23 NOTE — Telephone Encounter (Signed)
Noted  

## 2021-11-23 NOTE — Telephone Encounter (Signed)
Sheila,  This pt is cleared for anesthetic care at [facility].  Thanks,  Estela Vinal 

## 2021-11-24 ENCOUNTER — Ambulatory Visit (AMBULATORY_SURGERY_CENTER): Payer: Self-pay | Admitting: *Deleted

## 2021-11-24 VITALS — Ht 63.0 in | Wt 173.0 lb

## 2021-11-24 DIAGNOSIS — Z800 Family history of malignant neoplasm of digestive organs: Secondary | ICD-10-CM

## 2021-11-24 MED ORDER — NA SULFATE-K SULFATE-MG SULF 17.5-3.13-1.6 GM/177ML PO SOLN: 1.0000 | ORAL | 0 refills | Status: DC

## 2021-11-24 NOTE — Progress Notes (Signed)
Patient is here in-person for PV. Patient denies any allergies to eggs or soy. Patient denies any problems with anesthesia/sedation. Patient is not on any oxygen at home. Patient is not taking any diet/weight loss medications or blood thinners. Patient is aware of our care-partner policy and [id number] safety protocol. Pt uses a walker.  EMMI education assigned to the patient for the procedure, sent to [facility].   Patient is COVID-19 vaccinated.

## 2021-12-01 ENCOUNTER — Telehealth: Payer: Self-pay | Admitting: Nurse Practitioner

## 2021-12-01 NOTE — Telephone Encounter (Signed)
Received medical records request from [facility]. Scanned to [facility] Medical records.

## 2021-12-22 ENCOUNTER — Encounter: Payer: Medicaid Other | Admitting: Gastroenterology

## 2022-01-19 ENCOUNTER — Encounter: Payer: Self-pay | Admitting: Gastroenterology

## 2022-01-19 ENCOUNTER — Ambulatory Visit (AMBULATORY_SURGERY_CENTER): Payer: Medicaid Other | Admitting: Gastroenterology

## 2022-01-19 VITALS — BP 126/75 | HR 74 | Temp 97.7°F | Resp 17 | Ht 63.0 in | Wt 173.0 lb

## 2022-01-19 DIAGNOSIS — Z1211 Encounter for screening for malignant neoplasm of colon: Secondary | ICD-10-CM | POA: Diagnosis not present

## 2022-01-19 DIAGNOSIS — Z800 Family history of malignant neoplasm of digestive organs: Secondary | ICD-10-CM | POA: Diagnosis not present

## 2022-01-19 DIAGNOSIS — D120 Benign neoplasm of cecum: Secondary | ICD-10-CM

## 2022-01-19 MED ORDER — SODIUM CHLORIDE 0.9 % IV SOLN: 500.0000 mL | Freq: Once | INTRAVENOUS | Status: DC

## 2022-01-19 NOTE — Progress Notes (Signed)
Problems with getting sat probe to pick up consistently. Placed on ear with heat pack, for best reading. To pacu, VSS. Report to Rn.tb

## 2022-01-19 NOTE — Progress Notes (Signed)
GASTROENTEROLOGY PROCEDURE H&P NOTE   Primary Care Physician: [name], MD  HPI: Candace Robinson is a 56 y.o. female who presents for colonoscopy for screening (family history colon cancer and personal history of pyoderma).  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 right lower leg   COLONOSCOPY  09/18/2015   in [place]-normal exam per pt   FOOT SURGERY  04/2020   debridement   TONSILLECTOMY     age 42   Current Outpatient Medications  Medication Sig Dispense Refill   amLODipine (NORVASC) 5 MG tablet Take 5 mg by mouth daily.     ASPIRIN LOW DOSE 81 MG EC tablet Take 81 mg by mouth daily.     atorvastatin (LIPITOR) 20 MG tablet Take 20 mg by mouth daily.     ferrous sulfate 325 (65 FE) MG tablet Take 1 tablet (325 mg total) by mouth in the morning and at bedtime. 60 tablet 0   hydrOXYzine (ATARAX/VISTARIL) 25 MG tablet Take 25 mg by mouth every 8 (eight) hours as needed for itching.     levothyroxine (SYNTHROID) 75 MCG tablet Take 1 tablet (75 mcg total) by mouth daily before breakfast. 90 tablet 1   lisinopril (ZESTRIL) 10 MG tablet Take 10 mg by mouth daily.     Multiple Vitamins-Minerals (CENTRUM SILVER 50+WOMEN) TABS Take 1 tablet by mouth daily.     mycophenolate (CELLCEPT) 500 MG tablet Take 500 mg by mouth 2 (two) times daily.     Na Sulfate-K Sulfate-Mg Sulf 17.5-3.13-1.6 GM/177ML SOLN Take 1 kit by mouth as directed. May use generic SUPREP;NO prior authorizations will be done.Please use Singlecare or  GOOD-RX coupon. 354 mL 0   omeprazole (PRILOSEC) 20 MG capsule Take 20 mg by mouth 2 (two) times daily.     ondansetron (ZOFRAN) 4 MG tablet Take 4 mg by mouth every 8 (eight) hours as needed for vomiting or nausea.     ondansetron (ZOFRAN-ODT) 4 MG disintegrating tablet Take 1 tablet (4 mg total) by mouth every 8 (eight) hours as needed for nausea or vomiting. 20 tablet 0   predniSONE (DELTASONE) 5 MG tablet Take 10 mg by mouth daily.     tacrolimus (PROTOPIC) 0.1 % ointment Apply 1 application topically daily as needed (lupus skin irritation).     traMADol HCl 100 MG TABS Take 100 mg by mouth every 8 (eight) hours as needed (pain).     zinc gluconate 50 MG tablet Take 50 mg by mouth daily.     No current facility-administered medications for this visit.    Current Outpatient Medications:    amLODipine (NORVASC) 5 MG tablet, Take 5 mg by mouth daily., Disp: , Rfl:    ASPIRIN LOW DOSE 81 MG EC tablet, Take 81 mg by mouth daily., Disp: , Rfl:    atorvastatin (LIPITOR) 20 MG tablet, Take 20 mg by mouth daily., Disp: , Rfl:    ferrous sulfate 325 (65 FE)  MG tablet, Take 1 tablet (325 mg total) by mouth in the morning and at bedtime., Disp: 60 tablet, Rfl: 0   hydrOXYzine (ATARAX/VISTARIL) 25 MG tablet, Take 25 mg by mouth every 8 (eight) hours as needed for itching., Disp: , Rfl:    levothyroxine (SYNTHROID) 75 MCG tablet, Take 1 tablet (75 mcg total) by mouth daily before breakfast., Disp: 90 tablet, Rfl: 1   lisinopril (ZESTRIL) 10 MG tablet, Take 10 mg by mouth daily., Disp: , Rfl:    Multiple Vitamins-Minerals (CENTRUM SILVER 50+WOMEN) TABS, Take 1 tablet by mouth daily., Disp: , Rfl:    mycophenolate (CELLCEPT) 500 MG tablet, Take 500 mg by mouth 2 (two) times daily., Disp: , Rfl:    Na Sulfate-K Sulfate-Mg Sulf 17.5-3.13-1.6 GM/177ML SOLN, Take 1 kit by mouth as directed. May use generic SUPREP;NO prior authorizations will be done.Please use Singlecare or GOOD-RX coupon., Disp: 354 mL, Rfl:  0   omeprazole (PRILOSEC) 20 MG capsule, Take 20 mg by mouth 2 (two) times daily., Disp: , Rfl:    ondansetron (ZOFRAN) 4 MG tablet, Take 4 mg by mouth every 8 (eight) hours as needed for vomiting or nausea., Disp: , Rfl:    ondansetron (ZOFRAN-ODT) 4 MG disintegrating tablet, Take 1 tablet (4 mg total) by mouth every 8 (eight) hours as needed for nausea or vomiting., Disp: 20 tablet, Rfl: 0   predniSONE (DELTASONE) 5 MG tablet, Take 10 mg by mouth daily., Disp: , Rfl:    tacrolimus (PROTOPIC) 0.1 % ointment, Apply 1 application topically daily as needed (lupus skin irritation)., Disp: , Rfl:    traMADol HCl 100 MG TABS, Take 100 mg by mouth every 8 (eight) hours as needed (pain)., Disp: , Rfl:    zinc gluconate 50 MG tablet, Take 50 mg by mouth daily., Disp: , Rfl:  Allergies  Allergen Reactions   Ibuprofen Other (See Comments)    hallucinations Other reaction(s): Delusions (intolerance), Other (See Comments) hallucinations   Family History  Problem Relation Age of Onset   Hypertension Mother    Diabetes Mother    COPD Mother    Glaucoma Mother    Colon cancer Father        unsure age "around 60" perp t   Cancer - Colon Father    Diabetes Sister    Glaucoma Sister    Uveitis Daughter    Lupus Cousin        systemic   Lupus Cousin        systemic   Lupus Cousin        cutaneous   Stomach cancer Maternal Great-grandmother    Colon polyps Neg Hx    Esophageal cancer Neg Hx    Rectal cancer Neg Hx    Social History   Socioeconomic History   Marital status: Single    Spouse name: Not on file   Number of children: Not on file   Years of education: Not on file   Highest education level: Not on file  Occupational History   Not on file  Tobacco Use   Smoking status: Never   Smokeless tobacco: Never  Vaping Use   Vaping Use: Never used  Substance and Sexual Activity   Alcohol use: Not Currently   Drug use: Not Currently   Sexual activity: Not on file  Other Topics  Concern   Not on file  Social History Narrative   Not on file   Social Determinants of Health   Financial Resource Strain: Not  on file  Food Insecurity: Not on file  Transportation Needs: Not on file  Physical Activity: Not on file  Stress: Not on file  Social Connections: Not on file  Intimate Partner Violence: Not on file    Physical Exam: There were no vitals filed for this visit. There is no height or weight on file to calculate BMI. GEN: NAD EYE: Sclerae anicteric ENT: MMM CV: Non-tachycardic GI: Soft, NT/ND NEURO:  Alert & Oriented x 3  Lab Results: No results for input(s): "WBC", "HGB", "HCT", "PLT" in the last 72 hours. BMET No results for input(s): "NA", "K", "CL", "CO2", "GLUCOSE", "BUN", "CREATININE", "CALCIUM" in the last 72 hours. LFT No results for input(s): "PROT", "ALBUMIN", "AST", "ALT", "ALKPHOS", "BILITOT", "BILIDIR", "IBILI" in the last 72 hours. PT/INR No results for input(s): "LABPROT", "INR" in the last 72 hours.   Impression / Plan: This is a 56 y.o.female who presents for colonoscopy for screening (family history colon cancer and personal history of pyoderma).  The risks and benefits of endoscopic evaluation/treatment were discussed with the patient and/or family; these include but are not limited to the risk of perforation, infection, bleeding, missed lesions, lack of diagnosis, severe illness requiring hospitalization, as well as anesthesia and sedation related illnesses.  The patient's history has been reviewed, patient examined, no change in status, and deemed stable for procedure.  The patient and/or family is agreeable to proceed.    Justice Britain, MD [facility] Advanced [facility]oscopy Office # [phone]

## 2022-01-19 NOTE — Patient Instructions (Signed)
Please read handouts provided. Continue present medications. Await pathology results. High Fiber Diet. Repeat colonoscopy in 5 years for screening. Use FiberCon 1-2 tablets daily.   YOU HAD AN ENDOSCOPIC PROCEDURE TODAY AT [facility]:   Refer to the procedure report that was given to you for any specific questions about what was found during the examination.  If the procedure report does not answer your questions, please call your gastroenterologist to clarify.  If you requested that your care partner not be given the details of your procedure findings, then the procedure report has been included in a sealed envelope for you to review at your convenience later.  YOU SHOULD EXPECT: Some feelings of bloating in the abdomen. Passage of more gas than usual.  Walking can help get rid of the air that was put into your GI tract during the procedure and reduce the bloating. If you had a lower endoscopy (such as a colonoscopy or flexible sigmoidoscopy) you may notice spotting of blood in your stool or on the toilet paper. If you underwent a bowel prep for your procedure, you may not have a normal bowel movement for a few days.  Please Note:  You might notice some irritation and congestion in your nose or some drainage.  This is from the oxygen used during your procedure.  There is no need for concern and it should clear up in a day or so.  SYMPTOMS TO REPORT IMMEDIATELY:  Following lower endoscopy (colonoscopy or flexible sigmoidoscopy):  Excessive amounts of blood in the stool  Significant tenderness or worsening of abdominal pains  Swelling of the abdomen that is new, acute  Fever of 100F or higher   For urgent or emergent issues, a gastroenterologist can be reached at any hour by calling [phone]. Do not use MyChart messaging for urgent concerns.    DIET:  We do recommend a small meal at first, but then you may proceed to your regular diet.  Drink plenty of fluids but  you should avoid alcoholic beverages for 24 hours.  ACTIVITY:  You should plan to take it easy for the rest of today and you should NOT DRIVE or use heavy machinery until tomorrow (because of the sedation medicines used during the test).    FOLLOW UP: Our staff will call the number listed on your records the next business day following your procedure.  We will call around 7:15- 8:00 am to check on you and address any questions or concerns that you may have regarding the information given to you following your procedure. If we do not reach you, we will leave a message.  If you develop any symptoms (ie: fever, flu-like symptoms, shortness of breath, cough etc.) before then, please call [phone].  If you test positive for Covid 19 in the 2 weeks post procedure, please call and report this information to [place].    If any biopsies were taken you will be contacted by phone or by letter within the next 1-3 weeks.  Please call us at [phone] if you have not heard about the biopsies in 3 weeks.    SIGNATURES/CONFIDENTIALITY: You and/or your care partner have signed paperwork which will be entered into your electronic medical record.  These signatures attest to the fact that that the information above on your After Visit Summary has been reviewed and is understood.  Full responsibility of the confidentiality of this discharge information lies with you and/or your care-partner.

## 2022-01-19 NOTE — Progress Notes (Signed)
Called to room to assist during endoscopic procedure.  Patient ID and intended procedure confirmed with present staff. Received instructions for my participation in the procedure from the performing physician.  

## 2022-01-19 NOTE — Op Note (Signed)
[facility] Patient Name: Candace Robinson Procedure Date: 01/19/2022 1:58 PM MRN: [id number] Endoscopist: Justice Britain , MD Age: 56 Referring MD:  Date of Birth: 26-Feb-1966 Gender: Female Account #: [id number] Procedure:                Colonoscopy Indications:              Screening in patient at increased risk: Colorectal                            cancer in father before age 66 Medicines:                Monitored Anesthesia Care Procedure:                Pre-Anesthesia Assessment:                           - Prior to the procedure, a History and Physical                            was performed, and patient medications and                            allergies were reviewed. The patient's tolerance of                            previous anesthesia was also reviewed. The risks                            and benefits of the procedure and the sedation                            options and risks were discussed with the patient.                            All questions were answered, and informed consent                            was obtained. Prior Anticoagulants: The patient has                            taken no previous anticoagulant or antiplatelet                            agents except for aspirin. ASA Grade Assessment: II                            - A patient with mild systemic disease. After                            reviewing the risks and benefits, the patient was                            deemed in satisfactory condition to undergo the  procedure.                           After obtaining informed consent, the colonoscope                            was passed under direct vision. Throughout the                            procedure, the patient's blood pressure, pulse, and                            oxygen saturations were monitored continuously. The                            CF [id number] #[id number] was introduced through the anus                             and advanced to the 5 cm into the ileum. The                            colonoscopy was performed without difficulty. The                            patient tolerated the procedure. The quality of the                            bowel preparation was good. The terminal ileum,                            ileocecal valve, appendiceal orifice, and rectum                            were photographed. Scope In: 2:16:21 PM Scope Out: 2:27:25 PM Scope Withdrawal Time: 0 hours 8 minutes 56 seconds  Total Procedure Duration: 0 hours 11 minutes 4 seconds  Findings:                 The digital rectal exam findings include                            hemorrhoids. Pertinent negatives include no                            palpable rectal lesions.                           The terminal ileum and ileocecal valve appeared                            normal.                           A 5 mm polyp was found in the ileocecal valve. The  polyp was sessile. The polyp was removed with a                            cold snare. Resection and retrieval were complete.                           Normal mucosa was found in the entire colon                            otherwise.                           Non-bleeding non-thrombosed internal hemorrhoids                            were found during retroflexion, during perianal                            exam and during digital exam. The hemorrhoids were                            Grade II (internal hemorrhoids that prolapse but                            reduce spontaneously). Complications:            No immediate complications. Estimated Blood Loss:     Estimated blood loss was minimal. Impression:               - Hemorrhoids found on digital rectal exam.                           - The examined portion of the ileum was normal.                           - One 5 mm polyp at the ileocecal valve, removed                             with a cold snare. Resected and retrieved.                           - Normal mucosa in the entire examined colon                            otherwise.                           - Non-bleeding non-thrombosed internal hemorrhoids. Recommendation:           - The patient will be observed post-procedure,                            until all discharge criteria are met.                           - Discharge patient to home.                           -  Patient has a contact number available for                            emergencies. The signs and symptoms of potential                            delayed complications were discussed with the                            patient. Return to normal activities tomorrow.                            Written discharge instructions were provided to the                            patient.                           - High fiber diet.                           - Use FiberCon 1-2 tablets PO daily.                           - Continue present medications.                           - Await pathology results.                           - Repeat colonoscopy in 5 years for                            surveillance/screening no matter pathology due to                            family history.                           - The findings and recommendations were discussed                            with the patient.                           - The findings and recommendations were discussed                            with the designated responsible adult. Justice Britain, MD 01/19/2022 2:30:56 PM

## 2022-01-19 NOTE — Progress Notes (Signed)
Pt's states no medical or surgical changes since previsit or office visit. 

## 2022-01-20 ENCOUNTER — Telehealth: Payer: Self-pay | Admitting: *Deleted

## 2022-01-20 NOTE — Telephone Encounter (Signed)
Attempted to call patient for their post-procedure follow-up call. No answer. Left voicemail.   

## 2022-01-24 ENCOUNTER — Encounter: Payer: Self-pay | Admitting: Gastroenterology

## 2022-02-23 ENCOUNTER — Telehealth: Payer: Medicaid Other | Admitting: Nurse Practitioner

## 2022-03-03 LAB — TSH: TSH: 0.184 u[IU]/mL — ABNORMAL LOW (ref 0.450–4.500)

## 2022-03-03 LAB — T4, FREE: Free T4: 1.53 ng/dL (ref 0.82–1.77)

## 2022-03-09 ENCOUNTER — Telehealth: Payer: Medicaid Other | Admitting: Nurse Practitioner

## 2022-03-15 ENCOUNTER — Encounter: Payer: Self-pay | Admitting: Nurse Practitioner

## 2022-03-15 ENCOUNTER — Telehealth (INDEPENDENT_AMBULATORY_CARE_PROVIDER_SITE_OTHER): Payer: Medicaid Other | Admitting: Nurse Practitioner

## 2022-03-15 VITALS — BP 128/71 | HR 92 | Ht 63.0 in | Wt 165.0 lb

## 2022-03-15 DIAGNOSIS — E890 Postprocedural hypothyroidism: Secondary | ICD-10-CM

## 2022-03-15 MED ORDER — LEVOTHYROXINE SODIUM 75 MCG PO TABS
75.0000 ug | ORAL_TABLET | Freq: Every day | ORAL | 1 refills | Status: DC
Start: 2022-03-15 — End: 2022-09-27

## 2022-03-15 NOTE — Patient Instructions (Signed)

## 2022-03-15 NOTE — Progress Notes (Signed)
03/15/2022     Endocrinology Follow Up Note     TELEHEALTH VISIT: The patient is being engaged in telehealth visit due to COVID-19.  This type of visit limits physical examination significantly, and thus is not preferable over face-to-face encounters.  I connected with  Candace Robinson on 03/15/22 by a video enabled telemedicine application and verified that I am speaking with the correct person using two identifiers.   I discussed the limitations of evaluation and management by telemedicine. The patient expressed understanding and agreed to proceed.   The participants involved in this visit include: Brita Romp, NP located at [facility] and Zyion Leidner Friel  located at their personal residence listed.   Subjective:    Patient ID: Candace Robinson, female    DOB: 1965-09-25, PCP [name], MD.   Past Medical History:  Diagnosis Date   Anemia    Atrial fibrillation ([place])    in setting of hyperthyroidism   CHF (congestive heart failure) ([place])    Graves disease    Heart murmur    mild AS 02/15/21 echo 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 right lower leg   COLONOSCOPY  09/18/2015   in [place]-normal exam per pt   FOOT SURGERY  04/2020   debridement   TONSILLECTOMY     age 35    Social History   Socioeconomic History   Marital status: Single    Spouse name: Not on file   Number of children: Not on file   Years of education: Not on file   Highest education level: Not on file  Occupational History   Not on file  Tobacco Use   Smoking status: Never   Smokeless tobacco: Never  Vaping Use   Vaping Use: Never used  Substance and Sexual Activity   Alcohol use:  Not Currently   Drug use: Not Currently   Sexual activity: Not on file  Other Topics Concern   Not on file  Social History Narrative   Not on file   Social Determinants of Health   Financial Resource Strain: Not on file  Food Insecurity: Not on file  Transportation Needs: Not on file  Physical Activity: Not on file  Stress: Not on file  Social Connections: Not on file    Family History  Problem Relation Age of Onset   Hypertension Mother    Diabetes Mother    COPD Mother    Glaucoma Mother    Colon cancer Father        unsure age "around 71" perp t   Cancer - Colon Father    Diabetes Sister    Glaucoma Sister    Uveitis Daughter    Lupus Cousin        systemic   Lupus Cousin        systemic   Lupus Cousin        cutaneous   Stomach cancer Maternal Great-grandmother    Colon polyps Neg Hx    Esophageal cancer Neg Hx    Rectal cancer Neg Hx     Outpatient Encounter Medications as of 03/15/2022  Medication Sig   amLODipine (NORVASC) 5 MG tablet Take  5 mg by mouth daily.   ASPIRIN LOW DOSE 81 MG EC tablet Take 81 mg by mouth daily.   atorvastatin (LIPITOR) 20 MG tablet Take 20 mg by mouth daily.   ferrous sulfate 325 (65 FE) MG tablet Take 1 tablet (325 mg total) by mouth in the morning and at bedtime. (Patient taking differently: Take 325 mg by mouth daily.)   hydrOXYzine (ATARAX/VISTARIL) 25 MG tablet Take 25 mg by mouth every 8 (eight) hours as needed for itching.   levothyroxine (SYNTHROID) 75 MCG tablet Take 1 tablet (75 mcg total) by mouth daily before breakfast.   lisinopril (ZESTRIL) 10 MG tablet Take 10 mg by mouth daily.   Multiple Vitamins-Minerals (CENTRUM SILVER 50+WOMEN) TABS Take 1 tablet by mouth daily.   mycophenolate (CELLCEPT) 500 MG tablet Take 500 mg by mouth 2 (two) times daily.   omeprazole (PRILOSEC) 20 MG capsule Take 20 mg by mouth 2 (two) times daily.   ondansetron (ZOFRAN) 4 MG tablet Take 4 mg by mouth every 8 (eight) hours as needed for  vomiting or nausea.   predniSONE (DELTASONE) 5 MG tablet Take 10 mg by mouth daily.   sulfamethoxazole-trimethoprim (BACTRIM DS) 800-160 MG tablet Take by mouth.   tacrolimus (PROTOPIC) 0.1 % ointment Apply 1 application topically daily as needed (lupus skin irritation).   traMADol HCl 100 MG TABS Take 100 mg by mouth every 8 (eight) hours as needed (pain).   zinc gluconate 50 MG tablet Take 50 mg by mouth daily.   ondansetron (ZOFRAN-ODT) 4 MG disintegrating tablet Take 1 tablet (4 mg total) by mouth every 8 (eight) hours as needed for nausea or vomiting. (Patient not taking: Reported on 03/15/2022)   No facility-administered encounter medications on file as of 03/15/2022.    ALLERGIES: Allergies  Allergen Reactions   Ibuprofen Other (See Comments)    hallucinations Other reaction(s): Delusions (intolerance), Other (See Comments) hallucinations    VACCINATION STATUS: Immunization History  Administered Date(s) Administered   PFIZER(Purple Top)SARS-COV-2 Vaccination 09/12/2019, 06/30/2020     HPI  Candace Robinson is 57 y.o. female who presents today with a medical history as above. she is being seen in follow up after being seen in consultation for hyperthyroidism requested by [name], MD.  she was recently hospitalized for complications with nonhealing wounds on her bilateral feet.  She developed altered mental status and thus thyroid labs were performed showing severely suppressed TSH and high Free T3 levels, in impending thyroid storm.  She was treated with high dose of Methimazole 30 mg po daily and sent to the [facility] for rehabilitation where she has since been sent home.  She also had baseline thyroid ultrasound which did not show any suspicious nodules.  she currently denies dysphagia, choking, shortness of breath, no recent voice change.    she denies family history of thyroid dysfunction and denies family hx of thyroid cancer. she denies personal history of  goiter. she is on any anti-thyroid medications and propanolol for symptom management. Denies use of Biotin containing supplements.    Once her thyroid labs stabilized, we were able to taper and stop her Methimazole and proceed with NM uptake and scan which confirmed Graves disease.  She is s/p RAI therapy on 12/11/20.  She has since started on thyroid hormone replacement therapy.   Review of systems  Constitutional: + Minimally fluctuating body weight,  current Body mass index is 29.23 kg/m. , no fatigue, no subjective hyperthermia, no subjective hypothermia Eyes: no blurry vision,  no xerophthalmia ENT: no sore throat, no nodules palpated in throat, no dysphagia/odynophagia, no hoarseness Cardiovascular: no chest pain, no shortness of breath, no palpitations, no leg swelling Respiratory: no cough, no shortness of breath Gastrointestinal: no nausea/vomiting/diarrhea Musculoskeletal: no muscle/joint aches Skin: no rashes, no hyperemia Neurological: no tremors, no numbness, no tingling, no dizziness Psychiatric: no depression, no anxiety   Objective:    BP 128/71 Comment: Done 03-14-22 per patient  Pulse 92 Comment: taken 03-14-22 per patient  Ht '5\' 3"'$  (1.6 m)   Wt 165 lb (74.8 kg) Comment: per patient  BMI 29.23 kg/m   Wt Readings from Last 3 Encounters:  03/15/22 165 lb (74.8 kg)  01/19/22 173 lb (78.5 kg)  11/24/21 173 lb (78.5 kg)     BP Readings from Last 3 Encounters:  03/15/22 128/71  01/19/22 126/75  08/26/21 114/74    Physical Exam- Telehealth- significantly limited due to nature of visit  Constitutional: Body mass index is 29.23 kg/m. , not in acute distress, normal state of mind Respiratory: Adequate breathing efforts   CMP     Component Value Date/Time   NA 130 (L) 08/25/2021 1131   K 4.9 08/25/2021 1131   CL 100 08/25/2021 1131   CO2 24 08/25/2021 1131   GLUCOSE 93 08/25/2021 1131   BUN 23 (H) 08/25/2021 1131   CREATININE 1.06 (H) 08/25/2021 1131    CREATININE 1.80 (H) 07/06/2021 1111   CALCIUM 9.3 08/25/2021 1131   PROT 9.8 (H) 07/06/2021 1111   ALBUMIN 2.1 (L) 09/10/2020 0243   AST 25 07/06/2021 1111   ALT 9 07/06/2021 1111   ALKPHOS 146 (H) 09/10/2020 0243   BILITOT 0.3 07/06/2021 1111   GFRNONAA >60 08/25/2021 1131     CBC    Component Value Date/Time   WBC 3.8 (L) 08/25/2021 1131   RBC 2.92 (L) 08/25/2021 1131   HGB 7.3 (L) 08/25/2021 1131   HCT 25.1 (L) 08/25/2021 1131   PLT 279 08/25/2021 1131   MCV 86.0 08/25/2021 1131   MCH 25.0 (L) 08/25/2021 1131   MCHC 29.1 (L) 08/25/2021 1131   RDW 18.7 (H) 08/25/2021 1131   LYMPHSABS 783 (L) 07/06/2021 1111   MONOABS 0.4 09/12/2020 0329   EOSABS 17 07/06/2021 1111   BASOSABS 17 07/06/2021 1111     Diabetic Labs (most recent): Lab Results  Component Value Date   HGBA1C 5.2 08/25/2021   HGBA1C 5.6 09/01/2020    Lipid Panel     Component Value Date/Time   CHOL 77 09/01/2020 0342   TRIG 52 09/01/2020 0342   HDL 20 (L) 09/01/2020 0342   CHOLHDL 3.9 09/01/2020 0342   VLDL 10 09/01/2020 0342   LDLCALC 47 09/01/2020 0342     Lab Results  Component Value Date   TSH 0.184 (L) 03/02/2022   TSH 3.110 09/29/2021   TSH 56.600 (H) 07/16/2021   TSH 98.600 (H) 04/13/2021   TSH 0.012 (L) 02/05/2021   TSH 0.01 (A) 11/18/2020   TSH 0.029 (L) 10/09/2020   TSH 0.018 (L) 10/08/2020   TSH <0.010 (L) 09/06/2020   TSH <0.010 (L) 09/04/2020   FREET4 1.53 03/02/2022   FREET4 1.73 09/29/2021   FREET4 0.92 07/16/2021   FREET4 0.22 (L) 04/13/2021   FREET4 1.01 02/05/2021   FREET4 0.73 10/09/2020   FREET4 0.78 10/08/2020   FREET4 1.40 (H) 09/06/2020   FREET4 1.72 (H) 09/04/2020   FREET4 2.53 (H) 08/31/2020     Results for Candace Robinson, Candace Robinson (MRN [id number]) as  of 04/19/2021 11:04  Ref. Range 10/09/2020 08:13 10/09/2020 08:14 11/18/2020 00:00 02/05/2021 09:04 04/13/2021 09:17  TSH Latest Ref Range: 0.450 - 4.500 uIU/mL 0.029 (L)  0.01 (A) 0.012 (L) 98.600 (H)   Triiodothyronine,Free,Serum Latest Ref Range: 2.0 - 4.4 pg/mL  1.5 (L)     T4,Free(Direct) Latest Ref Range: 0.82 - 1.77 ng/dL 0.73   1.01 0.22 (L)  Thyroperoxidase Ab SerPl-aCnc Latest Ref Range: 0 - 34 IU/mL 161 (H)      Thyroglobulin Antibody Latest Ref Range: 0.0 - 0.9 IU/mL <1.0        Latest Reference Range & Units 02/05/21 09:04 04/13/21 09:17 07/16/21 16:53 09/29/21 14:34 03/02/22 14:07  TSH 0.450 - 4.500 uIU/mL 0.012 (L) 98.600 (H) 56.600 (H) 3.110 0.184 (L)  T4,Free(Direct) 0.82 - 1.77 ng/dL 1.01 0.22 (L) 0.92 1.73 1.53  (L): Data is abnormally low (H): Data is abnormally high  Assessment & Plan:   1. 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    Her previsit thyroid function tests are consistent with appropriate hormone replacement.  She is advised to continue Levothyroxine 75 mcg po daily before breakfast.    - The correct intake of thyroid hormone (Levothyroxine, Synthroid), is on empty stomach first thing in the morning, with water, separated by at least 30 minutes from breakfast and other medications,  and separated by more than 4 hours from calcium, iron, multivitamins, acid reflux medications (PPIs).  - This medication is a life-long medication and will be needed to correct thyroid hormone imbalances for the rest of your life.  The dose may change from time to time, based on thyroid blood work.  - It is extremely important to be consistent taking this medication, near the same time each morning.  -AVOID TAKING PRODUCTS CONTAINING BIOTIN (commonly found in Hair, Skin, Nails vitamins) AS IT INTERFERES WITH THE VALIDITY OF THYROID FUNCTION BLOOD TESTS.    -Patient is advised to maintain close follow up with [name], MD for primary care needs.    I spent 20 minutes dedicated to the care of this patient on the date of this encounter to include pre-visit review of records, face-to-face time with the  patient, and post visit ordering of  testing.  Candace Robinson  participated in the discussions, expressed understanding, and voiced agreement with the above plans.  All questions were answered to her satisfaction. she is encouraged to contact clinic should she have any questions or concerns prior to her return visit.     Follow up plan: No follow-ups on file.   Rayetta Pigg, [facility] [address] [place], [place] [address] Phone: [phone] Fax: [phone]  03/15/2022, 2:37 PM

## 2022-09-09 LAB — T4, FREE: Free T4: 1.37 ng/dL (ref 0.82–1.77)

## 2022-09-09 LAB — TSH: TSH: 2.02 u[IU]/mL (ref 0.450–4.500)

## 2022-09-13 ENCOUNTER — Ambulatory Visit: Payer: Medicaid Other | Admitting: Nurse Practitioner

## 2022-09-13 DIAGNOSIS — E890 Postprocedural hypothyroidism: Secondary | ICD-10-CM

## 2022-09-27 ENCOUNTER — Ambulatory Visit (INDEPENDENT_AMBULATORY_CARE_PROVIDER_SITE_OTHER): Payer: Medicaid Other | Admitting: Nurse Practitioner

## 2022-09-27 ENCOUNTER — Encounter: Payer: Self-pay | Admitting: Nurse Practitioner

## 2022-09-27 VITALS — BP 111/72 | HR 86 | Ht 63.0 in | Wt 203.0 lb

## 2022-09-27 DIAGNOSIS — E890 Postprocedural hypothyroidism: Secondary | ICD-10-CM | POA: Diagnosis not present

## 2022-09-27 MED ORDER — LEVOTHYROXINE SODIUM 88 MCG PO TABS: 88.0000 ug | ORAL_TABLET | Freq: Every day | ORAL | 1 refills | Status: DC

## 2022-09-27 NOTE — Patient Instructions (Signed)

## 2022-09-27 NOTE — Progress Notes (Signed)
09/27/2022     Endocrinology Follow Up Note      Subjective:    Patient ID: Candace Robinson, female    DOB: 07-31-1965, PCP [name], MD.   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 right lower leg   COLONOSCOPY  09/18/2015   in [place]-normal exam per pt   FOOT SURGERY  04/2020   debridement   TONSILLECTOMY     age 21    Social History   Socioeconomic History   Marital status: Single    Spouse name: Not on file   Number of children: Not on file   Years of education: Not on file   Highest education level: Not on file  Occupational History   Not on file  Tobacco Use   Smoking status: Never   Smokeless tobacco: Never  Vaping Use   Vaping Use: Never used  Substance and Sexual Activity   Alcohol use: Not Currently   Drug use: Not Currently   Sexual activity: Not on file  Other Topics Concern   Not on file  Social History Narrative   Not on file   Social Determinants of Health   Financial Resource Strain: Not on file  Food Insecurity: Not on file  Transportation Needs: Not on file  Physical Activity: Not on file  Stress: Not on file  Social Connections: Not on file    Family History  Problem Relation Age of Onset   Hypertension Mother    Diabetes Mother    COPD Mother    Glaucoma Mother    Colon cancer Father        unsure age "around 68" perp t   Cancer - Colon Father    Diabetes Sister    Glaucoma Sister    Uveitis Daughter    Lupus Cousin        systemic   Lupus  Cousin        systemic   Lupus Cousin        cutaneous   Stomach cancer Maternal Great-grandmother    Colon polyps Neg Hx    Esophageal cancer Neg Hx    Rectal cancer Neg Hx     Outpatient Encounter Medications as of 09/27/2022  Medication Sig   amLODipine (NORVASC) 5 MG tablet Take 5 mg by mouth daily.   ASPIRIN LOW DOSE 81 MG EC tablet Take 81 mg by mouth daily.   atorvastatin (LIPITOR) 20 MG tablet Take 20 mg by mouth daily.   ferrous sulfate 325 (65 FE) MG tablet Take 1 tablet (325 mg total) by mouth in the morning and at bedtime. (Patient taking differently: Take 325 mg by mouth daily.)   hydrOXYzine (ATARAX/VISTARIL) 25 MG tablet Take 25 mg by mouth every 8 (eight) hours as needed for itching.   lisinopril (ZESTRIL) 10 MG tablet Take 10 mg by mouth daily.   Multiple Vitamins-Minerals (CENTRUM SILVER 50+WOMEN) TABS Take 1 tablet  by mouth daily.   mycophenolate (CELLCEPT) 500 MG tablet Take 500 mg by mouth 2 (two) times daily.   omeprazole (PRILOSEC) 20 MG capsule Take 20 mg by mouth 2 (two) times daily.   ondansetron (ZOFRAN) 4 MG tablet Take 4 mg by mouth every 8 (eight) hours as needed for vomiting or nausea.   pentoxifylline (TRENTAL) 400 MG CR tablet Take 400 mg by mouth 2 (two) times daily.   predniSONE (DELTASONE) 5 MG tablet Take 10 mg by mouth daily.   sulfamethoxazole-trimethoprim (BACTRIM DS) 800-160 MG tablet Take 1 tablet by mouth 2 (two) times daily.   tacrolimus (PROTOPIC) 0.1 % ointment Apply 1 application topically daily as needed (lupus skin irritation).   traMADol HCl 100 MG TABS Take 100 mg by mouth every 8 (eight) hours as needed (pain).   zinc gluconate 50 MG tablet Take 50 mg by mouth daily.   [DISCONTINUED] levothyroxine (SYNTHROID) 75 MCG tablet Take 1 tablet (75 mcg total) by mouth daily before breakfast.   levothyroxine (SYNTHROID) 88 MCG tablet Take 1 tablet (88 mcg total) by mouth daily before breakfast.   ondansetron (ZOFRAN-ODT) 4 MG disintegrating  tablet Take 1 tablet (4 mg total) by mouth every 8 (eight) hours as needed for nausea or vomiting. (Patient not taking: Reported on 03/15/2022)   No facility-administered encounter medications on file as of 09/27/2022.    ALLERGIES: Allergies  Allergen Reactions   Ibuprofen Other (See Comments)    hallucinations Other reaction(s): Delusions (intolerance), Other (See Comments) hallucinations    VACCINATION STATUS: Immunization History  Administered Date(s) Administered   PFIZER(Purple Top)SARS-COV-2 Vaccination 09/12/2019, 06/30/2020     HPI  Candace Robinson is 57 y.o. female who presents today with a medical history as above. she is being seen in follow up after being seen in consultation for hyperthyroidism requested by [name], MD.  she was recently hospitalized for complications with nonhealing wounds on her bilateral feet.  She developed altered mental status and thus thyroid labs were performed showing severely suppressed TSH and high Free T3 levels, in impending thyroid storm.  She was treated with high dose of Methimazole 30 mg po daily and sent to the [facility] for rehabilitation where she has since been sent home.  She also had baseline thyroid ultrasound which did not show any suspicious nodules.  she currently denies dysphagia, choking, shortness of breath, no recent voice change.    she denies family history of thyroid dysfunction and denies family hx of thyroid cancer. she denies personal history of goiter. she is on any anti-thyroid medications and propanolol for symptom management. Denies use of Biotin containing supplements.    Once her thyroid labs stabilized, we were able to taper and stop her Methimazole and proceed with NM uptake and scan which confirmed Graves disease.  She is s/p RAI therapy on 12/11/20.  She has since started on thyroid hormone replacement therapy.   Review of systems  Constitutional: + steadily increasing body weight,  current  Body mass index is 35.96 kg/m. , no fatigue, no subjective hyperthermia, no subjective hypothermia Eyes: no blurry vision, no xerophthalmia ENT: no sore throat, no nodules palpated in throat, no dysphagia/odynophagia, no hoarseness Cardiovascular: no chest pain, no shortness of breath, no palpitations, no leg swelling Respiratory: no cough, no shortness of breath Gastrointestinal: no nausea/vomiting/diarrhea Musculoskeletal: no muscle/joint aches Skin: no rashes, no hyperemia Neurological: no tremors, no numbness, no tingling, no dizziness Psychiatric: no depression, no anxiety   Objective:  BP 111/72 (BP Location: Right Arm, Patient Position: Sitting, Cuff Size: Large)   Pulse 86   Ht 5\' 3"  (1.6 m)   Wt 203 lb (92.1 kg)   BMI 35.96 kg/m   Wt Readings from Last 3 Encounters:  09/27/22 203 lb (92.1 kg)  03/15/22 165 lb (74.8 kg)  01/19/22 173 lb (78.5 kg)     BP Readings from Last 3 Encounters:  09/27/22 111/72  03/15/22 128/71  01/19/22 126/75     Physical Exam- Limited  Constitutional:  Body mass index is 35.96 kg/m. , not in acute distress, normal state of mind Eyes:  EOMI, no exophthalmos Musculoskeletal: no gross deformities, strength intact in all four extremities, no gross restriction of joint movements Skin:  no rashes, no hyperemia Neurological: no tremor with outstretched hands   CMP     Component Value Date/Time   NA 130 (L) 08/25/2021 1131   K 4.9 08/25/2021 1131   CL 100 08/25/2021 1131   CO2 24 08/25/2021 1131   GLUCOSE 93 08/25/2021 1131   BUN 23 (H) 08/25/2021 1131   CREATININE 1.06 (H) 08/25/2021 1131   CREATININE 1.80 (H) 07/06/2021 1111   CALCIUM 9.3 08/25/2021 1131   PROT 9.8 (H) 07/06/2021 1111   ALBUMIN 2.1 (L) 09/10/2020 0243   AST 25 07/06/2021 1111   ALT 9 07/06/2021 1111   ALKPHOS 146 (H) 09/10/2020 0243   BILITOT 0.3 07/06/2021 1111   GFRNONAA >60 08/25/2021 1131     CBC    Component Value Date/Time   WBC 3.8 (L)  08/25/2021 1131   RBC 2.92 (L) 08/25/2021 1131   HGB 7.3 (L) 08/25/2021 1131   HCT 25.1 (L) 08/25/2021 1131   PLT 279 08/25/2021 1131   MCV 86.0 08/25/2021 1131   MCH 25.0 (L) 08/25/2021 1131   MCHC 29.1 (L) 08/25/2021 1131   RDW 18.7 (H) 08/25/2021 1131   LYMPHSABS 783 (L) 07/06/2021 1111   MONOABS 0.4 09/12/2020 0329   EOSABS 17 07/06/2021 1111   BASOSABS 17 07/06/2021 1111     Diabetic Labs (most recent): Lab Results  Component Value Date   HGBA1C 5.2 08/25/2021   HGBA1C 5.6 09/01/2020    Lipid Panel     Component Value Date/Time   CHOL 77 09/01/2020 0342   TRIG 52 09/01/2020 0342   HDL 20 (L) 09/01/2020 0342   CHOLHDL 3.9 09/01/2020 0342   VLDL 10 09/01/2020 0342   LDLCALC 47 09/01/2020 0342     Lab Results  Component Value Date   TSH 2.020 09/08/2022   TSH 0.184 (L) 03/02/2022   TSH 3.110 09/29/2021   TSH 56.600 (H) 07/16/2021   TSH 98.600 (H) 04/13/2021   TSH 0.012 (L) 02/05/2021   TSH 0.01 (A) 11/18/2020   TSH 0.029 (L) 10/09/2020   TSH 0.018 (L) 10/08/2020   TSH <0.010 (L) 09/06/2020   FREET4 1.37 09/08/2022   FREET4 1.53 03/02/2022   FREET4 1.73 09/29/2021   FREET4 0.92 07/16/2021   FREET4 0.22 (L) 04/13/2021   FREET4 1.01 02/05/2021   FREET4 0.73 10/09/2020   FREET4 0.78 10/08/2020   FREET4 1.40 (H) 09/06/2020   FREET4 1.72 (H) 09/04/2020      Latest Reference Range & Units 04/13/21 09:17 07/16/21 16:53 09/29/21 14:34 03/02/22 14:07 09/08/22 14:28  TSH 0.450 - 4.500 uIU/mL 98.600 (H) 56.600 (H) 3.110 0.184 (L) 2.020  T4,Free(Direct) 0.82 - 1.77 ng/dL 0.22 (L) 0.92 1.73 1.53 1.37  (H): Data is abnormally high (L): Data is abnormally low  Assessment & Plan:   1. 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    Her previsit thyroid function tests are consistent with slight under-replacement.  She is advised to increase her Levothyroxine to 88 mcg po daily before breakfast.     -  The correct intake of thyroid hormone (Levothyroxine, Synthroid), is on empty stomach first thing in the morning, with water, separated by at least 30 minutes from breakfast and other medications,  and separated by more than 4 hours from calcium, iron, multivitamins, acid reflux medications (PPIs).  - This medication is a life-long medication and will be needed to correct thyroid hormone imbalances for the rest of your life.  The dose may change from time to time, based on thyroid blood work.  - It is extremely important to be consistent taking this medication, near the same time each morning.  -AVOID TAKING PRODUCTS CONTAINING BIOTIN (commonly found in Hair, Skin, Nails vitamins) AS IT INTERFERES WITH THE VALIDITY OF THYROID FUNCTION BLOOD TESTS.    -Patient is advised to maintain close follow up with [name], MD for primary care needs.     I spent  20  minutes in the care of the patient today including review of labs from Thyroid Function, CMP, and other relevant labs ; imaging/biopsy records (current and previous including abstractions from other facilities); face-to-face time discussing  her lab results and symptoms, medications doses, her options of short and long term treatment based on the latest standards of care / guidelines;   and documenting the encounter.  Sharene Butters Worley  participated in the discussions, expressed understanding, and voiced agreement with the above plans.  All questions were answered to her satisfaction. she is encouraged to contact clinic should she have any questions or concerns prior to her return visit.     Follow up plan: Return in about 3 months (around 12/28/2022) for Thyroid follow up, Previsit labs.  Rayetta Pigg, [facility] [address] [place], [place] [address] Phone: [phone] Fax: [phone]  09/27/2022, 4:31 PM

## 2023-01-10 LAB — TSH: TSH: 4.24 u[IU]/mL (ref 0.450–4.500)

## 2023-01-10 LAB — T4, FREE: Free T4: 1.25 ng/dL (ref 0.82–1.77)

## 2023-01-11 ENCOUNTER — Ambulatory Visit: Payer: Medicare Other | Admitting: Nurse Practitioner

## 2023-01-27 ENCOUNTER — Encounter: Payer: Self-pay | Admitting: Nurse Practitioner

## 2023-01-27 ENCOUNTER — Ambulatory Visit (INDEPENDENT_AMBULATORY_CARE_PROVIDER_SITE_OTHER): Payer: Medicare Other | Admitting: Nurse Practitioner

## 2023-01-27 VITALS — BP 105/69 | HR 89 | Ht 63.0 in | Wt 200.6 lb

## 2023-01-27 DIAGNOSIS — E890 Postprocedural hypothyroidism: Secondary | ICD-10-CM

## 2023-01-27 MED ORDER — LEVOTHYROXINE SODIUM 100 MCG PO TABS
100.0000 ug | ORAL_TABLET | Freq: Every day | ORAL | 1 refills | Status: DC
Start: 2023-01-27 — End: 2023-01-27

## 2023-01-27 MED ORDER — LEVOTHYROXINE SODIUM 100 MCG PO TABS
100.0000 ug | ORAL_TABLET | Freq: Every day | ORAL | 1 refills | Status: DC
Start: 2023-01-27 — End: 2023-05-04

## 2023-01-27 MED ORDER — LEVOTHYROXINE SODIUM 100 MCG PO TABS: 100.0000 ug | ORAL_TABLET | Freq: Every day | ORAL | 1 refills | Status: DC

## 2023-01-27 NOTE — Patient Instructions (Signed)

## 2023-01-27 NOTE — Progress Notes (Signed)
01/27/2023     Endocrinology Follow Up Note      Subjective:    Patient ID: Candace Robinson, female    DOB: 1966/03/22, PCP Waldon Reining, MD.   Past Medical History:  Diagnosis Date   Anemia    Atrial fibrillation [facility])    in setting of hyperthyroidism   CHF (congestive heart failure) (HCC)    Graves disease    Heart murmur    mild AS 02/15/21 echo [facility])   Hypertension    Hyperthyroidism    throtoxicosis 08/2020; s/p RAI therapy 12/11/20 for Graves' Disease   Hypothyroidism    post-RAI therapy   Lupus (HCC)    cutaneous discoid lupus    Past Surgical History:  Procedure Laterality Date   ABLATION Right    approximately 2015. right lower leg   COLONOSCOPY  09/18/2015   in [place]-normal exam per pt   FOOT SURGERY  04/2020   debridement   TONSILLECTOMY     age 38    Social History   Socioeconomic History   Marital status: Single    Spouse name: Not on file   Number of children: Not on file   Years of education: Not on file   Highest education level: Not on file  Occupational History   Not on file  Tobacco Use   Smoking status: Never   Smokeless tobacco: Never  Vaping Use   Vaping status: Never Used  Substance and Sexual Activity   Alcohol use: Not Currently   Drug use: Not Currently   Sexual activity: Not on file  Other Topics Concern   Not on file  Social History Narrative   Not on file   Social Determinants of Health   Financial Resource Strain: Not on file  Food Insecurity: No Food Insecurity (04/27/2020)   Received from [facility], [facility]   Hunger Vital Sign    Worried About Running Out of Food in the Last Year: Never true    Ran Out of Food in the Last Year: Never true  Transportation Needs: No Transportation Needs (04/27/2020)   Received from [facility], [facility]   [facility]    Lack of Transportation (Medical): No    Lack of Transportation (Non-Medical): No  Physical  Activity: Inactive (04/27/2020)   Received from [facility], [facility]   Exercise Vital Sign    Days of Exercise per Week: 0 days    Minutes of Exercise per Session: 0 min  Stress: Not on file  Social Connections: Not on file    Family History  Problem Relation Age of Onset   Hypertension Mother    Diabetes Mother    COPD Mother    Glaucoma Mother    Colon cancer Father        unsure age "around 100" perp t   Cancer - Colon Father    Diabetes Sister    Glaucoma Sister    Uveitis Daughter    Lupus Cousin        systemic   Lupus Cousin        systemic   Lupus Cousin        cutaneous   Stomach cancer Maternal Great-grandmother    Colon polyps Neg Hx    Esophageal cancer Neg Hx    Rectal cancer Neg Hx     Outpatient Encounter Medications as of 01/27/2023  Medication Sig   amLODipine (NORVASC) 5 MG tablet  Take 5 mg by mouth daily.   ASPIRIN LOW DOSE 81 MG EC tablet Take 81 mg by mouth daily.   atorvastatin (LIPITOR) 20 MG tablet Take 20 mg by mouth daily.   ferrous sulfate 325 (65 FE) MG tablet Take 1 tablet (325 mg total) by mouth in the morning and at bedtime. (Patient taking differently: Take 325 mg by mouth daily.)   hydrOXYzine (ATARAX/VISTARIL) 25 MG tablet Take 25 mg by mouth every 8 (eight) hours as needed for itching.   lisinopril (ZESTRIL) 10 MG tablet Take 10 mg by mouth daily.   Multiple Vitamins-Minerals (CENTRUM SILVER 50+WOMEN) TABS Take 1 tablet by mouth daily.   mycophenolate (CELLCEPT) 500 MG tablet Take 500 mg by mouth 2 (two) times daily.   omeprazole (PRILOSEC) 20 MG capsule Take 20 mg by mouth 2 (two) times daily.   ondansetron (ZOFRAN) 4 MG tablet Take 4 mg by mouth every 8 (eight) hours as needed for vomiting or nausea.   ondansetron (ZOFRAN-ODT) 4 MG disintegrating tablet Take 1 tablet (4 mg total) by mouth every 8 (eight) hours as needed for nausea or vomiting.   pentoxifylline (TRENTAL) 400 MG CR tablet Take 400 mg by mouth 2 (two) times  daily.   predniSONE (DELTASONE) 5 MG tablet Take 10 mg by mouth daily.   sulfamethoxazole-trimethoprim (BACTRIM DS) 800-160 MG tablet Take 1 tablet by mouth 2 (two) times daily.   tacrolimus (PROTOPIC) 0.1 % ointment Apply 1 application topically daily as needed (lupus skin irritation).   traMADol HCl 100 MG TABS Take 100 mg by mouth every 8 (eight) hours as needed (pain).   zinc gluconate 50 MG tablet Take 50 mg by mouth daily.   [DISCONTINUED] levothyroxine (SYNTHROID) 88 MCG tablet Take 1 tablet (88 mcg total) by mouth daily before breakfast.   levothyroxine (SYNTHROID) 100 MCG tablet Take 1 tablet (100 mcg total) by mouth daily before breakfast.   [DISCONTINUED] levothyroxine (SYNTHROID) 100 MCG tablet Take 1 tablet (100 mcg total) by mouth daily before breakfast.   [DISCONTINUED] levothyroxine (SYNTHROID) 100 MCG tablet Take 1 tablet (100 mcg total) by mouth daily before breakfast.   [DISCONTINUED] levothyroxine (SYNTHROID) 100 MCG tablet Take 1 tablet (100 mcg total) by mouth daily before breakfast.   No facility-administered encounter medications on file as of 01/27/2023.    ALLERGIES: Allergies  Allergen Reactions   Ibuprofen Other (See Comments)    hallucinations Other reaction(s): Delusions (intolerance), Other (See Comments) hallucinations    VACCINATION STATUS: Immunization History  Administered Date(s) Administered   PFIZER(Purple Top)SARS-COV-2 Vaccination 09/12/2019, 06/30/2020     HPI  Candace Robinson is 57 y.o. female who presents today with a medical history as above. she is being seen in follow up after being seen in consultation for hyperthyroidism requested by Waldon Reining, MD.  she was recently hospitalized for complications with nonhealing wounds on her bilateral feet.  She developed altered mental status and thus thyroid labs were performed showing severely suppressed TSH and high Free T3 levels, in impending thyroid storm.  She was treated with high  dose of Methimazole 30 mg po daily and sent to the [facility] for rehabilitation where she has since been sent home.  She also had baseline thyroid ultrasound which did not show any suspicious nodules.  she currently denies dysphagia, choking, shortness of breath, no recent voice change.    she denies family history of thyroid dysfunction and denies family hx of thyroid cancer. she denies personal history of goiter. she is  on any anti-thyroid medications and propanolol for symptom management. Denies use of Biotin containing supplements.    Once her thyroid labs stabilized, we were able to taper and stop her Methimazole and proceed with NM uptake and scan which confirmed Graves disease.  She is s/p RAI therapy on 12/11/20.  She has since started on thyroid hormone replacement therapy.   Review of systems  Constitutional: + Minimally fluctuating body weight,  current Body mass index is 35.53 kg/m. , no fatigue, no subjective hyperthermia, no subjective hypothermia Eyes: no blurry vision, no xerophthalmia ENT: no sore throat, no nodules palpated in throat, no dysphagia/odynophagia, no hoarseness Cardiovascular: no chest pain, no shortness of breath, no palpitations, no leg swelling Respiratory: no cough, no shortness of breath Gastrointestinal: no nausea/vomiting/diarrhea Musculoskeletal: no muscle/joint aches Skin: no rashes, no hyperemia Neurological: no tremors, no numbness, no tingling, no dizziness Psychiatric: no depression, no anxiety   Objective:    BP 105/69 (BP Location: Right Arm, Patient Position: Sitting, Cuff Size: Large)   Pulse 89   Ht 5\' 3"  (1.6 m)   Wt 200 lb 9.6 oz (91 kg)   BMI 35.53 kg/m   Wt Readings from Last 3 Encounters:  01/27/23 200 lb 9.6 oz (91 kg)  09/27/22 203 lb (92.1 kg)  03/15/22 165 lb (74.8 kg)     BP Readings from Last 3 Encounters:  01/27/23 105/69  09/27/22 111/72  03/15/22 128/71     Physical Exam- Limited  Constitutional:  Body mass  index is 35.53 kg/m. , not in acute distress, normal state of mind Eyes:  EOMI, no exophthalmos Musculoskeletal: no gross deformities, strength intact in all four extremities, no gross restriction of joint movements Skin:  no rashes, no hyperemia Neurological: no tremor with outstretched hands   CMP     Component Value Date/Time   NA 130 (L) 08/25/2021 1131   K 4.9 08/25/2021 1131   CL 100 08/25/2021 1131   CO2 24 08/25/2021 1131   GLUCOSE 93 08/25/2021 1131   BUN 23 (H) 08/25/2021 1131   CREATININE 1.06 (H) 08/25/2021 1131   CREATININE 1.80 (H) 07/06/2021 1111   CALCIUM 9.3 08/25/2021 1131   PROT 9.8 (H) 07/06/2021 1111   ALBUMIN 2.1 (L) 09/10/2020 0243   AST 25 07/06/2021 1111   ALT 9 07/06/2021 1111   ALKPHOS 146 (H) 09/10/2020 0243   BILITOT 0.3 07/06/2021 1111   GFRNONAA >60 08/25/2021 1131     CBC    Component Value Date/Time   WBC 3.8 (L) 08/25/2021 1131   RBC 2.92 (L) 08/25/2021 1131   HGB 7.3 (L) 08/25/2021 1131   HCT 25.1 (L) 08/25/2021 1131   PLT 279 08/25/2021 1131   MCV 86.0 08/25/2021 1131   MCH 25.0 (L) 08/25/2021 1131   MCHC 29.1 (L) 08/25/2021 1131   RDW 18.7 (H) 08/25/2021 1131   LYMPHSABS 783 (L) 07/06/2021 1111   MONOABS 0.4 09/12/2020 0329   EOSABS 17 07/06/2021 1111   BASOSABS 17 07/06/2021 1111     Diabetic Labs (most recent): Lab Results  Component Value Date   HGBA1C 5.2 08/25/2021   HGBA1C 5.6 09/01/2020    Lipid Panel     Component Value Date/Time   CHOL 77 09/01/2020 0342   TRIG 52 09/01/2020 0342   HDL 20 (L) 09/01/2020 0342   CHOLHDL 3.9 09/01/2020 0342   VLDL 10 09/01/2020 0342   LDLCALC 47 09/01/2020 0342     Lab Results  Component Value Date   TSH  4.240 01/09/2023   TSH 2.020 09/08/2022   TSH 0.184 (L) 03/02/2022   TSH 3.110 09/29/2021   TSH 56.600 (H) 07/16/2021   TSH 98.600 (H) 04/13/2021   TSH 0.012 (L) 02/05/2021   TSH 0.01 (A) 11/18/2020   TSH 0.029 (L) 10/09/2020   TSH 0.018 (L) 10/08/2020   FREET4  1.25 01/09/2023   FREET4 1.37 09/08/2022   FREET4 1.53 03/02/2022   FREET4 1.73 09/29/2021   FREET4 0.92 07/16/2021   FREET4 0.22 (L) 04/13/2021   FREET4 1.01 02/05/2021   FREET4 0.73 10/09/2020   FREET4 0.78 10/08/2020   FREET4 1.40 (H) 09/06/2020      Latest Reference Range & Units 07/16/21 16:53 09/29/21 14:34 03/02/22 14:07 09/08/22 14:28 01/09/23 13:14  TSH 0.450 - 4.500 uIU/mL 56.600 (H) 3.110 0.184 (L) 2.020 4.240  T4,Free(Direct) 0.82 - 1.77 ng/dL 4.25 9.56 3.87 5.64 3.32  (H): Data is abnormally high (L): Data is abnormally low  Assessment & Plan:   1. 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    Her previsit thyroid function tests are consistent with slight under-replacement.  She is advised to increase her Levothyroxine to 100 mcg po daily before breakfast.     - The correct intake of thyroid hormone (Levothyroxine, Synthroid), is on empty stomach first thing in the morning, with water, separated by at least 30 minutes from breakfast and other medications,  and separated by more than 4 hours from calcium, iron, multivitamins, acid reflux medications (PPIs).  - This medication is a life-long medication and will be needed to correct thyroid hormone imbalances for the rest of your life.  The dose may change from time to time, based on thyroid blood work.  - It is extremely important to be consistent taking this medication, near the same time each morning.  -AVOID TAKING PRODUCTS CONTAINING BIOTIN (commonly found in Hair, Skin, Nails vitamins) AS IT INTERFERES WITH THE VALIDITY OF THYROID FUNCTION BLOOD TESTS.    -Patient is advised to maintain close follow up with Waldon Reining, MD for primary care needs.    I spent  16  minutes in the care of the patient today including review of labs from Thyroid Function, CMP, and other relevant labs ; imaging/biopsy records (current and previous including  abstractions from other facilities); face-to-face time discussing  her lab results and symptoms, medications doses, her options of short and long term treatment based on the latest standards of care / guidelines;   and documenting the encounter.  Maximino Greenland Hockey  participated in the discussions, expressed understanding, and voiced agreement with the above plans.  All questions were answered to her satisfaction. she is encouraged to contact clinic should she have any questions or concerns prior to her return visit.     Follow up plan: Return in about 3 months (around 04/29/2023) for Thyroid follow up, Previsit labs.  Ronny Bacon, [facility] [address] [place], [place] [address] Phone: [phone] Fax: [phone]  01/27/2023, 11:03 AM

## 2023-02-26 IMAGING — CT CT HEAD W/O CM
4 series · 17 of 47 positions shown, 19 images · non-contrast
Comparison: CT head 05/06/2020

CLINICAL DATA: Mental status change

EXAM:
CT HEAD WITHOUT CONTRAST
TECHNIQUE: Contiguous axial images were obtained from the base of the skull
through the vertex without intravenous contrast.

[Series 3: head bone · axial · 0.39mm/px · z∈[-129,-73]mm · 4 of 82 slices shown]
[im 9/82  bone]
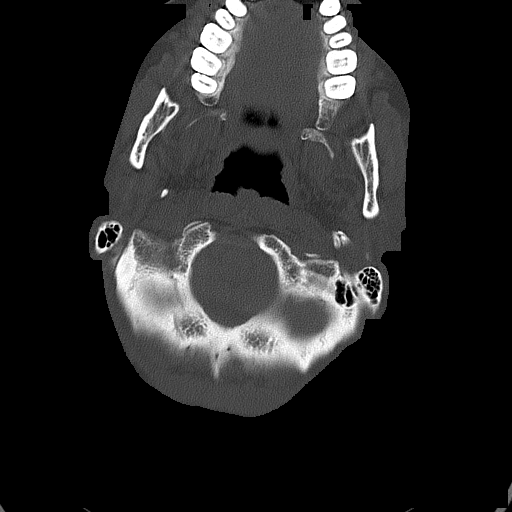
[im 17/82  bone]
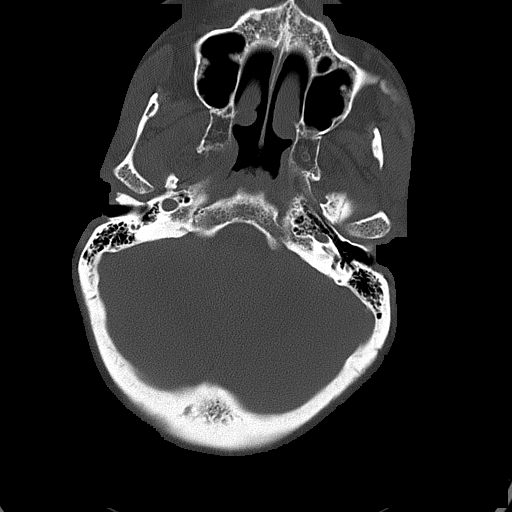
[im 25/82  bone]
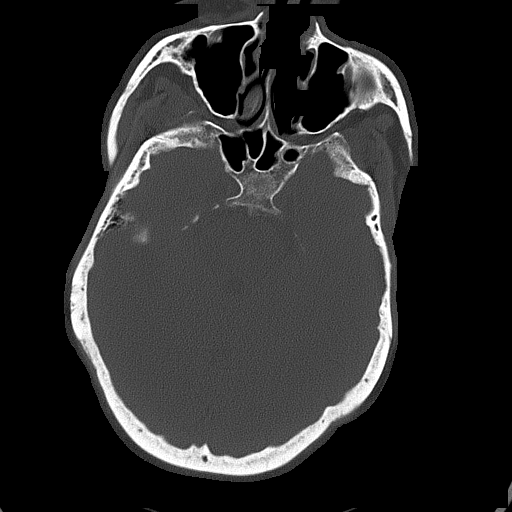
[im 37/82  bone]
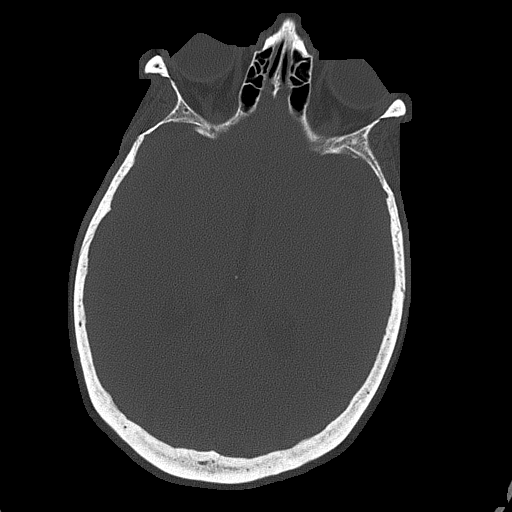

[Series 4: head without · axial · non-contrast · 0.39mm/px · z∈[-125,-5]mm · 7 of 33 slices shown, 9 images]
[im 5/33  brain]
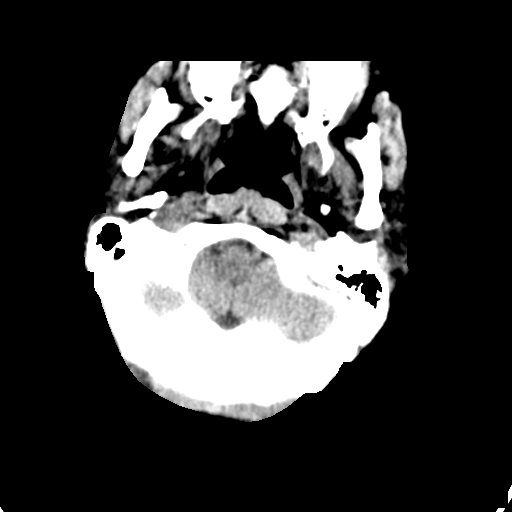
[im 5/33  bone]
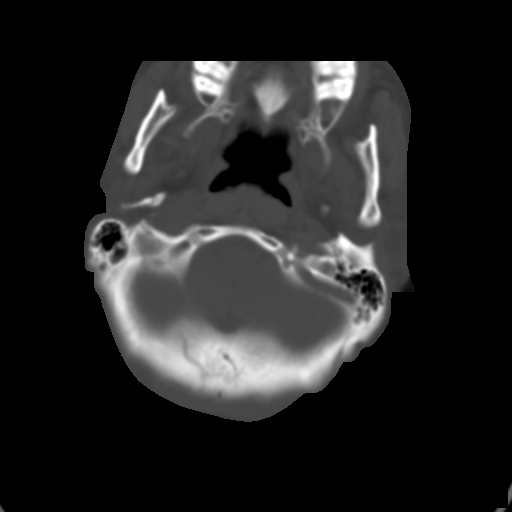
[im 9/33  brain]
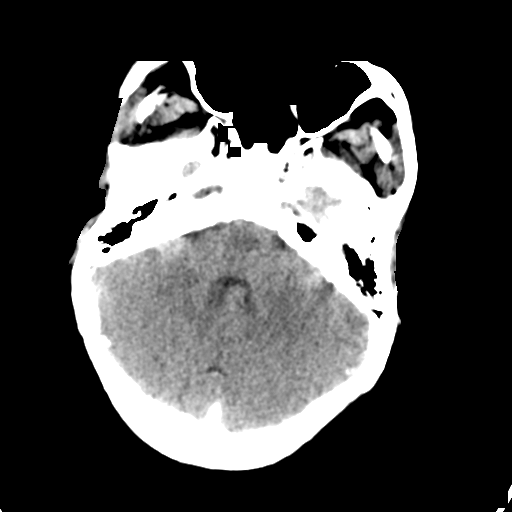
[im 13/33  brain]
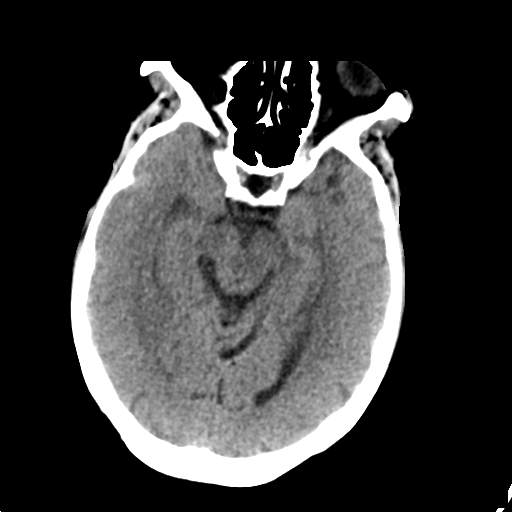
[im 17/33  brain]
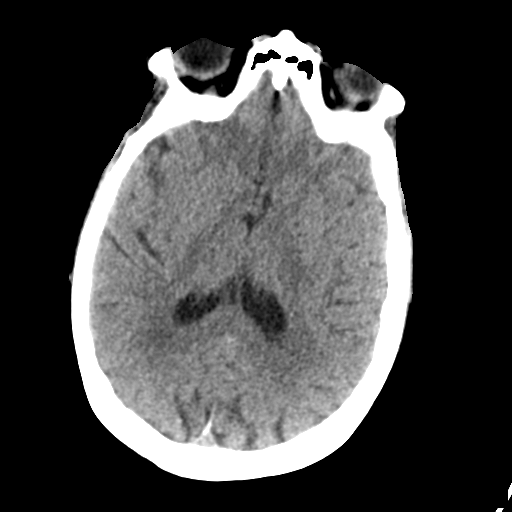
[im 21/33  brain]
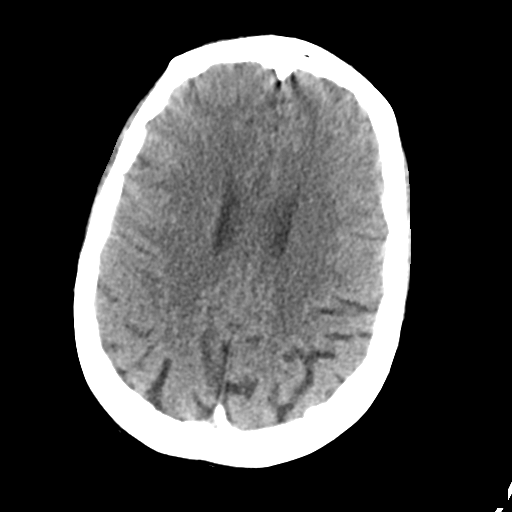
[im 21/33  bone]
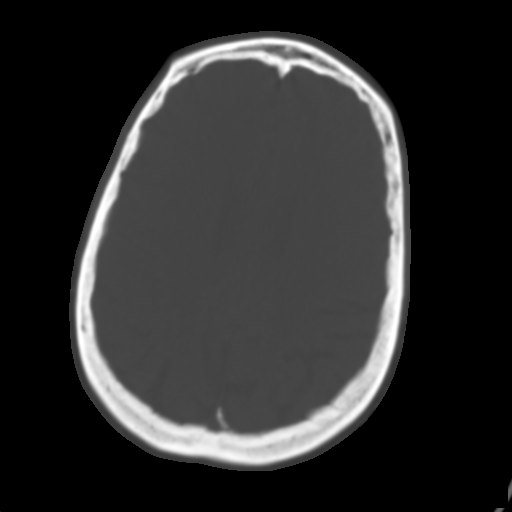
[im 25/33  brain]
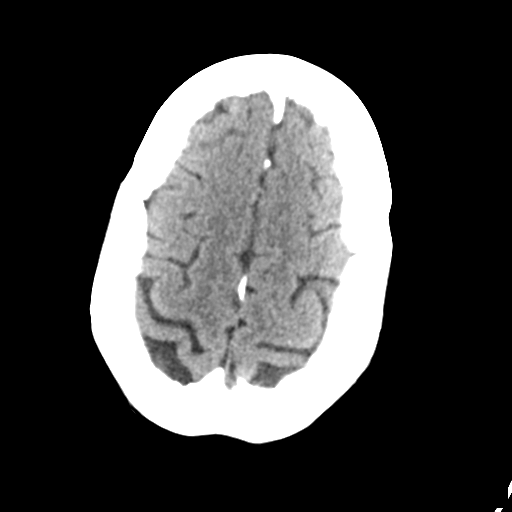
[im 29/33  brain]
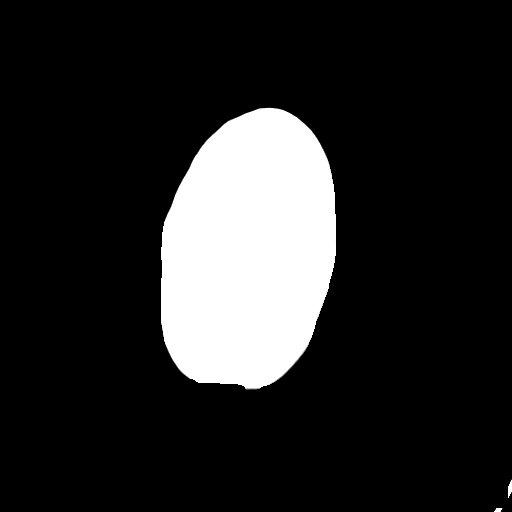

[Series 5: head without cor · coronal · non-contrast · 0.31mm/px · 3 of 66 slices shown]
[im 22/66  brain]
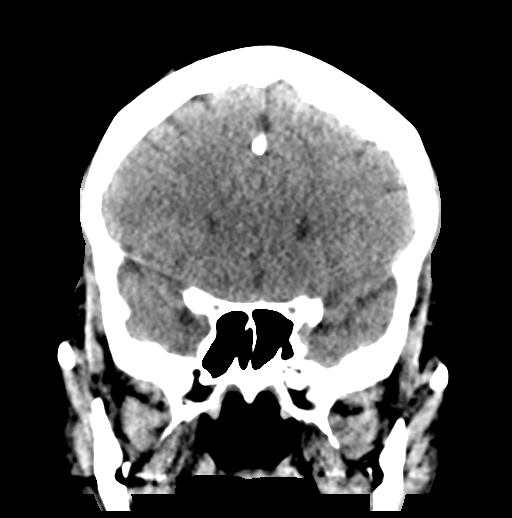
[im 29/66  brain]
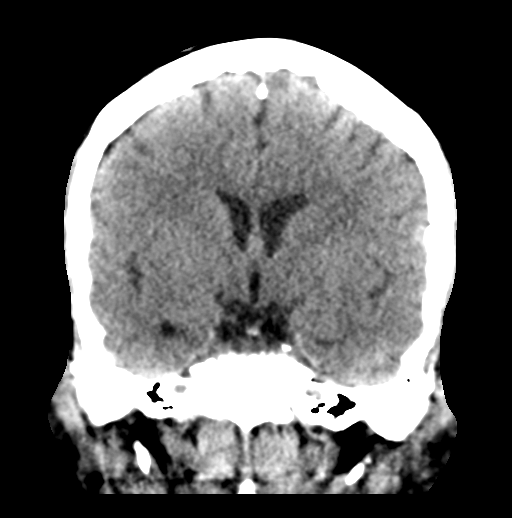
[im 37/66  brain]
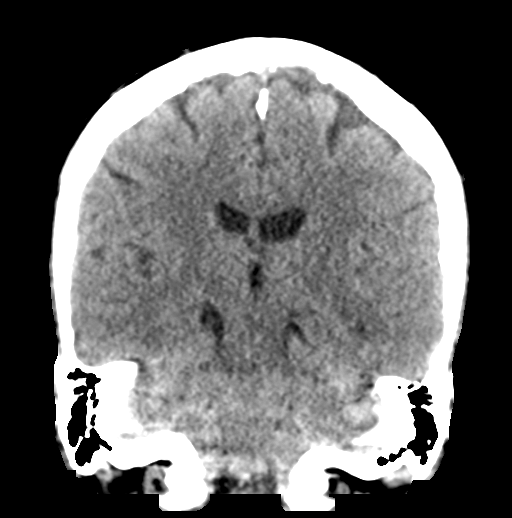

[Series 6: head without sag · sagittal · non-contrast · 0.32mm/px · 3 of 54 slices shown]
[im 18/54  brain]
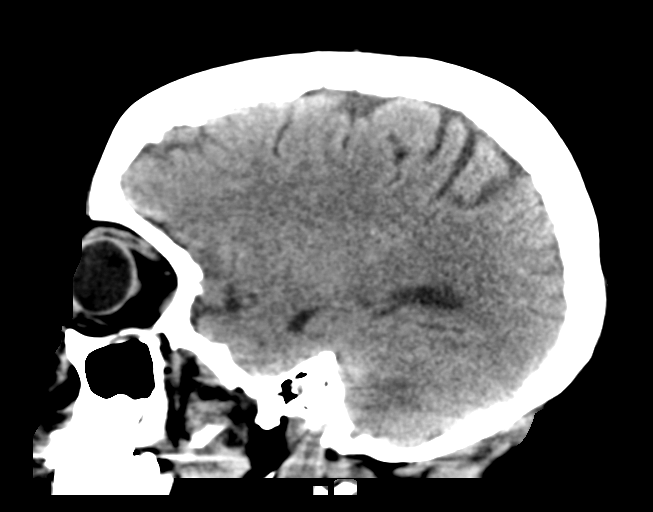
[im 27/54  brain]
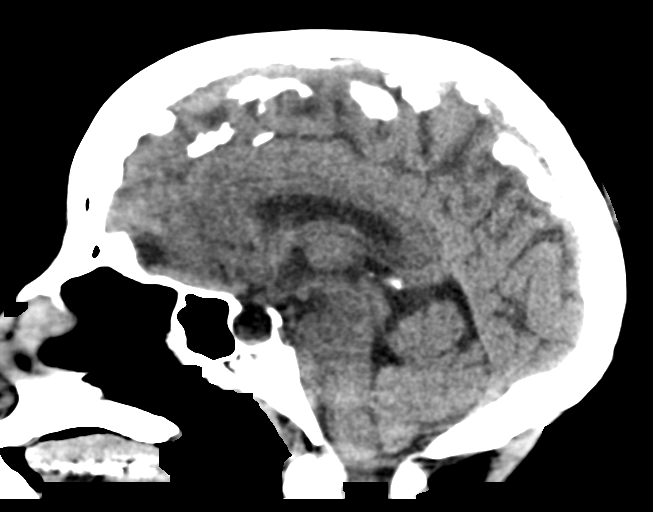
[im 36/54  brain]
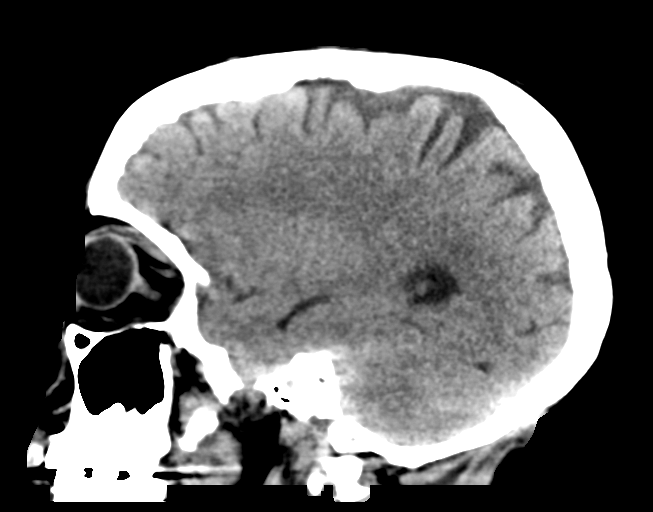

[17 of 47 positions shown; findings below may reference images not displayed]

FINDINGS: Brain: No evidence of acute infarction, hemorrhage, hydrocephalus,
extra-axial collection or mass lesion/mass effect. Empty sella
likely incidental and unchanged.

Vascular: Negative for hyperdense vessel

Skull: Negative

Sinuses/Orbits: Paranasal sinuses clear.  Negative orbit

Other: None
IMPRESSION: Negative CT head

## 2023-02-26 IMAGING — CR DG FOOT COMPLETE 3+V*R*
3 series · 3 of 3 positions shown · non-contrast
Comparison: None.

CLINICAL DATA: Bilateral foot ulcer

EXAM:
RIGHT FOOT COMPLETE - 3+ VIEW

[foot ap]
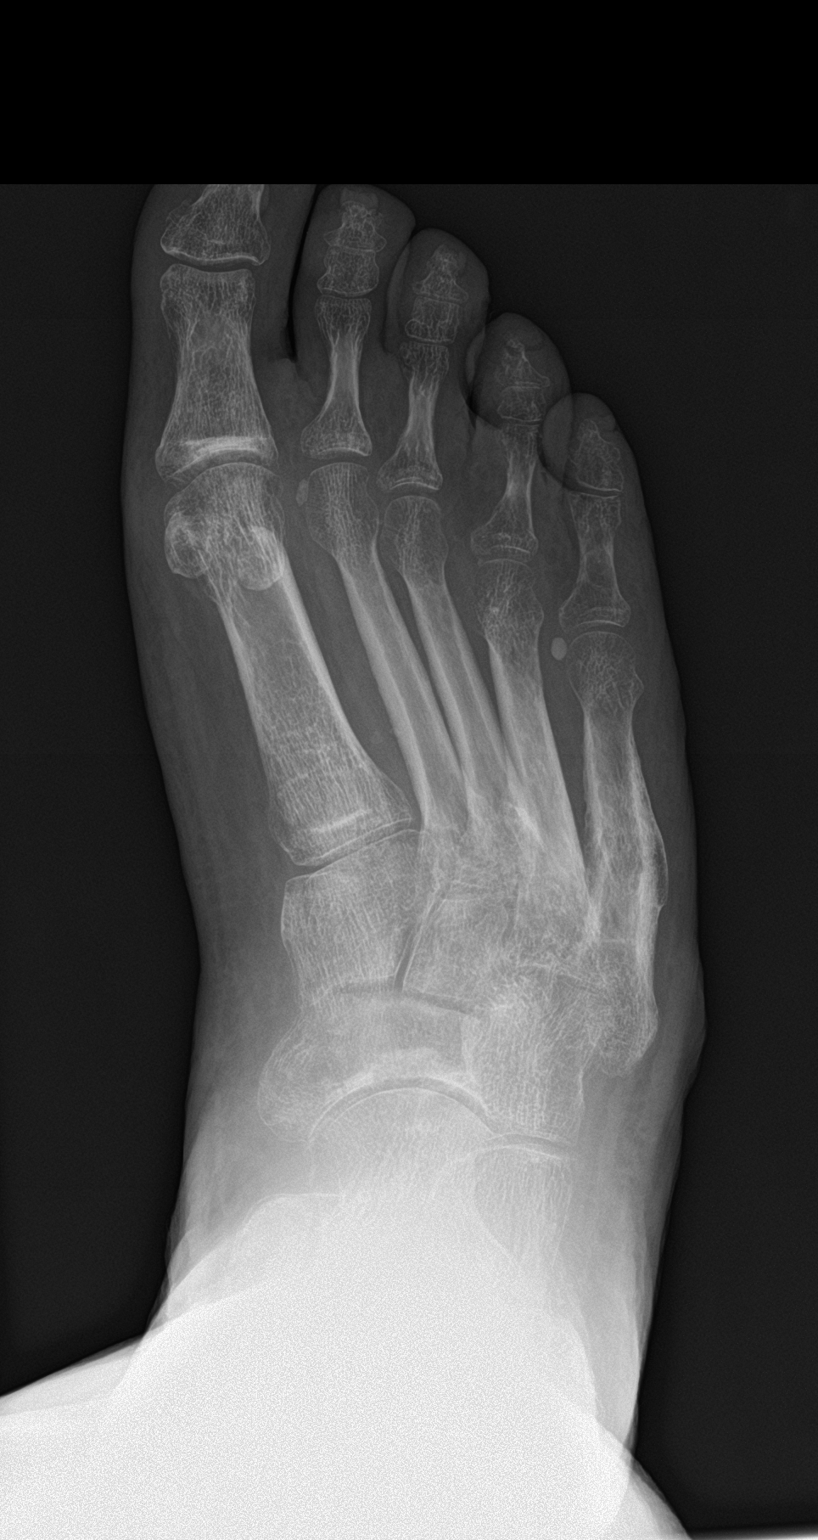

[foot obl]
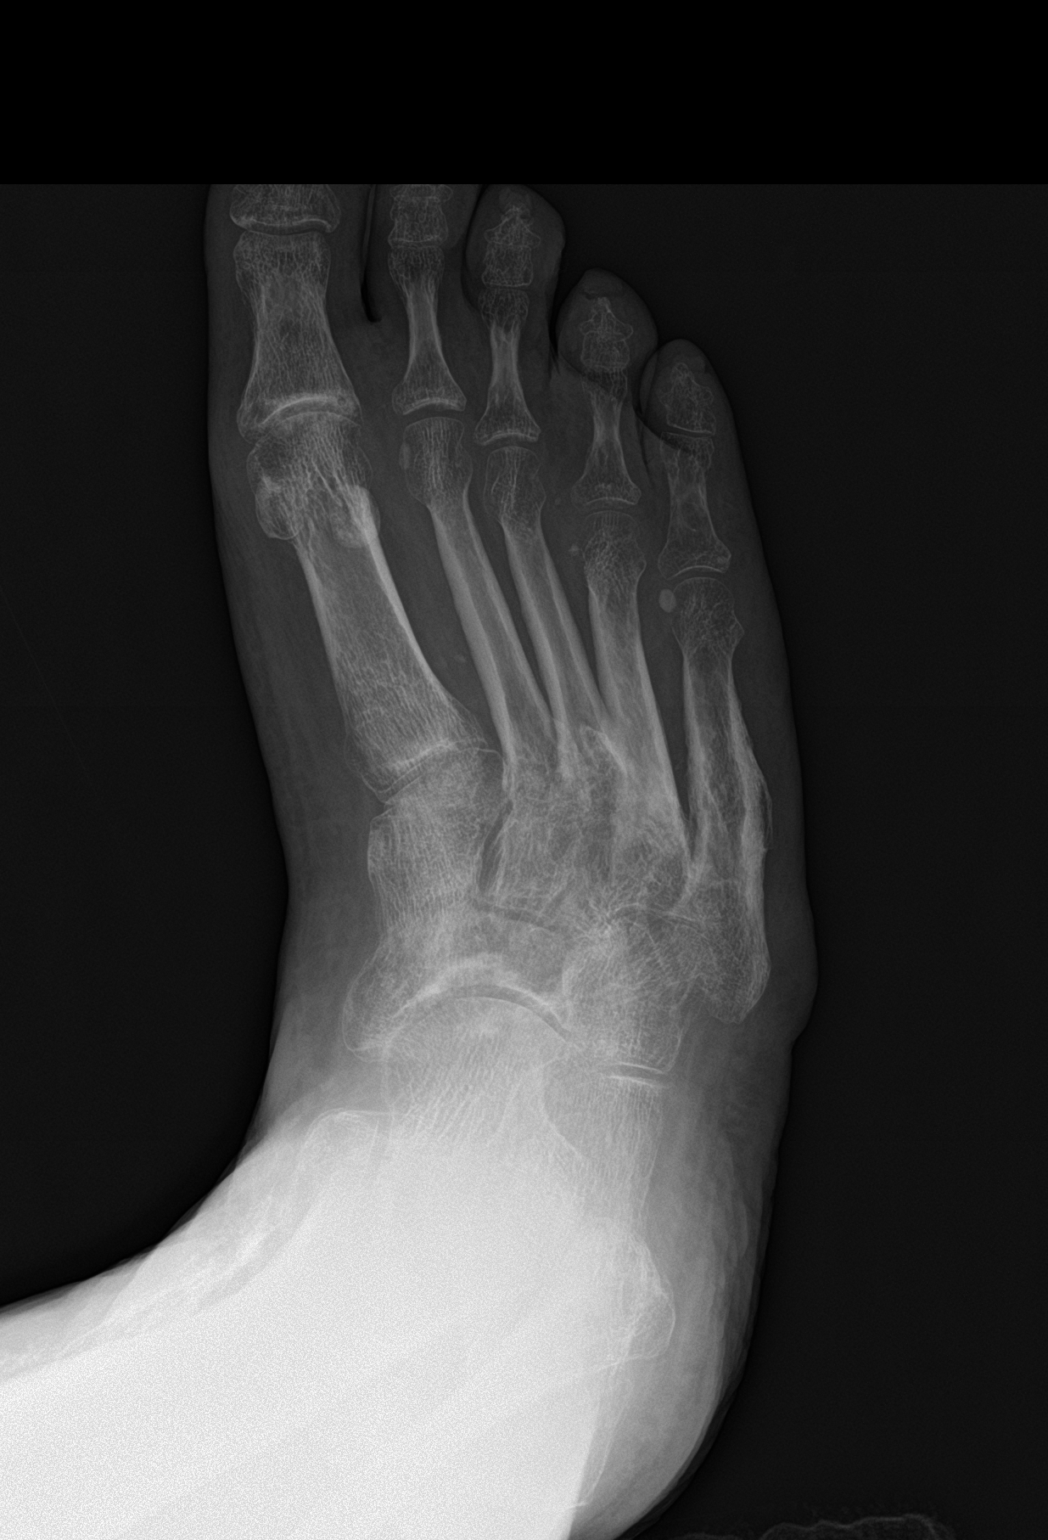

[foot lat]
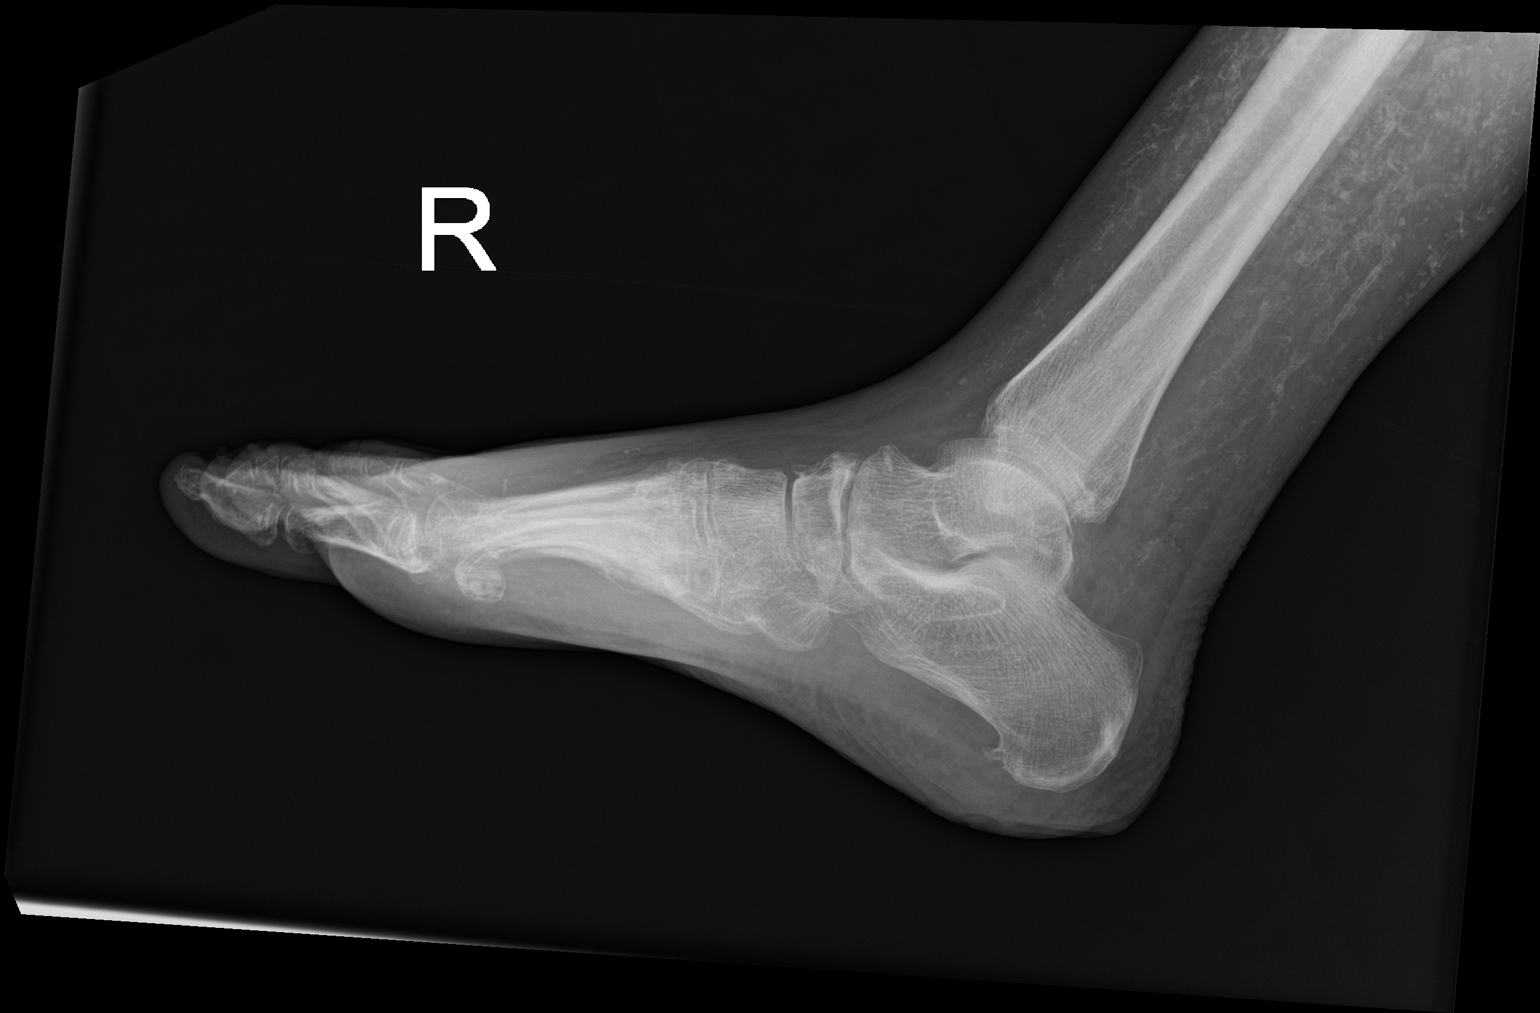

[3 of 3 positions shown; findings below may reference images not displayed]

FINDINGS: There is no evidence of fracture or dislocation. Healed fracture
deformity of the fifth metatarsal is seen. No definite evidence of
cortical destruction or periosteal reaction. Midfoot osteoarthritis
is seen with joint space loss and dorsal osteophyte formation.
Dorsal subcutaneous edema is seen.
IMPRESSION: No definite evidence of osteomyelitis.  Dorsal soft tissue swelling.

## 2023-05-02 ENCOUNTER — Encounter: Payer: Medicare Other | Admitting: Obstetrics and Gynecology

## 2023-05-02 LAB — TSH: TSH: 0.264 u[IU]/mL — ABNORMAL LOW (ref 0.450–4.500)

## 2023-05-02 LAB — T4, FREE: Free T4: 1.46 ng/dL (ref 0.82–1.77)

## 2023-05-02 NOTE — Patient Instructions (Signed)

## 2023-05-04 ENCOUNTER — Encounter: Payer: Self-pay | Admitting: Nurse Practitioner

## 2023-05-04 ENCOUNTER — Ambulatory Visit (INDEPENDENT_AMBULATORY_CARE_PROVIDER_SITE_OTHER): Payer: Medicare Other | Admitting: Nurse Practitioner

## 2023-05-04 VITALS — BP 101/63 | HR 80 | Ht 63.0 in | Wt 196.0 lb

## 2023-05-04 DIAGNOSIS — E890 Postprocedural hypothyroidism: Secondary | ICD-10-CM

## 2023-05-04 MED ORDER — LEVOTHYROXINE SODIUM 100 MCG PO TABS: 100.0000 ug | ORAL_TABLET | Freq: Every day | ORAL | 1 refills | Status: DC

## 2023-05-04 MED ORDER — ZEPBOUND 5 MG/0.5ML ~~LOC~~ SOAJ: 5.0000 mg | SUBCUTANEOUS | 0 refills | Status: DC

## 2023-05-04 MED ORDER — ZEPBOUND 2.5 MG/0.5ML ~~LOC~~ SOAJ
2.5000 mg | SUBCUTANEOUS | 0 refills | Status: DC
Start: 2023-05-04 — End: 2023-08-10

## 2023-05-04 MED ORDER — LEVOTHYROXINE SODIUM 100 MCG PO TABS
100.0000 ug | ORAL_TABLET | Freq: Every day | ORAL | 1 refills | Status: DC
Start: 2023-05-04 — End: 2023-08-10

## 2023-05-04 NOTE — Progress Notes (Signed)
05/04/2023     Endocrinology Follow Up Note      Subjective:    Patient ID: Candace Robinson, female    DOB: 01-22-1966, PCP Waldon Reining, MD.   Past Medical History:  Diagnosis Date   Anemia    Atrial fibrillation [facility])    in setting of hyperthyroidism   CHF (congestive heart failure) (HCC)    Graves disease    Heart murmur    mild AS 02/15/21 echo [facility])   Hypertension    Hyperthyroidism    throtoxicosis 08/2020; s/p RAI therapy 12/11/20 for Graves' Disease   Hypothyroidism    post-RAI therapy   Lupus    cutaneous discoid lupus    Past Surgical History:  Procedure Laterality Date   ABLATION Right    approximately 2015. right lower leg   COLONOSCOPY  09/18/2015   in [place]-normal exam per pt   FOOT SURGERY  04/2020   debridement   TONSILLECTOMY     age 50    Social History   Socioeconomic History   Marital status: Single    Spouse name: Not on file   Number of children: Not on file   Years of education: Not on file   Highest education level: Not on file  Occupational History   Not on file  Tobacco Use   Smoking status: Never   Smokeless tobacco: Never  Vaping Use   Vaping status: Never Used  Substance and Sexual Activity   Alcohol use: Not Currently   Drug use: Not Currently   Sexual activity: Not on file  Other Topics Concern   Not on file  Social History Narrative   Not on file   Social Determinants of Health   Financial Resource Strain: Not on file  Food Insecurity: No Food Insecurity (04/27/2020)   Received from [facility], [facility]   Hunger Vital Sign    Worried About Running Out of Food in the Last Year: Never true    Ran Out of Food in the Last Year: Never true  Transportation Needs: No Transportation Needs (04/27/2020)   Received from [facility], [facility]   [facility] - Transportation    Lack of Transportation (Medical): No    Lack of Transportation (Non-Medical): No  Physical Activity:  Inactive (04/27/2020)   Received from [facility], [facility]   Exercise Vital Sign    Days of Exercise per Week: 0 days    Minutes of Exercise per Session: 0 min  Stress: Not on file  Social Connections: Not on file    Family History  Problem Relation Age of Onset   Hypertension Mother    Diabetes Mother    COPD Mother    Glaucoma Mother    Colon cancer Father        unsure age "around 39" perp t   Cancer - Colon Father    Diabetes Sister    Glaucoma Sister    Uveitis Daughter    Lupus Cousin        systemic   Lupus Cousin        systemic   Lupus Cousin        cutaneous   Stomach cancer Maternal Great-grandmother    Colon polyps Neg Hx    Esophageal cancer Neg Hx    Rectal cancer Neg Hx     Outpatient Encounter Medications as of 05/04/2023  Medication Sig   amLODipine (NORVASC) 5 MG tablet Take  5 mg by mouth daily.   ASPIRIN LOW DOSE 81 MG EC tablet Take 81 mg by mouth daily.   atorvastatin (LIPITOR) 20 MG tablet Take 20 mg by mouth daily.   doxycycline (VIBRA-TABS) 100 MG tablet Take 100 mg by mouth daily.   ferrous sulfate 325 (65 FE) MG tablet Take 1 tablet (325 mg total) by mouth in the morning and at bedtime. (Patient taking differently: Take 325 mg by mouth daily.)   hydrOXYzine (ATARAX/VISTARIL) 25 MG tablet Take 25 mg by mouth every 8 (eight) hours as needed for itching.   lisinopril (ZESTRIL) 10 MG tablet Take 10 mg by mouth daily.   Multiple Vitamins-Minerals (CENTRUM SILVER 50+WOMEN) TABS Take 1 tablet by mouth daily.   mycophenolate (CELLCEPT) 500 MG tablet Take 500 mg by mouth 2 (two) times daily.   omeprazole (PRILOSEC) 20 MG capsule Take 20 mg by mouth 2 (two) times daily.   ondansetron (ZOFRAN) 4 MG tablet Take 4 mg by mouth every 8 (eight) hours as needed for vomiting or nausea.   ondansetron (ZOFRAN-ODT) 4 MG disintegrating tablet Take 1 tablet (4 mg total) by mouth every 8 (eight) hours as needed for nausea or vomiting.   pentoxifylline  (TRENTAL) 400 MG CR tablet Take 400 mg by mouth 2 (two) times daily.   predniSONE (DELTASONE) 5 MG tablet Take 10 mg by mouth daily.   tirzepatide (ZEPBOUND) 2.5 MG/0.5ML Pen Inject 2.5 mg into the skin once a week.   tirzepatide (ZEPBOUND) 5 MG/0.5ML Pen Inject 5 mg into the skin once a week.   traMADol HCl 100 MG TABS Take 100 mg by mouth every 8 (eight) hours as needed (pain).   zinc gluconate 50 MG tablet Take 50 mg by mouth daily.   [DISCONTINUED] levothyroxine (SYNTHROID) 100 MCG tablet Take 1 tablet (100 mcg total) by mouth daily before breakfast.   levothyroxine (SYNTHROID) 100 MCG tablet Take 1 tablet (100 mcg total) by mouth daily before breakfast.   sulfamethoxazole-trimethoprim (BACTRIM DS) 800-160 MG tablet Take 1 tablet by mouth 2 (two) times daily. (Patient not taking: Reported on 05/04/2023)   tacrolimus (PROTOPIC) 0.1 % ointment Apply 1 application topically daily as needed (lupus skin irritation). (Patient not taking: Reported on 05/04/2023)   [DISCONTINUED] levothyroxine (SYNTHROID) 100 MCG tablet Take 1 tablet (100 mcg total) by mouth daily before breakfast.   No facility-administered encounter medications on file as of 05/04/2023.    ALLERGIES: Allergies  Allergen Reactions   Ibuprofen Other (See Comments)    hallucinations Other reaction(s): Delusions (intolerance), Other (See Comments) hallucinations    VACCINATION STATUS: Immunization History  Administered Date(s) Administered   PFIZER(Purple Top)SARS-COV-2 Vaccination 09/12/2019, 06/30/2020     HPI  Candace Robinson is 57 y.o. female who presents today with a medical history as above. she is being seen in follow up after being seen in consultation for hyperthyroidism requested by Waldon Reining, MD.  she was recently hospitalized for complications with nonhealing wounds on her bilateral feet.  She developed altered mental status and thus thyroid labs were performed showing severely suppressed TSH and  high Free T3 levels, in impending thyroid storm.  She was treated with high dose of Methimazole 30 mg po daily and sent to the [facility] for rehabilitation where she has since been sent home.  She also had baseline thyroid ultrasound which did not show any suspicious nodules.  she currently denies dysphagia, choking, shortness of breath, no recent voice change.    she denies family history  of thyroid dysfunction and denies family hx of thyroid cancer. she denies personal history of goiter. she is on any anti-thyroid medications and propanolol for symptom management. Denies use of Biotin containing supplements.    Once her thyroid labs stabilized, we were able to taper and stop her Methimazole and proceed with NM uptake and scan which confirmed Graves disease.  She is s/p RAI therapy on 12/11/20.  She has since started on thyroid hormone replacement therapy.   Review of systems  Constitutional: + slowly decreasing body weight,  current Body mass index is 34.72 kg/m. , no fatigue, no subjective hyperthermia, no subjective hypothermia Eyes: no blurry vision, no xerophthalmia ENT: no sore throat, no nodules palpated in throat, no dysphagia/odynophagia, no hoarseness Cardiovascular: no chest pain, no shortness of breath, no palpitations, no leg swelling Respiratory: no cough, no shortness of breath Gastrointestinal: no nausea/vomiting/diarrhea Musculoskeletal: no muscle/joint aches Skin: no rashes, no hyperemia Neurological: no tremors, no numbness, no tingling, no dizziness Psychiatric: no depression, no anxiety   Objective:    BP 101/63 (BP Location: Right Arm, Patient Position: Sitting, Cuff Size: Large)   Pulse 80   Ht 5\' 3"  (1.6 m)   Wt 196 lb (88.9 kg)   BMI 34.72 kg/m   Wt Readings from Last 3 Encounters:  05/04/23 196 lb (88.9 kg)  01/27/23 200 lb 9.6 oz (91 kg)  09/27/22 203 lb (92.1 kg)     BP Readings from Last 3 Encounters:  05/04/23 101/63  01/27/23 105/69  09/27/22  111/72     Physical Exam- Limited  Constitutional:  Body mass index is 34.72 kg/m. , not in acute distress, normal state of mind Eyes:  EOMI, no exophthalmos Neck: Supple Musculoskeletal: no gross deformities, strength intact in all four extremities, no gross restriction of joint movements Skin:  no rashes, no hyperemia Neurological: no tremor with outstretched hands   CMP     Component Value Date/Time   NA 130 (L) 08/25/2021 1131   K 4.9 08/25/2021 1131   CL 100 08/25/2021 1131   CO2 24 08/25/2021 1131   GLUCOSE 93 08/25/2021 1131   BUN 23 (H) 08/25/2021 1131   CREATININE 1.06 (H) 08/25/2021 1131   CREATININE 1.80 (H) 07/06/2021 1111   CALCIUM 9.3 08/25/2021 1131   PROT 9.8 (H) 07/06/2021 1111   ALBUMIN 2.1 (L) 09/10/2020 0243   AST 25 07/06/2021 1111   ALT 9 07/06/2021 1111   ALKPHOS 146 (H) 09/10/2020 0243   BILITOT 0.3 07/06/2021 1111   GFRNONAA >60 08/25/2021 1131     CBC    Component Value Date/Time   WBC 3.8 (L) 08/25/2021 1131   RBC 2.92 (L) 08/25/2021 1131   HGB 7.3 (L) 08/25/2021 1131   HCT 25.1 (L) 08/25/2021 1131   PLT 279 08/25/2021 1131   MCV 86.0 08/25/2021 1131   MCH 25.0 (L) 08/25/2021 1131   MCHC 29.1 (L) 08/25/2021 1131   RDW 18.7 (H) 08/25/2021 1131   LYMPHSABS 783 (L) 07/06/2021 1111   MONOABS 0.4 09/12/2020 0329   EOSABS 17 07/06/2021 1111   BASOSABS 17 07/06/2021 1111     Diabetic Labs (most recent): Lab Results  Component Value Date   HGBA1C 5.2 08/25/2021   HGBA1C 5.6 09/01/2020    Lipid Panel     Component Value Date/Time   CHOL 77 09/01/2020 0342   TRIG 52 09/01/2020 0342   HDL 20 (L) 09/01/2020 0342   CHOLHDL 3.9 09/01/2020 0342   VLDL 10 09/01/2020 0342  LDLCALC 47 09/01/2020 0342     Lab Results  Component Value Date   TSH 0.264 (L) 05/01/2023   TSH 4.240 01/09/2023   TSH 2.020 09/08/2022   TSH 0.184 (L) 03/02/2022   TSH 3.110 09/29/2021   TSH 56.600 (H) 07/16/2021   TSH 98.600 (H) 04/13/2021   TSH  0.012 (L) 02/05/2021   TSH 0.01 (A) 11/18/2020   TSH 0.029 (L) 10/09/2020   FREET4 1.46 05/01/2023   FREET4 1.25 01/09/2023   FREET4 1.37 09/08/2022   FREET4 1.53 03/02/2022   FREET4 1.73 09/29/2021   FREET4 0.92 07/16/2021   FREET4 0.22 (L) 04/13/2021   FREET4 1.01 02/05/2021   FREET4 0.73 10/09/2020   FREET4 0.78 10/08/2020      Latest Reference Range & Units 07/16/21 16:53 09/29/21 14:34 03/02/22 14:07 09/08/22 14:28 01/09/23 13:14 05/01/23 14:29  TSH 0.450 - 4.500 uIU/mL 56.600 (H) 3.110 0.184 (L) 2.020 4.240 0.264 (L)  T4,Free(Direct) 0.82 - 1.77 ng/dL 6.21 3.08 6.57 8.46 9.62 1.46  (H): Data is abnormally high (L): Data is abnormally low  Assessment & Plan:   1. 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    Her previsit thyroid function tests are consistent with appropriate hormone replacement (TSH slightly suppressed but FT4 WNL and she denies symptoms of over-replacement).  She is advised to continue her Levothyroxine 100 mcg po daily before breakfast.     - The correct intake of thyroid hormone (Levothyroxine, Synthroid), is on empty stomach first thing in the morning, with water, separated by at least 30 minutes from breakfast and other medications,  and separated by more than 4 hours from calcium, iron, multivitamins, acid reflux medications (PPIs).  - This medication is a life-long medication and will be needed to correct thyroid hormone imbalances for the rest of your life.  The dose may change from time to time, based on thyroid blood work.  - It is extremely important to be consistent taking this medication, near the same time each morning.  -AVOID TAKING PRODUCTS CONTAINING BIOTIN (commonly found in Hair, Skin, Nails vitamins) AS IT INTERFERES WITH THE VALIDITY OF THYROID FUNCTION BLOOD TESTS.  2. Weight management She is asking about weight loss medications today.  Her activity level is significantly  limited due to other medical issues and would certainly benefit from weight reduction.  She denies personal or family history of thyroid cancer, no hx of pancreatitis.   I discussed and initiated Zepbound 2.5 mg SQ weekly x 1 month, then increase to 5 mg SQ weekly if tolerated well.  We did discuss potential side effects of that medication and ways to reduce the chances with proper diet.   -Patient is advised to maintain close follow up with Waldon Reining, MD for primary care needs.     I spent  32  minutes in the care of the patient today including review of labs from Thyroid Function, CMP, and other relevant labs ; imaging/biopsy records (current and previous including abstractions from other facilities); face-to-face time discussing  her lab results and symptoms, medications doses, her options of short and long term treatment based on the latest standards of care / guidelines;   and documenting the encounter.  Maximino Greenland Grawe  participated in the discussions, expressed understanding, and voiced agreement with the above plans.  All questions were answered to her satisfaction. she is encouraged to contact clinic should she have any questions or concerns prior to  her return visit.     Follow up plan: Return in about 3 months (around 08/04/2023) for Thyroid follow up, Previsit labs.  Ronny Bacon, [facility] [address] [place], [place] [address] Phone: [phone] Fax: [phone]  05/04/2023, 11:59 AM

## 2023-05-08 ENCOUNTER — Telehealth: Payer: Self-pay | Admitting: Nurse Practitioner

## 2023-05-08 NOTE — Telephone Encounter (Signed)
Pt states that Zepbound is not covered through insurance.

## 2023-05-09 NOTE — Telephone Encounter (Signed)
Ok, noted.  She was aware that may happen.

## 2023-05-09 NOTE — Telephone Encounter (Signed)
Pt notified. She will contact insurance to check on any alternatives

## 2023-05-09 NOTE — Telephone Encounter (Signed)
Can someone call and relay this to her in case she has questions about other medications

## 2023-05-09 NOTE — Telephone Encounter (Signed)
No, unfortunately if they do not cover one weight loss medication, they will not approve for any of them.  She can call her insurance and double check to be sure though.

## 2023-05-09 NOTE — Telephone Encounter (Signed)
Pt was curious if there were other options

## 2023-05-29 ENCOUNTER — Other Ambulatory Visit (HOSPITAL_COMMUNITY)
Admission: RE | Admit: 2023-05-29 | Discharge: 2023-05-29 | Disposition: A | Discharge status: Home / Self Care | Payer: Medicare Other | Source: Ambulatory Visit | Attending: Obstetrics and Gynecology | Admitting: Obstetrics and Gynecology

## 2023-05-29 ENCOUNTER — Ambulatory Visit: Payer: Medicare Other | Admitting: Obstetrics and Gynecology

## 2023-05-29 ENCOUNTER — Encounter: Payer: Self-pay | Admitting: Obstetrics and Gynecology

## 2023-05-29 VITALS — BP 113/74 | HR 82 | Ht 63.0 in | Wt 193.0 lb

## 2023-05-29 DIAGNOSIS — Z124 Encounter for screening for malignant neoplasm of cervix: Secondary | ICD-10-CM | POA: Diagnosis not present

## 2023-05-29 DIAGNOSIS — D1779 Benign lipomatous neoplasm of other sites: Secondary | ICD-10-CM

## 2023-05-29 DIAGNOSIS — Z1151 Encounter for screening for human papillomavirus (HPV): Secondary | ICD-10-CM | POA: Insufficient documentation

## 2023-05-29 DIAGNOSIS — Z01419 Encounter for gynecological examination (general) (routine) without abnormal findings: Secondary | ICD-10-CM | POA: Insufficient documentation

## 2023-05-29 DIAGNOSIS — Z1231 Encounter for screening mammogram for malignant neoplasm of breast: Secondary | ICD-10-CM

## 2023-05-29 MED ORDER — TRIAMCINOLONE ACETONIDE 0.5 % EX OINT: 1.0000 | TOPICAL_OINTMENT | Freq: Every evening | CUTANEOUS | 0 refills | Status: DC

## 2023-05-29 NOTE — Patient Instructions (Addendum)
Please call your primary care doctor to request a pre operative appointment/clearance.   You will hear from my surgery scheduler in the next couple of weeks. Please call if you have any questions or concerns.  I sent the steroid ointment to your pharmacy. Use a thin layer at night for 2 weeks to see if that helps your itching go away.

## 2023-05-29 NOTE — Progress Notes (Unsigned)
Pt has bump like area on vulva x 1 year.  Pt states it is irritating, no draining or d/c from it.  Pt states last pap approx 1 year ago with referring provider.

## 2023-05-31 NOTE — Progress Notes (Signed)
NEW GYNECOLOGY VISIT  Subjective:  Candace Robinson is a menopausal 57 y.o. presenting for evaluation of vulvar lesion  Lump on right side of vulva that has been present for a year. Itchy and irritating due to location. Tries to avoid scratching the area but it is very bothersome. Has never had something like this before. It does not drain fluid.  No fevers/chills, ulceration, discharge.   Mobility limited  Past Medical History:  Diagnosis Date   Anemia    Atrial fibrillation (HCC)    in setting of hyperthyroidism   CHF (congestive heart failure) (HCC)    Graves disease    Heart murmur    mild AS 02/15/21 echo [facility])   Hypertension    Hyperthyroidism    throtoxicosis 08/2020; s/p RAI therapy 12/11/20 for Graves' Disease   Hypothyroidism    post-RAI therapy   Lupus    cutaneous discoid lupus   Past Surgical History:  Procedure Laterality Date   ABLATION Right    approximately 2015. right lower leg   COLONOSCOPY  09/18/2015   in [place]-normal exam per pt   FOOT SURGERY  04/2020   debridement   TONSILLECTOMY     age 25   Current Outpatient Medications on File Prior to Visit  Medication Sig Dispense Refill   amLODipine (NORVASC) 5 MG tablet Take 5 mg by mouth daily.     ASPIRIN LOW DOSE 81 MG EC tablet Take 81 mg by mouth daily.     atorvastatin (LIPITOR) 20 MG tablet Take 20 mg by mouth daily.     doxycycline (VIBRA-TABS) 100 MG tablet Take 100 mg by mouth daily.     ferrous sulfate 325 (65 FE) MG tablet Take 1 tablet (325 mg total) by mouth in the morning and at bedtime. (Patient taking differently: Take 325 mg by mouth daily.) 60 tablet 0   hydrOXYzine (ATARAX/VISTARIL) 25 MG tablet Take 25 mg by mouth every 8 (eight) hours as needed for itching.     levothyroxine (SYNTHROID) 100 MCG tablet Take 1 tablet (100 mcg total) by mouth daily before breakfast. 90 tablet 1   lisinopril (ZESTRIL) 10 MG tablet Take 10 mg by mouth daily.     Multiple Vitamins-Minerals  (CENTRUM SILVER 50+WOMEN) TABS Take 1 tablet by mouth daily.     mycophenolate (CELLCEPT) 500 MG tablet Take 500 mg by mouth 2 (two) times daily.     omeprazole (PRILOSEC) 20 MG capsule Take 20 mg by mouth 2 (two) times daily.     ondansetron (ZOFRAN) 4 MG tablet Take 4 mg by mouth every 8 (eight) hours as needed for vomiting or nausea.     ondansetron (ZOFRAN-ODT) 4 MG disintegrating tablet Take 1 tablet (4 mg total) by mouth every 8 (eight) hours as needed for nausea or vomiting. 20 tablet 0   pentoxifylline (TRENTAL) 400 MG CR tablet Take 400 mg by mouth 2 (two) times daily.     predniSONE (DELTASONE) 5 MG tablet Take 10 mg by mouth daily.     sulfamethoxazole-trimethoprim (BACTRIM DS) 800-160 MG tablet Take 1 tablet by mouth 2 (two) times daily. (Patient not taking: Reported on 05/04/2023)     tacrolimus (PROTOPIC) 0.1 % ointment Apply 1 application topically daily as needed (lupus skin irritation). (Patient not taking: Reported on 05/04/2023)     tirzepatide (ZEPBOUND) 2.5 MG/0.5ML Pen Inject 2.5 mg into the skin once a week. 2 mL 0   tirzepatide (ZEPBOUND) 5 MG/0.5ML Pen Inject 5 mg into the skin  once a week. 6 mL 0   traMADol HCl 100 MG TABS Take 100 mg by mouth every 8 (eight) hours as needed (pain).     zinc gluconate 50 MG tablet Take 50 mg by mouth daily.     No current facility-administered medications on file prior to visit.   Social History   Socioeconomic History   Marital status: Single    Spouse name: Not on file   Number of children: Not on file   Years of education: Not on file   Highest education level: Not on file  Occupational History   Not on file  Tobacco Use   Smoking status: Never   Smokeless tobacco: Never  Vaping Use   Vaping status: Never Used  Substance and Sexual Activity   Alcohol use: Not Currently   Drug use: Not Currently   Sexual activity: Not Currently    Birth control/protection: None  Other Topics Concern   Not on file  Social History  Narrative   Not on file   Social Determinants of Health   Financial Resource Strain: Not on file  Food Insecurity: No Food Insecurity (04/27/2020)   Received from [facility], [facility]   Hunger Vital Sign    Worried About Running Out of Food in the Last Year: Never true    Ran Out of Food in the Last Year: Never true  Transportation Needs: No Transportation Needs (04/27/2020)   Received from [facility], [facility]   [facility] - Transportation    Lack of Transportation (Medical): No    Lack of Transportation (Non-Medical): No  Physical Activity: Inactive (04/27/2020)   Received from [facility], [facility]   Exercise Vital Sign    Days of Exercise per Week: 0 days    Minutes of Exercise per Session: 0 min  Stress: Not on file  Social Connections: Not on file  Intimate Partner Violence: Not on file    Objective:   Vitals:   05/29/23 1532  BP: 113/74  Pulse: 82  Weight: 193 lb (87.5 kg)  Height: 5\' 3"  (1.6 m)    General:  Alert, oriented and cooperative. Patient is in no acute distress.  Skin: Skin is warm and dry. No rash noted.   Cardiovascular: Normal heart rate noted  Respiratory: Normal respiratory effort, no problems with respiration noted  Abdomen: Soft, non-tender, non-distended   Pelvic:  3x2cm soft, mobile, well circumscribed mass in the right vulva. Non-fluctuant. No overlying skin changes.  Cervix visually normal  Exam performed in the presence of a chaperone  Assessment and Plan:  Candace Robinson is a 57 y.o. 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 Lipoma of other specified sites - Exam findings seem most consistent with a lipoma. Given that the area is itchy/symptomatic and menopausal status, reviewed option for removal in OR for both symptom relief and pathology evaluation.  - Trial topical steroids x 2 weeks to see if her itching is more related to contact dermatitis. If she gets relief in her symptoms, we can consider  surveillance of what is likely a lipoma - Discussed risks of surgery including inadequate relief in symptoms, pain, infection, bleeding, change in appearance of vulva, wound breakdown, need for additional procedures, VTE, anesthesia reaction and medical complications.  - Given her medical history, she will need pre op clearance/optimization with her PCP  -     Ambulatory Referral For Surgery Scheduling -     triamcinolone ointment (KENALOG) 0.5 %;  Apply 1 Application topically at bedtime for 14 days.  2. Cervical cancer screening - Cytology - PAP( [place])  3. Encounter for screening mammogram for malignant neoplasm of breast - MM 3D SCREENING MAMMOGRAM BILATERAL BREAST; Future  Future Appointments  Date Time Provider Department Center  07/06/2023 11:40 AM [facility] MM 3 [facility] GI-BREAST CE  08/10/2023 11:00 AM Dani Gobble, NP [facility] None    Lennart Pall, MD

## 2023-06-02 LAB — CYTOLOGY - PAP
Comment: NEGATIVE
Diagnosis: NEGATIVE
Diagnosis: REACTIVE
High risk HPV: NEGATIVE

## 2023-06-29 ENCOUNTER — Other Ambulatory Visit: Payer: Self-pay | Admitting: Emergency Medicine

## 2023-06-29 ENCOUNTER — Encounter: Payer: Self-pay | Admitting: Emergency Medicine

## 2023-06-29 MED ORDER — TRIAMCINOLONE ACETONIDE 0.5 % EX OINT: 1.0000 | TOPICAL_OINTMENT | Freq: Every evening | CUTANEOUS | 0 refills | Status: AC

## 2023-06-29 NOTE — Progress Notes (Signed)
Pt states Rx was sent to wrong pharmacy and never received. Resending Rx today

## 2023-07-06 ENCOUNTER — Ambulatory Visit: Payer: Medicare Other

## 2023-07-12 ENCOUNTER — Telehealth: Payer: Self-pay

## 2023-07-12 NOTE — Telephone Encounter (Signed)
 Called patient to schedule procedure w/ Dr. Erik. Patient is available on 08/21/23 at 10 am and will arrive at [facility] by 8 am. Pre-op instructions and surgery details were provided by mail. Patient is also aware that written details will be sent to her Mychart once finalized.

## 2023-07-13 ENCOUNTER — Ambulatory Visit
Admission: RE | Admit: 2023-07-13 | Discharge: 2023-07-13 | Disposition: A | Discharge status: Home / Self Care | Payer: Medicare Other | Source: Ambulatory Visit | Attending: Obstetrics and Gynecology | Admitting: Obstetrics and Gynecology

## 2023-07-13 ENCOUNTER — Ambulatory Visit: Payer: Medicare Other

## 2023-07-13 DIAGNOSIS — Z1231 Encounter for screening mammogram for malignant neoplasm of breast: Secondary | ICD-10-CM

## 2023-07-24 ENCOUNTER — Other Ambulatory Visit: Payer: Self-pay | Admitting: Obstetrics and Gynecology

## 2023-08-04 ENCOUNTER — Telehealth: Payer: Self-pay

## 2023-08-04 NOTE — Telephone Encounter (Signed)
The patient left a voicemail stating her PCP [facility]) has not receive the surgery clearance request. Patient asked that we fax the request to [phone]. I called back to confirm the information was provided to Dr. Berton Lan and our clinical team.

## 2023-08-07 ENCOUNTER — Telehealth: Payer: Self-pay | Admitting: Obstetrics and Gynecology

## 2023-08-08 ENCOUNTER — Other Ambulatory Visit: Payer: Self-pay | Admitting: Obstetrics and Gynecology

## 2023-08-08 NOTE — Progress Notes (Signed)
Letter entered requesting pre op clearance from PCP

## 2023-08-09 NOTE — Patient Instructions (Signed)

## 2023-08-10 ENCOUNTER — Ambulatory Visit (INDEPENDENT_AMBULATORY_CARE_PROVIDER_SITE_OTHER): Payer: Medicare Other | Admitting: Nurse Practitioner

## 2023-08-10 ENCOUNTER — Encounter: Payer: Self-pay | Admitting: Nurse Practitioner

## 2023-08-10 VITALS — BP 122/86 | HR 84 | Ht 63.0 in | Wt 209.4 lb

## 2023-08-10 DIAGNOSIS — E890 Postprocedural hypothyroidism: Secondary | ICD-10-CM | POA: Diagnosis not present

## 2023-08-10 LAB — COMPREHENSIVE METABOLIC PANEL
ALT: 14 [IU]/L (ref 0–32)
AST: 19 [IU]/L (ref 0–40)
Albumin: 3.6 g/dL — ABNORMAL LOW (ref 3.8–4.9)
Alkaline Phosphatase: 104 [IU]/L (ref 44–121)
BUN/Creatinine Ratio: 10 (ref 9–23)
BUN: 11 mg/dL (ref 6–24)
Bilirubin Total: <0.2 mg/dL (ref 0.0–1.2)
CO2: 20 mmol/L (ref 20–29)
Calcium: 9.6 mg/dL (ref 8.7–10.2)
Chloride: 102 mmol/L (ref 96–106)
Creatinine, Ser: 1.13 mg/dL — ABNORMAL HIGH (ref 0.57–1.00)
Globulin, Total: 4.4 g/dL (ref 1.5–4.5)
Glucose: 98 mg/dL (ref 70–99)
Potassium: 4.2 mmol/L (ref 3.5–5.2)
Sodium: 137 mmol/L (ref 134–144)
Total Protein: 8 g/dL (ref 6.0–8.5)
eGFR: 56 mL/min/{1.73_m2} — ABNORMAL LOW (ref >59)

## 2023-08-10 LAB — TSH: TSH: 0.103 u[IU]/mL — ABNORMAL LOW (ref 0.450–4.500)

## 2023-08-10 LAB — T4, FREE: Free T4: 1.34 ng/dL (ref 0.82–1.77)

## 2023-08-10 MED ORDER — LEVOTHYROXINE SODIUM 100 MCG PO TABS: 100.0000 ug | ORAL_TABLET | Freq: Every day | ORAL | 3 refills | Status: AC

## 2023-08-10 NOTE — Progress Notes (Signed)
 08/10/2023     Endocrinology Follow Up Note      Subjective:    Patient ID: Candace Robinson, female    DOB: Jun 08, 1966, PCP Vicky Berg, MD.   Past Medical History:  Diagnosis Date   Anemia    Atrial fibrillation [facility])    in setting of hyperthyroidism   CHF (congestive heart failure) (HCC)    Graves disease    Heart murmur    mild AS 02/15/21 echo [facility])   Hypertension    Hyperthyroidism    throtoxicosis 08/2020; s/p RAI therapy 12/11/20 for Graves' Disease   Hypothyroidism    post-RAI therapy   Lupus    cutaneous discoid lupus    Past Surgical History:  Procedure Laterality Date   ABLATION Right    approximately 2015. right lower leg   COLONOSCOPY  09/18/2015   in [place]-normal exam per pt   FOOT SURGERY  04/2020   debridement   TONSILLECTOMY     age 58    Social History   Socioeconomic History   Marital status: Single    Spouse name: Not on file   Number of children: Not on file   Years of education: Not on file   Highest education level: Not on file  Occupational History   Not on file  Tobacco Use   Smoking status: Never   Smokeless tobacco: Never  Vaping Use   Vaping status: Never Used  Substance and Sexual Activity   Alcohol use: Not Currently   Drug use: Not Currently   Sexual activity: Not Currently    Birth control/protection: None  Other Topics Concern   Not on file  Social History Narrative   Not on file   Social Drivers of Health   Financial Resource Strain: Not on file  Food Insecurity: No Food Insecurity (04/27/2020)   Received from [facility], [facility]   Hunger Vital Sign    Worried About Running Out of Food in the Last Year: Never true    Ran Out of Food in the Last Year: Never true  Transportation Needs: No Transportation Needs (04/27/2020)   Received from [facility], [facility]   [facility] - Transportation    Lack of Transportation (Medical): No    Lack of Transportation  (Non-Medical): No  Physical Activity: Inactive (04/27/2020)   Received from [facility], [facility]   Exercise Vital Sign    Days of Exercise per Week: 0 days    Minutes of Exercise per Session: 0 min  Stress: Not on file  Social Connections: Not on file    Family History  Problem Relation Age of Onset   Hypertension Mother    Diabetes Mother    COPD Mother    Glaucoma Mother    Colon cancer Father        unsure age around 4 perp t   Cancer - Colon Father    Diabetes Sister    Glaucoma Sister    Uveitis Daughter    Lupus Cousin        systemic   Lupus Cousin        systemic   Lupus Cousin        cutaneous   Stomach cancer Maternal Great-grandmother    Colon polyps Neg Hx    Esophageal cancer Neg Hx    Rectal cancer Neg Hx     Outpatient Encounter Medications as of 08/10/2023  Medication Sig   amLODipine (  NORVASC) 5 MG tablet Take 5 mg by mouth daily.   ASPIRIN LOW DOSE 81 MG EC tablet Take 81 mg by mouth daily.   atorvastatin (LIPITOR) 20 MG tablet Take 20 mg by mouth daily.   ferrous sulfate  325 (65 FE) MG tablet Take 1 tablet (325 mg total) by mouth in the morning and at bedtime. (Patient taking differently: Take 325 mg by mouth daily.)   hydrOXYzine (ATARAX/VISTARIL) 25 MG tablet Take 25 mg by mouth every 8 (eight) hours as needed for itching.   lisinopril (ZESTRIL) 10 MG tablet Take 10 mg by mouth daily.   Multiple Vitamins-Minerals (CENTRUM SILVER  50+WOMEN) TABS Take 1 tablet by mouth daily.   mycophenolate (CELLCEPT) 500 MG tablet Take 500 mg by mouth 2 (two) times daily.   omeprazole  (PRILOSEC) 20 MG capsule Take 20 mg by mouth 2 (two) times daily.   ondansetron  (ZOFRAN ) 4 MG tablet Take 4 mg by mouth every 8 (eight) hours as needed for vomiting or nausea.   ondansetron  (ZOFRAN -ODT) 4 MG disintegrating tablet Take 1 tablet (4 mg total) by mouth every 8 (eight) hours as needed for nausea or vomiting.   pentoxifylline (TRENTAL) 400 MG CR tablet Take 400  mg by mouth 2 (two) times daily.   predniSONE (DELTASONE) 5 MG tablet Take 10 mg by mouth daily.   sulfamethoxazole-trimethoprim (BACTRIM DS) 800-160 MG tablet Take 1 tablet by mouth 2 (two) times daily.   traMADol HCl 100 MG TABS Take 100 mg by mouth every 8 (eight) hours as needed (pain).   zinc  gluconate 50 MG tablet Take 50 mg by mouth daily.   [DISCONTINUED] levothyroxine  (SYNTHROID ) 100 MCG tablet Take 1 tablet (100 mcg total) by mouth daily before breakfast.   levothyroxine  (SYNTHROID ) 100 MCG tablet Take 1 tablet (100 mcg total) by mouth daily before breakfast.   tacrolimus (PROTOPIC) 0.1 % ointment Apply 1 application topically daily as needed (lupus skin irritation). (Patient not taking: Reported on 05/04/2023)   [DISCONTINUED] tirzepatide  (ZEPBOUND ) 2.5 MG/0.5ML Pen Inject 2.5 mg into the skin once a week. (Patient not taking: Reported on 08/10/2023)   [DISCONTINUED] tirzepatide  (ZEPBOUND ) 5 MG/0.5ML Pen Inject 5 mg into the skin once a week. (Patient not taking: Reported on 08/10/2023)   No facility-administered encounter medications on file as of 08/10/2023.    ALLERGIES: Allergies  Allergen Reactions   Ibuprofen Other (See Comments)    hallucinations Other reaction(s): Delusions (intolerance), Other (See Comments) hallucinations    VACCINATION STATUS: Immunization History  Administered Date(s) Administered   PFIZER(Purple Top)SARS-COV-2 Vaccination 09/12/2019, 06/30/2020     HPI  Candace Robinson is 58 y.o. female who presents today with a medical history as above. she is being seen in follow up after being seen in consultation for hyperthyroidism requested by Vicky Berg, MD.  she was recently hospitalized for complications with nonhealing wounds on her bilateral feet.  She developed altered mental status and thus thyroid  labs were performed showing severely suppressed TSH and high Free T3 levels, in impending thyroid  storm.  She was treated with high dose of  Methimazole  30 mg po daily and sent to the [facility] for rehabilitation where she has since been sent home.  She also had baseline thyroid  ultrasound which did not show any suspicious nodules.  she currently denies dysphagia, choking, shortness of breath, no recent voice change.    she denies family history of thyroid  dysfunction and denies family hx of thyroid  cancer. she denies personal history of goiter. she is on  any anti-thyroid  medications and propanolol for symptom management. Denies use of Biotin containing supplements.    Once her thyroid  labs stabilized, we were able to taper and stop her Methimazole  and proceed with NM uptake and scan which confirmed Graves disease.  She is s/p RAI therapy on 12/11/20.  She has since started on thyroid  hormone replacement therapy.   Review of systems  Constitutional: + fluctuating body weight,  current Body mass index is 37.09 kg/m. , no fatigue, no subjective hyperthermia, no subjective hypothermia Eyes: no blurry vision, no xerophthalmia ENT: no sore throat, no nodules palpated in throat, no dysphagia/odynophagia, no hoarseness Cardiovascular: no chest pain, no shortness of breath, no palpitations, no leg swelling Respiratory: no cough, no shortness of breath Gastrointestinal: no nausea/vomiting/diarrhea Musculoskeletal: walks with walker Skin: no rashes, no hyperemia, bilateral legs in compression wraps Neurological: no tremors, no numbness, no tingling, no dizziness Psychiatric: no depression, no anxiety   Objective:    BP 122/86 (BP Location: Right Arm, Patient Position: Sitting, Cuff Size: Large)   Pulse 84   Ht 5' 3 (1.6 m)   Wt 209 lb 6.4 oz (95 kg)   BMI 37.09 kg/m   Wt Readings from Last 3 Encounters:  08/10/23 209 lb 6.4 oz (95 kg)  05/29/23 193 lb (87.5 kg)  05/04/23 196 lb (88.9 kg)     BP Readings from Last 3 Encounters:  08/10/23 122/86  05/29/23 113/74  05/04/23 101/63     Physical Exam-  Limited  Constitutional:  Body mass index is 37.09 kg/m. , not in acute distress, normal state of mind Eyes:  EOMI, no exophthalmos Musculoskeletal: no gross deformities, strength intact in all four extremities, no gross restriction of joint movements, walks with walker Skin:  no rashes, no hyperemia Neurological: no tremor with outstretched hands   CMP     Component Value Date/Time   NA 137 08/09/2023 1642   K 4.2 08/09/2023 1642   CL 102 08/09/2023 1642   CO2 20 08/09/2023 1642   GLUCOSE 98 08/09/2023 1642   GLUCOSE 93 08/25/2021 1131   BUN 11 08/09/2023 1642   CREATININE 1.13 (H) 08/09/2023 1642   CREATININE 1.80 (H) 07/06/2021 1111   CALCIUM  9.6 08/09/2023 1642   PROT 8.0 08/09/2023 1642   ALBUMIN 3.6 (L) 08/09/2023 1642   AST 19 08/09/2023 1642   ALT 14 08/09/2023 1642   ALKPHOS 104 08/09/2023 1642   BILITOT <0.2 08/09/2023 1642   GFRNONAA >60 08/25/2021 1131     CBC    Component Value Date/Time   WBC 3.8 (L) 08/25/2021 1131   RBC 2.92 (L) 08/25/2021 1131   HGB 7.3 (L) 08/25/2021 1131   HCT 25.1 (L) 08/25/2021 1131   PLT 279 08/25/2021 1131   MCV 86.0 08/25/2021 1131   MCH 25.0 (L) 08/25/2021 1131   MCHC 29.1 (L) 08/25/2021 1131   RDW 18.7 (H) 08/25/2021 1131   LYMPHSABS 783 (L) 07/06/2021 1111   MONOABS 0.4 09/12/2020 0329   EOSABS 17 07/06/2021 1111   BASOSABS 17 07/06/2021 1111     Diabetic Labs (most recent): Lab Results  Component Value Date   HGBA1C 5.2 08/25/2021   HGBA1C 5.6 09/01/2020    Lipid Panel     Component Value Date/Time   CHOL 77 09/01/2020 0342   TRIG 52 09/01/2020 0342   HDL 20 (L) 09/01/2020 0342   CHOLHDL 3.9 09/01/2020 0342   VLDL 10 09/01/2020 0342   LDLCALC 47 09/01/2020 0342     Lab  Results  Component Value Date   TSH 0.103 (L) 08/09/2023   TSH 0.264 (L) 05/01/2023   TSH 4.240 01/09/2023   TSH 2.020 09/08/2022   TSH 0.184 (L) 03/02/2022   TSH 3.110 09/29/2021   TSH 56.600 (H) 07/16/2021   TSH 98.600 (H)  04/13/2021   TSH 0.012 (L) 02/05/2021   TSH 0.01 (A) 11/18/2020   FREET4 1.34 08/09/2023   FREET4 1.46 05/01/2023   FREET4 1.25 01/09/2023   FREET4 1.37 09/08/2022   FREET4 1.53 03/02/2022   FREET4 1.73 09/29/2021   FREET4 0.92 07/16/2021   FREET4 0.22 (L) 04/13/2021   FREET4 1.01 02/05/2021   FREET4 0.73 10/09/2020      Latest Reference Range & Units 09/29/21 14:34 03/02/22 14:07 09/08/22 14:28 01/09/23 13:14 05/01/23 14:29 08/09/23 16:42  TSH 0.450 - 4.500 uIU/mL 3.110 0.184 (L) 2.020 4.240 0.264 (L) 0.103 (L)  T4,Free(Direct) 0.82 - 1.77 ng/dL 8.26 8.46 8.62 8.74 8.53 1.34  (L): Data is abnormally low  Assessment & Plan:   1. 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    Her previsit thyroid  function tests are consistent with appropriate hormone replacement (TSH slightly suppressed but FT4 WNL and she denies symptoms of over-replacement).  She is advised to continue her Levothyroxine  100 mcg po daily before breakfast.   - The correct intake of thyroid  hormone (Levothyroxine , Synthroid ), is on empty stomach first thing in the morning, with water, separated by at least 30 minutes from breakfast and other medications,  and separated by more than 4 hours from calcium , iron, multivitamins, acid reflux medications (PPIs).  - This medication is a life-long medication and will be needed to correct thyroid  hormone imbalances for the rest of your life.  The dose may change from time to time, based on thyroid  blood work.  - It is extremely important to be consistent taking this medication, near the same time each morning.  -AVOID TAKING PRODUCTS CONTAINING BIOTIN (commonly found in Hair, Skin, Nails vitamins) AS IT INTERFERES WITH THE VALIDITY OF THYROID  FUNCTION BLOOD TESTS.  2. Weight management She is asking about weight loss medications today.  Her activity level is significantly limited due to other medical issues and  would certainly benefit from weight reduction.  She denies personal or family history of thyroid  cancer, no hx of pancreatitis.   I discussed and initiated Zepbound  2.5 mg at last visit, unfortunately her insurance denied this medication as it does not cover any weight loss medication.   -Patient is advised to maintain close follow up with Vicky Berg, MD for primary care needs.    I spent  24  minutes in the care of the patient today including review of labs from Thyroid  Function, CMP, and other relevant labs ; imaging/biopsy records (current and previous including abstractions from other facilities); face-to-face time discussing  her lab results and symptoms, medications doses, her options of short and long term treatment based on the latest standards of care / guidelines;   and documenting the encounter.  Candace Helling Victorino  participated in the discussions, expressed understanding, and voiced agreement with the above plans.  All questions were answered to her satisfaction. she is encouraged to contact clinic should she have any questions or concerns prior to her return visit.     Follow up plan: Return in about 4 months (around 12/08/2023) for Thyroid  follow up, Previsit labs.  Benton Rio, FNP-BC [facility] [address]  [address] [place], [place] [address] Phone: [phone] Fax: [phone]  08/10/2023, 11:20 AM

## 2023-08-15 NOTE — Progress Notes (Signed)
Surgical Instructions    Your procedure is scheduled on August 21, 2023.  Report to [facility] Main Entrance "A" at 11:55 A.M., then check in with the Admitting office.  Call this number if you have problems the morning of surgery:  [phone]  If you have any questions prior to your surgery date call [phone]: Open Monday-Friday 8am-4pm If you experience any cold or flu symptoms such as cough, fever, chills, shortness of breath, etc. between now and your scheduled surgery, please notify us at the above number.     Remember:  Do not eat or drink after midnight the night before your surgery      Take these medicines the morning of surgery with A SIP OF WATER  amLODipine (NORVASC)  atorvastatin (LIPITOR)  levothyroxine (SYNTHROID)  mycophenolate (CELLCEPT)  omeprazole (PRILOSEC)  pentoxifylline (TRENTAL)  predniSONE (DELTASONE)  traMADol  IF NEEDED hydrOXYzine (ATARAX/VISTARIL)  ondansetron (ZOFRAN)     As of today, STOP taking any Aspirin (unless otherwise instructed by your surgeon) Aleve, Naproxen, Ibuprofen, Motrin, Advil, Goody's, BC's, all herbal medications, fish oil, and all vitamins.                     Do NOT Smoke (Tobacco/Vaping) for 24 hours prior to your procedure.  If you use a CPAP at night, you may bring your mask/headgear for your overnight stay.   Contacts, glasses, piercing's, hearing aid's, dentures or partials may not be worn into surgery, please bring cases for these belongings.    For patients admitted to the hospital, discharge time will be determined by your treatment team.   Patients discharged the day of surgery will not be allowed to drive home, and someone needs to stay with them for 24 hours.  SURGICAL WAITING ROOM VISITATION Patients having surgery or a procedure may have no more than 2 support people in the waiting area - these visitors may rotate.   Children under the age of 21 must have an adult with them who is not the  patient. If the patient needs to stay at the hospital during part of their recovery, the visitor guidelines for inpatient rooms apply. Pre-op nurse will coordinate an appropriate time for 1 support person to accompany patient in pre-op.  This support person may not rotate.   Please refer to the [facility] website for the visitor guidelines for Inpatients (after your surgery is over and you are in a regular room).    Special instructions:   [facility]- Preparing For Surgery  Before surgery, you can play an important role. Because skin is not sterile, your skin needs to be as free of germs as possible. You can reduce the number of germs on your skin by washing with CHG (chlorahexidine gluconate) Soap before surgery.  CHG is an antiseptic cleaner which kills germs and bonds with the skin to continue killing germs even after washing.    Oral Hygiene is also important to reduce your risk of infection.  Remember - BRUSH YOUR TEETH THE MORNING OF SURGERY WITH YOUR REGULAR TOOTHPASTE  Please do not use if you have an allergy to CHG or antibacterial soaps. If your skin becomes reddened/irritated stop using the CHG.  Do not shave (including legs and underarms) for at least 48 hours prior to first CHG shower. It is OK to shave your face.  Please follow these instructions carefully.   Shower the NIGHT BEFORE SURGERY and the MORNING OF SURGERY  If you chose to wash your hair,  wash your hair first as usual with your normal shampoo.  After you shampoo, rinse your hair and body thoroughly to remove the shampoo.  Use CHG Soap as you would any other liquid soap. You can apply CHG directly to the skin and wash gently with a scrungie or a clean washcloth.   Apply the CHG Soap to your body ONLY FROM THE NECK DOWN.  Do not use on open wounds or open sores. Avoid contact with your eyes, ears, mouth and genitals (private parts). Wash Face and genitals (private parts)  with your normal soap.   Wash thoroughly,  paying special attention to the area where your surgery will be performed.  Thoroughly rinse your body with warm water from the neck down.  DO NOT shower/wash with your normal soap after using and rinsing off the CHG Soap.  Pat yourself dry with a CLEAN TOWEL.  Wear CLEAN PAJAMAS to bed the night before surgery  Place CLEAN SHEETS on your bed the night before your surgery  DO NOT SLEEP WITH PETS.   Day of Surgery: Take a shower with CHG soap. Do not wear jewelry or makeup Do not wear lotions, powders, perfumes/colognes, or deodorant. Do not shave 48 hours prior to surgery.  Men may shave face and neck. Do not bring valuables to the hospital.  [facility] is not responsible for any belongings or valuables. Do not wear nail polish, gel polish, artificial nails, or any other type of covering on natural nails (fingers and toes) If you have artificial nails or gel coating that need to be removed by a nail salon, please have this removed prior to surgery. Artificial nails or gel coating may interfere with anesthesia's ability to adequately monitor your vital signs. Wear Clean/Comfortable clothing the morning of surgery Remember to brush your teeth WITH YOUR REGULAR TOOTHPASTE.   Please read over the following fact sheets that you were given.    If you received a COVID test during your pre-op visit  it is requested that you wear a mask when out in public, stay away from anyone that may not be feeling well and notify your surgeon if you develop symptoms. If you have been in contact with anyone that has tested positive in the last 10 days please notify you surgeon.

## 2023-08-16 ENCOUNTER — Encounter (HOSPITAL_COMMUNITY): Payer: Self-pay

## 2023-08-16 ENCOUNTER — Other Ambulatory Visit: Payer: Self-pay

## 2023-08-16 ENCOUNTER — Encounter (HOSPITAL_COMMUNITY)
Admission: RE | Admit: 2023-08-16 | Discharge: 2023-08-16 | Disposition: A | Discharge status: Home / Self Care | Payer: Medicare Other | Source: Ambulatory Visit | Attending: Obstetrics and Gynecology | Admitting: Obstetrics and Gynecology

## 2023-08-16 VITALS — BP 136/69 | HR 113 | Temp 98.5°F | Resp 19 | Ht 62.0 in | Wt 209.6 lb

## 2023-08-16 DIAGNOSIS — I4891 Unspecified atrial fibrillation: Secondary | ICD-10-CM | POA: Diagnosis not present

## 2023-08-16 DIAGNOSIS — I350 Nonrheumatic aortic (valve) stenosis: Secondary | ICD-10-CM | POA: Insufficient documentation

## 2023-08-16 DIAGNOSIS — Z01818 Encounter for other preprocedural examination: Secondary | ICD-10-CM | POA: Insufficient documentation

## 2023-08-16 NOTE — Progress Notes (Signed)
PCP - Dr. Waldon Reining Cardiologist - Dr. Hubert Azure Medical clearance on 08/15/23. Hardcopy in chart.   PPM/ICD - Denies Device Orders - n/a Rep Notified - n/a  Chest x-ray - n/a EKG - 08-16-23 Stress Test - 11-18-14 ECHO - 02-15-21 Cardiac Cath - denies  Sleep Study - denies CPAP - n/a  Fasting Blood Sugar - denies Checks Blood Sugar _____ times a day n/a  Last dose of GLP1 agonist-  denies GLP1 instructions: n/a  Blood Thinner Instructions:denies Aspirin Instructions: patient stopped taking and last dose was 08/15/23  ERAS Protcol - NPO PRE-SURGERY Ensure or G2- n/a  COVID TEST- No   Anesthesia review: Yes, hx of a-fib, murmur, HTN, CHF,   Patient denies shortness of breath, fever, cough and chest pain at PAT appointment. Patient denies any respiratory issues at this time.    All instructions explained to the patient, with a verbal understanding of the material. Patient agrees to go over the instructions while at home for a better understanding. Patient also instructed to self quarantine after being tested for COVID-19. The opportunity to ask questions was provided.

## 2023-08-17 ENCOUNTER — Encounter: Payer: Self-pay | Admitting: Obstetrics and Gynecology

## 2023-08-17 NOTE — Anesthesia Preprocedure Evaluation (Addendum)
Anesthesia Evaluation    Airway        Dental   Pulmonary           Cardiovascular hypertension, Pt. on medications + dysrhythmias (transient AF when was thyrotoxic) Atrial Fibrillation   '22 ECHO: EF 50 to 55%. The left ventricle has low normal function. The  left ventricle has no regional wall motion abnormalities. Left ventricular  diastolic parameters are  indeterminate.   2. Right ventricular systolic function is normal. The right ventricular  size is normal.   3. Left atrial size was mildly dilated.   4. The mitral valve is normal in structure. Mild mitral valve  regurgitation. No evidence of mitral stenosis.   5. The aortic valve is tricuspid. There is moderate calcification of the  aortic valve. There is moderate thickening of the aortic valve. Aortic  valve regurgitation is not visualized. Mild to moderate aortic valve  stenosis.     Neuro/Psych    GI/Hepatic ,GERD  Medicated,,  Endo/Other  diabetesHypothyroidism  SLE  Renal/GU      Musculoskeletal   Abdominal   Peds  Hematology   Anesthesia Other Findings   Reproductive/Obstetrics                             Anesthesia Physical Anesthesia Plan  ASA: 3  Anesthesia Plan: General   Post-op Pain Management: Tylenol PO (pre-op)*   Induction: Intravenous  PONV Risk Score and Plan: 3 and Ondansetron, Dexamethasone and Treatment may vary due to age or medical condition  Airway Management Planned: Oral ETT  Additional Equipment: None  Intra-op Plan:   Post-operative Plan: Extubation in OR  Informed Consent:   Plan Discussed with:   Anesthesia Plan Comments: (Cancelled in preop by surgeon  PAT note by Antionette Poles, PA-C: 58 yo female previously evaluated by cardiology for mild AS (mean grad 10 mmHg on echo 02/15/21) and afib in the setting of thyrotoxicosis/storm. She ultimately underwent thyroid ablation. When last seen by  cardiologist Dr. Sharrell Ku on 11/25/21, she was noted to be maintaining sinus rhythm. No cardiac concerns at that time. She was advised to f/u with cardiology on an as needed basis and follow with PCP for monitoring of mild AS.   Other pertinent hx includes, HTN, discoid cutaneous lupus, post-ablative hypothyroid, anemia.  BMP 08/09/23 reviewed, unremarkable.   CBC 08/01/23 in care everywhere reviewed, chronic anemia with H/H 9.6/31.3, otherwise unremarkable, wbc 7.2, plt 262.   EKG 08/16/23: NSR. Rate 94.  TTE 02/15/21 (care everywhere): Summary  1. The left ventricle is normal in size with upper normal wall thickness.  2. The left ventricular systolic function is normal, LVEF is visually  estimated at 60-65%.  3. There is grade I diastolic dysfunction (impaired relaxation).  4. There is mild aortic valve stenosis.  5. The left atrium is mildly to moderately dilated in size.  6. The right ventricle is normal in size, with normal systolic function.    )        Anesthesia Quick Evaluation

## 2023-08-17 NOTE — Progress Notes (Addendum)
Anesthesia Chart Review:  58 yo female previously evaluated by cardiology for mild AS (mean grad 10 mmHg on echo 02/15/21) and afib in the setting of thyrotoxicosis/storm. She ultimately underwent thyroid ablation. When last seen by cardiologist Dr. Sharrell Ku on 11/25/21, she was noted to be maintaining sinus rhythm. No cardiac concerns at that time. She was advised to f/u with cardiology on an as needed basis and follow with PCP for monitoring of mild AS.   Other pertinent hx includes, HTN, discoid cutaneous lupus, post-ablative hypothyroid, anemia.  BMP 08/09/23 reviewed, unremarkable.   CBC 08/01/23 in care everywhere reviewed, chronic anemia with H/H 9.6/31.3, otherwise unremarkable, wbc 7.2, plt 262.   EKG 08/16/23: NSR. Rate 94.  TTE 02/15/21 (care everywhere): Summary   1. The left ventricle is normal in size with upper normal wall thickness.    2. The left ventricular systolic function is normal, LVEF is visually  estimated at 60-65%.    3. There is grade I diastolic dysfunction (impaired relaxation).    4. There is mild aortic valve stenosis.    5. The left atrium is mildly to moderately dilated in size.    6. The right ventricle is normal in size, with normal systolic function.     Zannie Cove [facility]/Anesthesiology Phone [phone] 08/17/2023 1:31 PM

## 2023-08-21 ENCOUNTER — Encounter (HOSPITAL_COMMUNITY): Payer: Self-pay | Admitting: Physician Assistant

## 2023-08-21 ENCOUNTER — Encounter (HOSPITAL_COMMUNITY): Payer: Self-pay | Admitting: Obstetrics and Gynecology

## 2023-08-21 ENCOUNTER — Ambulatory Visit (HOSPITAL_COMMUNITY)
Admission: RE | Admit: 2023-08-21 | Discharge: 2023-08-21 | Disposition: A | Discharge status: Home / Self Care | Payer: Medicare Other | Attending: Obstetrics and Gynecology | Admitting: Obstetrics and Gynecology

## 2023-08-21 ENCOUNTER — Encounter (HOSPITAL_COMMUNITY)
Admission: RE | Disposition: A | Discharge status: Home / Self Care | Payer: Self-pay | Source: Home / Self Care | Attending: Obstetrics and Gynecology

## 2023-08-21 DIAGNOSIS — Z538 Procedure and treatment not carried out for other reasons: Secondary | ICD-10-CM | POA: Diagnosis not present

## 2023-08-21 DIAGNOSIS — D1779 Benign lipomatous neoplasm of other sites: Secondary | ICD-10-CM | POA: Insufficient documentation

## 2023-08-21 SURGERY — VULVECTOMY, PARTIAL
Anesthesia: Choice

## 2023-08-21 MED ORDER — MIDAZOLAM HCL 2 MG/2ML IJ SOLN
INTRAMUSCULAR | Status: AC
  Filled 2023-08-21: qty 2

## 2023-08-21 MED ORDER — ORAL CARE MOUTH RINSE: 15.0000 mL | Freq: Once | OROMUCOSAL | Status: DC

## 2023-08-21 MED ORDER — CEFAZOLIN SODIUM-DEXTROSE 2-4 GM/100ML-% IV SOLN
2.0000 g | INTRAVENOUS | Status: DC
Start: 2023-08-21 — End: 2023-08-21
  Filled 2023-08-21: qty 100

## 2023-08-21 MED ORDER — PROPOFOL 10 MG/ML IV BOLUS
INTRAVENOUS | Status: AC
  Filled 2023-08-21: qty 20

## 2023-08-21 MED ORDER — LIDOCAINE 2% (20 MG/ML) 5 ML SYRINGE
INTRAMUSCULAR | Status: AC
  Filled 2023-08-21: qty 5

## 2023-08-21 MED ORDER — ROCURONIUM BROMIDE 10 MG/ML (PF) SYRINGE
PREFILLED_SYRINGE | INTRAVENOUS | Status: AC
  Filled 2023-08-21: qty 10

## 2023-08-21 MED ORDER — ONDANSETRON HCL 4 MG/2ML IJ SOLN
INTRAMUSCULAR | Status: AC
  Filled 2023-08-21: qty 2

## 2023-08-21 MED ORDER — FENTANYL CITRATE (PF) 250 MCG/5ML IJ SOLN
INTRAMUSCULAR | Status: AC
  Filled 2023-08-21: qty 5

## 2023-08-21 MED ORDER — DEXAMETHASONE SODIUM PHOSPHATE 10 MG/ML IJ SOLN
INTRAMUSCULAR | Status: AC
  Filled 2023-08-21: qty 1

## 2023-08-21 MED ORDER — ACETAMINOPHEN 500 MG PO TABS
1000.0000 mg | ORAL_TABLET | Freq: Once | ORAL | Status: DC
  Filled 2023-08-21: qty 2

## 2023-08-21 MED ORDER — LACTATED RINGERS IV SOLN: INTRAVENOUS | Status: DC

## 2023-08-21 MED ORDER — CHLORHEXIDINE GLUCONATE 0.12 % MT SOLN
15.0000 mL | Freq: Once | OROMUCOSAL | Status: DC
  Filled 2023-08-21: qty 15

## 2023-08-21 NOTE — Progress Notes (Signed)
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 In pre op area, patient reports she could no longer feel the lesion and is no longer symptomatic. Patient examined with RN chaperone. Normal external female genitalia. Lesion could no longer be visualized or palpated by myself or the patient. Case cancelled. Advised patient to call if symptoms return and follow up in 1 year for annual gyn exam if not needed sooner.   Harvie Bridge, MD Obstetrician & Gynecologist, [facility] for [employer], [facility]

## 2023-08-21 NOTE — Progress Notes (Signed)
Patient stated to Dr. Berton Lan that she did not feel the vulvar lesion anymore.  Dr. Berton Lan examined patient and has concluded she did not see or feel anything and surgery is not needed today.  Dr. Jean Rosenthal at bedside and aware.

## 2023-12-13 ENCOUNTER — Ambulatory Visit: Payer: Medicare Other | Admitting: Nurse Practitioner

## 2023-12-26 LAB — T4, FREE: Free T4: 1.46 ng/dL (ref 0.82–1.77)

## 2023-12-26 LAB — TSH: TSH: 0.02 u[IU]/mL — ABNORMAL LOW (ref 0.450–4.500)

## 2023-12-27 ENCOUNTER — Ambulatory Visit (INDEPENDENT_AMBULATORY_CARE_PROVIDER_SITE_OTHER): Payer: Medicare (Managed Care) | Admitting: Nurse Practitioner

## 2023-12-27 ENCOUNTER — Other Ambulatory Visit: Payer: Self-pay | Admitting: Nurse Practitioner

## 2023-12-27 ENCOUNTER — Encounter: Payer: Self-pay | Admitting: Nurse Practitioner

## 2023-12-27 VITALS — BP 108/68 | HR 84 | Ht 63.0 in | Wt 210.6 lb

## 2023-12-27 DIAGNOSIS — E890 Postprocedural hypothyroidism: Secondary | ICD-10-CM | POA: Diagnosis not present

## 2023-12-27 MED ORDER — SEMAGLUTIDE-WEIGHT MANAGEMENT 0.5 MG/0.5ML ~~LOC~~ SOAJ: 0.5000 mg | SUBCUTANEOUS | 1 refills | Status: AC

## 2023-12-27 MED ORDER — SEMAGLUTIDE-WEIGHT MANAGEMENT 2.4 MG/0.75ML ~~LOC~~ SOAJ: 2.4000 mg | SUBCUTANEOUS | 1 refills | Status: AC

## 2023-12-27 MED ORDER — SEMAGLUTIDE-WEIGHT MANAGEMENT 0.25 MG/0.5ML ~~LOC~~ SOAJ: 0.2500 mg | SUBCUTANEOUS | 0 refills | Status: AC

## 2023-12-27 MED ORDER — SEMAGLUTIDE-WEIGHT MANAGEMENT 1.7 MG/0.75ML ~~LOC~~ SOAJ: 1.7000 mg | SUBCUTANEOUS | 1 refills | Status: AC

## 2023-12-27 MED ORDER — SEMAGLUTIDE-WEIGHT MANAGEMENT 1 MG/0.5ML ~~LOC~~ SOAJ: 1.0000 mg | SUBCUTANEOUS | 1 refills | Status: AC

## 2023-12-27 NOTE — Progress Notes (Signed)
 12/27/2023     Endocrinology Follow Up Note      Subjective:    Patient ID: Candace Robinson, female    DOB: 1965-08-07, PCP Vicky Berg, MD.   Past Medical History:  Diagnosis Date   Anemia    Atrial fibrillation [facility])    in setting of hyperthyroidism   CHF (congestive heart failure) (HCC)    Graves disease    Heart murmur    mild AS 02/15/21 echo [facility])   Hypertension    Hyperthyroidism    throtoxicosis 08/2020; s/p RAI therapy 12/11/20 for Graves' Disease   Hypothyroidism    post-RAI therapy   Lupus    cutaneous discoid lupus    Past Surgical History:  Procedure Laterality Date   ABLATION Right    approximately 2015. right lower leg   COLONOSCOPY  09/18/2015   in [place]-normal exam per pt   FOOT SURGERY  04/2020   debridement   TONSILLECTOMY     age 11    Social History   Socioeconomic History   Marital status: Single    Spouse name: Not on file   Number of children: Not on file   Years of education: Not on file   Highest education level: Not on file  Occupational History   Not on file  Tobacco Use   Smoking status: Never   Smokeless tobacco: Never  Vaping Use   Vaping status: Never Used  Substance and Sexual Activity   Alcohol use: Not Currently   Drug use: Not Currently   Sexual activity: Not Currently    Birth control/protection: None  Other Topics Concern   Not on file  Social History Narrative   Not on file   Social Drivers of Health   Financial Resource Strain: Not on file  Food Insecurity: No Food Insecurity (04/27/2020)   Received from [facility]   Hunger Vital Sign    Within the past 12 months, you worried that your food would run out before you got the money to buy more.: Never true    Within the past 12 months, the food you bought just didn't last and you didn't have money to get more.: Never true  Transportation Needs: No Transportation Needs (04/27/2020)   Received from [facility]   PRAPARE -  Transportation    Lack of Transportation (Medical): No    Lack of Transportation (Non-Medical): No  Physical Activity: Inactive (04/27/2020)   Received from [facility]   Exercise Vital Sign    On average, how many days per week do you engage in moderate to strenuous exercise (like a brisk walk)?: 0 days    On average, how many minutes do you engage in exercise at this level?: 0 min  Stress: Not on file  Social Connections: Not on file    Family History  Problem Relation Age of Onset   Hypertension Mother    Diabetes Mother    COPD Mother    Glaucoma Mother    Colon cancer Father        unsure age around 27 perp t   Cancer - Colon Father    Diabetes Sister    Glaucoma Sister    Uveitis Daughter    Lupus Cousin        systemic   Lupus Cousin        systemic   Lupus Cousin        cutaneous   Stomach cancer Maternal Great-grandmother  Colon polyps Neg Hx    Esophageal cancer Neg Hx    Rectal cancer Neg Hx     Outpatient Encounter Medications as of 12/27/2023  Medication Sig   amLODipine (NORVASC) 5 MG tablet Take 5 mg by mouth daily.   atorvastatin (LIPITOR) 20 MG tablet Take 20 mg by mouth daily.   dapsone 25 MG tablet Take 25 mg by mouth daily.   ferrous sulfate  325 (65 FE) MG tablet Take 1 tablet (325 mg total) by mouth in the morning and at bedtime.   hydrOXYzine (ATARAX/VISTARIL) 25 MG tablet Take 25 mg by mouth every 8 (eight) hours as needed for itching.   levothyroxine  (SYNTHROID ) 100 MCG tablet Take 1 tablet (100 mcg total) by mouth daily before breakfast.   lisinopril (ZESTRIL) 10 MG tablet Take 10 mg by mouth daily.   mycophenolate (CELLCEPT) 500 MG tablet Take 500 mg by mouth 2 (two) times daily.   omeprazole  (PRILOSEC) 20 MG capsule Take 20 mg by mouth 2 (two) times daily.   ondansetron  (ZOFRAN -ODT) 4 MG disintegrating tablet Take 1 tablet (4 mg total) by mouth every 8 (eight) hours as needed for nausea or vomiting.   predniSONE (DELTASONE) 5 MG tablet  Take 15 mg by mouth daily. (Patient taking differently: Take 10 mg by mouth daily.)   Semaglutide-Weight Management 0.25 MG/0.5ML SOAJ Inject 0.25 mg into the skin once a week for 28 days.   [START ON 01/25/2024] Semaglutide-Weight Management 0.5 MG/0.5ML SOAJ Inject 0.5 mg into the skin once a week for 28 days.   [START ON 02/23/2024] Semaglutide-Weight Management 1 MG/0.5ML SOAJ Inject 1 mg into the skin once a week for 28 days.   [START ON 03/23/2024] Semaglutide-Weight Management 1.7 MG/0.75ML SOAJ Inject 1.7 mg into the skin once a week for 28 days.   [START ON 04/21/2024] Semaglutide-Weight Management 2.4 MG/0.75ML SOAJ Inject 2.4 mg into the skin once a week for 28 days.   traMADol HCl 100 MG TABS Take 100 mg by mouth every 8 (eight) hours as needed (pain).   Vibegron (GEMTESA) 75 MG TABS Take 75 mg by mouth daily.   tacrolimus (PROTOPIC) 0.1 % ointment Apply 1 application topically daily as needed (lupus skin irritation). (Patient not taking: Reported on 12/27/2023)   No facility-administered encounter medications on file as of 12/27/2023.    ALLERGIES: Allergies  Allergen Reactions   Ibuprofen Other (See Comments)    hallucinations Other reaction(s): Delusions (intolerance), Other (See Comments) hallucinations    VACCINATION STATUS: Immunization History  Administered Date(s) Administered   PFIZER(Purple Top)SARS-COV-2 Vaccination 09/12/2019, 06/30/2020     HPI  Candace Robinson is 58 y.o. female who presents today with a medical history as above. she is being seen in follow up after being seen in consultation for hyperthyroidism requested by Vicky Berg, MD.  she was recently hospitalized for complications with nonhealing wounds on her bilateral feet.  She developed altered mental status and thus thyroid  labs were performed showing severely suppressed TSH and high Free T3 levels, in impending thyroid  storm.  She was treated with high dose of Methimazole  30 mg po daily and  sent to the [facility] for rehabilitation where she has since been sent home.  She also had baseline thyroid  ultrasound which did not show any suspicious nodules.  she currently denies dysphagia, choking, shortness of breath, no recent voice change.    she denies family history of thyroid  dysfunction and denies family hx of thyroid  cancer. she denies personal history of goiter.  she is on any anti-thyroid  medications and propanolol for symptom management. Denies use of Biotin containing supplements.    Once her thyroid  labs stabilized, we were able to taper and stop her Methimazole  and proceed with NM uptake and scan which confirmed Graves disease.  She is s/p RAI therapy on 12/11/20.  She has since started on thyroid  hormone replacement therapy.   Review of systems  Constitutional: + fluctuating body weight,  current Body mass index is 37.31 kg/m. , no fatigue, no subjective hyperthermia, no subjective hypothermia Eyes: no blurry vision, no xerophthalmia ENT: no sore throat, no nodules palpated in throat, no dysphagia/odynophagia, no hoarseness Cardiovascular: no chest pain, no shortness of breath, no palpitations, no leg swelling Respiratory: no cough, no shortness of breath Gastrointestinal: no nausea/vomiting/diarrhea Musculoskeletal: walks with walker Skin: no rashes, no hyperemia, bilateral legs in compression wraps Neurological: no tremors, no numbness, no tingling, no dizziness Psychiatric: no depression, no anxiety   Objective:    BP 108/68 (BP Location: Right Arm, Patient Position: Sitting, Cuff Size: Large)   Pulse 84   Ht 5' 3 (1.6 m)   Wt 210 lb 9.6 oz (95.5 kg)   BMI 37.31 kg/m   Wt Readings from Last 3 Encounters:  12/27/23 210 lb 9.6 oz (95.5 kg)  08/21/23 209 lb (94.8 kg)  08/16/23 209 lb 9.6 oz (95.1 kg)     BP Readings from Last 3 Encounters:  12/27/23 108/68  08/21/23 (!) 140/74  08/16/23 136/69     Physical Exam- Limited  Constitutional:  Body mass  index is 37.31 kg/m. , not in acute distress, normal state of mind Eyes:  EOMI, no exophthalmos Musculoskeletal: no gross deformities, strength intact in all four extremities, no gross restriction of joint movements, walks with walker Skin:  no rashes, no hyperemia Neurological: no tremor with outstretched hands   CMP     Component Value Date/Time   NA 137 08/09/2023 1642   K 4.2 08/09/2023 1642   CL 102 08/09/2023 1642   CO2 20 08/09/2023 1642   GLUCOSE 98 08/09/2023 1642   GLUCOSE 93 08/25/2021 1131   BUN 11 08/09/2023 1642   CREATININE 1.13 (H) 08/09/2023 1642   CREATININE 1.80 (H) 07/06/2021 1111   CALCIUM  9.6 08/09/2023 1642   PROT 8.0 08/09/2023 1642   ALBUMIN 3.6 (L) 08/09/2023 1642   AST 19 08/09/2023 1642   ALT 14 08/09/2023 1642   ALKPHOS 104 08/09/2023 1642   BILITOT <0.2 08/09/2023 1642   GFRNONAA >60 08/25/2021 1131     CBC    Component Value Date/Time   WBC 3.8 (L) 08/25/2021 1131   RBC 2.92 (L) 08/25/2021 1131   HGB 7.3 (L) 08/25/2021 1131   HCT 25.1 (L) 08/25/2021 1131   PLT 279 08/25/2021 1131   MCV 86.0 08/25/2021 1131   MCH 25.0 (L) 08/25/2021 1131   MCHC 29.1 (L) 08/25/2021 1131   RDW 18.7 (H) 08/25/2021 1131   LYMPHSABS 783 (L) 07/06/2021 1111   MONOABS 0.4 09/12/2020 0329   EOSABS 17 07/06/2021 1111   BASOSABS 17 07/06/2021 1111     Diabetic Labs (most recent): Lab Results  Component Value Date   HGBA1C 5.2 08/25/2021   HGBA1C 5.6 09/01/2020    Lipid Panel     Component Value Date/Time   CHOL 77 09/01/2020 0342   TRIG 52 09/01/2020 0342   HDL 20 (L) 09/01/2020 0342   CHOLHDL 3.9 09/01/2020 0342   VLDL 10 09/01/2020 0342   LDLCALC 47 09/01/2020  9657     Lab Results  Component Value Date   TSH 0.020 (L) 12/25/2023   TSH 0.103 (L) 08/09/2023   TSH 0.264 (L) 05/01/2023   TSH 4.240 01/09/2023   TSH 2.020 09/08/2022   TSH 0.184 (L) 03/02/2022   TSH 3.110 09/29/2021   TSH 56.600 (H) 07/16/2021   TSH 98.600 (H) 04/13/2021    TSH 0.012 (L) 02/05/2021   FREET4 1.46 12/25/2023   FREET4 1.34 08/09/2023   FREET4 1.46 05/01/2023   FREET4 1.25 01/09/2023   FREET4 1.37 09/08/2022   FREET4 1.53 03/02/2022   FREET4 1.73 09/29/2021   FREET4 0.92 07/16/2021   FREET4 0.22 (L) 04/13/2021   FREET4 1.01 02/05/2021      Latest Reference Range & Units 08/09/23 16:42 12/25/23 08:29  TSH 0.450 - 4.500 uIU/mL 0.103 (L) 0.020 (L)  T4,Free(Direct) 0.82 - 1.77 ng/dL 8.65 8.53  (L): Data is abnormally low  Assessment & Plan:   1. 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    Her previsit thyroid  function tests are consistent with appropriate hormone replacement (TSH still suppressed but FT4 WNL and she denies symptoms of over-replacement).  She is advised to continue her Levothyroxine  100 mcg po daily before breakfast.   - The correct intake of thyroid  hormone (Levothyroxine , Synthroid ), is on empty stomach first thing in the morning, with water, separated by at least 30 minutes from breakfast and other medications,  and separated by more than 4 hours from calcium , iron, multivitamins, acid reflux medications (PPIs).  - This medication is a life-long medication and will be needed to correct thyroid  hormone imbalances for the rest of your life.  The dose may change from time to time, based on thyroid  blood work.  - It is extremely important to be consistent taking this medication, near the same time each morning.  -AVOID TAKING PRODUCTS CONTAINING BIOTIN (commonly found in Hair, Skin, Nails vitamins) AS IT INTERFERES WITH THE VALIDITY OF THYROID  FUNCTION BLOOD TESTS.  2. Weight management She is asking about weight loss medications today.  Her activity level is significantly limited due to other medical issues and would certainly benefit from weight reduction.  She denies personal or family history of thyroid  cancer, no hx of pancreatitis.   I discussed and  initiated Zepbound  2.5 mg at last visit, unfortunately her insurance denied this medication as it does not cover any weight loss medication.  She does note her insurance recently changed and is wondering if this is a covered benefit now.  I sent in for [facility] tapered schedule.   -Patient is advised to maintain close follow up with Vicky Berg, MD for primary care needs.    I spent  31  minutes in the care of the patient today including review of labs from Thyroid  Function, CMP, and other relevant labs ; imaging/biopsy records (current and previous including abstractions from other facilities); face-to-face time discussing  her lab results and symptoms, medications doses, her options of short and long term treatment based on the latest standards of care / guidelines;   and documenting the encounter.  Candace Helling Careaga  participated in the discussions, expressed understanding, and voiced agreement with the above plans.  All questions were answered to her satisfaction. she is encouraged to contact clinic should she have any questions or concerns prior to her return visit.     Follow up plan: Return in about 4 months (around 04/27/2024) for  Thyroid  follow up, Previsit labs.  Benton Rio, [facility] [address] [place], [place] [address] Phone: [phone] Fax: [phone]  12/27/2023, 5:46 PM

## 2023-12-28 NOTE — Telephone Encounter (Signed)
 I was hoping for a different outcome since her insurance changed.  I guess we just need to let the patient know it wasn't approved.

## 2024-04-30 NOTE — Patient Instructions (Incomplete)

## 2024-05-01 ENCOUNTER — Ambulatory Visit: Payer: Medicare (Managed Care) | Admitting: Nurse Practitioner

## 2024-08-05 ENCOUNTER — Ambulatory Visit: Payer: Self-pay | Admitting: Nurse Practitioner

## 2024-09-02 ENCOUNTER — Telehealth: Admitting: Nurse Practitioner
